# Patient Record
Sex: Female | Born: 1945
Health system: Southern US, Community
[De-identification: ages and names within clinical notes are randomized; demographics above are authoritative.]

## PROBLEM LIST (undated history)

## (undated) DIAGNOSIS — I639 Cerebral infarction, unspecified: Secondary | ICD-10-CM

## (undated) DIAGNOSIS — M25569 Pain in unspecified knee: Secondary | ICD-10-CM

## (undated) DIAGNOSIS — I10 Essential (primary) hypertension: Secondary | ICD-10-CM

## (undated) DIAGNOSIS — E119 Type 2 diabetes mellitus without complications: Secondary | ICD-10-CM

## (undated) DIAGNOSIS — M549 Dorsalgia, unspecified: Secondary | ICD-10-CM

## (undated) HISTORY — DX: Cerebral infarction, unspecified: I63.9

---

## 2018-07-09 ENCOUNTER — Encounter (HOSPITAL_COMMUNITY): Payer: Self-pay | Admitting: Emergency Medicine

## 2018-07-09 ENCOUNTER — Emergency Department (HOSPITAL_COMMUNITY): Payer: Medicare Other

## 2018-07-09 ENCOUNTER — Inpatient Hospital Stay (HOSPITAL_COMMUNITY)
Admission: EM | Admit: 2018-07-09 | Discharge: 2018-07-13 | DRG: 065 | Disposition: A | Discharge status: Rehabilitation Facility | Payer: Medicare Other | Attending: Family Medicine | Admitting: Family Medicine

## 2018-07-09 ENCOUNTER — Other Ambulatory Visit: Payer: Self-pay

## 2018-07-09 DIAGNOSIS — E1151 Type 2 diabetes mellitus with diabetic peripheral angiopathy without gangrene: Secondary | ICD-10-CM | POA: Diagnosis present

## 2018-07-09 DIAGNOSIS — R4781 Slurred speech: Secondary | ICD-10-CM | POA: Diagnosis present

## 2018-07-09 DIAGNOSIS — E785 Hyperlipidemia, unspecified: Secondary | ICD-10-CM | POA: Diagnosis present

## 2018-07-09 DIAGNOSIS — Z87891 Personal history of nicotine dependence: Secondary | ICD-10-CM

## 2018-07-09 DIAGNOSIS — E1165 Type 2 diabetes mellitus with hyperglycemia: Secondary | ICD-10-CM | POA: Diagnosis present

## 2018-07-09 DIAGNOSIS — R739 Hyperglycemia, unspecified: Secondary | ICD-10-CM

## 2018-07-09 DIAGNOSIS — G8191 Hemiplegia, unspecified affecting right dominant side: Secondary | ICD-10-CM | POA: Diagnosis present

## 2018-07-09 DIAGNOSIS — I639 Cerebral infarction, unspecified: Secondary | ICD-10-CM | POA: Diagnosis not present

## 2018-07-09 DIAGNOSIS — I160 Hypertensive urgency: Secondary | ICD-10-CM | POA: Diagnosis present

## 2018-07-09 DIAGNOSIS — R29810 Facial weakness: Secondary | ICD-10-CM | POA: Diagnosis present

## 2018-07-09 DIAGNOSIS — R531 Weakness: Secondary | ICD-10-CM

## 2018-07-09 HISTORY — DX: Pain in unspecified knee: M25.569

## 2018-07-09 HISTORY — DX: Dorsalgia, unspecified: M54.9

## 2018-07-09 HISTORY — DX: Essential (primary) hypertension: I10

## 2018-07-09 LAB — DIFFERENTIAL
Abs Immature Granulocytes: 0 10*3/uL (ref 0.0–0.1)
BASOS PCT: 1 %
Basophils Absolute: 0.1 10*3/uL (ref 0.0–0.1)
EOS ABS: 0.3 10*3/uL (ref 0.0–0.7)
EOS PCT: 2 %
Immature Granulocytes: 0 %
Lymphocytes Relative: 24 %
Lymphs Abs: 2.8 10*3/uL (ref 0.7–4.0)
MONO ABS: 0.8 10*3/uL (ref 0.1–1.0)
MONOS PCT: 7 %
NEUTROS PCT: 66 %
Neutro Abs: 7.6 10*3/uL (ref 1.7–7.7)

## 2018-07-09 LAB — COMPREHENSIVE METABOLIC PANEL
ALT: 18 U/L (ref 0–44)
AST: 14 U/L — ABNORMAL LOW (ref 15–41)
Albumin: 3.5 g/dL (ref 3.5–5.0)
Alkaline Phosphatase: 102 U/L (ref 38–126)
Anion gap: 12 (ref 5–15)
BUN: 9 mg/dL (ref 8–23)
CHLORIDE: 103 mmol/L (ref 98–111)
CO2: 25 mmol/L (ref 22–32)
CREATININE: 0.93 mg/dL (ref 0.44–1.00)
Calcium: 9.2 mg/dL (ref 8.9–10.3)
Glucose, Bld: 217 mg/dL — ABNORMAL HIGH (ref 70–99)
POTASSIUM: 3.7 mmol/L (ref 3.5–5.1)
Sodium: 140 mmol/L (ref 135–145)
Total Bilirubin: 1.2 mg/dL (ref 0.3–1.2)
Total Protein: 6.8 g/dL (ref 6.5–8.1)

## 2018-07-09 LAB — I-STAT CHEM 8, ED
BUN: 9 mg/dL (ref 8–23)
CREATININE: 0.8 mg/dL (ref 0.44–1.00)
Calcium, Ion: 1.15 mmol/L (ref 1.15–1.40)
Chloride: 102 mmol/L (ref 98–111)
Glucose, Bld: 211 mg/dL — ABNORMAL HIGH (ref 70–99)
HEMATOCRIT: 46 % (ref 36.0–46.0)
HEMOGLOBIN: 15.6 g/dL — AB (ref 12.0–15.0)
Potassium: 3.6 mmol/L (ref 3.5–5.1)
Sodium: 140 mmol/L (ref 135–145)
TCO2: 27 mmol/L (ref 22–32)

## 2018-07-09 LAB — I-STAT TROPONIN, ED: TROPONIN I, POC: 0.01 ng/mL (ref 0.00–0.08)

## 2018-07-09 LAB — CBC
HCT: 46.7 % — ABNORMAL HIGH (ref 36.0–46.0)
Hemoglobin: 13.5 g/dL (ref 12.0–15.0)
MCH: 21.8 pg — AB (ref 26.0–34.0)
MCHC: 28.9 g/dL — AB (ref 30.0–36.0)
MCV: 75.6 fL — ABNORMAL LOW (ref 78.0–100.0)
PLATELETS: 219 10*3/uL (ref 150–400)
RBC: 6.18 MIL/uL — ABNORMAL HIGH (ref 3.87–5.11)
RDW: 13.5 % (ref 11.5–15.5)
WBC: 11.6 10*3/uL — ABNORMAL HIGH (ref 4.0–10.5)

## 2018-07-09 LAB — PROTIME-INR
INR: 0.97
PROTHROMBIN TIME: 12.8 s (ref 11.4–15.2)

## 2018-07-09 LAB — ETHANOL: Alcohol, Ethyl (B): <10 mg/dL (ref <10)

## 2018-07-09 LAB — CBG MONITORING, ED: Glucose-Capillary: 194 mg/dL — ABNORMAL HIGH (ref 70–99)

## 2018-07-09 LAB — APTT: APTT: 27 s (ref 24–36)

## 2018-07-09 NOTE — ED Provider Notes (Signed)
[facility] Provider Note   CSN: [id number] Arrival date & time: 07/09/18  1939    Level 5 caveat: Language barrier: Translator was used History   Education officer, museumChief Complaint Chief Complaint  Patient presents with  . Stroke Symptoms    HPI Samantha Paul is a 72 y.o. female.  HPI Patient presents to the emergency room for evaluation of right arm and leg weakness.  Patient started having some symptoms since at least Saturday evening.  Patient is having difficulty holding her right arm up.  She also has difficulty moving her leg when she is trying to walk.  Her daughter states that her speech is also somewhat slurred.  She denies any pain.  Denies any injuries.  She denies any numbness.  No history of previous symptoms.  Patient denies any other complaints. History reviewed. No pertinent past medical history.  There are no active problems to display for this patient.   History reviewed. No pertinent surgical history.   OB History   None      Home Medications    Prior to Admission medications   Not on File    Family History No family history on file.  Social History Social History   Tobacco Use  . Smoking status: Never Smoker  . Smokeless tobacco: Never Used  Substance Use Topics  . Alcohol use: Not on file  . Drug use: Not on file     Allergies   Patient has no known allergies.   Review of Systems Review of Systems  All other systems reviewed and are negative.    Physical Exam Updated Vital Signs BP (!) 181/87   Pulse 77   Temp 98.6 F (37 C) (Oral)   Resp 20   SpO2 99%   Physical Exam  Constitutional: She is oriented to person, place, and time. She appears well-developed and well-nourished. No distress.  HENT:  Head: Normocephalic and atraumatic.  Right Ear: External ear normal.  Left Ear: External ear normal.  Mouth/Throat: Oropharynx is clear and moist.  Eyes: Conjunctivae are normal. Right eye exhibits no  discharge. Left eye exhibits no discharge. No scleral icterus.  Neck: Neck supple. No tracheal deviation present.  Cardiovascular: Normal rate, regular rhythm and intact distal pulses.  Pulmonary/Chest: Effort normal and breath sounds normal. No stridor. No respiratory distress. She has no wheezes. She has no rales.  Abdominal: Soft. Bowel sounds are normal. She exhibits no distension. There is no tenderness. There is no rebound and no guarding.  Musculoskeletal: She exhibits no edema or tenderness.  Neurological: She is alert and oriented to person, place, and time. No cranial nerve deficit (no facial droop, extraocular movements intact, no slurred speech) or sensory deficit. She exhibits normal muscle tone. She displays no seizure activity. Coordination normal.  Patient is unable to lift her right arm off the bed, unable to hold right leg off bed for 5 seconds, sensation intact in all extremities, no visual field cuts, no left or right sided neglect, normal finger-nose exam bilaterally, no nystagmus noted   Skin: Skin is warm and dry. No rash noted.  Psychiatric: She has a normal mood and affect.  Nursing note and vitals reviewed.    ED Treatments / Results  Labs (all labs ordered are listed, but only abnormal results are displayed) Labs Reviewed  CBC - Abnormal; Notable for the following components:      Result Value   WBC 11.6 (*)    RBC 6.18 (*)  HCT 46.7 (*)    MCV 75.6 (*)    MCH 21.8 (*)    MCHC 28.9 (*)    All other components within normal limits  COMPREHENSIVE METABOLIC PANEL - Abnormal; Notable for the following components:   Glucose, Bld 217 (*)    AST 14 (*)    All other components within normal limits  CBG MONITORING, ED - Abnormal; Notable for the following components:   Glucose-Capillary 194 (*)    All other components within normal limits  I-STAT CHEM 8, ED - Abnormal; Notable for the following components:   Glucose, Bld 211 (*)    Hemoglobin 15.6 (*)    All  other components within normal limits  PROTIME-INR  APTT  DIFFERENTIAL  ETHANOL  RAPID URINE DRUG SCREEN, HOSP PERFORMED  URINALYSIS, ROUTINE W REFLEX MICROSCOPIC  I-STAT TROPONIN, ED    EKG EKG Interpretation  Date/Time:  Monday July 09 2018 21:14:43 EDT Ventricular Rate:  87 PR Interval:  138 QRS Duration: 74 QT Interval:  372 QTC Calculation: 447 R Axis:   -22 Text Interpretation:  Normal sinus rhythm Nonspecific ST and T wave abnormality Abnormal ECG No old tracing to compare Confirmed by Linwood Dibbles [phone]) on 07/09/2018 9:23:35 PM   Radiology Ct Head Wo Contrast  Result Date: 07/09/2018 CLINICAL DATA:  Patient presents with right arm weakness and right leg weakness with right facial droop. Grand daughter also states that her speech is not her usual. EXAM: CT HEAD WITHOUT CONTRAST TECHNIQUE: Contiguous axial images were obtained from the base of the skull through the vertex without intravenous contrast. COMPARISON:  None. FINDINGS: Brain: Age related involutional changes of the brain. Age-indeterminate hypodensity involving the pons on the left. Age-indeterminate small vessel ischemic disease of periventricular and subcortical white matter. Idiopathic bilateral basal ganglial calcifications are seen. No intra-axial mass nor extra-axial fluid collections. No acute intracranial hemorrhage. Midline fourth ventricle and basal cisterns without effacement. Vascular: No hyperdense vessel sign. Moderate atherosclerosis of the carotid siphons. Skull: Intact Sinuses/Orbits: Nonacute Other: None IMPRESSION: Age-indeterminate infarct of the left pons believed to account for ill-defined hypodensity noted within. Atrophy with age-indeterminate small vessel ischemic disease is also noted though more likely to be chronic given atherosclerosis at the skull base. MRI may prove useful for further correlation. Electronically Signed   By: Tollie Eth M.D.   On: 07/09/2018 23:54    Procedures Procedures  (including critical care time)  Medications Ordered in ED Medications - No data to display   Initial Impression / Assessment and Plan / ED Course  I have reviewed the triage vital signs and the nursing notes.  Pertinent labs & imaging results that were available during my care of the patient were reviewed by me and considered in my medical decision making (see chart for details).  Clinical Course as of Jul 16 0001  Tue Jul 10, 2018  0000 Laboratory tests are notable for hyperglycemia.  CT scan shows age-indeterminate stroke in the left pons.   [JK]    Clinical Course User Index [JK] Linwood Dibbles, MD  Patient presented to the emergency room for evaluation of symptoms concerning for an acute stroke.  CT scan does show findings concerning for stroke.  Patient clearly has deficits of right arm.  She is outside of any intervention window as the symptoms started Saturday evening.  I will consult for admission for further treatment and evaluation.   Final Clinical Impressions(s) / ED Diagnoses   Final diagnoses:  Cerebral infarction, unspecified mechanism (  HCC)      Linwood Dibbles, MD 07/10/18 0001

## 2018-07-09 NOTE — ED Notes (Signed)
Back from CT.  Family is at bedside 

## 2018-07-09 NOTE — ED Triage Notes (Signed)
Patient here with right arm weakness and right leg weakness.  She has been having these symptoms since Saturday evening.  She is unable to hold right arm up, with inability to walk due to right leg weakness.  She denies any pain.  She has right facial droop.  Granddaughter states that her speech is off, not normal.

## 2018-07-10 ENCOUNTER — Inpatient Hospital Stay (HOSPITAL_COMMUNITY): Payer: Medicare Other

## 2018-07-10 ENCOUNTER — Encounter (HOSPITAL_COMMUNITY): Payer: Self-pay | Admitting: Internal Medicine

## 2018-07-10 ENCOUNTER — Other Ambulatory Visit: Payer: Self-pay

## 2018-07-10 DIAGNOSIS — I160 Hypertensive urgency: Secondary | ICD-10-CM

## 2018-07-10 DIAGNOSIS — I5030 Unspecified diastolic (congestive) heart failure: Secondary | ICD-10-CM | POA: Diagnosis not present

## 2018-07-10 DIAGNOSIS — R531 Weakness: Secondary | ICD-10-CM

## 2018-07-10 DIAGNOSIS — I6302 Cerebral infarction due to thrombosis of basilar artery: Secondary | ICD-10-CM | POA: Diagnosis not present

## 2018-07-10 DIAGNOSIS — E785 Hyperlipidemia, unspecified: Secondary | ICD-10-CM

## 2018-07-10 DIAGNOSIS — D62 Acute posthemorrhagic anemia: Secondary | ICD-10-CM | POA: Diagnosis not present

## 2018-07-10 DIAGNOSIS — I6350 Cerebral infarction due to unspecified occlusion or stenosis of unspecified cerebral artery: Secondary | ICD-10-CM | POA: Diagnosis not present

## 2018-07-10 DIAGNOSIS — R29810 Facial weakness: Secondary | ICD-10-CM | POA: Diagnosis present

## 2018-07-10 DIAGNOSIS — Z87891 Personal history of nicotine dependence: Secondary | ICD-10-CM | POA: Diagnosis not present

## 2018-07-10 DIAGNOSIS — R4781 Slurred speech: Secondary | ICD-10-CM | POA: Diagnosis present

## 2018-07-10 DIAGNOSIS — E119 Type 2 diabetes mellitus without complications: Secondary | ICD-10-CM | POA: Diagnosis not present

## 2018-07-10 DIAGNOSIS — R739 Hyperglycemia, unspecified: Secondary | ICD-10-CM

## 2018-07-10 DIAGNOSIS — R195 Other fecal abnormalities: Secondary | ICD-10-CM | POA: Diagnosis not present

## 2018-07-10 DIAGNOSIS — I639 Cerebral infarction, unspecified: Secondary | ICD-10-CM | POA: Diagnosis present

## 2018-07-10 DIAGNOSIS — I69351 Hemiplegia and hemiparesis following cerebral infarction affecting right dominant side: Secondary | ICD-10-CM | POA: Diagnosis not present

## 2018-07-10 DIAGNOSIS — G8191 Hemiplegia, unspecified affecting right dominant side: Secondary | ICD-10-CM | POA: Diagnosis present

## 2018-07-10 DIAGNOSIS — I10 Essential (primary) hypertension: Secondary | ICD-10-CM | POA: Diagnosis not present

## 2018-07-10 DIAGNOSIS — E1165 Type 2 diabetes mellitus with hyperglycemia: Secondary | ICD-10-CM | POA: Diagnosis present

## 2018-07-10 DIAGNOSIS — E1151 Type 2 diabetes mellitus with diabetic peripheral angiopathy without gangrene: Secondary | ICD-10-CM | POA: Diagnosis present

## 2018-07-10 LAB — BASIC METABOLIC PANEL
ANION GAP: 10 (ref 5–15)
BUN: 10 mg/dL (ref 8–23)
CALCIUM: 8.8 mg/dL — AB (ref 8.9–10.3)
CO2: 26 mmol/L (ref 22–32)
Chloride: 106 mmol/L (ref 98–111)
Creatinine, Ser: 0.87 mg/dL (ref 0.44–1.00)
GFR calc Af Amer: >60 mL/min (ref >60)
Glucose, Bld: 211 mg/dL — ABNORMAL HIGH (ref 70–99)
Potassium: 3.7 mmol/L (ref 3.5–5.1)
Sodium: 142 mmol/L (ref 135–145)

## 2018-07-10 LAB — LIPID PANEL
CHOL/HDL RATIO: 5.4 ratio
Cholesterol: 209 mg/dL — ABNORMAL HIGH (ref 0–200)
HDL: 39 mg/dL — AB (ref >40)
LDL CALC: 140 mg/dL — AB (ref 0–99)
Triglycerides: 148 mg/dL (ref <150)
VLDL: 30 mg/dL (ref 0–40)

## 2018-07-10 LAB — GLUCOSE, CAPILLARY
GLUCOSE-CAPILLARY: 222 mg/dL — AB (ref 70–99)
GLUCOSE-CAPILLARY: 234 mg/dL — AB (ref 70–99)
GLUCOSE-CAPILLARY: 213 mg/dL — AB (ref 70–99)
GLUCOSE-CAPILLARY: 273 mg/dL — AB (ref 70–99)
Glucose-Capillary: 181 mg/dL — ABNORMAL HIGH (ref 70–99)

## 2018-07-10 LAB — CBC WITH DIFFERENTIAL/PLATELET
Abs Immature Granulocytes: 0 10*3/uL (ref 0.0–0.1)
BASOS PCT: 1 %
Basophils Absolute: 0.1 10*3/uL (ref 0.0–0.1)
EOS PCT: 3 %
Eosinophils Absolute: 0.3 10*3/uL (ref 0.0–0.7)
HEMATOCRIT: 42.6 % (ref 36.0–46.0)
Hemoglobin: 12.5 g/dL (ref 12.0–15.0)
IMMATURE GRANULOCYTES: 0 %
LYMPHS ABS: 2.4 10*3/uL (ref 0.7–4.0)
Lymphocytes Relative: 25 %
MCH: 22 pg — AB (ref 26.0–34.0)
MCHC: 29.3 g/dL — ABNORMAL LOW (ref 30.0–36.0)
MCV: 75.1 fL — AB (ref 78.0–100.0)
MONO ABS: 0.7 10*3/uL (ref 0.1–1.0)
MONOS PCT: 8 %
Neutro Abs: 6.3 10*3/uL (ref 1.7–7.7)
Neutrophils Relative %: 63 %
Platelets: 174 10*3/uL (ref 150–400)
RBC: 5.67 MIL/uL — ABNORMAL HIGH (ref 3.87–5.11)
RDW: 13.4 % (ref 11.5–15.5)
WBC: 9.8 10*3/uL (ref 4.0–10.5)

## 2018-07-10 LAB — ECHOCARDIOGRAM COMPLETE
HEIGHTINCHES: 58 in
Weight: 2077.62 oz

## 2018-07-10 LAB — MRSA PCR SCREENING: MRSA by PCR: NEGATIVE

## 2018-07-10 LAB — HIV ANTIBODY (ROUTINE TESTING W REFLEX): HIV SCREEN 4TH GENERATION: NONREACTIVE

## 2018-07-10 MED ORDER — ACETAMINOPHEN 160 MG/5ML PO SOLN
650.0000 mg | ORAL | Status: DC | PRN
Start: 2018-07-10 — End: 2018-07-13

## 2018-07-10 MED ORDER — CLOPIDOGREL BISULFATE 75 MG PO TABS
75.0000 mg | ORAL_TABLET | Freq: Every day | ORAL | Status: DC
  Administered 2018-07-10 – 2018-07-13 (×4): 75 mg via ORAL
  Filled 2018-07-10 (×4): qty 1

## 2018-07-10 MED ORDER — SENNOSIDES-DOCUSATE SODIUM 8.6-50 MG PO TABS: 1.0000 | ORAL_TABLET | Freq: Every evening | ORAL | Status: DC | PRN

## 2018-07-10 MED ORDER — STROKE: EARLY STAGES OF RECOVERY BOOK
Freq: Once | Status: AC
  Administered 2018-07-10: 04:00:00
  Filled 2018-07-10: qty 1

## 2018-07-10 MED ORDER — ACETAMINOPHEN 650 MG RE SUPP: 650.0000 mg | RECTAL | Status: DC | PRN

## 2018-07-10 MED ORDER — INSULIN ASPART 100 UNIT/ML ~~LOC~~ SOLN
0.0000 [IU] | Freq: Every day | SUBCUTANEOUS | Status: DC
  Administered 2018-07-10: 5 [IU] via SUBCUTANEOUS
  Administered 2018-07-11: 2 [IU] via SUBCUTANEOUS

## 2018-07-10 MED ORDER — AMLODIPINE BESYLATE 10 MG PO TABS
10.0000 mg | ORAL_TABLET | Freq: Every day | ORAL | Status: DC
  Administered 2018-07-10 – 2018-07-13 (×4): 10 mg via ORAL
  Filled 2018-07-10 (×4): qty 1

## 2018-07-10 MED ORDER — ENOXAPARIN SODIUM 40 MG/0.4ML ~~LOC~~ SOLN
40.0000 mg | SUBCUTANEOUS | Status: DC
  Administered 2018-07-10 – 2018-07-13 (×4): 40 mg via SUBCUTANEOUS
  Filled 2018-07-10 (×4): qty 0.4

## 2018-07-10 MED ORDER — IOPAMIDOL (ISOVUE-370) INJECTION 76%
INTRAVENOUS | Status: AC
  Administered 2018-07-10: 50 mL
  Filled 2018-07-10: qty 50

## 2018-07-10 MED ORDER — INSULIN ASPART 100 UNIT/ML ~~LOC~~ SOLN
0.0000 [IU] | Freq: Three times a day (TID) | SUBCUTANEOUS | Status: DC
  Administered 2018-07-10 (×2): 3 [IU] via SUBCUTANEOUS
  Administered 2018-07-11: 2 [IU] via SUBCUTANEOUS
  Administered 2018-07-11: 5 [IU] via SUBCUTANEOUS
  Administered 2018-07-11: 3 [IU] via SUBCUTANEOUS
  Administered 2018-07-12: 1 [IU] via SUBCUTANEOUS
  Administered 2018-07-12: 2 [IU] via SUBCUTANEOUS
  Administered 2018-07-12: 5 [IU] via SUBCUTANEOUS
  Administered 2018-07-13: 2 [IU] via SUBCUTANEOUS

## 2018-07-10 MED ORDER — SODIUM CHLORIDE 0.9 % IV SOLN
INTRAVENOUS | Status: DC
  Administered 2018-07-10: 75 mL via INTRAVENOUS
  Administered 2018-07-11 – 2018-07-12 (×4): via INTRAVENOUS

## 2018-07-10 MED ORDER — ACETAMINOPHEN 325 MG PO TABS
650.0000 mg | ORAL_TABLET | ORAL | Status: DC | PRN
Start: 2018-07-10 — End: 2018-07-13
  Administered 2018-07-10: 650 mg via ORAL
  Filled 2018-07-10: qty 2

## 2018-07-10 MED ORDER — ASPIRIN 325 MG PO TABS
325.0000 mg | ORAL_TABLET | Freq: Once | ORAL | Status: AC
  Administered 2018-07-10: 325 mg via ORAL
  Filled 2018-07-10: qty 1

## 2018-07-10 MED ORDER — ASPIRIN EC 81 MG PO TBEC
81.0000 mg | DELAYED_RELEASE_TABLET | Freq: Every day | ORAL | Status: DC
  Administered 2018-07-10 – 2018-07-13 (×4): 81 mg via ORAL
  Filled 2018-07-10 (×4): qty 1

## 2018-07-10 MED ORDER — ATORVASTATIN CALCIUM 40 MG PO TABS
40.0000 mg | ORAL_TABLET | Freq: Every day | ORAL | Status: DC
  Administered 2018-07-10 – 2018-07-12 (×3): 40 mg via ORAL
  Filled 2018-07-10 (×3): qty 1

## 2018-07-10 MED ORDER — HYDRALAZINE HCL 20 MG/ML IJ SOLN
10.0000 mg | INTRAMUSCULAR | Status: DC | PRN
  Administered 2018-07-10: 10 mg via INTRAVENOUS
  Filled 2018-07-10: qty 1

## 2018-07-10 NOTE — ED Notes (Signed)
Pt care assumed, obtained verbal report.  Family is at bedside.  Denies complaints

## 2018-07-10 NOTE — Plan of Care (Signed)
  Problem: Education: Goal: Knowledge of secondary prevention will improve Outcome: Progressing   Problem: Coping: Goal: Will identify appropriate support needs Outcome: Progressing

## 2018-07-10 NOTE — Consult Note (Signed)
Referring Physician: Dr. Tamala Julian    Chief Complaint: Right sided weakness  HPI: Samantha Paul is an 72 y.o. female non-English speaking from [place] who presents with 2 day history of right sided weakness. Symptoms began suddenly on Saturday. The family is able to provide some language interpretation. They state that she has had some mild right facial droop and slurred speech, but no difficulty understanding language or putting sentences together.  Per ED note: "Patient presents to the emergency room for evaluation of right arm and leg weakness.  Patient started having some symptoms since at least Saturday evening.  Patient is having difficulty holding her right arm up.  She also has difficulty moving her leg when she is trying to walk.  Her daughter states that her speech is also somewhat slurred.  She denies any pain.  Denies any injuries.  She denies any numbness.  No history of previous symptoms.  Patient denies any other complaints."  History reviewed. No pertinent past medical history.  History reviewed. No pertinent surgical history.  History reviewed. No pertinent family history. Social History:  reports that she has never smoked. She has never used smokeless tobacco. Her alcohol and drug histories are not on file.  Allergies: No Known Allergies  Medications: No home medications per family  ROS: As per HPI.   Physical Examination: Blood pressure (!) 195/74, pulse 88, temperature 98.7 F (37.1 C), resp. rate 17, height 4' 10"  (1.473 m), weight 58.9 kg (129 lb 13.6 oz), SpO2 100 %.  HEENT: [place]/AT Lungs: Respirations unlabored Ext: No edema  Neurologic Examination: Mental Status: Alert, oriented per family providing interpretation. Thought content appropriate.  Speech fluent with intact comprehension to questions and commands. No apparent hemineglect.  Cranial Nerves: II:  Visual fields intact to threat bilaterally. PERRL.  III,IV, VI: EOMI without nystagmus. No ptosis.  V,VII: Subtle  right facial droop. Facial light touch sensation subjectively normal bilaterally. VIII: Hearing intact to voice.  IX,X: No hypophonia XI: Head rotates to left and right XII: midline tongue extension  Motor: RUE 2/5 RLE 4/5 LUE and LLE 5/5 Sensory: Temp and light touch subjectively equal bilaterally. Positive for extinction on the right.  Deep Tendon Reflexes:  1+ reflexes on the right, 2+ on the left.  Plantars: Right: downgoing   Left: downgoing Cerebellar: No ataxia with left FNF. Unable to perform on the right due to weakness.  Gait: Deferred  Results for orders placed or performed during the hospital encounter of 07/09/18 (from the past 48 hour(s))  Protime-INR     Status: None   Collection Time: 07/09/18  9:12 PM  Result Value Ref Range   Prothrombin Time 12.8 11.4 - 15.2 seconds   INR 0.97     Comment: Performed at [facility] [address], [place], [place] [address]  APTT     Status: None   Collection Time: 07/09/18  9:12 PM  Result Value Ref Range   aPTT 27 24 - 36 seconds    Comment: Performed at [facility] [address], [place], [place] [address]  CBC     Status: Abnormal   Collection Time: 07/09/18  9:12 PM  Result Value Ref Range   WBC 11.6 (H) 4.0 - 10.5 K/uL   RBC 6.18 (H) 3.87 - 5.11 MIL/uL   Hemoglobin 13.5 12.0 - 15.0 g/dL   HCT 46.7 (H) 36.0 - 46.0 %   MCV 75.6 (L) 78.0 - 100.0 fL   MCH 21.8 (L) 26.0 -  34.0 pg   MCHC 28.9 (L) 30.0 - 36.0 g/dL   RDW 13.5 11.5 - 15.5 %   Platelets 219 150 - 400 K/uL    Comment: Performed at [facility], [place] [address], [place], [place] [address]  Differential     Status: None   Collection Time: 07/09/18  9:12 PM  Result Value Ref Range   Neutrophils Relative % 66 %   Neutro Abs 7.6 1.7 - 7.7 K/uL   Lymphocytes Relative 24 %   Lymphs Abs 2.8 0.7 - 4.0 K/uL   Monocytes Relative 7 %   Monocytes Absolute 0.8 0.1 - 1.0 K/uL   Eosinophils Relative 2 %   Eosinophils Absolute 0.3 0.0 - 0.7 K/uL    Basophils Relative 1 %   Basophils Absolute 0.1 0.0 - 0.1 K/uL   Immature Granulocytes 0 %   Abs Immature Granulocytes 0.0 0.0 - 0.1 K/uL    Comment: Performed at [facility] [address], [place], [place] [address]  Comprehensive metabolic panel     Status: Abnormal   Collection Time: 07/09/18  9:12 PM  Result Value Ref Range   Sodium 140 135 - 145 mmol/L   Potassium 3.7 3.5 - 5.1 mmol/L   Chloride 103 98 - 111 mmol/L    Comment: Please note change in reference range.   CO2 25 22 - 32 mmol/L   Glucose, Bld 217 (H) 70 - 99 mg/dL    Comment: Please note change in reference range.   BUN 9 8 - 23 mg/dL    Comment: Please note change in reference range.   Creatinine, Ser 0.93 0.44 - 1.00 mg/dL   Calcium 9.2 8.9 - 10.3 mg/dL   Total Protein 6.8 6.5 - 8.1 g/dL   Albumin 3.5 3.5 - 5.0 g/dL   AST 14 (L) 15 - 41 U/L   ALT 18 0 - 44 U/L    Comment: Please note change in reference range.   Alkaline Phosphatase 102 38 - 126 U/L   Total Bilirubin 1.2 0.3 - 1.2 mg/dL   GFR calc non Af Amer NOT CALCULATED >60 mL/min   GFR calc Af Amer NOT CALCULATED >60 mL/min    Comment: (NOTE) The eGFR has been calculated using the CKD EPI equation. This calculation has not been validated in all clinical situations. eGFR's persistently <60 mL/min signify possible Chronic Kidney Disease.    Anion gap 12 5 - 15    Comment: Performed at [facility] [address] [place], [place] [address]  Ethanol     Status: None   Collection Time: 07/09/18  9:15 PM  Result Value Ref Range   Alcohol, Ethyl (B) <10 <10 mg/dL    Comment: (NOTE) Lowest detectable limit for serum alcohol is 10 mg/dL. For medical purposes only. Performed at [facility], [place] [address], [place], [place] [address]   CBG monitoring, ED     Status: Abnormal   Collection Time: 07/09/18  9:23 PM  Result Value Ref Range   Glucose-Capillary 194 (H) 70 - 99 mg/dL  I-stat troponin, ED     Status: None   Collection  Time: 07/09/18  9:28 PM  Result Value Ref Range   Troponin i, poc 0.01 0.00 - 0.08 ng/mL   Comment 3            Comment: Due to the release kinetics of cTnI, a negative result within the first hours of the onset of symptoms  does not rule out myocardial infarction with certainty. If myocardial infarction is still suspected, repeat the test at appropriate intervals.   I-Stat Chem 8, ED     Status: Abnormal   Collection Time: 07/09/18  9:30 PM  Result Value Ref Range   Sodium 140 135 - 145 mmol/L   Potassium 3.6 3.5 - 5.1 mmol/L   Chloride 102 98 - 111 mmol/L   BUN 9 8 - 23 mg/dL   Creatinine, Ser 0.80 0.44 - 1.00 mg/dL   Glucose, Bld 211 (H) 70 - 99 mg/dL   Calcium, Ion 1.15 1.15 - 1.40 mmol/L   TCO2 27 22 - 32 mmol/L   Hemoglobin 15.6 (H) 12.0 - 15.0 g/dL   HCT 46.0 36.0 - 46.0 %  Lipid panel     Status: Abnormal   Collection Time: 07/10/18  3:32 AM  Result Value Ref Range   Cholesterol 209 (H) 0 - 200 mg/dL   Triglycerides 148 <150 mg/dL   HDL 39 (L) >40 mg/dL   Total CHOL/HDL Ratio 5.4 RATIO   VLDL 30 0 - 40 mg/dL   LDL Cholesterol 140 (H) 0 - 99 mg/dL    Comment:        Total Cholesterol/HDL:CHD Risk Coronary Heart Disease Risk Table                     Men   Women  1/2 Average Risk   3.4   3.3  Average Risk       5.0   4.4  2 X Average Risk   9.6   7.1  3 X Average Risk  23.4   11.0        Use the calculated Patient Ratio above and the CHD Risk Table to determine the patient's CHD Risk.        ATP III CLASSIFICATION (LDL):  <100     mg/dL   Optimal  100-129  mg/dL   Near or Above                    Optimal  130-159  mg/dL   Borderline  160-189  mg/dL   High  >190     mg/dL   Very High Performed at [facility] [address] [place], [place] [address]   CBC with Differential/Platelet     Status: Abnormal   Collection Time: 07/10/18  3:32 AM  Result Value Ref Range   WBC 9.8 4.0 - 10.5 K/uL   RBC 5.67 (H) 3.87 - 5.11 MIL/uL   Hemoglobin 12.5 12.0  - 15.0 g/dL   HCT 42.6 36.0 - 46.0 %   MCV 75.1 (L) 78.0 - 100.0 fL   MCH 22.0 (L) 26.0 - 34.0 pg   MCHC 29.3 (L) 30.0 - 36.0 g/dL   RDW 13.4 11.5 - 15.5 %   Platelets 174 150 - 400 K/uL   Neutrophils Relative % 63 %   Neutro Abs 6.3 1.7 - 7.7 K/uL   Lymphocytes Relative 25 %   Lymphs Abs 2.4 0.7 - 4.0 K/uL   Monocytes Relative 8 %   Monocytes Absolute 0.7 0.1 - 1.0 K/uL   Eosinophils Relative 3 %   Eosinophils Absolute 0.3 0.0 - 0.7 K/uL   Basophils Relative 1 %   Basophils Absolute 0.1 0.0 - 0.1 K/uL   Immature Granulocytes 0 %   Abs Immature Granulocytes 0.0 0.0 - 0.1 K/uL    Comment: Performed at [facility], [address] [place], [place] [address]  Basic metabolic panel     Status: Abnormal   Collection Time: 07/10/18  3:32 AM  Result Value Ref Range   Sodium 142 135 - 145 mmol/L   Potassium 3.7 3.5 - 5.1 mmol/L   Chloride 106 98 - 111 mmol/L    Comment: Please note change in reference range.   CO2 26 22 - 32 mmol/L   Glucose, Bld 211 (H) 70 - 99 mg/dL    Comment: Please note change in reference range.   BUN 10 8 - 23 mg/dL    Comment: Please note change in reference range.   Creatinine, Ser 0.87 0.44 - 1.00 mg/dL   Calcium 8.8 (L) 8.9 - 10.3 mg/dL   GFR calc non Af Amer >60 >60 mL/min   GFR calc Af Amer >60 >60 mL/min    Comment: (NOTE) The eGFR has been calculated using the CKD EPI equation. This calculation has not been validated in all clinical situations. eGFR's persistently <60 mL/min signify possible Chronic Kidney Disease.    Anion gap 10 5 - 15    Comment: Performed at [facility] [address], [place], [place] [address]   Dg Chest 2 View  Result Date: 07/10/2018 CLINICAL DATA:  72 year old female with CVA. EXAM: CHEST - 2 VIEW COMPARISON:  None. FINDINGS: The lungs are clear. There is no pleural effusion or pneumothorax. Mild cardiomegaly. Degenerative changes of the shoulders. No acute osseous pathology. IMPRESSION: No active  cardiopulmonary disease. Electronically Signed   By: Anner Crete M.D.   On: 07/10/2018 02:11   Ct Head Wo Contrast  Result Date: 07/09/2018 CLINICAL DATA:  Patient presents with right arm weakness and right leg weakness with right facial droop. Grand daughter also states that her speech is not her usual. EXAM: CT HEAD WITHOUT CONTRAST TECHNIQUE: Contiguous axial images were obtained from the base of the skull through the vertex without intravenous contrast. COMPARISON:  None. FINDINGS: Brain: Age related involutional changes of the brain. Age-indeterminate hypodensity involving the pons on the left. Age-indeterminate small vessel ischemic disease of periventricular and subcortical white matter. Idiopathic bilateral basal ganglial calcifications are seen. No intra-axial mass nor extra-axial fluid collections. No acute intracranial hemorrhage. Midline fourth ventricle and basal cisterns without effacement. Vascular: No hyperdense vessel sign. Moderate atherosclerosis of the carotid siphons. Skull: Intact Sinuses/Orbits: Nonacute Other: None IMPRESSION: Age-indeterminate infarct of the left pons believed to account for ill-defined hypodensity noted within. Atrophy with age-indeterminate small vessel ischemic disease is also noted though more likely to be chronic given atherosclerosis at the skull base. MRI may prove useful for further correlation. Electronically Signed   By: Ashley Royalty M.D.   On: 07/09/2018 23:54    Assessment: 72 y.o. female with two day history of right hemiparesis, arm worse than leg and face 1. Overall clinical picture most consistent with ischemic stroke 2.  CT head reveals an age-indeterminate infarct of the left pons believed to account for ill-defined hypodensity noted within. Atrophy with age-indeterminate small vessel ischemic disease is also noted though more likely to be chronic given atherosclerosis at the skull base.  3. Stroke Risk Factors - No PMHx  Plan: 1. HgbA1c,  fasting lipid panel 2. MRI, MRA of the brain without contrast 3. PT consult, OT consult, Speech consult 4. Echocardiogram 5. Carotid dopplers 6. Prophylactic therapy- ASA 81 mg po qd 7. Risk factor modification 8. Telemetry monitoring 9. Frequent neuro checks 10. Initiate atorvastatin 40 mg  po qd, provided that LFTs and CK are normal.  11. Out of permissive HTN time window.    @Electronically  signed: Dr. Kerney Elbe 07/10/2018, 5:48 AM

## 2018-07-10 NOTE — Progress Notes (Signed)
Patient seen and examined this morning, admitted overnight by Dr. Katrinka Paul, H&P reviewed and agree with the assessment and plan.  In brief this is a Samantha Paul speaking female with no significant past medical history but not having regular medical care who was admitted to the hospital due to inability to move her right arm as well as right leg weakness.  Per family she has had some slurred speech as well.  This is been going on for 2 to 3 days.  She was admitted to the hospital for CVA work-up.  Neurology was consulted   Acute CVA -CT scan showed age-indeterminate left pons infarct, MRI pending -Continue full stroke work-up, appreciate neurology evaluation, MRI, 2D echo, carotid ultrasound pending -Continue aspirin atorvastatin  Hypertensive urgency -Out of the window for permissive hypertension, hydralazine as needed  Hyperglycemia -Unknown if she has diabetes, check A1c   Difficult communication due to language barrier  Samantha M. Elvera LennoxGherghe, MD [facility] [phone]  If 7PM-7AM, please contact night-coverage [email or web address] Password [email or web address]

## 2018-07-10 NOTE — H&P (Signed)
History and Physical    Kyndell Tuckerman AOZ:[id number] DOB: [birth year] DOA: 07/09/2018  Referring MD/NP/PA: Linwood Dibbles, MD PCP: Patient, No Pcp Per  Patient coming from: Home  Chief Complaint: Right-sided weakness  I have personally briefly reviewed patient's old medical records in [facility]   HPI: Emeline Mcgourty is a 72 y.o. [place] speaking female without significant past medical history; who presents with reports of 2 days of right-sided weakness.  History is limited due to language barrier and interpreter services unavailable at this time.  Patient's granddaughter tries to help translate.  Patient has been unable to move her right arm,  has had right leg weakness, unable to walk without assistance, and slurred speech. Other associated symptoms include some joint pains. Patient denies any other complaints.  She does not regularly see a doctor and notes no significant family history of issues reported that her parents died of old age.  ED Course: Upon admission to the emergency department patient was seen to have blood pressure as high as 193/89.  CT scan of the brain revealed an age-indeterminate left pontine infarct.  Labs revealed WBC 11.6 and glucose 217.  Patient was out of the window for TPA.  Review of Systems  Constitutional: Negative for chills, fever and weight loss.  HENT: Negative for ear discharge and nosebleeds.   Eyes: Negative for photophobia and pain.  Respiratory: Negative for cough and hemoptysis.   Cardiovascular: Negative for chest pain and orthopnea.  Gastrointestinal: Negative for abdominal pain, nausea and vomiting.  Genitourinary: Negative for dysuria and hematuria.  Musculoskeletal: Positive for joint pain. Negative for myalgias.  Skin: Negative for itching and rash.  Neurological: Positive for speech change and focal weakness. Negative for weakness.  Psychiatric/Behavioral: Negative for memory loss and substance abuse.    History reviewed. No pertinent past medical  history.  History reviewed. No pertinent surgical history.   reports that she has never smoked. She has never used smokeless tobacco. Her alcohol and drug histories are not on file.  No Known Allergies  History reviewed. No pertinent family history.  Prior to Admission medications   Not on File    Physical Exam:  Constitutional: NAD, calm, comfortable Vitals:   07/09/18 2000 07/09/18 2157 07/09/18 2200 07/09/18 2215  BP: (!) 183/94 (!) 187/79 (!) 193/89 (!) 181/87  Pulse: 82 81 82 77  Resp: 18 17 20 20   Temp: 98.6 F (37 C)     TempSrc: Oral     SpO2: 100% 100% 99% 99%   Eyes: PERRL, lids and conjunctivae normal ENMT: Mucous membranes are moist. Posterior pharynx clear of any exudate or lesions.   Neck: normal, supple, no masses, no thyromegaly Respiratory: clear to auscultation bilaterally, no wheezing, no crackles. Normal respiratory effort. No accessory muscle use.  Cardiovascular: Regular rate and rhythm, no murmurs / rubs / gallops. No extremity edema. 2+ pedal pulses. No carotid bruits.  Abdomen: no tenderness, no masses palpated. No hepatosplenomegaly. Bowel sounds positive.  Musculoskeletal: no clubbing / cyanosis. No joint deformity upper and lower extremities. Good ROM, no contractures. Normal muscle tone.  Skin: no rashes, lesions, ulcers. No induration Neurologic: Right-sided facial droop present, right upper extremity strength 0/5, right lower extremity 3/5, and left upper and lower extremity 5/5. Psychiatric: Normal judgment and insight. Alert and oriented x 3. Normal mood.     Labs on Admission: I have personally reviewed following labs and imaging studies  CBC: Recent Labs  Lab 07/09/18 2112 07/09/18 2130  WBC 11.6*  --  NEUTROABS 7.6  --   HGB 13.5 15.6*  HCT 46.7* 46.0  MCV 75.6*  --   PLT 219  --    Basic Metabolic Panel: Recent Labs  Lab 07/09/18 2112 07/09/18 2130  NA 140 140  K 3.7 3.6  CL 103 102  CO2 25  --   GLUCOSE 217* 211*    BUN 9 9  CREATININE 0.93 0.80  CALCIUM 9.2  --    GFR: CrCl cannot be calculated (Unknown ideal weight.). Liver Function Tests: Recent Labs  Lab 07/09/18 2112  AST 14*  ALT 18  ALKPHOS 102  BILITOT 1.2  PROT 6.8  ALBUMIN 3.5   No results for input(s): LIPASE, AMYLASE in the last 168 hours. No results for input(s): AMMONIA in the last 168 hours. Coagulation Profile: Recent Labs  Lab 07/09/18 2112  INR 0.97   Cardiac Enzymes: No results for input(s): CKTOTAL, CKMB, CKMBINDEX, TROPONINI in the last 168 hours. BNP (last 3 results) No results for input(s): PROBNP in the last 8760 hours. HbA1C: No results for input(s): HGBA1C in the last 72 hours. CBG: Recent Labs  Lab 07/09/18 2123  GLUCAP 194*   Lipid Profile: No results for input(s): CHOL, HDL, LDLCALC, TRIG, CHOLHDL, LDLDIRECT in the last 72 hours. Thyroid Function Tests: No results for input(s): TSH, T4TOTAL, FREET4, T3FREE, THYROIDAB in the last 72 hours. Anemia Panel: No results for input(s): VITAMINB12, FOLATE, FERRITIN, TIBC, IRON, RETICCTPCT in the last 72 hours. Urine analysis: No results found for: COLORURINE, APPEARANCEUR, LABSPEC, PHURINE, GLUCOSEU, HGBUR, BILIRUBINUR, KETONESUR, PROTEINUR, UROBILINOGEN, NITRITE, LEUKOCYTESUR Sepsis Labs: No results found for this or any previous visit (from the past 240 hour(s)).   Radiological Exams on Admission: Ct Head Wo Contrast  Result Date: 07/09/2018 CLINICAL DATA:  Patient presents with right arm weakness and right leg weakness with right facial droop. Grand daughter also states that her speech is not her usual. EXAM: CT HEAD WITHOUT CONTRAST TECHNIQUE: Contiguous axial images were obtained from the base of the skull through the vertex without intravenous contrast. COMPARISON:  None. FINDINGS: Brain: Age related involutional changes of the brain. Age-indeterminate hypodensity involving the pons on the left. Age-indeterminate small vessel ischemic disease of  periventricular and subcortical white matter. Idiopathic bilateral basal ganglial calcifications are seen. No intra-axial mass nor extra-axial fluid collections. No acute intracranial hemorrhage. Midline fourth ventricle and basal cisterns without effacement. Vascular: No hyperdense vessel sign. Moderate atherosclerosis of the carotid siphons. Skull: Intact Sinuses/Orbits: Nonacute Other: None IMPRESSION: Age-indeterminate infarct of the left pons believed to account for ill-defined hypodensity noted within. Atrophy with age-indeterminate small vessel ischemic disease is also noted though more likely to be chronic given atherosclerosis at the skull base. MRI may prove useful for further correlation. Electronically Signed   By: Tollie Eth M.D.   On: 07/09/2018 23:54    EKG: Independently reviewed.  Sinus rhythm at 87 bpm  Assessment/Plan CVA, right sided weakness: subacute.  Patient presents with a 2-day history of right-sided weakness worse in the right arm than the right leg.  CT scan showing age-indeterminate left pons infarct. - Admit to telemetry bed - Stroke order set initiated - Neuro checks - Check  MRI/MRA head w/o contrast  - Vas carotid U/S - PT/OT/Speech to eval and treat - Check echocardiogram - Check Hemoglobin A1c and lipid panel in a.m. - ASA - Atorvastatin - Social work consult  - Appreciate Dr. Ernestene Kiel of neurology consultative services, will follow-up for further recommendation  Hypertensive urgency: Patient out  of the window for permissive hypertension.  Blood pressures noted to be as high as 193/89. - Hydralazine IV prn sBP>180 or dBP>110  Hyperglycemia: Acute. Initial glucose 217 on admission.  Patient reports no history of diabetes. - Hypoglycemic protocols - Follow-up hemoglobin A1c - CBGs q. before meals and at bedtime with sensitive SSI      DVT prophylaxis: lovenox  Code Status: Full Family Communication: Discussed plan of care with the patient family  present at bedside Disposition Plan: TBD  Consults called: Neurology  Admission status: inpatient  Clydie Braunondell A Juvia Aerts MD [facility] Pager [phone]   If 7PM-7AM, please contact night-coverage [email or web address] Password [email or web address]  07/10/2018, 12:24 AM

## 2018-07-10 NOTE — Progress Notes (Signed)
PT Cancellation Note  Patient Details Name: Samantha GuttingHpot Paul MRN: [id number] DOB: Oct 06, 1946   Cancelled Treatment:    Reason Eval/Treat Not Completed: Patient at procedure or test/unavailable   Jaynee Winters B Naveh Rickles 07/10/2018, 8:58 AM  Delaney MeigsMaija Tabor Tequan Redmon, PT [phone]

## 2018-07-10 NOTE — Progress Notes (Signed)
Inpatient Diabetes Program Recommendations  AACE/ADA: New Consensus Statement on Inpatient Glycemic Control (2015)  Target Ranges:  Prepandial:   less than 140 mg/dL      Peak postprandial:   less than 180 mg/dL (1-2 hours)      Critically ill patients:  140 - 180 mg/dL   Lab Results  Component Value Date   GLUCAP 213 (H) 07/10/2018    Review of Glycemic ControlResults for Samantha Paul, [name] (MRN [id number]) as of 07/10/2018 15:21  Ref. Range 07/09/2018 21:23 07/10/2018 04:14 07/10/2018 07:57 07/10/2018 11:57  Glucose-Capillary Latest Ref Range: 70 - 99 mg/dL 409194 (H) 811222 (H) 914234 (H) 213 (H)    Diabetes history: None-A1C pending Outpatient Diabetes medications: None Current orders for Inpatient glycemic control:  Novolog sensitive tid with meals and HS Inpatient Diabetes Program Recommendations:   Note Novolog correction added.  A1C pending.  ? New diagnosis.  Will follow.   Thanks,  Beryl MeagerJenny Rosmary Dionisio, RN, BC-ADM Inpatient Diabetes Coordinator Pager [phone] (8a-5p)

## 2018-07-10 NOTE — Progress Notes (Signed)
SLP Cancellation Note  Patient Details Name: Samantha Paul MRN: [id number] DOB: [birth year]   Cancelled treatment:       Reason Eval/Treat Not Completed: Patient at procedure or test/unavailable; pt being transported to radiology.  Will continue efforts.   Blenda MountsCouture, Jianna Drabik Laurice 07/10/2018, 12:01 PM

## 2018-07-10 NOTE — Progress Notes (Signed)
STROKE TEAM PROGRESS NOTE   SUBJECTIVE (INTERVAL HISTORY) Her son and husband are at the bedside.  Overall she feels her condition is gradually improving. Her son served as Engineer, technical sales. Pt still has right hemiparesis, but improved. MRI confirmed left pontine infarct, CTA head and neck unremarkable.    OBJECTIVE Temp:  [98.2 F (36.8 C)-98.7 F (37.1 C)] 98.2 F (36.8 C) (07/16 0730) Pulse Rate:  [61-93] 89 (07/16 1200) Cardiac Rhythm: Normal sinus rhythm (07/16 0700) Resp:  [9-31] 16 (07/16 1200) BP: (125-195)/(70-164) 165/88 (07/16 1200) SpO2:  [94 %-100 %] 98 % (07/16 1200) Weight:  [129 lb 13.6 oz (58.9 kg)] 129 lb 13.6 oz (58.9 kg) (07/16 0436)  Recent Labs  Lab 07/09/18 2123 07/10/18 0414 07/10/18 0757 07/10/18 1157  GLUCAP 194* 222* 234* 213*   Recent Labs  Lab 07/09/18 2112 07/09/18 2130 07/10/18 0332  NA 140 140 142  K 3.7 3.6 3.7  CL 103 102 106  CO2 25  --  26  GLUCOSE 217* 211* 211*  BUN 9 9 10   CREATININE 0.93 0.80 0.87  CALCIUM 9.2  --  8.8*   Recent Labs  Lab 07/09/18 2112  AST 14*  ALT 18  ALKPHOS 102  BILITOT 1.2  PROT 6.8  ALBUMIN 3.5   Recent Labs  Lab 07/09/18 2112 07/09/18 2130 07/10/18 0332  WBC 11.6*  --  9.8  NEUTROABS 7.6  --  6.3  HGB 13.5 15.6* 12.5  HCT 46.7* 46.0 42.6  MCV 75.6*  --  75.1*  PLT 219  --  174   No results for input(s): CKTOTAL, CKMB, CKMBINDEX, TROPONINI in the last 168 hours. Recent Labs    07/09/18 2112  LABPROT 12.8  INR 0.97   No results for input(s): COLORURINE, LABSPEC, PHURINE, GLUCOSEU, HGBUR, BILIRUBINUR, KETONESUR, PROTEINUR, UROBILINOGEN, NITRITE, LEUKOCYTESUR in the last 72 hours.  Invalid input(s): APPERANCEUR     Component Value Date/Time   CHOL 209 (H) 07/10/2018 0332   TRIG 148 07/10/2018 0332   HDL 39 (L) 07/10/2018 0332   CHOLHDL 5.4 07/10/2018 0332   VLDL 30 07/10/2018 0332   LDLCALC 140 (H) 07/10/2018 0332   No results found for: HGBA1C No results found for: LABOPIA,  COCAINSCRNUR, LABBENZ, AMPHETMU, THCU, LABBARB  Recent Labs  Lab 07/09/18 2115  ETH <10    I have personally reviewed the radiological images below and agree with the radiology interpretations.  Ct Angio Head W Or Wo Contrast  Result Date: 07/10/2018 CLINICAL DATA:  Right arm weakness and right leg weakness. Symptoms began about 3 days ago. Abnormal brainstem by CT. EXAM: CT ANGIOGRAPHY HEAD AND NECK TECHNIQUE: Multidetector CT imaging of the head and neck was performed using the standard protocol during bolus administration of intravenous contrast. Multiplanar CT image reconstructions and MIPs were obtained to evaluate the vascular anatomy. Carotid stenosis measurements (when applicable) are obtained utilizing NASCET criteria, using the distal internal carotid diameter as the denominator. CONTRAST:  50mL ISOVUE-370 IOPAMIDOL (ISOVUE-370) INJECTION 76% COMPARISON:  CT done yesterday. FINDINGS: CTA NECK FINDINGS Aortic arch: The aortic arch appears normal. Branching pattern of the brachiocephalic vessels is normal without origin stenosis. Right carotid system: Common carotid artery is tortuous but widely patent to the bifurcation. The carotid bifurcation does not show any plaque, stenosis or irregularity. Cervical ICA is tortuous but widely patent. Left carotid system: Left common carotid artery is widely patent to the bifurcation. No carotid bifurcation atherosclerotic disease. Cervical ICA is tortuous but widely patent. Vertebral arteries: The right  vertebral artery is dominant. Both vertebral artery origins are widely patent. Both vertebral arteries appear normal through the cervical region to the foramen magnum. Skeleton: Ordinary mid cervical spondylosis. Other neck: No soft tissue mass or lymphadenopathy. Upper chest: Negative Review of the MIP images confirms the above findings CTA HEAD FINDINGS Anterior circulation: There is atherosclerotic calcification in both carotid siphon regions but no  stenosis more than about 20 or 30%. The anterior and middle cerebral vessels are patent without proximal stenosis, aneurysm or vascular malformation. Distal branch vessels appear unremarkable. Posterior circulation: Both vertebral arteries are patent through the foramen magnum to the basilar. Dominant supply is from the right. Posterior inferior cerebellar arteries are patent. The basilar artery shows mild atherosclerotic irregularity but does not have a flow limiting stenosis. Superior cerebellar and posterior cerebral arteries appear normal. Right PCA takes fetal origin. Venous sinuses: Patent and normal. Anatomic variants: None significant. Delayed phase: No abnormal enhancement. Review of the MIP images confirms the above findings IMPRESSION: Carotid bifurcations appear normal. No anterior circulation disease of significance. Tortuous vessels suggesting a history of hypertension. Mild atherosclerotic irregularity of the basilar artery but without flow limiting stenosis. Electronically Signed   By: Paulina Fusi M.D.   On: 07/10/2018 14:12   Dg Chest 2 View  Result Date: 07/10/2018 CLINICAL DATA:  72 year old female with CVA. EXAM: CHEST - 2 VIEW COMPARISON:  None. FINDINGS: The lungs are clear. There is no pleural effusion or pneumothorax. Mild cardiomegaly. Degenerative changes of the shoulders. No acute osseous pathology. IMPRESSION: No active cardiopulmonary disease. Electronically Signed   By: Elgie Collard M.D.   On: 07/10/2018 02:11   Ct Head Wo Contrast  Result Date: 07/09/2018 CLINICAL DATA:  Patient presents with right arm weakness and right leg weakness with right facial droop. Grand daughter also states that her speech is not her usual. EXAM: CT HEAD WITHOUT CONTRAST TECHNIQUE: Contiguous axial images were obtained from the base of the skull through the vertex without intravenous contrast. COMPARISON:  None. FINDINGS: Brain: Age related involutional changes of the brain. Age-indeterminate  hypodensity involving the pons on the left. Age-indeterminate small vessel ischemic disease of periventricular and subcortical white matter. Idiopathic bilateral basal ganglial calcifications are seen. No intra-axial mass nor extra-axial fluid collections. No acute intracranial hemorrhage. Midline fourth ventricle and basal cisterns without effacement. Vascular: No hyperdense vessel sign. Moderate atherosclerosis of the carotid siphons. Skull: Intact Sinuses/Orbits: Nonacute Other: None IMPRESSION: Age-indeterminate infarct of the left pons believed to account for ill-defined hypodensity noted within. Atrophy with age-indeterminate small vessel ischemic disease is also noted though more likely to be chronic given atherosclerosis at the skull base. MRI may prove useful for further correlation. Electronically Signed   By: Tollie Eth M.D.   On: 07/09/2018 23:54   Ct Angio Neck W Or Wo Contrast  Result Date: 07/10/2018 CLINICAL DATA:  Right arm weakness and right leg weakness. Symptoms began about 3 days ago. Abnormal brainstem by CT. EXAM: CT ANGIOGRAPHY HEAD AND NECK TECHNIQUE: Multidetector CT imaging of the head and neck was performed using the standard protocol during bolus administration of intravenous contrast. Multiplanar CT image reconstructions and MIPs were obtained to evaluate the vascular anatomy. Carotid stenosis measurements (when applicable) are obtained utilizing NASCET criteria, using the distal internal carotid diameter as the denominator. CONTRAST:  50mL ISOVUE-370 IOPAMIDOL (ISOVUE-370) INJECTION 76% COMPARISON:  CT done yesterday. FINDINGS: CTA NECK FINDINGS Aortic arch: The aortic arch appears normal. Branching pattern of the brachiocephalic vessels is normal without  origin stenosis. Right carotid system: Common carotid artery is tortuous but widely patent to the bifurcation. The carotid bifurcation does not show any plaque, stenosis or irregularity. Cervical ICA is tortuous but widely  patent. Left carotid system: Left common carotid artery is widely patent to the bifurcation. No carotid bifurcation atherosclerotic disease. Cervical ICA is tortuous but widely patent. Vertebral arteries: The right vertebral artery is dominant. Both vertebral artery origins are widely patent. Both vertebral arteries appear normal through the cervical region to the foramen magnum. Skeleton: Ordinary mid cervical spondylosis. Other neck: No soft tissue mass or lymphadenopathy. Upper chest: Negative Review of the MIP images confirms the above findings CTA HEAD FINDINGS Anterior circulation: There is atherosclerotic calcification in both carotid siphon regions but no stenosis more than about 20 or 30%. The anterior and middle cerebral vessels are patent without proximal stenosis, aneurysm or vascular malformation. Distal branch vessels appear unremarkable. Posterior circulation: Both vertebral arteries are patent through the foramen magnum to the basilar. Dominant supply is from the right. Posterior inferior cerebellar arteries are patent. The basilar artery shows mild atherosclerotic irregularity but does not have a flow limiting stenosis. Superior cerebellar and posterior cerebral arteries appear normal. Right PCA takes fetal origin. Venous sinuses: Patent and normal. Anatomic variants: None significant. Delayed phase: No abnormal enhancement. Review of the MIP images confirms the above findings IMPRESSION: Carotid bifurcations appear normal. No anterior circulation disease of significance. Tortuous vessels suggesting a history of hypertension. Mild atherosclerotic irregularity of the basilar artery but without flow limiting stenosis. Electronically Signed   By: Paulina FusiMark  Shogry M.D.   On: 07/10/2018 14:12   Mr Brain Wo Contrast  Result Date: 07/10/2018 CLINICAL DATA:  Right arm and leg weakness, acute onset. Brainstem stroke. EXAM: MRI HEAD WITHOUT CONTRAST TECHNIQUE: Multiplanar, multiecho pulse sequences of the  brain and surrounding structures were obtained without intravenous contrast. COMPARISON:  CT angiography same day.  CT head yesterday. FINDINGS: Brain: Diffusion imaging shows a 1 x 2 cm acute infarction in the left para median pons. No other acute infarction. No old brainstem insult is identified. There is an old right cerebellar infarction. Cerebral hemispheres show an old lacunar infarction in the right caudate head and mild small vessel change of the hemispheric white matter. No large vessel territory infarction. No mass lesion, hemorrhage, hydrocephalus or extra-axial collection. Vascular: Major vessels at the base of the brain show flow. Skull and upper cervical spine: Negative Sinuses/Orbits: Clear/normal Other: None IMPRESSION: 1 x 2 cm acute infarction in the left para median pons. No evidence of hemorrhage or mass effect. Mild chronic small-vessel change elsewhere as above. Electronically Signed   By: Paulina FusiMark  Shogry M.D.   On: 07/10/2018 14:24    TTE pending   PHYSICAL EXAM  Temp:  [98.2 F (36.8 C)-98.7 F (37.1 C)] 98.2 F (36.8 C) (07/16 0730) Pulse Rate:  [61-93] 89 (07/16 1200) Resp:  [9-31] 16 (07/16 1200) BP: (125-195)/(70-164) 165/88 (07/16 1200) SpO2:  [94 %-100 %] 98 % (07/16 1200) Weight:  [129 lb 13.6 oz (58.9 kg)] 129 lb 13.6 oz (58.9 kg) (07/16 0436)  General - Well nourished, well developed, in no apparent distress.  Ophthalmologic - fundi not visualized due to noncooperation.  Cardiovascular - Regular rate and rhythm with no murmur.  Mental Status -  Level of arousal and orientation to time, place, and person were intact. Language including expression, naming, repetition, comprehension was assessed and found intact. Fund of Knowledge was assessed and was intact.  Cranial Nerves  II - XII - II - Visual field intact OU. III, IV, VI - Extraocular movements intact. V - Facial sensation intact bilaterally. VII - right facial droop. VIII - Hearing & vestibular  intact bilaterally X - Palate elevates symmetrically. XI - Chin turning & shoulder shrug intact bilaterally. XII - Tongue protrusion intact.  Motor Strength - The patient's strength was normal in LUE and LLE, but RUE 2+/5 deltoid and bicep and tricep and RLE 4-/5 proximal and 3/5 distally.  Bulk was normal and fasciculations were absent.   Motor Tone - Muscle tone was assessed at the neck and appendages and was normal.  Reflexes - The patient's reflexes were symmetrical in all extremities and she had no pathological reflexes.  Sensory - Light touch, temperature/pinprick were assessed and were symmetrical.    Coordination - The patient had normal movements in the left hand with no ataxia or dysmetria.  Tremor was absent.  Gait and Station - deferred.   ASSESSMENT/PLAN [name] is a 72 y.o. female with no significant hx admitted for right sided weakness and right facial droop. No tPA given due to OSW.    Stroke:  left pontine infarct likely secondary to small vessel disease source  Resultant right hemiparesis  MRI  Left pontine infarct  CTA head and neck unremarkable  2D Echo  pending  LDL 140  HgbA1c pending  lovenox for VTE prophylaxis  No antithrombotic prior to admission, now on aspirin 81 mg daily and clopidogrel 75 mg daily. Continue DAPT for 3 weeks and then either ASA or plavix alone  Patient counseled to be compliant with her antithrombotic medications  Ongoing aggressive stroke risk factor management  Therapy recommendations:  CIR  Disposition:  pending  Hypertension On the high side Permissive hypertension (OK if <220/120) for 24-48 hours post stroke and then gradually normalized within 5-7 days. Put on amlodipine  Long term BP goal normotensive  Hyperlipidemia  Home meds:  none   LDL 140, goal < 70  Now on lipitor 40  Continue statin at discharge  Other Stroke Risk Factors  Advanced age  Other Active Problems  Hyperglycemia - A1C  pending  Hospital day # 0   Marvel Plan, MD PhD Stroke Neurology 07/10/2018 3:18 PM    To contact Stroke Continuity provider, please refer to [email or web address]. After hours, contact General Neurology

## 2018-07-10 NOTE — ED Notes (Signed)
Patient transported to X-ray via stretcher 

## 2018-07-10 NOTE — Social Work (Signed)
CSW acknowledging consult for SNF placement, current recommendations are for CIR, will follow for any updates in disposition.   Samantha Paul, LCSWA [facility] Clinical Social Work [phone]

## 2018-07-10 NOTE — Evaluation (Signed)
Physical Therapy Evaluation Patient Details Name: Samantha Paul MRN: [id number] DOB: [birth year] Today's Date: 07/10/2018   History of Present Illness  72 yo vietnamese female admitted with right weakness with CT demonstrating age indeterminate left pons infarct. No PMHx  Clinical Impression  Pt pleasant in bed, family present. No interpreter present via phone or ipad for dialect and son as well as spouse present in room to provide home setup, PLOF and direct pt. Pt with grossly intact light touch with noted impaired RUE proprioception as well as strength. Pt with RHD, does not speak English and was unaware of concern for her deficits when they occurred on Saturday. Pt with decreased balance, strength, function, sensation and mobility who will benefit from acute therapy to maximize mobility, safety and function. Pt would greatly benefit from CIR given additional barrier of language to reach mod I- supervision prior to return home.     Follow Up Recommendations CIR;Supervision/Assistance - 24 hour    Equipment Recommendations  Other (comment);3in1 (PT)(quad cane)    Recommendations for Other Services OT consult     Precautions / Restrictions Precautions Precautions: Fall Precaution Comments: right hemiparesis Restrictions Weight Bearing Restrictions: No      Mobility  Bed Mobility Overal bed mobility: Needs Assistance Bed Mobility: Supine to Sit;Sit to Supine     Supine to sit: Min assist Sit to supine: Min assist   General bed mobility comments: assist to elevate trunk from supine and assist to bring RLE onto bed to return to supine  Transfers Overall transfer level: Needs assistance   Transfers: Sit to/from Stand Sit to Stand: Min assist         General transfer comment: min assist to stand from bed and chair with LUE assist and guarding for balance with belt  Ambulation/Gait Ambulation/Gait assistance: Mod assist Gait Distance (Feet): 25 Feet Assistive device: 1 person  hand held assist Gait Pattern/deviations: Step-to pattern;Decreased dorsiflexion - right   Gait velocity interpretation: <1.8 ft/sec, indicate of risk for recurrent falls General Gait Details: pt with hemiparetic gait sliding RLE but able to tolerate weight on RLE with stepping LLE. Pt with LUE assist for balance and weight shift with improved stepping with LUE support  Stairs            Wheelchair Mobility    Modified Rankin (Stroke Patients Only) Modified Rankin (Stroke Patients Only) Pre-Morbid Rankin Score: No symptoms Modified Rankin: Moderately severe disability     Balance Overall balance assessment: Needs assistance   Sitting balance-Leahy Scale: Good       Standing balance-Leahy Scale: Poor Standing balance comment: LUE support in standing                             Pertinent Vitals/Pain Pain Assessment: No/denies pain    Home Living Family/patient expects to be discharged to:: Private residence Living Arrangements: Spouse/significant other Available Help at Discharge: Family;Available 24 hours/day Type of Home: House Home Access: Stairs to enter   [employer] of Steps: 2 Home Layout: One level Home Equipment: None      Prior Function Level of Independence: Independent               Hand Dominance        Extremity/Trunk Assessment   Upper Extremity Assessment Upper Extremity Assessment: RUE deficits/detail RUE Deficits / Details: pt with limited strength grossly 2-/5 elbow flexion very limited shoulder flexion, grossly intact light touch with limited proprioception  Lower Extremity Assessment Lower Extremity Assessment: RLE deficits/detail RLE Deficits / Details: grossly 2/5 but difficult to fully assess due to language barrier    Cervical / Trunk Assessment Cervical / Trunk Assessment: Other exceptions Cervical / Trunk Exceptions: forward head, rounded shoulders  Communication   Communication: Prefers  language other than English(montagnard- Samantha Paul)  Cognition Arousal/Alertness: Awake/alert Behavior During Therapy: WFL for tasks assessed/performed Overall Cognitive Status: Impaired/Different from baseline Area of Impairment: Orientation;Safety/judgement                 Orientation Level: Disoriented to;Person       Safety/Judgement: Decreased awareness of deficits;Decreased awareness of safety     General Comments: pt not aware of birthdate but per family that is normal      General Comments      Exercises     Assessment/Plan    PT Assessment Patient needs continued PT services  PT Problem List Decreased strength;Decreased mobility;Decreased safety awareness;Decreased range of motion;Decreased coordination;Decreased activity tolerance;Decreased cognition;Decreased balance;Decreased knowledge of use of DME;Impaired sensation       PT Treatment Interventions Gait training;Therapeutic exercise;Patient/family education;DME instruction;Therapeutic activities;Cognitive remediation;Balance training;Functional mobility training;Neuromuscular re-education;Stair training    PT Goals (Current goals can be found in the Care Plan section)  Acute Rehab PT Goals Patient Stated Goal: return home PT Goal Formulation: With patient/family Time For Goal Achievement: 07/24/18 Potential to Achieve Goals: Good    Frequency Min 4X/week   Barriers to discharge        Co-evaluation               AM-PAC PT "6 Clicks" Daily Activity  Outcome Measure Difficulty turning over in bed (including adjusting bedclothes, sheets and blankets)?: A Lot Difficulty moving from lying on back to sitting on the side of the bed? : Unable Difficulty sitting down on and standing up from a chair with arms (e.g., wheelchair, bedside commode, etc,.)?: Unable Help needed moving to and from a bed to chair (including a wheelchair)?: A Lot Help needed walking in hospital room?: A Lot Help needed climbing  3-5 steps with a railing? : A Lot 6 Click Score: 10    End of Session Equipment Utilized During Treatment: Gait belt Activity Tolerance: Patient tolerated treatment well Patient left: in bed;with call bell/phone within reach;with family/visitor present;Other (comment)(transporter present for test) Nurse Communication: Mobility status;Precautions PT Visit Diagnosis: Other abnormalities of gait and mobility (R26.89);Other symptoms and signs involving the nervous system (R29.898);Hemiplegia and hemiparesis;Unsteadiness on feet (R26.81) Hemiplegia - Right/Left: Right Hemiplegia - dominant/non-dominant: Dominant Hemiplegia - caused by: Cerebral infarction    Time: 7829-56211008-1032 PT Time Calculation (min) (ACUTE ONLY): 24 min   Charges:   PT Evaluation $PT Eval Moderate Complexity: 1 Mod     PT G Codes:        Samantha Paul, PT [phone]   Samantha Paul 07/10/2018, 12:26 PM

## 2018-07-10 NOTE — ED Notes (Signed)
Report called to 4NP by Teup, RN

## 2018-07-10 NOTE — Progress Notes (Signed)
  Echocardiogram 2D Echocardiogram has been performed.  Makail Watling L Androw 07/10/2018, 11:17 AM

## 2018-07-10 NOTE — Evaluation (Signed)
Occupational Therapy Evaluation Patient Details Name: Samantha Paul MRN: [id number] DOB: [birth year] Today's Date: 07/10/2018    History of Present Illness 72 yo viet[place] female admitted with right weakness with CT demonstrating age indeterminate left pons infarct. No PMHx   Clinical Impression   Son translating throughout session; he reports pt independent with ADL and mobility PTA. Currently pt mod assist for functional mobility and mod-max assist for ADL. Pt presenting with R sided weakness, impaired gross/fine motor coordination, poor standing balance impacting her independence and safety with ADL and functional mobility. Recommending CIR level therapies to maximize independence and safety with ADL and functional mobility prior to return home. Pt would benefit from continued skilled OT to address established goals.    Follow Up Recommendations  CIR;Supervision/Assistance - 24 hour    Equipment Recommendations  Other (comment)(TBD at next venue)    Recommendations for Other Services       Precautions / Restrictions Precautions Precautions: Fall Precaution Comments: right hemiparesis Restrictions Weight Bearing Restrictions: No      Mobility Bed Mobility Overal bed mobility: Needs Assistance Bed Mobility: Supine to Sit;Sit to Supine     Supine to sit: Min assist Sit to supine: Min assist   General bed mobility comments: Min assist for trunk elevation to sitting, assist for LEs back to bed. HOB flat without use of bed rails  Transfers Overall transfer level: Needs assistance Equipment used: 1 person hand held assist Transfers: Sit to/from Stand Sit to Stand: Min assist         General transfer comment: Min assist to boost up from EOB and for standing balance. Provided HHA thoruhgout    Balance Overall balance assessment: Needs assistance Sitting-balance support: Feet unsupported Sitting balance-Leahy Scale: Good     Standing balance support: Single extremity  supported Standing balance-Leahy Scale: Poor                             ADL either performed or assessed with clinical judgement   ADL Overall ADL's : Needs assistance/impaired Eating/Feeding: Minimal assistance;Sitting Eating/Feeding Details (indicate cue type and reason): Pt reporting she has been self feeding with L hand Grooming: Moderate assistance;Sitting   Upper Body Bathing: Moderate assistance;Sitting   Lower Body Bathing: Maximal assistance;Sit to/from stand   Upper Body Dressing : Moderate assistance;Sitting   Lower Body Dressing: Maximal assistance;Sit to/from stand   Toilet Transfer: Moderate assistance;Ambulation Toilet Transfer Details (indicate cue type and reason): HHA, simulated by functional mobility in room         Functional mobility during ADLs: Moderate assistance(HHA)       Vision Baseline Vision/History: Wears glasses Wears Glasses: Reading only Patient Visual Report: No change from baseline Additional Comments: Needs further assessment     Perception     Praxis      Pertinent Vitals/Pain Pain Assessment: No/denies pain     Hand Dominance Right   Extremity/Trunk Assessment Upper Extremity Assessment Upper Extremity Assessment: RUE deficits/detail RUE Deficits / Details: pt with limited strength grossly 2-/5 elbow flexion very limited shoulder flexion, grossly intact light touch with limited proprioception RUE Sensation: decreased proprioception RUE Coordination: decreased fine motor;decreased gross motor   Lower Extremity Assessment Lower Extremity Assessment: Defer to PT evaluation   Cervical / Trunk Assessment Cervical / Trunk Assessment: Other exceptions Cervical / Trunk Exceptions: forward head, rounded shoulders   Communication Communication Communication: Prefers language other than English(montagnard- jarai, son interpreted)   Cognition Arousal/Alertness: Awake/alert Behavior  During Therapy: WFL for tasks  assessed/performed Overall Cognitive Status: Difficult to assess                                 General Comments: Pt appears to follow commands appropriately   General Comments       Exercises     Shoulder Instructions      Home Living Family/patient expects to be discharged to:: Private residence Living Arrangements: Spouse/significant other Available Help at Discharge: Family;Available 24 hours/day Type of Home: House Home Access: Stairs to enter [employer] CorporationEntrance Stairs-Number of Steps: 2   Home Layout: One level     Bathroom Shower/Tub: Chief Strategy OfficerTub/shower unit   Bathroom Toilet: Standard     Home Equipment: None          Prior Functioning/Environment Level of Independence: Independent                 OT Problem List: Decreased strength;Decreased range of motion;Decreased activity tolerance;Impaired balance (sitting and/or standing);Decreased coordination;Decreased knowledge of use of DME or AE;Impaired sensation;Impaired tone;Impaired UE functional use      OT Treatment/Interventions: Self-care/ADL training;Therapeutic exercise;Neuromuscular education;Energy conservation;DME and/or AE instruction;Therapeutic activities;Balance training;Patient/family education    OT Goals(Current goals can be found in the care plan section) Acute Rehab OT Goals Patient Stated Goal: return home OT Goal Formulation: With patient/family Time For Goal Achievement: 07/24/18 Potential to Achieve Goals: Good ADL Goals Pt Will Perform Eating: with set-up;with adaptive utensils;sitting Pt Will Perform Grooming: with min guard assist;standing Pt Will Transfer to Toilet: with min guard assist;ambulating Pt Will Perform Toileting - Clothing Manipulation and hygiene: with min guard assist;sit to/from stand  OT Frequency: Min 3X/week   Barriers to D/C:            Co-evaluation              AM-PAC PT "6 Clicks" Daily Activity     Outcome Measure Help from another person  eating meals?: A Little Help from another person taking care of personal grooming?: A Lot Help from another person toileting, which includes using toliet, bedpan, or urinal?: A Lot Help from another person bathing (including washing, rinsing, drying)?: A Lot Help from another person to put on and taking off regular upper body clothing?: A Lot Help from another person to put on and taking off regular lower body clothing?: A Lot 6 Click Score: 13   End of Session Equipment Utilized During Treatment: Gait belt  Activity Tolerance: Patient tolerated treatment well Patient left: in bed;with call bell/phone within reach;with family/visitor present  OT Visit Diagnosis: Unsteadiness on feet (R26.81);Other abnormalities of gait and mobility (R26.89);Hemiplegia and hemiparesis Hemiplegia - Right/Left: Right Hemiplegia - dominant/non-dominant: Dominant Hemiplegia - caused by: Cerebral infarction                Time: [id number] OT Time Calculation (min): 17 min Charges:  OT General Charges $OT Visit: 1 Visit OT Evaluation $OT Eval Moderate Complexity: 1 Mod G-Codes:     Lener Ventresca A. Brett Albinooffey, M.S., OTR/L Acute Rehab Department: [phone]  Gaye AlkenBailey A Adellyn Capek 07/10/2018, 4:10 PM

## 2018-07-11 DIAGNOSIS — I6302 Cerebral infarction due to thrombosis of basilar artery: Secondary | ICD-10-CM

## 2018-07-11 DIAGNOSIS — R531 Weakness: Secondary | ICD-10-CM

## 2018-07-11 DIAGNOSIS — I6350 Cerebral infarction due to unspecified occlusion or stenosis of unspecified cerebral artery: Secondary | ICD-10-CM

## 2018-07-11 DIAGNOSIS — E785 Hyperlipidemia, unspecified: Secondary | ICD-10-CM

## 2018-07-11 LAB — HEMOGLOBIN A1C
Hgb A1c MFr Bld: 12.3 % — ABNORMAL HIGH (ref 4.8–5.6)
Mean Plasma Glucose: 306 mg/dL

## 2018-07-11 LAB — GLUCOSE, CAPILLARY
GLUCOSE-CAPILLARY: 211 mg/dL — AB (ref 70–99)
GLUCOSE-CAPILLARY: 184 mg/dL — AB (ref 70–99)
GLUCOSE-CAPILLARY: 263 mg/dL — AB (ref 70–99)
GLUCOSE-CAPILLARY: 206 mg/dL — AB (ref 70–99)

## 2018-07-11 MED ORDER — INSULIN GLARGINE 100 UNIT/ML ~~LOC~~ SOLN
6.0000 [IU] | Freq: Every day | SUBCUTANEOUS | Status: DC
  Administered 2018-07-11 – 2018-07-12 (×2): 6 [IU] via SUBCUTANEOUS
  Filled 2018-07-11 (×2): qty 0.06

## 2018-07-11 MED ORDER — METFORMIN HCL 850 MG PO TABS
850.0000 mg | ORAL_TABLET | Freq: Two times a day (BID) | ORAL | Status: DC
  Administered 2018-07-11 – 2018-07-13 (×4): 850 mg via ORAL
  Filled 2018-07-11 (×5): qty 1

## 2018-07-11 MED ORDER — LOSARTAN POTASSIUM 50 MG PO TABS
50.0000 mg | ORAL_TABLET | Freq: Every day | ORAL | Status: DC
  Administered 2018-07-11 – 2018-07-13 (×3): 50 mg via ORAL
  Filled 2018-07-11 (×3): qty 1

## 2018-07-11 MED ORDER — SENNOSIDES-DOCUSATE SODIUM 8.6-50 MG PO TABS
2.0000 | ORAL_TABLET | Freq: Every day | ORAL | Status: DC
  Administered 2018-07-11 – 2018-07-12 (×2): 2 via ORAL
  Filled 2018-07-11 (×2): qty 2

## 2018-07-11 MED ORDER — LIVING WELL WITH DIABETES BOOK
Freq: Once | Status: DC
  Filled 2018-07-11: qty 1

## 2018-07-11 NOTE — Progress Notes (Signed)
Rehab Admissions Coordinator Note:  Per PT and OT recommendation, patient was screened by Nanine MeansKelly Rodrickus Min for appropriateness for an Inpatient Acute Rehab Consult.  At this time, we are recommending Inpatient Rehab consult. AC will contact MD for order. Please call for questions.   Nanine MeansKelly Ramesh Moan 07/11/2018, 3:28 PM  I can be reached at [phone].

## 2018-07-11 NOTE — Consult Note (Signed)
Physical Medicine and Rehabilitation Consult   Reason for Consult: right hemiparesis Referring Physician: [email or web address]   HPI: Samantha Paul is a 72 y.o. non-english speaking [place]  female who was admitted on 07/09/18 with 2 day history of right sided weakness, slurred speech and difficulty with ambulation. Patient with history of HTN and no medications X years per son. BP at admission 193/89 and MRI brain revealed 1 X 2 cm acute left paramedian pontine infarct.  CTA head/neck was negative for flow limiting stenosis. 2 D echo showed EF 65- 70% with moderate focal basal hypertrophy with moderate proximal septal thickening and findings s/o of HOCM. Dr. Roda Shutters felt that stroke was due to small vessel disease and recommended aggressive management of stroke risk factors with  DAPT X 3 weeks followed by ASA or Plavix alone. Patient with new diagnosis of DM with Hgb A1c- 12.3. Patient limited by right sided weakness with balance deficits. CIR recommended for follow up therapy.    Review of Systems  Constitutional: Negative for chills and fever.  Eyes: Negative for blurred vision and double vision.  Respiratory: Negative for cough and shortness of breath.   Cardiovascular: Negative for chest pain and palpitations.  Gastrointestinal: Positive for abdominal pain, diarrhea and heartburn.  Genitourinary: Negative for dysuria.  Musculoskeletal: Positive for back pain, joint pain and myalgias.  Skin: Negative for rash.  Neurological: Positive for focal weakness. Negative for dizziness and headaches.  Psychiatric/Behavioral: The patient is not nervous/anxious.       Past Medical History:  Diagnosis Date  . Back pain   . HTN (hypertension)   . Knee pain      History reviewed. No pertinent surgical history.    Family History  Problem Relation Age of Onset  . Arthritis Brother      Social History:  Married. Independent without AD. Lives with youngest son (who is unemployed at  this time). She used chew till mit 90's. Per  reports that she has never smoked. She has never used smokeless tobacco. She does not use alcohol or illicit drugs.     Allergies: No Known Allergies    No medications prior to admission.    Home: Home Living Family/patient expects to be discharged to:: Private residence Living Arrangements: Spouse/significant other Available Help at Discharge: Family, Available 24 hours/day Type of Home: House Home Access: Stairs to enter Secretary/administrator of Steps: 2 Home Layout: One level Bathroom Shower/Tub: Engineer, manufacturing systems: Standard Home Equipment: None  Functional History: Prior Function Level of Independence: Independent Functional Status:  Mobility: Bed Mobility Overal bed mobility: Needs Assistance Bed Mobility: Supine to Sit, Sit to Supine Supine to sit: Min assist Sit to supine: Min assist General bed mobility comments: Min assist for trunk elevation to sitting, assist for LEs back to bed. HOB flat without use of bed rails Transfers Overall transfer level: Needs assistance Equipment used: 1 person hand held assist Transfers: Sit to/from Stand Sit to Stand: Min assist General transfer comment: Min assist to boost up from EOB and for standing balance. Provided HHA thoruhgout Ambulation/Gait Ambulation/Gait assistance: Mod assist Gait Distance (Feet): 75 Feet Assistive device: Quad cane, 1 person hand held assist Gait Pattern/deviations: Decreased step length - right General Gait Details: Walked the hallway with Quad cane in pt's L hand and PT supporting R UE and shoulder girdle for better symmetry; Occasional very short R step length, leading to loss of balance anteriorly -- improved with cues; fatigued at the  end of walk Gait velocity interpretation: <1.8 ft/sec, indicate of risk for recurrent falls    ADL: ADL Overall ADL's : Needs assistance/impaired Eating/Feeding: Minimal assistance,  Sitting Eating/Feeding Details (indicate cue type and reason): Pt reporting she has been self feeding with L hand Grooming: Moderate assistance, Sitting Upper Body Bathing: Moderate assistance, Sitting Lower Body Bathing: Maximal assistance, Sit to/from stand Upper Body Dressing : Moderate assistance, Sitting Lower Body Dressing: Maximal assistance, Sit to/from stand Toilet Transfer: Moderate assistance, Ambulation Toilet Transfer Details (indicate cue type and reason): HHA, simulated by functional mobility in room Functional mobility during ADLs: Moderate assistance(HHA)  Cognition: Cognition Overall Cognitive Status: Difficult to assess Orientation Level: Oriented X4 Cognition Arousal/Alertness: Awake/alert Behavior During Therapy: WFL for tasks assessed/performed, Impulsive(slightly impulsive stand) Overall Cognitive Status: Difficult to assess Area of Impairment: Orientation, Safety/judgement Orientation Level: Disoriented to, Person Safety/Judgement: Decreased awareness of deficits, Decreased awareness of safety General Comments: Pt appears to follow commands appropriately Difficult to assess due to: Non-English speaking   Blood pressure (!) 151/76, pulse 76, temperature 98.4 F (36.9 C), temperature source Oral, resp. rate 14, height 4\' 10"  (1.473 m), weight 58.9 kg (129 lb 13.6 oz), SpO2 99 %. Physical Exam  Nursing note and vitals reviewed. Constitutional: She appears well-developed.  HENT:  Head: Normocephalic.  Eyes: Pupils are equal, round, and reactive to light.  Neck: Normal range of motion.  Cardiovascular: Normal rate.  Respiratory: Effort normal.  GI: Soft.  Neurological: She is alert.  Speech appears clear and she has reasonable awareness of deficits. Right hemiparesis, upper greater than lower. RUE 1-2/5 shoulder, biceps, triceps, 0-tr wrist and HI. RLE 2-3/5 and inconsistent. LUE and LLE 4-5/5. Ambulated with therapy with reasonable awareness of  balance/space. Followed directions well.   Psychiatric: She has a normal mood and affect. Her behavior is normal. Thought content normal.    Results for orders placed or performed during the hospital encounter of 07/09/18 (from the past 24 hour(s))  Glucose, capillary     Status: Abnormal   Collection Time: 07/11/18 12:05 PM  Result Value Ref Range   Glucose-Capillary 184 (H) 70 - 99 mg/dL  Glucose, capillary     Status: Abnormal   Collection Time: 07/11/18  4:45 PM  Result Value Ref Range   Glucose-Capillary 263 (H) 70 - 99 mg/dL  Glucose, capillary     Status: Abnormal   Collection Time: 07/11/18  9:42 PM  Result Value Ref Range   Glucose-Capillary 206 (H) 70 - 99 mg/dL  Glucose, capillary     Status: Abnormal   Collection Time: 07/12/18  8:08 AM  Result Value Ref Range   Glucose-Capillary 129 (H) 70 - 99 mg/dL   Ct Angio Head W Or Wo Contrast  Result Date: 07/10/2018 CLINICAL DATA:  Right arm weakness and right leg weakness. Symptoms began about 3 days ago. Abnormal brainstem by CT. EXAM: CT ANGIOGRAPHY HEAD AND NECK TECHNIQUE: Multidetector CT imaging of the head and neck was performed using the standard protocol during bolus administration of intravenous contrast. Multiplanar CT image reconstructions and MIPs were obtained to evaluate the vascular anatomy. Carotid stenosis measurements (when applicable) are obtained utilizing NASCET criteria, using the distal internal carotid diameter as the denominator. CONTRAST:  50mL ISOVUE-370 IOPAMIDOL (ISOVUE-370) INJECTION 76% COMPARISON:  CT done yesterday. FINDINGS: CTA NECK FINDINGS Aortic arch: The aortic arch appears normal. Branching pattern of the brachiocephalic vessels is normal without origin stenosis. Right carotid system: Common carotid artery is tortuous but widely patent to the  bifurcation. The carotid bifurcation does not show any plaque, stenosis or irregularity. Cervical ICA is tortuous but widely patent. Left carotid system: Left  common carotid artery is widely patent to the bifurcation. No carotid bifurcation atherosclerotic disease. Cervical ICA is tortuous but widely patent. Vertebral arteries: The right vertebral artery is dominant. Both vertebral artery origins are widely patent. Both vertebral arteries appear normal through the cervical region to the foramen magnum. Skeleton: Ordinary mid cervical spondylosis. Other neck: No soft tissue mass or lymphadenopathy. Upper chest: Negative Review of the MIP images confirms the above findings CTA HEAD FINDINGS Anterior circulation: There is atherosclerotic calcification in both carotid siphon regions but no stenosis more than about 20 or 30%. The anterior and middle cerebral vessels are patent without proximal stenosis, aneurysm or vascular malformation. Distal branch vessels appear unremarkable. Posterior circulation: Both vertebral arteries are patent through the foramen magnum to the basilar. Dominant supply is from the right. Posterior inferior cerebellar arteries are patent. The basilar artery shows mild atherosclerotic irregularity but does not have a flow limiting stenosis. Superior cerebellar and posterior cerebral arteries appear normal. Right PCA takes fetal origin. Venous sinuses: Patent and normal. Anatomic variants: None significant. Delayed phase: No abnormal enhancement. Review of the MIP images confirms the above findings IMPRESSION: Carotid bifurcations appear normal. No anterior circulation disease of significance. Tortuous vessels suggesting a history of hypertension. Mild atherosclerotic irregularity of the basilar artery but without flow limiting stenosis. Electronically Signed   By: Paulina Fusi M.D.   On: 07/10/2018 14:12   Ct Angio Neck W Or Wo Contrast  Result Date: 07/10/2018 CLINICAL DATA:  Right arm weakness and right leg weakness. Symptoms began about 3 days ago. Abnormal brainstem by CT. EXAM: CT ANGIOGRAPHY HEAD AND NECK TECHNIQUE: Multidetector CT imaging of  the head and neck was performed using the standard protocol during bolus administration of intravenous contrast. Multiplanar CT image reconstructions and MIPs were obtained to evaluate the vascular anatomy. Carotid stenosis measurements (when applicable) are obtained utilizing NASCET criteria, using the distal internal carotid diameter as the denominator. CONTRAST:  50mL ISOVUE-370 IOPAMIDOL (ISOVUE-370) INJECTION 76% COMPARISON:  CT done yesterday. FINDINGS: CTA NECK FINDINGS Aortic arch: The aortic arch appears normal. Branching pattern of the brachiocephalic vessels is normal without origin stenosis. Right carotid system: Common carotid artery is tortuous but widely patent to the bifurcation. The carotid bifurcation does not show any plaque, stenosis or irregularity. Cervical ICA is tortuous but widely patent. Left carotid system: Left common carotid artery is widely patent to the bifurcation. No carotid bifurcation atherosclerotic disease. Cervical ICA is tortuous but widely patent. Vertebral arteries: The right vertebral artery is dominant. Both vertebral artery origins are widely patent. Both vertebral arteries appear normal through the cervical region to the foramen magnum. Skeleton: Ordinary mid cervical spondylosis. Other neck: No soft tissue mass or lymphadenopathy. Upper chest: Negative Review of the MIP images confirms the above findings CTA HEAD FINDINGS Anterior circulation: There is atherosclerotic calcification in both carotid siphon regions but no stenosis more than about 20 or 30%. The anterior and middle cerebral vessels are patent without proximal stenosis, aneurysm or vascular malformation. Distal branch vessels appear unremarkable. Posterior circulation: Both vertebral arteries are patent through the foramen magnum to the basilar. Dominant supply is from the right. Posterior inferior cerebellar arteries are patent. The basilar artery shows mild atherosclerotic irregularity but does not have a  flow limiting stenosis. Superior cerebellar and posterior cerebral arteries appear normal. Right PCA takes fetal origin. Venous sinuses:  Patent and normal. Anatomic variants: None significant. Delayed phase: No abnormal enhancement. Review of the MIP images confirms the above findings IMPRESSION: Carotid bifurcations appear normal. No anterior circulation disease of significance. Tortuous vessels suggesting a history of hypertension. Mild atherosclerotic irregularity of the basilar artery but without flow limiting stenosis. Electronically Signed   By: Paulina Fusi M.D.   On: 07/10/2018 14:12   Mr Brain Wo Contrast  Result Date: 07/10/2018 CLINICAL DATA:  Right arm and leg weakness, acute onset. Brainstem stroke. EXAM: MRI HEAD WITHOUT CONTRAST TECHNIQUE: Multiplanar, multiecho pulse sequences of the brain and surrounding structures were obtained without intravenous contrast. COMPARISON:  CT angiography same day.  CT head yesterday. FINDINGS: Brain: Diffusion imaging shows a 1 x 2 cm acute infarction in the left para median pons. No other acute infarction. No old brainstem insult is identified. There is an old right cerebellar infarction. Cerebral hemispheres show an old lacunar infarction in the right caudate head and mild small vessel change of the hemispheric white matter. No large vessel territory infarction. No mass lesion, hemorrhage, hydrocephalus or extra-axial collection. Vascular: Major vessels at the base of the brain show flow. Skull and upper cervical spine: Negative Sinuses/Orbits: Clear/normal Other: None IMPRESSION: 1 x 2 cm acute infarction in the left para median pons. No evidence of hemorrhage or mass effect. Mild chronic small-vessel change elsewhere as above. Electronically Signed   By: Paulina Fusi M.D.   On: 07/10/2018 14:24     Assessment/Plan: Diagnosis: left pontine infarct 1. Does the need for close, 24 hr/day medical supervision in concert with the patient's rehab needs make it  unreasonable for this patient to be served in a less intensive setting? Yes 2. Co-Morbidities requiring supervision/potential complications: HTN, post-stroke sequelae 3. Due to bladder management, bowel management, safety, skin/wound care, disease management, medication administration, pain management and patient education, does the patient require 24 hr/day rehab nursing? Yes 4. Does the patient require coordinated care of a physician, rehab nurse, PT (1-2 hrs/day, 5 days/week) and OT (1-2 hrs/day, 5 days/week) , probably not SLP, to address physical and functional deficits in the context of the above medical diagnosis(es)? Yes Addressing deficits in the following areas: balance, endurance, locomotion, strength, transferring, bowel/bladder control, bathing, dressing, feeding, grooming, toileting and psychosocial support 5. Can the patient actively participate in an intensive therapy program of at least 3 hrs of therapy per day at least 5 days per week? Yes 6. The potential for patient to make measurable gains while on inpatient rehab is excellent 7. Anticipated functional outcomes upon discharge from inpatient rehab are modified independent  with PT, modified independent and supervision with OT, n/a with SLP. 8. Estimated rehab length of stay to reach the above functional goals is: 7-12 days 9. Anticipated D/C setting: Home 10. Anticipated post D/C treatments: HH therapy and Outpatient therapy 11. Overall Rehab/Functional Prognosis: excellent  RECOMMENDATIONS: This patient's condition is appropriate for continued rehabilitative care in the following setting: CIR Patient has agreed to participate in recommended program. Yes and Potentially Note that insurance prior authorization may be required for reimbursement for recommended care.  Comment: Rehab Admissions Coordinator to follow up.  Thanks,  Ranelle Oyster, MD, [place] [facility]  I have personally performed a face to face diagnostic evaluation of  this patient. Additionally, I have reviewed and concur with the physician assistant's documentation above.    Jacquelynn Cree, PA-C 07/12/2018

## 2018-07-11 NOTE — Progress Notes (Signed)
STROKE TEAM PROGRESS NOTE   SUBJECTIVE (INTERVAL HISTORY) Her son and interpretor are at the bedside. No acute event overnight. Her right sided weakness continues to improve. A1C 12.3. BP still on the high side.     OBJECTIVE Temp:  [98.1 F (36.7 C)-99 F (37.2 C)] 98.4 F (36.9 C) (07/17 0752) Pulse Rate:  [69-89] 85 (07/17 0752) Cardiac Rhythm: Normal sinus rhythm (07/17 0800) Resp:  [14-28] 18 (07/17 0752) BP: (115-173)/(67-134) 173/83 (07/17 0752) SpO2:  [85 %-100 %] 98 % (07/17 0752)  Recent Labs  Lab 07/10/18 0757 07/10/18 1157 07/10/18 1653 07/10/18 2118 07/11/18 0758  GLUCAP 234* 213* 181* 273* 211*   Recent Labs  Lab 07/09/18 2112 07/09/18 2130 07/10/18 0332  NA 140 140 142  K 3.7 3.6 3.7  CL 103 102 106  CO2 25  --  26  GLUCOSE 217* 211* 211*  BUN 9 9 10   CREATININE 0.93 0.80 0.87  CALCIUM 9.2  --  8.8*   Recent Labs  Lab 07/09/18 2112  AST 14*  ALT 18  ALKPHOS 102  BILITOT 1.2  PROT 6.8  ALBUMIN 3.5   Recent Labs  Lab 07/09/18 2112 07/09/18 2130 07/10/18 0332  WBC 11.6*  --  9.8  NEUTROABS 7.6  --  6.3  HGB 13.5 15.6* 12.5  HCT 46.7* 46.0 42.6  MCV 75.6*  --  75.1*  PLT 219  --  174   No results for input(s): CKTOTAL, CKMB, CKMBINDEX, TROPONINI in the last 168 hours. Recent Labs    07/09/18 2112  LABPROT 12.8  INR 0.97   No results for input(s): COLORURINE, LABSPEC, PHURINE, GLUCOSEU, HGBUR, BILIRUBINUR, KETONESUR, PROTEINUR, UROBILINOGEN, NITRITE, LEUKOCYTESUR in the last 72 hours.  Invalid input(s): APPERANCEUR     Component Value Date/Time   CHOL 209 (H) 07/10/2018 0332   TRIG 148 07/10/2018 0332   HDL 39 (L) 07/10/2018 0332   CHOLHDL 5.4 07/10/2018 0332   VLDL 30 07/10/2018 0332   LDLCALC 140 (H) 07/10/2018 0332   Lab Results  Component Value Date   HGBA1C 12.3 (H) 07/10/2018   No results found for: LABOPIA, COCAINSCRNUR, LABBENZ, AMPHETMU, THCU, LABBARB  Recent Labs  Lab 07/09/18 2115  ETH <10    I have  personally reviewed the radiological images below and agree with the radiology interpretations.  Ct Angio Head W Or Wo Contrast  Result Date: 07/10/2018 CLINICAL DATA:  Right arm weakness and right leg weakness. Symptoms began about 3 days ago. Abnormal brainstem by CT. EXAM: CT ANGIOGRAPHY HEAD AND NECK TECHNIQUE: Multidetector CT imaging of the head and neck was performed using the standard protocol during bolus administration of intravenous contrast. Multiplanar CT image reconstructions and MIPs were obtained to evaluate the vascular anatomy. Carotid stenosis measurements (when applicable) are obtained utilizing NASCET criteria, using the distal internal carotid diameter as the denominator. CONTRAST:  50mL ISOVUE-370 IOPAMIDOL (ISOVUE-370) INJECTION 76% COMPARISON:  CT done yesterday. FINDINGS: CTA NECK FINDINGS Aortic arch: The aortic arch appears normal. Branching pattern of the brachiocephalic vessels is normal without origin stenosis. Right carotid system: Common carotid artery is tortuous but widely patent to the bifurcation. The carotid bifurcation does not show any plaque, stenosis or irregularity. Cervical ICA is tortuous but widely patent. Left carotid system: Left common carotid artery is widely patent to the bifurcation. No carotid bifurcation atherosclerotic disease. Cervical ICA is tortuous but widely patent. Vertebral arteries: The right vertebral artery is dominant. Both vertebral artery origins are widely patent. Both vertebral arteries appear  normal through the cervical region to the foramen magnum. Skeleton: Ordinary mid cervical spondylosis. Other neck: No soft tissue mass or lymphadenopathy. Upper chest: Negative Review of the MIP images confirms the above findings CTA HEAD FINDINGS Anterior circulation: There is atherosclerotic calcification in both carotid siphon regions but no stenosis more than about 20 or 30%. The anterior and middle cerebral vessels are patent without proximal  stenosis, aneurysm or vascular malformation. Distal branch vessels appear unremarkable. Posterior circulation: Both vertebral arteries are patent through the foramen magnum to the basilar. Dominant supply is from the right. Posterior inferior cerebellar arteries are patent. The basilar artery shows mild atherosclerotic irregularity but does not have a flow limiting stenosis. Superior cerebellar and posterior cerebral arteries appear normal. Right PCA takes fetal origin. Venous sinuses: Patent and normal. Anatomic variants: None significant. Delayed phase: No abnormal enhancement. Review of the MIP images confirms the above findings IMPRESSION: Carotid bifurcations appear normal. No anterior circulation disease of significance. Tortuous vessels suggesting a history of hypertension. Mild atherosclerotic irregularity of the basilar artery but without flow limiting stenosis. Electronically Signed   By: Paulina Fusi M.D.   On: 07/10/2018 14:12   Dg Chest 2 View  Result Date: 07/10/2018 CLINICAL DATA:  72 year old female with CVA. EXAM: CHEST - 2 VIEW COMPARISON:  None. FINDINGS: The lungs are clear. There is no pleural effusion or pneumothorax. Mild cardiomegaly. Degenerative changes of the shoulders. No acute osseous pathology. IMPRESSION: No active cardiopulmonary disease. Electronically Signed   By: Elgie Collard M.D.   On: 07/10/2018 02:11   Ct Head Wo Contrast  Result Date: 07/09/2018 CLINICAL DATA:  Patient presents with right arm weakness and right leg weakness with right facial droop. Grand daughter also states that her speech is not her usual. EXAM: CT HEAD WITHOUT CONTRAST TECHNIQUE: Contiguous axial images were obtained from the base of the skull through the vertex without intravenous contrast. COMPARISON:  None. FINDINGS: Brain: Age related involutional changes of the brain. Age-indeterminate hypodensity involving the pons on the left. Age-indeterminate small vessel ischemic disease of  periventricular and subcortical white matter. Idiopathic bilateral basal ganglial calcifications are seen. No intra-axial mass nor extra-axial fluid collections. No acute intracranial hemorrhage. Midline fourth ventricle and basal cisterns without effacement. Vascular: No hyperdense vessel sign. Moderate atherosclerosis of the carotid siphons. Skull: Intact Sinuses/Orbits: Nonacute Other: None IMPRESSION: Age-indeterminate infarct of the left pons believed to account for ill-defined hypodensity noted within. Atrophy with age-indeterminate small vessel ischemic disease is also noted though more likely to be chronic given atherosclerosis at the skull base. MRI may prove useful for further correlation. Electronically Signed   By: Tollie Eth M.D.   On: 07/09/2018 23:54   Ct Angio Neck W Or Wo Contrast  Result Date: 07/10/2018 CLINICAL DATA:  Right arm weakness and right leg weakness. Symptoms began about 3 days ago. Abnormal brainstem by CT. EXAM: CT ANGIOGRAPHY HEAD AND NECK TECHNIQUE: Multidetector CT imaging of the head and neck was performed using the standard protocol during bolus administration of intravenous contrast. Multiplanar CT image reconstructions and MIPs were obtained to evaluate the vascular anatomy. Carotid stenosis measurements (when applicable) are obtained utilizing NASCET criteria, using the distal internal carotid diameter as the denominator. CONTRAST:  50mL ISOVUE-370 IOPAMIDOL (ISOVUE-370) INJECTION 76% COMPARISON:  CT done yesterday. FINDINGS: CTA NECK FINDINGS Aortic arch: The aortic arch appears normal. Branching pattern of the brachiocephalic vessels is normal without origin stenosis. Right carotid system: Common carotid artery is tortuous but widely patent to the  bifurcation. The carotid bifurcation does not show any plaque, stenosis or irregularity. Cervical ICA is tortuous but widely patent. Left carotid system: Left common carotid artery is widely patent to the bifurcation. No  carotid bifurcation atherosclerotic disease. Cervical ICA is tortuous but widely patent. Vertebral arteries: The right vertebral artery is dominant. Both vertebral artery origins are widely patent. Both vertebral arteries appear normal through the cervical region to the foramen magnum. Skeleton: Ordinary mid cervical spondylosis. Other neck: No soft tissue mass or lymphadenopathy. Upper chest: Negative Review of the MIP images confirms the above findings CTA HEAD FINDINGS Anterior circulation: There is atherosclerotic calcification in both carotid siphon regions but no stenosis more than about 20 or 30%. The anterior and middle cerebral vessels are patent without proximal stenosis, aneurysm or vascular malformation. Distal branch vessels appear unremarkable. Posterior circulation: Both vertebral arteries are patent through the foramen magnum to the basilar. Dominant supply is from the right. Posterior inferior cerebellar arteries are patent. The basilar artery shows mild atherosclerotic irregularity but does not have a flow limiting stenosis. Superior cerebellar and posterior cerebral arteries appear normal. Right PCA takes fetal origin. Venous sinuses: Patent and normal. Anatomic variants: None significant. Delayed phase: No abnormal enhancement. Review of the MIP images confirms the above findings IMPRESSION: Carotid bifurcations appear normal. No anterior circulation disease of significance. Tortuous vessels suggesting a history of hypertension. Mild atherosclerotic irregularity of the basilar artery but without flow limiting stenosis. Electronically Signed   By: Paulina Fusi M.D.   On: 07/10/2018 14:12   Mr Brain Wo Contrast  Result Date: 07/10/2018 CLINICAL DATA:  Right arm and leg weakness, acute onset. Brainstem stroke. EXAM: MRI HEAD WITHOUT CONTRAST TECHNIQUE: Multiplanar, multiecho pulse sequences of the brain and surrounding structures were obtained without intravenous contrast. COMPARISON:  CT  angiography same day.  CT head yesterday. FINDINGS: Brain: Diffusion imaging shows a 1 x 2 cm acute infarction in the left para median pons. No other acute infarction. No old brainstem insult is identified. There is an old right cerebellar infarction. Cerebral hemispheres show an old lacunar infarction in the right caudate head and mild small vessel change of the hemispheric white matter. No large vessel territory infarction. No mass lesion, hemorrhage, hydrocephalus or extra-axial collection. Vascular: Major vessels at the base of the brain show flow. Skull and upper cervical spine: Negative Sinuses/Orbits: Clear/normal Other: None IMPRESSION: 1 x 2 cm acute infarction in the left para median pons. No evidence of hemorrhage or mass effect. Mild chronic small-vessel change elsewhere as above. Electronically Signed   By: Paulina Fusi M.D.   On: 07/10/2018 14:24    TTE - Left ventricle: The cavity size was normal. Wall thickness was   increased in a pattern of mild LVH. There was moderate focal   basal hypertrophy of the septum. Systolic function was vigorous.   The estimated ejection fraction was in the range of 65% to 70%.   Wall motion was normal; there were no regional wall motion   abnormalities. Doppler parameters are consistent with abnormal   left ventricular relaxation (grade 1 diastolic dysfunction).   Doppler parameters are consistent with high ventricular filling   pressure. - Mitral valve: Calcified annulus. There was systolic anterior   motion of the chordal structures.   PHYSICAL EXAM  Temp:  [98.1 F (36.7 C)-99 F (37.2 C)] 98.4 F (36.9 C) (07/17 0752) Pulse Rate:  [69-89] 85 (07/17 0752) Resp:  [14-28] 18 (07/17 0752) BP: (115-173)/(67-134) 173/83 (07/17 0752) SpO2:  [  85 %-100 %] 98 % (07/17 0752)  General - Well nourished, well developed, in no apparent distress.  Ophthalmologic - fundi not visualized due to noncooperation.  Cardiovascular - Regular rate and rhythm  with no murmur.  Mental Status -  Level of arousal and orientation to time, place, and person were intact. Language including expression, naming, repetition, comprehension was assessed and found intact. Fund of Knowledge was assessed and was intact.  Cranial Nerves II - XII - II - Visual field intact OU. III, IV, VI - Extraocular movements intact. V - Facial sensation intact bilaterally. VII - right facial droop. VIII - Hearing & vestibular intact bilaterally X - Palate elevates symmetrically. XI - Chin turning & shoulder shrug intact bilaterally. XII - Tongue protrusion intact.  Motor Strength - The patient's strength was normal in LUE and LLE, but RUE 3/5 deltoid and bicep and tricep and RLE 4/5 proximal and 3/5 distally.  Bulk was normal and fasciculations were absent.   Motor Tone - Muscle tone was assessed at the neck and appendages and was normal.  Reflexes - The patient's reflexes were symmetrical in all extremities and she had no pathological reflexes.  Sensory - Light touch, temperature/pinprick were assessed and were symmetrical.    Coordination - The patient had normal movements in the left hand with no ataxia or dysmetria.  Tremor was absent.  Gait and Station - deferred.   ASSESSMENT/PLAN [name] is a 72 y.o. female with no significant hx admitted for right sided weakness and right facial droop. No tPA given due to OSW.    Stroke:  left pontine infarct likely secondary to small vessel disease source  Resultant right hemiparesis  MRI  Left pontine infarct  CTA head and neck unremarkable  2D Echo EF 65-70%  LDL 140  HgbA1c 12.3  lovenox for VTE prophylaxis  No antithrombotic prior to admission, now on aspirin 81 mg daily and clopidogrel 75 mg daily. Continue DAPT for 3 weeks and then either ASA or plavix alone  Patient counseled to be compliant with her antithrombotic medications  Ongoing aggressive stroke risk factor management  Therapy  recommendations:  CIR  Disposition:  Pending  DM, new diagnosis  Hb A1C 12.3, goal < 7.0  DM educator consult  Close PCP follow up   SSI  CBG monitoring  Likely need insulin therapy  Hypertension On the high side Put on amlodipine and losartan  Long term BP goal normotensive  Hyperlipidemia  Home meds:  none   LDL 140, goal < 70  Now on lipitor 40  Continue statin at discharge  Other Stroke Risk Factors  Advanced age  Other Active Problems  Hyperglycemia    Hospital day # 1  Neurology will sign off. Please call with questions. Pt will follow up with stroke clinic NP at [facility] in about 4 weeks. Thanks for the consult.   Marvel Plan, MD PhD Stroke Neurology 07/11/2018 11:11 AM    To contact Stroke Continuity provider, please refer to [email or web address]. After hours, contact General Neurology

## 2018-07-11 NOTE — Progress Notes (Addendum)
RN participated in a lengthy bedside meeting regarding patient's health. Those involved during this meeting were, Dr. Mariea ClontsEmokpae, Mrs. Svea, her son, and Clinical research associatewriter. Dr. Marvel PlanJindong Xu later joined once Dr. Mariea ClontsEmokpae wrapped up.  Both bedside meetings were translated with an in person translator by the name of: [name], with [employer] [phone]. Dr. Mariea ClontsEmokpae initiated the meeting by discussing patient's current and past state of health. Patient was asked about her family's medical history. Patient stated she had no knowledge of the family history due to lack of healthcare resources in her county.   Dr. Mariea ClontsEmokpae explained to Mrs. Coren the cause of her current diagnosis. He inquired about her current cultural health practices & belieft's, health disparities, and health maintanence.   Dr. Mariea ClontsEmokpae explained the causes of diabetes, hypertension, and hyperlipidemia. He explained to the patient her risk of experiencing a second stroke if the recommended modifiable changes weren't addressed immediately.  Dr. Mariea ClontsEmokpae was asked to elaborate more regarding the necessary health changes, treatment regiment and compliance concerns expected .  Patient's son was able to repeat back most of the information provided. However, on several occassions it was obvious patient and her son were becoming overwhelmed with the influx of information. Provider was asked a few times to pause in his demonstration allowing the Interpreter an opportunity to assess the patient and son's level of understanding.   Although Mrs. Shulamis continued to deny having questions, her son seemed a little more comfortable and began asking more questions. During this time, Clinical research associatewriter noted on two occassions, Mrs. Abril's son attempting to contact his dad twice to be included in the teaching. When Dr. Mariea ClontsEmokpae had completed is demonstration, all of patient's son's questions were answered.    Patient's son reports patient and her spouse  are currently residing with one of his brother's and had initially planned to return home to their country later this month to visit his sister.   Patient's discharge plans were discussed. Both the patient and her son were agreeable to post acute rehab post hospital discharge.  Dr. Roda ShuttersXu with Neurology later arrived to the meeting and provided more helpful information regarding patient's current dx. and the importance of rehab. Once again, patient and son were provided the opportunity to ask additional questions in which they both declined to do so.  Meeting was adjourned. Patient and son provided updates regarding Thursday's plan to have the Diabetic Education return with the interpreter to discuss diet, diabetes, and exercise. RN asked patient's son to please make sue his dad was present at that meeting if possible.

## 2018-07-11 NOTE — Progress Notes (Signed)
Patient Demographics:    Samantha Paul, is a 72 y.o. female, DOB - 1946-01-06, ZOX:[id number]  Admit date - 07/09/2018   Admitting Physician Clydie Braunondell A Smith, MD  Outpatient Primary MD for the patient is Patient, No Pcp Per  LOS - 1   Chief Complaint  Patient presents with  . Stroke Symptoms        Subjective:    Yen Boomhower today has no fevers, no emesis,  No chest pain, official interpreter and patient's son at bedside  Assessment  & Plan :    Active Problems:   CVA (cerebral vascular accident) (HCC)   Hypertensive urgency   Hyperglycemia   Right sided weakness   Hyperlipidemia   1) acute stroke--- right upper extremity weakness persist, neurology input appreciated, continue Plavix and aspirin for 3 weeks and then Plavix alone after that, continue Lipitor 40 mg daily, awaiting transfer to inpatient rehab  2)HTN-Per neurologist okay to start more aggressively controlling BP, start amlodipine 10 mg daily and losartan 50 mg daily,,  may use IV Hydralazine 10 mg  Every 4 hours Prn for systolic blood pressure over 180 mmhg   3)DM--diagnosis, A1c above 12, give metformin 850 mg twice daily, diabetic education diabetic diet, start Lantus insulin 6 units nightly, Use Novolog/Humalog Sliding scale insulin with Accu-Cheks/Fingersticks as ordered    Code Status : full    Disposition Plan  : CIR   Consults  : Physical therapy, inpatient rehab consult, neurology consult   DVT Prophylaxis  :  Lovenox   Lab Results  Component Value Date   PLT 174 07/10/2018    Inpatient Medications  Scheduled Meds: . amLODipine  10 mg Oral Daily  . aspirin EC  81 mg Oral Daily  . atorvastatin  40 mg Oral q1800  . clopidogrel  75 mg Oral Daily  . enoxaparin (LOVENOX) injection  40 mg Subcutaneous Q24H  . insulin aspart  0-5 Units Subcutaneous QHS  . insulin aspart  0-9 Units Subcutaneous TID WC  . insulin glargine   6 Units Subcutaneous QHS  . living well with diabetes book   Does not apply Once  . losartan  50 mg Oral Daily  . metFORMIN  850 mg Oral BID WC  . senna-docusate  2 tablet Oral QHS   Continuous Infusions: . sodium chloride 75 mL/hr at 07/11/18 0549   PRN Meds:.acetaminophen **OR** acetaminophen (TYLENOL) oral liquid 160 mg/5 mL **OR** acetaminophen, hydrALAZINE    Anti-infectives (From admission, onward)   None        Objective:   Vitals:   07/11/18 0800 07/11/18 1200 07/11/18 1202 07/11/18 1646  BP: (!) 177/90 140/77 140/77   Pulse: 87 94 80   Resp: 19 20 16    Temp:   98.4 F (36.9 C) 98.3 F (36.8 C)  TempSrc:   Oral Oral  SpO2: 99% 99% 98%   Weight:      Height:        Wt Readings from Last 3 Encounters:  07/10/18 58.9 kg (129 lb 13.6 oz)     Intake/Output Summary (Last 24 hours) at 07/11/2018 1721 Last data filed at 07/11/2018 1400 Gross per 24 hour  Intake 1664.29 ml  Output 300 ml  Net 1364.29 ml  Physical Exam  Gen:- Awake Alert,  In no apparent distress  HEENT:- [place].AT, No sclera icterus, facial asymmetry noted Neck-Supple Neck,No JVD,.  Lungs-  CTAB , good air movement CV- S1, S2 normal Abd-  +ve B.Sounds, Abd Soft, No tenderness,    Extremity/Skin:- No  edema,   good pulses Psych-affect is appropriate, oriented x3 Neuro-right upper extremity strength is 3/5, right lower extremity strength is 4/5, no significant left-sided deficits   Data Review:   Micro Results Recent Results (from the past 240 hour(s))  MRSA PCR Screening     Status: None   Collection Time: 07/10/18  5:46 AM  Result Value Ref Range Status   MRSA by PCR NEGATIVE NEGATIVE Final    Comment:        The GeneXpert MRSA Assay (FDA approved for NASAL specimens only), is one component of a comprehensive MRSA colonization surveillance program. It is not intended to diagnose MRSA infection nor to guide or monitor treatment for MRSA infections. Performed at [facility], [address], [place], [place] [address]     Radiology Reports Ct Angio Head W Or Wo Contrast  Result Date: 07/10/2018 CLINICAL DATA:  Right arm weakness and right leg weakness. Symptoms began about 3 days ago. Abnormal brainstem by CT. EXAM: CT ANGIOGRAPHY HEAD AND NECK TECHNIQUE: Multidetector CT imaging of the head and neck was performed using the standard protocol during bolus administration of intravenous contrast. Multiplanar CT image reconstructions and MIPs were obtained to evaluate the vascular anatomy. Carotid stenosis measurements (when applicable) are obtained utilizing NASCET criteria, using the distal internal carotid diameter as the denominator. CONTRAST:  50mL ISOVUE-370 IOPAMIDOL (ISOVUE-370) INJECTION 76% COMPARISON:  CT done yesterday. FINDINGS: CTA NECK FINDINGS Aortic arch: The aortic arch appears normal. Branching pattern of the brachiocephalic vessels is normal without origin stenosis. Right carotid system: Common carotid artery is tortuous but widely patent to the bifurcation. The carotid bifurcation does not show any plaque, stenosis or irregularity. Cervical ICA is tortuous but widely patent. Left carotid system: Left common carotid artery is widely patent to the bifurcation. No carotid bifurcation atherosclerotic disease. Cervical ICA is tortuous but widely patent. Vertebral arteries: The right vertebral artery is dominant. Both vertebral artery origins are widely patent. Both vertebral arteries appear normal through the cervical region to the foramen magnum. Skeleton: Ordinary mid cervical spondylosis. Other neck: No soft tissue mass or lymphadenopathy. Upper chest: Negative Review of the MIP images confirms the above findings CTA HEAD FINDINGS Anterior circulation: There is atherosclerotic calcification in both carotid siphon regions but no stenosis more than about 20 or 30%. The anterior and middle cerebral vessels are patent without proximal stenosis, aneurysm or  vascular malformation. Distal branch vessels appear unremarkable. Posterior circulation: Both vertebral arteries are patent through the foramen magnum to the basilar. Dominant supply is from the right. Posterior inferior cerebellar arteries are patent. The basilar artery shows mild atherosclerotic irregularity but does not have a flow limiting stenosis. Superior cerebellar and posterior cerebral arteries appear normal. Right PCA takes fetal origin. Venous sinuses: Patent and normal. Anatomic variants: None significant. Delayed phase: No abnormal enhancement. Review of the MIP images confirms the above findings IMPRESSION: Carotid bifurcations appear normal. No anterior circulation disease of significance. Tortuous vessels suggesting a history of hypertension. Mild atherosclerotic irregularity of the basilar artery but without flow limiting stenosis. Electronically Signed   By: Paulina Fusi M.D.   On: 07/10/2018 14:12   Dg Chest 2 View  Result Date: 07/10/2018  CLINICAL DATA:  72 year old female with CVA. EXAM: CHEST - 2 VIEW COMPARISON:  None. FINDINGS: The lungs are clear. There is no pleural effusion or pneumothorax. Mild cardiomegaly. Degenerative changes of the shoulders. No acute osseous pathology. IMPRESSION: No active cardiopulmonary disease. Electronically Signed   By: Elgie Collard M.D.   On: 07/10/2018 02:11   Ct Head Wo Contrast  Result Date: 07/09/2018 CLINICAL DATA:  Patient presents with right arm weakness and right leg weakness with right facial droop. Grand daughter also states that her speech is not her usual. EXAM: CT HEAD WITHOUT CONTRAST TECHNIQUE: Contiguous axial images were obtained from the base of the skull through the vertex without intravenous contrast. COMPARISON:  None. FINDINGS: Brain: Age related involutional changes of the brain. Age-indeterminate hypodensity involving the pons on the left. Age-indeterminate small vessel ischemic disease of periventricular and subcortical  white matter. Idiopathic bilateral basal ganglial calcifications are seen. No intra-axial mass nor extra-axial fluid collections. No acute intracranial hemorrhage. Midline fourth ventricle and basal cisterns without effacement. Vascular: No hyperdense vessel sign. Moderate atherosclerosis of the carotid siphons. Skull: Intact Sinuses/Orbits: Nonacute Other: None IMPRESSION: Age-indeterminate infarct of the left pons believed to account for ill-defined hypodensity noted within. Atrophy with age-indeterminate small vessel ischemic disease is also noted though more likely to be chronic given atherosclerosis at the skull base. MRI may prove useful for further correlation. Electronically Signed   By: Tollie Eth M.D.   On: 07/09/2018 23:54   Ct Angio Neck W Or Wo Contrast  Result Date: 07/10/2018 CLINICAL DATA:  Right arm weakness and right leg weakness. Symptoms began about 3 days ago. Abnormal brainstem by CT. EXAM: CT ANGIOGRAPHY HEAD AND NECK TECHNIQUE: Multidetector CT imaging of the head and neck was performed using the standard protocol during bolus administration of intravenous contrast. Multiplanar CT image reconstructions and MIPs were obtained to evaluate the vascular anatomy. Carotid stenosis measurements (when applicable) are obtained utilizing NASCET criteria, using the distal internal carotid diameter as the denominator. CONTRAST:  50mL ISOVUE-370 IOPAMIDOL (ISOVUE-370) INJECTION 76% COMPARISON:  CT done yesterday. FINDINGS: CTA NECK FINDINGS Aortic arch: The aortic arch appears normal. Branching pattern of the brachiocephalic vessels is normal without origin stenosis. Right carotid system: Common carotid artery is tortuous but widely patent to the bifurcation. The carotid bifurcation does not show any plaque, stenosis or irregularity. Cervical ICA is tortuous but widely patent. Left carotid system: Left common carotid artery is widely patent to the bifurcation. No carotid bifurcation atherosclerotic  disease. Cervical ICA is tortuous but widely patent. Vertebral arteries: The right vertebral artery is dominant. Both vertebral artery origins are widely patent. Both vertebral arteries appear normal through the cervical region to the foramen magnum. Skeleton: Ordinary mid cervical spondylosis. Other neck: No soft tissue mass or lymphadenopathy. Upper chest: Negative Review of the MIP images confirms the above findings CTA HEAD FINDINGS Anterior circulation: There is atherosclerotic calcification in both carotid siphon regions but no stenosis more than about 20 or 30%. The anterior and middle cerebral vessels are patent without proximal stenosis, aneurysm or vascular malformation. Distal branch vessels appear unremarkable. Posterior circulation: Both vertebral arteries are patent through the foramen magnum to the basilar. Dominant supply is from the right. Posterior inferior cerebellar arteries are patent. The basilar artery shows mild atherosclerotic irregularity but does not have a flow limiting stenosis. Superior cerebellar and posterior cerebral arteries appear normal. Right PCA takes fetal origin. Venous sinuses: Patent and normal. Anatomic variants: None significant. Delayed phase: No abnormal enhancement.  Review of the MIP images confirms the above findings IMPRESSION: Carotid bifurcations appear normal. No anterior circulation disease of significance. Tortuous vessels suggesting a history of hypertension. Mild atherosclerotic irregularity of the basilar artery but without flow limiting stenosis. Electronically Signed   By: Paulina Fusi M.D.   On: 07/10/2018 14:12   Mr Brain Wo Contrast  Result Date: 07/10/2018 CLINICAL DATA:  Right arm and leg weakness, acute onset. Brainstem stroke. EXAM: MRI HEAD WITHOUT CONTRAST TECHNIQUE: Multiplanar, multiecho pulse sequences of the brain and surrounding structures were obtained without intravenous contrast. COMPARISON:  CT angiography same day.  CT head yesterday.  FINDINGS: Brain: Diffusion imaging shows a 1 x 2 cm acute infarction in the left para median pons. No other acute infarction. No old brainstem insult is identified. There is an old right cerebellar infarction. Cerebral hemispheres show an old lacunar infarction in the right caudate head and mild small vessel change of the hemispheric white matter. No large vessel territory infarction. No mass lesion, hemorrhage, hydrocephalus or extra-axial collection. Vascular: Major vessels at the base of the brain show flow. Skull and upper cervical spine: Negative Sinuses/Orbits: Clear/normal Other: None IMPRESSION: 1 x 2 cm acute infarction in the left para median pons. No evidence of hemorrhage or mass effect. Mild chronic small-vessel change elsewhere as above. Electronically Signed   By: Paulina Fusi M.D.   On: 07/10/2018 14:24     CBC Recent Labs  Lab 07/09/18 2112 07/09/18 2130 07/10/18 0332  WBC 11.6*  --  9.8  HGB 13.5 15.6* 12.5  HCT 46.7* 46.0 42.6  PLT 219  --  174  MCV 75.6*  --  75.1*  MCH 21.8*  --  22.0*  MCHC 28.9*  --  29.3*  RDW 13.5  --  13.4  LYMPHSABS 2.8  --  2.4  MONOABS 0.8  --  0.7  EOSABS 0.3  --  0.3  BASOSABS 0.1  --  0.1    Chemistries  Recent Labs  Lab 07/09/18 2112 07/09/18 2130 07/10/18 0332  NA 140 140 142  K 3.7 3.6 3.7  CL 103 102 106  CO2 25  --  26  GLUCOSE 217* 211* 211*  BUN 9 9 10   CREATININE 0.93 0.80 0.87  CALCIUM 9.2  --  8.8*  AST 14*  --   --   ALT 18  --   --   ALKPHOS 102  --   --   BILITOT 1.2  --   --    ------------------------------------------------------------------------------------------------------------------ Recent Labs    07/10/18 0332  CHOL 209*  HDL 39*  LDLCALC 140*  TRIG 148  CHOLHDL 5.4    Lab Results  Component Value Date   HGBA1C 12.3 (H) 07/10/2018   ------------------------------------------------------------------------------------------------------------------ No results for input(s): TSH, T4TOTAL,  T3FREE, THYROIDAB in the last 72 hours.  Invalid input(s): FREET3 ------------------------------------------------------------------------------------------------------------------ No results for input(s): VITAMINB12, FOLATE, FERRITIN, TIBC, IRON, RETICCTPCT in the last 72 hours.  Coagulation profile Recent Labs  Lab 07/09/18 2112  INR 0.97    No results for input(s): DDIMER in the last 72 hours.  Cardiac Enzymes No results for input(s): CKMB, TROPONINI, MYOGLOBIN in the last 168 hours.  Invalid input(s): CK ------------------------------------------------------------------------------------------------------------------ No results found for: BNP   Shon Hale M.D on 07/11/2018 at 5:21 PM   Go to [email or web address] - password [email or web address] for contact info  [facility] - Office  [phone]

## 2018-07-11 NOTE — Progress Notes (Signed)
SLP Cancellation Note  Patient Details Name: Julieta GuttingHpot Fischel MRN: [id number] DOB: May 10, 1946   Cancelled treatment:       Reason Eval/Treat Not Completed: Other (comment) Interpreter not present - called [employer] and [facility] for [facility]    Blenda MountsCouture, Milliani Herrada Laurice 07/11/2018, 3:45 PM

## 2018-07-11 NOTE — Progress Notes (Signed)
Inpatient Diabetes Program Recommendations  AACE/ADA: New Consensus Statement on Inpatient Glycemic Control (2015)  Target Ranges:  Prepandial:   less than 140 mg/dL      Peak postprandial:   less than 180 mg/dL (1-2 hours)      Critically ill patients:  140 - 180 mg/dL   Lab Results  Component Value Date   GLUCAP 184 (H) 07/11/2018   HGBA1C 12.3 (H) 07/10/2018    Review of Glycemic ControlResults for Julieta GuttingSIU, Kaijah (MRN [id number]) as of 07/11/2018 16:28  Ref. Range 07/10/2018 11:57 07/10/2018 16:53 07/10/2018 21:18 07/11/2018 07:58 07/11/2018 12:05  Glucose-Capillary Latest Ref Range: 70 - 99 mg/dL 952213 (H) 841181 (H) 324273 (H) 211 (H) 184 (H)    Diabetes history: New onset DM  Outpatient Diabetes medications:  None Current orders for Inpatient glycemic control: Novolog sensitive tid with meals and HS Lantus 6 units q HS, Metformin 850 mg bid  Inpatient Diabetes Program Recommendations:   Note new diagnosis of DM.  Attempted to see patient.  She was asleep and son not in room.  I have scheduled an interpreter for 2:15 p with [place].   Note that blood sugars have not been markedly elevated since admit.  ? Whether patient will have adequate control of blood sugars with only a sulfonylurea.   -Consider Amaryl 2 mg daily instead of basal insulin at home? I will talk with patient and family tomorrow.   Thanks,  Beryl MeagerJenny Braleigh Massoud, RN, BC-ADM Inpatient Diabetes Coordinator Pager [phone] (8a-5p)

## 2018-07-11 NOTE — Progress Notes (Signed)
Physical Therapy Treatment Patient Details Name: Samantha Paul MRN: [id number] DOB: Jun 16, 1946 Today's Date: 07/11/2018    History of Present Illness 72 yo [place] female admitted with right weakness with CT demonstrating age indeterminate left pons infarct. No PMHx    PT Comments    Continuing work on functional mobility and activity tolerance;  Notable progress with gait with quad cane and better RLE stepping; fatigued at the end of the walk; Good use of quad cane R and therapist supported L shoulder girdle for more symmetrical gait pattern  Samantha Paul with [employer] was present and facilitated communication.   Follow Up Recommendations  CIR;Supervision/Assistance - 24 hour     Equipment Recommendations  Other (comment);3in1 (PT)(quad cane)    Recommendations for Other Services       Precautions / Restrictions Precautions Precautions: Fall Precaution Comments: right hemiparesis    Mobility  Bed Mobility                  Transfers Overall transfer level: Needs assistance Equipment used: 1 person hand held assist Transfers: Sit to/from Stand Sit to Stand: Min assist         General transfer comment: Min assist to boost up from EOB and for standing balance. Provided HHA thoruhgout  Ambulation/Gait Ambulation/Gait assistance: Mod assist Gait Distance (Feet): 75 Feet Assistive device: Quad cane;1 person hand held assist Gait Pattern/deviations: Decreased step length - right     General Gait Details: Walked the hallway with Quad cane in pt's L hand and PT supporting R UE and shoulder girdle for better symmetry; Occasional very short R step length, leading to loss of balance anteriorly -- improved with cues; fatigued at the end of walk; Pt's son pushed IV pole   Stairs             Wheelchair Mobility    Modified Rankin (Stroke Patients Only) Modified Rankin (Stroke Patients Only) Pre-Morbid Rankin Score: No symptoms Modified Rankin:  Moderately severe disability     Balance Overall balance assessment: Needs assistance   Sitting balance-Leahy Scale: Good       Standing balance-Leahy Scale: Poor Standing balance comment: LUE support in standing                            Cognition Arousal/Alertness: Awake/alert Behavior During Therapy: WFL for tasks assessed/performed;Impulsive(slightly impulsive stand) Overall Cognitive Status: Difficult to assess                                        Exercises      General Comments General comments (skin integrity, edema, etc.): Took time to teach RUE positioning, propped on pillows      Pertinent Vitals/Pain Pain Assessment: No/denies pain    Home Living                      Prior Function            PT Goals (current goals can now be found in the care plan section) Acute Rehab PT Goals Patient Stated Goal: return home PT Goal Formulation: With patient/family Time For Goal Achievement: 07/24/18 Potential to Achieve Goals: Good Progress towards PT goals: Progressing toward goals    Frequency    Min 4X/week      PT Plan Current plan remains appropriate    Co-evaluation  AM-PAC PT "6 Clicks" Daily Activity  Outcome Measure  Difficulty turning over in bed (including adjusting bedclothes, sheets and blankets)?: A Little Difficulty moving from lying on back to sitting on the side of the bed? : Unable Difficulty sitting down on and standing up from a chair with arms (e.g., wheelchair, bedside commode, etc,.)?: A Lot Help needed moving to and from a bed to chair (including a wheelchair)?: A Lot Help needed walking in hospital room?: A Lot Help needed climbing 3-5 steps with a railing? : A Lot 6 Click Score: 12    End of Session Equipment Utilized During Treatment: Gait belt Activity Tolerance: Patient tolerated treatment well Patient left: in chair;with call bell/phone within reach;with  family/visitor present Nurse Communication: Mobility status;Precautions PT Visit Diagnosis: Other abnormalities of gait and mobility (R26.89);Other symptoms and signs involving the nervous system (R29.898);Hemiplegia and hemiparesis;Unsteadiness on feet (R26.81) Hemiplegia - Right/Left: Right Hemiplegia - dominant/non-dominant: Dominant Hemiplegia - caused by: Cerebral infarction     Time: 1610-9604 PT Time Calculation (min) (ACUTE ONLY): 29 min  Charges:  $Gait Training: 23-37 mins                    G Codes:       Samantha Paul, PT  Acute Rehabilitation Services Pager [phone] Office [phone]    Samantha Paul 07/11/2018, 12:14 PM

## 2018-07-12 ENCOUNTER — Encounter (HOSPITAL_COMMUNITY): Payer: Self-pay | Admitting: Physical Medicine and Rehabilitation

## 2018-07-12 LAB — GLUCOSE, CAPILLARY
GLUCOSE-CAPILLARY: 129 mg/dL — AB (ref 70–99)
GLUCOSE-CAPILLARY: 189 mg/dL — AB (ref 70–99)
GLUCOSE-CAPILLARY: 255 mg/dL — AB (ref 70–99)
Glucose-Capillary: 197 mg/dL — ABNORMAL HIGH (ref 70–99)

## 2018-07-12 MED ORDER — LIVING WELL WITH DIABETES BOOK
Freq: Once | Status: DC
  Filled 2018-07-12: qty 1

## 2018-07-12 NOTE — Progress Notes (Addendum)
Inpatient Diabetes Program Recommendations  AACE/ADA: New Consensus Statement on Inpatient Glycemic Control (2015)  Target Ranges:  Prepandial:   less than 140 mg/dL      Peak postprandial:   less than 180 mg/dL (1-2 hours)      Critically ill patients:  140 - 180 mg/dL   Lab Results  Component Value Date   GLUCAP 189 (H) 07/12/2018   HGBA1C 12.3 (H) 07/10/2018    Review of Glycemic Control Results for Samantha Paul, Samantha Paul (MRN [id number]) as of 07/12/2018 16:08  Ref. Range 07/11/2018 12:05 07/11/2018 16:45 07/11/2018 21:42 07/12/2018 08:08 07/12/2018 11:56  Glucose-Capillary Latest Ref Range: 70 - 99 mg/dL 409184 (H) 811263 (H) 914206 (H) 129 (H) 189 (H)   Diabetes history: New onset DM Outpatient Diabetes medications: None Current orders for Inpatient glycemic control:  Novolog sensitive tid with meals and HS, Lantus 6 units q HS, Metformin 850 mg bid Inpatient Diabetes Program Recommendations:    Used interpreter to speak to patient and husband.  We briefly discussed what diabetes is and potential lifestyle modifications for DM.  We discussed eliminating sugar from beverages and drinking more water.  Also we discussed increasing exercise such as walking daily.  Patient states that she does not exercise "but I will start".  I briefly discussed symptoms of high and low blood sugars.  We also discussed treatment of low blood sugars using juice or regular soda.  I attempted teach back with patient regarding treatment of low blood sugars and she states "I will drink water not juice".  I redirected patient and reminder her that low blood sugars are dangerous and she needs to treat with juice or regular soda.  At this point, I am not sure that patient and husband can handle insulin at home.  ? Whether she would be okay with only oral agents (such as Metformin and Amaryl 1 mg daily). Will reorder Living well with diabetes booklet for patient's son as well.  Will need continued education and reinforcement.     Thanks,   Beryl MeagerJenny Rosmery Duggin, RN, BC-ADM Inpatient Diabetes Coordinator Pager [phone] (8a-5p)

## 2018-07-12 NOTE — Plan of Care (Signed)
  Problem: Education: Goal: Knowledge of secondary prevention will improve Outcome: Progressing Goal: Knowledge of patient specific risk factors addressed and post discharge goals established will improve Outcome: Progressing   Problem: Coping: Goal: Will identify appropriate support needs Outcome: Progressing   Problem: Health Behavior/Discharge Planning: Goal: Ability to identify and utilize available resources and services will improve Outcome: Progressing Goal: Ability to manage health-related needs will improve Outcome: Progressing   Problem: Metabolic: Goal: Ability to maintain appropriate glucose levels will improve Outcome: Progressing   Problem: Nutritional: Goal: Maintenance of adequate nutrition will improve Outcome: Progressing Goal: Progress toward achieving an optimal weight will improve Outcome: Progressing   Problem: Skin Integrity: Goal: Risk for impaired skin integrity will decrease Outcome: Progressing

## 2018-07-12 NOTE — PMR Pre-admission (Signed)
PMR Admission Coordinator Pre-Admission Assessment  Patient: Samantha Paul is an 72 y.o., female MRN: [id number] DOB: 1946-08-08 Height: 4\' 10"  (147.3 cm) Weight: 58.9 kg (129 lb 13.6 oz)              Insurance Information HMO: Yes    PPO:      PCP:      IPA:      80/20:      OTHER:  PRIMARY: U[employer] Medicare      Policy#: [id number]      Subscriber: Patient CM Name: Rebeca Alert      Phone#: [phone]     Fax#: [phone] Auth provided by [employer]e [employer] on 07/13/18 with pt approved for 7 days (admit day 07/13/18, with updates due 07/20/18 to Rebeca Alert) Pre-Cert#: [id number]      Employer:  Benefits:  Phone #: NA     Name: Online Portal- [email or web address] Eff. Date: 12/26/17     Deduct: $0      Out of Pocket Max: $6,700      Life Max: NA CIR: $430/day for days 1-4; $0/day for days 5+      SNF: $0/day for days 1-20; $160/day for days 21-62, $0/day for days 63-100 (100 day SNF limit) Outpatient: per necessirty     Co-Pay: $40/visit Home Health: per necessity 100%      Co-Pay:  DME: 80%     Co-Pay: 20% Providers:   Medicaid Application Date:       Case Manager:  Disability Application Date:       Case Worker:   Emergency Contact Information Contact Information    Name Relation Home Work Mobile   Huetter Spouse   [phone]   Raynor,[name]   [phone]   Asmara, Backs   [phone]     Current Medical History  Patient Admitting Diagnosis: Left Pontine Infarct History of Present Illness: Samantha Paul is a 72 y.o. non-english speaking [place] dialect[place]  female who was admitted on 07/09/18 with 2 day history of right sided weakness, slurred speech and difficulty with ambulation. Patient with history of HTN and no medications X years per son. BP at admission 193/89 and MRI brain revealed 1 X 2 cm acute left paramedian pontine infarct.  CTA head/neck was negative for flow limiting stenosis. 2 D echo showed EF 65- 70% with moderate focal basal hypertrophy with moderate proximal septal thickening and  findings s/o of HOCM. Dr. Roda Shutters felt that stroke was due to small vessel disease and recommended aggressive management of stroke risk factors with  DAPT X 3 weeks followed by ASA or Plavix alone. Patient with new diagnosis of DM with Hgb A1c- 12.3. Patient limited by right sided weakness with balance deficits. CIR recommended for follow up therapy. Pt is to be admitted to CIR on 07/13/18.    Total: 6    Past Medical History  Past Medical History:  Diagnosis Date  . Back pain   . HTN (hypertension)   . Knee pain     Family History  family history includes Arthritis in her brother.  Prior Rehab/Hospitalizations:  Has the patient had major surgery during 100 days prior to admission? No  Current Medications   Current Facility-Administered Medications:  .  0.9 %  sodium chloride infusion, , Intravenous, Continuous, Marvel Plan, MD, Last Rate: 75 mL/hr at 07/13/18 0800 .  acetaminophen (TYLENOL) tablet 650 mg, 650 mg, Oral, Q4H PRN, 650 mg at 07/10/18 1949 **OR** acetaminophen (TYLENOL) solution 650 mg, 650 mg, Per Tube,  Q4H PRN **OR** acetaminophen (TYLENOL) suppository 650 mg, 650 mg, Rectal, Q4H PRN, Smith, Rondell A, MD .  amLODipine (NORVASC) tablet 10 mg, 10 mg, Oral, Daily, Marvel PlanXu, Jindong, MD, 10 mg at 07/13/18 0909 .  aspirin EC tablet 81 mg, 81 mg, Oral, Daily, Katrinka BlazingSmith, Rondell A, MD, 81 mg at 07/13/18 0909 .  atorvastatin (LIPITOR) tablet 40 mg, 40 mg, Oral, q1800, Madelyn FlavorsSmith, Rondell A, MD, 40 mg at 07/12/18 1713 .  clopidogrel (PLAVIX) tablet 75 mg, 75 mg, Oral, Daily, Marvel PlanXu, Jindong, MD, 75 mg at 07/13/18 0909 .  enoxaparin (LOVENOX) injection 40 mg, 40 mg, Subcutaneous, Q24H, Smith, Rondell A, MD, 40 mg at 07/13/18 [id number] .  hydrALAZINE (APRESOLINE) injection 10 mg, 10 mg, Intravenous, Q4H PRN, Katrinka BlazingSmith, Rondell A, MD, 10 mg at 07/10/18 0454 .  insulin aspart (novoLOG) injection 0-5 Units, 0-5 Units, Subcutaneous, QHS, Clydie BraunSmith, Rondell A, MD, 2 Units at 07/11/18 2156 .  insulin aspart (novoLOG)  injection 0-9 Units, 0-9 Units, Subcutaneous, TID WC, Clydie BraunSmith, Rondell A, MD, 2 Units at 07/13/18 0804 .  insulin glargine (LANTUS) injection 7 Units, 7 Units, Subcutaneous, QHS, Emokpae, Courage, MD .  living well with diabetes book MISC, , Does not apply, Once, Emokpae, Courage, MD .  losartan (COZAAR) tablet 50 mg, 50 mg, Oral, Daily, Marvel PlanXu, Jindong, MD, 50 mg at 07/13/18 0909 .  metFORMIN (GLUCOPHAGE) tablet 850 mg, 850 mg, Oral, BID WC, Emokpae, Courage, MD, 850 mg at 07/13/18 0804 .  metoprolol tartrate (LOPRESSOR) tablet 25 mg, 25 mg, Oral, BID, Emokpae, Courage, MD, 25 mg at 07/13/18 0908 .  senna-docusate (Senokot-S) tablet 2 tablet, 2 tablet, Oral, QHS, Emokpae, Courage, MD, 2 tablet at 07/12/18 2159  Patients Current Diet:  Diet Order           Diet - low sodium heart healthy        Diet Carb Modified        Diet Carb Modified Fluid consistency: Thin; Room service appropriate? Yes  Diet effective now          Precautions / Restrictions Precautions Precautions: Fall Precaution Comments: right hemiparesis, some inattention Restrictions Weight Bearing Restrictions: No   Has the patient had 2 or more falls or a fall with injury in the past year?No  Prior Activity Level Community (5-7x/wk): Yes; but was not working or driving.   Home Assistive Devices / Equipment Home Assistive Devices/Equipment: None Home Equipment: None  Prior Device Use: Indicate devices/aids used by the patient prior to current illness, exacerbation or injury? None of the above  Prior Functional Level Prior Function Level of Independence: Independent  Self Care: Did the patient need help bathing, dressing, using the toilet or eating?  Independent  Indoor Mobility: Did the patient need assistance with walking from room to room (with or without device)? Independent  Stairs: Did the patient need assistance with internal or external stairs (with or without device)? Independent  Functional Cognition:  Did the patient need help planning regular tasks such as shopping or remembering to take medications? Independent  Current Functional Level Cognition  Overall Cognitive Status: Within Functional Limits for tasks assessed Difficult to assess due to: Non-English speaking Orientation Level: Oriented X4 Safety/Judgement: Decreased awareness of deficits, Decreased awareness of safety General Comments: Pt appears to follow commands appropriately    Extremity Assessment (includes Sensation/Coordination)  Upper Extremity Assessment: RUE deficits/detail RUE Deficits / Details: pt with limited strength grossly 2-/5 elbow flexion, no noted digits movement, very limited shoulder flexion, grossly intact light touch  with limited proprioception RUE Sensation: decreased proprioception RUE Coordination: decreased fine motor, decreased gross motor  Lower Extremity Assessment: Defer to PT evaluation RLE Deficits / Details: grossly 2/5 but difficult to fully assess due to language barrier    ADLs  Overall ADL's : Needs assistance/impaired Eating/Feeding: Minimal assistance, Sitting Eating/Feeding Details (indicate cue type and reason): Pt reporting she has been self feeding with L hand Grooming: Moderate assistance, Sitting, Wash/dry hands Upper Body Bathing: Moderate assistance, Sitting Lower Body Bathing: Maximal assistance, Sit to/from stand Upper Body Dressing : Moderate assistance, Sitting Lower Body Dressing: Maximal assistance, Sit to/from stand Toilet Transfer: Moderate assistance, Ambulation Toilet Transfer Details (indicate cue type and reason): HHA, simulated by functional mobility in room Functional mobility during ADLs: Moderate assistance(HHA)    Mobility  Overal bed mobility: Needs Assistance Bed Mobility: Supine to Sit, Sit to Supine Supine to sit: Min assist(assist with pad to bring hips EOB and trunk elevation) Sit to supine: Min assist(assist for RLE back into bed) General bed  mobility comments: sitting EOB upon my entry    Transfers  Overall transfer level: Needs assistance Equipment used: 1 person hand held assist Transfers: Sit to/from Stand Sit to Stand: Min assist General transfer comment: pt up from EOB assist for balance    Ambulation / Gait / Stairs / Wheelchair Mobility  Ambulation/Gait Ambulation/Gait assistance: Mod assist Gait Distance (Feet): 120 Feet Assistive device: 1 person hand held assist, Quad cane Gait Pattern/deviations: Step-to pattern, Step-through pattern, Decreased stance time - right, Drifts right/left General Gait Details: cues for placement of cane, assist for R UE about 80% of the walk, at times held it at her side with some evidence of increasing tone, facilitation at ribs for L weight shift to ensure clearance of R foot Gait velocity interpretation: <1.8 ft/sec, indicate of risk for recurrent falls    Posture / Balance Balance Overall balance assessment: Needs assistance Sitting-balance support: Feet unsupported Sitting balance-Leahy Scale: Good Standing balance support: Single extremity supported Standing balance-Leahy Scale: Poor Standing balance comment: LUE support in standing High Level Balance Comments: sit<>stand for balance and LE strength with placement of R LE and cues for weight shift x 5 reps    Special needs/care consideration BiPAP/CPAP: No CPM: No Continuous Drip IV: No Dialysis: No        Days: No Life Vest: No Oxygen: No Special Bed: No Trach Size: No Wound Vac (area): No      Location: NA Skin: bruising to L forearm                              Location Bowel mgmt: [facility]; Last BM according to chart, 07/12/18 Bladder mgmt:[facility] Diabetic mgmt: pt is new diabetic with possible need for education and training.      Previous Home Environment Living Arrangements: Spouse/significant other Available Help at Discharge: Family, Available 24 hours/day Type of Home: House Home Layout: One  level Home Access: Stairs to enter [employer] of Steps: 2 Bathroom Shower/Tub: Engineer, manufacturing systems: Standard Home Care Services: No  Discharge Living Setting Plans for Discharge Living Setting: Patient's home, Lives with (comment)(lives with husband, son, and 4 grandchildren) Type of Home at Discharge: House Discharge Home Layout: One level Discharge Home Access: Stairs to enter Entrance Stairs-Rails: Can reach both Entrance Stairs-Number of Steps: 3 Discharge Bathroom Shower/Tub: Tub/shower unit Discharge Bathroom Toilet: Standard Discharge Bathroom Accessibility: Yes How Accessible: Accessible via walker Does the patient  have any problems obtaining your medications?: No  Social/Family/Support Systems Patient Roles: Spouse Contact Information: husband is emergency contact Anticipated Caregiver: husband, son, family Anticipated Caregiver's Contact Information: husband Buddy Duty) [phone] Ability/Limitations of Caregiver: husband has LUE deformity (broken per his report) and gout in RUE currently but able to assist as able; son is physically able to assist Caregiver Availability: 24/7  Discharge Plan Discussed with Primary Caregiver: Yes Is Caregiver In Agreement with Plan?: Yes Does Caregiver/Family have Issues with Lodging/Transportation while Pt is in Rehab?: No   Goals/Additional Needs Patient/Family Goal for Rehab: PT: Mod I; OT: Mod I/Sup; SLP: NA Expected length of stay: 7-12 days Cultural Considerations: Protestant Christian; Therapist, sports (dialet spoken and understood) Dietary Needs: Carb Modified; 1600-2000 calorie; thin liquids Equipment Needs: TBD Special Service Needs: Interpreter services needed: dialet is [place] Pt/Family Agrees to Admission and willing to participate: Yes Program Orientation Provided & Reviewed with Pt/Caregiver Including Roles  & Responsibilities: Yes(pt and husband; info provided for son)  Barriers to Discharge: Home  environment access/layout, Other (comments)  Barriers to Discharge Comments: pt is new diabetic and would benefit from training   Decrease burden of Care through IP rehab admission: NA   Possible need for SNF placement upon discharge:Not anticipated; pt has 24/7 A at home with large family and good support.    Patient Condition: This patient's condition remains as documented in the consult dated 07/11/18, in which the Rehabilitation Physician determined and documented that the patient's condition is appropriate for intensive rehabilitative care in an inpatient rehabilitation facility. Will admit to inpatient rehab today.  Preadmission Screen Completed By:  Nanine Means, 07/13/2018 11:06 AM ______________________________________________________________________   Discussed status with Dr. Riley Kill on 07/13/18 at 11:06PM and received telephone approval for admission today.  Admission Coordinator:  Nanine Means, time 11:06PM Dorna Bloom 07/13/18.

## 2018-07-12 NOTE — Progress Notes (Signed)
Gave pt family letter for airport from Dr. Shon Haleourage Emokpae.

## 2018-07-12 NOTE — Progress Notes (Signed)
Physical Therapy Treatment Patient Details Name: Samantha Paul MRN: [id number] DOB: 1946/10/15 Today's Date: 07/12/2018    History of Present Illness 72 yo [place] female admitted with right weakness with CT demonstrating age indeterminate left pons infarct. No PMHx    PT Comments    Patient progressing with gait and with R UE use.  Able to demonstrate active shoulder, elbow and finger movement.  Continues to need assist for ambulation to prevent falling due to R side inattention and limited progression of R LE without assisted weight shifts.  Continue to recommend CIR level rehab at d/c.   Follow Up Recommendations  CIR;Supervision/Assistance - 24 hour     Equipment Recommendations  Cane(potentially quad)    Recommendations for Other Services       Precautions / Restrictions Precautions Precautions: Fall Precaution Comments: right hemiparesis, some inattention Restrictions Weight Bearing Restrictions: No    Mobility  Bed Mobility Overal bed mobility: Needs Assistance Bed Mobility: Supine to Sit;Sit to Supine     Supine to sit: Min assist(assist with pad to bring hips EOB and trunk elevation) Sit to supine: Min assist(assist for RLE back into bed)   General bed mobility comments: sitting EOB upon my entry  Transfers Overall transfer level: Needs assistance Equipment used: 1 person hand held assist Transfers: Sit to/from Stand Sit to Stand: Min assist         General transfer comment: pt up from EOB assist for balance  Ambulation/Gait Ambulation/Gait assistance: Mod assist Gait Distance (Feet): 120 Feet Assistive device: 1 person hand held assist;Quad cane Gait Pattern/deviations: Step-to pattern;Step-through pattern;Decreased stance time - right;Drifts right/left     General Gait Details: cues for placement of cane, assist for R UE about 80% of the walk, at times held it at her side with some evidence of increasing tone, facilitation at ribs for L weight shift  to ensure clearance of R foot   Stairs             Wheelchair Mobility    Modified Rankin (Stroke Patients Only) Modified Rankin (Stroke Patients Only) Pre-Morbid Rankin Score: No symptoms Modified Rankin: Moderately severe disability     Balance Overall balance assessment: Needs assistance Sitting-balance support: Feet unsupported Sitting balance-Leahy Scale: Good     Standing balance support: Single extremity supported Standing balance-Leahy Scale: Poor Standing balance comment: LUE support in standing               High Level Balance Comments: sit<>stand for balance and LE strength with placement of R LE and cues for weight shift x 5 reps            Cognition Arousal/Alertness: Awake/alert Behavior During Therapy: WFL for tasks assessed/performed Overall Cognitive Status: Within Functional Limits for tasks assessed                                 General Comments: Pt appears to follow commands appropriately      Exercises General Exercises - Upper Extremity Shoulder Flexion: Self ROM;Right;Seated;10 reps Shoulder Extension: Self ROM;Right;10 reps;Seated Shoulder ABduction: Self ROM;Right;10 reps;Seated Elbow Flexion: AAROM;Right;10 reps;Seated Elbow Extension: AAROM;Right;10 reps;Seated Wrist Flexion: PROM;Right;10 reps;Seated Wrist Extension: PROM;Right;10 reps;Seated Digit Composite Flexion: PROM;Right;10 reps;Seated Composite Extension: PROM;Right;10 reps;Seated    General Comments General comments (skin integrity, edema, etc.): Interpreter present in person from [employer]      Pertinent Vitals/Pain Pain Assessment: No/denies pain    Home Living  Prior Function            PT Goals (current goals can now be found in the care plan section) Acute Rehab PT Goals Patient Stated Goal: "make my hand work again" Progress towards PT goals: Progressing toward goals    Frequency    Min  4X/week      PT Plan Current plan remains appropriate    Co-evaluation              AM-PAC PT "6 Clicks" Daily Activity  Outcome Measure  Difficulty turning over in bed (including adjusting bedclothes, sheets and blankets)?: A Little Difficulty moving from lying on back to sitting on the side of the bed? : Unable Difficulty sitting down on and standing up from a chair with arms (e.g., wheelchair, bedside commode, etc,.)?: Unable Help needed moving to and from a bed to chair (including a wheelchair)?: A Lot Help needed walking in hospital room?: A Lot Help needed climbing 3-5 steps with a railing? : A Lot 6 Click Score: 11    End of Session Equipment Utilized During Treatment: Gait belt Activity Tolerance: Patient tolerated treatment well Patient left: in bed;with call bell/phone within reach;with family/visitor present;Other (comment);with bed alarm set(pt sitting EOB; rehab MD present)   PT Visit Diagnosis: Other abnormalities of gait and mobility (R26.89);Other symptoms and signs involving the nervous system (R29.898);Hemiplegia and hemiparesis;Unsteadiness on feet (R26.81) Hemiplegia - Right/Left: Right Hemiplegia - dominant/non-dominant: Dominant Hemiplegia - caused by: Cerebral infarction     Time: 1610-9604 PT Time Calculation (min) (ACUTE ONLY): 26 min  Charges:  $Gait Training: 8-22 mins $Therapeutic Activity: 8-22 mins                    G CodesSheran Paul, Samantha Paul [phone] 07/12/2018    Samantha Paul 07/12/2018, 5:43 PM

## 2018-07-12 NOTE — Plan of Care (Signed)
Nutrition Education Note.  RD consulted for nutrition education regarding diabetes. Spoke with pt and husband at bedside. Interpreter present during visit.  Pt reports that she spoke with the Diabetes Coordinator earlier today. Pt reports that she learned that she needs to reduce the amount of soda that she drinks and "drink only water."  RD obtained dietary recall of a typical day. Pt reports that she typically consumes 2-3 meals daily. Pt may not eat at lunchtime if she is not hungry. Pt's husband shares that they do not eat at set mealtimes. A typical breakfast may include rice, meat, and vegetables. Pt reports also consuming cassava leaves and pork. Pt drinks Sprite throughout the day and "can barely finish a bottle of water." Discussed ways for pt to flavor water so that it is more appealing.  Lab Results  Component Value Date   HGBA1C 12.3 (H) 07/10/2018    RD provided "Carbohydrate Counting for People with Diabetes" handout from the Academy of Nutrition and Dietetics. Handout is in [place] and unavailable in pt's dialect; however, pt's son speaks [place]. Pt states she will save the handout for him.  Discussed different food groups and their effects on blood sugar, emphasizing carbohydrate-containing foods. Provided list of carbohydrates and recommended serving sizes of common foods.  Discussed importance of controlled and consistent carbohydrate intake throughout the day. Provided examples of ways to balance meals/snacks and encouraged intake of high-fiber, whole grain complex carbohydrates. Teach back method used.  Expect fair compliance.  Body mass index is 27.14 kg/m. Pt meets criteria for "Overweight" based on current BMI.  Current diet order is Carb Modified, patient is consuming approximately 100% of meals at this time. Labs and medications reviewed. No further nutrition interventions warranted at this time. RD contact information provided. If additional nutrition issues arise,  please re-consult RD.   Earma ReadingKate Jablonski Jamaira Sherk, MS, RD, LDN Pager: [phone] Weekend/After Hours: [phone]

## 2018-07-12 NOTE — Progress Notes (Signed)
Occupational Therapy Treatment Patient Details Name: Samantha Paul MRN: [id number] DOB: January 27, 1946 Today's Date: 07/12/2018    History of present illness 72 yo [place] female admitted with right weakness with CT demonstrating age indeterminate left pons infarct. No PMHx   OT comments  Pt progressing towards OT goals this session. Focus was establishing HEP for RUE. Pt continues with deficits. Encouraged PROM where no active is currently. Please see below for full Exercise List. Pt also to demonstrate increased independence in transfers sit <>stand this session. Current POC remains appropriate as well as CIR post-acute.    Follow Up Recommendations  CIR;Supervision/Assistance - 24 hour    Equipment Recommendations  Other (comment)(defer to next venue)    Recommendations for Other Services      Precautions / Restrictions Precautions Precautions: Fall Precaution Comments: right hemiparesis, some inattention Restrictions Weight Bearing Restrictions: No       Mobility Bed Mobility Overal bed mobility: Needs Assistance Bed Mobility: Supine to Sit;Sit to Supine     Supine to sit: Min assist(assist with pad to bring hips EOB and trunk elevation) Sit to supine: Min assist(assist for RLE back into bed)   General bed mobility comments: sitting EOB upon my entry  Transfers Overall transfer level: Needs assistance Equipment used: 1 person hand held assist Transfers: Sit to/from Stand Sit to Stand: Min assist         General transfer comment: pt up from EOB assist for balance    Balance Overall balance assessment: Needs assistance Sitting-balance support: Feet unsupported Sitting balance-Leahy Scale: Good     Standing balance support: Single extremity supported Standing balance-Leahy Scale: Poor Standing balance comment: LUE support in standing               High Level Balance Comments: sit<>stand for balance and LE strength with placement of R LE and cues for weight  shift x 5 reps           ADL either performed or assessed with clinical judgement   ADL Overall ADL's : Needs assistance/impaired     Grooming: Moderate assistance;Sitting;Wash/dry hands                                       Vision       Perception     Praxis      Cognition Arousal/Alertness: Awake/alert Behavior During Therapy: WFL for tasks assessed/performed Overall Cognitive Status: Within Functional Limits for tasks assessed                                 General Comments: Pt appears to follow commands appropriately        Exercises Exercises: General Upper Extremity General Exercises - Upper Extremity Shoulder Flexion: Self ROM;Right;Seated;10 reps Shoulder Extension: Self ROM;Right;10 reps;Seated Shoulder ABduction: Self ROM;Right;10 reps;Seated Elbow Flexion: AAROM;Right;10 reps;Seated Elbow Extension: AAROM;Right;10 reps;Seated Wrist Flexion: PROM;Right;10 reps;Seated Wrist Extension: PROM;Right;10 reps;Seated Digit Composite Flexion: PROM;Right;10 reps;Seated Composite Extension: PROM;Right;10 reps;Seated   Shoulder Instructions       General Comments Interpreter present in person from [employer]    Pertinent Vitals/ Pain       Pain Assessment: No/denies pain  Home Living  Prior Functioning/Environment              Frequency  Min 3X/week        Progress Toward Goals  OT Goals(current goals can now be found in the care plan section)  Progress towards OT goals: Progressing toward goals  Acute Rehab OT Goals Patient Stated Goal: "make my hand work again" OT Goal Formulation: With patient/family Time For Goal Achievement: 07/24/18 Potential to Achieve Goals: Good  Plan Discharge plan remains appropriate;Frequency remains appropriate    Co-evaluation                 AM-PAC PT "6 Clicks" Daily Activity     Outcome Measure    Help from another person eating meals?: A Little Help from another person taking care of personal grooming?: A Lot Help from another person toileting, which includes using toliet, bedpan, or urinal?: A Lot Help from another person bathing (including washing, rinsing, drying)?: A Lot Help from another person to put on and taking off regular upper body clothing?: A Lot Help from another person to put on and taking off regular lower body clothing?: A Lot 6 Click Score: 13    End of Session    OT Visit Diagnosis: Unsteadiness on feet (R26.81);Other abnormalities of gait and mobility (R26.89);Hemiplegia and hemiparesis Hemiplegia - Right/Left: Right Hemiplegia - dominant/non-dominant: Dominant Hemiplegia - caused by: Cerebral infarction   Activity Tolerance Patient tolerated treatment well   Patient Left in bed;with call bell/phone within reach;with family/visitor present   Nurse Communication Mobility status(in room with Pt)        Time: [id number] OT Time Calculation (min): 25 min  Charges: OT General Charges $OT Visit: 1 Visit OT Treatments $Therapeutic Activity: 23-37 mins  Sherryl MangesLaura Ozzie Remmers OTR/L [phone]  Evern BioLaura J Jakki Doughty 07/12/2018, 5:44 PM

## 2018-07-12 NOTE — Progress Notes (Signed)
Inpatient Rehabilitation-Admissions Coordinator    Met with patient and husband (interpreter services present) at the bedside to discuss team's recommendation for inpatient rehabilitation. Shared booklets, expectations while in CIR, and expected length of stay. Pt and husband initially concerned over co-pay and said they will need to discuss the matter with their children; They plan to have an answer regarding dispo preference by tomorrow morning. Plan to follow for timing of medical readiness, family decision on rehab venue, and IP Rehab bed availability. Call if questions.   Jhonnie Garner, OTR/L  Rehab Admissions Coordinator  [phone] 07/12/2018 3:41 PM

## 2018-07-12 NOTE — Progress Notes (Signed)
Patient Demographics:    Samantha Paul, is a 72 y.o. female, DOB - 04/07/1946, NWG:[id number]  Admit date - 07/09/2018   Admitting Physician Clydie Braun, MD  Outpatient Primary MD for the patient is Patient, No Pcp Per  LOS - 2  Chief Complaint  Patient presents with  . Stroke Symptoms       Subjective:    Samantha Paul today has no fevers, no emesis,  No chest pain, official interpreter and patient's son at bedside  Assessment  & Plan :    Active Problems:   CVA (cerebral vascular accident) [facility])   Hypertensive urgency   Hyperglycemia   Right sided weakness   Hyperlipidemia   Brief summary  72 year old [place] female admitted on 07/10/2018 found to have acute CVA, prior to admission patient had no significant past medical history that she knew of however during this admission she has been found to have diabetes and hypertension  Plan:- 1) acute stroke--- overall right-sided weakness is improving,  right upper extremity weakness persist, neurology input appreciated, continue Plavix and aspirin for 3 weeks and then Plavix alone after that, continue Lipitor 40 mg daily, awaiting transfer to inpatient rehab  2)HTN-continue amlodipine 10 mg daily and losartan 50 mg daily,,  may use IV Hydralazine 10 mg  Every 4 hours Prn for systolic blood pressure over 180 mmhg  3)DM--diagnosis, A1c above 12, give metformin 850 mg twice daily, diabetic education diabetic diet, start Lantus insulin 6 units nightly, Use Novolog/Humalog Sliding scale insulin with Accu-Cheks/Fingersticks as ordered    Code Status : full    Disposition Plan  : CIR   Consults  : Physical therapy, inpatient rehab consult, neurology consult   DVT Prophylaxis  :  Lovenox   Lab Results  Component Value Date   PLT 174 07/10/2018    Inpatient Medications  Scheduled Meds: . amLODipine  10 mg Oral Daily  . aspirin EC  81 mg Oral  Daily  . atorvastatin  40 mg Oral q1800  . clopidogrel  75 mg Oral Daily  . enoxaparin (LOVENOX) injection  40 mg Subcutaneous Q24H  . insulin aspart  0-5 Units Subcutaneous QHS  . insulin aspart  0-9 Units Subcutaneous TID WC  . insulin glargine  6 Units Subcutaneous QHS  . living well with diabetes book   Does not apply Once  . losartan  50 mg Oral Daily  . metFORMIN  850 mg Oral BID WC  . senna-docusate  2 tablet Oral QHS   Continuous Infusions: . sodium chloride 75 mL/hr at 07/12/18 1200   PRN Meds:.acetaminophen **OR** acetaminophen (TYLENOL) oral liquid 160 mg/5 mL **OR** acetaminophen, hydrALAZINE    Anti-infectives (From admission, onward)   None        Objective:   Vitals:   07/12/18 0000 07/12/18 0400 07/12/18 0806 07/12/18 1135  BP:   (!) 151/76 (!) 156/81  Pulse:   76 80  Resp:   14 14  Temp: 98.2 F (36.8 C) 98.2 F (36.8 C) 98.4 F (36.9 C) 98.2 F (36.8 C)  TempSrc: Oral Oral Oral Oral  SpO2:   99% 98%  Weight:      Height:        Wt Readings from Last 3 Encounters:  07/10/18 58.9 kg (129 lb 13.6 oz)     Intake/Output Summary (Last 24 hours) at 07/12/2018 1552 Last data filed at 07/12/2018 1200 Gross per 24 hour  Intake 1679.78 ml  Output 350 ml  Net 1329.78 ml     Physical Exam  Gen:- Awake Alert,  In no apparent distress  HEENT:- [place].AT, No sclera icterus, facial asymmetry noted Neck-Supple Neck,No JVD,.  Lungs-  CTAB , good air movement CV- S1, S2 normal Abd-  +ve B.Sounds, Abd Soft, No tenderness,    Extremity/Skin:- No  edema,   good pulses Psych-affect is appropriate, oriented x3 Neuro-upper extremity weakness is improving, right upper extremity strength is 3/5, right lower extremity strength is 4/5, no significant left-sided deficits   Data Review:   Micro Results Recent Results (from the past 240 hour(s))  MRSA PCR Screening     Status: None   Collection Time: 07/10/18  5:46 AM  Result Value Ref Range Status   MRSA by PCR  NEGATIVE NEGATIVE Final    Comment:        The GeneXpert MRSA Assay (FDA approved for NASAL specimens only), is one component of a comprehensive MRSA colonization surveillance program. It is not intended to diagnose MRSA infection nor to guide or monitor treatment for MRSA infections. Performed at [facility], [address], [place], [place] [address]     Radiology Reports Ct Angio Head W Or Wo Contrast  Result Date: 07/10/2018 CLINICAL DATA:  Right arm weakness and right leg weakness. Symptoms began about 3 days ago. Abnormal brainstem by CT. EXAM: CT ANGIOGRAPHY HEAD AND NECK TECHNIQUE: Multidetector CT imaging of the head and neck was performed using the standard protocol during bolus administration of intravenous contrast. Multiplanar CT image reconstructions and MIPs were obtained to evaluate the vascular anatomy. Carotid stenosis measurements (when applicable) are obtained utilizing NASCET criteria, using the distal internal carotid diameter as the denominator. CONTRAST:  50mL ISOVUE-370 IOPAMIDOL (ISOVUE-370) INJECTION 76% COMPARISON:  CT done yesterday. FINDINGS: CTA NECK FINDINGS Aortic arch: The aortic arch appears normal. Branching pattern of the brachiocephalic vessels is normal without origin stenosis. Right carotid system: Common carotid artery is tortuous but widely patent to the bifurcation. The carotid bifurcation does not show any plaque, stenosis or irregularity. Cervical ICA is tortuous but widely patent. Left carotid system: Left common carotid artery is widely patent to the bifurcation. No carotid bifurcation atherosclerotic disease. Cervical ICA is tortuous but widely patent. Vertebral arteries: The right vertebral artery is dominant. Both vertebral artery origins are widely patent. Both vertebral arteries appear normal through the cervical region to the foramen magnum. Skeleton: Ordinary mid cervical spondylosis. Other neck: No soft tissue mass or lymphadenopathy.  Upper chest: Negative Review of the MIP images confirms the above findings CTA HEAD FINDINGS Anterior circulation: There is atherosclerotic calcification in both carotid siphon regions but no stenosis more than about 20 or 30%. The anterior and middle cerebral vessels are patent without proximal stenosis, aneurysm or vascular malformation. Distal branch vessels appear unremarkable. Posterior circulation: Both vertebral arteries are patent through the foramen magnum to the basilar. Dominant supply is from the right. Posterior inferior cerebellar arteries are patent. The basilar artery shows mild atherosclerotic irregularity but does not have a flow limiting stenosis. Superior cerebellar and posterior cerebral arteries appear normal. Right PCA takes fetal origin. Venous sinuses: Patent and normal. Anatomic variants: None significant. Delayed phase: No abnormal enhancement. Review of the MIP images confirms the above findings IMPRESSION: Carotid bifurcations appear  normal. No anterior circulation disease of significance. Tortuous vessels suggesting a history of hypertension. Mild atherosclerotic irregularity of the basilar artery but without flow limiting stenosis. Electronically Signed   By: Paulina Fusi M.D.   On: 07/10/2018 14:12   Dg Chest 2 View  Result Date: 07/10/2018 CLINICAL DATA:  72 year old female with CVA. EXAM: CHEST - 2 VIEW COMPARISON:  None. FINDINGS: The lungs are clear. There is no pleural effusion or pneumothorax. Mild cardiomegaly. Degenerative changes of the shoulders. No acute osseous pathology. IMPRESSION: No active cardiopulmonary disease. Electronically Signed   By: Elgie Collard M.D.   On: 07/10/2018 02:11   Ct Head Wo Contrast  Result Date: 07/09/2018 CLINICAL DATA:  Patient presents with right arm weakness and right leg weakness with right facial droop. Grand daughter also states that her speech is not her usual. EXAM: CT HEAD WITHOUT CONTRAST TECHNIQUE: Contiguous axial images  were obtained from the base of the skull through the vertex without intravenous contrast. COMPARISON:  None. FINDINGS: Brain: Age related involutional changes of the brain. Age-indeterminate hypodensity involving the pons on the left. Age-indeterminate small vessel ischemic disease of periventricular and subcortical white matter. Idiopathic bilateral basal ganglial calcifications are seen. No intra-axial mass nor extra-axial fluid collections. No acute intracranial hemorrhage. Midline fourth ventricle and basal cisterns without effacement. Vascular: No hyperdense vessel sign. Moderate atherosclerosis of the carotid siphons. Skull: Intact Sinuses/Orbits: Nonacute Other: None IMPRESSION: Age-indeterminate infarct of the left pons believed to account for ill-defined hypodensity noted within. Atrophy with age-indeterminate small vessel ischemic disease is also noted though more likely to be chronic given atherosclerosis at the skull base. MRI may prove useful for further correlation. Electronically Signed   By: Tollie Eth M.D.   On: 07/09/2018 23:54   Ct Angio Neck W Or Wo Contrast  Result Date: 07/10/2018 CLINICAL DATA:  Right arm weakness and right leg weakness. Symptoms began about 3 days ago. Abnormal brainstem by CT. EXAM: CT ANGIOGRAPHY HEAD AND NECK TECHNIQUE: Multidetector CT imaging of the head and neck was performed using the standard protocol during bolus administration of intravenous contrast. Multiplanar CT image reconstructions and MIPs were obtained to evaluate the vascular anatomy. Carotid stenosis measurements (when applicable) are obtained utilizing NASCET criteria, using the distal internal carotid diameter as the denominator. CONTRAST:  50mL ISOVUE-370 IOPAMIDOL (ISOVUE-370) INJECTION 76% COMPARISON:  CT done yesterday. FINDINGS: CTA NECK FINDINGS Aortic arch: The aortic arch appears normal. Branching pattern of the brachiocephalic vessels is normal without origin stenosis. Right carotid system:  Common carotid artery is tortuous but widely patent to the bifurcation. The carotid bifurcation does not show any plaque, stenosis or irregularity. Cervical ICA is tortuous but widely patent. Left carotid system: Left common carotid artery is widely patent to the bifurcation. No carotid bifurcation atherosclerotic disease. Cervical ICA is tortuous but widely patent. Vertebral arteries: The right vertebral artery is dominant. Both vertebral artery origins are widely patent. Both vertebral arteries appear normal through the cervical region to the foramen magnum. Skeleton: Ordinary mid cervical spondylosis. Other neck: No soft tissue mass or lymphadenopathy. Upper chest: Negative Review of the MIP images confirms the above findings CTA HEAD FINDINGS Anterior circulation: There is atherosclerotic calcification in both carotid siphon regions but no stenosis more than about 20 or 30%. The anterior and middle cerebral vessels are patent without proximal stenosis, aneurysm or vascular malformation. Distal branch vessels appear unremarkable. Posterior circulation: Both vertebral arteries are patent through the foramen magnum to the basilar. Dominant supply is from  the right. Posterior inferior cerebellar arteries are patent. The basilar artery shows mild atherosclerotic irregularity but does not have a flow limiting stenosis. Superior cerebellar and posterior cerebral arteries appear normal. Right PCA takes fetal origin. Venous sinuses: Patent and normal. Anatomic variants: None significant. Delayed phase: No abnormal enhancement. Review of the MIP images confirms the above findings IMPRESSION: Carotid bifurcations appear normal. No anterior circulation disease of significance. Tortuous vessels suggesting a history of hypertension. Mild atherosclerotic irregularity of the basilar artery but without flow limiting stenosis. Electronically Signed   By: Paulina Fusi M.D.   On: 07/10/2018 14:12   Mr Brain Wo Contrast  Result  Date: 07/10/2018 CLINICAL DATA:  Right arm and leg weakness, acute onset. Brainstem stroke. EXAM: MRI HEAD WITHOUT CONTRAST TECHNIQUE: Multiplanar, multiecho pulse sequences of the brain and surrounding structures were obtained without intravenous contrast. COMPARISON:  CT angiography same day.  CT head yesterday. FINDINGS: Brain: Diffusion imaging shows a 1 x 2 cm acute infarction in the left para median pons. No other acute infarction. No old brainstem insult is identified. There is an old right cerebellar infarction. Cerebral hemispheres show an old lacunar infarction in the right caudate head and mild small vessel change of the hemispheric white matter. No large vessel territory infarction. No mass lesion, hemorrhage, hydrocephalus or extra-axial collection. Vascular: Major vessels at the base of the brain show flow. Skull and upper cervical spine: Negative Sinuses/Orbits: Clear/normal Other: None IMPRESSION: 1 x 2 cm acute infarction in the left para median pons. No evidence of hemorrhage or mass effect. Mild chronic small-vessel change elsewhere as above. Electronically Signed   By: Paulina Fusi M.D.   On: 07/10/2018 14:24     CBC Recent Labs  Lab 07/09/18 2112 07/09/18 2130 07/10/18 0332  WBC 11.6*  --  9.8  HGB 13.5 15.6* 12.5  HCT 46.7* 46.0 42.6  PLT 219  --  174  MCV 75.6*  --  75.1*  MCH 21.8*  --  22.0*  MCHC 28.9*  --  29.3*  RDW 13.5  --  13.4  LYMPHSABS 2.8  --  2.4  MONOABS 0.8  --  0.7  EOSABS 0.3  --  0.3  BASOSABS 0.1  --  0.1    Chemistries  Recent Labs  Lab 07/09/18 2112 07/09/18 2130 07/10/18 0332  NA 140 140 142  K 3.7 3.6 3.7  CL 103 102 106  CO2 25  --  26  GLUCOSE 217* 211* 211*  BUN 9 9 10   CREATININE 0.93 0.80 0.87  CALCIUM 9.2  --  8.8*  AST 14*  --   --   ALT 18  --   --   ALKPHOS 102  --   --   BILITOT 1.2  --   --    ------------------------------------------------------------------------------------------------------------------ Recent Labs     07/10/18 0332  CHOL 209*  HDL 39*  LDLCALC 140*  TRIG 148  CHOLHDL 5.4    Lab Results  Component Value Date   HGBA1C 12.3 (H) 07/10/2018   ------------------------------------------------------------------------------------------------------------------ No results for input(s): TSH, T4TOTAL, T3FREE, THYROIDAB in the last 72 hours.  Invalid input(s): FREET3 ------------------------------------------------------------------------------------------------------------------ No results for input(s): VITAMINB12, FOLATE, FERRITIN, TIBC, IRON, RETICCTPCT in the last 72 hours.  Coagulation profile Recent Labs  Lab 07/09/18 2112  INR 0.97    No results for input(s): DDIMER in the last 72 hours.  Cardiac Enzymes No results for input(s): CKMB, TROPONINI, MYOGLOBIN in the last 168 hours.  Invalid input(s): CK ------------------------------------------------------------------------------------------------------------------  No results found for: BNP   Shon Haleourage Kadeem Hyle M.D on 07/12/2018 at 3:52 PM   Go to [email or web address] - password [email or web address] for contact info  [facility] - Office  [phone]

## 2018-07-12 NOTE — Progress Notes (Signed)
SLP Cancellation Note  Patient Details Name: Samantha GuttingHpot Rinks MRN: [id number] DOB: [birth year]   Cancelled treatment:       Reason Eval/Treat Not Completed: SLP screened, no needs identified, will sign off. Discussed condition with pt and spouse via interpreter. Pts articulation appeared precise, pt and spouse both agreed that initial dysarthria improved and speech now WNL. They denied need for f/u with SLP but are still concerned about physical needs given hemiparesis. Will sign off at this time.    Jacqueline Spofford, Riley NearingBonnie Caroline 07/12/2018, 2:07 PM

## 2018-07-13 ENCOUNTER — Encounter (HOSPITAL_COMMUNITY): Payer: Self-pay

## 2018-07-13 ENCOUNTER — Other Ambulatory Visit: Payer: Self-pay

## 2018-07-13 ENCOUNTER — Inpatient Hospital Stay (HOSPITAL_COMMUNITY)
Admission: RE | Admit: 2018-07-13 | Discharge: 2018-07-25 | DRG: 092 | Disposition: A | Discharge status: Home Health | Payer: Medicare Other | Source: Intra-hospital | Attending: Physical Medicine & Rehabilitation | Admitting: Physical Medicine & Rehabilitation

## 2018-07-13 DIAGNOSIS — M549 Dorsalgia, unspecified: Secondary | ICD-10-CM | POA: Diagnosis present

## 2018-07-13 DIAGNOSIS — I10 Essential (primary) hypertension: Secondary | ICD-10-CM

## 2018-07-13 DIAGNOSIS — D72829 Elevated white blood cell count, unspecified: Secondary | ICD-10-CM | POA: Diagnosis present

## 2018-07-13 DIAGNOSIS — R7309 Other abnormal glucose: Secondary | ICD-10-CM

## 2018-07-13 DIAGNOSIS — Z87891 Personal history of nicotine dependence: Secondary | ICD-10-CM | POA: Diagnosis not present

## 2018-07-13 DIAGNOSIS — R197 Diarrhea, unspecified: Secondary | ICD-10-CM | POA: Diagnosis present

## 2018-07-13 DIAGNOSIS — R2689 Other abnormalities of gait and mobility: Principal | ICD-10-CM | POA: Diagnosis present

## 2018-07-13 DIAGNOSIS — I69351 Hemiplegia and hemiparesis following cerebral infarction affecting right dominant side: Secondary | ICD-10-CM | POA: Diagnosis not present

## 2018-07-13 DIAGNOSIS — R195 Other fecal abnormalities: Secondary | ICD-10-CM | POA: Diagnosis not present

## 2018-07-13 DIAGNOSIS — E119 Type 2 diabetes mellitus without complications: Secondary | ICD-10-CM | POA: Diagnosis present

## 2018-07-13 DIAGNOSIS — I639 Cerebral infarction, unspecified: Secondary | ICD-10-CM | POA: Diagnosis present

## 2018-07-13 DIAGNOSIS — D62 Acute posthemorrhagic anemia: Secondary | ICD-10-CM

## 2018-07-13 DIAGNOSIS — I6350 Cerebral infarction due to unspecified occlusion or stenosis of unspecified cerebral artery: Secondary | ICD-10-CM | POA: Diagnosis present

## 2018-07-13 LAB — GLUCOSE, CAPILLARY
GLUCOSE-CAPILLARY: 106 mg/dL — AB (ref 70–99)
GLUCOSE-CAPILLARY: 308 mg/dL — AB (ref 70–99)
Glucose-Capillary: 157 mg/dL — ABNORMAL HIGH (ref 70–99)
Glucose-Capillary: 94 mg/dL (ref 70–99)

## 2018-07-13 MED ORDER — BISACODYL 10 MG RE SUPP: 10.0000 mg | Freq: Every day | RECTAL | Status: DC | PRN

## 2018-07-13 MED ORDER — METFORMIN HCL 850 MG PO TABS
850.0000 mg | ORAL_TABLET | Freq: Two times a day (BID) | ORAL | Status: DC
  Administered 2018-07-13: 850 mg via ORAL
  Filled 2018-07-13 (×2): qty 1

## 2018-07-13 MED ORDER — GUAIFENESIN-DM 100-10 MG/5ML PO SYRP: 5.0000 mL | ORAL_SOLUTION | Freq: Four times a day (QID) | ORAL | Status: DC | PRN

## 2018-07-13 MED ORDER — ENOXAPARIN SODIUM 40 MG/0.4ML ~~LOC~~ SOLN: 40.0000 mg | SUBCUTANEOUS | 0 refills | Status: DC

## 2018-07-13 MED ORDER — AMLODIPINE BESYLATE 10 MG PO TABS: 10.0000 mg | ORAL_TABLET | Freq: Every day | ORAL | 2 refills | Status: DC

## 2018-07-13 MED ORDER — PROCHLORPERAZINE 25 MG RE SUPP: 12.5000 mg | Freq: Four times a day (QID) | RECTAL | Status: DC | PRN

## 2018-07-13 MED ORDER — CLOPIDOGREL BISULFATE 75 MG PO TABS
75.0000 mg | ORAL_TABLET | Freq: Every day | ORAL | Status: DC
  Administered 2018-07-14 – 2018-07-25 (×12): 75 mg via ORAL
  Filled 2018-07-13 (×12): qty 1

## 2018-07-13 MED ORDER — ACETAMINOPHEN 325 MG PO TABS
325.0000 mg | ORAL_TABLET | ORAL | Status: DC | PRN
  Administered 2018-07-21: 650 mg via ORAL
  Filled 2018-07-13: qty 2

## 2018-07-13 MED ORDER — CLOPIDOGREL BISULFATE 75 MG PO TABS: 75.0000 mg | ORAL_TABLET | Freq: Every day | ORAL | 2 refills | Status: DC

## 2018-07-13 MED ORDER — METFORMIN HCL 850 MG PO TABS: 850.0000 mg | ORAL_TABLET | Freq: Two times a day (BID) | ORAL | 2 refills | Status: DC

## 2018-07-13 MED ORDER — ACETAMINOPHEN 325 MG PO TABS: 650.0000 mg | ORAL_TABLET | ORAL | 1 refills | Status: DC | PRN

## 2018-07-13 MED ORDER — FLEET ENEMA 7-19 GM/118ML RE ENEM: 1.0000 | ENEMA | Freq: Once | RECTAL | Status: DC | PRN

## 2018-07-13 MED ORDER — AMLODIPINE BESYLATE 10 MG PO TABS
10.0000 mg | ORAL_TABLET | Freq: Every day | ORAL | Status: DC
  Administered 2018-07-14 – 2018-07-25 (×12): 10 mg via ORAL
  Filled 2018-07-13 (×12): qty 1

## 2018-07-13 MED ORDER — METOPROLOL TARTRATE 25 MG PO TABS
25.0000 mg | ORAL_TABLET | Freq: Two times a day (BID) | ORAL | Status: DC
  Administered 2018-07-13 – 2018-07-25 (×24): 25 mg via ORAL
  Filled 2018-07-13 (×24): qty 1

## 2018-07-13 MED ORDER — DIPHENHYDRAMINE HCL 12.5 MG/5ML PO ELIX: 12.5000 mg | ORAL_SOLUTION | Freq: Four times a day (QID) | ORAL | Status: DC | PRN

## 2018-07-13 MED ORDER — LOSARTAN POTASSIUM 50 MG PO TABS: 50.0000 mg | ORAL_TABLET | Freq: Every day | ORAL | 3 refills | Status: DC

## 2018-07-13 MED ORDER — INSULIN ASPART 100 UNIT/ML ~~LOC~~ SOLN
0.0000 [IU] | Freq: Three times a day (TID) | SUBCUTANEOUS | Status: DC
  Administered 2018-07-13: 7 [IU] via SUBCUTANEOUS
  Administered 2018-07-14: 1 [IU] via SUBCUTANEOUS
  Administered 2018-07-14 (×2): 2 [IU] via SUBCUTANEOUS
  Administered 2018-07-15: 3 [IU] via SUBCUTANEOUS
  Administered 2018-07-15 – 2018-07-16 (×3): 2 [IU] via SUBCUTANEOUS
  Administered 2018-07-16: 1 [IU] via SUBCUTANEOUS
  Administered 2018-07-17 – 2018-07-19 (×4): 2 [IU] via SUBCUTANEOUS
  Administered 2018-07-19: 3 [IU] via SUBCUTANEOUS
  Administered 2018-07-19 – 2018-07-20 (×2): 2 [IU] via SUBCUTANEOUS
  Administered 2018-07-20: 5 [IU] via SUBCUTANEOUS
  Administered 2018-07-21: 1 [IU] via SUBCUTANEOUS
  Administered 2018-07-21: 3 [IU] via SUBCUTANEOUS
  Administered 2018-07-21 – 2018-07-25 (×9): 1 [IU] via SUBCUTANEOUS

## 2018-07-13 MED ORDER — INSULIN GLARGINE 100 UNIT/ML ~~LOC~~ SOLN: 7.0000 [IU] | Freq: Every day | SUBCUTANEOUS | 11 refills | Status: DC

## 2018-07-13 MED ORDER — ALUM & MAG HYDROXIDE-SIMETH 200-200-20 MG/5ML PO SUSP: 30.0000 mL | ORAL | Status: DC | PRN

## 2018-07-13 MED ORDER — ENOXAPARIN SODIUM 40 MG/0.4ML ~~LOC~~ SOLN
40.0000 mg | SUBCUTANEOUS | Status: DC
  Administered 2018-07-14 – 2018-07-25 (×12): 40 mg via SUBCUTANEOUS
  Filled 2018-07-13 (×12): qty 0.4

## 2018-07-13 MED ORDER — POLYETHYLENE GLYCOL 3350 17 G PO PACK
17.0000 g | PACK | Freq: Every day | ORAL | Status: DC | PRN
  Administered 2018-07-20: 17 g via ORAL
  Filled 2018-07-13: qty 1

## 2018-07-13 MED ORDER — SENNOSIDES-DOCUSATE SODIUM 8.6-50 MG PO TABS
2.0000 | ORAL_TABLET | Freq: Every day | ORAL | Status: DC
  Administered 2018-07-13 – 2018-07-24 (×11): 2 via ORAL
  Filled 2018-07-13 (×10): qty 2

## 2018-07-13 MED ORDER — PROCHLORPERAZINE MALEATE 5 MG PO TABS: 5.0000 mg | ORAL_TABLET | Freq: Four times a day (QID) | ORAL | Status: DC | PRN

## 2018-07-13 MED ORDER — TRAZODONE HCL 50 MG PO TABS: 25.0000 mg | ORAL_TABLET | Freq: Every evening | ORAL | Status: DC | PRN

## 2018-07-13 MED ORDER — PROCHLORPERAZINE EDISYLATE 10 MG/2ML IJ SOLN: 5.0000 mg | Freq: Four times a day (QID) | INTRAMUSCULAR | Status: DC | PRN

## 2018-07-13 MED ORDER — ATORVASTATIN CALCIUM 40 MG PO TABS
40.0000 mg | ORAL_TABLET | Freq: Every day | ORAL | Status: DC
  Administered 2018-07-13 – 2018-07-24 (×12): 40 mg via ORAL
  Filled 2018-07-13 (×12): qty 1

## 2018-07-13 MED ORDER — ASPIRIN EC 81 MG PO TBEC
81.0000 mg | DELAYED_RELEASE_TABLET | Freq: Every day | ORAL | Status: DC
  Administered 2018-07-14 – 2018-07-25 (×12): 81 mg via ORAL
  Filled 2018-07-13 (×12): qty 1

## 2018-07-13 MED ORDER — INSULIN GLARGINE 100 UNIT/ML ~~LOC~~ SOLN: 7.0000 [IU] | Freq: Every day | SUBCUTANEOUS | Status: DC

## 2018-07-13 MED ORDER — METOPROLOL TARTRATE 25 MG PO TABS: 25.0000 mg | ORAL_TABLET | Freq: Two times a day (BID) | ORAL | 2 refills | Status: DC

## 2018-07-13 MED ORDER — SENNOSIDES-DOCUSATE SODIUM 8.6-50 MG PO TABS: 2.0000 | ORAL_TABLET | Freq: Every day | ORAL | 2 refills | Status: DC

## 2018-07-13 MED ORDER — INSULIN ASPART 100 UNIT/ML ~~LOC~~ SOLN: 0.0000 [IU] | Freq: Every day | SUBCUTANEOUS | 11 refills | Status: DC

## 2018-07-13 MED ORDER — METOPROLOL TARTRATE 25 MG PO TABS
25.0000 mg | ORAL_TABLET | Freq: Two times a day (BID) | ORAL | Status: DC
  Administered 2018-07-13: 25 mg via ORAL
  Filled 2018-07-13: qty 1

## 2018-07-13 MED ORDER — INSULIN ASPART 100 UNIT/ML ~~LOC~~ SOLN
0.0000 [IU] | Freq: Every day | SUBCUTANEOUS | Status: DC
  Administered 2018-07-18 – 2018-07-24 (×2): 2 [IU] via SUBCUTANEOUS

## 2018-07-13 MED ORDER — LOSARTAN POTASSIUM 50 MG PO TABS
50.0000 mg | ORAL_TABLET | Freq: Every day | ORAL | Status: DC
  Administered 2018-07-14 – 2018-07-25 (×12): 50 mg via ORAL
  Filled 2018-07-13 (×12): qty 1

## 2018-07-13 MED ORDER — ASPIRIN 81 MG PO TBEC: 81.0000 mg | DELAYED_RELEASE_TABLET | Freq: Every day | ORAL | 1 refills | Status: DC

## 2018-07-13 MED ORDER — INSULIN ASPART 100 UNIT/ML ~~LOC~~ SOLN: 0.0000 [IU] | Freq: Three times a day (TID) | SUBCUTANEOUS | 11 refills | Status: DC

## 2018-07-13 MED ORDER — ATORVASTATIN CALCIUM 40 MG PO TABS: 40.0000 mg | ORAL_TABLET | Freq: Every day | ORAL | 2 refills | Status: DC

## 2018-07-13 NOTE — Progress Notes (Signed)
Pt admitted to 4W25. Pt alert and oriented. Husband at bedside. RN attempted to use telephonic interpreter services and there was no interpreter available at this time. Husband speaks very limited english but was able to translate enough to orient patient to unit and call bell. Pt's lunch ordered and patient resting comfortably in bed. Continue plan of care.

## 2018-07-13 NOTE — Progress Notes (Signed)
Orthopedic Tech Progress Note Patient Details:  Samantha GuttingHpot Paul 1946-07-23 [id number]  Patient ID: Samantha Paul, female   DOB: 1946-07-23, 72 y.o.   MRN: [id number]   Saul FordyceJennifer C [name] 07/13/2018, 2:37 PMCalled [facility] for right resting hand splint.

## 2018-07-13 NOTE — H&P (Signed)
Physical Medicine and Rehabilitation Admission H&P      Chief Complaint  Patient presents with  . Stroke Symptoms   ZOX:WRUE Samantha Paul is a 72 year old female with history of untreated HTN who was admitted on07/15/2019 with 2-day history of right-sided weakness, slurred speech and difficulty walking. Blood pressure at admission was 193/89 and MRI brain done revealing 1 x 2 cm acute left paramedian pontine infarct. CT head neck was negative for flow-limiting stenosis. 2D echo showed EF of 65 to 70% with moderate focal basal hypertrophy and moderate proximal septal thickening with findings consistent suggestive of HOCM. Dr. Raynald Kemp felt that stroke was due to small vessel disease and recommended aggressive stroke risk factors management. She is to continue DAPT x3 weeks followed by aspirin or Plavix alone. Patient with new diagnosis of diabetes with hemoglobin A1c of 12.3 and she was started on Lantus insulin. Currently patient continues to be limited by right-sided weakness with balance deficits. CIR was recommended due to functional deficits.   Review of Systems  Constitutional: Negative forchillsand fever.  HENT: Negative forhearing lossand tinnitus.  Eyes: Negative forblurred visionand double vision.  Respiratory: Negative forshortness of breathand wheezing.  Cardiovascular: Negative forchest pain,palpitationsand leg swelling.  Gastrointestinal: Positive forheartburnand nausea. Negative forabdominal painand constipation.  Genitourinary: Negative fordysuriaand urgency.  Musculoskeletal: Positive forback pain(low back pain)and joint pain(right knee pain).  Skin: Negative forrash.  Neurological: Positive forfocal weakness. Negative fordizzinessand headaches.  Psychiatric/Behavioral: The patientis nervous/anxiousand has insomnia(due to right sided weakness).       Past Medical History:  Diagnosis Date  . Back pain   . HTN  (hypertension)   . Knee pain     History reviewed. No pertinent surgical history.        Family History  Problem Relation Age of Onset  . Arthritis Brother     Social History:Married. Independent without AD PTA. Lives with son and husband. She stopped smoking and using alcohol in mid 90's. She does not use illicit drugs.   Allergies:No Known Allergies   No medications prior to admission.    Drug Regimen Review Drug regimen was reviewed and remains appropriate with no significant issues identified  Home: Home Living Family/patient expects to be discharged to:: Private residence Living Arrangements: Spouse/significant other Available Help at Discharge: Family, Available 24 hours/day Type of Home: House Home Access: Stairs to enter Secretary/administrator of Steps: 2 Home Layout: One level Bathroom Shower/Tub: Engineer, manufacturing systems: Standard Home Equipment: None  Functional History: Prior Function Level of Independence: Independent  Functional Status: Mobility: Bed Mobility Overal bed mobility: Needs Assistance Bed Mobility: Supine to Sit, Sit to Supine Supine to sit: Min assist(assist with pad to bring hips EOB and trunk elevation) Sit to supine: Min assist(assist for RLE back into bed) General bed mobility comments: sitting EOB upon my entry Transfers Overall transfer level: Needs assistance Equipment used: 1 person hand held assist Transfers: Sit to/from Stand Sit to Stand: Min assist General transfer comment: pt up from EOB assist for balance Ambulation/Gait Ambulation/Gait assistance: Mod assist Gait Distance (Feet): 120 Feet Assistive device: 1 person hand held assist, Quad cane Gait Pattern/deviations: Step-to pattern, Step-through pattern, Decreased stance time - right, Drifts right/left General Gait Details: cues for placement of cane, assist for R UE about 80% of the walk, at times held it at her side with  some evidence of increasing tone, facilitation at ribs for L weight shift to ensure clearance of R foot Gait velocity interpretation: <  1.8 ft/sec, indicate of risk for recurrent falls  ADL: ADL Overall ADL's : Needs assistance/impaired Eating/Feeding: Minimal assistance, Sitting Eating/Feeding Details (indicate cue type and reason): Pt reporting she has been self feeding with L hand Grooming: Moderate assistance, Sitting, Wash/dry hands Upper Body Bathing: Moderate assistance, Sitting Lower Body Bathing: Maximal assistance, Sit to/from stand Upper Body Dressing : Moderate assistance, Sitting Lower Body Dressing: Maximal assistance, Sit to/from stand Toilet Transfer: Moderate assistance, Ambulation Toilet Transfer Details (indicate cue type and reason): HHA, simulated by functional mobility in room Functional mobility during ADLs: Moderate assistance(HHA)  Cognition: Cognition Overall Cognitive Status: Within Functional Limits for tasks assessed Orientation Level: Oriented X4 Cognition Arousal/Alertness: Awake/alert Behavior During Therapy: WFL for tasks assessed/performed Overall Cognitive Status: Within Functional Limits for tasks assessed Area of Impairment: Orientation, Safety/judgement Orientation Level: Disoriented to, Person Safety/Judgement: Decreased awareness of deficits, Decreased awareness of safety General Comments: Pt appears to follow commands appropriately Difficult to assess due to: Non-English speaking   Blood pressure (!) 158/75, pulse 72, temperature 98.3 F (36.8 C), temperature source Oral, resp. rate 20, height 4\' 10"  (1.473 m), weight 58.9 kg (129 lb 13.6 oz), SpO2 99 %. Physical Exam Nursing noteand vitalsreviewed. Constitutional: She isoriented to person, place, and time. She appearswell-developedand well-nourished.  HENT:  Head:Normocephalicand atraumatic.  Eyes:Pupils are equal, round, and reactive to light.EOMare normal.  Neck:Normal  range of motion.  Cardiovascular:Normal rateand regular rhythm. Exam revealsno friction rub. No murmurheard. Respiratory:Effort normaland breath sounds normal.  ZO:XWRU. She exhibitsno distension. There isno tenderness.  Musculoskeletal:Normal range of motion.  Neurological: She isalertand oriented to person, place, and time. Right central 7. Follows simple commands. RUE 1/5 deltoid, 2/5 biceps and triceps and 4/5 wrist and HI. RLE: 3-4/5 HF, KE and 3/5 ADF/PF. Skin: Skin iswarmand dry.  Psychiatric: anxious   LabResultsLast48Hours       Results for orders placed or performed during the hospital encounter of 07/09/18 (from the past 48 hour(s))  Glucose, capillary Status: Abnormal   Collection Time: 07/11/18 4:45 PM  Result Value Ref Range   Glucose-Capillary 263 (H) 70 - 99 mg/dL  Glucose, capillary Status: Abnormal   Collection Time: 07/11/18 9:42 PM  Result Value Ref Range   Glucose-Capillary 206 (H) 70 - 99 mg/dL  Glucose, capillary Status: Abnormal   Collection Time: 07/12/18 8:08 AM  Result Value Ref Range   Glucose-Capillary 129 (H) 70 - 99 mg/dL  Glucose, capillary Status: Abnormal   Collection Time: 07/12/18 11:56 AM  Result Value Ref Range   Glucose-Capillary 189 (H) 70 - 99 mg/dL  Glucose, capillary Status: Abnormal   Collection Time: 07/12/18 4:37 PM  Result Value Ref Range   Glucose-Capillary 255 (H) 70 - 99 mg/dL  Glucose, capillary Status: Abnormal   Collection Time: 07/12/18 9:21 PM  Result Value Ref Range   Glucose-Capillary 197 (H) 70 - 99 mg/dL  Glucose, capillary Status: Abnormal   Collection Time: 07/13/18 7:58 AM  Result Value Ref Range   Glucose-Capillary 157 (H) 70 - 99 mg/dL     [id number])  No results found.    Medical Problem list and Plan: 1. Functional deficits secondary to left pontine infarct  -admit to inpatient rehab  -pt needs a  translator for communication 2. DVT proph with sq lovenox 40mg  daily 3. HTN: monitor with increased activity  -norvasc, metoprolol, lopressor.   -avoid over-treatment 4. Pain mgt: tylenol prn 5. Mood: team to provide ego support as necessary 6. Neuropsych: pt is competent to make decisions on  her own behalf 7. Diabetes type 2: SSI for covg, TID and HS.   -metformin 850mg  bid  -follow for pattern.  8. Stroke prophylaxis with ASA and plavix    Post Admission Physician Evaluation: 1. Functional deficits secondary  to left pontine infarct. 2. Patient is admitted to receive collaborative, interdisciplinary care between the physiatrist, rehab nursing staff, and therapy team. 3. Patient's level of medical complexity and substantial therapy needs in context of that medical necessity cannot be provided at a lesser intensity of care such as a SNF. 4. Patient has experienced substantial functional loss from his/her baseline which was documented above under the "Functional History" and "Functional Status" headings.  Judging by the patient's diagnosis, physical exam, and functional history, the patient has potential for functional progress which will result in measurable gains while on inpatient rehab.  These gains will be of substantial and practical use upon discharge  in facilitating mobility and self-care at the household level. 5. Physiatrist will provide 24 hour management of medical needs as well as oversight of the therapy plan/treatment and provide guidance as appropriate regarding the interaction of the two. 6. The Preadmission Screening has been reviewed and patient status is unchanged unless otherwise stated above. 7. 24 hour rehab nursing will assist with bladder management, bowel management, safety, skin/wound care, disease management, medication administration and patient education  and help integrate therapy concepts, techniques,education, etc. 8. PT will assess and treat for/with: Lower  extremity strength, range of motion, stamina, balance, functional mobility, safety, adaptive techniques and equipment, NMR, family education.   Goals are: mod I. 9. OT will assess and treat for/with: ADL's, functional mobility, safety, upper extremity strength, adaptive techniques and equipment, NMR, family education, community reentry.   Goals are: mod I to supervision. Therapy may proceed with showering this patient. 10. SLP will assess and treat for/with: n/a.  Goals are: n/a. 11. Case Management and Social Worker will assess and treat for psychological issues and discharge planning. 12. Team conference will be held weekly to assess progress toward goals and to determine barriers to discharge. 13. Patient will receive at least 3 hours of therapy per day at least 5 days per week. 14. ELOS: 7-12 days       15. Prognosis:  excellent      Ranelle OysterZachary T Swartz, MD 07/13/2018            Jacquelynn CreePamela S Love, PA-C 07/13/2018

## 2018-07-13 NOTE — H&P (Signed)
Physical Medicine and Rehabilitation Admission H&P    Chief Complaint  Patient presents with  . Stroke Symptoms   HPI: Samantha Paul is a 72 year old female with history of untreated HTN who was admitted on 07/09/2018 with 2-day history of right-sided weakness, slurred speech and difficulty walking.  Blood pressure at admission was 193/89 and MRI brain done revealing 1 x 2 cm acute left paramedian pontine infarct.  CT head neck was negative for flow-limiting stenosis.  2D echo showed EF of 65 to 70% with moderate focal basal hypertrophy and moderate proximal septal thickening with findings consistent suggestive of HOCM.  Dr. Raynald Kemp felt that stroke was due to small vessel disease and recommended aggressive stroke risk factors management.  She is to continue DAPT x3 weeks followed by aspirin or Plavix alone.  Patient with new diagnosis of diabetes with hemoglobin A1c of 12.3 and she was started on Lantus insulin.  Currently patient continues to be limited by right-sided weakness with balance deficits.  CIR was recommended due to functional deficits.   Review of Systems  Constitutional: Negative for chills and fever.  HENT: Negative for hearing loss and tinnitus.   Eyes: Negative for blurred vision and double vision.  Respiratory: Negative for shortness of breath and wheezing.   Cardiovascular: Negative for chest pain, palpitations and leg swelling.  Gastrointestinal: Positive for heartburn and nausea. Negative for abdominal pain and constipation.  Genitourinary: Negative for dysuria and urgency.  Musculoskeletal: Positive for back pain (low back pain) and joint pain (right knee pain).  Skin: Negative for rash.  Neurological: Positive for focal weakness. Negative for dizziness and headaches.  Psychiatric/Behavioral: The patient is nervous/anxious and has insomnia (due to right sided weakness).     Past Medical History:  Diagnosis Date  . Back pain   . HTN (hypertension)   . Knee pain      History reviewed. No pertinent surgical history.    Family History  Problem Relation Age of Onset  . Arthritis Brother     Social History:  Married. Independent without AD PTA. Lives with son and husband. She stopped smoking and using alcohol in mid 90's. She does not use illicit drugs.     Allergies: No Known Allergies    No medications prior to admission.    Drug Regimen Review  Drug regimen was reviewed and remains appropriate with no significant issues identified  Home: Home Living Family/patient expects to be discharged to:: Private residence Living Arrangements: Spouse/significant other Available Help at Discharge: Family, Available 24 hours/day Type of Home: House Home Access: Stairs to enter Secretary/administrator of Steps: 2 Home Layout: One level Bathroom Shower/Tub: Engineer, manufacturing systems: Standard Home Equipment: None   Functional History: Prior Function Level of Independence: Independent  Functional Status:  Mobility: Bed Mobility Overal bed mobility: Needs Assistance Bed Mobility: Supine to Sit, Sit to Supine Supine to sit: Min assist(assist with pad to bring hips EOB and trunk elevation) Sit to supine: Min assist(assist for RLE back into bed) General bed mobility comments: sitting EOB upon my entry Transfers Overall transfer level: Needs assistance Equipment used: 1 person hand held assist Transfers: Sit to/from Stand Sit to Stand: Min assist General transfer comment: pt up from EOB assist for balance Ambulation/Gait Ambulation/Gait assistance: Mod assist Gait Distance (Feet): 120 Feet Assistive device: 1 person hand held assist, Quad cane Gait Pattern/deviations: Step-to pattern, Step-through pattern, Decreased stance time - right, Drifts right/left General Gait Details: cues for placement of cane,  assist for R UE about 80% of the walk, at times held it at her side with some evidence of increasing tone, facilitation at ribs for L  weight shift to ensure clearance of R foot Gait velocity interpretation: <1.8 ft/sec, indicate of risk for recurrent falls    ADL: ADL Overall ADL's : Needs assistance/impaired Eating/Feeding: Minimal assistance, Sitting Eating/Feeding Details (indicate cue type and reason): Pt reporting she has been self feeding with L hand Grooming: Moderate assistance, Sitting, Wash/dry hands Upper Body Bathing: Moderate assistance, Sitting Lower Body Bathing: Maximal assistance, Sit to/from stand Upper Body Dressing : Moderate assistance, Sitting Lower Body Dressing: Maximal assistance, Sit to/from stand Toilet Transfer: Moderate assistance, Ambulation Toilet Transfer Details (indicate cue type and reason): HHA, simulated by functional mobility in room Functional mobility during ADLs: Moderate assistance(HHA)  Cognition: Cognition Overall Cognitive Status: Within Functional Limits for tasks assessed Orientation Level: Oriented X4 Cognition Arousal/Alertness: Awake/alert Behavior During Therapy: WFL for tasks assessed/performed Overall Cognitive Status: Within Functional Limits for tasks assessed Area of Impairment: Orientation, Safety/judgement Orientation Level: Disoriented to, Person Safety/Judgement: Decreased awareness of deficits, Decreased awareness of safety General Comments: Pt appears to follow commands appropriately Difficult to assess due to: Non-English speaking   Blood pressure (!) 158/75, pulse 72, temperature 98.3 F (36.8 C), temperature source Oral, resp. rate 20, height 4\' 10"  (1.473 m), weight 58.9 kg (129 lb 13.6 oz), SpO2 99 %. Physical Exam  Nursing note and vitals reviewed. Constitutional: She is oriented to person, place, and time. She appears well-developed and well-nourished.  HENT:  Head: Normocephalic and atraumatic.  Eyes: Pupils are equal, round, and reactive to light. EOM are normal.  Neck: Normal range of motion.  Cardiovascular: Normal rate and regular  rhythm. Exam reveals no friction rub.  No murmur heard. Respiratory: Effort normal and breath sounds normal.  GI: Soft. She exhibits no distension. There is no tenderness.  Musculoskeletal: Normal range of motion.  Neurological: She is alert and oriented to person, place, and time.  Right central 7. Follows simple commands. RUE 1/5 deltoid, 2/5 biceps and triceps and 4/5 wrist and HI. RLE: 3-4/5 HF, KE and 3/5 ADF/PF.   Skin: Skin is warm and dry.  Psychiatric:  anxious    Results for orders placed or performed during the hospital encounter of 07/09/18 (from the past 48 hour(s))  Glucose, capillary     Status: Abnormal   Collection Time: 07/11/18  4:45 PM  Result Value Ref Range   Glucose-Capillary 263 (H) 70 - 99 mg/dL  Glucose, capillary     Status: Abnormal   Collection Time: 07/11/18  9:42 PM  Result Value Ref Range   Glucose-Capillary 206 (H) 70 - 99 mg/dL  Glucose, capillary     Status: Abnormal   Collection Time: 07/12/18  8:08 AM  Result Value Ref Range   Glucose-Capillary 129 (H) 70 - 99 mg/dL  Glucose, capillary     Status: Abnormal   Collection Time: 07/12/18 11:56 AM  Result Value Ref Range   Glucose-Capillary 189 (H) 70 - 99 mg/dL  Glucose, capillary     Status: Abnormal   Collection Time: 07/12/18  4:37 PM  Result Value Ref Range   Glucose-Capillary 255 (H) 70 - 99 mg/dL  Glucose, capillary     Status: Abnormal   Collection Time: 07/12/18  9:21 PM  Result Value Ref Range   Glucose-Capillary 197 (H) 70 - 99 mg/dL  Glucose, capillary     Status: Abnormal   Collection Time: 07/13/18  7:58 AM  Result Value Ref Range   Glucose-Capillary 157 (H) 70 - 99 mg/dL   No results found.      Jacquelynn Cree, PA-C 07/13/2018

## 2018-07-13 NOTE — Progress Notes (Signed)
Inpatient Rehabilitation-Admissions Coordinator   [facility]. Floor RN, SW and CM notified of plan. Please call if questions.   Nanine MeansKelly Amarissa Koerner, OTR/L  Rehab Admissions Coordinator  [phone] 07/13/2018 11:38 AM

## 2018-07-13 NOTE — Care Management Note (Signed)
Case Management Note  Patient Details  Name: Samantha Paul MRN: [id number] Date of Birth: [birth year]  Subjective/Objective:    72 yo vi[place] female admitted with right weakness with CT demonstrating age indeterminate left pons infarct.  PTA, pt independent, lives with spouse.                  Action/Plan: Family able to provide assistance at dc.  PT/OT recommending CIR prior to dc home.   Expected Discharge Date:  07/13/18               Expected Discharge Plan:  IP Rehab Facility  In-House Referral:     Discharge planning Services  CM Consult  Post Acute Care Choice:    Choice offered to:     DME Arranged:    DME Agency:     HH Arranged:    HH Agency:     Status of Service:  Completed, signed off  If discussed at [employer] Meetings, dates discussed:    Additional Comments:  07/13/18 J. Cato Liburd, Samantha Paul, Samantha Paul Pt medically stable for discharge today, and insurance authorization received for inpatient rehab stay.  Plan dc to [facility].  Samantha Paul, Samantha Roseman Paul, Samantha Paul 07/13/2018, 2:36 PM

## 2018-07-13 NOTE — Discharge Summary (Signed)
Samantha Paul, is a 72 y.o. female  DOB [birth year]  MRN [id number].  Admission date:  07/09/2018  Admitting Physician  Clydie Braun, MD  Discharge Date:  07/13/2018   Primary MD  Patient, No Pcp Per  Recommendations for primary care physician for things to follow:   1)You are taking aspirin and Plavix due to acute stroke so Avoid ibuprofen/Advil/Aleve/Motrin/Goody Powders/Naproxen/BC powders/Meloxicam/Diclofenac/Indomethacin and other Nonsteroidal anti-inflammatory medications as these will make you more likely to bleed and can cause stomach ulcers, can also cause Kidney problems.   2)You have Diabetes and will need to be on medications post discharge for diabetes/blood sugar problems in order to reduce your risk for another stroke  3) you have a blood pressure and you need to be on medications for blood pressure problems before discharge- in order to reduce your risk for another stroke  4) you need to take cholesterol medicine for the rest of your life to reduce your risk for another stroke   Admission Diagnosis  CVA (cerebral vascular accident) (HCC) [I63.9] Cerebral infarction, unspecified mechanism (HCC) [I63.9]   Discharge Diagnosis  CVA (cerebral vascular accident) (HCC) [I63.9] Cerebral infarction, unspecified mechanism (HCC) [I63.9]    Active Problems:   CVA (cerebral vascular accident) (HCC)   Hypertensive urgency   Hyperglycemia   Right sided weakness   Hyperlipidemia      Past Medical History:  Diagnosis Date  . Back pain   . HTN (hypertension)   . Knee pain     History reviewed. No pertinent surgical history.     HPI  from the history and physical done on the day of admission:   HPI: Samantha Paul is a 72 y.o. [place] speaking female without significant past medical history; who presents with reports of 2 days of right-sided weakness.  History is limited due to language barrier and  interpreter services unavailable at this time.  Patient's granddaughter tries to help translate.  Patient has been unable to move her right arm,  has had right leg weakness, unable to walk without assistance, and slurred speech. Other associated symptoms include some joint pains. Patient denies any other complaints.  She does not regularly see a doctor and notes no significant family history of issues reported that her parents died of old age.  ED Course: Upon admission to the emergency department patient was seen to have blood pressure as high as 193/89.  CT scan of the brain revealed an age-indeterminate left pontine infarct.  Labs revealed WBC 11.6 and glucose 217.  Patient was out of the window for TPA.   Hospital Course:    Brief summary  72 year old [place] female admitted on 07/10/2018 found to have acute CVA, prior to admission patient had no significant past medical history that she knew of however during this admission she has been found to have diabetes and hypertension  Plan:- 1)Acute stroke--- left pontine infarct likely secondary to small vessel disease source, overall right-sided weakness is improving,  right upper extremity weakness persist, neurology input appreciated, continue Plavix  and aspirin for 3 weeks (thru 08/01/18) and then Plavix alone after that, continue Lipitor 40 mg daily,  transfer to inpatient rehab  2)HTN-improved BP control, patient with frequent PVCs, metoprolol 25 mg twice daily added , continue amlodipine 10 mg daily and losartan 50 mg daily,,    3)DM2-- New diagnosis, A1c above 12, given how high patient's A1c , it is unlikely that oral agents alone with suffice to achieve an A1c below 7, c/n  metformin 850 mg twice daily, diabetic education data, c/n diabetic diet, c/n Lantus insulin 7 units nightly, Use Novolog/Humalog Sliding scale insulin with Accu-Cheks/Fingersticks as ordered With proper translation patient and family can be educated on appropriate  use of insulin prior to discharge from rehab.  Patient is insulin nave so please titrate insulin doses up slowly.    Code Status : full    Disposition Plan  : CIR   Consults  : Physical therapy, inpatient rehab consult, neurology consult   DVT Prophylaxis  :  Lovenox  until patient is more ambulatory  Discharge Condition: stable  Follow UP--outpatient follow-up with neurology  Follow-up Information    [facility]. Schedule an appointment as soon as possible for a visit in 4 week(s).   Specialty:  Radiology Contact information: [address] [place] [address] [phone]          Diet and Activity recommendation:  As advised  Discharge Instructions    Discharge Instructions    Ambulatory referral to Neurology   Complete by:  As directed    Follow up with stroke clinic NP (Jessica Vanschaick or Darrol Angel, if both not available, consider Manson Allan, or Ahern) at [facility] in about 4 weeks. Thanks.   Call MD for:  difficulty breathing, headache or visual disturbances   Complete by:  As directed    Call MD for:  persistant dizziness or light-headedness   Complete by:  As directed    Call MD for:  persistant nausea and vomiting   Complete by:  As directed    Call MD for:  severe uncontrolled pain   Complete by:  As directed    Call MD for:  temperature >100.4   Complete by:  As directed    Diet - low sodium heart healthy   Complete by:  As directed    Diet Carb Modified   Complete by:  As directed    Discharge instructions   Complete by:  As directed    1)You are taking aspirin and Plavix due to acute stroke so Avoid ibuprofen/Advil/Aleve/Motrin/Goody Powders/Naproxen/BC powders/Meloxicam/Diclofenac/Indomethacin and other Nonsteroidal anti-inflammatory medications as these will make you more likely to bleed and can cause stomach ulcers, can also cause Kidney problems.   2)You have Diabetes and will  need to be on medications post discharge for diabetes/blood sugar problems in order to reduce your risk for another stroke  3) you have a blood pressure and you need to be on medications for blood pressure problems before discharge- in order to reduce your risk for another stroke  4) you need to take cholesterol medicine for the rest of your life to reduce your risk for another stroke   Increase activity slowly   Complete by:  As directed    Activity limitations as per physical and occupational therapist  Fall precautions due to right-sided weakness        Discharge Medications     Allergies as of 07/13/2018   No Known Allergies  Medication List    TAKE these medications   acetaminophen 325 MG tablet Commonly known as:  TYLENOL Take 2 tablets (650 mg total) by mouth every 4 (four) hours as needed for mild pain (or temp > 37.5 C (99.5 F)).   amLODipine 10 MG tablet Commonly known as:  NORVASC Take 1 tablet (10 mg total) by mouth daily. Start taking on:  07/14/2018   aspirin 81 MG EC tablet Take 1 tablet (81 mg total) by mouth daily. With food Start taking on:  07/14/2018   atorvastatin 40 MG tablet Commonly known as:  LIPITOR Take 1 tablet (40 mg total) by mouth daily at 6 PM.   clopidogrel 75 MG tablet Commonly known as:  PLAVIX Take 1 tablet (75 mg total) by mouth daily. Start taking on:  07/14/2018   enoxaparin 40 MG/0.4ML injection Commonly known as:  LOVENOX Inject 0.4 mLs (40 mg total) into the skin daily. Start taking on:  07/14/2018   insulin aspart 100 UNIT/ML injection Commonly known as:  novoLOG Inject 0-5 Units into the skin at bedtime.   insulin aspart 100 UNIT/ML injection Commonly known as:  novoLOG Inject 0-9 Units into the skin 3 (three) times daily with meals.   insulin glargine 100 UNIT/ML injection Commonly known as:  LANTUS Inject 0.07 mLs (7 Units total) into the skin at bedtime.   losartan 50 MG tablet Commonly known as:   COZAAR Take 1 tablet (50 mg total) by mouth daily. Start taking on:  07/14/2018   metFORMIN 850 MG tablet Commonly known as:  GLUCOPHAGE Take 1 tablet (850 mg total) by mouth 2 (two) times daily with a meal.   metoprolol tartrate 25 MG tablet Commonly known as:  LOPRESSOR Take 1 tablet (25 mg total) by mouth 2 (two) times daily.   senna-docusate 8.6-50 MG tablet Commonly known as:  Senokot-S Take 2 tablets by mouth at bedtime.      Major procedures and Radiology Reports - PLEASE review detailed and final reports for all details, in brief -   Ct Angio Head W Or Wo Contrast  Result Date: 07/10/2018 CLINICAL DATA:  Right arm weakness and right leg weakness. Symptoms began about 3 days ago. Abnormal brainstem by CT. EXAM: CT ANGIOGRAPHY HEAD AND NECK TECHNIQUE: Multidetector CT imaging of the head and neck was performed using the standard protocol during bolus administration of intravenous contrast. Multiplanar CT image reconstructions and MIPs were obtained to evaluate the vascular anatomy. Carotid stenosis measurements (when applicable) are obtained utilizing NASCET criteria, using the distal internal carotid diameter as the denominator. CONTRAST:  50mL ISOVUE-370 IOPAMIDOL (ISOVUE-370) INJECTION 76% COMPARISON:  CT done yesterday. FINDINGS: CTA NECK FINDINGS Aortic arch: The aortic arch appears normal. Branching pattern of the brachiocephalic vessels is normal without origin stenosis. Right carotid system: Common carotid artery is tortuous but widely patent to the bifurcation. The carotid bifurcation does not show any plaque, stenosis or irregularity. Cervical ICA is tortuous but widely patent. Left carotid system: Left common carotid artery is widely patent to the bifurcation. No carotid bifurcation atherosclerotic disease. Cervical ICA is tortuous but widely patent. Vertebral arteries: The right vertebral artery is dominant. Both vertebral artery origins are widely patent. Both vertebral  arteries appear normal through the cervical region to the foramen magnum. Skeleton: Ordinary mid cervical spondylosis. Other neck: No soft tissue mass or lymphadenopathy. Upper chest: Negative Review of the MIP images confirms the above findings CTA HEAD FINDINGS Anterior circulation: There is atherosclerotic calcification in both carotid siphon  regions but no stenosis more than about 20 or 30%. The anterior and middle cerebral vessels are patent without proximal stenosis, aneurysm or vascular malformation. Distal branch vessels appear unremarkable. Posterior circulation: Both vertebral arteries are patent through the foramen magnum to the basilar. Dominant supply is from the right. Posterior inferior cerebellar arteries are patent. The basilar artery shows mild atherosclerotic irregularity but does not have a flow limiting stenosis. Superior cerebellar and posterior cerebral arteries appear normal. Right PCA takes fetal origin. Venous sinuses: Patent and normal. Anatomic variants: None significant. Delayed phase: No abnormal enhancement. Review of the MIP images confirms the above findings IMPRESSION: Carotid bifurcations appear normal. No anterior circulation disease of significance. Tortuous vessels suggesting a history of hypertension. Mild atherosclerotic irregularity of the basilar artery but without flow limiting stenosis. Electronically Signed   By: Paulina Fusi M.D.   On: 07/10/2018 14:12   Dg Chest 2 View  Result Date: 07/10/2018 CLINICAL DATA:  72 year old female with CVA. EXAM: CHEST - 2 VIEW COMPARISON:  None. FINDINGS: The lungs are clear. There is no pleural effusion or pneumothorax. Mild cardiomegaly. Degenerative changes of the shoulders. No acute osseous pathology. IMPRESSION: No active cardiopulmonary disease. Electronically Signed   By: Elgie Collard M.D.   On: 07/10/2018 02:11   Ct Head Wo Contrast  Result Date: 07/09/2018 CLINICAL DATA:  Patient presents with right arm weakness and  right leg weakness with right facial droop. Grand daughter also states that her speech is not her usual. EXAM: CT HEAD WITHOUT CONTRAST TECHNIQUE: Contiguous axial images were obtained from the base of the skull through the vertex without intravenous contrast. COMPARISON:  None. FINDINGS: Brain: Age related involutional changes of the brain. Age-indeterminate hypodensity involving the pons on the left. Age-indeterminate small vessel ischemic disease of periventricular and subcortical white matter. Idiopathic bilateral basal ganglial calcifications are seen. No intra-axial mass nor extra-axial fluid collections. No acute intracranial hemorrhage. Midline fourth ventricle and basal cisterns without effacement. Vascular: No hyperdense vessel sign. Moderate atherosclerosis of the carotid siphons. Skull: Intact Sinuses/Orbits: Nonacute Other: None IMPRESSION: Age-indeterminate infarct of the left pons believed to account for ill-defined hypodensity noted within. Atrophy with age-indeterminate small vessel ischemic disease is also noted though more likely to be chronic given atherosclerosis at the skull base. MRI may prove useful for further correlation. Electronically Signed   By: Tollie Eth M.D.   On: 07/09/2018 23:54   Ct Angio Neck W Or Wo Contrast  Result Date: 07/10/2018 CLINICAL DATA:  Right arm weakness and right leg weakness. Symptoms began about 3 days ago. Abnormal brainstem by CT. EXAM: CT ANGIOGRAPHY HEAD AND NECK TECHNIQUE: Multidetector CT imaging of the head and neck was performed using the standard protocol during bolus administration of intravenous contrast. Multiplanar CT image reconstructions and MIPs were obtained to evaluate the vascular anatomy. Carotid stenosis measurements (when applicable) are obtained utilizing NASCET criteria, using the distal internal carotid diameter as the denominator. CONTRAST:  50mL ISOVUE-370 IOPAMIDOL (ISOVUE-370) INJECTION 76% COMPARISON:  CT done yesterday.  FINDINGS: CTA NECK FINDINGS Aortic arch: The aortic arch appears normal. Branching pattern of the brachiocephalic vessels is normal without origin stenosis. Right carotid system: Common carotid artery is tortuous but widely patent to the bifurcation. The carotid bifurcation does not show any plaque, stenosis or irregularity. Cervical ICA is tortuous but widely patent. Left carotid system: Left common carotid artery is widely patent to the bifurcation. No carotid bifurcation atherosclerotic disease. Cervical ICA is tortuous but widely patent. Vertebral arteries: The  right vertebral artery is dominant. Both vertebral artery origins are widely patent. Both vertebral arteries appear normal through the cervical region to the foramen magnum. Skeleton: Ordinary mid cervical spondylosis. Other neck: No soft tissue mass or lymphadenopathy. Upper chest: Negative Review of the MIP images confirms the above findings CTA HEAD FINDINGS Anterior circulation: There is atherosclerotic calcification in both carotid siphon regions but no stenosis more than about 20 or 30%. The anterior and middle cerebral vessels are patent without proximal stenosis, aneurysm or vascular malformation. Distal branch vessels appear unremarkable. Posterior circulation: Both vertebral arteries are patent through the foramen magnum to the basilar. Dominant supply is from the right. Posterior inferior cerebellar arteries are patent. The basilar artery shows mild atherosclerotic irregularity but does not have a flow limiting stenosis. Superior cerebellar and posterior cerebral arteries appear normal. Right PCA takes fetal origin. Venous sinuses: Patent and normal. Anatomic variants: None significant. Delayed phase: No abnormal enhancement. Review of the MIP images confirms the above findings IMPRESSION: Carotid bifurcations appear normal. No anterior circulation disease of significance. Tortuous vessels suggesting a history of hypertension. Mild  atherosclerotic irregularity of the basilar artery but without flow limiting stenosis. Electronically Signed   By: Paulina Fusi M.D.   On: 07/10/2018 14:12   Mr Brain Wo Contrast  Result Date: 07/10/2018 CLINICAL DATA:  Right arm and leg weakness, acute onset. Brainstem stroke. EXAM: MRI HEAD WITHOUT CONTRAST TECHNIQUE: Multiplanar, multiecho pulse sequences of the brain and surrounding structures were obtained without intravenous contrast. COMPARISON:  CT angiography same day.  CT head yesterday. FINDINGS: Brain: Diffusion imaging shows a 1 x 2 cm acute infarction in the left para median pons. No other acute infarction. No old brainstem insult is identified. There is an old right cerebellar infarction. Cerebral hemispheres show an old lacunar infarction in the right caudate head and mild small vessel change of the hemispheric white matter. No large vessel territory infarction. No mass lesion, hemorrhage, hydrocephalus or extra-axial collection. Vascular: Major vessels at the base of the brain show flow. Skull and upper cervical spine: Negative Sinuses/Orbits: Clear/normal Other: None IMPRESSION: 1 x 2 cm acute infarction in the left para median pons. No evidence of hemorrhage or mass effect. Mild chronic small-vessel change elsewhere as above. Electronically Signed   By: Paulina Fusi M.D.   On: 07/10/2018 14:24    Micro Results   Recent Results (from the past 240 hour(s))  MRSA PCR Screening     Status: None   Collection Time: 07/10/18  5:46 AM  Result Value Ref Range Status   MRSA by PCR NEGATIVE NEGATIVE Final    Comment:        The GeneXpert MRSA Assay (FDA approved for NASAL specimens only), is one component of a comprehensive MRSA colonization surveillance program. It is not intended to diagnose MRSA infection nor to guide or monitor treatment for MRSA infections. Performed at [facility], [address], [place], [place] [address]        Today   Subjective    Charlei  Brumley today has no new complaints, RN and husband at bedside          Patient has been seen and examined prior to discharge   Objective   Blood pressure (!) 158/75, pulse 72, temperature 98.4 F (36.9 C), temperature source Oral, resp. rate 20, height 4\' 10"  (1.473 m), weight 58.9 kg (129 lb 13.6 oz), SpO2 99 %.   Intake/Output Summary (Last 24 hours) at 07/13/2018 1034 Last  data filed at 07/13/2018 0931 Gross per 24 hour  Intake 2488.91 ml  Output -  Net 2488.91 ml    Exam  Gen:- Awake Alert,  In no apparent distress  HEENT:- [place].AT, No sclera icterus, facial asymmetry/Rt sided droop noted Neck-Supple Neck,No JVD,.  Lungs-  CTAB , good air movement CV- S1, S2 normal Abd-  +ve B.Sounds, Abd Soft, No tenderness,    Extremity/Skin:- No  edema,   good pulses Psych-affect is appropriate, oriented x3 Neuro-upper extremity weakness is improving, right upper extremity strength is 3/5, right lower extremity strength is 4/5, no significant left-sided deficits     Data Review   CBC w Diff:  Lab Results  Component Value Date   WBC 9.8 07/10/2018   HGB 12.5 07/10/2018   HCT 42.6 07/10/2018   PLT 174 07/10/2018   LYMPHOPCT 25 07/10/2018   MONOPCT 8 07/10/2018   EOSPCT 3 07/10/2018   BASOPCT 1 07/10/2018    CMP:  Lab Results  Component Value Date   NA 142 07/10/2018   K 3.7 07/10/2018   CL 106 07/10/2018   CO2 26 07/10/2018   BUN 10 07/10/2018   CREATININE 0.87 07/10/2018   PROT 6.8 07/09/2018   ALBUMIN 3.5 07/09/2018   BILITOT 1.2 07/09/2018   ALKPHOS 102 07/09/2018   AST 14 (L) 07/09/2018   ALT 18 07/09/2018  .   Total Discharge time is about 33 minutes  Shon Haleourage Myisha Pickerel M.D on 07/13/2018 at 10:34 AM   Go to [email or web address] - password [email or web address] for contact info  [facility] - Office  [phone]

## 2018-07-13 NOTE — Progress Notes (Deleted)
Inpatient Rehabilitation-Admissions Coordinator   Physical Medicine and Rehabilitation Consult   Reason for Consult: right hemiparesis Referring Physician: [email or web address]   HPI: Samantha Paul is a 72 y.o. non-english speaking [name]  female who was admitted on 07/09/18 with 2 day history of right sided weakness, slurred speech and difficulty with ambulation. Patient with history of HTN and no medications X years per son. BP at admission 193/89 and MRI brain revealed 1 X 2 cm acute left paramedian pontine infarct.  CTA head/neck was negative for flow limiting stenosis. 2 D echo showed EF 65- 70% with moderate focal basal hypertrophy with moderate proximal septal thickening and findings s/o of HOCM. Dr. Roda ShuttersXu felt that stroke was due to small vessel disease and recommended aggressive management of stroke risk factors with  DAPT X 3 weeks followed by ASA or Plavix alone. Patient with new diagnosis of DM with Hgb A1c- 12.3. Patient limited by right sided weakness with balance deficits. CIR recommended for follow up therapy.    Review of Systems  Constitutional: Negative for chills and fever.  Eyes: Negative for blurred vision and double vision.  Respiratory: Negative for cough and shortness of breath.   Cardiovascular: Negative for chest pain and palpitations.  Gastrointestinal: Positive for abdominal pain, diarrhea and heartburn.  Genitourinary: Negative for dysuria.  Musculoskeletal: Positive for back pain, joint pain and myalgias.  Skin: Negative for rash.  Neurological: Positive for focal weakness. Negative for dizziness and headaches.  Psychiatric/Behavioral: The patient is not nervous/anxious.           Past Medical History:  Diagnosis Date  . Back pain   . HTN (hypertension)   . Knee pain      History reviewed. No pertinent surgical history.         Family History  Problem Relation Age of Onset  . Arthritis Brother      Social History:  Married.  Independent without AD. Lives with youngest son (who is unemployed at this time). She used chew till mit 90's. Per  reports that she has never smoked. She has never used smokeless tobacco. She does not use alcohol or illicit drugs.     Allergies: No Known Allergies    No medications prior to admission.    Home: Home Living Family/patient expects to be discharged to:: Private residence Living Arrangements: Spouse/significant other Available Help at Discharge: Family, Available 24 hours/day Type of Home: House Home Access: Stairs to enter Secretary/administratorntrance Stairs-Number of Steps: 2 Home Layout: One level Bathroom Shower/Tub: Engineer, manufacturing systemsTub/shower unit Bathroom Toilet: Standard Home Equipment: None  Functional History: Prior Function Level of Independence: Independent Functional Status:  Mobility: Bed Mobility Overal bed mobility: Needs Assistance Bed Mobility: Supine to Sit, Sit to Supine Supine to sit: Min assist Sit to supine: Min assist General bed mobility comments: Min assist for trunk elevation to sitting, assist for LEs back to bed. HOB flat without use of bed rails Transfers Overall transfer level: Needs assistance Equipment used: 1 person hand held assist Transfers: Sit to/from Stand Sit to Stand: Min assist General transfer comment: Min assist to boost up from EOB and for standing balance. Provided HHA thoruhgout Ambulation/Gait Ambulation/Gait assistance: Mod assist Gait Distance (Feet): 75 Feet Assistive device: Quad cane, 1 person hand held assist Gait Pattern/deviations: Decreased step length - right General Gait Details: Walked the hallway with Quad cane in pt's L hand and PT supporting R UE and shoulder girdle for better symmetry; Occasional very short R step length, leading to loss  of balance anteriorly -- improved with cues; fatigued at the end of walk Gait velocity interpretation: <1.8 ft/sec, indicate of risk for recurrent falls  ADL: ADL Overall ADL's : Needs  assistance/impaired Eating/Feeding: Minimal assistance, Sitting Eating/Feeding Details (indicate cue type and reason): Pt reporting she has been self feeding with L hand Grooming: Moderate assistance, Sitting Upper Body Bathing: Moderate assistance, Sitting Lower Body Bathing: Maximal assistance, Sit to/from stand Upper Body Dressing : Moderate assistance, Sitting Lower Body Dressing: Maximal assistance, Sit to/from stand Toilet Transfer: Moderate assistance, Ambulation Toilet Transfer Details (indicate cue type and reason): HHA, simulated by functional mobility in room Functional mobility during ADLs: Moderate assistance(HHA)  Cognition: Cognition Overall Cognitive Status: Difficult to assess Orientation Level: Oriented X4 Cognition Arousal/Alertness: Awake/alert Behavior During Therapy: WFL for tasks assessed/performed, Impulsive(slightly impulsive stand) Overall Cognitive Status: Difficult to assess Area of Impairment: Orientation, Safety/judgement Orientation Level: Disoriented to, Person Safety/Judgement: Decreased awareness of deficits, Decreased awareness of safety General Comments: Pt appears to follow commands appropriately Difficult to assess due to: Non-English speaking   Blood pressure (!) 151/76, pulse 76, temperature 98.4 F (36.9 C), temperature source Oral, resp. rate 14, height 4\' 10"  (1.473 m), weight 58.9 kg (129 lb 13.6 oz), SpO2 99 %. Physical Exam  Nursing note and vitals reviewed. Constitutional: She appears well-developed.  HENT:  Head: Normocephalic.  Eyes: Pupils are equal, round, and reactive to light.  Neck: Normal range of motion.  Cardiovascular: Normal rate.  Respiratory: Effort normal.  GI: Soft.  Neurological: She is alert.  Speech appears clear and she has reasonable awareness of deficits. Right hemiparesis, upper greater than lower. RUE 1-2/5 shoulder, biceps, triceps, 0-tr wrist and HI. RLE 2-3/5 and inconsistent. LUE and LLE 4-5/5.  Ambulated with therapy with reasonable awareness of balance/space. Followed directions well.   Psychiatric: She has a normal mood and affect. Her behavior is normal. Thought content normal.    LabResultsLast24Hours       Results for orders placed or performed during the hospital encounter of 07/09/18 (from the past 24 hour(s))  Glucose, capillary     Status: Abnormal   Collection Time: 07/11/18 12:05 PM  Result Value Ref Range   Glucose-Capillary 184 (H) 70 - 99 mg/dL  Glucose, capillary     Status: Abnormal   Collection Time: 07/11/18  4:45 PM  Result Value Ref Range   Glucose-Capillary 263 (H) 70 - 99 mg/dL  Glucose, capillary     Status: Abnormal   Collection Time: 07/11/18  9:42 PM  Result Value Ref Range   Glucose-Capillary 206 (H) 70 - 99 mg/dL  Glucose, capillary     Status: Abnormal   Collection Time: 07/12/18  8:08 AM  Result Value Ref Range   Glucose-Capillary 129 (H) 70 - 99 mg/dL      [id number])  Ct Angio Head W Or Wo Contrast  Result Date: 07/10/2018 CLINICAL DATA:  Right arm weakness and right leg weakness. Symptoms began about 3 days ago. Abnormal brainstem by CT. EXAM: CT ANGIOGRAPHY HEAD AND NECK TECHNIQUE: Multidetector CT imaging of the head and neck was performed using the standard protocol during bolus administration of intravenous contrast. Multiplanar CT image reconstructions and MIPs were obtained to evaluate the vascular anatomy. Carotid stenosis measurements (when applicable) are obtained utilizing NASCET criteria, using the distal internal carotid diameter as the denominator. CONTRAST:  50mL ISOVUE-370 IOPAMIDOL (ISOVUE-370) INJECTION 76% COMPARISON:  CT done yesterday. FINDINGS: CTA NECK FINDINGS Aortic arch: The aortic arch appears normal. Branching pattern of the  brachiocephalic vessels is normal without origin stenosis. Right carotid system: Common carotid artery is tortuous but widely patent to the bifurcation. The  carotid bifurcation does not show any plaque, stenosis or irregularity. Cervical ICA is tortuous but widely patent. Left carotid system: Left common carotid artery is widely patent to the bifurcation. No carotid bifurcation atherosclerotic disease. Cervical ICA is tortuous but widely patent. Vertebral arteries: The right vertebral artery is dominant. Both vertebral artery origins are widely patent. Both vertebral arteries appear normal through the cervical region to the foramen magnum. Skeleton: Ordinary mid cervical spondylosis. Other neck: No soft tissue mass or lymphadenopathy. Upper chest: Negative Review of the MIP images confirms the above findings CTA HEAD FINDINGS Anterior circulation: There is atherosclerotic calcification in both carotid siphon regions but no stenosis more than about 20 or 30%. The anterior and middle cerebral vessels are patent without proximal stenosis, aneurysm or vascular malformation. Distal branch vessels appear unremarkable. Posterior circulation: Both vertebral arteries are patent through the foramen magnum to the basilar. Dominant supply is from the right. Posterior inferior cerebellar arteries are patent. The basilar artery shows mild atherosclerotic irregularity but does not have a flow limiting stenosis. Superior cerebellar and posterior cerebral arteries appear normal. Right PCA takes fetal origin. Venous sinuses: Patent and normal. Anatomic variants: None significant. Delayed phase: No abnormal enhancement. Review of the MIP images confirms the above findings IMPRESSION: Carotid bifurcations appear normal. No anterior circulation disease of significance. Tortuous vessels suggesting a history of hypertension. Mild atherosclerotic irregularity of the basilar artery but without flow limiting stenosis. Electronically Signed   By: Paulina Fusi M.D.   On: 07/10/2018 14:12   Ct Angio Neck W Or Wo Contrast  Result Date: 07/10/2018 CLINICAL DATA:  Right arm weakness and right  leg weakness. Symptoms began about 3 days ago. Abnormal brainstem by CT. EXAM: CT ANGIOGRAPHY HEAD AND NECK TECHNIQUE: Multidetector CT imaging of the head and neck was performed using the standard protocol during bolus administration of intravenous contrast. Multiplanar CT image reconstructions and MIPs were obtained to evaluate the vascular anatomy. Carotid stenosis measurements (when applicable) are obtained utilizing NASCET criteria, using the distal internal carotid diameter as the denominator. CONTRAST:  50mL ISOVUE-370 IOPAMIDOL (ISOVUE-370) INJECTION 76% COMPARISON:  CT done yesterday. FINDINGS: CTA NECK FINDINGS Aortic arch: The aortic arch appears normal. Branching pattern of the brachiocephalic vessels is normal without origin stenosis. Right carotid system: Common carotid artery is tortuous but widely patent to the bifurcation. The carotid bifurcation does not show any plaque, stenosis or irregularity. Cervical ICA is tortuous but widely patent. Left carotid system: Left common carotid artery is widely patent to the bifurcation. No carotid bifurcation atherosclerotic disease. Cervical ICA is tortuous but widely patent. Vertebral arteries: The right vertebral artery is dominant. Both vertebral artery origins are widely patent. Both vertebral arteries appear normal through the cervical region to the foramen magnum. Skeleton: Ordinary mid cervical spondylosis. Other neck: No soft tissue mass or lymphadenopathy. Upper chest: Negative Review of the MIP images confirms the above findings CTA HEAD FINDINGS Anterior circulation: There is atherosclerotic calcification in both carotid siphon regions but no stenosis more than about 20 or 30%. The anterior and middle cerebral vessels are patent without proximal stenosis, aneurysm or vascular malformation. Distal branch vessels appear unremarkable. Posterior circulation: Both vertebral arteries are patent through the foramen magnum to the basilar. Dominant supply is  from the right. Posterior inferior cerebellar arteries are patent. The basilar artery shows mild atherosclerotic irregularity but does not  have a flow limiting stenosis. Superior cerebellar and posterior cerebral arteries appear normal. Right PCA takes fetal origin. Venous sinuses: Patent and normal. Anatomic variants: None significant. Delayed phase: No abnormal enhancement. Review of the MIP images confirms the above findings IMPRESSION: Carotid bifurcations appear normal. No anterior circulation disease of significance. Tortuous vessels suggesting a history of hypertension. Mild atherosclerotic irregularity of the basilar artery but without flow limiting stenosis. Electronically Signed   By: Paulina Fusi M.D.   On: 07/10/2018 14:12   Mr Brain Wo Contrast  Result Date: 07/10/2018 CLINICAL DATA:  Right arm and leg weakness, acute onset. Brainstem stroke. EXAM: MRI HEAD WITHOUT CONTRAST TECHNIQUE: Multiplanar, multiecho pulse sequences of the brain and surrounding structures were obtained without intravenous contrast. COMPARISON:  CT angiography same day.  CT head yesterday. FINDINGS: Brain: Diffusion imaging shows a 1 x 2 cm acute infarction in the left para median pons. No other acute infarction. No old brainstem insult is identified. There is an old right cerebellar infarction. Cerebral hemispheres show an old lacunar infarction in the right caudate head and mild small vessel change of the hemispheric white matter. No large vessel territory infarction. No mass lesion, hemorrhage, hydrocephalus or extra-axial collection. Vascular: Major vessels at the base of the brain show flow. Skull and upper cervical spine: Negative Sinuses/Orbits: Clear/normal Other: None IMPRESSION: 1 x 2 cm acute infarction in the left para median pons. No evidence of hemorrhage or mass effect. Mild chronic small-vessel change elsewhere as above. Electronically Signed   By: Paulina Fusi M.D.   On: 07/10/2018 14:24       Assessment/Plan: Diagnosis: left pontine infarct 1. Does the need for close, 24 hr/day medical supervision in concert with the patient's rehab needs make it unreasonable for this patient to be served in a less intensive setting? Yes 2. Co-Morbidities requiring supervision/potential complications: HTN, post-stroke sequelae 3. Due to bladder management, bowel management, safety, skin/wound care, disease management, medication administration, pain management and patient education, does the patient require 24 hr/day rehab nursing? Yes 4. Does the patient require coordinated care of a physician, rehab nurse, PT (1-2 hrs/day, 5 days/week) and OT (1-2 hrs/day, 5 days/week) , probably not SLP, to address physical and functional deficits in the context of the above medical diagnosis(es)? Yes Addressing deficits in the following areas: balance, endurance, locomotion, strength, transferring, bowel/bladder control, bathing, dressing, feeding, grooming, toileting and psychosocial support 5. Can the patient actively participate in an intensive therapy program of at least 3 hrs of therapy per day at least 5 days per week? Yes 6. The potential for patient to make measurable gains while on inpatient rehab is excellent 7. Anticipated functional outcomes upon discharge from inpatient rehab are modified independent  with PT, modified independent and supervision with OT, n/a with SLP. 8. Estimated rehab length of stay to reach the above functional goals is: 7-12 days 9. Anticipated D/C setting: Home 10. Anticipated post D/C treatments: HH therapy and Outpatient therapy 11. Overall Rehab/Functional Prognosis: excellent  RECOMMENDATIONS: This patient's condition is appropriate for continued rehabilitative care in the following setting: CIR Patient has agreed to participate in recommended program. Yes and Potentially Note that insurance prior authorization may be required for reimbursement for recommended  care.  Comment: Rehab Admissions Coordinator to follow up.  Thanks,  Ranelle Oyster, MD, [place] [facility]  I have personally performed a face to face diagnostic evaluation of this patient. Additionally, I have reviewed and concur with the physician assistant's documentation above.    Rinaldo Cloud  Imagene Gurney, PA-C 07/12/2018          Revision History                             Routing History

## 2018-07-13 NOTE — Progress Notes (Signed)
PMR Admission Coordinator Pre-Admission Assessment  Patient: Samantha Paul is an 72 y.o., female MRN: [id number] DOB: [birth year] Height: 4\' 10"  (147.3 cm) Weight: 58.9 kg (129 lb 13.6 oz)                                                                                                                                                  Insurance Information HMO: Yes    PPO:      PCP:      IPA:      80/20:      OTHER:  PRIMARY: [employer] Medicare      Policy#: [id number]      Subscriber: Patient CM Name: Rebeca Alert      Phone#: [phone]     Fax#: [phone] Auth provided by [employer] on 07/13/18 with pt approved for 7 days (admit day 07/13/18, with updates due 07/20/18 to Rebeca Alert) Pre-Cert#: [id number]      Employer:  Benefits:  Phone #: NA     Name: Online Portal- [email or web address] Eff. Date: 12/26/17     Deduct: $0      Out of Pocket Max: $6,700      Life Max: NA CIR: $430/day for days 1-4; $0/day for days 5+      SNF: $0/day for days 1-20; $160/day for days 21-62, $0/day for days 63-100 (100 day SNF limit) Outpatient: per necessirty     Co-Pay: $40/visit Home Health: per necessity 100%      Co-Pay:  DME: 80%     Co-Pay: 20% Providers:   Medicaid Application Date:       Case Manager:  Disability Application Date:       Case Worker:   Emergency Contact Information         Contact Information    Name Relation Home Work Mobile   [name] Spouse   [phone]   [name],Samantha Paul   [phone]   Samantha Paul   [phone]     Current Medical History  Patient Admitting Diagnosis: Left Pontine Infarct History of Present Illness: Samantha Paul a 72 y.o.non-english speaking [place] Samantha Paul dialect)femalewho was admitted on 07/09/18 with 2 day history of right sided weakness, slurred speech and difficulty with ambulation.Patient with history of HTN and no medications X years per Paul.BP at admission 193/89 and MRI brain revealed 1 X 2 cm acute left paramedian pontine infarct. CTA  head/neck was negative for flow limiting stenosis. 2 D echo showed EF 65- 70% with moderate focal basal hypertrophy with moderate proximal septal thickening and findings s/o of HOCM. Dr. Roda Shutters felt that stroke was due to small vessel disease and recommended aggressive management of stroke risk factors with DAPT X 3 weeks followed by ASA or Plavix alone. Patient with new diagnosis of DM with Hgb A1c- 12.3. Patient limited by right sided weakness with balance deficits. CIR recommended for follow  up therapy. Pt is to be admitted to CIR on 07/13/18.    Total: 6  Past Medical History      Past Medical History:  Diagnosis Date  . Back pain   . HTN (hypertension)   . Knee pain     Family History  family history includes Arthritis in her brother.  Prior Rehab/Hospitalizations:  Has the patient had major surgery during 100 days prior to admission? No  Current Medications   Current Facility-Administered Medications:  .  0.9 %  sodium chloride infusion, , Intravenous, Continuous, Marvel Plan, MD, Last Rate: 75 mL/hr at 07/13/18 0800 .  acetaminophen (TYLENOL) tablet 650 mg, 650 mg, Oral, Q4H PRN, 650 mg at 07/10/18 1949 **OR** acetaminophen (TYLENOL) solution 650 mg, 650 mg, Per Tube, Q4H PRN **OR** acetaminophen (TYLENOL) suppository 650 mg, 650 mg, Rectal, Q4H PRN, Smith, Rondell A, MD .  amLODipine (NORVASC) tablet 10 mg, 10 mg, Oral, Daily, Marvel Plan, MD, 10 mg at 07/13/18 0909 .  aspirin EC tablet 81 mg, 81 mg, Oral, Daily, Katrinka Blazing, Rondell A, MD, 81 mg at 07/13/18 0909 .  atorvastatin (LIPITOR) tablet 40 mg, 40 mg, Oral, q1800, Madelyn Flavors A, MD, 40 mg at 07/12/18 1713 .  clopidogrel (PLAVIX) tablet 75 mg, 75 mg, Oral, Daily, Marvel Plan, MD, 75 mg at 07/13/18 0909 .  enoxaparin (LOVENOX) injection 40 mg, 40 mg, Subcutaneous, Q24H, Smith, Rondell A, MD, 40 mg at 07/13/18 4098 .  hydrALAZINE (APRESOLINE) injection 10 mg, 10 mg, Intravenous, Q4H PRN, Katrinka Blazing, Rondell A, MD, 10 mg at  07/10/18 0454 .  insulin aspart (novoLOG) injection 0-5 Units, 0-5 Units, Subcutaneous, QHS, Clydie Braun, MD, 2 Units at 07/11/18 2156 .  insulin aspart (novoLOG) injection 0-9 Units, 0-9 Units, Subcutaneous, TID WC, Clydie Braun, MD, 2 Units at 07/13/18 0804 .  insulin glargine (LANTUS) injection 7 Units, 7 Units, Subcutaneous, QHS, Emokpae, Courage, MD .  living well with diabetes book MISC, , Does not apply, Once, Emokpae, Courage, MD .  losartan (COZAAR) tablet 50 mg, 50 mg, Oral, Daily, Marvel Plan, MD, 50 mg at 07/13/18 0909 .  metFORMIN (GLUCOPHAGE) tablet 850 mg, 850 mg, Oral, BID WC, Emokpae, Courage, MD, 850 mg at 07/13/18 0804 .  metoprolol tartrate (LOPRESSOR) tablet 25 mg, 25 mg, Oral, BID, Emokpae, Courage, MD, 25 mg at 07/13/18 0908 .  senna-docusate (Senokot-S) tablet 2 tablet, 2 tablet, Oral, QHS, Emokpae, Courage, MD, 2 tablet at 07/12/18 2159  Patients Current Diet:       Diet Order           Diet - low sodium heart healthy        Diet Carb Modified        Diet Carb Modified Fluid consistency: Thin; Room service appropriate? Yes  Diet effective now          Precautions / Restrictions Precautions Precautions: Fall Precaution Comments: right hemiparesis, some inattention Restrictions Weight Bearing Restrictions: No   Has the patient had 2 or more falls or a fall with injury in the past year?No  Prior Activity Level Community (5-7x/wk): Yes; but was not working or driving.   Home Assistive Devices / Equipment Home Assistive Devices/Equipment: None Home Equipment: None  Prior Device Use: Indicate devices/aids used by the patient prior to current illness, exacerbation or injury? None of the above  Prior Functional Level Prior Function Level of Independence: Independent  Self Care: Did the patient need help bathing, dressing, using the toilet or eating?  Independent  Indoor Mobility: Did the patient need assistance with walking  from room to room (with or without device)? Independent  Stairs: Did the patient need assistance with internal or external stairs (with or without device)? Independent  Functional Cognition: Did the patient need help planning regular tasks such as shopping or remembering to take medications? Independent  Current Functional Level Cognition  Overall Cognitive Status: Within Functional Limits for tasks assessed Difficult to assess due to: Non-English speaking Orientation Level: Oriented X4 Safety/Judgement: Decreased awareness of deficits, Decreased awareness of safety General Comments: Pt appears to follow commands appropriately    Extremity Assessment (includes Sensation/Coordination)  Upper Extremity Assessment: RUE deficits/detail RUE Deficits / Details: pt with limited strength grossly 2-/5 elbow flexion, no noted digits movement, very limited shoulder flexion, grossly intact light touch with limited proprioception RUE Sensation: decreased proprioception RUE Coordination: decreased fine motor, decreased gross motor  Lower Extremity Assessment: Defer to PT evaluation RLE Deficits / Details: grossly 2/5 but difficult to fully assess due to language barrier    ADLs  Overall ADL's : Needs assistance/impaired Eating/Feeding: Minimal assistance, Sitting Eating/Feeding Details (indicate cue type and reason): Pt reporting she has been self feeding with L hand Grooming: Moderate assistance, Sitting, Wash/dry hands Upper Body Bathing: Moderate assistance, Sitting Lower Body Bathing: Maximal assistance, Sit to/from stand Upper Body Dressing : Moderate assistance, Sitting Lower Body Dressing: Maximal assistance, Sit to/from stand Toilet Transfer: Moderate assistance, Ambulation Toilet Transfer Details (indicate cue type and reason): HHA, simulated by functional mobility in room Functional mobility during ADLs: Moderate assistance(HHA)    Mobility  Overal bed mobility: Needs  Assistance Bed Mobility: Supine to Sit, Sit to Supine Supine to sit: Min assist(assist with pad to bring hips EOB and trunk elevation) Sit to supine: Min assist(assist for RLE back into bed) General bed mobility comments: sitting EOB upon my entry    Transfers  Overall transfer level: Needs assistance Equipment used: 1 person hand held assist Transfers: Sit to/from Stand Sit to Stand: Min assist General transfer comment: pt up from EOB assist for balance    Ambulation / Gait / Stairs / Wheelchair Mobility  Ambulation/Gait Ambulation/Gait assistance: Mod assist Gait Distance (Feet): 120 Feet Assistive device: 1 person hand held assist, Quad cane Gait Pattern/deviations: Step-to pattern, Step-through pattern, Decreased stance time - right, Drifts right/left General Gait Details: cues for placement of cane, assist for R UE about 80% of the walk, at times held it at her side with some evidence of increasing tone, facilitation at ribs for L weight shift to ensure clearance of R foot Gait velocity interpretation: <1.8 ft/sec, indicate of risk for recurrent falls    Posture / Balance Balance Overall balance assessment: Needs assistance Sitting-balance support: Feet unsupported Sitting balance-Leahy Scale: Good Standing balance support: Single extremity supported Standing balance-Leahy Scale: Poor Standing balance comment: LUE support in standing High Level Balance Comments: sit<>stand for balance and LE strength with placement of R LE and cues for weight shift x 5 reps    Special needs/care consideration BiPAP/CPAP: No CPM: No Continuous Drip IV: No Dialysis: No        Days: No Life Vest: No Oxygen: No Special Bed: No Trach Size: No Wound Vac (area): No      Location: NA Skin: bruising to L forearm                              Location Bowel mgmt: [facility]; Last  BM according to chart, 07/12/18 Bladder mgmt:[facility] Diabetic mgmt: pt is new diabetic with possible need for  education and training.      Previous Home Environment Living Arrangements: Spouse/significant other Available Help at Discharge: Family, Available 24 hours/day Type of Home: House Home Layout: One level Home Access: Stairs to enter [employer] of Steps: 2 Bathroom Shower/Tub: Engineer, manufacturing systems: Standard Home Care Services: No  Discharge Living Setting Plans for Discharge Living Setting: Patient's home, Lives with (comment)(lives with husband, Paul, and 4 grandchildren) Type of Home at Discharge: House Discharge Home Layout: One level Discharge Home Access: Stairs to enter Entrance Stairs-Rails: Can reach both Entrance Stairs-Number of Steps: 3 Discharge Bathroom Shower/Tub: Tub/shower unit Discharge Bathroom Toilet: Standard Discharge Bathroom Accessibility: Yes How Accessible: Accessible via walker Does the patient have any problems obtaining your medications?: No  Social/Family/Support Systems Patient Roles: Spouse Contact Information: husband is emergency contact Anticipated Caregiver: husband, Paul, family Anticipated Caregiver's Contact Information: husband Buddy Duty) [phone] Ability/Limitations of Caregiver: husband has LUE deformity (broken per his report) and gout in RUE currently but able to assist as able; Paul is physically able to assist Caregiver Availability: 24/7  Discharge Plan Discussed with Primary Caregiver: Yes Is Caregiver In Agreement with Plan?: Yes Does Caregiver/Family have Issues with Lodging/Transportation while Pt is in Rehab?: No   Goals/Additional Needs Patient/Family Goal for Rehab: PT: Mod I; OT: Mod I/Sup; SLP: NA Expected length of stay: 7-12 days Cultural Considerations: Protestant Christian; Therapist, sports (dialet spoken and understood) Dietary Needs: Carb Modified; 1600-2000 calorie; thin liquids Equipment Needs: TBD Special Service Needs: Interpreter services needed: dialet is [place] Pt/Family Agrees to  Admission and willing to participate: Yes Program Orientation Provided & Reviewed with Pt/Caregiver Including Roles  & Responsibilities: Yes(pt and husband; info provided for Paul)  Barriers to Discharge: Home environment access/layout, Other (comments)  Barriers to Discharge Comments: pt is new diabetic and would benefit from training   Decrease burden of Care through IP rehab admission: NA   Possible need for SNF placement upon discharge:Not anticipated; pt has 24/7 A at home with large family and good support.    Patient Condition: This patient's condition remains as documented in the consult dated 07/11/18, in which the Rehabilitation Physician determined and documented that the patient's condition is appropriate for intensive rehabilitative care in an inpatient rehabilitation facility. Will admit to inpatient rehab today.  Preadmission Screen Completed By:  Nanine Means, 07/13/2018 11:06 AM ______________________________________________________________________   Discussed status with Dr. Riley Kill on 07/13/18 at 11:06PM and received telephone approval for admission today.  Admission Coordinator:  Nanine Means, time 11:06PM Dorna Bloom 07/13/18.             Cosigned by: Ranelle Oyster, MD at 07/13/2018 11:13 AM  Revision History

## 2018-07-13 NOTE — Discharge Instructions (Signed)
1)You are taking aspirin and Plavix due to acute stroke so Avoid ibuprofen/Advil/Aleve/Motrin/Goody Powders/Naproxen/BC powders/Meloxicam/Diclofenac/Indomethacin and other Nonsteroidal anti-inflammatory medications as these will make you more likely to bleed and can cause stomach ulcers, can also cause Kidney problems.   2)You have Diabetes and will need to be on medications post discharge for diabetes/blood sugar problems in order to reduce your risk for another stroke  3) you have a blood pressure and you need to be on medications for blood pressure problems before discharge- in order to reduce your risk for another stroke  4) you need to take cholesterol medicine for the rest of your life to reduce your risk for another stroke

## 2018-07-13 NOTE — Progress Notes (Signed)
Inpatient Diabetes Program Recommendations  AACE/ADA: New Consensus Statement on Inpatient Glycemic Control (2015)  Target Ranges:  Prepandial:   less than 140 mg/dL      Peak postprandial:   less than 180 mg/dL (1-2 hours)      Critically ill patients:  140 - 180 mg/dL   Diabetes history: New onset DM Outpatient Diabetes medications: None Current orders for Inpatient glycemic control:  Novolog sensitive tid with meals and HS, Lantus 7 units q HS, Metformin 850 mg bid  Inpatient Diabetes Program Recommendations:    Per DM Coordinator evaluation from yesterday patient would not be appropriate or safe to administer and understand insulin at home.   Consider starting Amaryl 1 mg daily in addition to Metformin and reduce or d/c Lantus dose.  Will need continued education and reinforcement.    Thanks,  Christena DeemShannon Vallorie Niccoli RN, MSN, BC-ADM, [facility] Inpatient Diabetes Coordinator Team Pager [phone] (8a-5p)

## 2018-07-13 NOTE — Progress Notes (Signed)
Inpatient Rehabilitation-Admissions Coordinator   Spoke with pt and family regarding final decision for dispo (interpreter Lek present). Pt and family have decided to pursue CIR for rehab. AC has begun insurance authorization process.   Please call if questions.   Nanine MeansKelly Dreshaun Stene, OTR/L  Rehab Admissions Coordinator  [phone] 07/13/2018 9:19 AM

## 2018-07-13 NOTE — H&P (Signed)
Physical Medicine and Rehabilitation Admission H&P       Chief Complaint  Patient presents with  . Stroke Symptoms   HPI: Samantha Paul is a 72 year old female with history of untreated HTN who was admitted on 07/09/2018 with 2-day history of right-sided weakness, slurred speech and difficulty walking.  Blood pressure at admission was 193/89 and MRI brain done revealing 1 x 2 cm acute left paramedian pontine infarct.  CT head neck was negative for flow-limiting stenosis.  2D echo showed EF of 65 to 70% with moderate focal basal hypertrophy and moderate proximal septal thickening with findings consistent suggestive of HOCM.  Dr. Raynald Kemp felt that stroke was due to small vessel disease and recommended aggressive stroke risk factors management.  She is to continue DAPT x3 weeks followed by aspirin or Plavix alone.  Patient with new diagnosis of diabetes with hemoglobin A1c of 12.3 and she was started on Lantus insulin.  Currently patient continues to be limited by right-sided weakness with balance deficits.  CIR was recommended due to functional deficits.   Review of Systems  Constitutional: Negative for chills and fever.  HENT: Negative for hearing loss and tinnitus.   Eyes: Negative for blurred vision and double vision.  Respiratory: Negative for shortness of breath and wheezing.   Cardiovascular: Negative for chest pain, palpitations and leg swelling.  Gastrointestinal: Positive for heartburn and nausea. Negative for abdominal pain and constipation.  Genitourinary: Negative for dysuria and urgency.  Musculoskeletal: Positive for back pain (low back pain) and joint pain (right knee pain).  Skin: Negative for rash.  Neurological: Positive for focal weakness. Negative for dizziness and headaches.  Psychiatric/Behavioral: The patient is nervous/anxious and has insomnia (due to right sided weakness).         Past Medical History:  Diagnosis Date  . Back pain   . HTN (hypertension)    . Knee pain     History reviewed. No pertinent surgical history.         Family History  Problem Relation Age of Onset  . Arthritis Brother     Social History:  Married. Independent without AD PTA. Lives with son and husband. She stopped smoking and using alcohol in mid 90's. She does not use illicit drugs.     Allergies: No Known Allergies    No medications prior to admission.    Drug Regimen Review  Drug regimen was reviewed and remains appropriate with no significant issues identified  Home: Home Living Family/patient expects to be discharged to:: Private residence Living Arrangements: Spouse/significant other Available Help at Discharge: Family, Available 24 hours/day Type of Home: House Home Access: Stairs to enter Secretary/administrator of Steps: 2 Home Layout: One level Bathroom Shower/Tub: Engineer, manufacturing systems: Standard Home Equipment: None   Functional History: Prior Function Level of Independence: Independent  Functional Status:  Mobility: Bed Mobility Overal bed mobility: Needs Assistance Bed Mobility: Supine to Sit, Sit to Supine Supine to sit: Min assist(assist with pad to bring hips EOB and trunk elevation) Sit to supine: Min assist(assist for RLE back into bed) General bed mobility comments: sitting EOB upon my entry Transfers Overall transfer level: Needs assistance Equipment used: 1 person hand held assist Transfers: Sit to/from Stand Sit to Stand: Min assist General transfer comment: pt up from EOB assist for balance Ambulation/Gait Ambulation/Gait assistance: Mod assist Gait Distance (Feet): 120 Feet Assistive device: 1 person hand held assist, Quad cane Gait Pattern/deviations: Step-to pattern, Step-through pattern, Decreased  stance time - right, Drifts right/left General Gait Details: cues for placement of cane, assist for R UE about 80% of the walk, at times held it at her side with some evidence of  increasing tone, facilitation at ribs for L weight shift to ensure clearance of R foot Gait velocity interpretation: <1.8 ft/sec, indicate of risk for recurrent falls  ADL: ADL Overall ADL's : Needs assistance/impaired Eating/Feeding: Minimal assistance, Sitting Eating/Feeding Details (indicate cue type and reason): Pt reporting she has been self feeding with L hand Grooming: Moderate assistance, Sitting, Wash/dry hands Upper Body Bathing: Moderate assistance, Sitting Lower Body Bathing: Maximal assistance, Sit to/from stand Upper Body Dressing : Moderate assistance, Sitting Lower Body Dressing: Maximal assistance, Sit to/from stand Toilet Transfer: Moderate assistance, Ambulation Toilet Transfer Details (indicate cue type and reason): HHA, simulated by functional mobility in room Functional mobility during ADLs: Moderate assistance(HHA)  Cognition: Cognition Overall Cognitive Status: Within Functional Limits for tasks assessed Orientation Level: Oriented X4 Cognition Arousal/Alertness: Awake/alert Behavior During Therapy: WFL for tasks assessed/performed Overall Cognitive Status: Within Functional Limits for tasks assessed Area of Impairment: Orientation, Safety/judgement Orientation Level: Disoriented to, Person Safety/Judgement: Decreased awareness of deficits, Decreased awareness of safety General Comments: Pt appears to follow commands appropriately Difficult to assess due to: Non-English speaking   Blood pressure (!) 158/75, pulse 72, temperature 98.3 F (36.8 C), temperature source Oral, resp. rate 20, height 4\' 10"  (1.473 m), weight 58.9 kg (129 lb 13.6 oz), SpO2 99 %. Physical Exam  Nursing note and vitals reviewed. Constitutional: She is oriented to person, place, and time. She appears well-developed and well-nourished.  HENT:  Head: Normocephalic and atraumatic.  Eyes: Pupils are equal, round, and reactive to light. EOM are normal.  Neck: Normal range of motion.   Cardiovascular: Normal rate and regular rhythm. Exam reveals no friction rub.  No murmur heard. Respiratory: Effort normal and breath sounds normal.  GI: Soft. She exhibits no distension. There is no tenderness.  Musculoskeletal: Normal range of motion.  Neurological: She is alert and oriented to person, place, and time.  Right central 7. Follows simple commands. RUE 1/5 deltoid, 2/5 biceps and triceps and 4/5 wrist and HI. RLE: 3-4/5 HF, KE and 3/5 ADF/PF.   Skin: Skin is warm and dry.  Psychiatric:  anxious    LabResultsLast48Hours       Results for orders placed or performed during the hospital encounter of 07/09/18 (from the past 48 hour(s))  Glucose, capillary     Status: Abnormal   Collection Time: 07/11/18  4:45 PM  Result Value Ref Range   Glucose-Capillary 263 (H) 70 - 99 mg/dL  Glucose, capillary     Status: Abnormal   Collection Time: 07/11/18  9:42 PM  Result Value Ref Range   Glucose-Capillary 206 (H) 70 - 99 mg/dL  Glucose, capillary     Status: Abnormal   Collection Time: 07/12/18  8:08 AM  Result Value Ref Range   Glucose-Capillary 129 (H) 70 - 99 mg/dL  Glucose, capillary     Status: Abnormal   Collection Time: 07/12/18 11:56 AM  Result Value Ref Range   Glucose-Capillary 189 (H) 70 - 99 mg/dL  Glucose, capillary     Status: Abnormal   Collection Time: 07/12/18  4:37 PM  Result Value Ref Range   Glucose-Capillary 255 (H) 70 - 99 mg/dL  Glucose, capillary     Status: Abnormal   Collection Time: 07/12/18  9:21 PM  Result Value Ref Range   Glucose-Capillary 197 (  H) 70 - 99 mg/dL  Glucose, capillary     Status: Abnormal   Collection Time: 07/13/18  7:58 AM  Result Value Ref Range   Glucose-Capillary 157 (H) 70 - 99 mg/dL     [id number])  No results found.         Post Admission Physician Evaluation: 1. Functional deficits secondary  to left pontine infarct. 2. Patient is admitted to receive  collaborative, interdisciplinary care between the physiatrist, rehab nursing staff, and therapy team. 3. Patient's level of medical complexity and substantial therapy needs in context of that medical necessity cannot be provided at a lesser intensity of care such as a SNF. 4. Patient has experienced substantial functional loss from his/her baseline which was documented above under the "Functional History" and "Functional Status" headings.  Judging by the patient's diagnosis, physical exam, and functional history, the patient has potential for functional progress which will result in measurable gains while on inpatient rehab.  These gains will be of substantial and practical use upon discharge  in facilitating mobility and self-care at the household level. 5. Physiatrist will provide 24 hour management of medical needs as well as oversight of the therapy plan/treatment and provide guidance as appropriate regarding the interaction of the two. 6. The Preadmission Screening has been reviewed and patient status is unchanged unless otherwise stated above. 7. 24 hour rehab nursing will assist with bladder management, bowel management, safety, skin/wound care, disease management, medication administration and patient education  and help integrate therapy concepts, techniques,education, etc. 8. PT will assess and treat for/with: Lower extremity strength, range of motion, stamina, balance, functional mobility, safety, adaptive techniques and equipment, NMR, family education.   Goals are: mod I. 9. OT will assess and treat for/with: ADL's, functional mobility, safety, upper extremity strength, adaptive techniques and equipment, NMR, family education, community reentry.   Goals are: mod I to supervision. Therapy may proceed with showering this patient. 10. SLP will assess and treat for/with: n/a.  Goals are: n/a. 11. Case Management and Social Worker will assess and treat for psychological issues and discharge  planning. 12. Team conference will be held weekly to assess progress toward goals and to determine barriers to discharge. 13. Patient will receive at least 3 hours of therapy per day at least 5 days per week. 14. ELOS: 7-12 days       15. Prognosis:  excellent      Ranelle Oyster, MD 07/13/2018            Jacquelynn Cree, PA-C 07/13/2018

## 2018-07-13 NOTE — Progress Notes (Signed)
Physical Medicine and Rehabilitation Consult   Reason for Consult: right hemiparesis Referring Physician: [email or web address]   HPI: Samantha Paul is a 72 y.o. non-english speaking [place]  female who was admitted on 07/09/18 with 2 day history of right sided weakness, slurred speech and difficulty with ambulation. Patient with history of HTN and no medications X years per son. BP at admission 193/89 and MRI brain revealed 1 X 2 cm acute left paramedian pontine infarct.  CTA head/neck was negative for flow limiting stenosis. 2 D echo showed EF 65- 70% with moderate focal basal hypertrophy with moderate proximal septal thickening and findings s/o of HOCM. Dr. Roda Shutters felt that stroke was due to small vessel disease and recommended aggressive management of stroke risk factors with  DAPT X 3 weeks followed by ASA or Plavix alone. Patient with new diagnosis of DM with Hgb A1c- 12.3. Patient limited by right sided weakness with balance deficits. CIR recommended for follow up therapy.    Review of Systems  Constitutional: Negative for chills and fever.  Eyes: Negative for blurred vision and double vision.  Respiratory: Negative for cough and shortness of breath.   Cardiovascular: Negative for chest pain and palpitations.  Gastrointestinal: Positive for abdominal pain, diarrhea and heartburn.  Genitourinary: Negative for dysuria.  Musculoskeletal: Positive for back pain, joint pain and myalgias.  Skin: Negative for rash.  Neurological: Positive for focal weakness. Negative for dizziness and headaches.  Psychiatric/Behavioral: The patient is not nervous/anxious.       Past Medical History:  Diagnosis Date  . Back pain   . HTN (hypertension)   . Knee pain      History reviewed. No pertinent surgical history.         Family History  Problem Relation Age of Onset  . Arthritis Brother      Social History:  Married. Independent without AD. Lives with youngest son (who is  unemployed at this time). She used chew till mit 90's. Per  reports that she has never smoked. She has never used smokeless tobacco. She does not use alcohol or illicit drugs.     Allergies: No Known Allergies    No medications prior to admission.    Home: Home Living Family/patient expects to be discharged to:: Private residence Living Arrangements: Spouse/significant other Available Help at Discharge: Family, Available 24 hours/day Type of Home: House Home Access: Stairs to enter Secretary/administrator of Steps: 2 Home Layout: One level Bathroom Shower/Tub: Engineer, manufacturing systems: Standard Home Equipment: None  Functional History: Prior Function Level of Independence: Independent Functional Status:  Mobility: Bed Mobility Overal bed mobility: Needs Assistance Bed Mobility: Supine to Sit, Sit to Supine Supine to sit: Min assist Sit to supine: Min assist General bed mobility comments: Min assist for trunk elevation to sitting, assist for LEs back to bed. HOB flat without use of bed rails Transfers Overall transfer level: Needs assistance Equipment used: 1 person hand held assist Transfers: Sit to/from Stand Sit to Stand: Min assist General transfer comment: Min assist to boost up from EOB and for standing balance. Provided HHA thoruhgout Ambulation/Gait Ambulation/Gait assistance: Mod assist Gait Distance (Feet): 75 Feet Assistive device: Quad cane, 1 person hand held assist Gait Pattern/deviations: Decreased step length - right General Gait Details: Walked the hallway with Quad cane in pt's L hand and PT supporting R UE and shoulder girdle for better symmetry; Occasional very short R step length, leading to loss of balance anteriorly -- improved with cues; fatigued at  the end of walk Gait velocity interpretation: <1.8 ft/sec, indicate of risk for recurrent falls  ADL: ADL Overall ADL's : Needs assistance/impaired Eating/Feeding: Minimal assistance,  Sitting Eating/Feeding Details (indicate cue type and reason): Pt reporting she has been self feeding with L hand Grooming: Moderate assistance, Sitting Upper Body Bathing: Moderate assistance, Sitting Lower Body Bathing: Maximal assistance, Sit to/from stand Upper Body Dressing : Moderate assistance, Sitting Lower Body Dressing: Maximal assistance, Sit to/from stand Toilet Transfer: Moderate assistance, Ambulation Toilet Transfer Details (indicate cue type and reason): HHA, simulated by functional mobility in room Functional mobility during ADLs: Moderate assistance(HHA)  Cognition: Cognition Overall Cognitive Status: Difficult to assess Orientation Level: Oriented X4 Cognition Arousal/Alertness: Awake/alert Behavior During Therapy: WFL for tasks assessed/performed, Impulsive(slightly impulsive stand) Overall Cognitive Status: Difficult to assess Area of Impairment: Orientation, Safety/judgement Orientation Level: Disoriented to, Person Safety/Judgement: Decreased awareness of deficits, Decreased awareness of safety General Comments: Pt appears to follow commands appropriately Difficult to assess due to: Non-English speaking   Blood pressure (!) 151/76, pulse 76, temperature 98.4 F (36.9 C), temperature source Oral, resp. rate 14, height 4\' 10"  (1.473 m), weight 58.9 kg (129 lb 13.6 oz), SpO2 99 %. Physical Exam  Nursing note and vitals reviewed. Constitutional: She appears well-developed.  HENT:  Head: Normocephalic.  Eyes: Pupils are equal, round, and reactive to light.  Neck: Normal range of motion.  Cardiovascular: Normal rate.  Respiratory: Effort normal.  GI: Soft.  Neurological: She is alert.  Speech appears clear and she has reasonable awareness of deficits. Right hemiparesis, upper greater than lower. RUE 1-2/5 shoulder, biceps, triceps, 0-tr wrist and HI. RLE 2-3/5 and inconsistent. LUE and LLE 4-5/5. Ambulated with therapy with reasonable awareness of  balance/space. Followed directions well.   Psychiatric: She has a normal mood and affect. Her behavior is normal. Thought content normal.    LabResultsLast24Hours       Results for orders placed or performed during the hospital encounter of 07/09/18 (from the past 24 hour(s))  Glucose, capillary     Status: Abnormal   Collection Time: 07/11/18 12:05 PM  Result Value Ref Range   Glucose-Capillary 184 (H) 70 - 99 mg/dL  Glucose, capillary     Status: Abnormal   Collection Time: 07/11/18  4:45 PM  Result Value Ref Range   Glucose-Capillary 263 (H) 70 - 99 mg/dL  Glucose, capillary     Status: Abnormal   Collection Time: 07/11/18  9:42 PM  Result Value Ref Range   Glucose-Capillary 206 (H) 70 - 99 mg/dL  Glucose, capillary     Status: Abnormal   Collection Time: 07/12/18  8:08 AM  Result Value Ref Range   Glucose-Capillary 129 (H) 70 - 99 mg/dL      [id number])  Ct Angio Head W Or Wo Contrast  Result Date: 07/10/2018 CLINICAL DATA:  Right arm weakness and right leg weakness. Symptoms began about 3 days ago. Abnormal brainstem by CT. EXAM: CT ANGIOGRAPHY HEAD AND NECK TECHNIQUE: Multidetector CT imaging of the head and neck was performed using the standard protocol during bolus administration of intravenous contrast. Multiplanar CT image reconstructions and MIPs were obtained to evaluate the vascular anatomy. Carotid stenosis measurements (when applicable) are obtained utilizing NASCET criteria, using the distal internal carotid diameter as the denominator. CONTRAST:  50mL ISOVUE-370 IOPAMIDOL (ISOVUE-370) INJECTION 76% COMPARISON:  CT done yesterday. FINDINGS: CTA NECK FINDINGS Aortic arch: The aortic arch appears normal. Branching pattern of the brachiocephalic vessels is normal without origin stenosis. Right carotid  system: Common carotid artery is tortuous but widely patent to the bifurcation. The carotid bifurcation does not show any plaque, stenosis  or irregularity. Cervical ICA is tortuous but widely patent. Left carotid system: Left common carotid artery is widely patent to the bifurcation. No carotid bifurcation atherosclerotic disease. Cervical ICA is tortuous but widely patent. Vertebral arteries: The right vertebral artery is dominant. Both vertebral artery origins are widely patent. Both vertebral arteries appear normal through the cervical region to the foramen magnum. Skeleton: Ordinary mid cervical spondylosis. Other neck: No soft tissue mass or lymphadenopathy. Upper chest: Negative Review of the MIP images confirms the above findings CTA HEAD FINDINGS Anterior circulation: There is atherosclerotic calcification in both carotid siphon regions but no stenosis more than about 20 or 30%. The anterior and middle cerebral vessels are patent without proximal stenosis, aneurysm or vascular malformation. Distal branch vessels appear unremarkable. Posterior circulation: Both vertebral arteries are patent through the foramen magnum to the basilar. Dominant supply is from the right. Posterior inferior cerebellar arteries are patent. The basilar artery shows mild atherosclerotic irregularity but does not have a flow limiting stenosis. Superior cerebellar and posterior cerebral arteries appear normal. Right PCA takes fetal origin. Venous sinuses: Patent and normal. Anatomic variants: None significant. Delayed phase: No abnormal enhancement. Review of the MIP images confirms the above findings IMPRESSION: Carotid bifurcations appear normal. No anterior circulation disease of significance. Tortuous vessels suggesting a history of hypertension. Mild atherosclerotic irregularity of the basilar artery but without flow limiting stenosis. Electronically Signed   By: Paulina FusiMark  Shogry M.D.   On: 07/10/2018 14:12   Ct Angio Neck W Or Wo Contrast  Result Date: 07/10/2018 CLINICAL DATA:  Right arm weakness and right leg weakness. Symptoms began about 3 days ago. Abnormal  brainstem by CT. EXAM: CT ANGIOGRAPHY HEAD AND NECK TECHNIQUE: Multidetector CT imaging of the head and neck was performed using the standard protocol during bolus administration of intravenous contrast. Multiplanar CT image reconstructions and MIPs were obtained to evaluate the vascular anatomy. Carotid stenosis measurements (when applicable) are obtained utilizing NASCET criteria, using the distal internal carotid diameter as the denominator. CONTRAST:  50mL ISOVUE-370 IOPAMIDOL (ISOVUE-370) INJECTION 76% COMPARISON:  CT done yesterday. FINDINGS: CTA NECK FINDINGS Aortic arch: The aortic arch appears normal. Branching pattern of the brachiocephalic vessels is normal without origin stenosis. Right carotid system: Common carotid artery is tortuous but widely patent to the bifurcation. The carotid bifurcation does not show any plaque, stenosis or irregularity. Cervical ICA is tortuous but widely patent. Left carotid system: Left common carotid artery is widely patent to the bifurcation. No carotid bifurcation atherosclerotic disease. Cervical ICA is tortuous but widely patent. Vertebral arteries: The right vertebral artery is dominant. Both vertebral artery origins are widely patent. Both vertebral arteries appear normal through the cervical region to the foramen magnum. Skeleton: Ordinary mid cervical spondylosis. Other neck: No soft tissue mass or lymphadenopathy. Upper chest: Negative Review of the MIP images confirms the above findings CTA HEAD FINDINGS Anterior circulation: There is atherosclerotic calcification in both carotid siphon regions but no stenosis more than about 20 or 30%. The anterior and middle cerebral vessels are patent without proximal stenosis, aneurysm or vascular malformation. Distal branch vessels appear unremarkable. Posterior circulation: Both vertebral arteries are patent through the foramen magnum to the basilar. Dominant supply is from the right. Posterior inferior cerebellar arteries  are patent. The basilar artery shows mild atherosclerotic irregularity but does not have a flow limiting stenosis. Superior cerebellar and posterior  cerebral arteries appear normal. Right PCA takes fetal origin. Venous sinuses: Patent and normal. Anatomic variants: None significant. Delayed phase: No abnormal enhancement. Review of the MIP images confirms the above findings IMPRESSION: Carotid bifurcations appear normal. No anterior circulation disease of significance. Tortuous vessels suggesting a history of hypertension. Mild atherosclerotic irregularity of the basilar artery but without flow limiting stenosis. Electronically Signed   By: Paulina Fusi M.D.   On: 07/10/2018 14:12   Mr Brain Wo Contrast  Result Date: 07/10/2018 CLINICAL DATA:  Right arm and leg weakness, acute onset. Brainstem stroke. EXAM: MRI HEAD WITHOUT CONTRAST TECHNIQUE: Multiplanar, multiecho pulse sequences of the brain and surrounding structures were obtained without intravenous contrast. COMPARISON:  CT angiography same day.  CT head yesterday. FINDINGS: Brain: Diffusion imaging shows a 1 x 2 cm acute infarction in the left para median pons. No other acute infarction. No old brainstem insult is identified. There is an old right cerebellar infarction. Cerebral hemispheres show an old lacunar infarction in the right caudate head and mild small vessel change of the hemispheric white matter. No large vessel territory infarction. No mass lesion, hemorrhage, hydrocephalus or extra-axial collection. Vascular: Major vessels at the base of the brain show flow. Skull and upper cervical spine: Negative Sinuses/Orbits: Clear/normal Other: None IMPRESSION: 1 x 2 cm acute infarction in the left para median pons. No evidence of hemorrhage or mass effect. Mild chronic small-vessel change elsewhere as above. Electronically Signed   By: Paulina Fusi M.D.   On: 07/10/2018 14:24      Assessment/Plan: Diagnosis: left pontine infarct 1. Does the  need for close, 24 hr/day medical supervision in concert with the patient's rehab needs make it unreasonable for this patient to be served in a less intensive setting? Yes 2. Co-Morbidities requiring supervision/potential complications: HTN, post-stroke sequelae 3. Due to bladder management, bowel management, safety, skin/wound care, disease management, medication administration, pain management and patient education, does the patient require 24 hr/day rehab nursing? Yes 4. Does the patient require coordinated care of a physician, rehab nurse, PT (1-2 hrs/day, 5 days/week) and OT (1-2 hrs/day, 5 days/week) , probably not SLP, to address physical and functional deficits in the context of the above medical diagnosis(es)? Yes Addressing deficits in the following areas: balance, endurance, locomotion, strength, transferring, bowel/bladder control, bathing, dressing, feeding, grooming, toileting and psychosocial support 5. Can the patient actively participate in an intensive therapy program of at least 3 hrs of therapy per day at least 5 days per week? Yes 6. The potential for patient to make measurable gains while on inpatient rehab is excellent 7. Anticipated functional outcomes upon discharge from inpatient rehab are modified independent  with PT, modified independent and supervision with OT, n/a with SLP. 8. Estimated rehab length of stay to reach the above functional goals is: 7-12 days 9. Anticipated D/C setting: Home 10. Anticipated post D/C treatments: HH therapy and Outpatient therapy 11. Overall Rehab/Functional Prognosis: excellent  RECOMMENDATIONS: This patient's condition is appropriate for continued rehabilitative care in the following setting: CIR Patient has agreed to participate in recommended program. Yes and Potentially Note that insurance prior authorization may be required for reimbursement for recommended care.  Comment: Rehab Admissions Coordinator to follow  up.  Thanks,  Ranelle Oyster, MD, [place] [facility]  I have personally performed a face to face diagnostic evaluation of this patient. Additionally, I have reviewed and concur with the physician assistant's documentation above.    Jacquelynn Cree, PA-C 07/12/2018  Revision History                             Routing History

## 2018-07-14 ENCOUNTER — Inpatient Hospital Stay (HOSPITAL_COMMUNITY): Payer: Medicare Other

## 2018-07-14 ENCOUNTER — Inpatient Hospital Stay (HOSPITAL_COMMUNITY): Payer: Medicare Other | Admitting: Physical Therapy

## 2018-07-14 DIAGNOSIS — I69351 Hemiplegia and hemiparesis following cerebral infarction affecting right dominant side: Secondary | ICD-10-CM

## 2018-07-14 LAB — CBC WITH DIFFERENTIAL/PLATELET
Abs Immature Granulocytes: 0 10*3/uL (ref 0.0–0.1)
BASOS ABS: 0.1 10*3/uL (ref 0.0–0.1)
Basophils Relative: 1 %
Eosinophils Absolute: 0.3 10*3/uL (ref 0.0–0.7)
Eosinophils Relative: 3 %
HCT: 41.2 % (ref 36.0–46.0)
Hemoglobin: 12 g/dL (ref 12.0–15.0)
Immature Granulocytes: 0 %
LYMPHS PCT: 21 %
Lymphs Abs: 2.5 10*3/uL (ref 0.7–4.0)
MCH: 22.3 pg — ABNORMAL LOW (ref 26.0–34.0)
MCHC: 29.1 g/dL — ABNORMAL LOW (ref 30.0–36.0)
MCV: 76.7 fL — ABNORMAL LOW (ref 78.0–100.0)
Monocytes Absolute: 0.8 10*3/uL (ref 0.1–1.0)
Monocytes Relative: 7 %
NEUTROS ABS: 8.3 10*3/uL — AB (ref 1.7–7.7)
Neutrophils Relative %: 68 %
Platelets: 207 10*3/uL (ref 150–400)
RBC: 5.37 MIL/uL — AB (ref 3.87–5.11)
RDW: 13.4 % (ref 11.5–15.5)
WBC: 12 10*3/uL — ABNORMAL HIGH (ref 4.0–10.5)

## 2018-07-14 LAB — GLUCOSE, CAPILLARY
GLUCOSE-CAPILLARY: 173 mg/dL — AB (ref 70–99)
GLUCOSE-CAPILLARY: 144 mg/dL — AB (ref 70–99)
Glucose-Capillary: 192 mg/dL — ABNORMAL HIGH (ref 70–99)
Glucose-Capillary: 172 mg/dL — ABNORMAL HIGH (ref 70–99)

## 2018-07-14 LAB — COMPREHENSIVE METABOLIC PANEL
ALT: 31 U/L (ref 0–44)
AST: 31 U/L (ref 15–41)
Albumin: 3.2 g/dL — ABNORMAL LOW (ref 3.5–5.0)
Alkaline Phosphatase: 80 U/L (ref 38–126)
Anion gap: 7 (ref 5–15)
BUN: 11 mg/dL (ref 8–23)
CO2: 27 mmol/L (ref 22–32)
Calcium: 9.2 mg/dL (ref 8.9–10.3)
Chloride: 106 mmol/L (ref 98–111)
Creatinine, Ser: 0.9 mg/dL (ref 0.44–1.00)
GFR calc Af Amer: >60 mL/min (ref >60)
GFR calc non Af Amer: >60 mL/min (ref >60)
Glucose, Bld: 179 mg/dL — ABNORMAL HIGH (ref 70–99)
Potassium: 4.2 mmol/L (ref 3.5–5.1)
Sodium: 140 mmol/L (ref 135–145)
Total Bilirubin: 0.9 mg/dL (ref 0.3–1.2)
Total Protein: 6.2 g/dL — ABNORMAL LOW (ref 6.5–8.1)

## 2018-07-14 MED ORDER — GLIMEPIRIDE 2 MG PO TABS
1.0000 mg | ORAL_TABLET | Freq: Every day | ORAL | Status: DC
  Administered 2018-07-14 – 2018-07-25 (×12): 1 mg via ORAL
  Filled 2018-07-14 (×12): qty 1

## 2018-07-14 MED ORDER — GLIMEPIRIDE 2 MG PO TABS: 2.0000 mg | ORAL_TABLET | Freq: Every day | ORAL | Status: DC

## 2018-07-14 NOTE — Evaluation (Signed)
Occupational Therapy Assessment and Plan  Patient Details  Name: Samantha Paul MRN: [id number] Date of Birth: 1946-09-26  OT Diagnosis: hemiplegia affecting dominant side Rehab Potential:  good ELOS: 10-14 days   Today's Date: 07/14/2018 OT Individual Time: 4098-1191 Session 2: 1305-1400 OT Individual Time Calculation (min): 60 min   Session 2: 55 min  Problem List:  Patient Active Problem List   Diagnosis Date Noted  . Left pontine stroke ([place]) 07/13/2018  . Essential hypertension   . Diabetes mellitus type 2 in nonobese (HCC)   . CVA (cerebral vascular accident) ([place]) 07/10/2018  . Hypertensive urgency 07/10/2018  . Hyperglycemia 07/10/2018  . Right sided weakness 07/10/2018  . Hyperlipidemia     Past Medical History:  Past Medical History:  Diagnosis Date  . Back pain   . HTN (hypertension)   . Knee pain    Past Surgical History: History reviewed. No pertinent surgical history.  Assessment & Plan Clinical Impression: Samantha Paul is a 72 year old female with history of untreated HTN who was admitted on07/15/2019 with 2-day history of right-sided weakness, slurred speech and difficulty walking. Blood pressure at admission was 193/89 and MRI brain done revealing 1 x 2 cm acute left paramedian pontine infarct. CT head neck was negative for flow-limiting stenosis. 2D echo showed EF of 65 to 70% with moderate focal basal hypertrophy and moderate proximal septal thickening with findings consistent suggestive of HOCM. Dr. Rigoberto Noel felt that stroke was due to small vessel disease and recommended aggressive stroke risk factors management. She is to continue DAPT x3 weeks followed by aspirin or Plavix alone. Patient with new diagnosis of diabetes with hemoglobin A1c of 12.3 and she was started on Lantus insulin. Currently patient continues to be limited by right-sided weakness with balance deficits. CIR was recommended due to functional deficits.    Patient transferred to CIR on 07/13/2018 .     Patient currently requires mod with basic self-care skills secondary to muscle weakness and muscle paralysis, decreased cardiorespiratoy endurance, impaired timing and sequencing, abnormal tone, ataxia, decreased coordination and decreased motor planning, decreased attention to right, decreased awareness, decreased problem solving and decreased safety awareness and decreased standing balance, decreased postural control and hemiplegia.  Prior to hospitalization, patient could complete ADLs with independent .  Patient will benefit from skilled intervention to decrease level of assist with basic self-care skills and increase level of independence with iADL prior to discharge home with care partner.  Anticipate patient will require intermittent supervision and follow up home health.  OT - End of Session Activity Tolerance: Tolerates 10 - 20 min activity with multiple rests Endurance Deficit: Yes Endurance Deficit Description: decreased, required seated rest breaks w/ all mobility  OT Assessment OT Patient demonstrates impairments in the following area(s): Balance;Endurance;Cognition;Safety;Motor;Edema OT Basic ADL's Functional Problem(s): Eating;Grooming;Bathing;Dressing;Toileting OT Transfers Functional Problem(s): Toilet;Tub/Shower OT Additional Impairment(s): Fuctional Use of Upper Extremity OT Plan OT Intensity: Minimum of 1-2 x/day, 45 to 90 minutes OT Frequency: 5 out of 7 days OT Duration/Estimated Length of Stay: 10-14 days OT Treatment/Interventions: Balance/vestibular training;Cognitive remediation/compensation;Discharge planning;DME/adaptive equipment instruction;Functional mobility training;Pain management;Psychosocial support;Therapeutic Activities;UE/LE Strength taining/ROM;Wheelchair propulsion/positioning;UE/LE Coordination activities;Therapeutic Exercise;Splinting/orthotics;Patient/family education;Neuromuscular re-education;Functional electrical stimulation;Disease  mangement/prevention;Community reintegration;Self Care/advanced ADL retraining OT Self Feeding Anticipated Outcome(s): set up OT Basic Self-Care Anticipated Outcome(s): (S) OT Toileting Anticipated Outcome(s): (S) OT Bathroom Transfers Anticipated Outcome(s): (S) OT Recommendation Recommendations for Other Services: Speech consult Patient destination: Home Follow Up Recommendations: Home health OT Equipment Recommended: Tub/shower bench   Skilled Therapeutic Intervention Session  1: Skilled OT eval completed. Interpretor and pt spouse present throughout session. Discussed with pt and spouse OT POC, ELOS, recommended AD/AE use, and condition insight. Pt declined b/d d/t being cold this session, so dressing tasks were simulated with theraband. Demo provided re hemi dressing UB/LB techniques with pt returning demo. Pt completed 10 ft of functional mobility with quad cane into bathroom, with min-mod A required throughout. Pt completed simulated toilet transfer with min A. Demo and explanation re TTB use and indication provided. Pt returned demo of TTB with min A overall. Pt completed several gravity eliminated R elbow flex/ext exercises side lying on mat. Pt encouraged to continue completing these exercises when in room. Practiced pt's husband performing stand pivot transfers to [facility], with recommendation made that pt still wait for nursing rather than allow husband to transfer as of now. Pt returned to room and left sitting up in w/c with all needs met and chair alarm belt engaged.   Session 2: Pt received sitting up in w/c with family present. Pt completed 100 ft of functional mobility using quad cane with 4 major LOB, requiring mod A to correct/maintain upright posture. Pt completed standing level functional reaching task, with an emphasis on forcing weight shift over R LE during reaching. Min A required to stabilize standing balance throughout task. Mod A provided during standing and sitting level  functional stepping activity in order to simulate stepping over doorway thresholds in home. Pt then completed 8 min on the NuStep, level 5 resistance, in order to increase functional use of R UE and increase functional activity tolerance by challenging cardiorespiratory endurance. Pt returned to room and left sitting up in w/c with all needs met and chair alarm belt set.   OT Evaluation Precautions/Restrictions  Precautions Precautions: Fall Precaution Comments: right hemiparesis, R inattention Restrictions Weight Bearing Restrictions: No General Chart Reviewed: Yes PT Missed Treatment Reason: Other (Comment)(eating breakfast) Response to Previous Treatment: Patient with no complaints from previous session Family/Caregiver Present: Yes  Pain Pain Assessment Pain Scale: 0-10 Pain Score: 0-No pain Home Living/Prior Functioning Home Living Family/patient expects to be discharged to:: Private residence Living Arrangements: Spouse/significant other Available Help at Discharge: Family, Available 24 hours/day Type of Home: House Home Access: Stairs to enter Technical brewer of Steps: 3 Entrance Stairs-Rails: Can reach both Home Layout: One level Bathroom Shower/Tub: Chiropodist: Standard  Lives With: Spouse, Son IADL History Homemaking Responsibilities: Yes Current License: No Prior Function Level of Independence: Independent with basic ADLs, Independent with transfers, Independent with homemaking with ambulation, Independent with gait  Able to Take Stairs?: Yes Driving: No(husband drives) Vocation: Retired ADL ADL ADL Comments: See functional navigator Vision Baseline Vision/History: Wears glasses Wears Glasses: Reading only Patient Visual Report: No change from baseline Vision Assessment?: No apparent visual deficits Perception  Perception: Impaired Inattention/Neglect: Does not attend to right visual field;Does not attend to right side of  body Praxis Praxis: Intact Cognition Overall Cognitive Status: Difficult to assess Arousal/Alertness: Awake/alert Orientation Level: (unable to acurately assess, discrepency between interpretor and pt) Memory: Impaired Memory Impairment: Decreased recall of new information Awareness: Impaired Awareness Impairment: Emergent impairment Problem Solving: Impaired Problem Solving Impairment: Functional basic Executive Function: Self Monitoring Self Monitoring: Impaired Self Monitoring Impairment: Verbal complex Behaviors: Impulsive Safety/Judgment: Impaired Comments: Mildly impulsive Sensation Sensation Light Touch: Appears Intact Coordination Gross Motor Movements are Fluid and Coordinated: Yes Fine Motor Movements are Fluid and Coordinated: Yes Motor  Motor Motor: Hemiplegia Motor - Skilled Clinical Observations: R hemi  Mobility  Bed Mobility Bed Mobility: Rolling Right;Rolling Left;Supine to Sit;Sit to Supine Rolling Right: Supervision/verbal cueing Rolling Left: Supervision/Verbal cueing Supine to Sit: Moderate Assistance - Patient 50-74% Sit to Supine: Minimal Assistance - Patient > 75% Transfers Sit to Stand: Minimal Assistance - Patient > 75% Stand to Sit: Minimal Assistance - Patient > 75%  Trunk/Postural Assessment  Cervical Assessment Cervical Assessment: Within Functional Limits Thoracic Assessment Thoracic Assessment: Within Functional Limits Lumbar Assessment Lumbar Assessment: Within Functional Limits Postural Control Postural Control: Within Functional Limits  Balance Balance Balance Assessed: Yes Static Sitting Balance Static Sitting - Balance Support: No upper extremity supported;Feet supported Static Sitting - Level of Assistance: 5: Stand by assistance Dynamic Sitting Balance Dynamic Sitting - Balance Support: No upper extremity supported;Feet supported Dynamic Sitting - Level of Assistance: 5: Stand by assistance Dynamic Sitting - Balance  Activities: Reaching for objects Static Standing Balance Static Standing - Balance Support: No upper extremity supported;During functional activity Static Standing - Level of Assistance: 4: Min assist Dynamic Standing Balance Dynamic Standing - Balance Support: No upper extremity supported;During functional activity Dynamic Standing - Level of Assistance: 4: Min assist Dynamic Standing - Balance Activities: Reaching for objects Extremity/Trunk Assessment RUE Assessment RUE Assessment: Exceptions to [facility] RUE Body System: Neuro Brunstrum levels for arm and hand: Hand;Arm Brunstrum level for arm: Stage II Synergy is developing Brunstrum level for hand: Stage I Flaccidity LUE Assessment LUE Assessment: Within Functional Limits   See Function Navigator for Current Functional Status.   Refer to Care Plan for Long Term Goals  Recommendations for other services: None    Discharge Criteria: Patient will be discharged from OT if patient refuses treatment 3 consecutive times without medical reason, if treatment goals not met, if there is a change in medical status, if patient makes no progress towards goals or if patient is discharged from hospital.  The above assessment, treatment plan, treatment alternatives and goals were discussed and mutually agreed upon: by patient and by family  Curtis Sites 07/14/2018, 12:29 PM

## 2018-07-14 NOTE — Evaluation (Signed)
Physical Therapy Assessment and Plan  Patient Details  Name: Samantha Paul MRN: [id number] Date of Birth: [birth year]  PT Diagnosis: Abnormality of gait, Difficulty walking, Hemiplegia and Muscle weakness Rehab Potential: Excellent ELOS: 7-10 days   Today's Date: 07/14/2018 PT Individual Time: 8127-5170 PT Individual Time Calculation (min): 55 min    Problem List:  Patient Active Problem List   Diagnosis Date Noted  . Left pontine stroke ([place]) 07/13/2018  . Essential hypertension   . Diabetes mellitus type 2 in nonobese (HCC)   . CVA (cerebral vascular accident) ([place]) 07/10/2018  . Hypertensive urgency 07/10/2018  . Hyperglycemia 07/10/2018  . Right sided weakness 07/10/2018  . Hyperlipidemia     Past Medical History:  Past Medical History:  Diagnosis Date  . Back pain   . HTN (hypertension)   . Knee pain    Past Surgical History: History reviewed. No pertinent surgical history.  Assessment & Plan Clinical Impression: Patient is a 72 year old female with history of untreated HTN who was admitted on07/15/2019 with 2-day history of right-sided weakness, slurred speech and difficulty walking. Blood pressure at admission was 193/89 and MRI brain done revealing 1 x 2 cm acute left paramedian pontine infarct. CT head neck was negative for flow-limiting stenosis. 2D echo showed EF of 65 to 70% with moderate focal basal hypertrophy and moderate proximal septal thickening with findings consistent suggestive of HOCM. Dr. Rigoberto Noel felt that stroke was due to small vessel disease and recommended aggressive stroke risk factors management. She is to continue DAPT x3 weeks followed by aspirin or Plavix alone. Patient with new diagnosis of diabetes with hemoglobin A1c of 12.3 and she was started on Lantus insulin. Currently patient continues to be limited by right-sided weakness with balance deficits. CIR was recommended due to functional deficits. Patient transferred to CIR on 07/13/2018 .    Patient currently requires min with mobility secondary to muscle weakness, decreased cardiorespiratoy endurance, unbalanced muscle activation and decreased motor planning, decreased attention to right and decreased standing balance, hemiplegia and decreased balance strategies.  Prior to hospitalization, patient was independent  with mobility and lived with Spouse, Son in a House home.  Home access is 3Stairs to enter.  Patient will benefit from skilled PT intervention to maximize safe functional mobility, minimize fall risk and decrease caregiver burden for planned discharge home with 24 hour supervision.  Anticipate patient will benefit from follow up [place] at discharge.  PT - End of Session Activity Tolerance: Tolerates < 10 min activity, no significant change in vital signs Endurance Deficit: Yes Endurance Deficit Description: decreased, required seated rest breaks w/ all mobility  PT Assessment Rehab Potential (ACUTE/IP ONLY): Excellent PT Barriers to Discharge: Inaccessible home environment PT Barriers to Discharge Comments: 3-4 steps to enter home PT Patient demonstrates impairments in the following area(s): Balance;Endurance;Motor;Perception;Safety PT Transfers Functional Problem(s): Bed Mobility;Bed to Chair;Car;Furniture;Floor PT Locomotion Functional Problem(s): Ambulation;Wheelchair Mobility;Stairs PT Plan PT Intensity: Minimum of 1-2 x/day ,45 to 90 minutes PT Frequency: 5 out of 7 days PT Duration Estimated Length of Stay: 10-14 days PT Treatment/Interventions: Ambulation/gait training;Disease management/prevention;Pain management;Stair training;Visual/perceptual remediation/compensation;Wheelchair propulsion/positioning;Therapeutic Activities;Patient/family education;DME/adaptive equipment instruction;Balance/vestibular training;Cognitive remediation/compensation;Functional electrical stimulation;Psychosocial support;Therapeutic Exercise;UE/LE Strength taining/ROM;Skin care/wound  management;Functional mobility training;Community reintegration;Discharge planning;Neuromuscular re-education;Splinting/orthotics;UE/LE Coordination activities PT Transfers Anticipated Outcome(s): Mod I  PT Locomotion Anticipated Outcome(s): Supervision household gait PT Recommendation Follow Up Recommendations: Home health PT Patient destination: Home Equipment Recommended: To be determined  Skilled Therapeutic Intervention  Pt in supine and agreeable to therapy, motioning to eat  breakfast first prior to therapy. Missed 20 min of skilled PT 2/2 breakfast. Interpreter present upon return, husband able to speak some [place]. Pt and husband instructed patient in PT Evaluation and initiated treatment intervention; see below for results. Pt educated patient in [place], rehab potential, rehab goals, and discharge recommendations. Per nursing, pt's family had been carrying her to the bathroom last night. Educated husband on importance of waiting for staff to assist for both his safety and the pt's safety. Husband verbalized understanding and in agreement. Deferred checking husband off for transfers at this time as he has a broken LUE and RUE gout flare up.   PT Evaluation Precautions/Restrictions Precautions Precautions: Fall Restrictions Weight Bearing Restrictions: No Pain Pain Assessment Pain Scale: 0-10 Pain Score: 0-No pain Home Living/Prior Functioning Home Living Available Help at Discharge: Family;Available 24 hours/day Type of Home: House Home Access: Stairs to enter [employer] of Steps: 3 Entrance Stairs-Rails: Can reach both Home Layout: One level Bathroom Shower/Tub: Chiropodist: Standard  Lives With: Spouse;Son Prior Function Level of Independence: Independent with basic ADLs;Independent with transfers;Independent with homemaking with ambulation;Independent with gait  Able to Take Stairs?: Yes Driving: No(Husband does all the driving) Vocation:  Retired Art gallery manager: Impaired Inattention/Neglect: Does not attend to right visual field(mild R inattention) Praxis Praxis: Intact  Cognition Overall Cognitive Status: Within Functional Limits for tasks assessed Arousal/Alertness: Awake/alert Orientation Level: Oriented X4 Memory: Appears intact Awareness: Appears intact Problem Solving: Appears intact Behaviors: Impulsive Safety/Judgment: Impaired Comments: Mildly impulsive Sensation Sensation Light Touch: Appears Intact Coordination Gross Motor Movements are Fluid and Coordinated: Yes Fine Motor Movements are Fluid and Coordinated: Yes Motor  Motor Motor: Hemiplegia Motor - Skilled Clinical Observations: Mild R hemi  Mobility Bed Mobility Bed Mobility: Rolling Right;Rolling Left;Supine to Sit;Sit to Supine Rolling Right: Supervision/verbal cueing Rolling Left: Supervision/Verbal cueing Supine to Sit: Moderate Assistance - Patient 50-74% Sit to Supine: Minimal Assistance - Patient > 75% Transfers Transfers: Stand Pivot Transfers;Stand to Sit;Sit to Stand Sit to Stand: Contact Guard/Touching assist Stand to Sit: Contact Guard/Touching assist Stand Pivot Transfers: Minimal Assistance - Patient > 75% Stand Pivot Transfer Details: Manual facilitation for placement;Manual facilitation for weight shifting;Verbal cues for technique;Verbal cues for safe use of DME/AE;Manual facilitation for weight bearing;Tactile cues for placement;Tactile cues for weight shifting;Tactile cues for posture Transfer (Assistive device): 1 person hand held assist Locomotion  Gait Ambulation: Yes Gait Assistance: Minimal Assistance - Patient > 75% Gait Distance (Feet): 75 Feet Assistive device: Small based quad cane Gait Assistance Details: Manual facilitation for weight bearing;Manual facilitation for weight shifting;Manual facilitation for placement;Tactile cues for posture;Tactile cues for weight shifting;Tactile  cues for placement Gait Gait: Yes Gait Pattern: Impaired Gait Pattern: Step-to pattern;Poor foot clearance - right;Decreased stance time - left;Decreased dorsiflexion - right;Decreased weight shift to left Gait velocity: decreased Stairs / Additional Locomotion Stairs: Yes Stairs Assistance: Minimal Assistance - Patient > 75% Stair Management Technique: One rail Left Number of Stairs: 4 Height of Stairs: 6 Wheelchair Mobility Wheelchair Mobility: No  Trunk/Postural Assessment  Cervical Assessment Cervical Assessment: Within Functional Limits Thoracic Assessment Thoracic Assessment: Within Functional Limits Lumbar Assessment Lumbar Assessment: Within Functional Limits Postural Control Postural Control: Within Functional Limits  Balance Balance Balance Assessed: Yes Static Sitting Balance Static Sitting - Balance Support: No upper extremity supported;Feet supported Static Sitting - Level of Assistance: 5: Stand by assistance Dynamic Sitting Balance Dynamic Sitting - Balance Support: No upper extremity supported;Feet supported Dynamic Sitting - Level of Assistance: 5:  Stand by assistance Static Standing Balance Static Standing - Balance Support: No upper extremity supported;During functional activity Static Standing - Level of Assistance: 4: Min assist Dynamic Standing Balance Dynamic Standing - Balance Support: No upper extremity supported;During functional activity Dynamic Standing - Level of Assistance: 4: Min assist Extremity Assessment  RLE Assessment RLE Assessment: Exceptions to [facility] Passive Range of Motion (PROM) Comments: WFL General Strength Comments: Difficult to assess 2/2 language barrier, able to move extremity against gravity, 3/5 hip and knee musculature, 2/5 DF/PF LLE Assessment LLE Assessment: Within Functional Limits   See Function Navigator for Current Functional Status.   Refer to Care Plan for Long Term Goals  Recommendations for other services:  None   Discharge Criteria: Patient will be discharged from PT if patient refuses treatment 3 consecutive times without medical reason, if treatment goals not met, if there is a change in medical status, if patient makes no progress towards goals or if patient is discharged from hospital.  The above assessment, treatment plan, treatment alternatives and goals were discussed and mutually agreed upon: by patient and by family  Rami Budhu K Arnette 07/14/2018, 8:46 AM

## 2018-07-14 NOTE — Progress Notes (Signed)
Subjective/Complaints:   Objective: Vital Signs: Blood pressure (!) 151/72, pulse 68, temperature 98.2 F (36.8 C), temperature source Oral, resp. rate 17, height 4' 10"  (1.473 m), weight 56.4 kg (124 lb 4.8 oz), SpO2 99 %. No results found. Results for orders placed or performed during the hospital encounter of 07/13/18 (from the past 72 hour(s))  Glucose, capillary     Status: Abnormal   Collection Time: 07/13/18  4:53 PM  Result Value Ref Range   Glucose-Capillary 308 (H) 70 - 99 mg/dL  Glucose, capillary     Status: None   Collection Time: 07/13/18 10:01 PM  Result Value Ref Range   Glucose-Capillary 94 70 - 99 mg/dL  CBC WITH DIFFERENTIAL     Status: Abnormal   Collection Time: 07/14/18  6:57 AM  Result Value Ref Range   WBC 12.0 (H) 4.0 - 10.5 K/uL    Comment: CORRECTED ON 07/20 AT 0742: PREVIOUSLY REPORTED AS 12.1   RBC 5.37 (H) 3.87 - 5.11 MIL/uL    Comment: CORRECTED ON 07/20 AT 0742: PREVIOUSLY REPORTED AS 5.46   Hemoglobin 12.0 12.0 - 15.0 g/dL   HCT 41.2 36.0 - 46.0 %    Comment: CORRECTED ON 07/20 AT 0742: PREVIOUSLY REPORTED AS 41.6   MCV 76.7 (L) 78.0 - 100.0 fL    Comment: CORRECTED ON 07/20 AT 0742: PREVIOUSLY REPORTED AS 76.2   MCH 22.3 (L) 26.0 - 34.0 pg    Comment: CORRECTED ON 07/20 AT 0742: PREVIOUSLY REPORTED AS 22.0   MCHC 29.1 (L) 30.0 - 36.0 g/dL    Comment: CORRECTED ON 07/20 AT 0742: PREVIOUSLY REPORTED AS 28.8   RDW 13.4 11.5 - 15.5 %   Platelets 207 150 - 400 K/uL    Comment: CORRECTED ON 07/20 AT 0742: PREVIOUSLY REPORTED AS 180   Neutrophils Relative % 68 %    Comment: CORRECTED ON 07/20 AT 0742: PREVIOUSLY REPORTED AS 69   Neutro Abs 8.3 (H) 1.7 - 7.7 K/uL    Comment: CORRECTED ON 07/20 AT 0742: PREVIOUSLY REPORTED AS 8.4   Lymphocytes Relative 21 %   Lymphs Abs 2.5 0.7 - 4.0 K/uL   Monocytes Relative 7 %    Comment: CORRECTED ON 07/20 AT 0742: PREVIOUSLY REPORTED AS 6   Monocytes Absolute 0.8 0.1 - 1.0 K/uL    Comment: CORRECTED ON 07/20  AT 0742: PREVIOUSLY REPORTED AS 0.7   Eosinophils Relative 3 %   Eosinophils Absolute 0.3 0.0 - 0.7 K/uL   Basophils Relative 1 %   Basophils Absolute 0.1 0.0 - 0.1 K/uL   Immature Granulocytes 0 %   Abs Immature Granulocytes 0.0 0.0 - 0.1 K/uL    Comment: Performed at [facility], [place]. [address], [place], [place] [address] CORRECTED ON 07/20 AT 0932: PREVIOUSLY REPORTED AS 0.1   Comprehensive metabolic panel     Status: Abnormal   Collection Time: 07/14/18  6:57 AM  Result Value Ref Range   Sodium 140 135 - 145 mmol/L   Potassium 4.2 3.5 - 5.1 mmol/L   Chloride 106 98 - 111 mmol/L    Comment: Please note change in reference range.   CO2 27 22 - 32 mmol/L   Glucose, Bld 179 (H) 70 - 99 mg/dL    Comment: Please note change in reference range.   BUN 11 8 - 23 mg/dL    Comment: Please note change in reference range.   Creatinine, Ser 0.90 0.44 - 1.00 mg/dL   Calcium 9.2 8.9 -  10.3 mg/dL   Total Protein 6.2 (L) 6.5 - 8.1 g/dL   Albumin 3.2 (L) 3.5 - 5.0 g/dL   AST 31 15 - 41 U/L   ALT 31 0 - 44 U/L    Comment: Please note change in reference range.   Alkaline Phosphatase 80 38 - 126 U/L   Total Bilirubin 0.9 0.3 - 1.2 mg/dL   GFR calc non Af Amer >60 >60 mL/min   GFR calc Af Amer >60 >60 mL/min    Comment: (NOTE) The eGFR has been calculated using the CKD EPI equation. This calculation has not been validated in all clinical situations. eGFR's persistently <60 mL/min signify possible Chronic Kidney Disease.    Anion gap 7 5 - 15    Comment: Performed at [facility] [address], [place], [place] [address]     HEENT: normal Cardio: RRR and no murmur Resp: CTA B/L and unlabored GI: BS positive and NT, ND Extremity:  Pulses positive and No Edema Skin:   Intact Neuro: Alert/Oriented, Normal Sensory, Abnormal Motor 2- Right grip, bicep and delt, tr triceps, 3/5 R HF KE 2- R ADF  and Abnormal FMC Ataxic/ dec FMC Musc/Skel:  Other no pain with UE or LE ROM Gen  NAD   Assessment/Plan: 1. Functional deficits secondary to Right hemiparesis Left pontine infarct which require 3+ hours per day of interdisciplinary therapy in a comprehensive inpatient rehab setting. Physiatrist is providing close team supervision and 24 hour management of active medical problems listed below. Physiatrist and rehab team continue to assess barriers to discharge/monitor patient progress toward functional and medical goals. FIM:       Function - Toileting Toileting steps completed by helper: Adjust clothing prior to toileting, Performs perineal hygiene, Adjust clothing after toileting Assist level: Touching or steadying assistance (Pt.75%)           Function - Comprehension Comprehension: Auditory Comprehension assist level: Understands basic less than 25% of the time/ requires cueing >75% of the time  Function - Expression Expression: Nonverbal(unable to speak english) Expression assist level: Expresses basis less than 25% of the time/requires cueing >75% of the time.  Function - Social Interaction Social Interaction assist level: Interacts appropriately 25 - 49% of time - Needs frequent redirection.  Function - Problem Solving Problem solving assist level: Solves basic 50 - 74% of the time/requires cueing 25 - 49% of the time  Function - Memory Memory assistive device: Other (Comment) Memory assist level: Recognizes or recalls less than 25% of the time/requires cueing greater than 75% of the time  Medical Problem list and Plan: 1. Functional deficits secondary to left pontine infarct       CIR evals PT, OT           -pt needs a Optometrist for communication- translator with pt today 2. DVT proph with sq lovenox 73m daily 3. HTN: monitor with increased activity           -norvasc, metoprolol, lopressor.            Vitals:   07/13/18 2002 07/14/18 0432  BP: (!) 155/73 (!) 151/72  Pulse: 67 68  Resp: 17 17  Temp: 98.3 F (36.8 C) 98.2 F (36.8 C)   SpO2: 100% 99%  permissive for now, in range of 1311or less systolic  4. Pain mgt: tylenol prn 5. Mood: team to provide ego support as necessary 6. Neuropsych: pt is competent to make decisions on her own behalf 7. Diabetes  type 2: SSI for covg, TID and HS.  CBG (last 3)  Recent Labs    07/13/18 1248 07/13/18 1653 07/13/18 2201  GLUCAP 106* 308* 94  occ spike            -metformin 836m bid- has diarrhea will hold  And start low dose amaryl           -follow for pattern.  8. Stroke prophylaxis with ASA and plavix 9.  Leukocytosis without fever monitor for now   LOS (Days) 1 A FACE TO FACE EVALUATION WAS PERFORMED  ACharlett Blake7/20/2019, 8:56 AM

## 2018-07-15 ENCOUNTER — Inpatient Hospital Stay (HOSPITAL_COMMUNITY): Payer: Medicare Other | Admitting: Occupational Therapy

## 2018-07-15 LAB — GLUCOSE, CAPILLARY
GLUCOSE-CAPILLARY: 165 mg/dL — AB (ref 70–99)
Glucose-Capillary: 204 mg/dL — ABNORMAL HIGH (ref 70–99)
Glucose-Capillary: 95 mg/dL (ref 70–99)
Glucose-Capillary: 179 mg/dL — ABNORMAL HIGH (ref 70–99)

## 2018-07-15 NOTE — IPOC Note (Signed)
Overall Plan of Care [facility](IPOC) Patient Details Name: Samantha Paul MRN: [id number] DOB: 1946-12-01  Admitting Diagnosis: <principal problem not specified>  Hospital Problems: Active Problems:   Left pontine stroke [facility](HCC)   Essential hypertension   Diabetes mellitus type 2 in nonobese [facility](HCC)     Functional Problem List: Nursing Endurance, Medication Management, Motor, Safety  PT Balance, Endurance, Motor, Perception, Safety  OT Balance, Endurance, Cognition, Safety, Motor, Edema  SLP    TR         Basic ADL's: OT Eating, Grooming, Bathing, Dressing, Toileting     Advanced  ADL's: OT       Transfers: PT Bed Mobility, Bed to Chair, Car, State [employer] CorporationFurniture, Civil Service fast [employer]  OT Toilet, Research scientist (life sciences)Tub/Shower     Locomotion: PT Ambulation, Psychologist, prison and probation servicesWheelchair Mobility, Stairs     Additional Impairments: OT Fuctional Use of Upper Extremity  SLP        TR      Anticipated Outcomes Item Anticipated Outcome  Self Feeding set up  Swallowing      Basic self-care  (S)  Toileting  (S)   Bathroom Transfers (S)  Bowel/Bladder  Pt will manage bowel and bladder with supervision assist at discharge   Transfers  Mod I   Locomotion  Supervision household gait  Communication     Cognition     Pain  Pt will manage pain at 3 or less on a scale of 0-10.   Safety/Judgment  Pt will remain free of falls with injury with min assist/cues.    Therapy Plan: PT Intensity: Minimum of 1-2 x/day ,45 to 90 minutes PT Frequency: 5 out of 7 days PT Duration Estimated Length of Stay: 10-14 days OT Intensity: Minimum of 1-2 x/day, 45 to 90 minutes OT Frequency: 5 out of 7 days OT Duration/Estimated Length of Stay: 10-14 days      Team Interventions: Nursing Interventions Patient/Family Education, Pain Management, Cognitive Remediation/Compensation, Disease Management/Prevention, Medication Management, Discharge Planning  PT interventions Ambulation/gait training, Disease management/prevention, Pain management, Stair training,  Visual/perceptual remediation/compensation, Wheelchair propulsion/positioning, Therapeutic Activities, Patient/family education, DME/adaptive equipment instruction, Warden/rangerBalance/vestibular training, Cognitive remediation/compensation, Functional electrical stimulation, Psychosocial support, Therapeutic Exercise, UE/LE Strength taining/ROM, Skin care/wound management, Functional mobility training, Community reintegration, Discharge planning, Neuromuscular re-education, Splinting/orthotics, UE/LE Coordination activities  OT Interventions Warden/rangerBalance/vestibular training, Cognitive remediation/compensation, Discharge planning, DME/adaptive equipment instruction, Functional mobility training, Pain management, Psychosocial support, Therapeutic Activities, UE/LE Strength taining/ROM, Wheelchair propulsion/positioning, UE/LE Coordination activities, Therapeutic Exercise, Splinting/orthotics, Patient/family education, Neuromuscular re-education, Functional electrical stimulation, Disease mangement/prevention, Community reintegration, Self Care/advanced ADL retraining  SLP Interventions    TR Interventions    SW/CM Interventions Discharge Planning, Psychosocial Support, Patient/Family Education   Barriers to Discharge MD  Medical stability  Nursing Medical stability    PT Inaccessible home environment 3-4 steps to enter home  OT      SLP      SW       Team Discharge Planning: Destination: PT-Home ,OT- Home , SLP-  Projected Follow-up: PT-Home health PT, OT-  Home health OT, SLP-  Projected Equipment Needs: PT-To be determined, OT- Tub/shower bench, SLP-  Equipment Details: PT- , OT-  Patient/family involved in discharge planning: PT- Family member/caregiver, Patient,  OT-Patient, Family member/caregiver, SLP-   MD ELOS: 10-14d Medical Rehab Prognosis:  Good Assessment:   72 year old female with history of untreated HTN who was admitted on07/15/2019 with 2-day history of right-sided weakness, slurred speech  and difficulty walking. Blood pressure at admission was 193/89 and MRI brain done revealing 1  x 2 cm acute left paramedian pontine infarct. CT head neck was negative for flow-limiting stenosis. 2D echo showed EF of 65 to 70% with moderate focal basal hypertrophy and moderate proximal septal thickening with findings consistent suggestive of HOCM. Dr. Raynald Kemp felt that stroke was due to small vessel disease and recommended aggressive stroke risk factors management. She is to continue DAPT x3 weeks followed by aspirin or Plavix alone. Patient with new diagnosis of diabetes with hemoglobin A1c of 12.3 and she was started on Lantus insulin     See Team Conference Notes for weekly updates to the plan of care Now requiring 24/7 Rehab RN,MD, as well as CIR level PT, OT and SLP.  Treatment team will focus on ADLs and mobility with goals set at Sup

## 2018-07-15 NOTE — Plan of Care (Signed)
  Problem: Consults Goal: RH STROKE PATIENT EDUCATION Description See Patient Education module for education specifics  Outcome: Progressing Goal: Diabetes Guidelines if Diabetic/Glucose > 140 Description If diabetic or lab glucose is > 140 mg/dl - Initiate Diabetes/Hyperglycemia Guidelines & Document Interventions  Outcome: Progressing   Problem: RH BLADDER ELIMINATION Goal: RH STG MANAGE BLADDER WITH ASSISTANCE Description STG Manage Bladder With Mod I Assistance  Outcome: Progressing   Problem: RH SKIN INTEGRITY Goal: RH STG SKIN FREE OF INFECTION/BREAKDOWN Description No new breakdown with mod I assist   Outcome: Progressing Goal: RH STG MAINTAIN SKIN INTEGRITY WITH ASSISTANCE Description STG Maintain Skin Integrity With Mod I Assistance.  Outcome: Progressing   Problem: RH SAFETY Goal: RH STG ADHERE TO SAFETY PRECAUTIONS W/ASSISTANCE/DEVICE Description STG Adhere to Safety Precautions With Mod I Assistance/Device.  Outcome: Progressing   Problem: RH COGNITION-NURSING Goal: RH STG ANTICIPATES NEEDS/CALLS FOR ASSIST W/ASSIST/CUES Description STG Anticipates Needs/Calls for Assist With Min Assistance/Cues.  Outcome: Progressing

## 2018-07-15 NOTE — Progress Notes (Signed)
Occupational Therapy Session Note  Patient Details  Name: Samantha Paul MRN: [id number] Date of Birth: Oct 09, 1946  Today's Date: 07/15/2018 OT Individual Time: 1305-1400 OT Individual Time Calculation (min): 55 min   Short Term Goals: Week 1:  OT Short Term Goal 1 (Week 1): Pt will require no more than 1 vc for attention to R side of meal tray OT Short Term Goal 2 (Week 1): Pt [facility] OT Short Term Goal 3 (Week 1): Pt will don shirt with min A OT Short Term Goal 4 (Week 1): Pt will thread pants over B LE with min A  Skilled Therapeutic Interventions/Progress Updates:    Pt greeted supine in bed with no c/o pain. Agreeable to shower. Stand pivot<w/c<TTB completed with Mod A to support Rt side. Pt bathed at sit<stand level with OT facilitating R UE weightbearing on grab bar. She exhibited proximal strength when abducting affected limb herself to wash underarm. Mod A overall. She then completed dressing on TTB and w/c level in bathroom per pt preference. Pt educated on hemi techniques with good carryover. Able to thread Rt arm into shirt sleeve herself! Pt able to achieve figure 4 with bilateral LEs with use of TTB to initially prop up limbs. Oral care completed w/c level [facility] for using R UE as gross stabilizer. Min vcs for scanning to Rt for needed items. After completing hair brushing with supervision, pt wanted to remain up in w/c. She was left with family and all needs within reach. Reinforced need to call staff for transferring needs.   Interpretor present during tx   Therapy Documentation Precautions:  Precautions Precautions: Fall Precaution Comments: right hemiparesis, R inattention Restrictions Weight Bearing Restrictions: No Pain: Addressed as written above    ADL: ADL ADL Comments: See functional navigator     See Function Navigator for Current Functional Status.   Therapy/Group: Individual Therapy  Samantha Feldt A  [name] 07/15/2018, 3:54 PM

## 2018-07-15 NOTE — Progress Notes (Signed)
Subjective/Complaints: Son at bedside, he can translate partially.  No issues reported.  Per family able to get to BR more easily yesterday  ROS- limited due to language Objective: Vital Signs: Blood pressure (!) 145/72, pulse 60, temperature 98 F (36.7 C), temperature source Oral, resp. rate 18, height 4' 10"  (1.473 m), weight 56.4 kg (124 lb 4.8 oz), SpO2 99 %. No results found. Results for orders placed or performed during the hospital encounter of 07/13/18 (from the past 72 hour(s))  Glucose, capillary     Status: Abnormal   Collection Time: 07/13/18  4:53 PM  Result Value Ref Range   Glucose-Capillary 308 (H) 70 - 99 mg/dL  Glucose, capillary     Status: None   Collection Time: 07/13/18 10:01 PM  Result Value Ref Range   Glucose-Capillary 94 70 - 99 mg/dL  Glucose, capillary     Status: Abnormal   Collection Time: 07/14/18  6:35 AM  Result Value Ref Range   Glucose-Capillary 173 (H) 70 - 99 mg/dL  CBC WITH DIFFERENTIAL     Status: Abnormal   Collection Time: 07/14/18  6:57 AM  Result Value Ref Range   WBC 12.0 (H) 4.0 - 10.5 K/uL    Comment: CORRECTED ON 07/20 AT 0742: PREVIOUSLY REPORTED AS 12.1   RBC 5.37 (H) 3.87 - 5.11 MIL/uL    Comment: CORRECTED ON 07/20 AT 0742: PREVIOUSLY REPORTED AS 5.46   Hemoglobin 12.0 12.0 - 15.0 g/dL   HCT 41.2 36.0 - 46.0 %    Comment: CORRECTED ON 07/20 AT 0742: PREVIOUSLY REPORTED AS 41.6   MCV 76.7 (L) 78.0 - 100.0 fL    Comment: CORRECTED ON 07/20 AT 0742: PREVIOUSLY REPORTED AS 76.2   MCH 22.3 (L) 26.0 - 34.0 pg    Comment: CORRECTED ON 07/20 AT 0742: PREVIOUSLY REPORTED AS 22.0   MCHC 29.1 (L) 30.0 - 36.0 g/dL    Comment: CORRECTED ON 07/20 AT 0742: PREVIOUSLY REPORTED AS 28.8   RDW 13.4 11.5 - 15.5 %   Platelets 207 150 - 400 K/uL    Comment: CORRECTED ON 07/20 AT 0742: PREVIOUSLY REPORTED AS 180   Neutrophils Relative % 68 %    Comment: CORRECTED ON 07/20 AT 0742: PREVIOUSLY REPORTED AS 69   Neutro Abs 8.3 (H) 1.7 - 7.7 K/uL   Comment: CORRECTED ON 07/20 AT 0742: PREVIOUSLY REPORTED AS 8.4   Lymphocytes Relative 21 %   Lymphs Abs 2.5 0.7 - 4.0 K/uL   Monocytes Relative 7 %    Comment: CORRECTED ON 07/20 AT 0742: PREVIOUSLY REPORTED AS 6   Monocytes Absolute 0.8 0.1 - 1.0 K/uL    Comment: CORRECTED ON 07/20 AT 0742: PREVIOUSLY REPORTED AS 0.7   Eosinophils Relative 3 %   Eosinophils Absolute 0.3 0.0 - 0.7 K/uL   Basophils Relative 1 %   Basophils Absolute 0.1 0.0 - 0.1 K/uL   Immature Granulocytes 0 %   Abs Immature Granulocytes 0.0 0.0 - 0.1 K/uL    Comment: Performed at [facility], [place]. [address] [place], [place] [address] CORRECTED ON 07/20 AT 0962: PREVIOUSLY REPORTED AS 0.1   Comprehensive metabolic panel     Status: Abnormal   Collection Time: 07/14/18  6:57 AM  Result Value Ref Range   Sodium 140 135 - 145 mmol/L   Potassium 4.2 3.5 - 5.1 mmol/L   Chloride 106 98 - 111 mmol/L    Comment: Please note change in reference range.   CO2 27 22 -  32 mmol/L   Glucose, Bld 179 (H) 70 - 99 mg/dL    Comment: Please note change in reference range.   BUN 11 8 - 23 mg/dL    Comment: Please note change in reference range.   Creatinine, Ser 0.90 0.44 - 1.00 mg/dL   Calcium 9.2 8.9 - 10.3 mg/dL   Total Protein 6.2 (L) 6.5 - 8.1 g/dL   Albumin 3.2 (L) 3.5 - 5.0 g/dL   AST 31 15 - 41 U/L   ALT 31 0 - 44 U/L    Comment: Please note change in reference range.   Alkaline Phosphatase 80 38 - 126 U/L   Total Bilirubin 0.9 0.3 - 1.2 mg/dL   GFR calc non Af Amer >60 >60 mL/min   GFR calc Af Amer >60 >60 mL/min    Comment: (NOTE) The eGFR has been calculated using the CKD EPI equation. This calculation has not been validated in all clinical situations. eGFR's persistently <60 mL/min signify possible Chronic Kidney Disease.    Anion gap 7 5 - 15    Comment: Performed at [facility] [address] [place], [place] [address]  Glucose, capillary     Status: Abnormal   Collection Time: 07/14/18  11:35 AM  Result Value Ref Range   Glucose-Capillary 144 (H) 70 - 99 mg/dL  Glucose, capillary     Status: Abnormal   Collection Time: 07/14/18  5:05 PM  Result Value Ref Range   Glucose-Capillary 192 (H) 70 - 99 mg/dL  Glucose, capillary     Status: Abnormal   Collection Time: 07/14/18  8:46 PM  Result Value Ref Range   Glucose-Capillary 172 (H) 70 - 99 mg/dL  Glucose, capillary     Status: Abnormal   Collection Time: 07/15/18  6:21 AM  Result Value Ref Range   Glucose-Capillary 165 (H) 70 - 99 mg/dL     HEENT: normal Cardio: RRR and no murmur Resp: CTA B/L and unlabored GI: BS positive and NT, ND Extremity:  Pulses positive and No Edema Skin:   Intact Neuro: Alert/Oriented, Normal Sensory, Abnormal Motor 2- Right grip, bicep and delt, tr triceps, 3/5 R HF KE 2- R ADF  and Abnormal FMC Ataxic/ dec FMC Musc/Skel:  Other no pain with UE or LE ROM Gen NAD   Assessment/Plan: 1. Functional deficits secondary to Right hemiparesis Left pontine infarct which require 3+ hours per day of interdisciplinary therapy in a comprehensive inpatient rehab setting. Physiatrist is providing close team supervision and 24 hour management of active medical problems listed below. Physiatrist and rehab team continue to assess barriers to discharge/monitor patient progress toward functional and medical goals. FIM: Function - Bathing Bathing activity did not occur: Refused  Function- Upper Body Dressing/Undressing Assist Level: (mod A) Function - Lower Body Dressing/Undressing What is the patient wearing?: Pants Position: Wheelchair/chair at sink Pants- Performed by patient: Thread/unthread right pants leg, Thread/unthread left pants leg Pants- Performed by helper: Pull pants up/down Assist for footwear: Partial/moderate assist Assist for lower body dressing: (mod A)  Function - Toileting Toileting steps completed by helper: Adjust clothing prior to toileting, Performs perineal hygiene, Adjust  clothing after toileting Assist level: Touching or steadying assistance (Pt.75%)  Function - Toilet Transfers Assist level to toilet: Moderate assist (Pt 50 - 74%/lift or lower) Assist level from toilet: Moderate assist (Pt 50 - 74%/lift or lower)  Function - Chair/bed transfer Chair/bed transfer method: Stand pivot Chair/bed transfer assist level: Touching or steadying assistance (Pt >  75%) Chair/bed transfer assistive device: Bedrails, Armrests Chair/bed transfer details: Verbal cues for precautions/safety, Manual facilitation for weight shifting, Manual facilitation for placement, Verbal cues for safe use of DME/AE, Verbal cues for sequencing, Verbal cues for technique  Function - Locomotion: Wheelchair Will patient use wheelchair at discharge?: No(anticipate pt to be primary ambulator at d/c) Wheelchair activity did not occur: Safety/medical concerns Wheel 50 feet with 2 turns activity did not occur: Safety/medical concerns Wheel 150 feet activity did not occur: Safety/medical concerns Function - Locomotion: Ambulation Assistive device: Cane-quad Max distance: 3' Assist level: Touching or steadying assistance (Pt > 75%) Assist level: Touching or steadying assistance (Pt > 75%) Assist level: Touching or steadying assistance (Pt > 75%) Walk 150 feet activity did not occur: Safety/medical concerns Walk 10 feet on uneven surfaces activity did not occur: Safety/medical concerns  Function - Comprehension Comprehension: Auditory Comprehension assist level: Follows basic conversation/direction with extra time/assistive device  Function - Expression Expression: Verbal Expression assist level: Expresses basic needs/ideas: With extra time/assistive device  Function - Social Interaction Social Interaction assist level: Interacts appropriately 90% of the time - Needs monitoring or encouragement for participation or interaction.  Function - Problem Solving Problem solving assist level:  Solves basic 75 - 89% of the time/requires cueing 10 - 24% of the time  Function - Memory Memory assistive device: Other (Comment) Memory assist level: Recognizes or recalls 75 - 89% of the time/requires cueing 10 - 24% of the time Patient normally able to recall (first 3 days only): That he or she is in a hospital, Current season  Medical Problem list and Plan: 1. Functional deficits secondary to left pontine infarct       CIR evals PT, OT           -pt needs a Optometrist for communication- translator with pt today 2. DVT proph with sq lovenox 17m daily 3. HTN: monitor with increased activity           -norvasc, metoprolol, lopressor.            Vitals:   07/14/18 1928 07/15/18 0425  BP: 129/68 (!) 145/72  Pulse: 61 60  Resp: 18 18  Temp: 98 F (36.7 C) 98 F (36.7 C)  SpO2: 91% 99%  permissive for now, in range of 1294or less systolic  4. Pain mgt: tylenol prn 5. Mood: team to provide ego support as necessary 6. Neuropsych: pt is competent to make decisions on her own behalf 7. Diabetes type 2: SSI for covg, TID and HS.  CBG (last 3)  Recent Labs    07/14/18 1705 07/14/18 2046 07/15/18 0621  GLUCAP 192* 172* 165*  occ spike            -metformin 8567mbid- has diarrhea will hold  And start low dose amaryl, CBG ok but may need to titrate           -follow for pattern.  8. Stroke prophylaxis with ASA and plavix 9.  Leukocytosis without fever monitor for now 10.  Diarrhea likely metformin which has been D/Ced, last BM 7/20  LOS (Days) 2 A FACE TO FACE EVALUATION WAS PERFORMED  AnCharlett Blake/21/2019, 9:10 AM

## 2018-07-15 NOTE — Progress Notes (Signed)
Pt and family not compliant with safety precautions. Family continues to ambulate pt to bathroom. Pt and family are educated about risk of fall multiple times. Continue plan of care.   Samantha Paul W Kayin Kettering

## 2018-07-16 ENCOUNTER — Inpatient Hospital Stay (HOSPITAL_COMMUNITY): Payer: Medicare Other

## 2018-07-16 ENCOUNTER — Inpatient Hospital Stay (HOSPITAL_COMMUNITY): Payer: Medicare Other | Admitting: Physical Therapy

## 2018-07-16 LAB — GLUCOSE, CAPILLARY
GLUCOSE-CAPILLARY: 192 mg/dL — AB (ref 70–99)
Glucose-Capillary: 154 mg/dL — ABNORMAL HIGH (ref 70–99)
Glucose-Capillary: 140 mg/dL — ABNORMAL HIGH (ref 70–99)
Glucose-Capillary: 168 mg/dL — ABNORMAL HIGH (ref 70–99)

## 2018-07-16 NOTE — Progress Notes (Signed)
Social Work Assessment and Plan  Patient Details  Name: Samantha Paul MRN: [id number] Date of Birth: [birth year]  Today's Date: 07/16/2018  Problem List:  Patient Active Problem List   Diagnosis Date Noted  . Left pontine stroke ([place]) 07/13/2018  . Essential hypertension   . Diabetes mellitus type 2 in nonobese ([facility])   . CVA (cerebral vascular accident) ([place]) 07/10/2018  . Hypertensive urgency 07/10/2018  . Hyperglycemia 07/10/2018  . Right sided weakness 07/10/2018  . Hyperlipidemia    Past Medical History:  Past Medical History:  Diagnosis Date  . Back pain   . HTN (hypertension)   . Knee pain    Past Surgical History: History reviewed. No pertinent surgical history. Social History:  reports that she has never smoked. She has never used smokeless tobacco. Her alcohol and drug histories are not on file.  Family / Support Systems Marital Status: Married How Long?: 107 years Patient Roles: Spouse, Parent, Other (Comment)(grandparent) Spouse/Significant Other: Alda Ponder - husband - [phone] Children: Hlong Ganesh - son - [phone]) [phone]; Shelbylynn Walczyk - son - [phone] Anticipated Caregiver: husband, son, family Ability/Limitations of Caregiver: husband has LUE deformity (broken per his report) but able to assist as able; son is physically able to assist Caregiver Availability: 24/7  Social History Preferred language: Dion Body Religion: Christian Cultural Background: Pt is from [place] 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   Abuse/Neglect Abuse/Neglect Assessment Can Be Completed: Unable to assess, patient is non-responsive or altered mental status(Pt was not alone.)  Emotional Status Pt's affect, behavior and adjustment status: Pt smiled and laughed during visit and displayed determination and motivation to  get better and work hard on CIR.  She has some frustration that she can't just get up and do things for herself and that her arm is not working. Recent Psychosocial Issues: none reported Psychiatric History: none reported Substance Abuse History: none reported  Patient / Family Perceptions, Expectations & Goals Pt/Family understanding of illness & functional limitations: Pt/husband stated that they have had their questions answered by the doctor and do not have any unanswered questions. Premorbid pt/family roles/activities: Pt watched the grandchildren and did the cooking/housekeeping. Anticipated changes in roles/activities/participation: Pt would like to resume activities as she is able. Pt/family expectations/goals: Pt wants to participate in all therapies and take advantage of this time and regain use of her right arm.  Community Resources [employer] Scripts: None Premorbid Home Care/DME Agencies: None Transportation available at discharge: husband Resource referrals recommended: Neuropsychology, Support group (specify)(stroke support group)  Discharge Planning Living Arrangements: Spouse/significant other, Children(live with son and 4 grandchildren) Support Systems: Spouse/significant other, Children, Other relatives, Friends/neighbors Type of Residence: Private residence Insurance underwriter Resources: Multimedia programmer (specify)(United Electrical engineer) Financial Resources: Fish farm manager, Family Support Financial Screen Referred: No Living Expenses: Lives with family Money Management: Spouse Does the patient have any problems obtaining your medications?: No Home Management: Pt was assisting with this.  Other family members will help out while she recovers. Patient/Family Preliminary Plans: Pt will go to one of her son's homes.  The home where she does not normally live may be more accessible.  Pt/husband are trying to figure this out. Social Work Anticipated Follow Up Needs: HH/OP,  Support Group Expected length of stay: 7 to 14 days  Clinical Impression CSW met with pt and her husband with the assistance of the interpreter Ronny Flurry)  to introduce self and role of CSW,as well as to complete assessment.  CSW answered pt's husband's questions about team conference, DME, and follow up therapies.  He will provide pt with 24/7 supervision and their sons and families are available to assist, also.  Pt and husband are trying to decide between two sons' home on which is more accessible to d/c home to.  He will let us know which they choose.  Husband will also need information on pt assistance programs for hospital bills and CSW will give him this information.  Pt's husband speaks/understands more English than pt.  CSW requested interpreter for the rest of the week's therapy sessions.  Pt's spirits seem to remain positive, but with some understandable frustration with her right hand not working properly and her not being able to do things on her own.  No current concerns/questions/needs.  CSW will continue to follow and assist as needed.  Averie Meiner, Silvestre Mesi 07/16/2018, 2:39 PM

## 2018-07-16 NOTE — Progress Notes (Signed)
Physical Therapy Session Note  Patient Details  Name: Samantha Paul MRN: [id number] DatJulieta Paul of Birth: [birth year]  Today's Date: 07/16/2018 PT Individual Time: 0827-0925 PT Individual Time Calculation (min): 58 min   Short Term Goals: Week 1:  PT Short Term Goal 1 (Week 1): Pt will ambulate 1850' in controlled environment w/ supervision PT Short Term Goal 2 (Week 1): Pt will maintain static standing w/ supervision w/ unilateral UE support PT Short Term Goal 3 (Week 1): Pt will demonstrate appropriate safety awareness w/o cues 100% of the time  PT Short Term Goal 4 (Week 1): Pt will transfer bed<>chair w/ supervision  Skilled Therapeutic Interventions/Progress Updates:    Session focused on NMR during functional mobility activities to address RUE/RLE motor control, coordination, balance, postural control retraining, and attention. Pt performing functional transfers throughout session with overall steadying assist with verbal and tactile cues for safety awareness and hand placement. Nustep per pt request for reciprocal movement pattern retraining of BUE and BLE to simulate gait x 10 min on level 4 with adaptive piece on R hand to maintain position. Mod assist for gait trial without AD with facilitation for weightshift and cues for increased activation through RLE during stance. TUG administered (with QC) for balance assessment to identify fall risk (see below). Toe taps to target for coordination, balance, and strengthening with UE support with min assist when lifting RLE and mod assist when lifting LLE to targets with facilitation for quad control/activation. UE NMR with active assisted movement with PT providing facilitation and support as well as use of ball/cup for grasp and UE ranger to activate musculature at shoulder, elbow and hand. Pt tendency for compensating with trunk and LUE despite verbal and tactile cues. Dynamic gait through obstacle course with cues for attention to R and making wider turn to  avoid obstacles with overall min assist. Returned to bed at end of session with husband at bedside.   Medical interpreter present for part of session (due to scheduling conflict). Husband present during entire session.   Therapy Documentation Precautions:  Precautions Precautions: Fall Precaution Comments: right hemiparesis, R inattention Restrictions Weight Bearing Restrictions: No    Pain:  Denies pain.     Balance: Balance Balance Assessed: Yes Standardized Balance Assessment Standardized Balance Assessment: Timed Up and Go Test Timed Up and Go Test TUG: Normal TUG(with QC) Normal TUG (seconds): 47  See Function Navigator for Current Functional Status.   Therapy/Group: Individual Therapy  Karolee StampsGray, Yenny Kosa Darrol PokeBrescia  Nahiara Kretzschmar B. Danett Palazzo, PT, DPT  07/16/2018, 2:04 PM

## 2018-07-16 NOTE — Progress Notes (Signed)
Subjective/Complaints: Translator with pt.  Pt slept well, is off to PT this am.  Husband with pt  ROS- limited due to language, but with translator help pt c/o cough when she reclines no caough with eating or drinking Objective: Vital Signs: Blood pressure 140/76, pulse 67, temperature 98 F (36.7 C), temperature source Oral, resp. rate 18, height 4' 10"  (1.473 m), weight 56.4 kg (124 lb 4.8 oz), SpO2 100 %. No results found. Results for orders placed or performed during the hospital encounter of 07/13/18 (from the past 72 hour(s))  Glucose, capillary     Status: Abnormal   Collection Time: 07/13/18  4:53 PM  Result Value Ref Range   Glucose-Capillary 308 (H) 70 - 99 mg/dL  Glucose, capillary     Status: None   Collection Time: 07/13/18 10:01 PM  Result Value Ref Range   Glucose-Capillary 94 70 - 99 mg/dL  Glucose, capillary     Status: Abnormal   Collection Time: 07/14/18  6:35 AM  Result Value Ref Range   Glucose-Capillary 173 (H) 70 - 99 mg/dL  CBC WITH DIFFERENTIAL     Status: Abnormal   Collection Time: 07/14/18  6:57 AM  Result Value Ref Range   WBC 12.0 (H) 4.0 - 10.5 K/uL    Comment: CORRECTED ON 07/20 AT 0742: PREVIOUSLY REPORTED AS 12.1   RBC 5.37 (H) 3.87 - 5.11 MIL/uL    Comment: CORRECTED ON 07/20 AT 0742: PREVIOUSLY REPORTED AS 5.46   Hemoglobin 12.0 12.0 - 15.0 g/dL   HCT 41.2 36.0 - 46.0 %    Comment: CORRECTED ON 07/20 AT 0742: PREVIOUSLY REPORTED AS 41.6   MCV 76.7 (L) 78.0 - 100.0 fL    Comment: CORRECTED ON 07/20 AT 0742: PREVIOUSLY REPORTED AS 76.2   MCH 22.3 (L) 26.0 - 34.0 pg    Comment: CORRECTED ON 07/20 AT 0742: PREVIOUSLY REPORTED AS 22.0   MCHC 29.1 (L) 30.0 - 36.0 g/dL    Comment: CORRECTED ON 07/20 AT 0742: PREVIOUSLY REPORTED AS 28.8   RDW 13.4 11.5 - 15.5 %   Platelets 207 150 - 400 K/uL    Comment: CORRECTED ON 07/20 AT 0742: PREVIOUSLY REPORTED AS 180   Neutrophils Relative % 68 %    Comment: CORRECTED ON 07/20 AT 0742: PREVIOUSLY REPORTED  AS 69   Neutro Abs 8.3 (H) 1.7 - 7.7 K/uL    Comment: CORRECTED ON 07/20 AT 0742: PREVIOUSLY REPORTED AS 8.4   Lymphocytes Relative 21 %   Lymphs Abs 2.5 0.7 - 4.0 K/uL   Monocytes Relative 7 %    Comment: CORRECTED ON 07/20 AT 0742: PREVIOUSLY REPORTED AS 6   Monocytes Absolute 0.8 0.1 - 1.0 K/uL    Comment: CORRECTED ON 07/20 AT 0742: PREVIOUSLY REPORTED AS 0.7   Eosinophils Relative 3 %   Eosinophils Absolute 0.3 0.0 - 0.7 K/uL   Basophils Relative 1 %   Basophils Absolute 0.1 0.0 - 0.1 K/uL   Immature Granulocytes 0 %   Abs Immature Granulocytes 0.0 0.0 - 0.1 K/uL    Comment: Performed at [facility], [address] [place], [place] [address] CORRECTED ON 07/20 AT 0742: PREVIOUSLY REPORTED AS 0.1   Comprehensive metabolic panel     Status: Abnormal   Collection Time: 07/14/18  6:57 AM  Result Value Ref Range   Sodium 140 135 - 145 mmol/L   Potassium 4.2 3.5 - 5.1 mmol/L   Chloride 106 98 - 111 mmol/L    Comment:  Please note change in reference range.   CO2 27 22 - 32 mmol/L   Glucose, Bld 179 (H) 70 - 99 mg/dL    Comment: Please note change in reference range.   BUN 11 8 - 23 mg/dL    Comment: Please note change in reference range.   Creatinine, Ser 0.90 0.44 - 1.00 mg/dL   Calcium 9.2 8.9 - 10.3 mg/dL   Total Protein 6.2 (L) 6.5 - 8.1 g/dL   Albumin 3.2 (L) 3.5 - 5.0 g/dL   AST 31 15 - 41 U/L   ALT 31 0 - 44 U/L    Comment: Please note change in reference range.   Alkaline Phosphatase 80 38 - 126 U/L   Total Bilirubin 0.9 0.3 - 1.2 mg/dL   GFR calc non Af Amer >60 >60 mL/min   GFR calc Af Amer >60 >60 mL/min    Comment: (NOTE) The eGFR has been calculated using the CKD EPI equation. This calculation has not been validated in all clinical situations. eGFR's persistently <60 mL/min signify possible Chronic Kidney Disease.    Anion gap 7 5 - 15    Comment: Performed at [facility] [address], [place], [place] [address]  Glucose, capillary      Status: Abnormal   Collection Time: 07/14/18 11:35 AM  Result Value Ref Range   Glucose-Capillary 144 (H) 70 - 99 mg/dL  Glucose, capillary     Status: Abnormal   Collection Time: 07/14/18  5:05 PM  Result Value Ref Range   Glucose-Capillary 192 (H) 70 - 99 mg/dL  Glucose, capillary     Status: Abnormal   Collection Time: 07/14/18  8:46 PM  Result Value Ref Range   Glucose-Capillary 172 (H) 70 - 99 mg/dL  Glucose, capillary     Status: Abnormal   Collection Time: 07/15/18  6:21 AM  Result Value Ref Range   Glucose-Capillary 165 (H) 70 - 99 mg/dL  Glucose, capillary     Status: Abnormal   Collection Time: 07/15/18 11:25 AM  Result Value Ref Range   Glucose-Capillary 204 (H) 70 - 99 mg/dL  Glucose, capillary     Status: None   Collection Time: 07/15/18  4:26 PM  Result Value Ref Range   Glucose-Capillary 95 70 - 99 mg/dL  Glucose, capillary     Status: Abnormal   Collection Time: 07/15/18  9:32 PM  Result Value Ref Range   Glucose-Capillary 179 (H) 70 - 99 mg/dL  Glucose, capillary     Status: Abnormal   Collection Time: 07/16/18  6:36 AM  Result Value Ref Range   Glucose-Capillary 154 (H) 70 - 99 mg/dL     HEENT: normal Cardio: RRR and no murmur Resp: CTA B/L and unlabored GI: BS positive and NT, ND Extremity:  Pulses positive and No Edema Skin:   Intact Neuro: Alert/Oriented, Normal Sensory, Abnormal Motor 2- Right grip, bicep and delt, tr triceps, 3/5 R HF KE 2- R ADF  and Abnormal FMC Ataxic/ dec FMC Musc/Skel:  Other no pain with UE or LE ROM Gen NAD   Assessment/Plan: 1. Functional deficits secondary to Right hemiparesis Left pontine infarct which require 3+ hours per day of interdisciplinary therapy in a comprehensive inpatient rehab setting. Physiatrist is providing close team supervision and 24 hour management of active medical problems listed below. Physiatrist and rehab team continue to assess barriers to discharge/monitor patient progress toward functional  and medical goals. FIM: Function - Bathing Bathing activity did not  occur: Refused Position: Research scientist (life sciences) parts bathed by patient: Left arm, Chest, Abdomen, Front perineal area, Buttocks, Right upper leg, Left upper leg, Left lower leg Body parts bathed by helper: Right arm, Right lower leg, Back  Function- Upper Body Dressing/Undressing What is the patient wearing?: Pull over shirt/dress Pull over shirt/dress - Perfomed by patient: Thread/unthread right sleeve, Put head through opening Pull over shirt/dress - Perfomed by helper: Thread/unthread left sleeve, Pull shirt over trunk Assist Level: (Mod A) Function - Lower Body Dressing/Undressing What is the patient wearing?: Pants, Non-skid slipper socks Position: Other (comment)(TTB) Pants- Performed by patient: Thread/unthread right pants leg, Thread/unthread left pants leg Pants- Performed by helper: Pull pants up/down Non-skid slipper socks- Performed by patient: Don/doff left sock Non-skid slipper socks- Performed by helper: Don/doff right sock Assist for footwear: Partial/moderate assist Assist for lower body dressing: (mod A)  Function - Toileting Toileting steps completed by patient: Adjust clothing prior to toileting, Performs perineal hygiene, Adjust clothing after toileting Toileting steps completed by helper: Adjust clothing prior to toileting, Performs perineal hygiene, Adjust clothing after toileting Assist level: Touching or steadying assistance (Pt.75%)  Function - Toilet Transfers Assist level to toilet: Moderate assist (Pt 50 - 74%/lift or lower) Assist level from toilet: Moderate assist (Pt 50 - 74%/lift or lower)  Function - Chair/bed transfer Chair/bed transfer method: Stand pivot Chair/bed transfer assist level: Touching or steadying assistance (Pt > 75%) Chair/bed transfer assistive device: Bedrails, Armrests Chair/bed transfer details: Verbal cues for precautions/safety, Manual facilitation for weight shifting,  Manual facilitation for placement, Verbal cues for safe use of DME/AE, Verbal cues for sequencing, Verbal cues for technique  Function - Locomotion: Wheelchair Will patient use wheelchair at discharge?: No(anticipate pt to be primary ambulator at d/c) Wheelchair activity did not occur: Safety/medical concerns Wheel 50 feet with 2 turns activity did not occur: Safety/medical concerns Wheel 150 feet activity did not occur: Safety/medical concerns Function - Locomotion: Ambulation Assistive device: Cane-quad Max distance: 13' Assist level: Touching or steadying assistance (Pt > 75%) Assist level: Touching or steadying assistance (Pt > 75%) Assist level: Touching or steadying assistance (Pt > 75%) Walk 150 feet activity did not occur: Safety/medical concerns Walk 10 feet on uneven surfaces activity did not occur: Safety/medical concerns  Function - Comprehension Comprehension: Auditory Comprehension assist level: Follows basic conversation/direction with extra time/assistive device  Function - Expression Expression: Verbal Expression assist level: Expresses basic needs/ideas: With extra time/assistive device  Function - Social Interaction Social Interaction assist level: Interacts appropriately 90% of the time - Needs monitoring or encouragement for participation or interaction.  Function - Problem Solving Problem solving assist level: Solves basic 75 - 89% of the time/requires cueing 10 - 24% of the time  Function - Memory Memory assistive device: Other (Comment) Memory assist level: Recognizes or recalls 75 - 89% of the time/requires cueing 10 - 24% of the time Patient normally able to recall (first 3 days only): That he or she is in a hospital, Current season  Medical Problem list and Plan: 1. Functional deficits secondary to left pontine infarct       CIR evals PT, OT           -pt needs a Optometrist for communication- translator with pt today 2. DVT proph with sq lovenox 58m  daily 3. HTN: monitor with increased activity           -norvasc, metoprolol, lopressor.            Vitals:   07/16/18  0515 07/16/18 0817  BP: (!) 141/73 140/76  Pulse: 67   Resp: 18   Temp: 98 F (36.7 C)   SpO2: 100%   permissive for now, in range of 707 or less systolic  4. Pain mgt: tylenol prn 5. Mood: team to provide ego support as necessary 6. Neuropsych: pt is competent to make decisions on her own behalf 7. Diabetes type 2: SSI for covg, TID and HS.  CBG (last 3)  Recent Labs    07/15/18 1626 07/15/18 2132 07/16/18 0636  GLUCAP 95 179* 154*  occ spike            -metformin 858m bid- has diarrhea will hold  And start low dose amaryl, CBG ok no changes          -follow for pattern.  8. Stroke prophylaxis with ASA and plavix 9.  Leukocytosis without fever monitor for now 10.  Diarrhea resolved off metformin, now with daily BM  LOS (Days) 3 A FACE TO FACE EVALUATION WAS PERFORMED  ACharlett Blake7/22/2019, 8:52 AM

## 2018-07-16 NOTE — Progress Notes (Signed)
Physical Therapy Session Note  Patient Details  Name: Samantha Paul MRN: [id number] Date of Birth: Apr 18, 1946  Today's Date: 07/16/2018 PT Individual Time: 0827-0925 PT Individual Time Calculation (min): 58 min   Short Term Goals: Week 1:  PT Short Term Goal 1 (Week 1): Pt will ambulate 5650' in controlled environment w/ supervision PT Short Term Goal 2 (Week 1): Pt will maintain static standing w/ supervision w/ unilateral UE support PT Short Term Goal 3 (Week 1): Pt will demonstrate appropriate safety awareness w/o cues 100% of the time  PT Short Term Goal 4 (Week 1): Pt will transfer bed<>chair w/ supervision  Skilled Therapeutic Interventions/Progress Updates:    Session initiated with pt lying in bed.  Pt nurse requesting that therapy sign pt husband off on helping wife to bathroom.  Pt denies pain thru interpretor who was present t/o session.  Pt BP: 113/62;  HR: 64 bpm.  Session focused on family education of how to guard pt during mobility and also improving R lower extremity closed chain power during functional mobility.  Pt performed blocked practice of sit to stand with L LE on dynadisc for 2 x 10, ambulated 100 ft x 2 with CGA from husband and therapist due to decreased R LE strength and concern for balance deficits.  Pt caregiver requires frequent cues to promote safety during mobility such as not pulling on arm to assist pt with supine to sit as pt able to perform supine to sit with supervision and also with guarding during ambulation.  Due to concern for caregiver learning/safety, pt not signed off during PT session, however, OT following re-checked education provided during session and caregiver able to guard pt during transfer to w/c and wheel pt to bathroom and then assist with toileting.  If pt requires supervision/assist for mobility, frequent and repetitive caregiver education will likely be needed to facilitate learning/carryover.  Following session, pt left up in room with caregiver  and interpretor present, call bell in reach, and chair alarm in place.  Therapy Documentation Precautions:  Precautions Precautions: Fall Precaution Comments: right hemiparesis, R inattention Restrictions Weight Bearing Restrictions: No  See Function Navigator for Current Functional Status.   Therapy/Group: Individual Therapy  Boston Catarino Elveria RisingM Kennedie Pardoe 07/16/2018, 12:15 PM

## 2018-07-16 NOTE — Progress Notes (Signed)
[facility] Individual Statement of Services  Patient Name:  Samantha Paul  Date:  07/16/2018  Welcome to the [facility].  Our goal is to provide you with an individualized program based on your diagnosis and situation, designed to meet your specific needs.  With this comprehensive rehabilitation program, you will be expected to participate in at least 3 hours of rehabilitation therapies Monday-Friday, with modified therapy programming on the weekends.  Your rehabilitation program will include the following services:  Physical Therapy (PT), Occupational Therapy (OT), 24 hour per day rehabilitation nursing, Case Management (Social Worker), Rehabilitation Medicine, Nutrition Services and Pharmacy Services  Weekly team conferences will be held on Wednesdays to discuss your progress.  Your Social Worker will talk with you frequently to get your input and to update you on team discussions.  Team conferences with you and your family in attendance may also be held.  Expected length of stay:  7 to 14 days Overall anticipated outcome:  Supervision with contact guard for stairs and shower transfers  Depending on your progress and recovery, your program may change. Your Social Worker will coordinate services and will keep you informed of any changes. Your Social Worker's name and contact numbers are listed  below.  The following services may also be recommended but are not provided by the [facility]patient [facility]:   Driving Evaluations  Home Health Rehabiltiation Services  Outpatient Rehabilitation Services   Arrangements will be made to provide these services after discharge if needed.  Arrangements include referral to agencies that provide these services.  Your insurance has been verified to be:  [employer] Your primary doctor is:  Gwinda PasseMichelle Edwards, NP  Pertinent information will be shared with your doctor and your insurance  company.  Social Worker:  Staci AcostaJenny Tambi Thole, LCSW  [phone] or ([phone]) [phone]  Information discussed with and copy given to patient by: Elvera LennoxPrevatt, Ayahna Solazzo Capps, 07/16/2018, 12:39 PM

## 2018-07-16 NOTE — Progress Notes (Signed)
Occupational Therapy Session Note  Patient Details  Name: Samantha Paul MRN: [id number] Date of Birth: September 09, 1946  Today's Date: 07/16/2018 OT Individual Time: 1000-1055 OT Individual Time Calculation (min): 55 min    Short Term Goals: Week 1:  OT Short Term Goal 1 (Week 1): Pt will require no more than 1 vc for attention to R side of meal tray OT Short Term Goal 2 (Week 1): [facility] Short Term Goal 3 (Week 1): Pt will don shirt with min A OT Short Term Goal 4 (Week 1): Pt will thread pants over B LE with min A  Skilled Therapeutic Interventions/Progress Updates:    Pt resting in w/c with husband present.  Practiced amb with LBQC to bathroom with husband assisting using Gait belt.  Pt requires mod A for amb with LBQC.  Practiced stand pivot transfer from bed to w/c and w/c to toilet with min A and use of Gait belt.  Recommended to husband to perform stand pivot transfers and not amb with pt.  Husband verbalized understanding and checked off on assisting pt to toilet.  RN staff notified and safety plan updated. Remainder of session focused on RUE NMR with weak finger flexion/extension noted.  Pt noted with improved shoulder and elbow movement noted.  Practiced active movements with focus on isolation to reduce compensatory strategies.  Pt declined shower this morning. Pt returned to w/c and remained in w/c with belt alarm activated and husband present.   Therapy Documentation Precautions:  Precautions Precautions: Fall Precaution Comments: right hemiparesis, R inattention Restrictions Weight Bearing Restrictions: No Pain:  Pt denies pain  See Function Navigator for Current Functional Status.   Therapy/Group: Individual Therapy  Rich BraveLanier, Errik Mitchelle Chappell 07/16/2018, 11:00 AM

## 2018-07-17 ENCOUNTER — Inpatient Hospital Stay (HOSPITAL_COMMUNITY): Payer: Medicare Other

## 2018-07-17 ENCOUNTER — Inpatient Hospital Stay (HOSPITAL_COMMUNITY): Payer: Medicare Other | Admitting: Physical Therapy

## 2018-07-17 ENCOUNTER — Inpatient Hospital Stay (HOSPITAL_COMMUNITY): Payer: Medicare Other | Admitting: Occupational Therapy

## 2018-07-17 LAB — GLUCOSE, CAPILLARY
GLUCOSE-CAPILLARY: 103 mg/dL — AB (ref 70–99)
GLUCOSE-CAPILLARY: 103 mg/dL — AB (ref 70–99)
GLUCOSE-CAPILLARY: 133 mg/dL — AB (ref 70–99)
Glucose-Capillary: 157 mg/dL — ABNORMAL HIGH (ref 70–99)

## 2018-07-17 NOTE — Progress Notes (Signed)
Subjective/Complaints:  On her way to OT, Discussed issues with translator assist ROS- limited due to language, but with translator help pt c/o weakness on RIght side but no pain Objective: Vital Signs: Blood pressure (!) 142/64, pulse 60, temperature 98.3 F (36.8 C), temperature source Oral, resp. rate 18, height 4\' 10"  (1.473 m), weight 56.4 kg (124 lb 4.8 oz), SpO2 98 %. No results found. Results for orders placed or performed during the hospital encounter of 07/13/18 (from the past 72 hour(s))  Glucose, capillary     Status: Abnormal   Collection Time: 07/14/18 11:35 AM  Result Value Ref Range   Glucose-Capillary 144 (H) 70 - 99 mg/dL  Glucose, capillary     Status: Abnormal   Collection Time: 07/14/18  5:05 PM  Result Value Ref Range   Glucose-Capillary 192 (H) 70 - 99 mg/dL  Glucose, capillary     Status: Abnormal   Collection Time: 07/14/18  8:46 PM  Result Value Ref Range   Glucose-Capillary 172 (H) 70 - 99 mg/dL  Glucose, capillary     Status: Abnormal   Collection Time: 07/15/18  6:21 AM  Result Value Ref Range   Glucose-Capillary 165 (H) 70 - 99 mg/dL  Glucose, capillary     Status: Abnormal   Collection Time: 07/15/18 11:25 AM  Result Value Ref Range   Glucose-Capillary 204 (H) 70 - 99 mg/dL  Glucose, capillary     Status: None   Collection Time: 07/15/18  4:26 PM  Result Value Ref Range   Glucose-Capillary 95 70 - 99 mg/dL  Glucose, capillary     Status: Abnormal   Collection Time: 07/15/18  9:32 PM  Result Value Ref Range   Glucose-Capillary 179 (H) 70 - 99 mg/dL  Glucose, capillary     Status: Abnormal   Collection Time: 07/16/18  6:36 AM  Result Value Ref Range   Glucose-Capillary 154 (H) 70 - 99 mg/dL  Glucose, capillary     Status: Abnormal   Collection Time: 07/16/18 11:36 AM  Result Value Ref Range   Glucose-Capillary 140 (H) 70 - 99 mg/dL  Glucose, capillary     Status: Abnormal   Collection Time: 07/16/18  5:16 PM  Result Value Ref Range   Glucose-Capillary 168 (H) 70 - 99 mg/dL  Glucose, capillary     Status: Abnormal   Collection Time: 07/16/18  9:37 PM  Result Value Ref Range   Glucose-Capillary 192 (H) 70 - 99 mg/dL  Glucose, capillary     Status: Abnormal   Collection Time: 07/17/18  6:22 AM  Result Value Ref Range   Glucose-Capillary 157 (H) 70 - 99 mg/dL     HEENT: normal Cardio: RRR and no murmur Resp: CTA B/L and unlabored GI: BS positive and NT, ND Extremity:  Pulses positive and No Edema Skin:   Intact Neuro: Alert/Oriented, Normal Sensory, Abnormal Motor 2- Right grip, bicep and delt, tr triceps, 3/5 R HF KE 2- R ADF  and Abnormal FMC Ataxic/ dec FMC Musc/Skel:  Other no pain with UE or LE ROM Gen NAD   Assessment/Plan: 1. Functional deficits secondary to Right hemiparesis Left pontine infarct which require 3+ hours per day of interdisciplinary therapy in a comprehensive inpatient rehab setting. Physiatrist is providing close team supervision and 24 hour management of active medical problems listed below. Physiatrist and rehab team continue to assess barriers to discharge/monitor patient progress toward functional and medical goals. FIM: Function - Bathing Bathing activity did not occur: Refused Position: Sport and exercise psychologist  parts bathed by patient: Left arm, Chest, Abdomen, Front perineal area, Buttocks, Right upper leg, Left upper leg, Left lower leg Body parts bathed by helper: Right arm, Right lower leg, Back  Function- Upper Body Dressing/Undressing What is the patient wearing?: Pull over shirt/dress Pull over shirt/dress - Perfomed by patient: Thread/unthread right sleeve, Put head through opening Pull over shirt/dress - Perfomed by helper: Thread/unthread left sleeve, Pull shirt over trunk Assist Level: (Mod A) Function - Lower Body Dressing/Undressing What is the patient wearing?: Pants, Non-skid slipper socks Position: Other (comment)(TTB) Pants- Performed by patient: Thread/unthread right pants  leg, Thread/unthread left pants leg Pants- Performed by helper: Pull pants up/down Non-skid slipper socks- Performed by patient: Don/doff left sock Non-skid slipper socks- Performed by helper: Don/doff right sock Assist for footwear: Partial/moderate assist Assist for lower body dressing: (mod A)  Function - Toileting Toileting steps completed by patient: Adjust clothing prior to toileting, Performs perineal hygiene, Adjust clothing after toileting Toileting steps completed by helper: Adjust clothing prior to toileting, Performs perineal hygiene, Adjust clothing after toileting Assist level: Touching or steadying assistance (Pt.75%)  Function - Toilet Transfers Assist level to toilet: Moderate assist (Pt 50 - 74%/lift or lower) Assist level from toilet: Moderate assist (Pt 50 - 74%/lift or lower)  Function - Chair/bed transfer Chair/bed transfer method: Stand pivot, Ambulatory Chair/bed transfer assist level: Touching or steadying assistance (Pt > 75%) Chair/bed transfer assistive device: Cane Chair/bed transfer details: Verbal cues for precautions/safety, Verbal cues for technique  Function - Locomotion: Wheelchair Will patient use wheelchair at discharge?: No(anticipate pt to be primary ambulator at d/c) Wheelchair activity did not occur: Safety/medical concerns Wheel 50 feet with 2 turns activity did not occur: Safety/medical concerns Wheel 150 feet activity did not occur: Safety/medical concerns Function - Locomotion: Ambulation Assistive device: Hand held assist Max distance: 15' Assist level: Moderate assist (Pt 50 - 74%) Assist level: Moderate assist (Pt 50 - 74%) Assist level: Touching or steadying assistance (Pt > 75%) Walk 150 feet activity did not occur: Safety/medical concerns Walk 10 feet on uneven surfaces activity did not occur: Safety/medical concerns  Function - Comprehension Comprehension: Auditory Comprehension assist level: Follows basic  conversation/direction with extra time/assistive device(translated from family)  Function - Expression Expression: Verbal Expression assistive device: Other (Comment)(family) Expression assist level: Expresses basic needs/ideas: With extra time/assistive device  Function - Social Interaction Social Interaction assist level: Interacts appropriately 90% of the time - Needs monitoring or encouragement for participation or interaction.  Function - Problem Solving Problem solving assist level: Solves basic 75 - 89% of the time/requires cueing 10 - 24% of the time  Function - Memory Memory assistive device: Other (Comment) Memory assist level: Recognizes or recalls 75 - 89% of the time/requires cueing 10 - 24% of the time Patient normally able to recall (first 3 days only): That he or she is in a hospital, Current season  Medical Problem list and Plan: 1. Functional deficits secondary to left pontine infarct       CIR evals PT, OT           team conf in am 2. DVT proph with sq lovenox 40mg  daily 3. HTN: monitor with increased activity           -norvasc, metoprolol, lopressor.            Vitals:   07/16/18 1932 07/17/18 0504  BP: (!) 155/81 (!) 142/64  Pulse: 66 60  Resp: 18 18  Temp: 99.1 F (37.3 C) 98.3  F (36.8 C)  SpO2: 97% 98%  permissive for now, in range of 180 or less systolic  4. Pain mgt: tylenol prn 5. Mood: team to provide ego support as necessary 6. Neuropsych: pt is competent to make decisions on her own behalf 7. Diabetes type 2: SSI for covg, TID and HS.  CBG (last 3)  Recent Labs    07/16/18 1716 07/16/18 2137 07/17/18 0622  GLUCAP 168* 192* 157*  occ spike            -metformin 850mg  bid- has diarrhea will hold  And start low dose amaryl, CBG ok, if >200 would increase amaryl         -follow for pattern.  8. Stroke prophylaxis with ASA and plavix 9.  Leukocytosis without fever  Repeat 7/25 10.  Diarrhea resolved off metformin, now with daily BM  LOS  (Days) 4 A FACE TO FACE EVALUATION WAS PERFORMED  Erick Colace 07/17/2018, 9:02 AM

## 2018-07-17 NOTE — Progress Notes (Signed)
Physical Therapy Session Note  Patient Details  Name: Samantha Paul MRN: [id number] Date of Birth: 1946/08/02  Today's Date: 07/17/2018 PT Individual Time: 1000-1058 AND 1300-1400 PT Individual Time Calculation (min): 58 min AND 60 min  Short Term Goals: Week 1:  PT Short Term Goal 1 (Week 1): Pt will ambulate 17' in controlled environment w/ supervision PT Short Term Goal 2 (Week 1): Pt will maintain static standing w/ supervision w/ unilateral UE support PT Short Term Goal 3 (Week 1): Pt will demonstrate appropriate safety awareness w/o cues 100% of the time  PT Short Term Goal 4 (Week 1): Pt will transfer bed<>chair w/ supervision  Skilled Therapeutic Interventions/Progress Updates:   Session 1: Pt in supine and agreeable to therapy, denies pain. Interpreter and husband present throughout session. Ambulated around unit in between activities w/ min assist using quad cane in 100-150' bouts. Tactile cues for upright posture, R foot clearance, and verbal cues for gait pattern. Improved gait speed from previous sessions, however no decrease in safety. Worked on RLE NMR in stance in gym. Performed UE reaching tasks w/ emphasis on R weight bearing and maintaining balance w/o UE support. Worked on R abduction activation in both open chain and closed chain w/ R lateral stepping and L forward stepping. Additionally performed R abduction and glut activation w/ supine bridge and R clamshells. Performed NuStep 5 min w/o UE assist to work on RLE muscle activation and 5 min w/ UE assist for endurance and overall global strengthening. Returned to room and ended session in w/c and in care of husband, all needs met.   Session 2:  Pt in supine and agreeable to therapy, denies pain. Put on shoes w/ min assist and ambulated to/from therapy gym w/ min assist using quad cane, verbal cues for safety and gait pattern. Worked on R hip muscle activation this session. Performed seated/isometric hip abduction 2x10, sit<>stands  w/ facilitation of R weight bearing 2x10, and partial knee bends w/ orange TB resistance for abduction hold 2x10. Tactile cues throughout for R abductor activation. Performed 2" step up w/ RLE w/o UE assist, min assist to maintain neutral pelvic alignment, 3x10 reps. Performed side-stepping in both directions 10' x2 reps w/ min cues to keep RLE pointed forward. Returned to room and ended session in supine and in care of son, all needs met. Provided ice to R knee for soreness/stiffness relief - RN made aware.   Therapy Documentation Precautions:  Precautions Precautions: Fall Precaution Comments: right hemiparesis, R inattention Restrictions Weight Bearing Restrictions: No Pain: Pain Assessment Pain Scale: 0-10 Pain Score: 0-No pain  See Function Navigator for Current Functional Status.   Therapy/Group: Individual Therapy  Eytan Carrigan K Arnette 07/17/2018, 11:00 AM

## 2018-07-17 NOTE — Progress Notes (Signed)
Occupational Therapy Session Note  Patient Details  Name: Samantha Paul MRN: [id number] Date of Birth: January 10, 1946  Today's Date: 07/17/2018 OT Individual Time: [id number] OT Individual Time Calculation (min): 60 min    Short Term Goals: Week 1:  OT Short Term Goal 1 (Week 1): Pt will require no more than 1 vc for attention to R side of meal tray OT Short Term Goal 2 (Week 1): Pt [facility] OT Short Term Goal 3 (Week 1): Pt will don shirt with min A OT Short Term Goal 4 (Week 1): Pt will thread pants over B LE with min A  Skilled Therapeutic Interventions/Progress Updates:    Pt seen this session for ADL training and RUE NMR with pt's spouse and translator present.  Pt received in bed and initially struggled with sitting to EOB as she was trying to sit up from supine.  Pt cued to roll to L side and then she pushed up with guiding A.  Pt used LBQC to ambulate to sink to perform oral care in standing.  Tactile cues to stand with feet seperated and for foot alignment.  She then ambulated to  toilet and then to shower with min A.  Pt needs mod cues with with doffing clothing.  She showered with min A using sit to stands for bottom.  In the shower her R side is against the wall so it was too difficult to facilitate hand over hand guiding to use RUE in bathing tasks.  She did not attempt to use her R hand to hold washcloth. Pt transferred to w/c to dress.  Min A to guide R hand through shirt and then pt was able to continue to don with cues.  Pt wore a skirt today and was able to place both feet in with min A to fully pull over hips.  Donned sandals with min a to adjust velcro strap.  Pt taken to gym via w/c to transfer to mat for RUE NMR to facilitate shoulder flexion and extension. Pt used UE ranger with guiding A, then B hands on light dowel bar for sh flex to 45 and pulling arms back.  Max cues at times to fully attend to R hand.  Min A to support arm with grasping  and release and stacking of cones. Pt has fair grasp and release and active movement in all of her arm. She is mostly limited by shoulder active range and R inattention.  Pt taken back to room and transferred back to bed. Pt's family in room with pt.    Therapy Documentation Precautions:  Precautions Precautions: Fall Precaution Comments: right hemiparesis, R inattention Restrictions Weight Bearing Restrictions: No   Pain: Pain Assessment Pain Scale: 0-10 Pain Score: 0-No pain ADL: ADL ADL Comments: See functional navigator   See Function Navigator for Current Functional Status.   Therapy/Group: Individual Therapy  Janeya Deyo 07/17/2018, 11:56 AM

## 2018-07-18 ENCOUNTER — Inpatient Hospital Stay (HOSPITAL_COMMUNITY): Payer: Medicare Other | Admitting: Physical Therapy

## 2018-07-18 ENCOUNTER — Inpatient Hospital Stay (HOSPITAL_COMMUNITY): Payer: Medicare Other | Admitting: Occupational Therapy

## 2018-07-18 ENCOUNTER — Inpatient Hospital Stay (HOSPITAL_COMMUNITY): Payer: Medicare Other

## 2018-07-18 LAB — GLUCOSE, CAPILLARY
Glucose-Capillary: 179 mg/dL — ABNORMAL HIGH (ref 70–99)
Glucose-Capillary: 116 mg/dL — ABNORMAL HIGH (ref 70–99)
Glucose-Capillary: 194 mg/dL — ABNORMAL HIGH (ref 70–99)
Glucose-Capillary: 226 mg/dL — ABNORMAL HIGH (ref 70–99)

## 2018-07-18 NOTE — Progress Notes (Signed)
Physical Therapy Session Note  Patient Details  Name: Samantha GuttingHpot Hottle MRN: [id number] Date of Birth: [birth year]  Today's Date: 07/18/2018 PT Individual Time: 1000-1115 PT Individual Time Calculation (min): 75 min   Short Term Goals: Week 1:  PT Short Term Goal 1 (Week 1): Pt will ambulate 2150' in controlled environment w/ supervision PT Short Term Goal 2 (Week 1): Pt will maintain static standing w/ supervision w/ unilateral UE support PT Short Term Goal 3 (Week 1): Pt will demonstrate appropriate safety awareness w/o cues 100% of the time  PT Short Term Goal 4 (Week 1): Pt will transfer bed<>chair w/ supervision  Skilled Therapeutic Interventions/Progress Updates:    Pt received seated in w/c in room, agreeable to PT. Pt reports some soreness in L leg during gait, not rated. Sit to stand with CGA to quad cane. Ambulation 2 x 150 ft with quad cane and CGA to min A, some ataxia with gait. Sit to supine with SBA. Supine bridges 3 x 10 reps for hip ext strengthening. Supine to sit with min A for trunk control from flat surface. Standing balance and BLE coordination in // bars: sidesteps with BUE support, 4" alt L/R step-taps, 4" step-ups fwd and lateral, cone taps. Nustep level 3 x 15 min with B UE/LE with RUE adaptive grip handle. Pt left seated in w/c in room with needs in reach and family present at end of session. Medical interpreter present during therapy session.  Therapy Documentation Precautions:  Precautions Precautions: Fall Precaution Comments: right hemiparesis, R inattention Restrictions Weight Bearing Restrictions: No  See Function Navigator for Current Functional Status.   Therapy/Group: Individual Therapy  Peter Congoaylor Edison Wollschlager, PT, DPT  07/18/2018, 2:28 PM

## 2018-07-18 NOTE — Progress Notes (Signed)
Subjective/Complaints:  No issues overnite    ROS- limited due to language, but with translator help pt c/o weakness on RIght side but no pain Objective: Vital Signs: Blood pressure 125/75, pulse 60, temperature 98.3 F (36.8 C), temperature source Oral, resp. rate 18, height 4' 10"  (1.473 m), weight 56.2 kg (123 lb 14.9 oz), SpO2 100 %. No results found. Results for orders placed or performed during the hospital encounter of 07/13/18 (from the past 72 hour(s))  Glucose, capillary     Status: Abnormal   Collection Time: 07/15/18 11:25 AM  Result Value Ref Range   Glucose-Capillary 204 (H) 70 - 99 mg/dL  Glucose, capillary     Status: None   Collection Time: 07/15/18  4:26 PM  Result Value Ref Range   Glucose-Capillary 95 70 - 99 mg/dL  Glucose, capillary     Status: Abnormal   Collection Time: 07/15/18  9:32 PM  Result Value Ref Range   Glucose-Capillary 179 (H) 70 - 99 mg/dL  Glucose, capillary     Status: Abnormal   Collection Time: 07/16/18  6:36 AM  Result Value Ref Range   Glucose-Capillary 154 (H) 70 - 99 mg/dL  Glucose, capillary     Status: Abnormal   Collection Time: 07/16/18 11:36 AM  Result Value Ref Range   Glucose-Capillary 140 (H) 70 - 99 mg/dL  Glucose, capillary     Status: Abnormal   Collection Time: 07/16/18  5:16 PM  Result Value Ref Range   Glucose-Capillary 168 (H) 70 - 99 mg/dL  Glucose, capillary     Status: Abnormal   Collection Time: 07/16/18  9:37 PM  Result Value Ref Range   Glucose-Capillary 192 (H) 70 - 99 mg/dL  Glucose, capillary     Status: Abnormal   Collection Time: 07/17/18  6:22 AM  Result Value Ref Range   Glucose-Capillary 157 (H) 70 - 99 mg/dL  Glucose, capillary     Status: Abnormal   Collection Time: 07/17/18 11:30 AM  Result Value Ref Range   Glucose-Capillary 103 (H) 70 - 99 mg/dL  Glucose, capillary     Status: Abnormal   Collection Time: 07/17/18  4:58 PM  Result Value Ref Range   Glucose-Capillary 103 (H) 70 - 99 mg/dL   Glucose, capillary     Status: Abnormal   Collection Time: 07/17/18  9:11 PM  Result Value Ref Range   Glucose-Capillary 133 (H) 70 - 99 mg/dL  Glucose, capillary     Status: Abnormal   Collection Time: 07/18/18  6:28 AM  Result Value Ref Range   Glucose-Capillary 179 (H) 70 - 99 mg/dL     HEENT: normal Cardio: RRR and no murmur Resp: CTA B/L and unlabored GI: BS positive and NT, ND Extremity:  Pulses positive and No Edema Skin:   Intact Neuro: Alert/Oriented, Normal Sensory, Abnormal Motor 2- Right grip, bicep and delt, tr triceps, 3/5 R HF KE 2- R ADF  and Abnormal FMC Ataxic/ dec FMC Musc/Skel:  Other no pain with UE or LE ROM Gen NAD   Assessment/Plan: 1. Functional deficits secondary to Right hemiparesis Left pontine infarct which require 3+ hours per day of interdisciplinary therapy in a comprehensive inpatient rehab setting. Physiatrist is providing close team supervision and 24 hour management of active medical problems listed below. Physiatrist and rehab team continue to assess barriers to discharge/monitor patient progress toward functional and medical goals. FIM: Function - Bathing Bathing activity did not occur: Refused Position: Shower Body parts bathed by patient:  Chest, Abdomen, Front perineal area, Buttocks, Right upper leg, Left upper leg, Left lower leg, Right lower leg, Right arm Body parts bathed by helper: Left arm, Back Assist Level: Touching or steadying assistance(Pt > 75%)  Function- Upper Body Dressing/Undressing What is the patient wearing?: Pull over shirt/dress Pull over shirt/dress - Perfomed by patient: Put head through opening, Thread/unthread right sleeve, Pull shirt over trunk Pull over shirt/dress - Perfomed by helper: Thread/unthread left sleeve, Pull shirt over trunk Assist Level: (Mod A) Function - Lower Body Dressing/Undressing What is the patient wearing?: Shoes, Pants(skirt and sandals) Position: Wheelchair/chair at sink Pants-  Performed by patient: Thread/unthread right pants leg, Thread/unthread left pants leg Pants- Performed by helper: Pull pants up/down Non-skid slipper socks- Performed by patient: Don/doff left sock Non-skid slipper socks- Performed by helper: Don/doff right sock Shoes - Performed by patient: Don/doff right shoe, Don/doff left shoe Shoes - Performed by helper: Fasten left, Fasten right Assist for footwear: Partial/moderate assist Assist for lower body dressing: Touching or steadying assistance (Pt > 75%)  Function - Toileting Toileting steps completed by patient: Adjust clothing prior to toileting, Performs perineal hygiene Toileting steps completed by helper: Adjust clothing prior to toileting, Performs perineal hygiene, Adjust clothing after toileting Assist level: Touching or steadying assistance (Pt.75%)  Function - Toilet Transfers Toilet transfer assistive device: Cane Assist level to toilet: Touching or steadying assistance (Pt > 75%) Assist level from toilet: Touching or steadying assistance (Pt > 75%)  Function - Chair/bed transfer Chair/bed transfer method: Stand pivot, Ambulatory Chair/bed transfer assist level: Touching or steadying assistance (Pt > 75%) Chair/bed transfer assistive device: Cane Chair/bed transfer details: Verbal cues for precautions/safety, Verbal cues for technique  Function - Locomotion: Wheelchair Will patient use wheelchair at discharge?: No(anticipate pt to be primary ambulator at d/c) Wheelchair activity did not occur: Safety/medical concerns Wheel 50 feet with 2 turns activity did not occur: Safety/medical concerns Wheel 150 feet activity did not occur: Safety/medical concerns Function - Locomotion: Ambulation Assistive device: Cane-quad Max distance: 150' Assist level: Touching or steadying assistance (Pt > 75%) Assist level: Touching or steadying assistance (Pt > 75%) Assist level: Touching or steadying assistance (Pt > 75%) Walk 150 feet  activity did not occur: Safety/medical concerns Assist level: Touching or steadying assistance (Pt > 75%) Walk 10 feet on uneven surfaces activity did not occur: Safety/medical concerns  Function - Comprehension Comprehension: Auditory Comprehension assist level: Follows basic conversation/direction with extra time/assistive device(translated from family)  Function - Expression Expression: Verbal Expression assistive device: Other (Comment)(family) Expression assist level: Expresses basic needs/ideas: With extra time/assistive device  Function - Social Interaction Social Interaction assist level: Interacts appropriately 90% of the time - Needs monitoring or encouragement for participation or interaction.  Function - Problem Solving Problem solving assist level: Solves basic 75 - 89% of the time/requires cueing 10 - 24% of the time  Function - Memory Memory assistive device: Other (Comment) Memory assist level: Recognizes or recalls 75 - 89% of the time/requires cueing 10 - 24% of the time Patient normally able to recall (first 3 days only): That he or she is in a hospital, Current season  Medical Problem list and Plan: 1. Functional deficits secondary to left pontine infarct       CIR evals PT, OT           Team conference today please see physician documentation under team conference tab, met with team face-to-face to discuss problems,progress, and goals. Formulized individual treatment plan based on medical history,  underlying problem and comorbidities. 2. DVT proph with sq lovenox 27m daily 3. HTN: monitor with increased activity           -norvasc, metoprolol, lopressor.            Vitals:   07/17/18 1959 07/18/18 0419  BP: 125/68 125/75  Pulse: 64 60  Resp: 18 18  Temp: 98.3 F (36.8 C) 98.3 F (36.8 C)  SpO2: 100% 100%  controlled 7/24 4. Pain mgt: tylenol prn 5. Mood: team to provide ego support as necessary 6. Neuropsych: pt is competent to make decisions on her  own behalf 7. Diabetes type 2: SSI for covg, TID and HS.  CBG (last 3)  Recent Labs    07/17/18 1658 07/17/18 2111 07/18/18 0628  GLUCAP 103* 133* 179*  occ spike            -metformin 8538mbid- has diarrhea will hold  And start low dose amaryl, CBG ok, if >200 would increase amaryl         -follow for pattern.  8. Stroke prophylaxis with ASA and plavix 9.  Leukocytosis without fever  Repeat 7/25 10.  Diarrhea resolved off metformin, now with daily BM  LOS (Days) 5 A FACE TO FACE EVALUATION WAS PERFORMED  AnCharlett Blake/24/2019, 9:20 AM

## 2018-07-18 NOTE — Progress Notes (Addendum)
Occupational Therapy Session Note  Patient Details  Name: Samantha Paul MRN: [id number] Date of Birth: Feb 06, 1946  Today's Date: 07/18/2018 OT Individual Time: [id number] OT Individual Time Calculation (min): 55 min    Short Term Goals: Week 1:  OT Short Term Goal 1 (Week 1): Pt will require no more than 1 vc for attention to R side of meal tray OT Short Term Goal 2 (Week 1): Pt [facility] OT Short Term Goal 3 (Week 1): Pt will don shirt with min A OT Short Term Goal 4 (Week 1): Pt will thread pants over B LE with min A  Skilled Therapeutic Interventions/Progress Updates:    1:1 Pt declined bathing and dressing today.  Pt ambulated to the gym with min to mod A with quad based cane with manual facilitation for upright posture with therapist hand supporting under right UE and at trunk. In gym pt did report soreness from being weak in right ankle.  Applied kinesotape to bilateral sides of ankle for support. Transitioned into side lying on the mat - laying on the left side. Focus on NMR of right UE (scapular mobility, shoulder, bicep, tricep flexion and extension) in all planes with support against gravity. Pt able to activate all mm but difficulty with sustained activation. Transitioned into sitting EOB position with continued focus on NMR of right UE  With functional reaching , grasp and release, supination/ pronation, wrist extension and shoulder stabilization in activity. Also got into quadruped position on the mat with mod A for heavy weight bearing- however pt with difficulty understanding commands for optimal position due to language barrier.   Pt ambulated back to room with min A with QBC and left in w/c with husband.   Therapy Documentation Precautions:  Precautions Precautions: Fall Precaution Comments: right hemiparesis, R inattention Restrictions Weight Bearing Restrictions: No Pain:  no c/o pain in session  ADL: ADL ADL Comments: See  functional navigator  See Function Navigator for Current Functional Status.   Therapy/Group: Individual Therapy  Roney MansSmith, Samantha Marcelino [facility] 07/18/2018, 10:46 AM

## 2018-07-18 NOTE — Patient Care Conference (Signed)
Inpatient RehabilitationTeam Conference and Plan of Care Update Date: 07/18/2018   Time: 11:00 AM    Patient Name: Samantha Paul      Medical Record Number: [id number]  Date of Birth: 1946-03-20 Sex: Female         Room/Bed: 4W25C/4W25C-01 Payor Info: Payor: [employer] MEDICARE / Plan: [employer] MEDICARE / Product Type: *No Product type* /    Admitting Diagnosis: L CVA  Admit Date/Time:  07/13/2018  1:07 PM Admission Comments: No comment available   Primary Diagnosis:  <principal problem not specified> Principal Problem: <principal problem not specified>  Patient Active Problem List   Diagnosis Date Noted  . Left pontine stroke (HCC) 07/13/2018  . Essential hypertension   . Diabetes mellitus type 2 in nonobese (HCC)   . CVA (cerebral vascular accident) (HCC) 07/10/2018  . Hypertensive urgency 07/10/2018  . Hyperglycemia 07/10/2018  . Right sided weakness 07/10/2018  . Hyperlipidemia     Expected Discharge Date: Expected Discharge Date: 07/25/18  Team Members Present: Physician leading conference: Dr. Claudette LawsAndrew Kirsteins Social Worker Present: Staci AcostaJenny Claudis Giovanelli, LCSW Nurse Present: Kennyth ArnoldStacey Jennings, RN PT Present: Midge MiniumBen Zaino, PT OT Present: Roney MansJennifer Smith, OT;Ardis Rowanom Lanier, COTA SLP Present: Feliberto Gottronourtney Payne, SLP PPS Coordinator present : Tora DuckMarie Noel, RN, CRRN     Current Status/Progress Goal Weekly Team Focus  Medical   Diarrhea resolved, needs translator, RIght hemiparesis improving  reduce fall risk, optimize DM management  D/C planning   Bowel/Bladder   Continent of b&b, lbm 07/17/2018  remain continent of b&b  assess b&b needs q shift and prn   Swallow/Nutrition/ Hydration             ADL's   steadying A with ambulation with cane for bathroom transfers, sit to stand, and with LB self care.  min A with UB self care.  RUE AROM is improvng with max cues to actively use RUE in functional tasks.  Supervision overall, except for CGA for tub transfers  ADL training, functional mobility,  RUE NMR, RUE forced use, pt / family education   Mobility   min guard w/ most functional mobility, gait up to 150' w/ quad cane, occasional LOB w/ min-mod assist to correct  supervision overall, CGA stairs   RLE NMR, functional mobility, family education, endurance, safety awareness   Communication             Safety/Cognition/ Behavioral Observations            Pain   no complaints of pain  pain 0/10  assess q shift and prn   Skin   no skin issues  remain free of infection and breakdown  assess q shift and prn    Rehab Goals Patient on target to meet rehab goals: Yes Rehab Goals Revised: none *See Care Plan and progress notes for long and short-term goals.     Barriers to Discharge  Current Status/Progress Possible Resolutions Date Resolved   Physician    Medical stability;Inaccessible home environment     progressing toward goals  cont rehab      Nursing                  PT  Inaccessible home environment  3-4 steps to enter home              OT                  SLP  SW                Discharge Planning/Teaching Needs:  Pt to go home with her husband to provide 24/7 supervision - they are still deciding on which sons' house they are going to.  Husband has already been checked off for transfers and will be here for other training, as needed.   Team Discussion:  Pt is getting a little strength back in her right arm and does not have any major medical issues.  RN reported that blood sugars are coming down a little at a time.  Pt with supervision level OT goals overall and is min to mod for bathing and dressing currently.  Pt is getting some return back in her arm, but it's weak.  Pt with right inattention to arm.  Pt is walking 150' with min A and quad cane, she will have supervision level gait goals at home.  Revisions to Treatment Plan:  none    Continued Need for Acute Rehabilitation Level of Care: The patient requires daily medical management by a  physician with specialized training in physical medicine and rehabilitation for the following conditions: Daily direction of a multidisciplinary physical rehabilitation program to ensure safe treatment while eliciting the highest outcome that is of practical value to the patient.: Yes Daily medical management of patient stability for increased activity during participation in an intensive rehabilitation regime.: Yes Daily analysis of laboratory values and/or radiology reports with any subsequent need for medication adjustment of medical intervention for : Neurological problems;Mood/behavior problems  Samantha Paul, [place] 07/18/2018, 1:45 PM

## 2018-07-18 NOTE — Progress Notes (Signed)
Occupational Therapy Session Note  Patient Details  Name: Samantha GuttingHpot Paul MRN: [id number] Date of Birth: 1946-06-12  Today's Date: 07/18/2018 OT Individual Time: 1330-1430 OT Individual Time Calculation (min): 60 min    Short Term Goals: Week 1:  OT Short Term Goal 1 (Week 1): Pt will require no more than 1 vc for attention to R side of meal tray OT Short Term Goal 2 (Week 1): Pt will complete stand pivot transfer to [facility] min A OT Short Term Goal 3 (Week 1): Pt will don shirt with min A OT Short Term Goal 4 (Week 1): Pt will thread pants over B LE with min A  Skilled Therapeutic Interventions/Progress Updates:    OT intervention with focus on functional amb with LBQC, standing balance, RUE NMR, and RUE functional movement to increase independence with BADLs. Pt amb with LBQC from room to RN station and transitioned to gym for RUE NMR in supine after weight bearing. Pt continues to demonstrate improved shoulder flexion/extension, abduction/adduction, and elbow flexion/extension. Pt with weak grasp and trace finger extension. Pt with trace wrist extension.  Pt practiced grasping small sponge cubes and releasing into cup.  Pt able to raise RUE with assist to release into cup.  Pt also practiced grasp/release of larger bean bags. Pt returned to room and remained in w/c with half lap tray on and all needs within reach.   Therapy Documentation Precautions:  Precautions Precautions: Fall Precaution Comments: right hemiparesis, R inattention Restrictions Weight Bearing Restrictions: No   Pain:  Pt denies pain  See Function Navigator for Current Functional Status.   Therapy/Group: Individual Therapy  Samantha BraveLanier, Samantha Paul 07/18/2018, 2:49 PM

## 2018-07-18 NOTE — Progress Notes (Signed)
Social Work Patient ID: Rica Records, female   DOB: 1946-04-08, 72 y.o.   MRN: [id number]   CSW met with pt and her family member with the assistance of the interpreter to update her on team conference discussion and to give her the targeted d/c date of 07-25-18.  CSW also gave her financial assistance information for hospital bill, at husband's request.  CSW will continue to follow and assist as needed.

## 2018-07-19 ENCOUNTER — Inpatient Hospital Stay (HOSPITAL_COMMUNITY): Payer: Medicare Other | Admitting: Physical Therapy

## 2018-07-19 ENCOUNTER — Inpatient Hospital Stay (HOSPITAL_COMMUNITY): Payer: Medicare Other

## 2018-07-19 ENCOUNTER — Inpatient Hospital Stay (HOSPITAL_COMMUNITY): Payer: Medicare Other | Admitting: Occupational Therapy

## 2018-07-19 LAB — CBC
HEMATOCRIT: 39.3 % (ref 36.0–46.0)
HEMOGLOBIN: 11.4 g/dL — AB (ref 12.0–15.0)
MCH: 22.1 pg — ABNORMAL LOW (ref 26.0–34.0)
MCHC: 29 g/dL — ABNORMAL LOW (ref 30.0–36.0)
MCV: 76.3 fL — ABNORMAL LOW (ref 78.0–100.0)
Platelets: 196 10*3/uL (ref 150–400)
RBC: 5.15 MIL/uL — ABNORMAL HIGH (ref 3.87–5.11)
RDW: 13.1 % (ref 11.5–15.5)
WBC: 10.2 10*3/uL (ref 4.0–10.5)

## 2018-07-19 LAB — BASIC METABOLIC PANEL
ANION GAP: 9 (ref 5–15)
BUN: 28 mg/dL — AB (ref 8–23)
CO2: 24 mmol/L (ref 22–32)
Calcium: 9.1 mg/dL (ref 8.9–10.3)
Chloride: 108 mmol/L (ref 98–111)
Creatinine, Ser: 0.96 mg/dL (ref 0.44–1.00)
GFR calc Af Amer: >60 mL/min (ref >60)
GFR, EST NON AFRICAN AMERICAN: 59 mL/min — AB (ref >60)
Glucose, Bld: 138 mg/dL — ABNORMAL HIGH (ref 70–99)
POTASSIUM: 4 mmol/L (ref 3.5–5.1)
SODIUM: 141 mmol/L (ref 135–145)

## 2018-07-19 LAB — GLUCOSE, CAPILLARY
GLUCOSE-CAPILLARY: 173 mg/dL — AB (ref 70–99)
GLUCOSE-CAPILLARY: 195 mg/dL — AB (ref 70–99)
Glucose-Capillary: 165 mg/dL — ABNORMAL HIGH (ref 70–99)
Glucose-Capillary: 172 mg/dL — ABNORMAL HIGH (ref 70–99)

## 2018-07-19 NOTE — Plan of Care (Signed)
  Problem: Consults Goal: RH STROKE PATIENT EDUCATION Description See Patient Education module for education specifics  Outcome: Progressing Goal: Diabetes Guidelines if Diabetic/Glucose > 140 Description If diabetic or lab glucose is > 140 mg/dl - Initiate Diabetes/Hyperglycemia Guidelines & Document Interventions  Outcome: Progressing   Problem: RH SAFETY Goal: RH STG ADHERE TO SAFETY PRECAUTIONS W/ASSISTANCE/DEVICE Description STG Adhere to Safety Precautions With Mod I Assistance/Device.  Outcome: Progressing

## 2018-07-19 NOTE — Progress Notes (Signed)
Occupational Therapy Session Note  Patient Details  Name: Samantha GuttingHpot Render MRN: [id number] Date of Birth: 11-02-1946  Today's Date: 07/19/2018 OT Individual Time: [id number] OT Individual Time Calculation (min): 60 min    Short Term Goals: Week 1:  OT Short Term Goal 1 (Week 1): Pt will require no more than 1 vc for attention to R side of meal tray OT Short Term Goal 2 (Week 1): Pt will [facility] stand [facility] OT Short Term Goal 3 (Week 1): Pt will don shirt with min A OT Short Term Goal 4 (Week 1): Pt will thread pants over B LE with min A      Skilled Therapeutic Interventions/Progress Updates:    Pt received in room already dressed for the day. Translator present.  She declined a shower or bath, stating her stomach was upset today. Pt requested to stay in room due to concern over needing the bathroom.   Pt worked on RUE AROM exercises from EOB using the UE ranger for shoulder ROM and then B hand holds on soft ball with pushing hands together and reaching ball forward.  Ball placed to side of pt and she worked on pushing down through ball.  Demonstrated to pt how to use [facility] as a tool for A/AROM of sh flex and ext with pt grasping handle.  Also with cane as a tool, pt worked on a/arom of wrist extension then finger extension.  She is demonstrating improved wrist extension against gravity but continues to have limited finger extension AROM.   Pt then moved into supine for B dowel bar chest presses with min A to support R arm, then R arm only supported overhead tricep extension followed by elbow flexion with slight resistance. Pt then practiced holding soft ball in B hands and she was able to independently reach arms over head and lower down to legs with straight arms.  Pt also practiced some bridging exercises for RLE. She moved back into sitting EOB for simulated bathing exercise with pt using R hand to hold wash cloth and wash tops of B thighs without A and then abdomen and L  arm with min A to support R shoulder.  Pt maintained grasp on washcloth well.  With hand on table top, a/arom of finger extension with pt flexing fingers around washcloth and then extending fingers with max A.  Pt tolerated exercises well with one short rest break.  Pt resting in sitting on EOB with spouse in room with her.   Therapy Documentation Precautions:  Precautions Precautions: Fall Precaution Comments: right hemiparesis, R inattention Restrictions Weight Bearing Restrictions: No    Vital Signs: Therapy Vitals Temp: 98.3 F (36.8 C) Temp Source: Oral Pulse Rate: 66 Resp: 16 Patient Position (if appropriate): Sitting Oxygen Therapy SpO2: 99 % O2 Device: Room Air Pain: Pain Assessment Pain Score: 0-No pain ADL: ADL ADL Comments: See functional navigator  See Function Navigator for Current Functional Status.   Therapy/Group: Individual Therapy  Limuel Nieblas 07/19/2018, 9:14 AM

## 2018-07-19 NOTE — Progress Notes (Signed)
Subjective/Complaints: Recurrent diarrhea Off metformin Does not drink milk at home but has put on cereal in hospital   ROS- limited due to language, but with translator help pt states she has no abd pain , nausea or vomiting, No CP or SOB    Objective: Vital Signs: Blood pressure 129/66, pulse 66, temperature 98.3 F (36.8 C), temperature source Oral, resp. rate 16, height _0  (1.473 m), weight 56.2 kg (123 lb 14.9 oz), SpO2 99 %. No results found. Results for orders placed or performed during the hospital encounter of 07/13/18 (from the past 72 hour(s))  Glucose, capillary     Status: Abnormal   Collection Time: 07/16/18 11:36 AM  Result Value Ref Range   Glucose-Capillary 140 (H) 70 - 99 mg/dL  Glucose, capillary     Status: Abnormal   Collection Time: 07/16/18  5:16 PM  Result Value Ref Range   Glucose-Capillary 168 (H) 70 - 99 mg/dL  Glucose, capillary     Status: Abnormal   Collection Time: 07/16/18  9:37 PM  Result Value Ref Range   Glucose-Capillary 192 (H) 70 - 99 mg/dL  Glucose, capillary     Status: Abnormal   Collection Time: 07/17/18  6:22 AM  Result Value Ref Range   Glucose-Capillary 157 (H) 70 - 99 mg/dL  Glucose, capillary     Status: Abnormal   Collection Time: 07/17/18 11:30 AM  Result Value Ref Range   Glucose-Capillary 103 (H) 70 - 99 mg/dL  Glucose, capillary     Status: Abnormal   Collection Time: 07/17/18  4:58 PM  Result Value Ref Range   Glucose-Capillary 103 (H) 70 - 99 mg/dL  Glucose, capillary     Status: Abnormal   Collection Time: 07/17/18  9:11 PM  Result Value Ref Range   Glucose-Capillary 133 (H) 70 - 99 mg/dL  Glucose, capillary     Status: Abnormal   Collection Time: 07/18/18  6:28 AM  Result Value Ref Range   Glucose-Capillary 179 (H) 70 - 99 mg/dL  Glucose, capillary     Status: Abnormal   Collection Time: 07/18/18 11:26 AM  Result Value Ref Range   Glucose-Capillary 116 (H) 70 - 99 mg/dL  Glucose, capillary     Status: Abnormal    Collection Time: 07/18/18  4:33 PM  Result Value Ref Range   Glucose-Capillary 194 (H) 70 - 99 mg/dL  Glucose, capillary     Status: Abnormal   Collection Time: 07/18/18  9:55 PM  Result Value Ref Range   Glucose-Capillary 226 (H) 70 - 99 mg/dL  Basic metabolic panel     Status: Abnormal   Collection Time: 07/19/18  4:53 AM  Result Value Ref Range   Sodium 141 135 - 145 mmol/L   Potassium 4.0 3.5 - 5.1 mmol/L   Chloride 108 98 - 111 mmol/L   CO2 24 22 - 32 mmol/L   Glucose, Bld 138 (H) 70 - 99 mg/dL   BUN 28 (H) 8 - 23 mg/dL   Creatinine, Ser 0.96 0.44 - 1.00 mg/dL   Calcium 9.1 8.9 - 10.3 mg/dL   GFR calc non Af Amer 59 (L) >60 mL/min   GFR calc Af Amer >60 >60 mL/min    Comment: (NOTE) The eGFR has been calculated using the CKD EPI equation. This calculation has not been validated in all clinical situations. eGFR's persistently <60 mL/min signify possible Chronic Kidney Disease.    Anion gap 9 5 - 15    Comment: Performed at  [facility], [place] [address], [place], [place] [address]  CBC     Status: Abnormal   Collection Time: 07/19/18  4:53 AM  Result Value Ref Range   WBC 10.2 4.0 - 10.5 K/uL   RBC 5.15 (H) 3.87 - 5.11 MIL/uL   Hemoglobin 11.4 (L) 12.0 - 15.0 g/dL   HCT 39.3 36.0 - 46.0 %   MCV 76.3 (L) 78.0 - 100.0 fL   MCH 22.1 (L) 26.0 - 34.0 pg   MCHC 29.0 (L) 30.0 - 36.0 g/dL   RDW 13.1 11.5 - 15.5 %   Platelets 196 150 - 400 K/uL    Comment: Performed at [facility], [place] [address] [place], [place] [address]     HEENT: normal Cardio: RRR and no murmur Resp: CTA B/L and unlabored GI: BS positive and NT, ND Extremity:  Pulses positive and No Edema Skin:   Intact Neuro: Alert/Oriented, Normal Sensory, Abnormal Motor 2- Right grip, bicep and delt, tr triceps, 3/5 R HF KE 2- R ADF  and Abnormal FMC Ataxic/ dec FMC Musc/Skel:  Other no pain with UE or LE ROM Gen NAD   Assessment/Plan: 1. Functional deficits secondary to Right hemiparesis Left  pontine infarct which require 3+ hours per day of interdisciplinary therapy in a comprehensive inpatient rehab setting. Physiatrist is providing close team supervision and 24 hour management of active medical problems listed below. Physiatrist and rehab team continue to assess barriers to discharge/monitor patient progress toward functional and medical goals. FIM: Function - Bathing Bathing activity did not occur: Refused Position: Shower Body parts bathed by patient: Chest, Abdomen, Front perineal area, Buttocks, Right upper leg, Left upper leg, Left lower leg, Right lower leg, Right arm Body parts bathed by helper: Left arm, Back Assist Level: Touching or steadying assistance(Pt > 75%)  Function- Upper Body Dressing/Undressing What is the patient wearing?: Pull over shirt/dress Pull over shirt/dress - Perfomed by patient: Put head through opening, Thread/unthread right sleeve, Pull shirt over trunk Pull over shirt/dress - Perfomed by helper: Thread/unthread left sleeve, Pull shirt over trunk Assist Level: (Mod A) Function - Lower Body Dressing/Undressing What is the patient wearing?: Shoes, Pants(skirt and sandals) Position: Wheelchair/chair at sink Pants- Performed by patient: Thread/unthread right pants leg, Thread/unthread left pants leg Pants- Performed by helper: Pull pants up/down Non-skid slipper socks- Performed by patient: Don/doff left sock Non-skid slipper socks- Performed by helper: Don/doff right sock Shoes - Performed by patient: Don/doff right shoe, Don/doff left shoe Shoes - Performed by helper: Fasten left, Fasten right Assist for footwear: Partial/moderate assist Assist for lower body dressing: Touching or steadying assistance (Pt > 75%)  Function - Toileting Toileting steps completed by patient: Adjust clothing prior to toileting, Performs perineal hygiene Toileting steps completed by helper: Adjust clothing prior to toileting, Performs perineal hygiene, Adjust  clothing after toileting Assist level: Touching or steadying assistance (Pt.75%)  Function - Toilet Transfers Toilet transfer assistive device: Cane Assist level to toilet: Touching or steadying assistance (Pt > 75%) Assist level from toilet: Touching or steadying assistance (Pt > 75%)  Function - Chair/bed transfer Chair/bed transfer method: Ambulatory Chair/bed transfer assist level: Touching or steadying assistance (Pt > 75%) Chair/bed transfer assistive device: Armrests, Cane Chair/bed transfer details: Verbal cues for precautions/safety, Verbal cues for technique  Function - Locomotion: Wheelchair Will patient use wheelchair at discharge?: No(anticipate pt to be primary ambulator at d/c) Wheelchair activity did not occur: Safety/medical concerns Wheel 50 feet with 2 turns activity did  not occur: Safety/medical concerns Wheel 150 feet activity did not occur: Safety/medical concerns Function - Locomotion: Ambulation Assistive device: Cane-quad Max distance: 150' Assist level: Touching or steadying assistance (Pt > 75%) Assist level: Touching or steadying assistance (Pt > 75%) Assist level: Touching or steadying assistance (Pt > 75%) Walk 150 feet activity did not occur: Safety/medical concerns Assist level: Touching or steadying assistance (Pt > 75%) Walk 10 feet on uneven surfaces activity did not occur: Safety/medical concerns  Function - Comprehension Comprehension: Auditory Comprehension assist level: Follows basic conversation/direction with extra time/assistive device  Function - Expression Expression: Verbal Expression assistive device: Other (Comment)(family) Expression assist level: Expresses basic needs/ideas: With extra time/assistive device  Function - Social Interaction Social Interaction assist level: Interacts appropriately 90% of the time - Needs monitoring or encouragement for participation or interaction.  Function - Problem Solving Problem solving  assist level: Solves basic 75 - 89% of the time/requires cueing 10 - 24% of the time  Function - Memory Memory assistive device: Other (Comment) Memory assist level: Recognizes or recalls 75 - 89% of the time/requires cueing 10 - 24% of the time Patient normally able to recall (first 3 days only): That he or she is in a hospital, Current season  Medical Problem list and Plan: 1. Functional deficits secondary to left pontine infarct       CIR evals PT, OT           Tent D/C 7/31 2. DVT proph with sq lovenox 28m daily 3. HTN: monitor with increased activity           -norvasc, metoprolol, lopressor.            Vitals:   07/18/18 1933 07/19/18 0610  BP: 129/66   Pulse: 67 66  Resp: 17 16  Temp: 99.5 F (37.5 C) 98.3 F (36.8 C)  SpO2: 97% 99%  controlled 7/25 4. Pain mgt: tylenol prn 5. Mood: team to provide ego support as necessary 6. Neuropsych: pt is competent to make decisions on her own behalf 7. Diabetes type 2: SSI for covg, TID and HS.  CBG (last 3)  Recent Labs    07/18/18 1126 07/18/18 1633 07/18/18 2155  GLUCAP 116* 194* 226*  occ spike            -metformin 8551mbid- has diarrhea will hold  And start low dose amaryl, CBG  >200 would increase amaryl    to 94m60maily     -follow for pattern.  8. Stroke prophylaxis with ASA and plavix 9.  Leukocytosis without fever  Repeat 7/25 10.  Diarrhea recurrent will place on lactose free diet given high percentage of lactose intolerance in asian population  LOS (Days) 6 A FACE TO FACE EVALUATION WAS PERFORMED  AndCharlett Blake25/2019, 9:48 AM

## 2018-07-19 NOTE — Progress Notes (Signed)
Occupational Therapy Session Note  Patient Details  Name: Samantha Paul MRN: [id number] Date of Birth: 1946-02-02  Today's Date: 07/19/2018 OT Individual Time: 1300-1400 OT Individual Time Calculation (min): 60 min    Short Term Goals: Week 1:  OT Short Term Goal 1 (Week 1): Pt will require no more than 1 vc for attention to R side of meal tray OT Short Term Goal 2 (Week 1): Pt will complete stand [facility] OT Short Term Goal 3 (Week 1): Pt will don shirt with min A OT Short Term Goal 4 (Week 1): Pt will thread pants over B LE with min A  Skilled Therapeutic Interventions/Progress Updates:    OT intervention with focus on functional amb with LBQC, RUE function, sitting balance, bed mobility, and safety awareness to increase independence with BADLs. Pt amb with LBQC from room to gym with steady A.  Pt instructed to hold RUE with elbow flexed to prevent arm from "hanging" when walking and to increase awareness of RUE. Pt initially engaged in supine RUE activities with focus on shoulder flexion and elbow flexion/extendion.  Pt noted with improved strength proximal>distal.  Pt sat EOM and continued with RUE tasks with increased difficulty with movements against gravity.  Pt transitioned to R hand tasks with focus on grasp and release.  Pt with improved grasp and release for small cubes and beanbags.  Pt with weak wrist flexion and trace wrist extension.  Pt returned to room and remained in w/c with half lap tray in place and husband present.   Therapy Documentation Precautions:  Precautions Precautions: Fall Precaution Comments: right hemiparesis, R inattention Restrictions Weight Bearing Restrictions: No   Pain:  PT denies pain but c/o soreness in RUE; repositioned ADL: ADL ADL Comments: See functional navigator  See Function Navigator for Current Functional Status.   Therapy/Group: Individual Therapy  Rich BraveLanier, Nichelle Renwick Chappell 07/19/2018, 2:55 PM

## 2018-07-19 NOTE — Progress Notes (Signed)
Physical Therapy Session Note  Patient Details  Name: Samantha Paul MRN: [id number] Date of Birth: February 09, 1946  Today's Date: 07/19/2018 PT Individual Time: 1000-1100 PT Individual Time Calculation (min): 60 min   Short Term Goals: Week 1:  PT Short Term Goal 1 (Week 1): Pt will ambulate 3950' in controlled environment w/ supervision PT Short Term Goal 2 (Week 1): Pt will maintain static standing w/ supervision w/ unilateral UE support PT Short Term Goal 3 (Week 1): Pt will demonstrate appropriate safety awareness w/o cues 100% of the time  PT Short Term Goal 4 (Week 1): Pt will transfer bed<>chair w/ supervision  Skilled Therapeutic Interventions/Progress Updates: Pt received supine in bed, denies pain and agreeable to treatment. Supine>sit with S and increased time, cues for attention to and safety of R wrist/hand. Stand pivot transfer to L into w/c with S. Shoes donned minA to tighten once pt able to don with S and increased time. Transported to gym totalA. Gait x75' with quad cane and min guard. Gait in hall sidestepping and backwards walking with UE support on therapist for side stepping, no UE support for backward walking; performed for focus on R glute med/max activation, passive hip flexor stretching, RLE NMR via forced use. Stairs ascent/descent with variable lead LE and min guard overall; reiterated education regarding leading with stronger LE (left) during ascent, R during descent, and performing RLE lead during therapy only to work on strengthening. Sit <>supine on mat min guard. Supine hip flexor stretch off edge of mat x2 min with light passive over pressure. Bridging in hip extended position 2x15 reps; tactile cues for R glute activation, reduce LLE assist. Supine>prone minA for managing RUE. Prone lying on elbows for prolonged RUE weight bearing and further hip flexor stretch. Prone scapular pushups x10 reps; poor activation noted through RUE, requires tactile cueing. Attempted quadruped  position however unable to achieve d/t core strength deficits. Long sit>short sit via reciprocal scooting, cues to incorporate R anterior pelvic rotation. Pt declines floor transfer at this time; educated regarding purpose of practicing transfer in the event of a fall at home. Pt reports understanding but does not want to try at this time d/t knee pain. Gait x150' with no AD and min guard increased to minA at pt fatigued; tactile cues for R glute activation in stance. Stand pivot to return to bed with S. Remained seated on EOB, alarm intact, all needs in reach. Added towel wedge under R w/c cushion for neutral pelvic alignment when sitting in chair.      Therapy Documentation Precautions:  Precautions Precautions: Fall Precaution Comments: right hemiparesis, R inattention Restrictions Weight Bearing Restrictions: No Pain: Pain Assessment Pain Score: 0-No pain   See Function Navigator for Current Functional Status.   Therapy/Group: Individual Therapy  Harlon Dittylizabeth J Johara Lodwick 07/19/2018, 10:57 AM

## 2018-07-20 ENCOUNTER — Inpatient Hospital Stay (HOSPITAL_COMMUNITY): Payer: Medicare Other | Admitting: Occupational Therapy

## 2018-07-20 ENCOUNTER — Inpatient Hospital Stay (HOSPITAL_COMMUNITY): Payer: Medicare Other

## 2018-07-20 ENCOUNTER — Inpatient Hospital Stay (HOSPITAL_COMMUNITY): Payer: Medicare Other | Admitting: Physical Therapy

## 2018-07-20 LAB — GLUCOSE, CAPILLARY
GLUCOSE-CAPILLARY: 121 mg/dL — AB (ref 70–99)
Glucose-Capillary: 161 mg/dL — ABNORMAL HIGH (ref 70–99)
Glucose-Capillary: 101 mg/dL — ABNORMAL HIGH (ref 70–99)
Glucose-Capillary: 218 mg/dL — ABNORMAL HIGH (ref 70–99)

## 2018-07-20 NOTE — Progress Notes (Signed)
Physical Therapy Session Note  Patient Details  Name: Samantha GuttingHpot Chesterfield MRN: [id number] Date of Birth: Jul 02, 1946  Today's Date: 07/20/2018 PT Individual Time: 1300-1415 PT Individual Time Calculation (min): 75 min   Short Term Goals: Week 1:  PT Short Term Goal 1 (Week 1): Pt will ambulate 5950' in controlled environment w/ supervision PT Short Term Goal 2 (Week 1): Pt will maintain static standing w/ supervision w/ unilateral UE support PT Short Term Goal 3 (Week 1): Pt will demonstrate appropriate safety awareness w/o cues 100% of the time  PT Short Term Goal 4 (Week 1): Pt will transfer bed<>chair w/ supervision  Skilled Therapeutic Interventions/Progress Updates:    Pt received supine in bed, agreeable to PT. No complaints of pain. Bed mobility Supervision. Sit to stand to quad cane Supervision. Ambulation 2 x 150 ft with quad cane and CGA, decrease in RLE clearance with onset of fatigue, pt is able to correct with v/c. Ascend/descend 12 stairs with 1-2 handrails and min A, step-to gait pattern ascending with LLE and descending with RLE. Supine bridges 3 x 10 reps with focus on increased R glute activation, BKFO 3 x 10 reps, SLR 2 x 10 reps AAROM. Standing BLE coordination activity: forward and lateral cone taps with BUE support. Nustep level 4 x 10 min with B UE/LE with RUE grip assist for global endurance and reciprocal movement pattern. Education with patient and husband with regards to d/c plan, equipment needed for d/c, and follow-up with next therapy. Pt left seated EOB with needs in reach, husband present. Medical interpreter present during therapy session.  Therapy Documentation Precautions:  Precautions Precautions: Fall Precaution Comments: right hemiparesis, R inattention Restrictions Weight Bearing Restrictions: No  See Function Navigator for Current Functional Status.   Therapy/Group: Individual Therapy  Peter Congoaylor Raksha Wolfgang, PT, DPT  07/20/2018, 3:31 PM

## 2018-07-20 NOTE — Plan of Care (Signed)
  Problem: RH SAFETY Goal: RH STG ADHERE TO SAFETY PRECAUTIONS W/ASSISTANCE/DEVICE Description STG Adhere to Safety Precautions With Mod I Assistance/Device.  Outcome: Progressing  Call light at hand, bed alarm, proper footwear

## 2018-07-20 NOTE — Progress Notes (Signed)
Physical Therapy Session Note  Patient Details  Name: Samantha Paul MRN: [id number] Date of Birth: 09/01/1946  Today's Date: 07/20/2018 PT Individual Time: 1000-1100 PT Individual Time Calculation (min): 60 min   Short Term Goals: Week 1:  PT Short Term Goal 1 (Week 1): Pt will ambulate 4150' in controlled environment w/ supervision PT Short Term Goal 2 (Week 1): Pt will maintain static standing w/ supervision w/ unilateral UE support PT Short Term Goal 3 (Week 1): Pt will demonstrate appropriate safety awareness w/o cues 100% of the time  PT Short Term Goal 4 (Week 1): Pt will transfer bed<>chair w/ supervision  Skilled Therapeutic Interventions/Progress Updates:    Pt supine in bed upon PT arrival, agreeable to therapy tx and denies pain. Pt transferred to sitting with supervision, performed sit<>stand with supervision and ambulated from room>gym with QC and min assist x 200 ft. Pt performed floor transfer from mat<>floor with min assist, verbal cues for techniques and education on what to do if she falls at home. Pt transferred to quadruped position with min assist, performed L UE arm raises in order to increase R UE weightbearing in quadruped position. Pt worked on dynamic standing balance this session without UE support in order to participate in [place] balance test as detailed below, discussed results with pt 40/56. Pt worked on dynamic standing balance and R NMR to performed sidestepping in each direction 4 x 10 ft and backwards ambulation. Pt worked on R LE weightbearing and NMR to performed x 10 sit<>stands with L LE on 4 inch step and to toss horseshoes while standing with L LE on 4 inch step, encouraging R lateral weightshift. Pt performed sidestepping using rail in hall 2 x 30 ft for NMR and hip strengthening, min assist. Pt ambulated back to room with min assist and QC, left seated in bed with needs in reach and bed alarm set.   Therapy Documentation Precautions:  Precautions Precautions:  Fall Precaution Comments: right hemiparesis, R inattention Restrictions Weight Bearing Restrictions: No   Balance Standardized Balance Assessment Standardized Balance Assessment: Berg Balance Test Berg Balance Test Sit to Stand: Able to stand without using hands and stabilize independently Standing Unsupported: Able to stand 2 minutes with supervision Sitting with Back Unsupported but Feet Supported on Floor or Stool: Able to sit safely and securely 2 minutes Stand to Sit: Sits safely with minimal use of hands Transfers: Able to transfer safely, definite need of hands Standing Unsupported with Eyes Closed: Able to stand 10 seconds safely Standing Ubsupported with Feet Together: Able to place feet together independently and stand for 1 minute with supervision From Standing, Reach Forward with Outstretched Arm: Can reach forward >12 cm safely (5") From Standing Position, Pick up Object from Floor: Able to pick up shoe, needs supervision From Standing Position, Turn to Look Behind Over each Shoulder: Looks behind one side only/other side shows less weight shift Turn 360 Degrees: Able to turn 360 degrees safely but slowly Standing Unsupported, Alternately Place Feet on Step/Stool: Able to complete >2 steps/needs minimal assist Standing Unsupported, One Foot in Front: Able to take small step independently and hold 30 seconds Standing on One Leg: Tries to lift leg/unable to hold 3 seconds but remains standing independently Total Score: 40   See Function Navigator for Current Functional Status.   Therapy/Group: Individual Therapy  Cresenciano GenreEmily van Schagen , PT, DPT 07/20/2018, 7:37 AM

## 2018-07-20 NOTE — Progress Notes (Signed)
Occupational Therapy Session Note  Patient Details  Name: Samantha Paul MRN: [id number] Date of Birth: 04/16/1946  Today's Date: 07/20/2018 OT Individual Time: 7829-56210830-0930 OT Individual Time Calculation (min): 60 min    Short Term Goals: Week 1:  OT Short Term Goal 1 (Week 1): Pt will require no more than 1 vc for attention to R side of meal tray OT Short Term Goal 2 (Week 1): Pt will complete stand [facility] OT Short Term Goal 3 (Week 1): Pt will don shirt with min A OT Short Term Goal 4 (Week 1): Pt will thread pants over B LE with min A  Skilled Therapeutic Interventions/Progress Updates:    Pt received in bed and agreeable to a shower. Pt used quad cane to ambulate to shower stall with steadying A.  Pt doffed clothing with steadying A for balance.  She used a wash mit on R hand and was able to use R arm actively to wash the majority of her body.  Safety cues as in standing pt wanted to stand on one foot and place other on bench to wash feet.   Cues to sit and wash feet.  Family present with translator and emphasized that this is a safety issue that they will need to be watching closely at home. Pt was able to actively use RUE to don all clothing and even used grasp to hold fabric.  Pt stood at sink to brush hair with close S.   RUE NMR: -Wall slides to 120 degrees of sh flex with min A to reach range, placed and holding 5 counts with pt controlling lowering. Pt hikes scapula if above 120. -seated EOB with table slides for gentle self ROM to shoulder. -table top "polishing" for AROM of shoulder/elbow -squeezing and releasing fingers on wash cloth with full finger AROM (she had minimal finger extension yesterday!) -tip pinch facilitation with picking up small containers of butter, jelly, sugar packets and placing in a bowl.  Pt resting in bed with family in room with her.  Therapy Documentation Precautions:  Precautions Precautions: Fall Precaution Comments: right  hemiparesis, R inattention Restrictions Weight Bearing Restrictions: No  Vital Signs: Therapy Vitals Temp: 98.3 F (36.8 C) Temp Source: Oral Pulse Rate: 66 Resp: 18 BP: 128/70 Patient Position (if appropriate): Sitting Oxygen Therapy SpO2: 100 % Pain:  no c/o pain ADL: ADL ADL Comments: See functional navigator  See Function Navigator for Current Functional Status.   Therapy/Group: Individual Therapy  [name] 07/20/2018, 10:35 AM

## 2018-07-20 NOTE — Progress Notes (Addendum)
Subjective/Complaints:  Diarrhea improved not drinking milk   ROS- limited due to language, but with translator help pt states she has no abd pain , nausea or vomiting, No CP or SOB    Objective: Vital Signs: Blood pressure 128/70, pulse 66, temperature 98.3 F (36.8 C), temperature source Oral, resp. rate 18, height 4' 10"  (1.473 m), weight 56.2 kg (123 lb 14.9 oz), SpO2 100 %. No results found. Results for orders placed or performed during the hospital encounter of 07/13/18 (from the past 72 hour(s))  Glucose, capillary     Status: Abnormal   Collection Time: 07/17/18 11:30 AM  Result Value Ref Range   Glucose-Capillary 103 (H) 70 - 99 mg/dL  Glucose, capillary     Status: Abnormal   Collection Time: 07/17/18  4:58 PM  Result Value Ref Range   Glucose-Capillary 103 (H) 70 - 99 mg/dL  Glucose, capillary     Status: Abnormal   Collection Time: 07/17/18  9:11 PM  Result Value Ref Range   Glucose-Capillary 133 (H) 70 - 99 mg/dL  Glucose, capillary     Status: Abnormal   Collection Time: 07/18/18  6:28 AM  Result Value Ref Range   Glucose-Capillary 179 (H) 70 - 99 mg/dL  Glucose, capillary     Status: Abnormal   Collection Time: 07/18/18 11:26 AM  Result Value Ref Range   Glucose-Capillary 116 (H) 70 - 99 mg/dL  Glucose, capillary     Status: Abnormal   Collection Time: 07/18/18  4:33 PM  Result Value Ref Range   Glucose-Capillary 194 (H) 70 - 99 mg/dL  Glucose, capillary     Status: Abnormal   Collection Time: 07/18/18  9:55 PM  Result Value Ref Range   Glucose-Capillary 226 (H) 70 - 99 mg/dL  Basic metabolic panel     Status: Abnormal   Collection Time: 07/19/18  4:53 AM  Result Value Ref Range   Sodium 141 135 - 145 mmol/L   Potassium 4.0 3.5 - 5.1 mmol/L   Chloride 108 98 - 111 mmol/L   CO2 24 22 - 32 mmol/L   Glucose, Bld 138 (H) 70 - 99 mg/dL   BUN 28 (H) 8 - 23 mg/dL   Creatinine, Ser 0.96 0.44 - 1.00 mg/dL   Calcium 9.1 8.9 - 10.3 mg/dL   GFR calc non Af Amer 59  (L) >60 mL/min   GFR calc Af Amer >60 >60 mL/min    Comment: (NOTE) The eGFR has been calculated using the CKD EPI equation. This calculation has not been validated in all clinical situations. eGFR's persistently <60 mL/min signify possible Chronic Kidney Disease.    Anion gap 9 5 - 15    Comment: Performed at [facility] [address], [place], [place] [address]  CBC     Status: Abnormal   Collection Time: 07/19/18  4:53 AM  Result Value Ref Range   WBC 10.2 4.0 - 10.5 K/uL   RBC 5.15 (H) 3.87 - 5.11 MIL/uL   Hemoglobin 11.4 (L) 12.0 - 15.0 g/dL   HCT 39.3 36.0 - 46.0 %   MCV 76.3 (L) 78.0 - 100.0 fL   MCH 22.1 (L) 26.0 - 34.0 pg   MCHC 29.0 (L) 30.0 - 36.0 g/dL   RDW 13.1 11.5 - 15.5 %   Platelets 196 150 - 400 K/uL    Comment: Performed at [facility], [place] [address] [place], [place] [address]  Glucose, capillary     Status:  Abnormal   Collection Time: 07/19/18  6:43 AM  Result Value Ref Range   Glucose-Capillary 165 (H) 70 - 99 mg/dL  Glucose, capillary     Status: Abnormal   Collection Time: 07/19/18 11:48 AM  Result Value Ref Range   Glucose-Capillary 173 (H) 70 - 99 mg/dL  Glucose, capillary     Status: Abnormal   Collection Time: 07/19/18  4:54 PM  Result Value Ref Range   Glucose-Capillary 172 (H) 70 - 99 mg/dL  Glucose, capillary     Status: Abnormal   Collection Time: 07/19/18  9:49 PM  Result Value Ref Range   Glucose-Capillary 195 (H) 70 - 99 mg/dL  Glucose, capillary     Status: Abnormal   Collection Time: 07/20/18  6:45 AM  Result Value Ref Range   Glucose-Capillary 161 (H) 70 - 99 mg/dL     HEENT: normal Cardio: RRR and no murmur Resp: CTA B/L and unlabored GI: BS positive and NT, ND Extremity:  Pulses positive and No Edema Skin:   Intact Neuro: Alert/Oriented, Normal Sensory, Abnormal Motor 2- Right grip, bicep and delt, tr triceps, 3/5 R HF KE 2- R ADF  and Abnormal FMC Ataxic/ dec FMC Musc/Skel:  Other no pain with UE or LE  ROM Gen NAD   Assessment/Plan: 1. Functional deficits secondary to Right hemiparesis Left pontine infarct which require 3+ hours per day of interdisciplinary therapy in a comprehensive inpatient rehab setting. Physiatrist is providing close team supervision and 24 hour management of active medical problems listed below. Physiatrist and rehab team continue to assess barriers to discharge/monitor patient progress toward functional and medical goals. FIM: Function - Bathing Bathing activity did not occur: Refused Position: Shower Body parts bathed by patient: Chest, Abdomen, Front perineal area, Buttocks, Right upper leg, Left upper leg, Left lower leg, Right lower leg, Right arm, Left arm(used wash mit) Body parts bathed by helper: Left arm, Back Assist Level: Touching or steadying assistance(Pt > 75%)  Function- Upper Body Dressing/Undressing What is the patient wearing?: Pull over shirt/dress, Bra Bra - Perfomed by patient: Thread/unthread right bra strap, Thread/unthread left bra strap Bra - Perfomed by helper: Hook/unhook bra (pull down sports bra) Pull over shirt/dress - Perfomed by patient: Put head through opening, Thread/unthread right sleeve, Pull shirt over trunk, Thread/unthread left sleeve Pull over shirt/dress - Perfomed by helper: Thread/unthread left sleeve, Pull shirt over trunk Assist Level: (Mod A) Function - Lower Body Dressing/Undressing What is the patient wearing?: Pants, Shoes Position: Wheelchair/chair at sink Pants- Performed by patient: Thread/unthread right pants leg, Thread/unthread left pants leg, Pull pants up/down Pants- Performed by helper: Pull pants up/down Non-skid slipper socks- Performed by patient: Don/doff left sock Non-skid slipper socks- Performed by helper: Don/doff right sock Shoes - Performed by patient: Don/doff right shoe, Don/doff left shoe Shoes - Performed by helper: Fasten left, Fasten right Assist for footwear: Partial/moderate  assist Assist for lower body dressing: Touching or steadying assistance (Pt > 75%)  Function - Toileting Toileting steps completed by patient: Adjust clothing prior to toileting, Performs perineal hygiene Toileting steps completed by helper: Adjust clothing prior to toileting, Performs perineal hygiene, Adjust clothing after toileting Assist level: Touching or steadying assistance (Pt.75%)  Function - Toilet Transfers Toilet transfer assistive device: Cane Assist level to toilet: Touching or steadying assistance (Pt > 75%) Assist level from toilet: Touching or steadying assistance (Pt > 75%)  Function - Chair/bed transfer Chair/bed transfer method: Stand pivot Chair/bed transfer assist level: Supervision or verbal cues  Chair/bed transfer assistive device: Armrests Chair/bed transfer details: Verbal cues for precautions/safety, Verbal cues for technique  Function - Locomotion: Wheelchair Will patient use wheelchair at discharge?: No(anticipate pt to be primary ambulator at d/c) Wheelchair activity did not occur: Safety/medical concerns Wheel 50 feet with 2 turns activity did not occur: Safety/medical concerns Wheel 150 feet activity did not occur: Safety/medical concerns Function - Locomotion: Ambulation Assistive device: No device Max distance: 150 Assist level: Touching or steadying assistance (Pt > 75%) Assist level: Touching or steadying assistance (Pt > 75%) Assist level: Touching or steadying assistance (Pt > 75%) Walk 150 feet activity did not occur: Safety/medical concerns Assist level: Touching or steadying assistance (Pt > 75%) Walk 10 feet on uneven surfaces activity did not occur: Safety/medical concerns  Function - Comprehension Comprehension: Auditory Comprehension assist level: Follows basic conversation/direction with extra time/assistive device  Function - Expression Expression: Verbal Expression assistive device: Other (Comment)(family and  interpreter) Expression assist level: Expresses basic needs/ideas: With extra time/assistive device  Function - Social Interaction Social Interaction assist level: Interacts appropriately 90% of the time - Needs monitoring or encouragement for participation or interaction.  Function - Problem Solving Problem solving assist level: Solves basic 75 - 89% of the time/requires cueing 10 - 24% of the time  Function - Memory Memory assistive device: Other (Comment) Memory assist level: Recognizes or recalls 75 - 89% of the time/requires cueing 10 - 24% of the time Patient normally able to recall (first 3 days only): Current season, That he or she is in a hospital  Medical Problem list and Plan: 1. Functional deficits secondary to left pontine infarct       CIR evals PT, OT           Tent D/C 7/31 2. DVT proph with sq lovenox 38m daily 3. HTN: monitor with increased activity           -norvasc, metoprolol, lopressor.            Vitals:   07/19/18 2153 07/20/18 0643  BP: (!) 141/70 128/70  Pulse: 67 66  Resp: (!) 21 18  Temp: 98.2 F (36.8 C) 98.3 F (36.8 C)  SpO2: 100% 100%  controlled 7/26 4. Pain mgt: tylenol prn 5. Mood: team to provide ego support as necessary 6. Neuropsych: pt is competent to make decisions on her own behalf 7. Diabetes type 2: SSI for covg, TID and HS.  CBG (last 3)  Recent Labs    07/19/18 1654 07/19/18 2149 07/20/18 0645  GLUCAP 172* 195* 161*  occ spike            -metformin 8560mbid- has diarrhea will hold  And start low dose amaryl, CBG  >200 would increase amaryl    to 43m20maily     -follow for pattern.  8. Stroke prophylaxis with ASA and plavix 9.  Leukocytosis without fever  Repeat 7/25, normal 10.  Diarrhea improved on lactose-free diet LOS (Days) 7 A FACE TO FACE EVALUATION WAS PERFORMED  AndCharlett Blake26/2019, 10:51 AM

## 2018-07-20 NOTE — Plan of Care (Signed)
  Problem: Consults Goal: RH STROKE PATIENT EDUCATION Description See Patient Education module for education specifics  Outcome: Progressing Goal: Diabetes Guidelines if Diabetic/Glucose > 140 Description If diabetic or lab glucose is > 140 mg/dl - Initiate Diabetes/Hyperglycemia Guidelines & Document Interventions  Outcome: Progressing   Problem: RH SAFETY Goal: RH STG ADHERE TO SAFETY PRECAUTIONS W/ASSISTANCE/DEVICE Description STG Adhere to Safety Precautions With Mod I Assistance/Device.  Outcome: Progressing   

## 2018-07-21 ENCOUNTER — Inpatient Hospital Stay (HOSPITAL_COMMUNITY): Payer: Medicare Other | Admitting: Occupational Therapy

## 2018-07-21 DIAGNOSIS — D62 Acute posthemorrhagic anemia: Secondary | ICD-10-CM

## 2018-07-21 LAB — GLUCOSE, CAPILLARY
GLUCOSE-CAPILLARY: 108 mg/dL — AB (ref 70–99)
Glucose-Capillary: 141 mg/dL — ABNORMAL HIGH (ref 70–99)
Glucose-Capillary: 132 mg/dL — ABNORMAL HIGH (ref 70–99)
Glucose-Capillary: 209 mg/dL — ABNORMAL HIGH (ref 70–99)

## 2018-07-21 NOTE — Progress Notes (Signed)
Occupational Therapy Session Note  Patient Details  Name: Samantha Paul MRN: [id number] Date of Birth: 02/18/1946  Today's Date: 07/21/2018 OT Individual Time: 0900-1015 OT Individual Time Calculation (min): 75 min    Short Term Goals: Week 1:  OT Short Term Goal 1 (Week 1): Pt will require no more than 1 vc for attention to R side of meal tray OT Short Term Goal 2 (Week 1): Pt will complete stand pivot transfer to [facility] with min A OT Short Term Goal 3 (Week 1): Pt will don shirt with min A OT Short Term Goal 4 (Week 1): Pt will thread pants over B LE with min A      Skilled Therapeutic Interventions/Progress Updates:    Pt received dressed and ready for the day with her spouse and translator present.  Pt worked on ambulation from her room to the ADL kitchen (over 300 ft) with one rest break half way with close S and use of her quad cane.   In the kitchen, set up activities to simulate many of the tasks that she does for [place] cooking to facilitate RUE functional use.  With a built up table knife, pt practiced cutting up small pieces of bread (as a substitute for vegetables) with hand over hand A 50% of the time.  She then transferred each 1 inch piece of bread independently to a large stock pot set on a small stool to simulate the low gas ovens that they use outside.  She then practiced stirring the food in the pot with light hand over hand A to maintain grasp on the spoon and guide her arm in circular patterns.  The bread was then transferred to a large skillet where she practiced turning the pieces as if she was sauteing. Recommended to spouse that she NOT do this at home unless she has supervision from an OT as she is at high risk of burns due to decreased R hand coordination.  She completed all these activities in standing.   Pt then sat at table with R side against table to practice HEP of table slides and A/arom for wrist extension.  Reviewed how pt can use handle of her cane to work on  active wrist flex and ext.  Pt then ambulated back to her room with 1 rest break . Pt resting on bed with all needs met.   Therapy Documentation Precautions:  Precautions Precautions: Fall Precaution Comments: right hemiparesis, R inattention Restrictions Weight Bearing Restrictions: No       Pain: no c/o pain of R shoulder during therapy session, just muscular fatigue   ADL: ADL ADL Comments: See functional navigator  See Function Navigator for Current Functional Status.   Therapy/Group: Individual Therapy  Fiorella Hanahan 07/21/2018, 12:47 PM

## 2018-07-21 NOTE — Progress Notes (Signed)
Subjective/Complaints: Patient seen sitting up in bed this morning. Husband at bedside and translates with limited [place]. She appears to have slept well overnight.   ROS- unable to obtain due to language  Objective: Vital Signs: Blood pressure 117/69, pulse 65, temperature 98.9 F (37.2 C), temperature source Oral, resp. rate 16, height _0  (1.473 m), weight 56.2 kg (123 lb 14.9 oz), SpO2 99 %. No results found. Results for orders placed or performed during the hospital encounter of 07/13/18 (from the past 72 hour(s))  Glucose, capillary     Status: Abnormal   Collection Time: 07/18/18  4:33 PM  Result Value Ref Range   Glucose-Capillary 194 (H) 70 - 99 mg/dL  Glucose, capillary     Status: Abnormal   Collection Time: 07/18/18  9:55 PM  Result Value Ref Range   Glucose-Capillary 226 (H) 70 - 99 mg/dL  Basic metabolic panel     Status: Abnormal   Collection Time: 07/19/18  4:53 AM  Result Value Ref Range   Sodium 141 135 - 145 mmol/L   Potassium 4.0 3.5 - 5.1 mmol/L   Chloride 108 98 - 111 mmol/L   CO2 24 22 - 32 mmol/L   Glucose, Bld 138 (H) 70 - 99 mg/dL   BUN 28 (H) 8 - 23 mg/dL   Creatinine, Ser 0.96 0.44 - 1.00 mg/dL   Calcium 9.1 8.9 - 10.3 mg/dL   GFR calc non Af Amer 59 (L) >60 mL/min   GFR calc Af Amer >60 >60 mL/min    Comment: (NOTE) The eGFR has been calculated using the CKD EPI equation. This calculation has not been validated in all clinical situations. eGFR's persistently <60 mL/min signify possible Chronic Kidney Disease.    Anion gap 9 5 - 15    Comment: Performed at [facility] [address], [place], [place] [address]  CBC     Status: Abnormal   Collection Time: 07/19/18  4:53 AM  Result Value Ref Range   WBC 10.2 4.0 - 10.5 K/uL   RBC 5.15 (H) 3.87 - 5.11 MIL/uL   Hemoglobin 11.4 (L) 12.0 - 15.0 g/dL   HCT 39.3 36.0 - 46.0 %   MCV 76.3 (L) 78.0 - 100.0 fL   MCH 22.1 (L) 26.0 - 34.0 pg   MCHC 29.0 (L) 30.0 - 36.0 g/dL   RDW 13.1 11.5  - 15.5 %   Platelets 196 150 - 400 K/uL    Comment: Performed at [facility], [place] [address], [place], [place] [address]  Glucose, capillary     Status: Abnormal   Collection Time: 07/19/18  6:43 AM  Result Value Ref Range   Glucose-Capillary 165 (H) 70 - 99 mg/dL  Glucose, capillary     Status: Abnormal   Collection Time: 07/19/18 11:48 AM  Result Value Ref Range   Glucose-Capillary 173 (H) 70 - 99 mg/dL  Glucose, capillary     Status: Abnormal   Collection Time: 07/19/18  4:54 PM  Result Value Ref Range   Glucose-Capillary 172 (H) 70 - 99 mg/dL  Glucose, capillary     Status: Abnormal   Collection Time: 07/19/18  9:49 PM  Result Value Ref Range   Glucose-Capillary 195 (H) 70 - 99 mg/dL  Glucose, capillary     Status: Abnormal   Collection Time: 07/20/18  6:45 AM  Result Value Ref Range   Glucose-Capillary 161 (H) 70 - 99 mg/dL  Glucose, capillary     Status: Abnormal  Collection Time: 07/20/18 11:39 AM  Result Value Ref Range   Glucose-Capillary 101 (H) 70 - 99 mg/dL   Comment 1 Notify RN   Glucose, capillary     Status: Abnormal   Collection Time: 07/20/18  4:58 PM  Result Value Ref Range   Glucose-Capillary 218 (H) 70 - 99 mg/dL   Comment 1 Notify RN   Glucose, capillary     Status: Abnormal   Collection Time: 07/20/18  9:25 PM  Result Value Ref Range   Glucose-Capillary 121 (H) 70 - 99 mg/dL   Comment 1 Notify RN   Glucose, capillary     Status: Abnormal   Collection Time: 07/21/18  7:05 AM  Result Value Ref Range   Glucose-Capillary 141 (H) 70 - 99 mg/dL  Glucose, capillary     Status: Abnormal   Collection Time: 07/21/18 11:51 AM  Result Value Ref Range   Glucose-Capillary 132 (H) 70 - 99 mg/dL   Comment 1 Notify RN      Constitutional: No distress . Vital signs reviewed. HENT: Normocephalic.  Atraumatic. Eyes: EOMI. No discharge. Cardiovascular: RRR. No JVD. Respiratory: CTA Bilaterally. Normal effort. GI: BS +. Non-distended. Musc: No edema or  tenderness in extremities. Skin:   Intact. Warm and dry Neuro: Alert Motor: RUE: 2/5 RLE: 3-/5 HF, KE, ADF   Gen NAD. Vital signs reviewed.    Assessment/Plan: 1. Functional deficits secondary to Right hemiparesis Left pontine infarct which require 3+ hours per day of interdisciplinary therapy in a comprehensive inpatient rehab setting. Physiatrist is providing close team supervision and 24 hour management of active medical problems listed below. Physiatrist and rehab team continue to assess barriers to discharge/monitor patient progress toward functional and medical goals. FIM: Function - Bathing Bathing activity did not occur: Refused Position: Shower Body parts bathed by patient: Chest, Abdomen, Front perineal area, Buttocks, Right upper leg, Left upper leg, Left lower leg, Right lower leg, Right arm, Left arm(used wash mit) Body parts bathed by helper: Left arm, Back Assist Level: Touching or steadying assistance(Pt > 75%)  Function- Upper Body Dressing/Undressing What is the patient wearing?: Pull over shirt/dress, Bra Bra - Perfomed by patient: Thread/unthread right bra strap, Thread/unthread left bra strap Bra - Perfomed by helper: Hook/unhook bra (pull down sports bra) Pull over shirt/dress - Perfomed by patient: Put head through opening, Thread/unthread right sleeve, Pull shirt over trunk, Thread/unthread left sleeve Pull over shirt/dress - Perfomed by helper: Thread/unthread left sleeve, Pull shirt over trunk Assist Level: (Mod A) Function - Lower Body Dressing/Undressing What is the patient wearing?: Pants, Shoes Position: Wheelchair/chair at sink Pants- Performed by patient: Thread/unthread right pants leg, Thread/unthread left pants leg, Pull pants up/down Pants- Performed by helper: Pull pants up/down Non-skid slipper socks- Performed by patient: Don/doff left sock Non-skid slipper socks- Performed by helper: Don/doff right sock Shoes - Performed by patient: Don/doff  right shoe, Don/doff left shoe Shoes - Performed by helper: Fasten left, Fasten right Assist for footwear: Partial/moderate assist Assist for lower body dressing: Touching or steadying assistance (Pt > 75%)  Function - Toileting Toileting steps completed by patient: Adjust clothing prior to toileting, Performs perineal hygiene Toileting steps completed by helper: Adjust clothing prior to toileting, Performs perineal hygiene, Adjust clothing after toileting Assist level: Touching or steadying assistance (Pt.75%)  Function - Toilet Transfers Toilet transfer assistive device: Cane Assist level to toilet: Touching or steadying assistance (Pt > 75%) Assist level from toilet: Touching or steadying assistance (Pt > 75%)  Function -  Chair/bed transfer Chair/bed transfer method: Ambulatory Chair/bed transfer assist level: Supervision or verbal cues Chair/bed transfer assistive device: Armrests Chair/bed transfer details: Verbal cues for precautions/safety, Verbal cues for technique  Function - Locomotion: Wheelchair Will patient use wheelchair at discharge?: No(anticipate pt to be primary ambulator at d/c) Wheelchair activity did not occur: Safety/medical concerns Wheel 50 feet with 2 turns activity did not occur: Safety/medical concerns Wheel 150 feet activity did not occur: Safety/medical concerns Function - Locomotion: Ambulation Assistive device: Cane-quad Max distance: 150 Assist level: Touching or steadying assistance (Pt > 75%) Assist level: Touching or steadying assistance (Pt > 75%) Assist level: Touching or steadying assistance (Pt > 75%) Walk 150 feet activity did not occur: Safety/medical concerns Assist level: Touching or steadying assistance (Pt > 75%) Walk 10 feet on uneven surfaces activity did not occur: Safety/medical concerns  Function - Comprehension Comprehension: Auditory Comprehension assist level: Follows basic conversation/direction with extra time/assistive  device  Function - Expression Expression: Verbal Expression assistive device: Other (Comment)(family and interpreter) Expression assist level: Expresses basic needs/ideas: With extra time/assistive device  Function - Social Interaction Social Interaction assist level: Interacts appropriately 90% of the time - Needs monitoring or encouragement for participation or interaction.  Function - Problem Solving Problem solving assist level: Solves basic 75 - 89% of the time/requires cueing 10 - 24% of the time  Function - Memory Memory assistive device: Other (Comment) Memory assist level: Recognizes or recalls 75 - 89% of the time/requires cueing 10 - 24% of the time Patient normally able to recall (first 3 days only): Current season, That he or she is in a hospital  Medical Problem list and Plan: 1. 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 DVT proph with sq lovenox 48m daily 3. HTN: monitor with increased activity           -norvasc, metoprolol, lopressor.            Vitals:   07/20/18 2059 07/21/18 0514  BP: 129/61 117/69  Pulse: 73 65  Resp: 17 16  Temp: 99.3 F (37.4 C) 98.9 F (37.2 C)  SpO2: 99% 99%   Controlled on 7/27 4. Pain mgt: tylenol prn 5. Mood: team to provide ego support as necessary 6. Neuropsych: pt is competent to make decisions on her own behalf 7. Diabetes type 2: SSI for covg, TID and HS.  CBG (last 3)  Recent Labs    07/20/18 2125 07/21/18 0705 07/21/18 1151  GLUCAP 121* 141* 132*             -metformin 8573mbid- was having diarrhea, holding and start low dose amaryl, increased amaryl to 10m710maily  Controlled on 7/27 8. Stroke prophylaxis with ASA and plavix 9.  Leukocytosis without fever  : Resolved 10.  Diarrhea improved on lactose-free diet  11. Acute blood loss anemia  Hemoglobin 11.4  on 7/24  Continue to monitor  LOS (Days) 8 A FACE TO FACE EVALUATION WAS PERFORMED Omkar Stratmann AniLorie Phenix27/2019, 1:07 PM

## 2018-07-22 ENCOUNTER — Inpatient Hospital Stay (HOSPITAL_COMMUNITY): Payer: Medicare Other

## 2018-07-22 DIAGNOSIS — R7309 Other abnormal glucose: Secondary | ICD-10-CM

## 2018-07-22 LAB — GLUCOSE, CAPILLARY
GLUCOSE-CAPILLARY: 91 mg/dL (ref 70–99)
Glucose-Capillary: 136 mg/dL — ABNORMAL HIGH (ref 70–99)
Glucose-Capillary: 130 mg/dL — ABNORMAL HIGH (ref 70–99)
Glucose-Capillary: 156 mg/dL — ABNORMAL HIGH (ref 70–99)

## 2018-07-22 NOTE — Plan of Care (Signed)
  Problem: Consults Goal: RH STROKE PATIENT EDUCATION Description See Patient Education module for education specifics  Outcome: Progressing Goal: Diabetes Guidelines if Diabetic/Glucose > 140 Description If diabetic or lab glucose is > 140 mg/dl - Initiate Diabetes/Hyperglycemia Guidelines & Document Interventions  Outcome: Progressing   Problem: RH SAFETY Goal: RH STG ADHERE TO SAFETY PRECAUTIONS W/ASSISTANCE/DEVICE Description STG Adhere to Safety Precautions With Mod I Assistance/Device.  Outcome: Progressing

## 2018-07-22 NOTE — Progress Notes (Signed)
Physical Therapy Session Note  Patient Details  Name: Samantha Paul MRN: [id number] Date of Birth: 1946/04/26  Today's Date: 07/22/2018 PT Individual Time: 1401-1500 PT Individual Time Calculation (min): 59 min   Short Term Goals: Week 1:  PT Short Term Goal 1 (Week 1): Pt will ambulate 6450' in controlled environment w/ supervision PT Short Term Goal 2 (Week 1): Pt will maintain static standing w/ supervision w/ unilateral UE support PT Short Term Goal 3 (Week 1): Pt will demonstrate appropriate safety awareness w/o cues 100% of the time  PT Short Term Goal 4 (Week 1): Pt will transfer bed<>chair w/ supervision  Skilled Therapeutic Interventions/Progress Updates:    Pt supine in bed upon PT arrival, agreeable to therapy tx and denies pain. Pt transferred to sitting EOB with supervision and donned shoes with min assist. Pt ambulated from room>chairs in hallway x 120 ft and then to the gym x 60 ft with QC and stand by assist. Pt worked on dynamic standing balance while standing on foam to complete peg board puzzle and to participate in ball toss, CGA for standing balance. Pt ascended/descended 8 steps with single handrail and min assist, step to pattern with verbal cues for techniques. Pt worked on R LE neuro re-ed and strengthening to perform x 10 sit<>stands and x 10 mini squats with L LE on airex in order to increase R LE weightbearing. Pt ambulated back to room with single seated rest break on the way, CGA with QC and verbal cues for increased R step length and heel strike. Pt left in care of family.   Therapy Documentation Precautions:  Precautions Precautions: Fall Precaution Comments: right hemiparesis, R inattention Restrictions Weight Bearing Restrictions: No   See Function Navigator for Current Functional Status.   Therapy/Group: Individual Therapy  Cresenciano GenreEmily van Schagen, PT, DPT 07/22/2018, 10:56 AM

## 2018-07-22 NOTE — Progress Notes (Addendum)
Subjective/Complaints: Patient seen sitting up at the EOB and then ambulating to restroom this AM with supervision and quad cane.  Husband at bedside, translates limited amount.  Pt slept well overnight.    ROS- unable to obtain due to language  Objective: Vital Signs: Blood pressure 115/63, pulse 66, temperature 99.1 F (37.3 C), temperature source Oral, resp. rate 18, height 4\' 10"  (1.473 m), weight 56.2 kg (123 lb 14.9 oz), SpO2 98 %. No results found. Results for orders placed or performed during the hospital encounter of 07/13/18 (from the past 72 hour(s))  Glucose, capillary     Status: Abnormal   Collection Time: 07/19/18  4:54 PM  Result Value Ref Range   Glucose-Capillary 172 (H) 70 - 99 mg/dL  Glucose, capillary     Status: Abnormal   Collection Time: 07/19/18  9:49 PM  Result Value Ref Range   Glucose-Capillary 195 (H) 70 - 99 mg/dL  Glucose, capillary     Status: Abnormal   Collection Time: 07/20/18  6:45 AM  Result Value Ref Range   Glucose-Capillary 161 (H) 70 - 99 mg/dL  Glucose, capillary     Status: Abnormal   Collection Time: 07/20/18 11:39 AM  Result Value Ref Range   Glucose-Capillary 101 (H) 70 - 99 mg/dL   Comment 1 Notify RN   Glucose, capillary     Status: Abnormal   Collection Time: 07/20/18  4:58 PM  Result Value Ref Range   Glucose-Capillary 218 (H) 70 - 99 mg/dL   Comment 1 Notify RN   Glucose, capillary     Status: Abnormal   Collection Time: 07/20/18  9:25 PM  Result Value Ref Range   Glucose-Capillary 121 (H) 70 - 99 mg/dL   Comment 1 Notify RN   Glucose, capillary     Status: Abnormal   Collection Time: 07/21/18  7:05 AM  Result Value Ref Range   Glucose-Capillary 141 (H) 70 - 99 mg/dL  Glucose, capillary     Status: Abnormal   Collection Time: 07/21/18 11:51 AM  Result Value Ref Range   Glucose-Capillary 132 (H) 70 - 99 mg/dL   Comment 1 Notify RN   Glucose, capillary     Status: Abnormal   Collection Time: 07/21/18  4:41 PM  Result  Value Ref Range   Glucose-Capillary 209 (H) 70 - 99 mg/dL   Comment 1 Notify RN   Glucose, capillary     Status: Abnormal   Collection Time: 07/21/18  9:13 PM  Result Value Ref Range   Glucose-Capillary 108 (H) 70 - 99 mg/dL   Comment 1 Notify RN   Glucose, capillary     Status: Abnormal   Collection Time: 07/22/18  6:52 AM  Result Value Ref Range   Glucose-Capillary 136 (H) 70 - 99 mg/dL   Comment 1 Notify RN   Glucose, capillary     Status: None   Collection Time: 07/22/18 11:43 AM  Result Value Ref Range   Glucose-Capillary 91 70 - 99 mg/dL     Constitutional: No distress . Vital signs reviewed. HENT: Normocephalic.  Atraumatic. Eyes: EOMI. No discharge. Cardiovascular: RRR. No JVD. Respiratory: CTA bilaterally. Normal effort. GI: BS +. Non-distended. Musc: No edema or tenderness in extremities. Skin:   Intact. Warm and dry Neuro: Alert Motor: RUE: 2/5 proximal to distal RLE: 3/5 HF, KE, ADF  Gen NAD. Vital signs reviewed.    Assessment/Plan: 1. Functional deficits secondary to Right hemiparesis Left pontine infarct which require 3+ hours per day  of interdisciplinary therapy in a comprehensive inpatient rehab setting. Physiatrist is providing close team supervision and 24 hour management of active medical problems listed below. Physiatrist and rehab team continue to assess barriers to discharge/monitor patient progress toward functional and medical goals. FIM: Function - Bathing Bathing activity did not occur: Refused Position: Shower Body parts bathed by patient: Chest, Abdomen, Front perineal area, Buttocks, Right upper leg, Left upper leg, Left lower leg, Right lower leg, Right arm, Left arm(used wash mit) Body parts bathed by helper: Left arm, Back Assist Level: Touching or steadying assistance(Pt > 75%)  Function- Upper Body Dressing/Undressing What is the patient wearing?: Pull over shirt/dress, Bra Bra - Perfomed by patient: Thread/unthread right bra strap,  Thread/unthread left bra strap Bra - Perfomed by helper: Hook/unhook bra (pull down sports bra) Pull over shirt/dress - Perfomed by patient: Put head through opening, Thread/unthread right sleeve, Pull shirt over trunk, Thread/unthread left sleeve Pull over shirt/dress - Perfomed by helper: Thread/unthread left sleeve, Pull shirt over trunk Assist Level: (Mod A) Function - Lower Body Dressing/Undressing What is the patient wearing?: Pants, Shoes Position: Wheelchair/chair at sink Pants- Performed by patient: Thread/unthread right pants leg, Thread/unthread left pants leg, Pull pants up/down Pants- Performed by helper: Pull pants up/down Non-skid slipper socks- Performed by patient: Don/doff left sock Non-skid slipper socks- Performed by helper: Don/doff right sock Shoes - Performed by patient: Don/doff right shoe, Don/doff left shoe Shoes - Performed by helper: Fasten left, Fasten right Assist for footwear: Partial/moderate assist Assist for lower body dressing: Touching or steadying assistance (Pt > 75%)  Function - Toileting Toileting steps completed by patient: Adjust clothing prior to toileting, Performs perineal hygiene Toileting steps completed by helper: Adjust clothing prior to toileting, Performs perineal hygiene, Adjust clothing after toileting Assist level: Touching or steadying assistance (Pt.75%)  Function - Toilet Transfers Toilet transfer assistive device: Cane Assist level to toilet: Touching or steadying assistance (Pt > 75%) Assist level from toilet: Touching or steadying assistance (Pt > 75%)  Function - Chair/bed transfer Chair/bed transfer method: Ambulatory Chair/bed transfer assist level: Supervision or verbal cues Chair/bed transfer assistive device: Armrests Chair/bed transfer details: Verbal cues for precautions/safety, Verbal cues for technique  Function - Locomotion: Wheelchair Will patient use wheelchair at discharge?: No(anticipate pt to be primary  ambulator at d/c) Wheelchair activity did not occur: Safety/medical concerns Wheel 50 feet with 2 turns activity did not occur: Safety/medical concerns Wheel 150 feet activity did not occur: Safety/medical concerns Function - Locomotion: Ambulation Assistive device: Cane-quad Max distance: 150 Assist level: Touching or steadying assistance (Pt > 75%) Assist level: Touching or steadying assistance (Pt > 75%) Assist level: Touching or steadying assistance (Pt > 75%) Walk 150 feet activity did not occur: Safety/medical concerns Assist level: Touching or steadying assistance (Pt > 75%) Walk 10 feet on uneven surfaces activity did not occur: Safety/medical concerns  Function - Comprehension Comprehension: Auditory Comprehension assist level: Follows basic conversation/direction with extra time/assistive device  Function - Expression Expression: Verbal Expression assistive device: Other (Comment)(family and interpreter) Expression assist level: Expresses basic needs/ideas: With extra time/assistive device  Function - Social Interaction Social Interaction assist level: Interacts appropriately 90% of the time - Needs monitoring or encouragement for participation or interaction.  Function - Problem Solving Problem solving assist level: Solves basic 75 - 89% of the time/requires cueing 10 - 24% of the time  Function - Memory Memory assistive device: Other (Comment) Memory assist level: Recognizes or recalls 75 - 89% of the time/requires cueing  10 - 24% of the time Patient normally able to recall (first 3 days only): Current season, That he or she is in a hospital  Medical Problem list and Plan: 1. Functional deficits secondary to left pontine infarct  Continue CIR 2. DVT proph with sq lovenox 40mg  daily 3. HTN: monitor with increased activity           -norvasc, metoprolol, lopressor.            Vitals:   07/22/18 0444 07/22/18 1319  BP: 135/65 115/63  Pulse: 61 66  Resp: 17 18   Temp: 98.9 F (37.2 C) 99.1 F (37.3 C)  SpO2: 99% 98%   Controlled on 7/28 4. Pain mgt: tylenol prn 5. Mood: team to provide ego support as necessary 6. Neuropsych: pt is competent to make decisions on her own behalf 7. Diabetes type 2: SSI for covg, TID and HS.  CBG (last 3)  Recent Labs    07/21/18 2113 07/22/18 0652 07/22/18 1143  GLUCAP 108* 136* 91             -metformin 850mg  bid- was having diarrhea, holding and start low dose amaryl, increased amaryl to 2mg  daily  Labile on 7/28 8. Stroke prophylaxis with ASA and plavix 9.  Leukocytosis without fever  : Resolved 10.  Diarrhea improved on lactose-free diet  11. Acute blood loss anemia  Hemoglobin 11.4 on 7/24  Continue to monitor  LOS (Days) 9 A FACE TO FACE EVALUATION WAS PERFORMED Samantha Paul Karis Juba 07/22/2018, 2:03 PM

## 2018-07-23 ENCOUNTER — Inpatient Hospital Stay (HOSPITAL_COMMUNITY): Payer: Medicare Other

## 2018-07-23 ENCOUNTER — Inpatient Hospital Stay (HOSPITAL_COMMUNITY): Payer: Self-pay

## 2018-07-23 DIAGNOSIS — R195 Other fecal abnormalities: Secondary | ICD-10-CM

## 2018-07-23 LAB — GLUCOSE, CAPILLARY
GLUCOSE-CAPILLARY: 142 mg/dL — AB (ref 70–99)
Glucose-Capillary: 139 mg/dL — ABNORMAL HIGH (ref 70–99)
Glucose-Capillary: 100 mg/dL — ABNORMAL HIGH (ref 70–99)
Glucose-Capillary: 145 mg/dL — ABNORMAL HIGH (ref 70–99)

## 2018-07-23 NOTE — Progress Notes (Signed)
Physical Therapy Session Note  Patient Details  Name: Samantha Paul MRN: [id number] Date of Birth: May 28, 1946  Today's Date: 07/23/2018 PT Individual Time: 0826-0926 PT Individual Time Calculation (min): 60 min   Short Term Goals: Week 1:  PT Short Term Goal 1 (Week 1): Pt will ambulate 5650' in controlled environment w/ supervision PT Short Term Goal 2 (Week 1): Pt will maintain static standing w/ supervision w/ unilateral UE support PT Short Term Goal 3 (Week 1): Pt will demonstrate appropriate safety awareness w/o cues 100% of the time  PT Short Term Goal 4 (Week 1): Pt will transfer bed<>chair w/ supervision  Skilled Therapeutic Interventions/Progress Updates:    Extra time and set-up assist, pt able to don shoes EOB to prepare for therapy session. Functional transfers using QC throughout session with supervision with occasional cues for safety. NMR for balance re-training while on blue wedge and engaging in RUE coordination and strength task to manipulate and place/stack cups while reaching outside BOS requiring overall min assist to maintain balance. NMR in quadruped position including transitional movements from prone to propped on elbows and quadruped while engaging in trunk/core stabilization exercises as well as functional weightbearing through RUE and RLE performing alternating UE reaching and LE extension with cues for positioning and maintaining alignment. Initially requires min assist for transitional movement but with repetition able to achieve with supervision. Dynamic gait through obstacle course with single limb stance and coordination activity to tap cone with RLE and then step over cone to focus on hip flexion and ankle dorsiflexion with min assist for balance. Gait training with QC to and from therapy gym x 150' with close supervision to occasional steadying assist as distance increased and pt fatigued, noted to have mild LOB and narrowing BOS or staggered gait pattern. Education  provided on importance of self monitoring clearance and positioning of R foot during gait.   Medical interpreter present throughout session.   Therapy Documentation Precautions:  Precautions Precautions: Fall Precaution Comments: right hemiparesis, R inattention Restrictions Weight Bearing Restrictions: No  Pain:  Denies pain.    See Function Navigator for Current Functional Status.   Therapy/Group: Individual Therapy   Philip AspenAlison B. Ornella Coderre, PT, DPT  07/23/2018, 9:30 AM

## 2018-07-23 NOTE — Progress Notes (Signed)
Subjective/Complaints: Patient seen lying in bed this morning. Husband at bedside with limited [place] proficiency translates. She slept well overnight. She notes one loose stool this AM.  ROS- +1 loose stool. limited due to language  Objective: Vital Signs: Blood pressure 121/64, pulse (!) 57, temperature 98 F (36.7 C), temperature source Oral, resp. rate 16, height 4\' 10"  (1.473 m), weight 56.2 kg (123 lb 14.9 oz), SpO2 99 %. No results found. Results for orders placed or performed during the hospital encounter of 07/13/18 (from the past 72 hour(s))  Glucose, capillary     Status: Abnormal   Collection Time: 07/20/18 11:39 AM  Result Value Ref Range   Glucose-Capillary 101 (H) 70 - 99 mg/dL   Comment 1 Notify RN   Glucose, capillary     Status: Abnormal   Collection Time: 07/20/18  4:58 PM  Result Value Ref Range   Glucose-Capillary 218 (H) 70 - 99 mg/dL   Comment 1 Notify RN   Glucose, capillary     Status: Abnormal   Collection Time: 07/20/18  9:25 PM  Result Value Ref Range   Glucose-Capillary 121 (H) 70 - 99 mg/dL   Comment 1 Notify RN   Glucose, capillary     Status: Abnormal   Collection Time: 07/21/18  7:05 AM  Result Value Ref Range   Glucose-Capillary 141 (H) 70 - 99 mg/dL  Glucose, capillary     Status: Abnormal   Collection Time: 07/21/18 11:51 AM  Result Value Ref Range   Glucose-Capillary 132 (H) 70 - 99 mg/dL   Comment 1 Notify RN   Glucose, capillary     Status: Abnormal   Collection Time: 07/21/18  4:41 PM  Result Value Ref Range   Glucose-Capillary 209 (H) 70 - 99 mg/dL   Comment 1 Notify RN   Glucose, capillary     Status: Abnormal   Collection Time: 07/21/18  9:13 PM  Result Value Ref Range   Glucose-Capillary 108 (H) 70 - 99 mg/dL   Comment 1 Notify RN   Glucose, capillary     Status: Abnormal   Collection Time: 07/22/18  6:52 AM  Result Value Ref Range   Glucose-Capillary 136 (H) 70 - 99 mg/dL   Comment 1 Notify RN   Glucose, capillary      Status: None   Collection Time: 07/22/18 11:43 AM  Result Value Ref Range   Glucose-Capillary 91 70 - 99 mg/dL  Glucose, capillary     Status: Abnormal   Collection Time: 07/22/18  4:41 PM  Result Value Ref Range   Glucose-Capillary 130 (H) 70 - 99 mg/dL  Glucose, capillary     Status: Abnormal   Collection Time: 07/22/18  9:23 PM  Result Value Ref Range   Glucose-Capillary 156 (H) 70 - 99 mg/dL  Glucose, capillary     Status: Abnormal   Collection Time: 07/23/18  6:57 AM  Result Value Ref Range   Glucose-Capillary 139 (H) 70 - 99 mg/dL     Constitutional: No distress . Vital signs reviewed. HENT: Normocephalic.  Atraumatic. Eyes: EOMI. No discharge. Cardiovascular: RRR. No JVD. Respiratory: CTA bilaterally. Normal effort. GI: BS +. Non-distended. Musc: No edema or tenderness in extremities. Skin:   Intact. Warm and dry Neuro: Alert Motor: RUE: 2+/5 proximal to distal RLE: 3/5 HF, KE, ADF  Gen NAD. Vital signs reviewed.    Assessment/Plan: 1. Functional deficits secondary to Right hemiparesis Left pontine infarct which require 3+ hours per day of interdisciplinary therapy in a  comprehensive inpatient rehab setting. Physiatrist is providing close team supervision and 24 hour management of active medical problems listed below. Physiatrist and rehab team continue to assess barriers to discharge/monitor patient progress toward functional and medical goals. FIM: Function - Bathing Bathing activity did not occur: Refused Position: Shower Body parts bathed by patient: Chest, Abdomen, Front perineal area, Buttocks, Right upper leg, Left upper leg, Left lower leg, Right lower leg, Right arm, Left arm(used wash mit) Body parts bathed by helper: Left arm, Back Assist Level: Touching or steadying assistance(Pt > 75%)  Function- Upper Body Dressing/Undressing What is the patient wearing?: Pull over shirt/dress, Bra Bra - Perfomed by patient: Thread/unthread right bra strap,  Thread/unthread left bra strap Bra - Perfomed by helper: Hook/unhook bra (pull down sports bra) Pull over shirt/dress - Perfomed by patient: Put head through opening, Thread/unthread right sleeve, Pull shirt over trunk, Thread/unthread left sleeve Pull over shirt/dress - Perfomed by helper: Thread/unthread left sleeve, Pull shirt over trunk Assist Level: (Mod A) Function - Lower Body Dressing/Undressing What is the patient wearing?: Pants, Shoes Position: Wheelchair/chair at sink Pants- Performed by patient: Thread/unthread right pants leg, Thread/unthread left pants leg, Pull pants up/down Pants- Performed by helper: Pull pants up/down Non-skid slipper socks- Performed by patient: Don/doff left sock Non-skid slipper socks- Performed by helper: Don/doff right sock Shoes - Performed by patient: Don/doff right shoe, Don/doff left shoe Shoes - Performed by helper: Fasten left, Fasten right Assist for footwear: Partial/moderate assist Assist for lower body dressing: Touching or steadying assistance (Pt > 75%)  Function - Toileting Toileting steps completed by patient: Adjust clothing prior to toileting, Performs perineal hygiene Toileting steps completed by helper: Adjust clothing prior to toileting, Performs perineal hygiene, Adjust clothing after toileting Assist level: Touching or steadying assistance (Pt.75%)  Function - Toilet Transfers Toilet transfer assistive device: Cane Assist level to toilet: Touching or steadying assistance (Pt > 75%) Assist level from toilet: Touching or steadying assistance (Pt > 75%)  Function - Chair/bed transfer Chair/bed transfer method: Ambulatory Chair/bed transfer assist level: Touching or steadying assistance (Pt > 75%) Chair/bed transfer assistive device: Armrests Chair/bed transfer details: Verbal cues for precautions/safety, Verbal cues for technique  Function - Locomotion: Wheelchair Will patient use wheelchair at discharge?: No(anticipate pt  to be primary ambulator at d/c) Wheelchair activity did not occur: Safety/medical concerns Wheel 50 feet with 2 turns activity did not occur: Safety/medical concerns Wheel 150 feet activity did not occur: Safety/medical concerns Function - Locomotion: Ambulation Assistive device: Cane-quad Max distance: 150 Assist level: Touching or steadying assistance (Pt > 75%) Assist level: Touching or steadying assistance (Pt > 75%) Assist level: Touching or steadying assistance (Pt > 75%) Walk 150 feet activity did not occur: Safety/medical concerns Assist level: Touching or steadying assistance (Pt > 75%) Walk 10 feet on uneven surfaces activity did not occur: Safety/medical concerns  Function - Comprehension Comprehension: Auditory Comprehension assist level: Follows basic conversation/direction with extra time/assistive device  Function - Expression Expression: Verbal Expression assistive device: Other (Comment)(family and interpreter) Expression assist level: Expresses basic needs/ideas: With extra time/assistive device  Function - Social Interaction Social Interaction assist level: Interacts appropriately 90% of the time - Needs monitoring or encouragement for participation or interaction.  Function - Problem Solving Problem solving assist level: Solves basic 75 - 89% of the time/requires cueing 10 - 24% of the time  Function - Memory Memory assistive device: Other (Comment) Memory assist level: Recognizes or recalls 75 - 89% of the time/requires cueing 10 -  24% of the time Patient normally able to recall (first 3 days only): That he or she is in a hospital  Medical Problem list and Plan: 1. Functional deficits secondary to left pontine infarct  Continue CIR 2. DVT proph with sq lovenox 40mg  daily 3. HTN: monitor with increased activity           -norvasc, metoprolol, lopressor.            Vitals:   07/22/18 2009 07/23/18 0501  BP: 126/62 121/64  Pulse: 68 (!) 57  Resp: 19 16   Temp: 98.7 F (37.1 C) 98 F (36.7 C)  SpO2: 97% 99%   Controlled on 7/29 4. Pain mgt: tylenol prn 5. Mood: team to provide ego support as necessary 6. Neuropsych: pt is competent to make decisions on her own behalf 7. Diabetes type 2: SSI for covg, TID and HS.  CBG (last 3)  Recent Labs    07/22/18 1641 07/22/18 2123 07/23/18 0657  GLUCAP 130* 156* 139*             -metformin 850mg  bid- was having diarrhea, holding and start low dose amaryl, increased amaryl to 2mg  daily  Slightly labile and 7/29 8. Stroke prophylaxis with ASA and plavix 9.  Leukocytosis without fever: Resolved 10.  Diarrhea improved on lactose-free diet   Monitor for recurrence 11. Acute blood loss anemia  Hemoglobin 11.4 on 7/25  Continue to monitor  LOS (Days) 10 A FACE TO FACE EVALUATION WAS PERFORMED Lennis Rader Karis Juba 07/23/2018, 9:29 AM

## 2018-07-23 NOTE — Progress Notes (Signed)
Physical Therapy Weekly Progress Note  Patient Details  Name: Samantha Paul MRN: [id number] Date of Birth: 10-03-1946  Beginning of progress report period: July 14, 2018 End of progress report period: July 23, 2018  Patient has met 4 of 4 short term goals. Pt has made great progress over the last week, continues to demonstrate deficits in higher level balance. She also fatigues quickly with ambulation, walking in bouts of 100-176f before requiring seated rest break and will likely require a wheelchair for community access.   Patient continues to demonstrate the following deficits muscle weakness, impaired timing and sequencing, decreased coordination and decreased motor planning, decreased initiation, decreased attention and decreased awareness and decreased standing balance, decreased postural control, hemiplegia and decreased balance strategies and therefore will continue to benefit from skilled PT intervention to increase functional independence with mobility.  Patient progressing toward long term goals..  Continue plan of care.  PT Short Term Goals Week 1:  PT Short Term Goal 1 (Week 1): Pt will ambulate 568 in controlled environment w/ supervision PT Short Term Goal 1 - Progress (Week 1): Met PT Short Term Goal 2 (Week 1): Pt will maintain static standing w/ supervision w/ unilateral UE support PT Short Term Goal 2 - Progress (Week 1): Met PT Short Term Goal 3 (Week 1): Pt will demonstrate appropriate safety awareness w/o cues 100% of the time  PT Short Term Goal 3 - Progress (Week 1): Met PT Short Term Goal 4 (Week 1): Pt will transfer bed<>chair w/ supervision PT Short Term Goal 4 - Progress (Week 1): Met Week 2:  PT Short Term Goal 1 (Week 2): LTG=STG due to ELOS  Skilled Therapeutic Interventions/Progress Updates:  Ambulation/gait training;Disease management/prevention;Pain management;Stair training;Visual/perceptual remediation/compensation;Wheelchair  propulsion/positioning;Therapeutic Activities;Patient/family education;DME/adaptive equipment instruction;Balance/vestibular training;Cognitive remediation/compensation;Functional electrical stimulation;Psychosocial support;Therapeutic Exercise;UE/LE Strength taining/ROM;Skin care/wound management;Functional mobility training;Community reintegration;Discharge planning;Neuromuscular re-education;Splinting/orthotics;UE/LE Coordination activities   Therapy Documentation Precautions:  Precautions Precautions: Fall Precaution Comments: right hemiparesis, R inattention Restrictions Weight Bearing Restrictions: No   See Function Navigator for Current Functional Status.  Therapy/Group: Individual Therapy  ENetta Corrigan PT, DPT 07/23/2018, 10:44 AM

## 2018-07-23 NOTE — Progress Notes (Signed)
Physical Therapy Session Note  Patient Details  Name: Samantha GuttingHpot Paul MRN: [id number] Date of Birth: 04-27-1946  Today's Date: 07/23/2018 PT Individual Time: 1000-1055 PT Individual Time Calculation (min): 55 min   Short Term Goals: Week 1:  PT Short Term Goal 1 (Week 1): Pt will ambulate 5150' in controlled environment w/ supervision PT Short Term Goal 2 (Week 1): Pt will maintain static standing w/ supervision w/ unilateral UE support PT Short Term Goal 3 (Week 1): Pt will demonstrate appropriate safety awareness w/o cues 100% of the time  PT Short Term Goal 4 (Week 1): Pt will transfer bed<>chair w/ supervision  Skilled Therapeutic Interventions/Progress Updates:    Pt supine in bed upon PT arrival, agreeable to therapy tx and denies pain. Pt transferred to sitting with supervision and ambulated x 150 ft to the gym with QC and close supervision. Pt ambulated to ortho gym with QC and close supervision x 100 ft, performed car transfer with QC and supervision. Pt requires CGA for ambulation back to gym x 100 ft as she becomes fatigue. Pt worked on dynamic standing balance without AD in order to navigate obstacle course, stepping over objects, stepping on/off airex and weaving between cones, CGA, x 2 trials. Pt performed sidestepping in each direction 4 x 20 ft for dynamic balance and hip strengthening, with B HHA. Pt worked on dynamic balance and R UE fine motor function in order to ambulate through hallway without AD collecting objects off walls and off the ground. Pt worked on dynamic balance and UE coordination to toss ball and pick ball up off the floor while standing on airex. Pt ambulated back to room and left seated with needs in reach and family present.   Therapy Documentation Precautions:  Precautions Precautions: Fall Precaution Comments: right hemiparesis, R inattention Restrictions Weight Bearing Restrictions: No   See Function Navigator for Current Functional  Status.   Therapy/Group: Individual Therapy  Cresenciano GenreEmily van Schagen , PT, DPT 07/23/2018, 7:43 AM

## 2018-07-23 NOTE — Progress Notes (Signed)
Occupational Therapy Session Note  Patient Details  Name: Samantha Paul MRN: [id number] Date of Birth: March 16, 1946  Today's Date: 07/23/2018 OT Individual Time: 1300-1425 OT Individual Time Calculation (min): 85 min    Short Term Goals: Week 1:  OT Short Term Goal 1 (Week 1): Pt will require no more than 1 vc for attention to R side of meal tray OT Short Term Goal 2 (Week 1): Pt [facility] OT Short Term Goal 3 (Week 1): Pt will don shirt with min A OT Short Term Goal 4 (Week 1): Pt will thread pants over B LE with min A  Skilled Therapeutic Interventions/Progress Updates:    OT intervention with focus on RUE functional use to increase independence with BADLs.  Pt sitting EOB upon arrival and requested to remain in room secondary to stomach "upset." Pt engaged in RUE therapeutic activities sitting EOB. Activities included picking up bean bags, large wooden pegs and placing in board, colored pegs and placing in peg board to replicate pattern, removing and replacing small beads in super soft putty, and folding wash cloths with BUE use.  Pt with improved functional use of RUE and currently using RUE to assist during functional tasks.  Pt continues to exhibit compensatory strategies during tasks.  Pt remained seated EOB with family present.   Therapy Documentation Precautions:  Precautions Precautions: Fall Precaution Comments: right hemiparesis, R inattention Restrictions Weight Bearing Restrictions: No Pain:   See Function Navigator for Current Functional Status.   Therapy/Group: Individual Therapy  Rich BraveLanier, Baldo Hufnagle Chappell 07/23/2018, 2:31 PM

## 2018-07-24 ENCOUNTER — Inpatient Hospital Stay (HOSPITAL_COMMUNITY): Payer: Medicare Other

## 2018-07-24 ENCOUNTER — Inpatient Hospital Stay (HOSPITAL_COMMUNITY): Payer: Self-pay

## 2018-07-24 DIAGNOSIS — I69351 Hemiplegia and hemiparesis following cerebral infarction affecting right dominant side: Secondary | ICD-10-CM

## 2018-07-24 LAB — GLUCOSE, CAPILLARY
GLUCOSE-CAPILLARY: 121 mg/dL — AB (ref 70–99)
GLUCOSE-CAPILLARY: 134 mg/dL — AB (ref 70–99)
GLUCOSE-CAPILLARY: 212 mg/dL — AB (ref 70–99)
Glucose-Capillary: 129 mg/dL — ABNORMAL HIGH (ref 70–99)

## 2018-07-24 NOTE — Progress Notes (Signed)
Occupational Therapy Discharge Summary  Patient Details  Name: Samantha Paul MRN: [id number] Date of Birth: 11-29-1946   Patient has met 37 of 10 long term goals due to improved activity tolerance, improved balance, postural control, ability to compensate for deficits, functional use of  RIGHT upper and RIGHT lower extremity and improved coordination.  Pt made steady progress with BADL during this admission.  Pt completes bathing/dressing and toileting tasks at supervision level.  Pt performs functional transfers at supervision level and requires min A for tub transfers.  Pt RUE continues to demonstrate improved function and pt initiates use during functional tasks.  Pt currently uses her RUE at diminished level.  Pt issued handouts for RUE therex and theraputty activities.  Pt's husbnad has been present for therapy and provides appropriate level of supervision/assistance. Patient to discharge at overall Supervision level.  Patient's care partner is independent to provide the necessary physical assistance at discharge.     Recommendation:  Patient will benefit from ongoing skilled OT services in home health setting to continue to advance functional skills in the area of BADL, iADL and Reduce care partner burden.  Equipment: tub transfer bench  Reasons for discharge: treatment goals met and discharge from hospital  Patient/family agrees with progress made and goals achieved: Yes  OT Discharge  Vision Baseline Vision/History: Wears glasses Wears Glasses: Reading only Patient Visual Report: No change from baseline Vision Assessment?: No apparent visual deficits Perception  Perception: Impaired Inattention/Neglect: Does not attend to right visual field;Does not attend to right side of body Praxis Praxis: Intact Cognition Arousal/Alertness: Awake/alert Orientation Level: Oriented X4 Memory: Impaired Memory Impairment: Decreased recall of new information Awareness: Impaired Awareness  Impairment: Emergent impairment Problem Solving: Impaired Problem Solving Impairment: Functional basic Safety/Judgment: Impaired Comments: Mildly impulsive Sensation Sensation Light Touch: Appears Intact Hot/Cold: Appears Intact Proprioception: Appears Intact Stereognosis: Not tested Coordination Gross Motor Movements are Fluid and Coordinated: No Fine Motor Movements are Fluid and Coordinated: No Coordination and Movement Description: RUE hemiparesis Motor  Motor Motor: Hemiplegia   Extremity/Trunk Assessment RUE Assessment RUE Body System: Neuro Brunstrum levels for arm and hand: Hand;Arm Brunstrum level for arm: Stage V Brunstrum level for hand: Stage VI LUE Assessment LUE Assessment: Within Functional Limits   See Function Navigator for Current Functional Status.  Leotis Shames [facility] 07/24/2018, 8:46 AM

## 2018-07-24 NOTE — Progress Notes (Signed)
Occupational Therapy Session Note  Patient Details  Name: Samantha GuttingHpot Paul MRN: [id number] Date of Birth: Mar 20, 1946  Today's Date: 07/24/2018 OT Individual Time: 0830-0930 OT Individual Time Calculation (min): 60 min    Short Term Goals: Week 1:  OT Short Term Goal 1 (Week 1): Pt will require no more than 1 vc for attention to R side of meal tray OT Short Term Goal 2 (Week 1): Pt [facility] OT Short Term Goal 3 (Week 1): Pt will don shirt with min A OT Short Term Goal 4 (Week 1): Pt will thread pants over B LE with min A  Skilled Therapeutic Interventions/Progress Updates:    Pt sitting EOB upon arrival and interpreter present.  Pt already dressed this morning.  Upon questioning, pt and husband stated that pt was able to complete bathing at shower level and dressing this morning without physical assistance (supervision). OT intervention with focus on continued family education, functional transfers, functional amb with LBQC, RUE therapeutic activities/tasks, continued discharge planning, and safety awareness. Pt amb with LBQC to ADL tub room to practice tub bench transfers with min A.  Pt also practiced bed and furniture transfers with supervision.  Pt transitioned to gym for table activities with RUE.  Pt issued theraputty and handout for continued R hand activities.  Pt's husband present for education. HHOT recommended. Pt returned to room and remained seated EOB with husband present.   Therapy Documentation Precautions:  Precautions Precautions: Fall Precaution Comments: right hemiparesis, R inattention Restrictions Weight Bearing Restrictions: No Pain:  Pt c/o RUE/shoulder soreness after activity  See Function Navigator for Current Functional Status.   Therapy/Group: Individual Therapy  Rich BraveLanier, Kayton Ripp Chappell 07/24/2018, 10:27 AM

## 2018-07-24 NOTE — Progress Notes (Signed)
Subjective/Complaints: Patient seen sitting up in bed this morning. Husband at bedside, limited [place] proficiency, translates. Patient slept well overnight. Immediately when I walked in the room, patient shows me that she is able to lift her right upper extremity. Husband with questions regarding discharge date.  ROS- Limited due to language  Objective: Vital Signs: Blood pressure 136/68, pulse 62, temperature 99.1 F (37.3 C), temperature source Oral, resp. rate 15, height 4\' 10"  (1.473 m), weight 56.2 kg (123 lb 14.9 oz), SpO2 100 %. No results found. Results for orders placed or performed during the hospital encounter of 07/13/18 (from the past 72 hour(s))  Glucose, capillary     Status: Abnormal   Collection Time: 07/21/18 11:51 AM  Result Value Ref Range   Glucose-Capillary 132 (H) 70 - 99 mg/dL   Comment 1 Notify RN   Glucose, capillary     Status: Abnormal   Collection Time: 07/21/18  4:41 PM  Result Value Ref Range   Glucose-Capillary 209 (H) 70 - 99 mg/dL   Comment 1 Notify RN   Glucose, capillary     Status: Abnormal   Collection Time: 07/21/18  9:13 PM  Result Value Ref Range   Glucose-Capillary 108 (H) 70 - 99 mg/dL   Comment 1 Notify RN   Glucose, capillary     Status: Abnormal   Collection Time: 07/22/18  6:52 AM  Result Value Ref Range   Glucose-Capillary 136 (H) 70 - 99 mg/dL   Comment 1 Notify RN   Glucose, capillary     Status: None   Collection Time: 07/22/18 11:43 AM  Result Value Ref Range   Glucose-Capillary 91 70 - 99 mg/dL  Glucose, capillary     Status: Abnormal   Collection Time: 07/22/18  4:41 PM  Result Value Ref Range   Glucose-Capillary 130 (H) 70 - 99 mg/dL  Glucose, capillary     Status: Abnormal   Collection Time: 07/22/18  9:23 PM  Result Value Ref Range   Glucose-Capillary 156 (H) 70 - 99 mg/dL  Glucose, capillary     Status: Abnormal   Collection Time: 07/23/18  6:57 AM  Result Value Ref Range   Glucose-Capillary 139 (H) 70 - 99  mg/dL  Glucose, capillary     Status: Abnormal   Collection Time: 07/23/18 11:37 AM  Result Value Ref Range   Glucose-Capillary 100 (H) 70 - 99 mg/dL  Glucose, capillary     Status: Abnormal   Collection Time: 07/23/18  4:31 PM  Result Value Ref Range   Glucose-Capillary 145 (H) 70 - 99 mg/dL  Glucose, capillary     Status: Abnormal   Collection Time: 07/23/18  9:31 PM  Result Value Ref Range   Glucose-Capillary 142 (H) 70 - 99 mg/dL  Glucose, capillary     Status: Abnormal   Collection Time: 07/24/18  6:19 AM  Result Value Ref Range   Glucose-Capillary 129 (H) 70 - 99 mg/dL     Constitutional: No distress . Vital signs reviewed. HENT: Normocephalic.  Atraumatic. Eyes: EOMI. No discharge. Cardiovascular: RRR. No JVD. Respiratory: CTA bilateral. Normal effort. GI: BS +. Non-distended. Musc: No edema or tenderness in extremities. Skin:   Intact. Warm and dry Neuro: Alert Motor: RUE: 3+-4 -/5 proximal to distal RLE: 3+-4 -/5 HF, KE, ADF  Gen NAD. Vital signs reviewed.    Assessment/Plan: 1. Functional deficits secondary to Right hemiparesis Left pontine infarct which require 3+ hours per day of interdisciplinary therapy in a comprehensive inpatient rehab setting.  Physiatrist is providing close team supervision and 24 hour management of active medical problems listed below. Physiatrist and rehab team continue to assess barriers to discharge/monitor patient progress toward functional and medical goals. FIM: Function - Bathing Bathing activity did not occur: Refused Position: Shower Body parts bathed by patient: Chest, Abdomen, Front perineal area, Buttocks, Right upper leg, Left upper leg, Left lower leg, Right lower leg, Right arm, Left arm(used wash mit) Body parts bathed by helper: Left arm, Back Assist Level: Touching or steadying assistance(Pt > 75%)  Function- Upper Body Dressing/Undressing What is the patient wearing?: Pull over shirt/dress, Bra Bra - Perfomed by  patient: Thread/unthread right bra strap, Thread/unthread left bra strap Bra - Perfomed by helper: Hook/unhook bra (pull down sports bra) Pull over shirt/dress - Perfomed by patient: Put head through opening, Thread/unthread right sleeve, Pull shirt over trunk, Thread/unthread left sleeve Pull over shirt/dress - Perfomed by helper: Thread/unthread left sleeve, Pull shirt over trunk Assist Level: (Mod A) Function - Lower Body Dressing/Undressing What is the patient wearing?: Pants, Shoes Position: Wheelchair/chair at sink Pants- Performed by patient: Thread/unthread right pants leg, Thread/unthread left pants leg, Pull pants up/down Pants- Performed by helper: Pull pants up/down Non-skid slipper socks- Performed by patient: Don/doff left sock Non-skid slipper socks- Performed by helper: Don/doff right sock Shoes - Performed by patient: Don/doff right shoe, Don/doff left shoe Shoes - Performed by helper: Fasten left, Fasten right Assist for footwear: Setup Assist for lower body dressing: Touching or steadying assistance (Pt > 75%)  Function - Toileting Toileting steps completed by patient: Adjust clothing prior to toileting, Performs perineal hygiene Toileting steps completed by helper: Adjust clothing prior to toileting, Performs perineal hygiene, Adjust clothing after toileting Assist level: Touching or steadying assistance (Pt.75%)  Function - Toilet Transfers Toilet transfer assistive device: Cane Assist level to toilet: Touching or steadying assistance (Pt > 75%) Assist level from toilet: Touching or steadying assistance (Pt > 75%)  Function - Chair/bed transfer Chair/bed transfer method: Stand pivot Chair/bed transfer assist level: Supervision or verbal cues Chair/bed transfer assistive device: Armrests, Cane Chair/bed transfer details: Verbal cues for precautions/safety, Verbal cues for technique  Function - Locomotion: Wheelchair Will patient use wheelchair at discharge?:  No(anticipate pt to be primary ambulator at d/c) Wheelchair activity did not occur: Safety/medical concerns Wheel 50 feet with 2 turns activity did not occur: Safety/medical concerns Wheel 150 feet activity did not occur: Safety/medical concerns Function - Locomotion: Ambulation Assistive device: Cane-quad Max distance: 150 Assist level: Supervision or verbal cues Assist level: Supervision or verbal cues Assist level: Supervision or verbal cues Walk 150 feet activity did not occur: Safety/medical concerns Assist level: Supervision or verbal cues Walk 10 feet on uneven surfaces activity did not occur: Safety/medical concerns  Function - Comprehension Comprehension: Auditory Comprehension assist level: Follows basic conversation/direction with extra time/assistive device  Function - Expression Expression: Verbal Expression assistive device: Other (Comment)(family and interpreter) Expression assist level: Expresses basic needs/ideas: With extra time/assistive device  Function - Social Interaction Social Interaction assist level: Interacts appropriately 90% of the time - Needs monitoring or encouragement for participation or interaction.  Function - Problem Solving Problem solving assist level: Solves basic 75 - 89% of the time/requires cueing 10 - 24% of the time  Function - Memory Memory assistive device: Other (Comment) Memory assist level: Recognizes or recalls 75 - 89% of the time/requires cueing 10 - 24% of the time Patient normally able to recall (first 3 days only): That he or she is  in a hospital  Medical Problem list and Plan: 1. Functional deficits secondary to left pontine infarct  Continue CIR 2. DVT proph with sq lovenox 40mg  daily 3. HTN: monitor with increased activity           -norvasc, metoprolol, lopressor.            Vitals:   07/23/18 2118 07/24/18 0640  BP: 139/63 136/68  Pulse: 63 62  Resp:  15  Temp: 99.8 F (37.7 C) 99.1 F (37.3 C)  SpO2: 99%  100%   Overall controlled on 7/30 4. Pain mgt: tylenol prn 5. Mood: team to provide ego support as necessary 6. Neuropsych: pt is competent to make decisions on her own behalf 7. Diabetes type 2: SSI for covg, TID and HS.  CBG (last 3)  Recent Labs    07/23/18 1631 07/23/18 2131 07/24/18 0619  GLUCAP 145* 142* 129*             -metformin 850mg  bid- was having diarrhea, holding and start low dose amaryl, increased amaryl to 2mg  daily  Slightly labile, but overall controlled on 7/30 8. Stroke prophylaxis with ASA and plavix 9.  Leukocytosis without fever: Resolved 10.  Diarrhea improved on lactose-free diet   Monitor for recurrence 11. Acute blood loss anemia  Hemoglobin 11.4 on 7/25  Continue to monitor  LOS (Days) 11 A FACE TO FACE EVALUATION WAS PERFORMED Orestes Geiman Karis Juba 07/24/2018, 8:58 AM

## 2018-07-24 NOTE — Discharge Summary (Signed)
Physician Discharge Summary  Patient ID: Samantha Paul MRN: [id number] DOB/AGE: Jun 11, 1946 72 y.o.  Admit date: 07/13/2018 Discharge date: 07/25/2018  Discharge Diagnoses:  Principal Problem:   Left pontine stroke [facility]) Active Problems:   Essential hypertension   Diabetes mellitus type 2 in nonobese (HCC)   Acute blood loss anemia   Hemiparesis affecting right side as late effect of stroke [facility])   Discharged Condition: stable   Significant Diagnostic Studies: N/A   Labs:  Basic Metabolic Panel: BMP Latest Ref Rng & Units 07/19/2018 07/14/2018 07/10/2018  Glucose 70 - 99 mg/dL 138(H) 179(H) 211(H)  BUN 8 - 23 mg/dL 28(H) 11 10  Creatinine 0.44 - 1.00 mg/dL 0.96 0.90 0.87  Sodium 135 - 145 mmol/L 141 140 142  Potassium 3.5 - 5.1 mmol/L 4.0 4.2 3.7  Chloride 98 - 111 mmol/L 108 106 106  CO2 22 - 32 mmol/L 24 27 26   Calcium 8.9 - 10.3 mg/dL 9.1 9.2 8.8(L)    CBC: CBC Latest Ref Rng & Units 07/19/2018 07/14/2018 07/10/2018  WBC 4.0 - 10.5 K/uL 10.2 12.0(H) 9.8  Hemoglobin 12.0 - 15.0 g/dL 11.4(L) 12.0 12.5  Hematocrit 36.0 - 46.0 % 39.3 41.2 42.6  Platelets 150 - 400 K/uL 196 207 174    CBG: Recent Labs  Lab 07/24/18 0619 07/24/18 1136 07/24/18 1634 07/24/18 2207 07/25/18 0624  GLUCAP 129* 121* 134* 212* 121*    Brief HPI:   Samantha Paul is a 72 year old RH female with history of untreated HTN who was admitted on 07/09/2018 with 2-day history of right-sided weakness, slurred speech and difficulty walking.  Blood pressure at admission was 193/89 and MRI brain done revealing 1 cm acute left paramedian pontine infarct.  CT head/neck was negative for flow-limiting stenosis.  2D echo showed EF of 65 to 70% with moderate focal basal hypertrophy and moderate proximal septal thickening.  Dr. Erlinda Hong felt that stroke was due to small vessel disease and recommended DAPT x3 weeks followed by aspirin or Plavix alone.  Patient with new diagnosis of diabetes with hemoglobin A1c of 12.3.  Patient  continued to be limited by right-sided weakness with balance deficits.  CIR was recommended due to functional decline.   Hospital Course: Samantha Paul was admitted to rehab 07/13/2018 for inpatient therapies to consist of PT and OT at least three hours five days a week. Past admission physiatrist, therapy team and rehab RN have worked together to provide customized collaborative inpatient rehab. She was maintained on ASA/Plavix and is tolerating this without SE.  Follow up CBC showed that reactive leucocytosis has resolved and mild drop in H/H noted. She was transitioned to metformin but was unable to tolerate this due to abdominal pain and diarrhea. She was started on low dose Amaryl and BS have been monitored on ac/hs basis. Family has been educated on importance of CM diet and she is to follow up with primary MD for further titration in medications. Blood pressures have been monitored on bid basis and have been controlled on current regimen. Mood has been stable and she is continent of bowel and bladder. She has made good gains during her rehab stay and has progressed to supervision level. She will continue to receive follow up HHPT and [place] [facility] after discharge.    Rehab course: During patient's stay in rehab weekly team conferences were held to monitor patient's progress, set goals and discuss barriers to discharge. At admission, patient required mod assist with basic self care tasks  and min assist with mobility. She  has had improvement in activity tolerance, balance, postural control as well as ability to compensate for deficits. She has had improvement in functional use RUE  and RLE as well as improvement in awareness. She is able to complete ADL tasks and requires min assist for tub transfers. She is able to perform transfers at modified independent level and is able to ambulate 150' with [facility].  Husband has been present for most therapy sessions and has been educated in all aspects of care  and safety.     Disposition:  Home   Diet: Diabetic/ Low fat/Low cholesterol   Special Instructions: 1. Stop using aspirin after August 7th.  2. No strenuous activity. Needs supervision with mobility.  3. Will need follow up CBC/ BMET in 1-2 weeks after discharge.  4. Will need cardiology referral to evaluate question of HOCM on echo.    Discharge Instructions    Ambulatory referral to Physical Medicine Rehab   Complete by:  As directed    1-2 WEEKS TRANSITIONAL CARE APPT     Allergies as of 07/25/2018      Reactions   Metformin And Related    Abdominal pain and diarrhea      Medication List    STOP taking these medications   enoxaparin 40 MG/0.4ML injection Commonly known as:  LOVENOX   insulin aspart 100 UNIT/ML injection Commonly known as:  novoLOG   insulin glargine 100 UNIT/ML injection Commonly known as:  LANTUS   metFORMIN 850 MG tablet Commonly known as:  GLUCOPHAGE     TAKE these medications   acetaminophen 325 MG tablet Commonly known as:  TYLENOL Take 1-2 tablets (325-650 mg total) by mouth every 4 (four) hours as needed for mild pain. What changed:    how much to take  reasons to take this   amLODipine 10 MG tablet Commonly known as:  NORVASC Take 1 tablet (10 mg total) by mouth daily.   aspirin 81 MG EC tablet Take 1 tablet (81 mg total) by mouth daily. With food   atorvastatin 40 MG tablet Commonly known as:  LIPITOR Take 1 tablet (40 mg total) by mouth daily at 6 PM.   blood glucose meter kit and supplies Kit Dispense based on patient and insurance preference. Use up to four times daily as directed. (FOR ICD-10--E11.9).   clopidogrel 75 MG tablet Commonly known as:  PLAVIX Take 1 tablet (75 mg total) by mouth daily.   glimepiride 1 MG tablet Commonly known as:  AMARYL Take 1 tablet (1 mg total) by mouth daily with breakfast.   losartan 50 MG tablet Commonly known as:  COZAAR Take 1 tablet (50 mg total) by mouth daily.    metoprolol tartrate 25 MG tablet Commonly known as:  LOPRESSOR Take 1 tablet (25 mg total) by mouth 2 (two) times daily.   senna-docusate 8.6-50 MG tablet Commonly known as:  Senokot-S Take 2 tablets by mouth at bedtime.      Follow-up Information    Elwyn Reach, MD. Go on 08/09/2018.   Specialty:  Internal Medicine Why:  12:00 PM noon Contact information: [address] [place] [address] [phone]        Charlett Blake, MD Follow up.   Specialty:  Physical Medicine and Rehabilitation Why:  Office will call you with follow up appointment Contact information: [place] [address] [phone]        [facility]. Call.   Why:  for follow up appointment Contact information: [address]     [place] [address] [phone]          Signed: Bary Leriche 07/25/2018, 4:14 PM

## 2018-07-24 NOTE — Progress Notes (Signed)
Physical Therapy Discharge Summary  Patient Details  Name: Samantha Paul MRN: [id number] Date of Birth: September 28, 1946  Today's Date: 07/24/2018 PT Individual Time: 1000-1100, 1300-1410 PT Individual Time Calculation (min): 60 min and 70 min   Patient has met 7 of 7 long term goals due to improved activity tolerance, improved balance, improved postural control, increased strength and functional use of  right upper extremity and right lower extremity.  Patient to discharge at an ambulatory level Supervision.  Patient's care partner is independent to provide the necessary supervision assistance at discharge. Pt's husband has observed multiple therapy sessions and has been educated on appropriate cues to provide pt for safety during ambulation and stairs.   All goals met.   Recommendation:  Patient will benefit from ongoing skilled PT services in home health setting to continue to advance safe functional mobility, address ongoing impairments in balance, awareness, strength, and minimize fall risk.  Equipment: quad cane and 16x16 w/c  Reasons for discharge: treatment goals met  Patient/family agrees with progress made and goals achieved: Yes   PT Treatment Intervention: Session 1: Pt supine in bed upon PT arrival, agreeable to therapy tx and denies pain. Pt transferred to sitting EOB Mod I and performs transfers Mod I. Pt ambulated from room>gym x 150 ft with QC and supervision, verbal cues for safety and device management. Pt participated in sensation, strength and coordination testing as detailed below. Pt participated in Paulina balance test as detailed below, scored 46/56 and discussed results with the pt and husband. Pt completed TUG as detailed below, 22.3 seconds and discussed these results with pt. Pt ambulated x80 ft to the steps with QC and supervision, pt ascended/descended 12 steps with supervision using B handrails and step to pattern, verbal cues for techniques and which LE to lead with. Pt  ambulated to the ortho gym with QC and supervision x 100 ft. Pt performed car transfer with supervision and QC. Pt ambulated to w/c and transported back to room. Pt transferred to bed Mod I and transferred to supine independently. Pt left supine with needs in reach.   Session 2: Pt seated in w/c upon PT arrival, agreeable to therapy tx and denies pain. Pt ambulated from room>dayroom x 150 ft with QC and supervision. Pt used nustep x 8 minutes on workload 5 using B UEs and B LEs for neuro re-ed and reciprocal movement. Pt transferred to w/c mod I. Pt transported outside. Pt worked on ambulation on unlevel surfaces while outside x 80 ft and x 120 ft with QC and supervision. Pt transported back to gym. Pt worked on dynamic standing balance and hip strengthening to performed sidestepping through ladder x 4, B HHA and verbal cues for techniques. Pt worked on dynamic balance and increased step length in order to step over rungs of floor ladder reciprocally, min assist without AD. Pt worked on dynamic standing balance without UE support, standing on airex and squatting down to pick up bean bag and then toss into bucket. Pt completed floor transfer from mat<>floor with CGA and verbal cues for techniques. Pt ambulated back to room x 200 ft with QC and supervision, left with needs in reach.     PT Discharge Precautions/Restrictions Precautions Precautions: Fall Precaution Comments: right hemiparesis, R inattention Restrictions Weight Bearing Restrictions: No Vital Signs Therapy Vitals Temp: 99.1 F (37.3 C) Temp Source: Oral Pulse Rate: 62 Resp: 15 BP: 136/68 Patient Position (if appropriate): Sitting Oxygen Therapy SpO2: 100 % O2 Device: Room Air Pain  denies  pain Vision/Perception  Perception Perception: Impaired Inattention/Neglect: Does not attend to right visual field;Does not attend to right side of body Praxis Praxis: Intact  Cognition Overall Cognitive Status: Within Functional  Limits for tasks assessed Arousal/Alertness: Awake/alert Orientation Level: Oriented X4 Attention: Sustained Sustained Attention: Appears intact Memory: Impaired Memory Impairment: Decreased recall of new information Awareness: Impaired Awareness Impairment: Emergent impairment Problem Solving: Impaired Problem Solving Impairment: Functional basic Safety/Judgment: Impaired Comments: Mildly impulsive Sensation Sensation Light Touch: Appears Intact Hot/Cold: Appears Intact Proprioception: Appears Intact Stereognosis: Not tested Additional Comments: B LE sensation appears grossly intact Coordination Gross Motor Movements are Fluid and Coordinated: No Fine Motor Movements are Fluid and Coordinated: No Coordination and Movement Description: mild incoordination noted R UE/LE along with hemiparesis Motor  Motor Motor: Hemiplegia Motor - Skilled Clinical Observations: R hemiparesis  Mobility Bed Mobility Bed Mobility: Rolling Right;Rolling Left Rolling Right: Independent Rolling Left: Independent Supine to Sit: Independent Sit to Supine: Independent Transfers Transfers: Stand Pivot Transfers;Stand to Sit;Sit to Stand Sit to Stand: Independent with assistive device Stand to Sit: Independent with assistive device Stand Pivot Transfers: Independent with assistive device Transfer (Assistive device): Small based quad cane Locomotion  Gait Ambulation: Yes Gait Assistance: Supervision/Verbal cueing Gait Distance (Feet): 150 Feet Assistive device: Small based quad cane  Trunk/Postural Assessment  Cervical Assessment Cervical Assessment: Within Functional Limits Thoracic Assessment Thoracic Assessment: Within Functional Limits Lumbar Assessment Lumbar Assessment: Within Functional Limits Postural Control Postural Control: Within Functional Limits  Balance Balance Balance Assessed: Yes Standardized Balance Assessment Standardized Balance Assessment: Berg Balance Test Berg  Balance Test Sit to Stand: Able to stand without using hands and stabilize independently Standing Unsupported: Able to stand safely 2 minutes Sitting with Back Unsupported but Feet Supported on Floor or Stool: Able to sit safely and securely 2 minutes Stand to Sit: Sits safely with minimal use of hands Transfers: Able to transfer safely, definite need of hands Standing Unsupported with Eyes Closed: Able to stand 10 seconds safely Standing Ubsupported with Feet Together: Able to place feet together independently and stand 1 minute safely From Standing, Reach Forward with Outstretched Arm: Can reach forward >12 cm safely (5") From Standing Position, Pick up Object from Floor: Able to pick up shoe, needs supervision From Standing Position, Turn to Look Behind Over each Shoulder: Looks behind one side only/other side shows less weight shift Turn 360 Degrees: Able to turn 360 degrees safely but slowly Standing Unsupported, Alternately Place Feet on Step/Stool: Able to stand independently and complete 8 steps >20 seconds Standing Unsupported, One Foot in Front: Able to plae foot ahead of the other independently and hold 30 seconds Standing on One Leg: Able to lift leg independently and hold equal to or more than 3 seconds Total Score: 46 Timed Up and Go Test TUG: Normal TUG(with QC) Normal TUG (seconds): 22.3 Static Sitting Balance Static Sitting - Level of Assistance: 6: Modified independent (Device/Increase time) Dynamic Sitting Balance Dynamic Sitting - Level of Assistance: 6: Modified independent (Device/Increase time) Static Standing Balance Static Standing - Level of Assistance: 6: Modified independent (Device/Increase time) Dynamic Standing Balance Dynamic Standing - Level of Assistance: 5: Stand by assistance Extremity Assessment  RUE Assessment RUE Body System: Neuro Brunstrum levels for arm and hand: Hand;Arm Brunstrum level for arm: Stage IV Movement is deviating from  synergy Brunstrum level for hand: Stage IV Movements deviating from synergies LUE Assessment LUE Assessment: Within Functional Limits RLE Assessment RLE Assessment: Exceptions to [facility] Passive Range of Motion (PROM) Comments: [facility]  General Strength Comments: hip flexion 3-/5, hip extension 4/5,, hip abduction 3-/5, knee extension 3-/5, knee flexion 4/5, DF 4/5 and PF 4/5 LLE Assessment LLE Assessment: Within Functional Limits   See Function Navigator for Current Functional Status.  Netta Corrigan, PT, DPT 07/24/2018, 10:29 AM

## 2018-07-25 LAB — GLUCOSE, CAPILLARY: Glucose-Capillary: 121 mg/dL — ABNORMAL HIGH (ref 70–99)

## 2018-07-25 MED ORDER — BLOOD GLUCOSE MONITOR KIT: PACK | 0 refills | Status: DC

## 2018-07-25 MED ORDER — AMLODIPINE BESYLATE 10 MG PO TABS: 10.0000 mg | ORAL_TABLET | Freq: Every day | ORAL | 0 refills | Status: DC

## 2018-07-25 MED ORDER — GLIMEPIRIDE 1 MG PO TABS: 1.0000 mg | ORAL_TABLET | Freq: Every day | ORAL | 0 refills | Status: DC

## 2018-07-25 MED ORDER — LOSARTAN POTASSIUM 50 MG PO TABS: 50.0000 mg | ORAL_TABLET | Freq: Every day | ORAL | 0 refills | Status: DC

## 2018-07-25 MED ORDER — SENNOSIDES-DOCUSATE SODIUM 8.6-50 MG PO TABS: 2.0000 | ORAL_TABLET | Freq: Every day | ORAL | 0 refills | Status: DC

## 2018-07-25 MED ORDER — CLOPIDOGREL BISULFATE 75 MG PO TABS: 75.0000 mg | ORAL_TABLET | Freq: Every day | ORAL | 0 refills | Status: DC

## 2018-07-25 MED ORDER — ATORVASTATIN CALCIUM 40 MG PO TABS: 40.0000 mg | ORAL_TABLET | Freq: Every day | ORAL | 0 refills | Status: DC

## 2018-07-25 MED ORDER — METOPROLOL TARTRATE 25 MG PO TABS: 25.0000 mg | ORAL_TABLET | Freq: Two times a day (BID) | ORAL | 0 refills | Status: DC

## 2018-07-25 MED ORDER — ACETAMINOPHEN 325 MG PO TABS: 325.0000 mg | ORAL_TABLET | ORAL | Status: DC | PRN

## 2018-07-25 NOTE — Progress Notes (Signed)
Subjective/Complaints: Patient seen sitting up at the edge of her bed this morning. Husband with limited and was proficient to translates. They are ready for discharge.  ROS- Limited due to language  Objective: Vital Signs: Blood pressure 126/68, pulse 71, temperature 98.9 F (37.2 C), temperature source Oral, resp. rate 18, height 4\' 10"  (1.473 m), weight 56.2 kg (123 lb 14.4 oz), SpO2 100 %. No results found. Results for orders placed or performed during the hospital encounter of 07/13/18 (from the past 72 hour(s))  Glucose, capillary     Status: None   Collection Time: 07/22/18 11:43 AM  Result Value Ref Range   Glucose-Capillary 91 70 - 99 mg/dL  Glucose, capillary     Status: Abnormal   Collection Time: 07/22/18  4:41 PM  Result Value Ref Range   Glucose-Capillary 130 (H) 70 - 99 mg/dL  Glucose, capillary     Status: Abnormal   Collection Time: 07/22/18  9:23 PM  Result Value Ref Range   Glucose-Capillary 156 (H) 70 - 99 mg/dL  Glucose, capillary     Status: Abnormal   Collection Time: 07/23/18  6:57 AM  Result Value Ref Range   Glucose-Capillary 139 (H) 70 - 99 mg/dL  Glucose, capillary     Status: Abnormal   Collection Time: 07/23/18 11:37 AM  Result Value Ref Range   Glucose-Capillary 100 (H) 70 - 99 mg/dL  Glucose, capillary     Status: Abnormal   Collection Time: 07/23/18  4:31 PM  Result Value Ref Range   Glucose-Capillary 145 (H) 70 - 99 mg/dL  Glucose, capillary     Status: Abnormal   Collection Time: 07/23/18  9:31 PM  Result Value Ref Range   Glucose-Capillary 142 (H) 70 - 99 mg/dL  Glucose, capillary     Status: Abnormal   Collection Time: 07/24/18  6:19 AM  Result Value Ref Range   Glucose-Capillary 129 (H) 70 - 99 mg/dL  Glucose, capillary     Status: Abnormal   Collection Time: 07/24/18 11:36 AM  Result Value Ref Range   Glucose-Capillary 121 (H) 70 - 99 mg/dL   Comment 1 Notify RN   Glucose, capillary     Status: Abnormal   Collection Time:  07/24/18  4:34 PM  Result Value Ref Range   Glucose-Capillary 134 (H) 70 - 99 mg/dL   Comment 1 Notify RN   Glucose, capillary     Status: Abnormal   Collection Time: 07/24/18 10:07 PM  Result Value Ref Range   Glucose-Capillary 212 (H) 70 - 99 mg/dL  Glucose, capillary     Status: Abnormal   Collection Time: 07/25/18  6:24 AM  Result Value Ref Range   Glucose-Capillary 121 (H) 70 - 99 mg/dL     Constitutional: No distress . Vital signs reviewed. HENT: Normocephalic.  Atraumatic. Eyes: EOMI. No discharge. Cardiovascular: RRR. No JVD. Respiratory: CTA bilateral. Normal effort. GI: BS +. Non-distended. Musc: No edema or tenderness in extremities. Skin:   Intact. Warm and dry Neuro: Alert Motor: RUE: 3+-4 -/5 proximal to distal RLE: 3+-4 -/5 HF, KE, ADF  Gen NAD. Vital signs reviewed.    Assessment/Plan: 1. Functional deficits secondary to Right hemiparesis Left pontine infarct which require 3+ hours per day of interdisciplinary therapy in a comprehensive inpatient rehab setting. Physiatrist is providing close team supervision and 24 hour management of active medical problems listed below. Physiatrist and rehab team continue to assess barriers to discharge/monitor patient progress toward functional and medical goals. FIM: Function -  Bathing Bathing activity did not occur: Refused Position: Shower Body parts bathed by patient: Chest, Abdomen, Front perineal area, Buttocks, Right upper leg, Left upper leg, Left lower leg, Right lower leg, Right arm, Left arm Body parts bathed by helper: Left arm, Back Assist Level: Supervision or verbal cues  Function- Upper Body Dressing/Undressing What is the patient wearing?: Pull over shirt/dress, Bra Bra - Perfomed by patient: Hook/unhook bra (pull down sports bra), Thread/unthread left bra strap, Thread/unthread right bra strap Bra - Perfomed by helper: Hook/unhook bra (pull down sports bra) Pull over shirt/dress - Perfomed by patient:  Put head through opening, Thread/unthread right sleeve, Pull shirt over trunk, Thread/unthread left sleeve Pull over shirt/dress - Perfomed by helper: Thread/unthread left sleeve, Pull shirt over trunk Assist Level: Supervision or verbal cues Function - Lower Body Dressing/Undressing What is the patient wearing?: Pants, Shoes Position: Wheelchair/chair at sink Pants- Performed by patient: Thread/unthread right pants leg, Thread/unthread left pants leg, Pull pants up/down Pants- Performed by helper: Pull pants up/down Non-skid slipper socks- Performed by patient: Don/doff right sock, Don/doff left sock Non-skid slipper socks- Performed by helper: Don/doff right sock Shoes - Performed by patient: Don/doff right shoe, Don/doff left shoe, Fasten right, Fasten left Shoes - Performed by helper: Fasten left, Fasten right Assist for footwear: Setup Assist for lower body dressing: Supervision or verbal cues  Function - Toileting Toileting steps completed by patient: Adjust clothing prior to toileting, Performs perineal hygiene, Adjust clothing after toileting Toileting steps completed by helper: Adjust clothing prior to toileting, Performs perineal hygiene, Adjust clothing after toileting Assist level: Supervision or verbal cues  Function - Toilet Transfers Toilet transfer assistive device: Cane Assist level to toilet: Supervision or verbal cues Assist level from toilet: Supervision or verbal cues  Function - Chair/bed transfer Chair/bed transfer method: Stand pivot Chair/bed transfer assist level: No Help, no cues, assistive device, takes more than a reasonable amount of time Chair/bed transfer assistive device: Armrests, Cane Chair/bed transfer details: Verbal cues for precautions/safety, Verbal cues for technique  Function - Locomotion: Wheelchair Will patient use wheelchair at discharge?: No Wheelchair activity did not occur: Safety/medical concerns Wheel 50 feet with 2 turns activity  did not occur: Safety/medical concerns Wheel 150 feet activity did not occur: Safety/medical concerns Function - Locomotion: Ambulation Assistive device: Cane-quad Max distance: 150 Assist level: Supervision or verbal cues Assist level: Supervision or verbal cues Assist level: Supervision or verbal cues Walk 150 feet activity did not occur: Safety/medical concerns Assist level: Supervision or verbal cues Walk 10 feet on uneven surfaces activity did not occur: Safety/medical concerns Assist level: Supervision or verbal cues  Function - Comprehension Comprehension: Auditory Comprehension assist level: Follows basic conversation/direction with extra time/assistive device  Function - Expression Expression: Verbal Expression assistive device: Other (Comment)(family and interpreter) Expression assist level: Expresses basic needs/ideas: With extra time/assistive device  Function - Social Interaction Social Interaction assist level: Interacts appropriately 90% of the time - Needs monitoring or encouragement for participation or interaction.  Function - Problem Solving Problem solving assist level: Solves basic 75 - 89% of the time/requires cueing 10 - 24% of the time  Function - Memory Memory assistive device: Other (Comment) Memory assist level: Recognizes or recalls 75 - 89% of the time/requires cueing 10 - 24% of the time Patient normally able to recall (first 3 days only): That he or she is in a hospital  Medical Problem list and Plan: 1. Functional deficits secondary to left pontine infarct  Continue CIR 2. DVT  proph with sq lovenox 40mg  daily 3. HTN: monitor with increased activity           -norvasc, metoprolol, lopressor.            Vitals:   07/25/18 0416 07/25/18 0830  BP: 134/71 126/68  Pulse: 60 71  Resp: 18 18  Temp: 98.9 F (37.2 C)   SpO2: 100% 100%   Overall controlled on 7/31 4. Pain mgt: tylenol prn 5. Mood: team to provide ego support as necessary 6.  Neuropsych: pt is competent to make decisions on her own behalf 7. Diabetes type 2: SSI for covg, TID and HS.  CBG (last 3)  Recent Labs    07/24/18 1634 07/24/18 2207 07/25/18 0624  GLUCAP 134* 212* 121*             -metformin 850mg  bid- was having diarrhea, holding and start low dose amaryl, increased amaryl to 2mg  daily  Labile on 7/31  Will likely require ambulatory adjustments 8. Stroke prophylaxis with ASA and plavix 9.  Leukocytosis without fever: Resolved 10.  Diarrhea improved on lactose-free diet   Monitor for recurrence 11. Acute blood loss anemia  Hemoglobin 11.4 on 7/25  Continue to monitor  LOS (Days) 12 A FACE TO FACE EVALUATION WAS PERFORMED Kyl Givler Karis JubaAnil Jalynn Waddell 07/25/2018, 9:28 AM

## 2018-07-25 NOTE — Discharge Instructions (Signed)
Inpatient Rehab Discharge Instructions  Samantha Paul Discharge date and time:  07/25/18  Activities/Precautions/ Functional Status: Activity: no lifting, driving, or strenuous exercise or strenuous activity Diet: diabetic diet and low fat, low cholesterol diet Wound Care: none needed    Functional status:  ___ No restrictions     ___ Walk up steps independently _X__ 24/7 supervision/assistance   ___ Walk up steps with assistance ___ Intermittent supervision/assistance  ___ Bathe/dress independently ___ Walk with walker     __X_ Bathe/dress with assistance ___ Walk Independently    ___ Shower independently ___ Walk with assistance    ___ Shower with assistance _X__ No alcohol     ___ Return to work/school ________  COMMUNITY REFERRALS UPON DISCHARGE:   Home Health:   PT     OT     Agency:  [facility]:  [phone] Medical Equipment/Items Ordered:  16"x16" wheelchair with cushion; small base quad cane; tub transfer bench  Agency/Supplier:  [facility]       Phone:  [phone]  GENERAL COMMUNITY RESOURCES FOR PATIENT/FAMILY: Support Groups:   [facility] Stroke Support Group                               Meets the second Thursday of each month (each June, July, and August) from 6PM-7PM                               In the dayroom of [facility] at [facility], [address]                               For more information, please call [phone]  Special Instructions: 1. STOP TAKING ASPIRIN AFTER AUGUST 7TH. 2. Monitor blood sugars twice a day before meals and record. Take your meter/readings with you to your doctor.  3.   STROKE/TIA DISCHARGE INSTRUCTIONS SMOKING Cigarette smoking nearly doubles your risk of having a stroke & is the single most alterable risk factor  If you smoke or have smoked in the last 12 months, you are advised to quit smoking for your health.  Most of the excess cardiovascular risk related to smoking  disappears within a year of stopping.  Ask you doctor about anti-smoking medications  [place] Quit Line: 1-800-QUIT NOW  Free Smoking Cessation Classes [phone]  CHOLESTEROL Know your levels; limit fat & cholesterol in your diet  Lipid Panel     Component Value Date/Time   CHOL 209 (H) 07/10/2018 0332   TRIG 148 07/10/2018 0332   HDL 39 (L) 07/10/2018 0332   CHOLHDL 5.4 07/10/2018 0332   VLDL 30 07/10/2018 0332   LDLCALC 140 (H) 07/10/2018 0332      Many patients benefit from treatment even if their cholesterol is at goal.  Goal: Total Cholesterol (CHOL) less than 160  Goal:  Triglycerides (TRIG) less than 150  Goal:  HDL greater than 40  Goal:  LDL (LDLCALC) less than 100   BLOOD PRESSURE American Stroke Association blood pressure target is less that 120/80 mm/Hg  Your discharge blood pressure is:  BP: 113/62  Monitor your blood pressure  Limit your salt and alcohol intake  Many individuals will require more than one medication for high blood pressure  DIABETES (A1c is a blood sugar average for  last 3 months) Goal HGBA1c is under 7% (HBGA1c is blood sugar average for last 3 months)  Diabetes:     Lab Results  Component Value Date   HGBA1C 12.3 (H) 07/10/2018     Your HGBA1c can be lowered with medications, healthy diet, and exercise.  Check your blood sugar as directed by your physician  Call your physician if you experience unexplained or low blood sugars.  PHYSICAL ACTIVITY/REHABILITATION Goal is 30 minutes at least 4 days per week  Activity: No driving, Therapies: see above Return to work: n/a  Activity decreases your risk of heart attack and stroke and makes your heart stronger.  It helps control your weight and blood pressure; helps you relax and can improve your mood.  Participate in a regular exercise program.  Talk with your doctor about the best form of exercise for you (dancing, walking, swimming, cycling).  DIET/WEIGHT Goal is to maintain a  healthy weight  Your discharge diet is:  Diet Order           Diet Carb Modified Fluid consistency: Thin; Room service appropriate? Yes  Diet effective now         liquids Your height is:  Height: 4\' 10"  (147.3 cm) Your current weight is: Weight: 56.2 kg (123 lb 14.9 oz) Your Body Mass Index (BMI) is:  BMI (Calculated): 25.91  Following the type of diet specifically designed for you will help prevent another stroke.  Your goal weight range is:    Your goal Body Mass Index (BMI) is 19-24.  Healthy food habits can help reduce 3 risk factors for stroke:  High cholesterol, hypertension, and excess weight.  RESOURCES Stroke/Support Group:  Call [phone]   STROKE EDUCATION PROVIDED/REVIEWED AND GIVEN TO PATIENT Stroke warning signs and symptoms How to activate emergency medical system (call 911). Medications prescribed at discharge. Need for follow-up after discharge. Personal risk factors for stroke. Pneumonia vaccine given:  Flu vaccine given:  My questions have been answered, the writing is legible, and I understand these instructions.  I will adhere to these goals & educational materials that have been provided to me after my discharge from the hospital.     My questions have been answered and I understand these instructions. I will adhere to these goals and the provided educational materials after my discharge from the hospital.  Patient/Caregiver Signature _______________________________ Date __________  Clinician Signature _______________________________________ Date __________  Please bring this form and your medication list with you to all your follow-up doctor's appointments.

## 2018-07-25 NOTE — Progress Notes (Signed)
Patient appears to be resting comfortably throughout shift, respirations even and unlabored , Husbans at bedside, Antciipates discharge home today

## 2018-07-25 NOTE — Plan of Care (Signed)
  Problem: RH SAFETY Goal: RH STG ADHERE TO SAFETY PRECAUTIONS W/ASSISTANCE/DEVICE Description STG Adhere to Safety Precautions With Mod I Assistance/Device.  Outcome: Progressing  Call light within reach, bed alarm, proper footwear

## 2018-07-26 ENCOUNTER — Telehealth: Payer: Self-pay | Admitting: Registered Nurse

## 2018-07-26 NOTE — Progress Notes (Signed)
Social Work Discharge Note  The overall goal for the admission was met for:   Discharge location: Yes - home with family  Length of Stay: Yes - 12 days  Discharge activity level: Yes - supervision  Home/community participation: Yes  Services provided included: MD, RD, PT, OT, RN, Pharmacy and SW  Financial Services: Private Insurance: [employer]  Follow-up services arranged: Home Health: RN/PT/OT from [facility], DME: 16"x16" lightweight wheelchair with basic cushion; small base quad cane; tub transfer bench from [place] and Patient/Family has no preference for HH/DME agencies  Comments (or additional information): Husband has been at bedside daily and received family education.  Pt went to son's home:  [facility], [place], [place], [place] [address] [phone]  Patient/Family verbalized understanding of follow-up arrangements: Yes  Individual responsible for coordination of the follow-up plan: pt and her husband and son  Confirmed correct DME delivered: Trey Sailors 07/26/2018    Dominique Calvey, Silvestre Mesi

## 2018-07-26 NOTE — Telephone Encounter (Signed)
Transitional Care call  Transitional Care Call Questions Answered by Son: Monico HoarSiu, Hoang  Patient name: Samantha Paul, Reek DOB: 01-23-1946 1. Are you/is patient experiencing any problems since coming home? No a. Are there any questions regarding any aspect of care? No 2. Are there any questions regarding medications administration/dosing? No a. Are meds being taken as prescribed? Yes b. "Patient should review meds with caller to confirm"  Medication List Reviewed with Physical Therapist who was at the home during the call. The family has to pick up the Tylenol and ASA today, she reports. 3. Have there been any falls? No 4. Has Home Health been to the house and/or have they contacted you? Yes, [facility], spoke with therapist. Given orders two times a week for 8 weeks. The therapist states the family needs a RN in the home to instruct on the glucometer and medication. According to Cornelious BryantJennifer LCSW nurse was scheduled, this provider called the SW, and left message regarding the above, awaiting a return call.  a. If not, have you tried to contact them? NA b. Can we help you contact them? No 5. Are bowels and bladder emptying properly? Yes a. Are there any unexpected incontinence issues? No b. If applicable, is patient following bowel/bladder programs? NA 6. Any fevers, problems with breathing, unexpected pain? No 7. Are there any skin problems or new areas of breakdown? No 8. Has the patient/family member arranged specialty MD follow up (ie cardiology/neurology/renal/surgical/etc.)?  The therapist will schedule the appointment with [facility] today.  a. Can we help arrange? See Above 9. Does the patient need any other services or support that we can help arrange? NA 10. Are caregivers following through as expected in assisting the patient? Yes 11. Has the patient quit smoking, drinking alcohol, or using drugs as recommended? Mr. Oda CoganHoang reports his mother Ms. Wortley doesn't smoke, drink alcohol or use  illicit drugs.   Appointment date/time 08/01/2018, arrival time 08:40 for 9:00 appointment with Jacalyn LefevreEunice Harry Shuck ANP. At [address]

## 2018-08-01 ENCOUNTER — Encounter: Payer: Self-pay | Admitting: Registered Nurse

## 2018-08-01 ENCOUNTER — Encounter: Payer: Medicare Other | Attending: Registered Nurse | Admitting: Registered Nurse

## 2018-08-01 VITALS — BP 166/80 | HR 71 | Resp 14 | Ht <= 58 in | Wt 130.0 lb

## 2018-08-01 DIAGNOSIS — I10 Essential (primary) hypertension: Secondary | ICD-10-CM | POA: Diagnosis not present

## 2018-08-01 DIAGNOSIS — E7849 Other hyperlipidemia: Secondary | ICD-10-CM

## 2018-08-01 DIAGNOSIS — I639 Cerebral infarction, unspecified: Secondary | ICD-10-CM

## 2018-08-01 DIAGNOSIS — I69351 Hemiplegia and hemiparesis following cerebral infarction affecting right dominant side: Secondary | ICD-10-CM | POA: Insufficient documentation

## 2018-08-01 DIAGNOSIS — E119 Type 2 diabetes mellitus without complications: Secondary | ICD-10-CM | POA: Diagnosis not present

## 2018-08-01 DIAGNOSIS — I6350 Cerebral infarction due to unspecified occlusion or stenosis of unspecified cerebral artery: Secondary | ICD-10-CM | POA: Diagnosis not present

## 2018-08-01 DIAGNOSIS — R531 Weakness: Secondary | ICD-10-CM | POA: Diagnosis not present

## 2018-08-01 DIAGNOSIS — E785 Hyperlipidemia, unspecified: Secondary | ICD-10-CM | POA: Insufficient documentation

## 2018-08-01 NOTE — Progress Notes (Signed)
Subjective:    Patient ID: Samantha Paul, female    DOB: [birth year], 72 y.o.   MRN: [id number]  HPI: [name] is a 72 year old female who is here for a transitional care visit in follow up of her left  Pontine stroke, hemiparesis affecting right side as late effect of stroke, HTN, DM type 2 and hyperlipidemia. She has been home with home health therapies from [facility]. She denies any pain. Also reports good appetite.   Translator and husband in room, all questions answered via Nurse, learning disability.    Pain Inventory Average Pain 3 Pain Right Now 3 My pain is dull  In the last 24 hours, has pain interfered with the following? General activity 2 Relation with others 0 Enjoyment of life 0 What TIME of day is your pain at its worst? daytime Sleep (in general) Good  Pain is worse with: bending Pain improves with: medication Relief from Meds: 0  Mobility walk with assistance use a cane how many minutes can you walk? 6-7 ability to climb steps?  no do you drive?  no needs help with transfers Do you have any goals in this area?  yes  Function retired Do you have any goals in this area?  no  Neuro/Psych weakness trouble walking  Prior Studies transitional care  Physicians involved in your care transitional care   Family History  Problem Relation Age of Onset  . Arthritis Brother    Social History   Socioeconomic History  . Marital status: Married    Spouse name: Not on file  . Number of children: Not on file  . Years of education: Not on file  . Highest education level: Not on file  Occupational History  . Not on file  Social Needs  . Financial resource strain: Not on file  . Food insecurity:    Worry: Not on file    Inability: Not on file  . Transportation needs:    Medical: Not on file    Non-medical: Not on file  Tobacco Use  . Smoking status: Never Smoker  . Smokeless tobacco: Never Used  Substance and Sexual Activity  . Alcohol use: Not on file  .  Drug use: Not on file  . Sexual activity: Not on file  Lifestyle  . Physical activity:    Days per week: Not on file    Minutes per session: Not on file  . Stress: Not on file  Relationships  . Social connections:    Talks on phone: Not on file    Gets together: Not on file    Attends religious service: Not on file    Active member of club or organization: Not on file    Attends meetings of clubs or organizations: Not on file    Relationship status: Not on file  Other Topics Concern  . Not on file  Social History Narrative  . Not on file   History reviewed. No pertinent surgical history. Past Medical History:  Diagnosis Date  . Back pain   . HTN (hypertension)   . Knee pain    BP (!) 166/80 (BP Location: Left Arm, Patient Position: Sitting, Cuff Size: Normal)   Pulse 71   Resp 14   Ht 4\' 10"  (1.473 m)   Wt 130 lb (59 kg)   SpO2 97%   BMI 27.17 kg/m   Opioid Risk Score:   Fall Risk Score:  `1  Depression screen PHQ 2/9  No flowsheet data  found.  Review of Systems  Constitutional: Negative.   HENT: Negative.   Eyes: Negative.   Respiratory: Negative.   Cardiovascular: Negative.   Gastrointestinal: Negative.   Endocrine: Negative.   Genitourinary: Negative.   Musculoskeletal: Positive for gait problem.  Skin: Negative.   Allergic/Immunologic: Negative.   Neurological: Positive for weakness.       Objective:   Physical Exam  Constitutional: She is oriented to person, place, and time. She appears well-developed and well-nourished.  HENT:  Head: Normocephalic and atraumatic.  Neck: Normal range of motion. Neck supple.  Cardiovascular: Normal rate and regular rhythm.  Pulmonary/Chest: Effort normal and breath sounds normal.  Musculoskeletal:  Normal Muscle Bulk and Muscle Testing Reveals: Upper Extremities: Right: Decreased ROM 90 Degrees and Muscle Strength 4/5 Left: Full ROM and Muscle Strength 5/5 Lower Extremities: Full ROM and Muscle Strength  5/5 Arises from Table with ease using 4 prong cane Gait Steady with Normal Steps.  Neurological: She is alert and oriented to person, place, and time.  Skin: Skin is warm and dry.  Psychiatric: She has a normal mood and affect. Her behavior is normal.  Nursing note and vitals reviewed.         Assessment & Plan:  1. Left [place] Stoke/ Right Hemiparesis: Continue Home Health Therapies. Has a scheduled appointment with Neurology.  2. HTN: Continue current medication regime: PCP Following: BMP will be drawn at PCP Office.  3. Type 2 DM: Continue current medication regimen: PCP Following. Hyperlipidemia: Continue current medication regimen. PCP Following.   40 minutes of face to face patient care time was spent during this visit. All questions was encouraged and answered.   F/U in 4- 6 weeks with Dr. Wynn BankerKirsteins.

## 2018-08-16 ENCOUNTER — Institutional Professional Consult (permissible substitution): Payer: Medicare Other | Admitting: Adult Health

## 2018-08-28 NOTE — Progress Notes (Signed)
[facility] [address] [place]. [place] [address] [phone]       OFFICE FOLLOW UP NOTE  [name] Date of Birth:  [birth year] Medical Record Number:  [id number]   Reason for Referral:  hospital stroke follow up  CHIEF COMPLAINT:  Chief Complaint  Patient presents with  . Follow-up    Stroke hospital follow up room 9 patient with  husband ,and interpreter     HPI: Samantha Paul is being seen today for initial visit in the office for left pontine infarct on 07/09/2018. History obtained from patient, husband, interpreter and chart review. Reviewed all radiology images and labs personally.  [name] is a 72 y.o. female with no significant hx who was admitted for right sided weakness and right facial droop. No tPA given due to OSW.    MRI head reviewed and showed left pontine infarct.  CTA of head and neck were unremarkable.  2D echo showed an EF of 65 to 70%.  LDL 140 and recommended Lipitor 40 mg.  Blood pressure elevated during admission started on amlodipine and losartan and recommended long-term BP goal normotensive range.  A1c 12.3 and patient diagnosed with DM and recommended outpatient follow-up with PCP for management.  Patient was not on antithrombotic prior to admission and recommended DAPT for 3 weeks and either aspirin or Plavix alone.  Patient was discharged to CIR due to continued right hemiparesis.  Patient is being seen today for hospital follow-up and is accompanied by her husband and interpreter.  She continues to have right hemiparesis.  She complains of pain in her right shoulder and knee.  She does state that she had occasional knee pain prior to the stroke but has increased since.  She was started on Lipitor 40 mg at hospital discharge.  She does continue home PT for continued deficits but has been making improvement.  She has completed 3-week DAPT and continuing on Plavix only without side effects of bleeding or bruising.  Blood pressure 147/80 but  husband does monitor this at home and typically SBP 1 30-1 40.  Glucose levels monitored at home and currently ranging from 100-200 and this is managed by PCP.  Scheduled follow-up appointment PCP scheduled for this Friday.  Denies new or worsening stroke/TIA symptoms.   ROS:   14 system review of systems performed and negative with exception of weight loss, swelling in legs, joint pain, joint swelling, aching muscles and weakness  PMH:  Past Medical History:  Diagnosis Date  . Back pain   . HTN (hypertension)   . Knee pain   . Stroke [facility])     PSH: History reviewed. No pertinent surgical history.  Social History:  Social History   Socioeconomic History  . Marital status: Married    Spouse name: Not on file  . Number of children: Not on file  . Years of education: Not on file  . Highest education level: Not on file  Occupational History  . Not on file  Social Needs  . Financial resource strain: Not on file  . Food insecurity:    Worry: Not on file    Inability: Not on file  . Transportation needs:    Medical: Not on file    Non-medical: Not on file  Tobacco Use  . Smoking status: Never Smoker  . Smokeless tobacco: Never Used  Substance and Sexual Activity  . Alcohol use: Not Currently  . Drug use: Not Currently  . Sexual activity: Not on  file  Lifestyle  . Physical activity:    Days per week: Not on file    Minutes per session: Not on file  . Stress: Not on file  Relationships  . Social connections:    Talks on phone: Not on file    Gets together: Not on file    Attends religious service: Not on file    Active member of club or organization: Not on file    Attends meetings of clubs or organizations: Not on file    Relationship status: Not on file  . Intimate partner violence:    Fear of current or ex partner: Not on file    Emotionally abused: Not on file    Physically abused: Not on file    Forced sexual activity: Not on file  Other Topics Concern  . Not  on file  Social History Narrative  . Not on file    Family History:  Family History  Problem Relation Age of Onset  . Arthritis Brother     Medications:   Current Outpatient Medications on File Prior to Visit  Medication Sig Dispense Refill  . acetaminophen (TYLENOL) 325 MG tablet Take 1-2 tablets (325-650 mg total) by mouth every 4 (four) hours as needed for mild pain.    Marland Kitchen amLODipine (NORVASC) 10 MG tablet Take 1 tablet (10 mg total) by mouth daily. 30 tablet 0  . blood glucose meter kit and supplies KIT Dispense based on patient and insurance preference. Use up to four times daily as directed. (FOR ICD-10--E11.9). 1 each 0  . clopidogrel (PLAVIX) 75 MG tablet Take 1 tablet (75 mg total) by mouth daily. 30 tablet 0  . glimepiride (AMARYL) 1 MG tablet Take 1 tablet (1 mg total) by mouth daily with breakfast. 30 tablet 0  . losartan (COZAAR) 50 MG tablet Take 1 tablet (50 mg total) by mouth daily. 30 tablet 0  . metoprolol tartrate (LOPRESSOR) 25 MG tablet Take 1 tablet (25 mg total) by mouth 2 (two) times daily. 60 tablet 0  . senna-docusate (SENOKOT-S) 8.6-50 MG tablet Take 2 tablets by mouth at bedtime. 60 tablet 0   No current facility-administered medications on file prior to visit.     Allergies:   Allergies  Allergen Reactions  . Metformin And Related     Abdominal pain and diarrhea     Physical Exam  Vitals:   08/29/18 1116  BP: (!) 147/78  Pulse: 80  Weight: 128 lb (58.1 kg)  Height: '4\' 10"'$  (1.473 m)   Body mass index is 26.75 kg/m. No exam data present  General: well developed, well nourished, pleasant elderly female, seated, in no evident distress Head: head normocephalic and atraumatic.   Neck: supple with no carotid or supraclavicular bruits Cardiovascular: regular rate and rhythm, no murmurs Musculoskeletal: no deformity Skin:  no rash/petichiae Vascular:  Normal pulses all extremities  Neurologic Exam Mental Status: Awake and fully alert.  Oriented to place and time. Recent and remote memory intact. Attention span, concentration and fund of knowledge appropriate. Mood and affect appropriate.  Cranial Nerves: Fundoscopic exam reveals sharp disc margins. Pupils equal, briskly reactive to light. Extraocular movements full without nystagmus. Visual fields full to confrontation. Hearing intact. Facial sensation intact. Face, tongue, palate moves normally and symmetrically.  Motor: Normal bulk and tone.  RUE and RLE: 4/5; full strength left side Sensory.: intact to touch , pinprick , position and vibratory sensation.  Coordination: Rapid alternating movements normal in all extremities. Finger-to-nose and  heel-to-shin performed accurately bilaterally.  Orbits left arm over right arm.  Decreased finger dexterity right hand. Gait and Station: Arises from chair without difficulty. Stance is normal. Gait demonstrates hemiplegic gait with assistance of cane.  Unable to perform tandem gait. Reflexes: 1+ and symmetric. Toes downgoing.    NIHSS  0 Modified Rankin  2   Diagnostic Data (Labs, Imaging, Testing)  CT HEAD WO CONTRAST 07/09/2018 IMPRESSION: Age-indeterminate infarct of the left pons believed to account for ill-defined hypodensity noted within. Atrophy with age-indeterminate small vessel ischemic disease is also noted though more likely to be chronic given atherosclerosis at the skull base. MRI may prove useful for further correlation.  MR BRAIN WO CONTRAST 07/10/2018 IMPRESSION: 1 x 2 cm acute infarction in the left para median pons. No evidence of hemorrhage or mass effect. Mild chronic small-vessel change elsewhere as above.  CT ANGIO HEAD W OR WO CONTRAST CT ANGIO NECK W OR WO CONTRAST 07/10/2018 IMPRESSION: Carotid bifurcations appear normal. No anterior circulation disease of significance. Tortuous vessels suggesting a history of hypertension. Mild atherosclerotic irregularity of the basilar artery but without flow  limiting stenosis.  ECHOCARDIOGRAM 07/10/2018 Study Conclusions - Left ventricle: The cavity size was normal. Wall thickness was   increased in a pattern of mild LVH. There was moderate focal   basal hypertrophy of the septum. Systolic function was vigorous.   The estimated ejection fraction was in the range of 65% to 70%.   Wall motion was normal; there were no regional wall motion   abnormalities. Doppler parameters are consistent with abnormal   left ventricular relaxation (grade 1 diastolic dysfunction).   Doppler parameters are consistent with high ventricular filling   pressure. - Mitral valve: Calcified annulus. There was systolic anterior   motion of the chordal structures.   ASSESSMENT: Samantha Paul is a 72 y.o. year old female here with left pontine infarct on 07/09/2018 secondary to small vessel disease. Vascular risk factors include HTN, HLD and new onset DM.  Patient is being seen today for hospital follow-up and continues to have right hemiparesis but this has been improving.    PLAN: -Continue clopidogrel 75 mg daily  for secondary stroke prevention -Difficult to determine if shoulder and knee pain are related to statin use will recommend stop Lipitor 40 mg and start Crestor 20 mg and advised patient and husband that if pain subsides, to have PCP repeat lipid panel in 6 to 8 weeks to ensure adequate dosage for HLD management.  If pain does not subside, recommend follow-up with PCP for additional work-up of shoulder and knee pain -F/u with PCP regarding your HLD, HTN and DM management -continue to monitor BP and glucose levels at home -Continue home PT and advised of orders are needed for outpatient therapy, to notify us and orders will be placed -advised to continue to stay active and maintain a healthy diet -Maintain strict control of hypertension with blood pressure goal below 130/90, diabetes with hemoglobin A1c goal below 6.5% and cholesterol with LDL cholesterol (bad  cholesterol) goal below 70 mg/dL. I also advised the patient to eat a healthy diet with plenty of whole grains, cereals, fruits and vegetables, exercise regularly and maintain ideal body weight.  Follow up in 3 months or call earlier if needed   Greater than 50% of time during this 25 minute visit was spent on counseling,explanation of diagnosis of left pontine infarct, reviewing risk factor management of HLD, HTN and DM, planning of further management, discussion with patient  and family and coordination of care    Venancio Poisson, [facility] [address]  [facility] [address] [place], [place] [address]  Phone [phone] Fax [phone] Note: This document was prepared with digital dictation and possible smart phrase technology. Any transcriptional errors that result from this process are unintentional.

## 2018-08-29 ENCOUNTER — Encounter: Payer: Self-pay | Admitting: Adult Health

## 2018-08-29 ENCOUNTER — Ambulatory Visit: Payer: Medicare Other | Admitting: Adult Health

## 2018-08-29 VITALS — BP 147/78 | HR 80 | Ht <= 58 in | Wt 128.0 lb

## 2018-08-29 DIAGNOSIS — I69351 Hemiplegia and hemiparesis following cerebral infarction affecting right dominant side: Secondary | ICD-10-CM

## 2018-08-29 DIAGNOSIS — I10 Essential (primary) hypertension: Secondary | ICD-10-CM

## 2018-08-29 DIAGNOSIS — I6350 Cerebral infarction due to unspecified occlusion or stenosis of unspecified cerebral artery: Secondary | ICD-10-CM

## 2018-08-29 DIAGNOSIS — E119 Type 2 diabetes mellitus without complications: Secondary | ICD-10-CM

## 2018-08-29 DIAGNOSIS — I639 Cerebral infarction, unspecified: Secondary | ICD-10-CM

## 2018-08-29 DIAGNOSIS — E7849 Other hyperlipidemia: Secondary | ICD-10-CM

## 2018-08-29 MED ORDER — ROSUVASTATIN CALCIUM 20 MG PO TABS: 20.0000 mg | ORAL_TABLET | Freq: Every day | ORAL | 4 refills | Status: DC

## 2018-08-29 NOTE — Patient Instructions (Addendum)
Continue clopidogrel 75 mg daily  and start Crestor 20mg   for secondary stroke prevention  Due to muscle pains that could be potentially caused by cholesterol medication, we will stop lipitor and start crestor. Please follow up with primary care doctor and have them check cholesterol levels in 6-8 weeks to ensure adequate dosage of cholesterol medication. If pains continue 1 week out from stopping medication, please follow up with primary care for further evaluation of shoulder and knee pain as it could be caused by an underlying condition  Continue to follow up with PCP regarding cholesterol, blood pressure and diabetes management   Continue home therapies for continued right weakness  Continue to monitor blood pressure at home  Maintain strict control of hypertension with blood pressure goal below 130/90, diabetes with hemoglobin A1c goal below 6.5% and cholesterol with LDL cholesterol (bad cholesterol) goal below 70 mg/dL. I also advised the patient to eat a healthy diet with plenty of whole grains, cereals, fruits and vegetables, exercise regularly and maintain ideal body weight.  Followup in the future with me in 3 months or call earlier if needed      Thank you for coming to see [place] at [facility]. I hope we have been able to provide you high quality care today.  You may receive a patient satisfaction survey over the next few weeks. We would appreciate your feedback and comments so that we may continue to improve ourselves and the health of our patients.     Stroke Prevention Some health problems and behaviors may make it more likely for you to have a stroke. Below are ways to lessen your risk of having a stroke.  Be active for at least 30 minutes on most or all days.  Do not smoke. Try not to be around others who smoke.  Do not drink too much alcohol. ? Do not have more than 2 drinks a day if you are a man. ? Do not have more than 1 drink a day if you are a  woman and are not pregnant.  Eat healthy foods, such as fruits and vegetables. If you were put on a specific diet, follow the diet as told.  Keep your cholesterol levels under control through diet and medicines. Look for foods that are low in saturated fat, trans fat, cholesterol, and are high in fiber.  If you have diabetes, follow all diet plans and take your medicine as told.  Ask your doctor if you need treatment to lower your blood pressure. If you have high blood pressure (hypertension), follow all diet plans and take your medicine as told by your doctor.  If you are 23-61 years old, have your blood pressure checked every 3-5 years. If you are age 50 or older, have your blood pressure checked every year.  Keep a healthy weight. Eat foods that are low in calories, salt, saturated fat, trans fat, and cholesterol.  Do not take drugs.  Avoid birth control pills, if this applies. Talk to your doctor about the risks of taking birth control pills.  Talk to your doctor if you have sleep problems (sleep apnea).  Take all medicine as told by your doctor. ? You may be told to take aspirin or blood thinner medicine. Take this medicine as told by your doctor. ? Understand your medicine instructions.  Make sure any other conditions you have are being taken care of.  Get help right away if:  You suddenly lose feeling (you feel numb) or  have weakness in your face, arm, or leg.  Your face or eyelid hangs down to one side.  You suddenly feel confused.  You have trouble talking (aphasia) or understanding what people are saying.  You suddenly have trouble seeing in one or both eyes.  You suddenly have trouble walking.  You are dizzy.  You lose your balance or your movements are clumsy (uncoordinated).  You suddenly have a very bad headache and you do not know the cause.  You have new chest pain.  Your heart feels like it is fluttering or skipping a beat (irregular heartbeat). Do  not wait to see if the symptoms above go away. Get help right away. Call your local emergency services (911 in [place] Do not drive yourself to the hospital. This information is not intended to replace advice given to you by your health care provider. Make sure you discuss any questions you have with your health care provider. Document Released: 06/12/2012 Document Revised: 05/19/2016 Document Reviewed: 06/14/2013 Elsevier Interactive Patient Education  [employer].

## 2018-08-31 NOTE — Progress Notes (Signed)
I agree with the above plan 

## 2018-09-03 ENCOUNTER — Ambulatory Visit: Payer: Medicare Other | Admitting: Physical Medicine & Rehabilitation

## 2018-09-03 ENCOUNTER — Encounter: Payer: Medicare Other | Attending: Registered Nurse

## 2018-09-03 DIAGNOSIS — R531 Weakness: Secondary | ICD-10-CM | POA: Insufficient documentation

## 2018-09-03 DIAGNOSIS — I10 Essential (primary) hypertension: Secondary | ICD-10-CM | POA: Insufficient documentation

## 2018-09-03 DIAGNOSIS — I69351 Hemiplegia and hemiparesis following cerebral infarction affecting right dominant side: Secondary | ICD-10-CM | POA: Insufficient documentation

## 2018-09-03 DIAGNOSIS — E119 Type 2 diabetes mellitus without complications: Secondary | ICD-10-CM | POA: Insufficient documentation

## 2018-09-03 DIAGNOSIS — E785 Hyperlipidemia, unspecified: Secondary | ICD-10-CM | POA: Insufficient documentation

## 2018-12-07 ENCOUNTER — Ambulatory Visit: Payer: Medicare Other | Admitting: Adult Health

## 2018-12-07 NOTE — Progress Notes (Deleted)
[facility] [address] [place]. [place] [address] [phone]       OFFICE FOLLOW UP NOTE  [name] Date of Birth:  09-Apr-1946 Medical Record Number:  [id number]   Reason for Referral:  hospital stroke follow up  CHIEF COMPLAINT:  No chief complaint on file.   HPI: Samantha Paul is being seen today in the office for left pontine infarct on 07/09/2018. History obtained from patient, husband, interpreter and chart review. Reviewed all radiology images and labs personally.  [name] is a 72 y.o. female with no significant hx who was admitted for right sided weakness and right facial droop. No tPA given due to OSW.    MRI head reviewed and showed left pontine infarct.  CTA of head and neck were unremarkable.  2D echo showed an EF of 65 to 70%.  LDL 140 and recommended Lipitor 40 mg.  Blood pressure elevated during admission started on amlodipine and losartan and recommended long-term BP goal normotensive range.  A1c 12.3 and patient diagnosed with DM and recommended outpatient follow-up with PCP for management.  Patient was not on antithrombotic prior to admission and recommended DAPT for 3 weeks and either aspirin or Plavix alone.  Patient was discharged to CIR due to continued right hemiparesis.  08/29/2018 visit: Patient is being seen today for hospital follow-up and is accompanied by her husband and interpreter.  She continues to have right hemiparesis.  She complains of pain in her right shoulder and knee.  She does state that she had occasional knee pain prior to the stroke but has increased since.  She was started on Lipitor 40 mg at hospital discharge.  She does continue home PT for continued deficits but has been making improvement.  She has completed 3-week DAPT and continuing on Plavix only without side effects of bleeding or bruising.  Blood pressure 147/80 but husband does monitor this at home and typically SBP 1 30-1 40.  Glucose levels monitored at home and  currently ranging from 100-200 and this is managed by PCP.  Scheduled follow-up appointment PCP scheduled for this Friday.  Denies new or worsening stroke/TIA symptoms.  Interval history 12/07/2018: Patient is being seen today for 75-monthfollow-up visit.    ROS:   14 system review of systems performed and negative with exception of weight loss, swelling in legs, joint pain, joint swelling, aching muscles and weakness  PMH:  Past Medical History:  Diagnosis Date  . Back pain   . HTN (hypertension)   . Knee pain   . Stroke ([facility]     PSH: No past surgical history on file.  Social History:  Social History   Socioeconomic History  . Marital status: Married    Spouse name: Not on file  . Number of children: Not on file  . Years of education: Not on file  . Highest education level: Not on file  Occupational History  . Not on file  Social Needs  . Financial resource strain: Not on file  . Food insecurity:    Worry: Not on file    Inability: Not on file  . Transportation needs:    Medical: Not on file    Non-medical: Not on file  Tobacco Use  . Smoking status: Never Smoker  . Smokeless tobacco: Never Used  Substance and Sexual Activity  . Alcohol use: Not Currently  . Drug use: Not Currently  . Sexual activity: Not on file  Lifestyle  . Physical activity:  Days per week: Not on file    Minutes per session: Not on file  . Stress: Not on file  Relationships  . Social connections:    Talks on phone: Not on file    Gets together: Not on file    Attends religious service: Not on file    Active member of club or organization: Not on file    Attends meetings of clubs or organizations: Not on file    Relationship status: Not on file  . Intimate partner violence:    Fear of current or ex partner: Not on file    Emotionally abused: Not on file    Physically abused: Not on file    Forced sexual activity: Not on file  Other Topics Concern  . Not on file  Social History  Narrative  . Not on file    Family History:  Family History  Problem Relation Age of Onset  . Arthritis Brother     Medications:   Current Outpatient Medications on File Prior to Visit  Medication Sig Dispense Refill  . acetaminophen (TYLENOL) 325 MG tablet Take 1-2 tablets (325-650 mg total) by mouth every 4 (four) hours as needed for mild pain.    Marland Kitchen amLODipine (NORVASC) 10 MG tablet Take 1 tablet (10 mg total) by mouth daily. 30 tablet 0  . blood glucose meter kit and supplies KIT Dispense based on patient and insurance preference. Use up to four times daily as directed. (FOR ICD-10--E11.9). 1 each 0  . clopidogrel (PLAVIX) 75 MG tablet Take 1 tablet (75 mg total) by mouth daily. 30 tablet 0  . glimepiride (AMARYL) 1 MG tablet Take 1 tablet (1 mg total) by mouth daily with breakfast. 30 tablet 0  . losartan (COZAAR) 50 MG tablet Take 1 tablet (50 mg total) by mouth daily. 30 tablet 0  . metoprolol tartrate (LOPRESSOR) 25 MG tablet Take 1 tablet (25 mg total) by mouth 2 (two) times daily. 60 tablet 0  . rosuvastatin (CRESTOR) 20 MG tablet Take 1 tablet (20 mg total) by mouth daily. 30 tablet 4  . senna-docusate (SENOKOT-S) 8.6-50 MG tablet Take 2 tablets by mouth at bedtime. 60 tablet 0   No current facility-administered medications on file prior to visit.     Allergies:   Allergies  Allergen Reactions  . Metformin And Related     Abdominal pain and diarrhea     Physical Exam  There were no vitals filed for this visit. There is no height or weight on file to calculate BMI. No exam data present  General: well developed, well nourished, pleasant elderly female, seated, in no evident distress Head: head normocephalic and atraumatic.   Neck: supple with no carotid or supraclavicular bruits Cardiovascular: regular rate and rhythm, no murmurs Musculoskeletal: no deformity Skin:  no rash/petichiae Vascular:  Normal pulses all extremities  Neurologic Exam Mental Status:  Awake and fully alert. Oriented to place and time. Recent and remote memory intact. Attention span, concentration and fund of knowledge appropriate. Mood and affect appropriate.  Cranial Nerves: Fundoscopic exam reveals sharp disc margins. Pupils equal, briskly reactive to light. Extraocular movements full without nystagmus. Visual fields full to confrontation. Hearing intact. Facial sensation intact. Face, tongue, palate moves normally and symmetrically.  Motor: Normal bulk and tone.  RUE and RLE: 4/5; full strength left side Sensory.: intact to touch , pinprick , position and vibratory sensation.  Coordination: Rapid alternating movements normal in all extremities. Finger-to-nose and heel-to-shin performed accurately bilaterally.  Orbits left arm over right arm.  Decreased finger dexterity right hand. Gait and Station: Arises from chair without difficulty. Stance is normal. Gait demonstrates hemiplegic gait with assistance of cane.  Unable to perform tandem gait. Reflexes: 1+ and symmetric. Toes downgoing.    NIHSS  0 Modified Rankin  2   Diagnostic Data (Labs, Imaging, Testing)  CT HEAD WO CONTRAST 07/09/2018 IMPRESSION: Age-indeterminate infarct of the left pons believed to account for ill-defined hypodensity noted within. Atrophy with age-indeterminate small vessel ischemic disease is also noted though more likely to be chronic given atherosclerosis at the skull base. MRI may prove useful for further correlation.  MR BRAIN WO CONTRAST 07/10/2018 IMPRESSION: 1 x 2 cm acute infarction in the left para median pons. No evidence of hemorrhage or mass effect. Mild chronic small-vessel change elsewhere as above.  CT ANGIO HEAD W OR WO CONTRAST CT ANGIO NECK W OR WO CONTRAST 07/10/2018 IMPRESSION: Carotid bifurcations appear normal. No anterior circulation disease of significance. Tortuous vessels suggesting a history of hypertension. Mild atherosclerotic irregularity of the basilar  artery but without flow limiting stenosis.  ECHOCARDIOGRAM 07/10/2018 Study Conclusions - Left ventricle: The cavity size was normal. Wall thickness was   increased in a pattern of mild LVH. There was moderate focal   basal hypertrophy of the septum. Systolic function was vigorous.   The estimated ejection fraction was in the range of 65% to 70%.   Wall motion was normal; there were no regional wall motion   abnormalities. Doppler parameters are consistent with abnormal   left ventricular relaxation (grade 1 diastolic dysfunction).   Doppler parameters are consistent with high ventricular filling   pressure. - Mitral valve: Calcified annulus. There was systolic anterior   motion of the chordal structures.   ASSESSMENT: Drew Nightengale is a 72 y.o. year old female here with left pontine infarct on 07/09/2018 secondary to small vessel disease. Vascular risk factors include HTN, HLD and new onset DM.  Patient is being seen today for hospital follow-up and continues to have right hemiparesis but this has been improving.    PLAN: -Continue clopidogrel 75 mg daily  for secondary stroke prevention -Difficult to determine if shoulder and knee pain are related to statin use will recommend stop Lipitor 40 mg and start Crestor 20 mg and advised patient and husband that if pain subsides, to have PCP repeat lipid panel in 6 to 8 weeks to ensure adequate dosage for HLD management.  If pain does not subside, recommend follow-up with PCP for additional work-up of shoulder and knee pain -F/u with PCP regarding your HLD, HTN and DM management -continue to monitor BP and glucose levels at home -Continue home PT and advised of orders are needed for outpatient therapy, to notify us and orders will be placed -advised to continue to stay active and maintain a healthy diet -Maintain strict control of hypertension with blood pressure goal below 130/90, diabetes with hemoglobin A1c goal below 6.5% and cholesterol with  LDL cholesterol (bad cholesterol) goal below 70 mg/dL. I also advised the patient to eat a healthy diet with plenty of whole grains, cereals, fruits and vegetables, exercise regularly and maintain ideal body weight.  Follow up in 3 months or call earlier if needed   Greater than 50% of time during this 25 minute visit was spent on counseling,explanation of diagnosis of left pontine infarct, reviewing risk factor management of HLD, HTN and DM, planning of further management, discussion with patient and family and coordination of  care    Venancio Poisson, AGNP-BC  [facility] [address] [place], [place] [address]  Phone [phone] Fax [phone] Note: This document was prepared with digital dictation and possible smart phrase technology. Any transcriptional errors that result from this process are unintentional.

## 2018-12-11 ENCOUNTER — Encounter: Payer: Self-pay | Admitting: Adult Health

## 2018-12-20 ENCOUNTER — Other Ambulatory Visit: Payer: Self-pay | Admitting: Adult Health

## 2020-04-09 ENCOUNTER — Ambulatory Visit: Payer: Medicare Other | Attending: Internal Medicine

## 2020-06-25 DIAGNOSIS — I4891 Unspecified atrial fibrillation: Secondary | ICD-10-CM

## 2020-06-25 HISTORY — DX: Unspecified atrial fibrillation: I48.91

## 2020-06-29 ENCOUNTER — Encounter (HOSPITAL_COMMUNITY): Payer: Self-pay | Admitting: *Deleted

## 2020-06-29 ENCOUNTER — Other Ambulatory Visit: Payer: Self-pay

## 2020-06-29 ENCOUNTER — Emergency Department (HOSPITAL_COMMUNITY): Payer: Medicare Other

## 2020-06-29 ENCOUNTER — Inpatient Hospital Stay (HOSPITAL_COMMUNITY)
Admission: EM | Admit: 2020-06-29 | Discharge: 2020-07-03 | DRG: 853 | Disposition: A | Discharge status: Home Health | Payer: Medicare Other | Attending: Family Medicine | Admitting: Family Medicine

## 2020-06-29 DIAGNOSIS — I361 Nonrheumatic tricuspid (valve) insufficiency: Secondary | ICD-10-CM | POA: Diagnosis not present

## 2020-06-29 DIAGNOSIS — N179 Acute kidney failure, unspecified: Secondary | ICD-10-CM | POA: Diagnosis present

## 2020-06-29 DIAGNOSIS — E1110 Type 2 diabetes mellitus with ketoacidosis without coma: Secondary | ICD-10-CM | POA: Diagnosis present

## 2020-06-29 DIAGNOSIS — E0810 Diabetes mellitus due to underlying condition with ketoacidosis without coma: Secondary | ICD-10-CM

## 2020-06-29 DIAGNOSIS — Z6837 Body mass index (BMI) 37.0-37.9, adult: Secondary | ICD-10-CM | POA: Diagnosis not present

## 2020-06-29 DIAGNOSIS — E871 Hypo-osmolality and hyponatremia: Secondary | ICD-10-CM | POA: Diagnosis present

## 2020-06-29 DIAGNOSIS — I10 Essential (primary) hypertension: Secondary | ICD-10-CM

## 2020-06-29 DIAGNOSIS — K819 Cholecystitis, unspecified: Secondary | ICD-10-CM

## 2020-06-29 DIAGNOSIS — I421 Obstructive hypertrophic cardiomyopathy: Secondary | ICD-10-CM | POA: Diagnosis present

## 2020-06-29 DIAGNOSIS — R946 Abnormal results of thyroid function studies: Secondary | ICD-10-CM | POA: Diagnosis present

## 2020-06-29 DIAGNOSIS — E0781 Sick-euthyroid syndrome: Secondary | ICD-10-CM | POA: Diagnosis present

## 2020-06-29 DIAGNOSIS — R739 Hyperglycemia, unspecified: Secondary | ICD-10-CM

## 2020-06-29 DIAGNOSIS — Z20822 Contact with and (suspected) exposure to covid-19: Secondary | ICD-10-CM | POA: Diagnosis present

## 2020-06-29 DIAGNOSIS — R6520 Severe sepsis without septic shock: Secondary | ICD-10-CM | POA: Diagnosis not present

## 2020-06-29 DIAGNOSIS — A419 Sepsis, unspecified organism: Principal | ICD-10-CM | POA: Diagnosis present

## 2020-06-29 DIAGNOSIS — K82A1 Gangrene of gallbladder in cholecystitis: Secondary | ICD-10-CM | POA: Diagnosis present

## 2020-06-29 DIAGNOSIS — R6510 Systemic inflammatory response syndrome (SIRS) of non-infectious origin without acute organ dysfunction: Secondary | ICD-10-CM

## 2020-06-29 DIAGNOSIS — Y92230 Patient room in hospital as the place of occurrence of the external cause: Secondary | ICD-10-CM | POA: Diagnosis not present

## 2020-06-29 DIAGNOSIS — Z8261 Family history of arthritis: Secondary | ICD-10-CM

## 2020-06-29 DIAGNOSIS — Z7902 Long term (current) use of antithrombotics/antiplatelets: Secondary | ICD-10-CM | POA: Diagnosis not present

## 2020-06-29 DIAGNOSIS — Z79899 Other long term (current) drug therapy: Secondary | ICD-10-CM | POA: Diagnosis not present

## 2020-06-29 DIAGNOSIS — D649 Anemia, unspecified: Secondary | ICD-10-CM | POA: Diagnosis present

## 2020-06-29 DIAGNOSIS — I639 Cerebral infarction, unspecified: Secondary | ICD-10-CM | POA: Diagnosis present

## 2020-06-29 DIAGNOSIS — N289 Disorder of kidney and ureter, unspecified: Secondary | ICD-10-CM

## 2020-06-29 DIAGNOSIS — K810 Acute cholecystitis: Secondary | ICD-10-CM | POA: Diagnosis present

## 2020-06-29 DIAGNOSIS — I350 Nonrheumatic aortic (valve) stenosis: Secondary | ICD-10-CM | POA: Diagnosis not present

## 2020-06-29 DIAGNOSIS — E669 Obesity, unspecified: Secondary | ICD-10-CM | POA: Diagnosis present

## 2020-06-29 DIAGNOSIS — I69351 Hemiplegia and hemiparesis following cerebral infarction affecting right dominant side: Secondary | ICD-10-CM

## 2020-06-29 DIAGNOSIS — E785 Hyperlipidemia, unspecified: Secondary | ICD-10-CM | POA: Diagnosis present

## 2020-06-29 DIAGNOSIS — W1830XA Fall on same level, unspecified, initial encounter: Secondary | ICD-10-CM | POA: Diagnosis not present

## 2020-06-29 DIAGNOSIS — I480 Paroxysmal atrial fibrillation: Secondary | ICD-10-CM | POA: Diagnosis present

## 2020-06-29 DIAGNOSIS — I340 Nonrheumatic mitral (valve) insufficiency: Secondary | ICD-10-CM | POA: Diagnosis not present

## 2020-06-29 LAB — BASIC METABOLIC PANEL
Anion gap: 16 — ABNORMAL HIGH (ref 5–15)
Anion gap: 14 (ref 5–15)
Anion gap: 11 (ref 5–15)
BUN: 42 mg/dL — ABNORMAL HIGH (ref 8–23)
BUN: 35 mg/dL — ABNORMAL HIGH (ref 8–23)
BUN: 26 mg/dL — ABNORMAL HIGH (ref 8–23)
CO2: 15 mmol/L — ABNORMAL LOW (ref 22–32)
CO2: 19 mmol/L — ABNORMAL LOW (ref 22–32)
CO2: 19 mmol/L — ABNORMAL LOW (ref 22–32)
Calcium: 8.2 mg/dL — ABNORMAL LOW (ref 8.9–10.3)
Calcium: 8.3 mg/dL — ABNORMAL LOW (ref 8.9–10.3)
Calcium: 8 mg/dL — ABNORMAL LOW (ref 8.9–10.3)
Chloride: 94 mmol/L — ABNORMAL LOW (ref 98–111)
Chloride: 98 mmol/L (ref 98–111)
Chloride: 101 mmol/L (ref 98–111)
Creatinine, Ser: 2.21 mg/dL — ABNORMAL HIGH (ref 0.44–1.00)
Creatinine, Ser: 1.73 mg/dL — ABNORMAL HIGH (ref 0.44–1.00)
Creatinine, Ser: 1.24 mg/dL — ABNORMAL HIGH (ref 0.44–1.00)
GFR calc Af Amer: 25 mL/min — ABNORMAL LOW (ref >60)
GFR calc Af Amer: 34 mL/min — ABNORMAL LOW (ref >60)
GFR calc Af Amer: 51 mL/min — ABNORMAL LOW (ref >60)
GFR calc non Af Amer: 22 mL/min — ABNORMAL LOW (ref >60)
GFR calc non Af Amer: 29 mL/min — ABNORMAL LOW (ref >60)
GFR calc non Af Amer: 44 mL/min — ABNORMAL LOW (ref >60)
Glucose, Bld: 393 mg/dL — ABNORMAL HIGH (ref 70–99)
Glucose, Bld: 146 mg/dL — ABNORMAL HIGH (ref 70–99)
Glucose, Bld: 136 mg/dL — ABNORMAL HIGH (ref 70–99)
Potassium: 4.5 mmol/L (ref 3.5–5.1)
Potassium: 3.7 mmol/L (ref 3.5–5.1)
Potassium: 3.1 mmol/L — ABNORMAL LOW (ref 3.5–5.1)
Sodium: 125 mmol/L — ABNORMAL LOW (ref 135–145)
Sodium: 131 mmol/L — ABNORMAL LOW (ref 135–145)
Sodium: 131 mmol/L — ABNORMAL LOW (ref 135–145)

## 2020-06-29 LAB — GLUCOSE, CAPILLARY
Glucose-Capillary: 317 mg/dL — ABNORMAL HIGH (ref 70–99)
Glucose-Capillary: 122 mg/dL — ABNORMAL HIGH (ref 70–99)
Glucose-Capillary: 110 mg/dL — ABNORMAL HIGH (ref 70–99)
Glucose-Capillary: 149 mg/dL — ABNORMAL HIGH (ref 70–99)
Glucose-Capillary: 165 mg/dL — ABNORMAL HIGH (ref 70–99)
Glucose-Capillary: 163 mg/dL — ABNORMAL HIGH (ref 70–99)
Glucose-Capillary: 171 mg/dL — ABNORMAL HIGH (ref 70–99)
Glucose-Capillary: 171 mg/dL — ABNORMAL HIGH (ref 70–99)
Glucose-Capillary: 153 mg/dL — ABNORMAL HIGH (ref 70–99)
Glucose-Capillary: 161 mg/dL — ABNORMAL HIGH (ref 70–99)
Glucose-Capillary: 133 mg/dL — ABNORMAL HIGH (ref 70–99)
Glucose-Capillary: 129 mg/dL — ABNORMAL HIGH (ref 70–99)
Glucose-Capillary: 125 mg/dL — ABNORMAL HIGH (ref 70–99)

## 2020-06-29 LAB — LIPASE, BLOOD: Lipase: 16 U/L (ref 11–51)

## 2020-06-29 LAB — COMPREHENSIVE METABOLIC PANEL
ALT: 23 U/L (ref 0–44)
AST: 26 U/L (ref 15–41)
Albumin: 2.9 g/dL — ABNORMAL LOW (ref 3.5–5.0)
Alkaline Phosphatase: 128 U/L — ABNORMAL HIGH (ref 38–126)
Anion gap: 17 — ABNORMAL HIGH (ref 5–15)
BUN: 38 mg/dL — ABNORMAL HIGH (ref 8–23)
CO2: 16 mmol/L — ABNORMAL LOW (ref 22–32)
Calcium: 8.9 mg/dL (ref 8.9–10.3)
Chloride: 91 mmol/L — ABNORMAL LOW (ref 98–111)
Creatinine, Ser: 2.03 mg/dL — ABNORMAL HIGH (ref 0.44–1.00)
GFR calc Af Amer: 28 mL/min — ABNORMAL LOW (ref >60)
GFR calc non Af Amer: 24 mL/min — ABNORMAL LOW (ref >60)
Glucose, Bld: 410 mg/dL — ABNORMAL HIGH (ref 70–99)
Potassium: 4.8 mmol/L (ref 3.5–5.1)
Sodium: 124 mmol/L — ABNORMAL LOW (ref 135–145)
Total Bilirubin: 2.3 mg/dL — ABNORMAL HIGH (ref 0.3–1.2)
Total Protein: 7.1 g/dL (ref 6.5–8.1)

## 2020-06-29 LAB — TYPE AND SCREEN
ABO/RH(D): A POS
Antibody Screen: NEGATIVE

## 2020-06-29 LAB — URINALYSIS, ROUTINE W REFLEX MICROSCOPIC
Bilirubin Urine: NEGATIVE
Glucose, UA: 50 mg/dL — AB
Ketones, ur: NEGATIVE mg/dL
Nitrite: NEGATIVE
Protein, ur: 30 mg/dL — AB
Specific Gravity, Urine: 1.01 (ref 1.005–1.030)
pH: 5 (ref 5.0–8.0)

## 2020-06-29 LAB — CBC
HCT: 45.2 % (ref 36.0–46.0)
Hemoglobin: 13.3 g/dL (ref 12.0–15.0)
MCH: 21.9 pg — ABNORMAL LOW (ref 26.0–34.0)
MCHC: 29.4 g/dL — ABNORMAL LOW (ref 30.0–36.0)
MCV: 74.5 fL — ABNORMAL LOW (ref 80.0–100.0)
Platelets: 204 10*3/uL (ref 150–400)
RBC: 6.07 MIL/uL — ABNORMAL HIGH (ref 3.87–5.11)
RDW: 13.6 % (ref 11.5–15.5)
WBC: 27.3 10*3/uL — ABNORMAL HIGH (ref 4.0–10.5)
nRBC: 0 % (ref 0.0–0.2)

## 2020-06-29 LAB — HEMOGLOBIN A1C
Hgb A1c MFr Bld: 12.7 % — ABNORMAL HIGH (ref 4.8–5.6)
Mean Plasma Glucose: 317.79 mg/dL

## 2020-06-29 LAB — LACTIC ACID, PLASMA
Lactic Acid, Venous: 2.6 mmol/L (ref 0.5–1.9)
Lactic Acid, Venous: 3.7 mmol/L (ref 0.5–1.9)
Lactic Acid, Venous: 2.9 mmol/L (ref 0.5–1.9)
Lactic Acid, Venous: 1.4 mmol/L (ref 0.5–1.9)

## 2020-06-29 LAB — SARS CORONAVIRUS 2 BY RT PCR (HOSPITAL ORDER, PERFORMED IN ~~LOC~~ HOSPITAL LAB): SARS Coronavirus 2: NEGATIVE

## 2020-06-29 LAB — APTT: aPTT: 35 seconds (ref 24–36)

## 2020-06-29 LAB — BETA-HYDROXYBUTYRIC ACID
Beta-Hydroxybutyric Acid: 2.4 mmol/L — ABNORMAL HIGH (ref 0.05–0.27)
Beta-Hydroxybutyric Acid: 2.19 mmol/L — ABNORMAL HIGH (ref 0.05–0.27)
Beta-Hydroxybutyric Acid: 0.2 mmol/L (ref 0.05–0.27)

## 2020-06-29 LAB — CBG MONITORING, ED: Glucose-Capillary: 431 mg/dL — ABNORMAL HIGH (ref 70–99)

## 2020-06-29 LAB — ABO/RH: ABO/RH(D): A POS

## 2020-06-29 LAB — PROTIME-INR
INR: 1.4 — ABNORMAL HIGH (ref 0.8–1.2)
Prothrombin Time: 16.3 seconds — ABNORMAL HIGH (ref 11.4–15.2)

## 2020-06-29 MED ORDER — LACTATED RINGERS IV BOLUS
500.0000 mL | Freq: Once | INTRAVENOUS | Status: AC
  Administered 2020-06-29: 500 mL via INTRAVENOUS

## 2020-06-29 MED ORDER — INSULIN REGULAR(HUMAN) IN NACL 100-0.9 UT/100ML-% IV SOLN
INTRAVENOUS | Status: DC
  Administered 2020-06-29: 10.5 [IU]/h via INTRAVENOUS
  Filled 2020-06-29: qty 100

## 2020-06-29 MED ORDER — INSULIN DETEMIR 100 UNIT/ML ~~LOC~~ SOLN
6.0000 [IU] | Freq: Two times a day (BID) | SUBCUTANEOUS | Status: DC
  Administered 2020-06-29 – 2020-07-02 (×6): 6 [IU] via SUBCUTANEOUS
  Filled 2020-06-29 (×7): qty 0.06

## 2020-06-29 MED ORDER — LACTATED RINGERS IV BOLUS (SEPSIS): 500.0000 mL | Freq: Once | INTRAVENOUS | Status: DC

## 2020-06-29 MED ORDER — SODIUM CHLORIDE 0.9% FLUSH
3.0000 mL | Freq: Once | INTRAVENOUS | Status: AC
  Administered 2020-06-29: 3 mL via INTRAVENOUS

## 2020-06-29 MED ORDER — ACETAMINOPHEN 325 MG PO TABS
650.0000 mg | ORAL_TABLET | Freq: Four times a day (QID) | ORAL | Status: DC | PRN
  Administered 2020-06-29 – 2020-06-30 (×2): 650 mg via ORAL
  Filled 2020-06-29 (×2): qty 2

## 2020-06-29 MED ORDER — DEXTROSE 50 % IV SOLN: 0.0000 mL | INTRAVENOUS | Status: DC | PRN

## 2020-06-29 MED ORDER — INSULIN REGULAR(HUMAN) IN NACL 100-0.9 UT/100ML-% IV SOLN: INTRAVENOUS | Status: DC

## 2020-06-29 MED ORDER — LACTATED RINGERS IV SOLN: INTRAVENOUS | Status: DC

## 2020-06-29 MED ORDER — POTASSIUM CHLORIDE 10 MEQ/100ML IV SOLN
10.0000 meq | INTRAVENOUS | Status: AC
  Administered 2020-06-29 – 2020-06-30 (×3): 10 meq via INTRAVENOUS
  Filled 2020-06-29 (×3): qty 100

## 2020-06-29 MED ORDER — LACTATED RINGERS IV BOLUS
1000.0000 mL | Freq: Once | INTRAVENOUS | Status: AC
  Administered 2020-06-29: 1000 mL via INTRAVENOUS

## 2020-06-29 MED ORDER — METRONIDAZOLE IN NACL 5-0.79 MG/ML-% IV SOLN
500.0000 mg | Freq: Three times a day (TID) | INTRAVENOUS | Status: DC
  Administered 2020-06-29 – 2020-07-03 (×13): 500 mg via INTRAVENOUS
  Filled 2020-06-29 (×13): qty 100

## 2020-06-29 MED ORDER — LABETALOL HCL 5 MG/ML IV SOLN: 10.0000 mg | INTRAVENOUS | Status: DC | PRN

## 2020-06-29 MED ORDER — DEXTROSE-NACL 5-0.45 % IV SOLN: INTRAVENOUS | Status: DC

## 2020-06-29 MED ORDER — SODIUM CHLORIDE 0.9 % IV SOLN: INTRAVENOUS | Status: DC

## 2020-06-29 MED ORDER — POTASSIUM CHLORIDE 10 MEQ/100ML IV SOLN: 10.0000 meq | INTRAVENOUS | Status: DC

## 2020-06-29 MED ORDER — ONDANSETRON HCL 4 MG/2ML IJ SOLN
4.0000 mg | Freq: Once | INTRAMUSCULAR | Status: AC
  Administered 2020-06-29: 4 mg via INTRAVENOUS
  Filled 2020-06-29: qty 2

## 2020-06-29 MED ORDER — SODIUM CHLORIDE 0.9 % IV SOLN
2.0000 g | INTRAVENOUS | Status: DC
  Administered 2020-06-30 – 2020-07-01 (×2): 2 g via INTRAVENOUS
  Filled 2020-06-29 (×2): qty 2

## 2020-06-29 MED ORDER — MORPHINE SULFATE (PF) 2 MG/ML IV SOLN: 1.0000 mg | INTRAVENOUS | Status: DC | PRN

## 2020-06-29 MED ORDER — FENTANYL CITRATE (PF) 100 MCG/2ML IJ SOLN
50.0000 ug | Freq: Once | INTRAMUSCULAR | Status: AC
  Administered 2020-06-29: 50 ug via INTRAVENOUS
  Filled 2020-06-29: qty 2

## 2020-06-29 MED ORDER — SODIUM CHLORIDE 0.9 % IV SOLN
2.0000 g | Freq: Once | INTRAVENOUS | Status: AC
  Administered 2020-06-29: 2 g via INTRAVENOUS
  Filled 2020-06-29: qty 2

## 2020-06-29 MED ORDER — LACTATED RINGERS IV BOLUS (SEPSIS)
1000.0000 mL | Freq: Once | INTRAVENOUS | Status: AC
  Administered 2020-06-29: 1000 mL via INTRAVENOUS

## 2020-06-29 MED ORDER — INSULIN ASPART 100 UNIT/ML ~~LOC~~ SOLN
0.0000 [IU] | SUBCUTANEOUS | Status: DC
  Administered 2020-06-30: 1 [IU] via SUBCUTANEOUS
  Administered 2020-06-30: 3 [IU] via SUBCUTANEOUS
  Administered 2020-06-30 – 2020-07-01 (×3): 1 [IU] via SUBCUTANEOUS
  Administered 2020-07-01: 3 [IU] via SUBCUTANEOUS
  Administered 2020-07-01: 4 [IU] via SUBCUTANEOUS
  Administered 2020-07-01: 2 [IU] via SUBCUTANEOUS
  Administered 2020-07-01 – 2020-07-02 (×4): 3 [IU] via SUBCUTANEOUS
  Administered 2020-07-02: 1 [IU] via SUBCUTANEOUS

## 2020-06-29 MED ORDER — METRONIDAZOLE IN NACL 5-0.79 MG/ML-% IV SOLN
500.0000 mg | Freq: Once | INTRAVENOUS | Status: AC
  Administered 2020-06-29: 500 mg via INTRAVENOUS
  Filled 2020-06-29: qty 100

## 2020-06-29 NOTE — Progress Notes (Signed)
Pharmacy Antibiotic Note  Samantha Paul is a 74 y.o. female admitted on 06/29/2020 with abdominal pain and cholecystitis.  Pharmacy has been consulted for Cefepime dosing.  Plan: Cefepime 2 g IV q24h F/U renal function  Height: 4\' 10"  (147.3 cm) Weight: 81.6 kg (180 lb) IBW/kg (Calculated) : 40.9  Temp (24hrs), Avg:99.6 F (37.6 C), Min:98.9 F (37.2 C), Max:100.3 F (37.9 C)  Recent Labs  Lab 06/29/20 0105 06/29/20 0315  WBC 27.3*  --   CREATININE 2.03*  --   LATICACIDVEN  --  2.6*    Estimated Creatinine Clearance: 23 mL/min (A) (by C-G formula based on SCr of 2.03 mg/dL (H)).    Allergies  Allergen Reactions  . Metformin And Related     Abdominal pain and diarrhea     08/30/20 06/29/2020 5:58 AM

## 2020-06-29 NOTE — Consult Note (Signed)
Reason for Consult:cholecystitis Referring Physician: April Palumbo  Samantha Paul is an 74 y.o. female.  HPI: 74yo F presents to the emergency department with 3-day history of upper abdominal pain and vomiting.  She is [place] and her granddaughter is present and able to translate.  She reports decreased appetite and nausea.  Work-up in the emergency department showed cholecystitis, hyperglycemia, and acute kidney injury.  I was asked to see her for surgical management of cholecystitis.  She does have a known history of diabetes mellitus, hypertension, and previous stroke.  Past Medical History:  Diagnosis Date  . Back pain   . HTN (hypertension)   . Knee pain   . Stroke [facility])     History reviewed. No pertinent surgical history.  Family History  Problem Relation Age of Onset  . Arthritis Brother     Social History:  reports that she has never smoked. She has never used smokeless tobacco. She reports previous alcohol use. She reports previous drug use.  Allergies:  Allergies  Allergen Reactions  . Metformin And Related     Abdominal pain and diarrhea    Medications: I have reviewed the patient's current medications.  Results for orders placed or performed during the hospital encounter of 06/29/20 (from the past 48 hour(s))  Lipase, blood     Status: None   Collection Time: 06/29/20  1:05 AM  Result Value Ref Range   Lipase 16 11 - 51 U/L    Comment: Performed at [facility], [address] [place], [place] [address]  Comprehensive metabolic panel     Status: Abnormal   Collection Time: 06/29/20  1:05 AM  Result Value Ref Range   Sodium 124 (L) 135 - 145 mmol/L   Potassium 4.8 3.5 - 5.1 mmol/L   Chloride 91 (L) 98 - 111 mmol/L   CO2 16 (L) 22 - 32 mmol/L   Glucose, Bld 410 (H) 70 - 99 mg/dL    Comment: Glucose reference range applies only to samples taken after fasting for at least 8 hours.   BUN 38 (H) 8 - 23 mg/dL   Creatinine, Ser 1.19 (H) 0.44 - 1.00 mg/dL    Calcium 8.9 8.9 - 41.7 mg/dL   Total Protein 7.1 6.5 - 8.1 g/dL   Albumin 2.9 (L) 3.5 - 5.0 g/dL   AST 26 15 - 41 U/L   ALT 23 0 - 44 U/L   Alkaline Phosphatase 128 (H) 38 - 126 U/L   Total Bilirubin 2.3 (H) 0.3 - 1.2 mg/dL   GFR calc non Af Amer 24 (L) >60 mL/min   GFR calc Af Amer 28 (L) >60 mL/min   Anion gap 17 (H) 5 - 15    Comment: Performed at [facility], [address], [place], [place] [address]  CBC     Status: Abnormal   Collection Time: 06/29/20  1:05 AM  Result Value Ref Range   WBC 27.3 (H) 4.0 - 10.5 K/uL   RBC 6.07 (H) 3.87 - 5.11 MIL/uL   Hemoglobin 13.3 12.0 - 15.0 g/dL   HCT 48.1 36 - 46 %   MCV 74.5 (L) 80.0 - 100.0 fL   MCH 21.9 (L) 26.0 - 34.0 pg   MCHC 29.4 (L) 30.0 - 36.0 g/dL   RDW 85.6 31.4 - 97.0 %   Platelets 204 150 - 400 K/uL   nRBC 0.0 0.0 - 0.2 %    Comment: Performed at [facility], [address], [place], [place] [address]  Lactic acid, plasma     Status: Abnormal   Collection Time: 06/29/20  3:15 AM  Result Value Ref Range   Lactic Acid, Venous 2.6 (HH) 0.5 - 1.9 mmol/L    Comment: CRITICAL RESULT CALLED TO, READ BACK BY AND VERIFIED WITH: Starleen Blue 06/29/20 0410 WAYK Performed at [facility], [address] [place], [place] [address]   APTT     Status: None   Collection Time: 06/29/20  3:18 AM  Result Value Ref Range   aPTT 35 24 - 36 seconds    Comment: Performed at [facility], [address], [place], [place] [address]  Protime-INR     Status: Abnormal   Collection Time: 06/29/20  3:18 AM  Result Value Ref Range   Prothrombin Time 16.3 (H) 11.4 - 15.2 seconds   INR 1.4 (H) 0.8 - 1.2    Comment: (NOTE) INR goal varies based on device and disease states. Performed at [facility], [address] [place], [place] [address]     DG Chest 1 View  Result Date: 06/29/2020 CLINICAL DATA:  74 year old female with history of sepsis. EXAM: CHEST  1 VIEW COMPARISON:  Chest x-ray 07/10/2018. FINDINGS:  Mild elevation of the right hemidiaphragm. Lung volumes are normal. No consolidative airspace disease. No pleural effusions. No pneumothorax. No evidence of pulmonary edema. Mild cardiomegaly, similar to the prior examination. Upper mediastinal contours are within normal limits. IMPRESSION: 1. No radiographic evidence of acute cardiopulmonary disease. 2. Mild cardiomegaly. 3. Mild elevation of the right hemidiaphragm. Electronically Signed   By: Trudie Reed M.D.   On: 06/29/2020 04:46   CT Renal Stone Study  Result Date: 06/29/2020 CLINICAL DATA:  74 year old female with history of elevated white blood cell count. Bilateral flank pain. Nausea and vomiting. EXAM: CT ABDOMEN AND PELVIS WITHOUT CONTRAST TECHNIQUE: Multidetector CT imaging of the abdomen and pelvis was performed following the standard protocol without IV contrast. COMPARISON:  No priors. FINDINGS: Lower chest: Calcifications of the mitral annulus. Hepatobiliary: No definite cystic or solid hepatic lesions are confidently identified on today's noncontrast CT examination. Large calcified gallstone measuring approximately 2.8 x 4.5 x 2.2 cm located in the gallbladder neck. Gallbladder appears moderately distended, with gallbladder wall thickening and haziness and trace volume of fluid adjacent to the gallbladder, concerning for acute cholecystitis. Pancreas: No definite pancreatic mass or peripancreatic fluid collections or inflammatory changes are noted on today's noncontrast CT examination. Spleen: Unremarkable. Adrenals/Urinary Tract: Low-attenuation lesions in the right kidney, incompletely characterized on today's non-contrast CT examination, but statistically likely to represent cysts, measuring up to 5.1 cm in the upper pole. Unenhanced appearance of the left kidney and bilateral adrenal glands is normal. No hydroureteronephrosis. Urinary bladder is normal in appearance. Stomach/Bowel: Unenhanced appearance of the stomach is normal. There is  no pathologic dilatation of small bowel or colon. Normal appendix. Vascular/Lymphatic: Aortic atherosclerosis. No lymphadenopathy noted in the abdomen or pelvis. Reproductive: Uterus and ovaries are atrophic. Other: No significant volume of ascites.  No pneumoperitoneum. Musculoskeletal: There are no aggressive appearing lytic or blastic lesions noted in the visualized portions of the skeleton. IMPRESSION: 1. Findings are highly concerning for acute cholecystitis, as detailed above. Surgical consultation is recommended. 2. Aortic atherosclerosis. Electronically Signed   By: Trudie Reed M.D.   On: 06/29/2020 04:41    Review of Systems  Constitutional: Positive for appetite change.  HENT: Negative.   Eyes: Negative.   Respiratory:  Negative.   Cardiovascular: Negative.   Gastrointestinal: Positive for abdominal pain and nausea. Negative for diarrhea.  Endocrine: Negative.   Genitourinary: Negative.   Musculoskeletal: Negative.   Skin: Negative.   Allergic/Immunologic: Negative.   Neurological: Negative.   Hematological: Negative.   Psychiatric/Behavioral: Negative.    Blood pressure 119/85, pulse (!) 123, temperature 100.3 F (37.9 C), temperature source Oral, resp. rate (!) 24, height 4\' 10"  (1.473 m), weight 81.6 kg, SpO2 97 %. Physical Exam Constitutional:      General: She is not in acute distress.    Appearance: She is well-developed. She is not diaphoretic.  HENT:     Head: Normocephalic.     Mouth/Throat:     Mouth: Mucous membranes are moist.  Eyes:     General: Scleral icterus present.     Extraocular Movements: Extraocular movements intact.  Cardiovascular:     Rate and Rhythm: Regular rhythm. Tachycardia present.     Heart sounds: No murmur heard.   Pulmonary:     Effort: Pulmonary effort is normal.     Breath sounds: No wheezing or rhonchi.     Comments: RR 24 Abdominal:     General: Abdomen is flat. Bowel sounds are decreased.     Palpations: Abdomen is soft.  There is no hepatomegaly or splenomegaly.     Tenderness: There is abdominal tenderness in the right upper quadrant. There is no guarding or rebound.     Hernia: No hernia is present.  Skin:    General: Skin is warm and dry.     Capillary Refill: Capillary refill takes 2 to 3 seconds.  Neurological:     Mental Status: She is alert.     Comments: Follows commands  Psychiatric:        Mood and Affect: Mood normal.     Assessment/Plan: Cholecystitis -IV fluids, IV bowel rest, she has been started on IV cefepime and Flagyl. Plan laparoscopic cholecystectomy this admission once medically stabilized.  I discussed the procedure, risks, and benefits.  Acute kidney injury and uncontrolled diabetes mellitus per primary team, appreciate their assistance.  06/29/2020, 5:27 AM

## 2020-06-29 NOTE — Progress Notes (Signed)
CRITICAL VALUE ALERT  Critical Value:  Lactic Acid 3.7  Date & Time Notied:  06/29/20 0830  Provider Notified: Dr.Powell   Orders Received/Actions taken: waiting for new orders   Everlean Cherry, RN

## 2020-06-29 NOTE — ED Provider Notes (Signed)
[facility] EMERGENCY DEPARTMENT Provider Note   CSN: [id number] Arrival date & time: 06/29/20  0044     History Chief Complaint  Patient presents with  . Abdominal Pain    Samantha Paul is a 74 y.o. female.  The history is provided by the patient. The history is limited by a language barrier (family had to translate ).  Abdominal Pain Pain location:  Generalized (R>L) Pain quality: aching   Pain radiates to:  Does not radiate Pain severity:  Severe Onset quality:  Gradual Duration:  3 days Timing:  Constant Progression:  Unchanged Chronicity:  New Context: not trauma   Relieved by:  Nothing Worsened by:  Nothing Ineffective treatments:  None tried Associated symptoms: nausea and vomiting   Associated symptoms: no anorexia, no chest pain, no fever and no shortness of breath   Risk factors: being elderly   Risk factors: no alcohol abuse   Patient is covid vaccinated.       Past Medical History:  Diagnosis Date  . Back pain   . HTN (hypertension)   . Knee pain   . Stroke [facility])     Patient Active Problem List   Diagnosis Date Noted  . Hemiparesis affecting right side as late effect of stroke ([place])   . Acute blood loss anemia   . Left pontine stroke ([place]) 07/13/2018  . Essential hypertension   . Diabetes mellitus type 2 in nonobese (HCC)   . CVA (cerebral vascular accident) ([place]) 07/10/2018  . Hypertensive urgency 07/10/2018  . Hyperglycemia 07/10/2018  . Right sided weakness 07/10/2018  . Hyperlipidemia     History reviewed. No pertinent surgical history.   OB History   No obstetric history on file.     Family History  Problem Relation Age of Onset  . Arthritis Brother     Social History   Tobacco Use  . Smoking status: Never Smoker  . Smokeless tobacco: Never Used  Substance Use Topics  . Alcohol use: Not Currently  . Drug use: Not Currently    Home Medications Prior to Admission medications   Medication Sig Start Date End  Date Taking? Authorizing Provider  acetaminophen (TYLENOL) 325 MG tablet Take 1-2 tablets (325-650 mg total) by mouth every 4 (four) hours as needed for mild pain. 07/25/18   Love, Ivan Anchors, PA-C  amLODipine (NORVASC) 10 MG tablet Take 1 tablet (10 mg total) by mouth daily. 07/25/18   Love, Ivan Anchors, PA-C  blood glucose meter kit and supplies KIT Dispense based on patient and insurance preference. Use up to four times daily as directed. (FOR ICD-10--E11.9). 07/25/18   Love, Ivan Anchors, PA-C  clopidogrel (PLAVIX) 75 MG tablet Take 1 tablet (75 mg total) by mouth daily. 07/25/18   Love, Ivan Anchors, PA-C  glimepiride (AMARYL) 1 MG tablet Take 1 tablet (1 mg total) by mouth daily with breakfast. 07/25/18   Love, Ivan Anchors, PA-C  losartan (COZAAR) 50 MG tablet Take 1 tablet (50 mg total) by mouth daily. 07/25/18   Love, Ivan Anchors, PA-C  metoprolol tartrate (LOPRESSOR) 25 MG tablet Take 1 tablet (25 mg total) by mouth 2 (two) times daily. 07/25/18   Love, Ivan Anchors, PA-C  rosuvastatin (CRESTOR) 20 MG tablet TAKE 1 TABLET(20 MG) BY MOUTH DAILY 12/20/18   Frann Rider, NP  senna-docusate (SENOKOT-S) 8.6-50 MG tablet Take 2 tablets by mouth at bedtime. 07/25/18   Love, Ivan Anchors, PA-C    Allergies    Metformin and related  Review of Systems   Review of Systems  Constitutional: Negative for fever.  HENT: Negative for congestion.   Eyes: Negative for visual disturbance.  Respiratory: Negative for shortness of breath.   Cardiovascular: Negative for chest pain.  Gastrointestinal: Positive for abdominal pain, nausea and vomiting. Negative for anorexia.  Genitourinary: Negative for difficulty urinating.  Musculoskeletal: Negative for arthralgias.  Skin: Negative for rash.  Neurological: Negative for dizziness.  Psychiatric/Behavioral: Negative for agitation.  All other systems reviewed and are negative.   Physical Exam Updated Vital Signs BP 119/85 (BP Location: Right Arm)   Pulse (!) 123   Temp 100.3 F (37.9  C) (Oral)   Resp (!) 24   Ht _0  (1.473 m)   Wt 81.6 kg   SpO2 97%   BMI 37.62 kg/m   Physical Exam Vitals and nursing note reviewed.  Constitutional:      Appearance: Normal appearance. She is not diaphoretic.  HENT:     Head: Normocephalic and atraumatic.     Nose: Nose normal.  Eyes:     Conjunctiva/sclera: Conjunctivae normal.     Pupils: Pupils are equal, round, and reactive to light.  Cardiovascular:     Rate and Rhythm: Normal rate and regular rhythm.     Pulses: Normal pulses.     Heart sounds: Normal heart sounds.  Pulmonary:     Effort: Pulmonary effort is normal.     Breath sounds: Normal breath sounds.  Abdominal:     General: Bowel sounds are normal.     Palpations: Abdomen is soft.     Tenderness: There is abdominal tenderness in the right upper quadrant and epigastric area. There is guarding.  Musculoskeletal:        General: Normal range of motion.     Cervical back: Normal range of motion and neck supple.  Skin:    General: Skin is warm and dry.     Capillary Refill: Capillary refill takes less than 2 seconds.  Neurological:     General: No focal deficit present.     Mental Status: She is alert and oriented to person, place, and time.  Psychiatric:        Mood and Affect: Mood normal.        Behavior: Behavior normal.     ED Results / Procedures / Treatments   Labs (all labs ordered are listed, but only abnormal results are displayed) Results for orders placed or performed during the hospital encounter of 06/29/20  Lipase, blood  Result Value Ref Range   Lipase 16 11 - 51 U/L  Comprehensive metabolic panel  Result Value Ref Range   Sodium 124 (L) 135 - 145 mmol/L   Potassium 4.8 3.5 - 5.1 mmol/L   Chloride 91 (L) 98 - 111 mmol/L   CO2 16 (L) 22 - 32 mmol/L   Glucose, Bld 410 (H) 70 - 99 mg/dL   BUN 38 (H) 8 - 23 mg/dL   Creatinine, Ser 2.03 (H) 0.44 - 1.00 mg/dL   Calcium 8.9 8.9 - 10.3 mg/dL   Total Protein 7.1 6.5 - 8.1 g/dL    Albumin 2.9 (L) 3.5 - 5.0 g/dL   AST 26 15 - 41 U/L   ALT 23 0 - 44 U/L   Alkaline Phosphatase 128 (H) 38 - 126 U/L   Total Bilirubin 2.3 (H) 0.3 - 1.2 mg/dL   GFR calc non Af Amer 24 (L) >60 mL/min   GFR calc Af Amer 28 (L) >60 mL/min  Anion gap 17 (H) 5 - 15  CBC  Result Value Ref Range   WBC 27.3 (H) 4.0 - 10.5 K/uL   RBC 6.07 (H) 3.87 - 5.11 MIL/uL   Hemoglobin 13.3 12.0 - 15.0 g/dL   HCT 45.2 36 - 46 %   MCV 74.5 (L) 80.0 - 100.0 fL   MCH 21.9 (L) 26.0 - 34.0 pg   MCHC 29.4 (L) 30.0 - 36.0 g/dL   RDW 13.6 11.5 - 15.5 %   Platelets 204 150 - 400 K/uL   nRBC 0.0 0.0 - 0.2 %   No results found.  EKG None  Radiology No results found.  Procedures Procedures (including critical care time)  Medications Ordered in ED Medications  sodium chloride flush (NS) 0.9 % injection 3 mL (has no administration in time range)  lactated ringers bolus 1,000 mL (has no administration in time range)    And  lactated ringers bolus 1,000 mL (has no administration in time range)    And  lactated ringers bolus 500 mL (has no administration in time range)  ceFEPIme (MAXIPIME) 2 g in sodium chloride 0.9 % 100 mL IVPB (has no administration in time range)  metroNIDAZOLE (FLAGYL) IVPB 500 mg (has no administration in time range)  insulin regular, human (MYXREDLIN) 100 units/ 100 mL infusion (has no administration in time range)  0.9 %  sodium chloride infusion (has no administration in time range)  dextrose 5 %-0.45 % sodium chloride infusion (has no administration in time range)  dextrose 50 % solution 0-50 mL (has no administration in time range)  potassium chloride 10 mEq in 100 mL IVPB (has no administration in time range)  fentaNYL (SUBLIMAZE) injection 50 mcg (50 mcg Intravenous Given 06/29/20 0453)  ondansetron (ZOFRAN) injection 4 mg (4 mg Intravenous Given 06/29/20 0450)    MDM Interpretation: labs, ECG, x-ray and CT scan Total time providing critical care: 75-105 minutes. This  excludes time spent performing separately reportable procedures and services.  CRITICAL CARE Performed by: Mairlyn Tegtmeyer K Candice Lunney-Rasch Total critical care time: 75 minutes Critical care time was exclusive of separately billable procedures and treating other patients. Critical care was necessary to treat or prevent imminent or life-threatening deterioration. Critical care was time spent personally by me on the following activities: development of treatment plan with patient and/or surrogate as well as nursing, discussions with consultants, evaluation of patient's response to treatment, examination of patient, obtaining history from patient or surrogate, ordering and performing treatments and interventions, ordering and review of laboratory studies, ordering and review of radiographic studies, pulse oximetry and re-evaluation of patient's condition.  ED Course  I have reviewed the triage vital signs and the nursing notes.  Pertinent labs & imaging results that were available during my care of the patient were reviewed by me and considered in my medical decision making (see chart for details).     Patient has been given pain medication and started on sepsis antibiotics.  CT concerning for acute cholecystitis, ultrasound ordered  Glucose is elevated with mild anion gap.  Given better outcomes with tight glucose control insulin drip initiated.    No septic shock, not given 30 bolus   Case d/w Dr. Grandville Silos who will see the patient in consult, please admit to medicine.   Final Clinical Impression(s) / ED Diagnoses Admit to medicine.     Mishon Blubaugh, MD 06/29/20 [phone]

## 2020-06-29 NOTE — ED Notes (Signed)
Dr. Nicanor Alcon notified of Latic Acid values.Also off the floor to imaging

## 2020-06-29 NOTE — Sepsis Progress Note (Signed)
Notified bedside nurse of need to draw repeat lactic acid. 

## 2020-06-29 NOTE — Progress Notes (Addendum)
Inpatient Diabetes Program Recommendations  AACE/ADA: New Consensus Statement on Inpatient Glycemic Control (2015)  Target Ranges:  Prepandial:   less than 140 mg/dL      Peak postprandial:   less than 180 mg/dL (1-2 hours)      Critically ill patients:  140 - 180 mg/dL   Lab Results  Component Value Date   GLUCAP 317 (H) 06/29/2020   HGBA1C 12.7 (H) 06/29/2020    Review of Glycemic Control Results for Samantha Paul, Samantha Paul (MRN [id number]) as of 06/29/2020 08:52  Ref. Range 06/29/2020 06:43 06/29/2020 08:31  Glucose-Capillary Latest Ref Range: 70 - 99 mg/dL 431 (H) 540 (H)   Diabetes history: DM 2 Outpatient Diabetes medications:  Amaryl 1 mg daily Current orders for Inpatient glycemic control:  IV insulin  Inpatient Diabetes Program Recommendations:    Note patient admitted with Sepsis and DKA.  A1C indicates average blood sugar 317 mg/dL prior to admit.  It appears that patient will need insulin at d/c.  Currently patient is on IV insulin.  Once acidosis cleared, consider transition to Levemir 12 units daily (0.15 units/kg) and very sensitive correction (0-6 units) q 4 hours.   Will follow.  Note probable need for surgery as well.  Will need to discuss with patient and family and discuss probable need for insulin at d/c.   Thanks  Beryl Meager, RN, BC-ADM Inpatient Diabetes Coordinator Pager [phone] (8a-5p)  Addendum 1450: Attempted to talk to patient regarding DM.  Son was at bedside and unable to speak [place].  He called his daughter who asked her dad if they are checking patient's blood sugars at home and if she has ever been on insulin.  He is unsure and states that his father is coming soon and should know.  Had granddaughter ask him to please discuss with his father and to let RN know.  It does appear that patient will need insulin however may be difficult to teach.  Would be helpful to have an in person interpreter for patient/family teaching.  Called and spoke with interpreter  services and received pager number for after hours needs for Samantha Paul [phone].  They will also assist in getting in person interpreter for 06/30/20.

## 2020-06-29 NOTE — H&P (Signed)
History and Physical    Samantha Paul ITG:[id number] DOB: Nov 20, 1946 DOA: 06/29/2020  PCP: Elwyn Reach, MD  Patient coming from: Home.  Patient's daughter provided translation.  Patient speaks [place] language.  Chief Complaint: Abdominal pain.  HPI: Samantha Paul is a 74 y.o. female with history of stroke with right-sided hemiplegia, hypertension diabetes mellitus has been experiencing abdominal pain for the last 3 days.  Had one episode of vomiting.  Denies any fever chills or diarrhea.  Pain is mostly in the upper quadrants of the abdomen.  Since pain got worse patient came to the ER.  ED Course: In the ER patient was febrile with temperature of 100.3 F tachycardic in the 120s with lab work showing WBC count of 27.3 lactic acid of 2.6 creatinine increased from baseline of normal 2 years ago to 2.03 blood glucose 410 bicarb 16 sodium 124 lipase 16 AST ALT was normal alkaline phos 128 total bilirubin 2.3 INR 1.4 CT renal study was done which shows features concerning for acute cholecystitis and general surgery was consulted.  Lab work was showing possible DKA for which patient was started IV insulin infusion fluids.  And empiric antibiotics for possible sepsis from acute cholecystitis.  EKG shows tachycardia.  Review of Systems: As per HPI, rest all negative.   Past Medical History:  Diagnosis Date  . Back pain   . HTN (hypertension)   . Knee pain   . Stroke [facility])     History reviewed. No pertinent surgical history.   reports that she has never smoked. She has never used smokeless tobacco. She reports previous alcohol use. She reports previous drug use.  Allergies  Allergen Reactions  . Metformin And Related     Abdominal pain and diarrhea    Family History  Problem Relation Age of Onset  . Arthritis Brother     Prior to Admission medications   Medication Sig Start Date End Date Taking? Authorizing Provider  acetaminophen (TYLENOL) 325 MG tablet Take 1-2 tablets (325-650  mg total) by mouth every 4 (four) hours as needed for mild pain. 07/25/18   Love, Ivan Anchors, PA-C  amLODipine (NORVASC) 10 MG tablet Take 1 tablet (10 mg total) by mouth daily. 07/25/18   Love, Ivan Anchors, PA-C  blood glucose meter kit and supplies KIT Dispense based on patient and insurance preference. Use up to four times daily as directed. (FOR ICD-10--E11.9). 07/25/18   Love, Ivan Anchors, PA-C  clopidogrel (PLAVIX) 75 MG tablet Take 1 tablet (75 mg total) by mouth daily. 07/25/18   Love, Ivan Anchors, PA-C  glimepiride (AMARYL) 1 MG tablet Take 1 tablet (1 mg total) by mouth daily with breakfast. 07/25/18   Love, Ivan Anchors, PA-C  losartan (COZAAR) 50 MG tablet Take 1 tablet (50 mg total) by mouth daily. 07/25/18   Love, Ivan Anchors, PA-C  metoprolol tartrate (LOPRESSOR) 25 MG tablet Take 1 tablet (25 mg total) by mouth 2 (two) times daily. 07/25/18   Love, Ivan Anchors, PA-C  rosuvastatin (CRESTOR) 20 MG tablet TAKE 1 TABLET(20 MG) BY MOUTH DAILY Patient taking differently: Take 20 mg by mouth daily.  12/20/18   Frann Rider, NP  senna-docusate (SENOKOT-S) 8.6-50 MG tablet Take 2 tablets by mouth at bedtime. 07/25/18   Bary Leriche, PA-C    Physical Exam: Constitutional: Moderately built and nourished. Vitals:   06/29/20 0054 06/29/20 0055 06/29/20 0103 06/29/20 0440  BP: 119/85     Pulse: (!) 123     Resp: Marland Kitchen)  24     Temp: 98.9 F (37.2 C)  100.3 F (37.9 C)   TempSrc: Oral  Oral   SpO2: 97%     Weight:  81.6 kg    Height:  _0  (1.473 m)  _1  (1.473 m)   Eyes: Anicteric no pallor. ENMT: No discharge from the ears eyes nose or mouth. Neck: No mass felt.  No neck rigidity. Respiratory: No rhonchi or crepitations. Cardiovascular: S1-S2 heard. Abdomen: Soft nontender at this time since patient received medicine for pain.  No guarding or rigidity. Musculoskeletal: No edema. Skin: No rash. Neurologic: Alert awake oriented time place and person.  Eyes hemiplegia from previous stroke. Psychiatric:  Appears normal.   Labs on Admission: I have personally reviewed following labs and imaging studies  CBC: Recent Labs  Lab 06/29/20 0105  WBC 27.3*  HGB 13.3  HCT 45.2  MCV 74.5*  PLT 353   Basic Metabolic Panel: Recent Labs  Lab 06/29/20 0105  NA 124*  K 4.8  CL 91*  CO2 16*  GLUCOSE 410*  BUN 38*  CREATININE 2.03*  CALCIUM 8.9   GFR: Estimated Creatinine Clearance: 23 mL/min (A) (by C-G formula based on SCr of 2.03 mg/dL (H)). Liver Function Tests: Recent Labs  Lab 06/29/20 0105  AST 26  ALT 23  ALKPHOS 128*  BILITOT 2.3*  PROT 7.1  ALBUMIN 2.9*   Recent Labs  Lab 06/29/20 0105  LIPASE 16   No results for input(s): AMMONIA in the last 168 hours. Coagulation Profile: Recent Labs  Lab 06/29/20 0318  INR 1.4*   Cardiac Enzymes: No results for input(s): CKTOTAL, CKMB, CKMBINDEX, TROPONINI in the last 168 hours. BNP (last 3 results) No results for input(s): PROBNP in the last 8760 hours. HbA1C: No results for input(s): HGBA1C in the last 72 hours. CBG: No results for input(s): GLUCAP in the last 168 hours. Lipid Profile: No results for input(s): CHOL, HDL, LDLCALC, TRIG, CHOLHDL, LDLDIRECT in the last 72 hours. Thyroid Function Tests: No results for input(s): TSH, T4TOTAL, FREET4, T3FREE, THYROIDAB in the last 72 hours. Anemia Panel: No results for input(s): VITAMINB12, FOLATE, FERRITIN, TIBC, IRON, RETICCTPCT in the last 72 hours. Urine analysis: No results found for: COLORURINE, APPEARANCEUR, LABSPEC, PHURINE, GLUCOSEU, HGBUR, BILIRUBINUR, KETONESUR, PROTEINUR, UROBILINOGEN, NITRITE, LEUKOCYTESUR Sepsis Labs: _2 (procalcitonin:4,lacticidven:4) )No results found for this or any previous visit (from the past 240 hour(s)).   Radiological Exams on Admission: DG Chest 1 View  Result Date: 06/29/2020 CLINICAL DATA:  74 year old female with history of sepsis. EXAM: CHEST  1 VIEW COMPARISON:  Chest x-ray 07/10/2018. FINDINGS: Mild elevation of  the right hemidiaphragm. Lung volumes are normal. No consolidative airspace disease. No pleural effusions. No pneumothorax. No evidence of pulmonary edema. Mild cardiomegaly, similar to the prior examination. Upper mediastinal contours are within normal limits. IMPRESSION: 1. No radiographic evidence of acute cardiopulmonary disease. 2. Mild cardiomegaly. 3. Mild elevation of the right hemidiaphragm. Electronically Signed   By: Vinnie Langton M.D.   On: 06/29/2020 04:46   CT Renal Stone Study  Result Date: 06/29/2020 CLINICAL DATA:  74 year old female with history of elevated white blood cell count. Bilateral flank pain. Nausea and vomiting. EXAM: CT ABDOMEN AND PELVIS WITHOUT CONTRAST TECHNIQUE: Multidetector CT imaging of the abdomen and pelvis was performed following the standard protocol without IV contrast. COMPARISON:  No priors. FINDINGS: Lower chest: Calcifications of the mitral annulus. Hepatobiliary: No definite cystic or solid hepatic lesions are confidently identified on today's noncontrast CT examination. Large  calcified gallstone measuring approximately 2.8 x 4.5 x 2.2 cm located in the gallbladder neck. Gallbladder appears moderately distended, with gallbladder wall thickening and haziness and trace volume of fluid adjacent to the gallbladder, concerning for acute cholecystitis. Pancreas: No definite pancreatic mass or peripancreatic fluid collections or inflammatory changes are noted on today's noncontrast CT examination. Spleen: Unremarkable. Adrenals/Urinary Tract: Low-attenuation lesions in the right kidney, incompletely characterized on today's non-contrast CT examination, but statistically likely to represent cysts, measuring up to 5.1 cm in the upper pole. Unenhanced appearance of the left kidney and bilateral adrenal glands is normal. No hydroureteronephrosis. Urinary bladder is normal in appearance. Stomach/Bowel: Unenhanced appearance of the stomach is normal. There is no pathologic  dilatation of small bowel or colon. Normal appendix. Vascular/Lymphatic: Aortic atherosclerosis. No lymphadenopathy noted in the abdomen or pelvis. Reproductive: Uterus and ovaries are atrophic. Other: No significant volume of ascites.  No pneumoperitoneum. Musculoskeletal: There are no aggressive appearing lytic or blastic lesions noted in the visualized portions of the skeleton. IMPRESSION: 1. Findings are highly concerning for acute cholecystitis, as detailed above. Surgical consultation is recommended. 2. Aortic atherosclerosis. Electronically Signed   By: Vinnie Langton M.D.   On: 06/29/2020 04:41    EKG: Independently reviewed.  Ectopic tachycardia.  Assessment/Plan Principal Problem:   Sepsis ([place]) Active Problems:   Left pontine stroke ([place])   Essential hypertension   DKA (diabetic ketoacidoses) ([place])   ARF (acute renal failure) ([place])    1. Sepsis likely source intra-abdominal concerning for acute cholecystitis for which we have consulted general surgery and sonogram abdomen is pending empiric antibiotics have been started kept n.p.o.  Further recommendation per general surgery. 2. Diabetic ketoacidosis likely precipitated from infectious source.  Patient is on IV insulin infusion fluids follow metabolic panel closely.  Per family patient has been compliant with her home medications. 3. Acute renal failure likely from sepsis and poor oral intake last 3 to 4 days due to abdominal pain.  Had also 1 episode of vomiting.  UA is pending.  Continue aggressive hydration I think will fluid patient's renal failure will improve. 4. History of stroke with right-sided hemiplegia presently n.p.o. 5. Hypertension we will keep patient as needed IV labetalol for now since patient is n.p.o. 6. Hyponatremia likely from hyperglycemia for metabolic panel.  Given that patient has sepsis with possible cholecystitis with DKA acute renal failure will need close monitoring for any further worsening in  inpatient status.   DVT prophylaxis: Avoiding anticoagulants in anticipation of procedure.  On SCDs. Code Status: Full code. Family Communication: Patient's daughter and husband at the bedside. Disposition Plan: Home. Consults called: General surgery. Admission status: Inpatient.   Rise Patience MD [facility] Pager [phone].  If 7PM-7AM, please contact night-coverage [email or web address] Password [facility]  06/29/2020, 5:54 AM

## 2020-06-29 NOTE — Progress Notes (Signed)
@  2130, Dr. Antionette Char, on-call for Attending paged regarding pt's K+ of 3.1. Page returned and IV replacement ordered. Will administer and continue to monitor.

## 2020-06-29 NOTE — Progress Notes (Addendum)
PROGRESS NOTE    Samantha Paul  AYT:[id number] DOB: [birth year] DOA: 06/29/2020 PCP: Samantha Emery, MD   Chief Complaint  Patient presents with  . Abdominal Pain    Brief Narrative:  Samantha Paul is Samantha Paul 74 y.o. female with history of stroke with right-sided hemiplegia, hypertension diabetes mellitus has been experiencing abdominal pain for the last 3 days.  Had one episode of vomiting.  Denies any fever chills or diarrhea.  Pain is mostly in the upper quadrants of the abdomen.  Since pain got worse patient came to the ER.  ED Course: In the ER patient was febrile with temperature of 100.3 F tachycardic in the 120s with lab work showing WBC count of 27.3 lactic acid of 2.6 creatinine increased from baseline of normal 2 years ago to 2.03 blood glucose 410 bicarb 16 sodium 124 lipase 16 AST ALT was normal alkaline phos 128 total bilirubin 2.3 INR 1.4 CT renal study was done which shows features concerning for acute cholecystitis and general surgery was consulted.  Lab work was showing possible DKA for which patient was started IV insulin infusion fluids.  And empiric antibiotics for possible sepsis from acute cholecystitis.  EKG shows tachycardia.  Assessment & Plan:   Principal Problem:   Sepsis (HCC) Active Problems:   Left pontine stroke (HCC)   Essential hypertension   DKA (diabetic ketoacidoses) (HCC)   ARF (acute renal failure) (HCC)  Language barrier.  Patient speaks montangnard Samantha Paul - family interpreted today, appreciate Arlee Muslim who found after hours pager for Samantha Paul [phone]) and they're going to assist with in person interpreter on 7/6.    Addendum: previous echo noted, findings concerning for hocm.  will discuss previous echo findings of hocm with cardiology prior to surgery, will call 7/6 am  1. Sepsis  Acute cholecystitis: imaging concerning for cholecystitis.  CT abd/pelvis and RUQ [place].  CXR without acute cardiopulm disease.  Tachypneic, tachycardic,  leukocytosis, febrile, meeting sepsis criteria.  1. Gen surgery c/s, recommending IVF, bowel rest, abx - plan for lap chole once medically stabilized 2. Continue cefepime/flagyl  3. Lactic acidosis is gradually improving, continue IVF    4. Leukocytosis - WBC 27 -> follow   2. Diabetic ketoacidosis likely precipitated from infectious source.   1. Continue insulin gtt  2. A1c, 12.7 3. Will need insulin at discharge  3. Acute renal failure likely from sepsis and poor oral intake last 3 to 4 days due to abdominal pain.  Had also 1 episode of vomiting.   1. UA is pending. 2. Continue IVF 3. No hydro on abdominal imaging   4. History of stroke with right-sided hemiplegia presently n.p.o.  Holding plavix and crestor for now while NPO  5. Hypertension - holding cozaar with AKI.  Holding amlodipine and metoprolol for now with sepsis.  Labetalol prn.   6. Hyponatremia likely from hyperglycemia, continue to monitor  DVT prophylaxis: SCD Code Status: full  Family Communication: son, daughter in law, granddaughter Disposition:   Status is: Inpatient  Remains inpatient appropriate because:Inpatient level of care appropriate due to severity of illness   Dispo: The patient is from: Home              Anticipated d/c is to: Home              Anticipated d/c date is: > 3 days              Patient currently is not medically stable to d/c.  Consultants:  surgery  Procedures:   none  Antimicrobials:  Anti-infectives (From admission, onward)   Start     Dose/Rate Route Frequency Ordered Stop   06/30/20 0800  ceFEPIme (MAXIPIME) 2 g in sodium chloride 0.9 % 100 mL IVPB     Discontinue     2 g 200 mL/hr over 30 Minutes Intravenous Every 24 hours 06/29/20 0553     06/29/20 1400  metroNIDAZOLE (FLAGYL) IVPB 500 mg     Discontinue     500 mg 100 mL/hr over 60 Minutes Intravenous Every 8 hours 06/29/20 0553     06/29/20 0315  ceFEPIme (MAXIPIME) 2 g in sodium chloride 0.9 % 100 mL IVPB         2 g 200 mL/hr over 30 Minutes Intravenous  Once 06/29/20 0301 06/29/20 0603   06/29/20 0315  metroNIDAZOLE (FLAGYL) IVPB 500 mg        500 mg 100 mL/hr over 60 Minutes Intravenous  Once 06/29/20 0301 06/29/20 0620      Subjective: Family used to help interpret (lack of [place] interpreter available at time of my evaluation) C/o some abdominal discomfort, malaise  Objective: Vitals:   06/29/20 0738 06/29/20 0753 06/29/20 0828 06/29/20 1155  BP:   122/68 (!) 158/85  Pulse: 97  92 (!) 105  Resp: (!) 22  20 (!) 24  Temp:  (!) 101.2 F (38.4 C) 99.1 F (37.3 C) (!) 100.6 F (38.1 C)  TempSrc:  Rectal Oral Oral  SpO2: 99%  97% 99%  Weight:      Height:        Intake/Output Summary (Last 24 hours) at 06/29/2020 1627 Last data filed at 06/29/2020 1500 Gross per 24 hour  Intake 374.51 ml  Output --  Net 374.51 ml   Filed Weights   06/29/20 0055  Weight: 81.6 kg    Examination:  General exam: Appears calm and comfortable  Respiratory system: tachypnea, clear Cardiovascular system: tachycardia, RR Gastrointestinal system: Abdomen is nondistended, soft and mildly TTP Central nervous system: Alert and oriented. No focal neurological deficits. Extremities: no LEE Skin: No rashes, lesions or ulcers Psychiatry: Judgement and insight appear normal. Mood & affect appropriate.     Data Reviewed: I have personally reviewed following labs and imaging studies  CBC: Recent Labs  Lab 06/29/20 0105  WBC 27.3*  HGB 13.3  HCT 45.2  MCV 74.5*  PLT 204    Basic Metabolic Panel: Recent Labs  Lab 06/29/20 0105 06/29/20 0734 06/29/20 1306  NA 124* 125* 131*  K 4.8 4.5 3.7  CL 91* 94* 98  CO2 16* 15* 19*  GLUCOSE 410* 393* 146*  BUN 38* 42* 35*  CREATININE 2.03* 2.21* 1.73*  CALCIUM 8.9 8.2* 8.3*    GFR: Estimated Creatinine Clearance: 26.9 mL/min (Samantha Paul) (by C-G formula based on SCr of 1.73 mg/dL (H)).  Liver Function Tests: Recent Labs  Lab 06/29/20 0105   AST 26  ALT 23  ALKPHOS 128*  BILITOT 2.3*  PROT 7.1  ALBUMIN 2.9*    CBG: Recent Labs  Lab 06/29/20 1153 06/29/20 1300 06/29/20 1424 06/29/20 1439 06/29/20 1549  GLUCAP 110* 149* 165* 163* 171*     Recent Results (from the past 240 hour(s))  Blood culture (routine x 2)     Status: None (Preliminary result)   Collection Time: 06/29/20  3:50 AM   Specimen: BLOOD RIGHT ARM  Result Value Ref Range Status   Specimen Description BLOOD RIGHT ARM  Final   Special Requests  Final    BOTTLES DRAWN AEROBIC AND ANAEROBIC Blood Culture results may not be optimal due to an inadequate volume of blood received in culture bottles   Culture   Final    NO GROWTH < 12 HOURS Performed at [facility], [address] [place], [place] [address]    Report Status PENDING  Incomplete  SARS Coronavirus 2 by RT PCR (hospital order, performed in [facility] hospital lab) Nasopharyngeal Nasopharyngeal Swab     Status: None   Collection Time: 06/29/20  5:00 AM   Specimen: Nasopharyngeal Swab  Result Value Ref Range Status   SARS Coronavirus 2 NEGATIVE NEGATIVE Final    Comment: (NOTE) SARS-CoV-2 target nucleic acids are NOT DETECTED.  The SARS-CoV-2 RNA is generally detectable in upper and lower respiratory specimens during the acute phase of infection. The lowest concentration of SARS-CoV-2 viral copies this assay can detect is 250 copies / mL. Ariyan Brisendine negative result does not preclude SARS-CoV-2 infection and should not be used as the sole basis for treatment or other patient management decisions.  Lindzie Boxx negative result may occur with improper specimen collection / handling, submission of specimen other than nasopharyngeal swab, presence of viral mutation(s) within the areas targeted by this assay, and inadequate number of viral copies (<250 copies / mL). Anmol Fleck negative result must be combined with clinical observations, patient history, and epidemiological information.  Fact Sheet for Patients:    BoilerBrush.com.cy  Fact Sheet for Healthcare Providers: https://pope.com/  This test is not yet approved or  cleared by the [place] FDA and has been authorized for detection and/or diagnosis of SARS-CoV-2 by FDA under an Emergency Use Authorization (EUA).  This EUA will remain in effect (meaning this test can be used) for the duration of the COVID-19 declaration under Section 564(b)(1) of the Act, 21 U.S.C. section 360bbb-3(b)(1), unless the authorization is terminated or revoked sooner.  Performed at [facility], [address] [place], [place] [address]          Radiology Studies: DG Chest 1 View  Result Date: 06/29/2020 CLINICAL DATA:  74 year old female with history of sepsis. EXAM: CHEST  1 VIEW COMPARISON:  Chest x-ray 07/10/2018. FINDINGS: Mild elevation of the right hemidiaphragm. Lung volumes are normal. No consolidative airspace disease. No pleural effusions. No pneumothorax. No evidence of pulmonary edema. Mild cardiomegaly, similar to the prior examination. Upper mediastinal contours are within normal limits. IMPRESSION: 1. No radiographic evidence of acute cardiopulmonary disease. 2. Mild cardiomegaly. 3. Mild elevation of the right hemidiaphragm. Electronically Signed   By: Trudie Reed M.D.   On: 06/29/2020 04:46   US Abdomen Limited  Result Date: 06/29/2020 CLINICAL DATA:  74 year old female with severe abdominal pain for the past 3 days. Possible cholecystitis and noted on recent CT examination. EXAM: ULTRASOUND ABDOMEN LIMITED RIGHT UPPER QUADRANT COMPARISON:  No prior abdominal ultrasound. CT the abdomen and pelvis 06/29/2020. FINDINGS: Gallbladder: Large echogenic structure with posterior acoustic shadowing measuring 4.2 cm in diameter in the neck of the gallbladder. Gallbladder is moderately distended and there are low-level internal echoes suggesting biliary sludge. Gallbladder wall appears thickened  and edematous measuring 8 mm. Trace amount of pericholecystic fluid. Per report from the sonographer, the patient did not exhibit Maclin Guerrette sonographic Murphy's sign on examination. Common bile duct: Diameter: 4 mm Liver: No focal lesion identified. Within normal limits in parenchymal echogenicity. Portal vein is patent on color Doppler imaging with normal direction of blood flow towards the liver. Other: None. IMPRESSION:  1. Study is positive for cholelithiasis and biliary sludge with Jalil Lorusso distended gallbladder with thickened edematous wall. Per report from the sonographer, the patient did not exhibit Tatiyana Foucher sonographic Murphy's sign on examination, which could be related to recent administration of pain medication. Otherwise, imaging findings are highly concerning for acute cholecystitis. Surgical consultation is recommended. Electronically Signed   By: Trudie Reed M.D.   On: 06/29/2020 06:36   CT Renal Stone Study  Result Date: 06/29/2020 CLINICAL DATA:  74 year old female with history of elevated white blood cell count. Bilateral flank pain. Nausea and vomiting. EXAM: CT ABDOMEN AND PELVIS WITHOUT CONTRAST TECHNIQUE: Multidetector CT imaging of the abdomen and pelvis was performed following the standard protocol without IV contrast. COMPARISON:  No priors. FINDINGS: Lower chest: Calcifications of the mitral annulus. Hepatobiliary: No definite cystic or solid hepatic lesions are confidently identified on today's noncontrast CT examination. Large calcified gallstone measuring approximately 2.8 x 4.5 x 2.2 cm located in the gallbladder neck. Gallbladder appears moderately distended, with gallbladder wall thickening and haziness and trace volume of fluid adjacent to the gallbladder, concerning for acute cholecystitis. Pancreas: No definite pancreatic mass or peripancreatic fluid collections or inflammatory changes are noted on today's noncontrast CT examination. Spleen: Unremarkable. Adrenals/Urinary Tract: Low-attenuation  lesions in the right kidney, incompletely characterized on today's non-contrast CT examination, but statistically likely to represent cysts, measuring up to 5.1 cm in the upper pole. Unenhanced appearance of the left kidney and bilateral adrenal glands is normal. No hydroureteronephrosis. Urinary bladder is normal in appearance. Stomach/Bowel: Unenhanced appearance of the stomach is normal. There is no pathologic dilatation of small bowel or colon. Normal appendix. Vascular/Lymphatic: Aortic atherosclerosis. No lymphadenopathy noted in the abdomen or pelvis. Reproductive: Uterus and ovaries are atrophic. Other: No significant volume of ascites.  No pneumoperitoneum. Musculoskeletal: There are no aggressive appearing lytic or blastic lesions noted in the visualized portions of the skeleton. IMPRESSION: 1. Findings are highly concerning for acute cholecystitis, as detailed above. Surgical consultation is recommended. 2. Aortic atherosclerosis. Electronically Signed   By: Trudie Reed M.D.   On: 06/29/2020 04:41        Scheduled Meds: Continuous Infusions: . sodium chloride    . [START ON 06/30/2020] ceFEPime (MAXIPIME) IV    . dextrose 5 % and 0.45% NaCl 75 mL/hr at 06/29/20 1500  . insulin 2.4 Units/hr (06/29/20 1553)  . lactated ringers    . lactated ringers    . lactated ringers    . metronidazole 100 mL/hr at 06/29/20 1500     LOS: 0 days    Time spent: over 30 min    Lacretia Nicks, MD [facility]   To contact the attending provider between 7A-7P or the covering provider during after hours 7P-7A, please log into the web site [email or web address] and access using universal [place] password for that web site. If you do not have the password, please call the hospital operator.  06/29/2020, 4:27 PM

## 2020-06-29 NOTE — Progress Notes (Signed)
Pt arrived from unit from ED code sepsis . VS BP 122/68 (79) Sp02  97 RA  HR 103 .Will continue to monitor. Pt. Oriented to unit    Everlean Cherry, RN

## 2020-06-30 ENCOUNTER — Encounter (HOSPITAL_COMMUNITY)
Admission: EM | Disposition: A | Discharge status: Home Health | Payer: Self-pay | Source: Home / Self Care | Attending: Family Medicine

## 2020-06-30 ENCOUNTER — Inpatient Hospital Stay (HOSPITAL_COMMUNITY): Payer: Medicare Other

## 2020-06-30 ENCOUNTER — Encounter (HOSPITAL_COMMUNITY): Payer: Self-pay | Admitting: Internal Medicine

## 2020-06-30 HISTORY — PX: CHOLECYSTECTOMY: SHX55

## 2020-06-30 LAB — COMPREHENSIVE METABOLIC PANEL
ALT: 17 U/L (ref 0–44)
AST: 23 U/L (ref 15–41)
Albumin: 2 g/dL — ABNORMAL LOW (ref 3.5–5.0)
Alkaline Phosphatase: 100 U/L (ref 38–126)
Anion gap: 12 (ref 5–15)
BUN: 22 mg/dL (ref 8–23)
CO2: 19 mmol/L — ABNORMAL LOW (ref 22–32)
Calcium: 8.1 mg/dL — ABNORMAL LOW (ref 8.9–10.3)
Chloride: 100 mmol/L (ref 98–111)
Creatinine, Ser: 1.19 mg/dL — ABNORMAL HIGH (ref 0.44–1.00)
GFR calc Af Amer: 53 mL/min — ABNORMAL LOW (ref >60)
GFR calc non Af Amer: 46 mL/min — ABNORMAL LOW (ref >60)
Glucose, Bld: 160 mg/dL — ABNORMAL HIGH (ref 70–99)
Potassium: 4.1 mmol/L (ref 3.5–5.1)
Sodium: 131 mmol/L — ABNORMAL LOW (ref 135–145)
Total Bilirubin: 1.3 mg/dL — ABNORMAL HIGH (ref 0.3–1.2)
Total Protein: 5.6 g/dL — ABNORMAL LOW (ref 6.5–8.1)

## 2020-06-30 LAB — CBC WITH DIFFERENTIAL/PLATELET
Abs Immature Granulocytes: 0.13 10*3/uL — ABNORMAL HIGH (ref 0.00–0.07)
Basophils Absolute: 0 10*3/uL (ref 0.0–0.1)
Basophils Relative: 0 %
Eosinophils Absolute: 0 10*3/uL (ref 0.0–0.5)
Eosinophils Relative: 0 %
HCT: 35.8 % — ABNORMAL LOW (ref 36.0–46.0)
Hemoglobin: 10.8 g/dL — ABNORMAL LOW (ref 12.0–15.0)
Immature Granulocytes: 1 %
Lymphocytes Relative: 6 %
Lymphs Abs: 1.1 10*3/uL (ref 0.7–4.0)
MCH: 22 pg — ABNORMAL LOW (ref 26.0–34.0)
MCHC: 30.2 g/dL (ref 30.0–36.0)
MCV: 72.8 fL — ABNORMAL LOW (ref 80.0–100.0)
Monocytes Absolute: 1.5 10*3/uL — ABNORMAL HIGH (ref 0.1–1.0)
Monocytes Relative: 8 %
Neutro Abs: 16.7 10*3/uL — ABNORMAL HIGH (ref 1.7–7.7)
Neutrophils Relative %: 85 %
Platelets: 136 10*3/uL — ABNORMAL LOW (ref 150–400)
RBC: 4.92 MIL/uL (ref 3.87–5.11)
RDW: 13.6 % (ref 11.5–15.5)
WBC: 19.5 10*3/uL — ABNORMAL HIGH (ref 4.0–10.5)
nRBC: 0 % (ref 0.0–0.2)

## 2020-06-30 LAB — GLUCOSE, CAPILLARY
Glucose-Capillary: 181 mg/dL — ABNORMAL HIGH (ref 70–99)
Glucose-Capillary: 186 mg/dL — ABNORMAL HIGH (ref 70–99)
Glucose-Capillary: 151 mg/dL — ABNORMAL HIGH (ref 70–99)
Glucose-Capillary: 158 mg/dL — ABNORMAL HIGH (ref 70–99)
Glucose-Capillary: 193 mg/dL — ABNORMAL HIGH (ref 70–99)
Glucose-Capillary: 257 mg/dL — ABNORMAL HIGH (ref 70–99)

## 2020-06-30 LAB — IRON AND TIBC
Iron: 6 ug/dL — ABNORMAL LOW (ref 28–170)
Saturation Ratios: 3 % — ABNORMAL LOW (ref 10.4–31.8)
TIBC: 197 ug/dL — ABNORMAL LOW (ref 250–450)
UIBC: 191 ug/dL

## 2020-06-30 LAB — PHOSPHORUS: Phosphorus: 2.3 mg/dL — ABNORMAL LOW (ref 2.5–4.6)

## 2020-06-30 LAB — MAGNESIUM: Magnesium: 1.5 mg/dL — ABNORMAL LOW (ref 1.7–2.4)

## 2020-06-30 LAB — VITAMIN B12: Vitamin B-12: 601 pg/mL (ref 180–914)

## 2020-06-30 LAB — FOLATE: Folate: 23 ng/mL (ref >5.9)

## 2020-06-30 LAB — FERRITIN: Ferritin: 192 ng/mL (ref 11–307)

## 2020-06-30 SURGERY — LAPAROSCOPIC CHOLECYSTECTOMY WITH INTRAOPERATIVE CHOLANGIOGRAM
Anesthesia: General | Site: Abdomen

## 2020-06-30 MED ORDER — ROCURONIUM BROMIDE 10 MG/ML (PF) SYRINGE
PREFILLED_SYRINGE | INTRAVENOUS | Status: AC
  Filled 2020-06-30: qty 10

## 2020-06-30 MED ORDER — K PHOS MONO-SOD PHOS DI & MONO 155-852-130 MG PO TABS
500.0000 mg | ORAL_TABLET | Freq: Once | ORAL | Status: AC
  Administered 2020-06-30: 500 mg via ORAL
  Filled 2020-06-30: qty 2

## 2020-06-30 MED ORDER — SUGAMMADEX SODIUM 200 MG/2ML IV SOLN
INTRAVENOUS | Status: DC | PRN
Start: 2020-06-30 — End: 2020-06-30
  Administered 2020-06-30: 200 mg via INTRAVENOUS

## 2020-06-30 MED ORDER — LIDOCAINE 2% (20 MG/ML) 5 ML SYRINGE
INTRAMUSCULAR | Status: AC
  Filled 2020-06-30: qty 5

## 2020-06-30 MED ORDER — METOPROLOL TARTRATE 12.5 MG HALF TABLET
12.5000 mg | ORAL_TABLET | Freq: Two times a day (BID) | ORAL | Status: DC
  Administered 2020-06-30 – 2020-07-03 (×7): 12.5 mg via ORAL
  Filled 2020-06-30 (×7): qty 1

## 2020-06-30 MED ORDER — ONDANSETRON HCL 4 MG/2ML IJ SOLN: 4.0000 mg | Freq: Once | INTRAMUSCULAR | Status: DC | PRN

## 2020-06-30 MED ORDER — MAGNESIUM SULFATE 2 GM/50ML IV SOLN
2.0000 g | Freq: Once | INTRAVENOUS | Status: AC
  Administered 2020-06-30: 2 g via INTRAVENOUS
  Filled 2020-06-30: qty 50

## 2020-06-30 MED ORDER — STERILE WATER FOR IRRIGATION IR SOLN
Status: DC | PRN
  Administered 2020-06-30: 1000 mL

## 2020-06-30 MED ORDER — DILTIAZEM HCL-DEXTROSE 125-5 MG/125ML-% IV SOLN (PREMIX)
5.0000 mg/h | INTRAVENOUS | Status: DC
  Administered 2020-06-30: 5 mg/h via INTRAVENOUS
  Administered 2020-07-01: 10 mg/h via INTRAVENOUS
  Filled 2020-06-30 (×2): qty 125

## 2020-06-30 MED ORDER — ONDANSETRON HCL 4 MG/2ML IJ SOLN
INTRAMUSCULAR | Status: AC
  Filled 2020-06-30: qty 2

## 2020-06-30 MED ORDER — CHLORHEXIDINE GLUCONATE 0.12 % MT SOLN: 15.0000 mL | Freq: Once | OROMUCOSAL | Status: AC

## 2020-06-30 MED ORDER — SODIUM CHLORIDE 0.9 % IR SOLN
Status: DC | PRN
  Administered 2020-06-30: 1000 mL

## 2020-06-30 MED ORDER — HEMOSTATIC AGENTS (NO CHARGE) OPTIME
TOPICAL | Status: DC | PRN
  Administered 2020-06-30: 1 via TOPICAL

## 2020-06-30 MED ORDER — ONDANSETRON HCL 4 MG/2ML IJ SOLN
INTRAMUSCULAR | Status: DC | PRN
  Administered 2020-06-30: 4 mg via INTRAVENOUS

## 2020-06-30 MED ORDER — BUPIVACAINE-EPINEPHRINE 0.25% -1:200000 IJ SOLN: INTRAMUSCULAR | Status: DC | PRN

## 2020-06-30 MED ORDER — OXYCODONE HCL 5 MG PO TABS
5.0000 mg | ORAL_TABLET | ORAL | Status: DC | PRN
  Administered 2020-06-30: 5 mg via ORAL
  Administered 2020-07-01: 10 mg via ORAL
  Filled 2020-06-30: qty 2
  Filled 2020-06-30: qty 1

## 2020-06-30 MED ORDER — LACTATED RINGERS IV SOLN: INTRAVENOUS | Status: DC

## 2020-06-30 MED ORDER — PROPOFOL 10 MG/ML IV BOLUS
INTRAVENOUS | Status: DC | PRN
  Administered 2020-06-30: 120 mg via INTRAVENOUS
  Administered 2020-06-30: 30 mg via INTRAVENOUS

## 2020-06-30 MED ORDER — METOPROLOL TARTRATE 5 MG/5ML IV SOLN
5.0000 mg | INTRAVENOUS | Status: DC | PRN
  Administered 2020-06-30: 5 mg via INTRAVENOUS
  Filled 2020-06-30 (×2): qty 5

## 2020-06-30 MED ORDER — LIDOCAINE 2% (20 MG/ML) 5 ML SYRINGE
INTRAMUSCULAR | Status: DC | PRN
  Administered 2020-06-30: 100 mg via INTRAVENOUS

## 2020-06-30 MED ORDER — LACTATED RINGERS IV SOLN: INTRAVENOUS | Status: DC | PRN

## 2020-06-30 MED ORDER — FENTANYL CITRATE (PF) 250 MCG/5ML IJ SOLN
INTRAMUSCULAR | Status: AC
  Filled 2020-06-30: qty 5

## 2020-06-30 MED ORDER — PHENYLEPHRINE 40 MCG/ML (10ML) SYRINGE FOR IV PUSH (FOR BLOOD PRESSURE SUPPORT)
PREFILLED_SYRINGE | INTRAVENOUS | Status: DC | PRN
  Administered 2020-06-30: 80 ug via INTRAVENOUS

## 2020-06-30 MED ORDER — FENTANYL CITRATE (PF) 100 MCG/2ML IJ SOLN
INTRAMUSCULAR | Status: AC
  Filled 2020-06-30: qty 2

## 2020-06-30 MED ORDER — LACTATED RINGERS IV BOLUS
500.0000 mL | Freq: Once | INTRAVENOUS | Status: AC
  Administered 2020-06-30: 500 mL via INTRAVENOUS

## 2020-06-30 MED ORDER — ROCURONIUM BROMIDE 10 MG/ML (PF) SYRINGE
PREFILLED_SYRINGE | INTRAVENOUS | Status: DC | PRN
  Administered 2020-06-30: 20 mg via INTRAVENOUS
  Administered 2020-06-30: 50 mg via INTRAVENOUS
  Administered 2020-06-30: 10 mg via INTRAVENOUS

## 2020-06-30 MED ORDER — FENTANYL CITRATE (PF) 100 MCG/2ML IJ SOLN: 25.0000 ug | INTRAMUSCULAR | Status: DC | PRN

## 2020-06-30 MED ORDER — FENTANYL CITRATE (PF) 100 MCG/2ML IJ SOLN
INTRAMUSCULAR | Status: DC | PRN
  Administered 2020-06-30: 50 ug via INTRAVENOUS

## 2020-06-30 MED ORDER — ORAL CARE MOUTH RINSE: 15.0000 mL | Freq: Once | OROMUCOSAL | Status: AC

## 2020-06-30 MED ORDER — DEXAMETHASONE SODIUM PHOSPHATE 10 MG/ML IJ SOLN
INTRAMUSCULAR | Status: DC | PRN
Start: 2020-06-30 — End: 2020-06-30
  Administered 2020-06-30: 5 mg via INTRAVENOUS

## 2020-06-30 MED ORDER — CHLORHEXIDINE GLUCONATE 0.12 % MT SOLN
OROMUCOSAL | Status: AC
  Administered 2020-06-30: 15 mL via OROMUCOSAL
  Filled 2020-06-30: qty 15

## 2020-06-30 MED ORDER — METOPROLOL TARTRATE 5 MG/5ML IV SOLN: 5.0000 mg | INTRAVENOUS | Status: DC | PRN

## 2020-06-30 MED ORDER — 0.9 % SODIUM CHLORIDE (POUR BTL) OPTIME
TOPICAL | Status: DC | PRN
  Administered 2020-06-30 (×2): 1000 mL

## 2020-06-30 MED ORDER — BUPIVACAINE HCL (PF) 0.25 % IJ SOLN
INTRAMUSCULAR | Status: AC
  Filled 2020-06-30: qty 30

## 2020-06-30 MED ORDER — BUPIVACAINE HCL (PF) 0.25 % IJ SOLN
INTRAMUSCULAR | Status: DC | PRN
  Administered 2020-06-30: 30 mL

## 2020-06-30 MED ORDER — DEXAMETHASONE SODIUM PHOSPHATE 10 MG/ML IJ SOLN
INTRAMUSCULAR | Status: AC
  Filled 2020-06-30: qty 1

## 2020-06-30 SURGICAL SUPPLY — 52 items
APPLIER CLIP 5 13 M/L LIGAMAX5 (MISCELLANEOUS) ×3
BIOPATCH RED 1 DISK 7.0 (GAUZE/BANDAGES/DRESSINGS) ×2 IMPLANT
BIOPATCH RED 1IN DISK 7.0MM (GAUZE/BANDAGES/DRESSINGS) ×1
BLADE CLIPPER SURG (BLADE) IMPLANT
CANISTER SUCT 3000ML PPV (MISCELLANEOUS) ×3 IMPLANT
CHLORAPREP W/TINT 26 (MISCELLANEOUS) ×3 IMPLANT
CLIP APPLIE 5 13 M/L LIGAMAX5 (MISCELLANEOUS) ×1 IMPLANT
COVER MAYO STAND STRL (DRAPES) ×3 IMPLANT
COVER SURGICAL LIGHT HANDLE (MISCELLANEOUS) ×3 IMPLANT
COVER WAND RF STERILE (DRAPES) ×3 IMPLANT
DERMABOND ADVANCED (GAUZE/BANDAGES/DRESSINGS) ×2
DERMABOND ADVANCED .7 DNX12 (GAUZE/BANDAGES/DRESSINGS) ×1 IMPLANT
DRAIN CHANNEL 19F RND (DRAIN) ×3 IMPLANT
DRAPE C-ARM 42X120 X-RAY (DRAPES) IMPLANT
DRSG TEGADERM 4X4.75 (GAUZE/BANDAGES/DRESSINGS) ×3 IMPLANT
ELECT REM PT RETURN 9FT ADLT (ELECTROSURGICAL) ×3
ELECTRODE REM PT RTRN 9FT ADLT (ELECTROSURGICAL) ×1 IMPLANT
EVACUATOR SILICONE 100CC (DRAIN) ×3 IMPLANT
FILTER SMOKE EVAC LAPAROSHD (FILTER) IMPLANT
GLOVE BIO SURGEON STRL SZ8 (GLOVE) ×3 IMPLANT
GLOVE BIOGEL PI IND STRL 8 (GLOVE) ×1 IMPLANT
GLOVE BIOGEL PI INDICATOR 8 (GLOVE) ×2
GOWN STRL REUS W/ TWL LRG LVL3 (GOWN DISPOSABLE) ×2 IMPLANT
GOWN STRL REUS W/ TWL XL LVL3 (GOWN DISPOSABLE) ×1 IMPLANT
GOWN STRL REUS W/TWL LRG LVL3 (GOWN DISPOSABLE) ×4
GOWN STRL REUS W/TWL XL LVL3 (GOWN DISPOSABLE) ×2
HEMOSTAT SNOW SURGICEL 2X4 (HEMOSTASIS) ×3 IMPLANT
KIT BASIN OR (CUSTOM PROCEDURE TRAY) ×3 IMPLANT
KIT TURNOVER KIT B (KITS) ×3 IMPLANT
L-HOOK LAP DISP 36CM (ELECTROSURGICAL) ×3
LHOOK LAP DISP 36CM (ELECTROSURGICAL) ×1 IMPLANT
NEEDLE 22X1 1/2 (OR ONLY) (NEEDLE) ×3 IMPLANT
NS IRRIG 1000ML POUR BTL (IV SOLUTION) ×3 IMPLANT
PAD ARMBOARD 7.5X6 YLW CONV (MISCELLANEOUS) ×3 IMPLANT
PENCIL BUTTON HOLSTER BLD 10FT (ELECTRODE) ×3 IMPLANT
POUCH RETRIEVAL ECOSAC 10 (ENDOMECHANICALS) ×1 IMPLANT
POUCH RETRIEVAL ECOSAC 10MM (ENDOMECHANICALS) ×2
SCISSORS LAP 5X35 DISP (ENDOMECHANICALS) ×3 IMPLANT
SET CHOLANGIOGRAPH 5 50 .035 (SET/KITS/TRAYS/PACK) ×3 IMPLANT
SET IRRIG TUBING LAPAROSCOPIC (IRRIGATION / IRRIGATOR) ×3 IMPLANT
SET TUBE SMOKE EVAC HIGH FLOW (TUBING) ×3 IMPLANT
SLEEVE ENDOPATH XCEL 5M (ENDOMECHANICALS) ×6 IMPLANT
SPECIMEN JAR SMALL (MISCELLANEOUS) ×3 IMPLANT
SUT ETHILON 2 0 FS 18 (SUTURE) ×3 IMPLANT
SUT VIC AB 4-0 PS2 27 (SUTURE) ×3 IMPLANT
SUT VICRYL 0 UR6 27IN ABS (SUTURE) ×3 IMPLANT
TOWEL GREEN STERILE (TOWEL DISPOSABLE) ×3 IMPLANT
TOWEL GREEN STERILE FF (TOWEL DISPOSABLE) ×3 IMPLANT
TRAY LAPAROSCOPIC MC (CUSTOM PROCEDURE TRAY) ×3 IMPLANT
TROCAR XCEL BLUNT TIP 100MML (ENDOMECHANICALS) ×3 IMPLANT
TROCAR XCEL NON-BLD 5MMX100MML (ENDOMECHANICALS) ×3 IMPLANT
WATER STERILE IRR 1000ML POUR (IV SOLUTION) ×3 IMPLANT

## 2020-06-30 NOTE — Progress Notes (Addendum)
PROGRESS NOTE    [name]  XLK:[id number] DOB: 1946-03-08 DOA: 06/29/2020 PCP: Rometta Emery, MD   Chief Complaint  Patient presents with  . Abdominal Pain    Brief Narrative:  Samantha Paul is Samantha Paul 74 y.o. female with history of stroke with right-sided hemiplegia, hypertension diabetes mellitus has been experiencing abdominal pain for the last 3 days.  Had one episode of vomiting.  Denies any fever chills or diarrhea.  Pain is mostly in the upper quadrants of the abdomen.  Since pain got worse patient came to the ER.  ED Course: In the ER patient was febrile with temperature of 100.3 F tachycardic in the 120s with lab work showing WBC count of 27.3 lactic acid of 2.6 creatinine increased from baseline of normal 2 years ago to 2.03 blood glucose 410 bicarb 16 sodium 124 lipase 16 AST ALT was normal alkaline phos 128 total bilirubin 2.3 INR 1.4 CT renal study was done which shows features concerning for acute cholecystitis and general surgery was consulted.  Lab work was showing possible DKA for which patient was started IV insulin infusion fluids.  And empiric antibiotics for possible sepsis from acute cholecystitis.  EKG shows tachycardia.  Assessment & Plan:   Principal Problem:   Sepsis (HCC) Active Problems:   Left pontine stroke (HCC)   Essential hypertension   DKA (diabetic ketoacidoses) (HCC)   ARF (acute renal failure) (HCC)  Language barrier.  Patient speaks montangnard [name] - family has been interpreting intermittently vs using vietnamese interpreter and having speaking through husband, appreciate Arlee Muslim who found after hours pager for [name] [phone]) and they're going to assist with in person interpreter on 7/6.    Addendum: Called regarding tachycardia at 1854.  developed afib with RVR post op.  Will bolus 500 cc LR and continue LR at 125/hr.  Metop 5 mg x1 with minimal improvement, will try diltiazem gtt.  Follow TSH and echo.  Hold off on  anticoagulation at this time as she's only Ritesh Opara few hours post op.    1. Sepsis  Acute cholecystitis: imaging concerning for cholecystitis.  CT abd/pelvis and RUQ [place].  CXR without acute cardiopulm disease.  Tachypneic, tachycardic, leukocytosis, febrile, meeting sepsis criteria -> sepsis physiology now improved.  1. Gen surgery c/s, recommending IVF, bowel rest, abx - plan for lap chole once medically stabilized - per med rec, she's scheduled for today at noon 2. Continue cefepime/flagyl  3. Lactic acidosis is gradually improving, continue IVF    4. Leukocytosis - WBC 27 -> 19.5  2. Diabetic ketoacidosis likely precipitated from infectious source.   1. Transitioned to levemir BID and q4 SSI 2. A1c, 12.7 3. Will need insulin at discharge - will need diabetes education  3. Acute renal failure  NAGMA likely from sepsis and poor oral intake last 3 to 4 days due to abdominal pain.  Had also 1 episode of vomiting.  NAGMA likely now related to NS resuscitation.  1. UA with 30 mg/dl protein, 0-5 RBC's 2. Continue IVF 3. No hydro on abdominal imaging   4. HOCM: discussed with cardiology, ok for surgery, resume beta blocker.  Will need cardiology follow up outpatient.   5. Anemia: component of dilution, follow iron, b12, folate, ferritin.  6. History of stroke with right-sided hemiplegia presently n.p.o.  Holding plavix and crestor for now while NPO  7. Hypertension - holding cozaar with AKI.  Holding amlodipine.  Resume metoprolol.  Labetalol prn.   8. Hyponatremia improved  9. Fall mechanical last night trying to use bathroom, no injuries  Husband has requested no students be involved in his wife's care due to Joey Lierman previous experience  DVT prophylaxis: SCD Code Status: full  Family Communication: husband at bedside Disposition:   Status is: Inpatient  Remains inpatient appropriate because:Inpatient level of care appropriate due to severity of illness   Dispo: The patient is from:  Home              Anticipated d/c is to: Home              Anticipated d/c date is: > 3 days              Patient currently is not medically stable to d/c.  Consultants:   surgery  Procedures:   none  Antimicrobials:  Anti-infectives (From admission, onward)   Start     Dose/Rate Route Frequency Ordered Stop   06/30/20 0800  ceFEPIme (MAXIPIME) 2 g in sodium chloride 0.9 % 100 mL IVPB     Discontinue     2 g 200 mL/hr over 30 Minutes Intravenous Every 24 hours 06/29/20 0553     06/29/20 1400  metroNIDAZOLE (FLAGYL) IVPB 500 mg     Discontinue     500 mg 100 mL/hr over 60 Minutes Intravenous Every 8 hours 06/29/20 0553     06/29/20 0315  ceFEPIme (MAXIPIME) 2 g in sodium chloride 0.9 % 100 mL IVPB        2 g 200 mL/hr over 30 Minutes Intravenous  Once 06/29/20 0301 06/29/20 0603   06/29/20 0315  metroNIDAZOLE (FLAGYL) IVPB 500 mg        500 mg 100 mL/hr over 60 Minutes Intravenous  Once 06/29/20 0301 06/29/20 0620      Subjective: [place] interpreter - used to speak to husband and husband spoke to wife Abdominal pain continued Fall last night, no injuries  Objective: Vitals:   06/30/20 0500 06/30/20 0600 06/30/20 0700 06/30/20 0747  BP: 103/60   (!) 120/55  Pulse: (!) 105 98 81 87  Resp: (!) 27 18 18 18   Temp: 98.7 F (37.1 C)   98.4 F (36.9 C)  TempSrc: Oral   Oral  SpO2: 98%   98%  Weight:      Height:        Intake/Output Summary (Last 24 hours) at 06/30/2020 0844 Last data filed at 06/30/2020 0758 Gross per 24 hour  Intake 2886.87 ml  Output 900 ml  Net 1986.87 ml   Filed Weights   06/29/20 0055  Weight: 81.6 kg    Examination:  General: No acute distress. Cardiovascular: Heart sounds show Kyrell Ruacho regular rate, and rhythm.  Lungs: Clear to auscultation bilaterally  Abdomen: Soft, nontender, mildly distended Neurological: Alert and oriented 3. Moves all extremities 4. Cranial nerves II through XII grossly intact. Skin: Warm and dry. No rashes or  lesions. Extremities: No clubbing or cyanosis. No edema  Data Reviewed: I have personally reviewed following labs and imaging studies  CBC: Recent Labs  Lab 06/29/20 0105 06/30/20 0229  WBC 27.3* 19.5*  NEUTROABS  --  16.7*  HGB 13.3 10.8*  HCT 45.2 35.8*  MCV 74.5* 72.8*  PLT 204 136*    Basic Metabolic Panel: Recent Labs  Lab 06/29/20 0105 06/29/20 0734 06/29/20 1306 06/29/20 2036 06/30/20 0229  NA 124* 125* 131* 131* 131*  K 4.8 4.5 3.7 3.1* 4.1  CL 91* 94* 98 101 100  CO2 16* 15* 19*  19* 19*  GLUCOSE 410* 393* 146* 136* 160*  BUN 38* 42* 35* 26* 22  CREATININE 2.03* 2.21* 1.73* 1.24* 1.19*  CALCIUM 8.9 8.2* 8.3* 8.0* 8.1*  MG  --   --   --   --  1.5*  PHOS  --   --   --   --  2.3*    GFR: Estimated Creatinine Clearance: 39.2 mL/min (Leeland Lovelady) (by C-G formula based on SCr of 1.19 mg/dL (H)).  Liver Function Tests: Recent Labs  Lab 06/29/20 0105 06/30/20 0229  AST 26 23  ALT 23 17  ALKPHOS 128* 100  BILITOT 2.3* 1.3*  PROT 7.1 5.6*  ALBUMIN 2.9* 2.0*    CBG: Recent Labs  Lab 06/29/20 2103 06/29/20 2150 06/29/20 2306 06/30/20 0424 06/30/20 0827  GLUCAP 133* 129* 125* 181* 186*     Recent Results (from the past 240 hour(s))  Blood culture (routine x 2)     Status: None (Preliminary result)   Collection Time: 06/29/20  3:50 AM   Specimen: BLOOD RIGHT ARM  Result Value Ref Range Status   Specimen Description BLOOD RIGHT ARM  Final   Special Requests   Final    BOTTLES DRAWN AEROBIC AND ANAEROBIC Blood Culture results may not be optimal due to an inadequate volume of blood received in culture bottles   Culture   Final    NO GROWTH < 12 HOURS Performed at [facility], [address] [place], [place] [address]    Report Status PENDING  Incomplete  SARS Coronavirus 2 by RT PCR (hospital order, performed in [facility] hospital lab) Nasopharyngeal Nasopharyngeal Swab     Status: None   Collection Time: 06/29/20  5:00 AM   Specimen:  Nasopharyngeal Swab  Result Value Ref Range Status   SARS Coronavirus 2 NEGATIVE NEGATIVE Final    Comment: (NOTE) SARS-CoV-2 target nucleic acids are NOT DETECTED.  The SARS-CoV-2 RNA is generally detectable in upper and lower respiratory specimens during the acute phase of infection. The lowest concentration of SARS-CoV-2 viral copies this assay can detect is 250 copies / mL. Chrsitopher Wik negative result does not preclude SARS-CoV-2 infection and should not be used as the sole basis for treatment or other patient management decisions.  Thuy Atilano negative result may occur with improper specimen collection / handling, submission of specimen other than nasopharyngeal swab, presence of viral mutation(s) within the areas targeted by this assay, and inadequate number of viral copies (<250 copies / mL). Neyra Pettie negative result must be combined with clinical observations, patient history, and epidemiological information.  Fact Sheet for Patients:   BoilerBrush.com.cy  Fact Sheet for Healthcare Providers: https://pope.com/  This test is not yet approved or  cleared by the [place] FDA and has been authorized for detection and/or diagnosis of SARS-CoV-2 by FDA under an Emergency Use Authorization (EUA).  This EUA will remain in effect (meaning this test can be used) for the duration of the COVID-19 declaration under Section 564(b)(1) of the Act, 21 U.S.C. section 360bbb-3(b)(1), unless the authorization is terminated or revoked sooner.  Performed at [facility], [address], [place], [place] [address]          Radiology Studies: DG Chest 1 View  Result Date: 06/29/2020 CLINICAL DATA:  74 year old female with history of sepsis. EXAM: CHEST  1 VIEW COMPARISON:  Chest x-ray 07/10/2018. FINDINGS: Mild elevation of the right hemidiaphragm. Lung volumes are normal. No consolidative airspace disease. No pleural effusions. No pneumothorax. No evidence  of pulmonary edema. Mild cardiomegaly, similar to the prior examination. Upper mediastinal contours are within normal limits. IMPRESSION: 1. No radiographic evidence of acute cardiopulmonary disease. 2. Mild cardiomegaly. 3. Mild elevation of the right hemidiaphragm. Electronically Signed   By: Trudie Reed M.D.   On: 06/29/2020 04:46   US Abdomen Limited  Result Date: 06/29/2020 CLINICAL DATA:  74 year old female with severe abdominal pain for the past 3 days. Possible cholecystitis and noted on recent CT examination. EXAM: ULTRASOUND ABDOMEN LIMITED RIGHT UPPER QUADRANT COMPARISON:  No prior abdominal ultrasound. CT the abdomen and pelvis 06/29/2020. FINDINGS: Gallbladder: Large echogenic structure with posterior acoustic shadowing measuring 4.2 cm in diameter in the neck of the gallbladder. Gallbladder is moderately distended and there are low-level internal echoes suggesting biliary sludge. Gallbladder wall appears thickened and edematous measuring 8 mm. Trace amount of pericholecystic fluid. Per report from the sonographer, the patient did not exhibit Deena Shaub sonographic Murphy's sign on examination. Common bile duct: Diameter: 4 mm Liver: No focal lesion identified. Within normal limits in parenchymal echogenicity. Portal vein is patent on color Doppler imaging with normal direction of blood flow towards the liver. Other: None. IMPRESSION: 1. Study is positive for cholelithiasis and biliary sludge with Sreenidhi Ganson distended gallbladder with thickened edematous wall. Per report from the sonographer, the patient did not exhibit Jahleel Stroschein sonographic Murphy's sign on examination, which could be related to recent administration of pain medication. Otherwise, imaging findings are highly concerning for acute cholecystitis. Surgical consultation is recommended. Electronically Signed   By: Trudie Reed M.D.   On: 06/29/2020 06:36   CT Renal Stone Study  Result Date: 06/29/2020 CLINICAL DATA:  74 year old female with history of  elevated white blood cell count. Bilateral flank pain. Nausea and vomiting. EXAM: CT ABDOMEN AND PELVIS WITHOUT CONTRAST TECHNIQUE: Multidetector CT imaging of the abdomen and pelvis was performed following the standard protocol without IV contrast. COMPARISON:  No priors. FINDINGS: Lower chest: Calcifications of the mitral annulus. Hepatobiliary: No definite cystic or solid hepatic lesions are confidently identified on today's noncontrast CT examination. Large calcified gallstone measuring approximately 2.8 x 4.5 x 2.2 cm located in the gallbladder neck. Gallbladder appears moderately distended, with gallbladder wall thickening and haziness and trace volume of fluid adjacent to the gallbladder, concerning for acute cholecystitis. Pancreas: No definite pancreatic mass or peripancreatic fluid collections or inflammatory changes are noted on today's noncontrast CT examination. Spleen: Unremarkable. Adrenals/Urinary Tract: Low-attenuation lesions in the right kidney, incompletely characterized on today's non-contrast CT examination, but statistically likely to represent cysts, measuring up to 5.1 cm in the upper pole. Unenhanced appearance of the left kidney and bilateral adrenal glands is normal. No hydroureteronephrosis. Urinary bladder is normal in appearance. Stomach/Bowel: Unenhanced appearance of the stomach is normal. There is no pathologic dilatation of small bowel or colon. Normal appendix. Vascular/Lymphatic: Aortic atherosclerosis. No lymphadenopathy noted in the abdomen or pelvis. Reproductive: Uterus and ovaries are atrophic. Other: No significant volume of ascites.  No pneumoperitoneum. Musculoskeletal: There are no aggressive appearing lytic or blastic lesions noted in the visualized portions of the skeleton. IMPRESSION: 1. Findings are highly concerning for acute cholecystitis, as detailed above. Surgical consultation is recommended. 2. Aortic atherosclerosis. Electronically Signed   By: Trudie Reed M.D.   On: 06/29/2020 04:41        Scheduled Meds: . insulin aspart  0-6 Units Subcutaneous Q4H  . insulin detemir  6 Units Subcutaneous BID  . phosphorus  500 mg Oral Once   Continuous Infusions: .  ceFEPime (MAXIPIME) IV 2 g (06/30/20 0825)  . insulin Stopped (06/29/20 2150)  . lactated ringers 125 mL/hr at 06/30/20 0834  . magnesium sulfate bolus IVPB 2 g (06/30/20 0842)  . metronidazole 100 mL/hr at 06/30/20 0700     LOS: 1 day    Time spent: over 30 min    Lacretia Nicks, MD [facility]   To contact the attending provider between 7A-7P or the covering provider during after hours 7P-7A, please log into the web site [email or web address] and access using universal [place] password for that web site. If you do not have the password, please call the hospital operator.  06/30/2020, 8:44 AM

## 2020-06-30 NOTE — Transfer of Care (Signed)
Immediate Anesthesia Transfer of Care Note  Patient: Samantha Paul  Procedure(s) Performed: LAPAROSCOPIC CHOLECYSTECTOMY (N/A Abdomen)  Patient Location: PACU  Anesthesia Type:General  Level of Consciousness: awake and alert   Airway & Oxygen Therapy: Patient Spontanous Breathing and Patient connected to face mask oxygen  Post-op Assessment: Report given to RN and Post -op Vital signs reviewed and stable  Post vital signs: Reviewed, stable and unstable  Last Vitals:  Vitals Value Taken Time  BP 123/60 06/30/20 1333  Temp    Pulse 82 06/30/20 1335  Resp 26 06/30/20 1335  SpO2 100 % 06/30/20 1335  Vitals shown include unvalidated device data.  Last Pain:  Vitals:   06/30/20 1000  TempSrc:   PainSc: 0-No pain      Patients Stated Pain Goal: 0 (06/30/20 0500)  Complications: No complications documented.

## 2020-06-30 NOTE — Op Note (Signed)
06/30/2020  1:30 PM  PATIENT:  Samantha Paul  74 y.o. female  PRE-OPERATIVE DIAGNOSIS:  cholecystitis  POST-OPERATIVE DIAGNOSIS: Gangrenous cholecystitis  PROCEDURE:  Procedure(s): LAPAROSCOPIC CHOLECYSTECTOMY  SURGEON:  Surgeon(s): [name], MD  ASSISTANTS: none  ANESTHESIA:   local and general  EBL:  Total I/O In: 800 [I.V.:800] Out: 310 [Urine:300; Blood:10]  BLOOD ADMINISTERED:none  DRAINS: (1) Jackson-Pratt drain(s) with closed bulb suction in the ruq   SPECIMEN:  Excision  DISPOSITION OF SPECIMEN:  PATHOLOGY  COUNTS:  YES  DICTATION: .Dragon Dictation Findings: Severe gangrenous cholecystitis, very short and small cystic duct  Procedure in detail: Informed consent was obtained.  She is receiving IV antibiotics.  She was brought to the operating room and general endotracheal anesthesia was administered by the anesthesia staff.  Her abdomen was prepped and draped in sterile fashion.  We did a timeout procedure.The infraumbilical region was infiltrated with local. Infraumbilical incision was made. Subcutaneous tissues were dissected down revealing the anterior fascia. This was divided sharply along the midline. Peritoneal cavity was entered under direct vision without complication. A 0 Vicryl pursestring was placed around the fascial opening. Hassan trocar was inserted into the abdomen. The abdomen was insufflated with carbon dioxide in standard fashion. Under direct vision a 5 mm epigastric and 5 mm right abdominal port x2 were placed.  Local was used at each port site.  Laparoscopic exploration revealed a very inflamed gallbladder encased in omentum.  There were some adhesions up to the anterior abdominal wall which were taken down carefully.  The omentum was then gently dissected free off of the gallbladder revealing several areas of patchy gangrenous necrosis along the gallbladder.  The gallbladder was drained with the Nahzat drainage catheter.  The dome was then  retracted superior medially.  The infundibulum was retracted inferior laterally.  Further adhesions were swept off revealing the infundibulum.  Dissection began laterally and progressed medially very carefully.  There was a lot of necrosis of the infundibulum and a large stone within the gallbladder.  The cystic artery was encountered first.  This was clipped twice proximally and divided distally with cautery.  Further dissection revealed the cystic duct to be quite short and narrow.  I was not able to do a cholangiogram.  I was able to clearly see the cystic duct and the cystic duct junction with the common bile duct.  I stayed above the common bile duct.  3 clips were placed proximally on the cystic duct and one was placed distally on the cystic duct and it was divided.  The gallbladder was taken off the liver bed using cautery.  There was a lot of necrosis of the posterior wall.  The gallbladder was placed in a bag along with a large stone.  The bag was removed from the abdomen and the gallbladder was sent to pathology.  Liver bed was copiously irrigated and cauterized to get excellent hemostasis.  I also placed a piece of Surgicel snow and achieved good hemostasis.  I placed a 56 [place] Blake drain up in the right upper quadrant.  This was secured to the skin with nylon.  The liver bed appeared dry.  Clips remain in good position.  Pneumoperitoneum was released.  Infraumbilical fascia was closed by tying the pursestring.  I also put an additional 0 Vicryl figure-of-eight suture to reinforce the closure.  All 3 remaining wounds were irrigated and the skin of each was closed with 4-0 Vicryl followed by Dermabond.  All counts were correct.  She tolerated the procedure well without apparent complication was taken recovery in stable condition.  PATIENT DISPOSITION:  PACU - hemodynamically stable.   Delay start of Pharmacological VTE agent (>24hrs) due to surgical blood loss or risk of bleeding:  no  [name], MD, MPH, FACS Pager: [phone]  7/6/20211:30 PM

## 2020-06-30 NOTE — Progress Notes (Signed)
Subjective/Chief Complaint: Less RUQ pain   Objective: Vital signs in last 24 hours: Temp:  [98.4 F (36.9 C)-101 F (38.3 C)] 98.4 F (36.9 C) (07/06 0747) Pulse Rate:  [76-121] 76 (07/06 1000) Resp:  [16-38] 21 (07/06 1000) BP: (97-158)/(49-97) 124/58 (07/06 1000) SpO2:  [94 %-99 %] 98 % (07/06 0747) Last BM Date: 06/29/20  Intake/Output from previous day: 07/05 0701 - 07/06 0700 In: 2886.9 [I.V.:2497.9; IV Piggyback:389] Out: 600 [Urine:500; Stool:100] Intake/Output this shift: Total I/O In: -  Out: 300 [Urine:300]  General appearance: cooperative Resp: clear to auscultation bilaterally Cardio: regular rate and rhythm GI: soft, tender RUQ  Lab Results:  Recent Labs    06/29/20 0105 06/30/20 0229  WBC 27.3* 19.5*  HGB 13.3 10.8*  HCT 45.2 35.8*  PLT 204 136*   BMET Recent Labs    06/29/20 2036 06/30/20 0229  NA 131* 131*  K 3.1* 4.1  CL 101 100  CO2 19* 19*  GLUCOSE 136* 160*  BUN 26* 22  CREATININE 1.24* 1.19*  CALCIUM 8.0* 8.1*   PT/INR Recent Labs    06/29/20 0318  LABPROT 16.3*  INR 1.4*   ABG No results for input(s): PHART, HCO3 in the last 72 hours.  Invalid input(s): PCO2, PO2  Studies/Results: DG Chest 1 View  Result Date: 06/29/2020 CLINICAL DATA:  74 year old female with history of sepsis. EXAM: CHEST  1 VIEW COMPARISON:  Chest x-ray 07/10/2018. FINDINGS: Mild elevation of the right hemidiaphragm. Lung volumes are normal. No consolidative airspace disease. No pleural effusions. No pneumothorax. No evidence of pulmonary edema. Mild cardiomegaly, similar to the prior examination. Upper mediastinal contours are within normal limits. IMPRESSION: 1. No radiographic evidence of acute cardiopulmonary disease. 2. Mild cardiomegaly. 3. Mild elevation of the right hemidiaphragm. Electronically Signed   By: Trudie Reed M.D.   On: 06/29/2020 04:46   US Abdomen Limited  Result Date: 06/29/2020 CLINICAL DATA:  74 year old female with  severe abdominal pain for the past 3 days. Possible cholecystitis and noted on recent CT examination. EXAM: ULTRASOUND ABDOMEN LIMITED RIGHT UPPER QUADRANT COMPARISON:  No prior abdominal ultrasound. CT the abdomen and pelvis 06/29/2020. FINDINGS: Gallbladder: Large echogenic structure with posterior acoustic shadowing measuring 4.2 cm in diameter in the neck of the gallbladder. Gallbladder is moderately distended and there are low-level internal echoes suggesting biliary sludge. Gallbladder wall appears thickened and edematous measuring 8 mm. Trace amount of pericholecystic fluid. Per report from the sonographer, the patient did not exhibit a sonographic Murphy's sign on examination. Common bile duct: Diameter: 4 mm Liver: No focal lesion identified. Within normal limits in parenchymal echogenicity. Portal vein is patent on color Doppler imaging with normal direction of blood flow towards the liver. Other: None. IMPRESSION: 1. Study is positive for cholelithiasis and biliary sludge with a distended gallbladder with thickened edematous wall. Per report from the sonographer, the patient did not exhibit a sonographic Murphy's sign on examination, which could be related to recent administration of pain medication. Otherwise, imaging findings are highly concerning for acute cholecystitis. Surgical consultation is recommended. Electronically Signed   By: Trudie Reed M.D.   On: 06/29/2020 06:36   CT Renal Stone Study  Result Date: 06/29/2020 CLINICAL DATA:  74 year old female with history of elevated white blood cell count. Bilateral flank pain. Nausea and vomiting. EXAM: CT ABDOMEN AND PELVIS WITHOUT CONTRAST TECHNIQUE: Multidetector CT imaging of the abdomen and pelvis was performed following the standard protocol without IV contrast. COMPARISON:  No priors. FINDINGS: Lower chest:  Calcifications of the mitral annulus. Hepatobiliary: No definite cystic or solid hepatic lesions are confidently identified on  today's noncontrast CT examination. Large calcified gallstone measuring approximately 2.8 x 4.5 x 2.2 cm located in the gallbladder neck. Gallbladder appears moderately distended, with gallbladder wall thickening and haziness and trace volume of fluid adjacent to the gallbladder, concerning for acute cholecystitis. Pancreas: No definite pancreatic mass or peripancreatic fluid collections or inflammatory changes are noted on today's noncontrast CT examination. Spleen: Unremarkable. Adrenals/Urinary Tract: Low-attenuation lesions in the right kidney, incompletely characterized on today's non-contrast CT examination, but statistically likely to represent cysts, measuring up to 5.1 cm in the upper pole. Unenhanced appearance of the left kidney and bilateral adrenal glands is normal. No hydroureteronephrosis. Urinary bladder is normal in appearance. Stomach/Bowel: Unenhanced appearance of the stomach is normal. There is no pathologic dilatation of small bowel or colon. Normal appendix. Vascular/Lymphatic: Aortic atherosclerosis. No lymphadenopathy noted in the abdomen or pelvis. Reproductive: Uterus and ovaries are atrophic. Other: No significant volume of ascites.  No pneumoperitoneum. Musculoskeletal: There are no aggressive appearing lytic or blastic lesions noted in the visualized portions of the skeleton. IMPRESSION: 1. Findings are highly concerning for acute cholecystitis, as detailed above. Surgical consultation is recommended. 2. Aortic atherosclerosis. Electronically Signed   By: Trudie Reed M.D.   On: 06/29/2020 04:41    Anti-infectives: Anti-infectives (From admission, onward)   Start     Dose/Rate Route Frequency Ordered Stop   06/30/20 0800  ceFEPIme (MAXIPIME) 2 g in sodium chloride 0.9 % 100 mL IVPB     Discontinue     2 g 200 mL/hr over 30 Minutes Intravenous Every 24 hours 06/29/20 0553     06/29/20 1400  metroNIDAZOLE (FLAGYL) IVPB 500 mg     Discontinue     500 mg 100 mL/hr over 60  Minutes Intravenous Every 8 hours 06/29/20 0553     06/29/20 0315  ceFEPIme (MAXIPIME) 2 g in sodium chloride 0.9 % 100 mL IVPB        2 g 200 mL/hr over 30 Minutes Intravenous  Once 06/29/20 0301 06/29/20 0603   06/29/20 0315  metroNIDAZOLE (FLAGYL) IVPB 500 mg        500 mg 100 mL/hr over 60 Minutes Intravenous  Once 06/29/20 0301 06/29/20 0620      Assessment/Plan: Cholecystitis, possible choledocholithiasis - AKI and LFTs have improved. CBG better. Will proceed with laparoscopic cholecystectomy, cholangiogram. I discussed the procedure, risks, and benefits with her and her husband. We also called two of her sons on speaker phone. They all agree. Consent done.   LOS: 1 day    Liz Malady 06/30/2020

## 2020-06-30 NOTE — Progress Notes (Signed)
Pt received from PACU. VSS. Stomach incisions clean, dry and intact. JP bulb charged w/ scant serosanguineous drainage. Will continue to monitor.  Versie Starks, RN

## 2020-06-30 NOTE — Progress Notes (Signed)
Inpatient Diabetes Program Recommendations  AACE/ADA: New Consensus Statement on Inpatient Glycemic Control (2015)  Target Ranges:  Prepandial:   less than 140 mg/dL      Peak postprandial:   less than 180 mg/dL (1-2 hours)      Critically ill patients:  140 - 180 mg/dL   Lab Results  Component Value Date   GLUCAP 186 (H) 06/30/2020   HGBA1C 12.7 (H) 06/29/2020    Review of Glycemic Control Results for RAIVYN, KABLER (MRN [id number]) as of 06/30/2020 11:03  Ref. Range 06/29/2020 21:03 06/29/2020 21:50 06/29/2020 23:06 06/30/2020 04:24 06/30/2020 08:27  Glucose-Capillary Latest Ref Range: 70 - 99 mg/dL 007 (H) 121 (H) 975 (H) 181 (H) 186 (H)   Diabetes history: DM 2 Outpatient Diabetes medications: Amaryl 1 mg Daily Current orders for Inpatient glycemic control:  Levemir 6 units bid Novolog 0-6 units Q4 hours  Inpatient Diabetes Program Recommendations:    Will schedule DM education for tomorrow morning, 7/7 at 10 am with interpreter.  Follow glucose trends on current regimen second dose of Levemir given this am.   Thanks,  Christena Deem RN, MSN, BC-ADM Inpatient Diabetes Coordinator Team Pager [phone] (8a-5p)

## 2020-06-30 NOTE — Progress Notes (Signed)
Monitor and 12 LED pt converted to AFIB HR sustaining 150-170 . BP 114/87(93) Spo2 96 RA. RR21. Dr. Lowell Guitar notified.New orders placed in [facility]. Charge Nurse notified.   Will continue to monitor    Everlean Cherry, RN

## 2020-06-30 NOTE — Anesthesia Preprocedure Evaluation (Addendum)
Anesthesia Evaluation  Patient identified by MRN, date of birth, ID band Patient awake    Reviewed: Allergy & Precautions, NPO status , Patient's Chart, lab work & pertinent test results  Airway Mallampati: III  TM Distance: >3 FB Neck ROM: Full    Dental  (+) Loose,    Pulmonary neg pulmonary ROS,    Pulmonary exam normal breath sounds clear to auscultation       Cardiovascular hypertension, Pt. on medications and Pt. on home beta blockers Normal cardiovascular exam Rhythm:Regular Rate:Normal     Neuro/Psych CVA, Residual Symptoms negative psych ROS   GI/Hepatic negative GI ROS, Neg liver ROS,   Endo/Other  diabetes, Oral Hypoglycemic Agents  Renal/GU negative Renal ROS     Musculoskeletal Back pain Knee pain   Abdominal (+) + obese,   Peds  Hematology  (+) anemia , HLD   Anesthesia Other Findings cholecystitis  Reproductive/Obstetrics                           Anesthesia Physical Anesthesia Plan  ASA: III  Anesthesia Plan: General   Post-op Pain Management:    Induction: Intravenous  PONV Risk Score and Plan: 3 and Ondansetron, Dexamethasone, Midazolam and Treatment may vary due to age or medical condition  Airway Management Planned: Oral ETT  Additional Equipment:   Intra-op Plan:   Post-operative Plan: Extubation in OR  Informed Consent: I have reviewed the patients History and Physical, chart, labs and discussed the procedure including the risks, benefits and alternatives for the proposed anesthesia with the patient or authorized representative who has indicated his/her understanding and acceptance.     Interpreter used for [employer] and Sales promotion account executive given  Plan Discussed with: CRNA  Anesthesia Plan Comments:        Anesthesia Quick Evaluation

## 2020-06-30 NOTE — Anesthesia Postprocedure Evaluation (Signed)
Anesthesia Post Note  Patient: Samantha Paul  Procedure(s) Performed: LAPAROSCOPIC CHOLECYSTECTOMY (N/A Abdomen)     Patient location during evaluation: PACU Anesthesia Type: General Level of consciousness: sedated and patient cooperative Pain management: pain level controlled Vital Signs Assessment: post-procedure vital signs reviewed and stable Respiratory status: spontaneous breathing Cardiovascular status: stable Anesthetic complications: no   No complications documented.  Last Vitals:  Vitals:   06/30/20 1520 06/30/20 1600  BP: 137/68 (!) 143/63  Pulse: 80 78  Resp: 20 (!) 21  Temp: 36.7 C 37 C  SpO2: 97%     Last Pain:  Vitals:   06/30/20 1600  TempSrc: Oral  PainSc:                  Lewie Loron

## 2020-06-30 NOTE — Anesthesia Procedure Notes (Signed)
Procedure Name: Intubation Date/Time: 06/30/2020 12:02 PM Performed by: Malachi Paradise, RN Pre-anesthesia Checklist: Patient identified, Emergency Drugs available, Suction available and Patient being monitored Patient Re-evaluated:Patient Re-evaluated prior to induction Oxygen Delivery Method: Circle system utilized Preoxygenation: Pre-oxygenation with 100% oxygen Induction Type: IV induction Ventilation: Mask ventilation without difficulty Laryngoscope Size: Mac and 3 Grade View: Grade I Tube type: Oral Tube size: 7.0 mm Number of attempts: 1 Airway Equipment and Method: Stylet and Oral airway Placement Confirmation: ETT inserted through vocal cords under direct vision,  positive ETCO2 and breath sounds checked- equal and bilateral Secured at: 22 cm Tube secured with: Tape Dental Injury: Teeth and Oropharynx as per pre-operative assessment

## 2020-06-30 NOTE — Progress Notes (Signed)
06/30/20 0241  What Happened  Was fall witnessed? Yes  Who witnessed fall? Pt's husband who was at bedside  Patients activity before fall ambulating-assisted;bathroom-assisted;to/from bed, chair, or stretcher  Point of contact buttocks  Was patient injured? No  Follow Up  MD notified T. Opyd of [facility]  Time MD notified 0300  Family notified Yes - comment (Husband at bedside)  Time family notified 0237  Additional tests No  Progress note created (see row info) Yes  Adult Fall Risk Assessment  Risk Factor Category (scoring not indicated) Fall has occurred during this admission (document High fall risk)  Age 74  Fall History: Fall within 6 months prior to admission 0  Elimination; Bowel and/or Urine Incontinence 0  Elimination; Bowel and/or Urine Urgency/Frequency 0  Medications: includes PCA/Opiates, Anti-convulsants, Anti-hypertensives, Diuretics, Hypnotics, Laxatives, Sedatives, and Psychotropics 5  Patient Care Equipment 2  Mobility-Assistance 2  Mobility-Gait 2  Mobility-Sensory Deficit 0  Altered awareness of immediate physical environment 0  Impulsiveness 0  Lack of understanding of one's physical/cognitive limitations 0  Total Score 13  Patient Fall Risk Level High fall risk  Adult Fall Risk Interventions  Required Bundle Interventions *See Row Information* High fall risk - low, moderate, and high requirements implemented  Additional Interventions Family Supervision  Screening for Fall Injury Risk (To be completed on HIGH fall risk patients) - Assessing Need for Low Bed  Risk For Fall Injury- Low Bed Criteria None identified - Continue screening  Screening for Fall Injury Risk (To be completed on HIGH fall risk patients who do not meet crieteria for Low Bed) - Assessing Need for Floor Mats Only  Risk For Fall Injury- Criteria for Floor Mats None identified - No additional interventions needed  Vitals  Temp 99.8 F (37.7 C)  Temp Source Oral  BP (!) 148/97  MAP (mmHg)  112  BP Location Right Arm  BP Method Automatic  Patient Position (if appropriate) Lying  Pulse Rate (!) 121  Pulse Rate Source Monitor  ECG Heart Rate (!) 120  Cardiac Rhythm ST  Resp (!) 21  Oxygen Therapy  SpO2 97 %  O2 Device Room Air  Pulse Oximetry Type Continuous  Pain Assessment  Pain Scale 0-10  Pain Score 0  PCA/Epidural/Spinal Assessment  Respiratory Pattern Regular;Unlabored;Symmetrical;No dyspnea  Neurological  Level of Consciousness Alert  Orientation Level Oriented X4 (per family)  Cognition Appropriate at baseline  Speech Other (Comment) (UTA 2/2 language barrier)  Pupil Assessment  No  Glasgow Coma Scale  Eye Opening 4  Best Verbal Response (NON-intubated) 5  Best Motor Response 6  Glasgow Coma Scale Score 15  Musculoskeletal  Musculoskeletal (WDL) X  Assistive Device Front wheel walker  Generalized Weakness Yes  Weight Bearing Restrictions No  Integumentary  Integumentary (WDL) WDL   @approx . 0237, pt had witnessed and assisted fall by husband while he was aiding her to [facility] as he had previously done several times throughout the day. [facility], RN, who noted pt's HR increase on telemetry went to room to find pt sitting on floor. Pt's RN, Sherrilyn Rist arrived at room soon after and pt was assisted to [facility]. Through husband's interpreting (no [place] interpreter available), pt denied injury or new pain. VS assessed and stable. Post fall algorithm initiated per protocol and MD notified. Will obtain frequent vitals and continue to monitor. Pt educated on need to always ask for assistance via her call-bell. Through husband's interpreting pt endorsed understanding. Bed alarm set and fall risk bracelet applied.

## 2020-07-01 ENCOUNTER — Inpatient Hospital Stay (HOSPITAL_COMMUNITY): Payer: Medicare Other

## 2020-07-01 ENCOUNTER — Encounter (HOSPITAL_COMMUNITY): Payer: Self-pay | Admitting: General Surgery

## 2020-07-01 LAB — CBC WITH DIFFERENTIAL/PLATELET
Abs Immature Granulocytes: 0.08 10*3/uL — ABNORMAL HIGH (ref 0.00–0.07)
Basophils Absolute: 0 10*3/uL (ref 0.0–0.1)
Basophils Relative: 0 %
Eosinophils Absolute: 0 10*3/uL (ref 0.0–0.5)
Eosinophils Relative: 0 %
HCT: 33.1 % — ABNORMAL LOW (ref 36.0–46.0)
Hemoglobin: 10 g/dL — ABNORMAL LOW (ref 12.0–15.0)
Immature Granulocytes: 1 %
Lymphocytes Relative: 5 %
Lymphs Abs: 0.7 10*3/uL (ref 0.7–4.0)
MCH: 21.6 pg — ABNORMAL LOW (ref 26.0–34.0)
MCHC: 30.2 g/dL (ref 30.0–36.0)
MCV: 71.5 fL — ABNORMAL LOW (ref 80.0–100.0)
Monocytes Absolute: 0.9 10*3/uL (ref 0.1–1.0)
Monocytes Relative: 6 %
Neutro Abs: 12.3 10*3/uL — ABNORMAL HIGH (ref 1.7–7.7)
Neutrophils Relative %: 88 %
Platelets: 165 10*3/uL (ref 150–400)
RBC: 4.63 MIL/uL (ref 3.87–5.11)
RDW: 13.8 % (ref 11.5–15.5)
WBC: 13.9 10*3/uL — ABNORMAL HIGH (ref 4.0–10.5)
nRBC: 0 % (ref 0.0–0.2)

## 2020-07-01 LAB — GLUCOSE, CAPILLARY
Glucose-Capillary: 181 mg/dL — ABNORMAL HIGH (ref 70–99)
Glucose-Capillary: 235 mg/dL — ABNORMAL HIGH (ref 70–99)
Glucose-Capillary: 257 mg/dL — ABNORMAL HIGH (ref 70–99)
Glucose-Capillary: 269 mg/dL — ABNORMAL HIGH (ref 70–99)
Glucose-Capillary: 272 mg/dL — ABNORMAL HIGH (ref 70–99)
Glucose-Capillary: 278 mg/dL — ABNORMAL HIGH (ref 70–99)
Glucose-Capillary: 325 mg/dL — ABNORMAL HIGH (ref 70–99)

## 2020-07-01 LAB — HEPARIN LEVEL (UNFRACTIONATED): Heparin Unfractionated: <0.1 IU/mL — ABNORMAL LOW (ref 0.30–0.70)

## 2020-07-01 LAB — COMPREHENSIVE METABOLIC PANEL
ALT: 26 U/L (ref 0–44)
AST: 43 U/L — ABNORMAL HIGH (ref 15–41)
Albumin: 1.9 g/dL — ABNORMAL LOW (ref 3.5–5.0)
Alkaline Phosphatase: 112 U/L (ref 38–126)
Anion gap: 11 (ref 5–15)
BUN: 26 mg/dL — ABNORMAL HIGH (ref 8–23)
CO2: 17 mmol/L — ABNORMAL LOW (ref 22–32)
Calcium: 7.9 mg/dL — ABNORMAL LOW (ref 8.9–10.3)
Chloride: 104 mmol/L (ref 98–111)
Creatinine, Ser: 1.02 mg/dL — ABNORMAL HIGH (ref 0.44–1.00)
GFR calc Af Amer: >60 mL/min (ref >60)
GFR calc non Af Amer: 55 mL/min — ABNORMAL LOW (ref >60)
Glucose, Bld: 188 mg/dL — ABNORMAL HIGH (ref 70–99)
Potassium: 4.1 mmol/L (ref 3.5–5.1)
Sodium: 132 mmol/L — ABNORMAL LOW (ref 135–145)
Total Bilirubin: 0.5 mg/dL (ref 0.3–1.2)
Total Protein: 5.3 g/dL — ABNORMAL LOW (ref 6.5–8.1)

## 2020-07-01 LAB — URINE CULTURE: Culture: NO GROWTH

## 2020-07-01 LAB — PHOSPHORUS: Phosphorus: 3.9 mg/dL (ref 2.5–4.6)

## 2020-07-01 LAB — MAGNESIUM: Magnesium: 2.1 mg/dL (ref 1.7–2.4)

## 2020-07-01 LAB — TSH: TSH: 0.278 u[IU]/mL — ABNORMAL LOW (ref 0.350–4.500)

## 2020-07-01 LAB — SURGICAL PATHOLOGY

## 2020-07-01 MED ORDER — SODIUM CHLORIDE 0.9 % IV SOLN
2.0000 g | INTRAVENOUS | Status: DC
  Administered 2020-07-01 – 2020-07-02 (×2): 2 g via INTRAVENOUS
  Filled 2020-07-01 (×3): qty 20

## 2020-07-01 MED ORDER — SODIUM CHLORIDE 0.9 % IV SOLN
2.0000 g | Freq: Two times a day (BID) | INTRAVENOUS | Status: DC
  Filled 2020-07-01: qty 2

## 2020-07-01 MED ORDER — DILTIAZEM HCL 60 MG PO TABS
60.0000 mg | ORAL_TABLET | Freq: Four times a day (QID) | ORAL | Status: DC
  Administered 2020-07-01 – 2020-07-03 (×7): 60 mg via ORAL
  Filled 2020-07-01 (×7): qty 1

## 2020-07-01 MED ORDER — HEPARIN (PORCINE) 25000 UT/250ML-% IV SOLN
1100.0000 [IU]/h | INTRAVENOUS | Status: AC
  Administered 2020-07-01: 900 [IU]/h via INTRAVENOUS
  Administered 2020-07-02: 1100 [IU]/h via INTRAVENOUS
  Filled 2020-07-01 (×2): qty 250

## 2020-07-01 MED ORDER — ACETAMINOPHEN 325 MG PO TABS
650.0000 mg | ORAL_TABLET | ORAL | Status: DC
  Administered 2020-07-01 – 2020-07-03 (×11): 650 mg via ORAL
  Filled 2020-07-01 (×11): qty 2

## 2020-07-01 NOTE — Progress Notes (Signed)
ANTICOAGULATION CONSULT NOTE- Follow up Pharmacy Consult for Heparin Indication: atrial fibrillation  Allergies  Allergen Reactions  . Metformin And Related     Abdominal pain and diarrhea    Patient Measurements: Height: 4\' 10"  (147.3 cm) Weight: 81.6 kg (180 lb) IBW/kg (Calculated) : 40.9 Heparin Dosing Weight: 60.3  Vital Signs: Temp: 97.6 F (36.4 C) (07/07 1553) Temp Source: Oral (07/07 1553) BP: 112/59 (07/07 1553) Pulse Rate: 80 (07/07 1553)  Labs: Recent Labs    06/29/20 0105 06/29/20 0105 06/29/20 0318 06/29/20 0734 06/29/20 2036 06/30/20 0229 07/01/20 0556 07/01/20 1840  HGB 13.3   < >  --   --   --  10.8* 10.0*  --   HCT 45.2  --   --   --   --  35.8* 33.1*  --   PLT 204  --   --   --   --  136* 165  --   APTT  --   --  35  --   --   --   --   --   LABPROT  --   --  16.3*  --   --   --   --   --   INR  --   --  1.4*  --   --   --   --   --   HEPARINUNFRC  --   --   --   --   --   --   --  <0.10*  CREATININE 2.03*  --   --    < > 1.24* 1.19* 1.02*  --    < > = values in this interval not displayed.    Estimated Creatinine Clearance: 45.7 mL/min (A) (by C-G formula based on SCr of 1.02 mg/dL (H)).   Medical History: Past Medical History:  Diagnosis Date  . Back pain   . HTN (hypertension)   . Knee pain   . Stroke [facility])     Assessment: 74 y.o female with new onset Atrial fibrillation.  Started on IV heparin (no bolus per MD's order).  The first 6 hour HL is  <0.1, undectable on 900 ut/hr (no bolus per MD order). Hgb 10, pltc 165, scr 1.02.  No bleeding noted BMI is 37.6. Pharmacy has not been informed of any issues or interruptions with the heparin infusion.    Goal of Therapy:  Heparin level 0.3-0.7 units/ml Monitor platelets by anticoagulation protocol: Yes   Plan:   Increase heparin rate to 1100 units/hr  (no bolus per MD) and check 8 hr HL ~0430.  Daily HL, and CBC   62, RPh Clinical Pharmacist Please check AMION for all [facility]  Pharmacy phone numbers After 10:00 PM, call Main Pharmacy [phone] 07/01/2020,8:23 PM

## 2020-07-01 NOTE — Progress Notes (Signed)
Pharmacy Antibiotic Note  Samantha Paul is a 74 y.o. female admitted on 06/29/2020 with sepsis and cholecysitits.  Pharmacy has been consulted for cefepime dosing.  Plan: The dose of cefepime will be adjusted to 2g q12 based on renal function.  Height: 4\' 10"  (147.3 cm) Weight: 81.6 kg (180 lb) IBW/kg (Calculated) : 40.9  Temp (24hrs), Avg:98 F (36.7 C), Min:97.4 F (36.3 C), Max:99.2 F (37.3 C)  Recent Labs  Lab 06/29/20 0105 06/29/20 0105 06/29/20 0315 06/29/20 0734 06/29/20 1306 06/29/20 2036 06/30/20 0229 07/01/20 0556  WBC 27.3*  --   --   --   --   --  19.5* 13.9*  CREATININE 2.03*   < >  --  2.21* 1.73* 1.24* 1.19* 1.02*  LATICACIDVEN  --   --  2.6* 3.7* 2.9* 1.4  --   --    < > = values in this interval not displayed.    Estimated Creatinine Clearance: 45.7 mL/min (A) (by C-G formula based on SCr of 1.02 mg/dL (H)).    Allergies  Allergen Reactions  . Metformin And Related     Abdominal pain and diarrhea   Cefepime 7/5>> Flagyl 7/5>>  7/5: BC x 2>> no growth at 24 hr 7/5: Ucx>> no growth 24 hr  Thank you for allowing pharmacy to be a part of this patient's care.  9/5 07/01/2020 10:09 AM

## 2020-07-01 NOTE — Progress Notes (Signed)
PROGRESS NOTE    Samantha Paul  JYN:[id number] DOB: Nov 06, 1946 DOA: 06/29/2020 PCP: Rometta Emery, MD      Brief Narrative:  Samantha Paul is a 74 y.o. F with obesity, HTN, stroke with residual mild R sided weakness, and DM who presented with few days progressive abdominal pain in RUQ and vomiting.  In the ER, febrile, tachcyardic to 120s, leukocytosis.  CT showed acute cholecystitis.           Assessment & Plan:  Sepsis due to acute cholecystitis -Continue antibiotics, narrow to Ceftriaxone/Flagyl given no DR organism risk factors   New onset atrial fibrillation, paroxysmal -Discontinue diltiazem drip and transition this afternoon to oral diltiazem -Continue metoprolol -Start heparin gtt -TOC consult for Eliquis -Check T4/T3  Diabetic ketoacidosis Type 2 diabetes, no prior history, no other complications No previous diagnosis Glucoses elevated after Decadron, will monitor closely -Continue Levemir -Continue SS corrections  Acute renal failure  Patient presented with decreased urine output, creatinine 2 from baseline less than 1.  Now resolved to baseline.  History stroke Hypertension BP controlled.  Not on meds prior to admission -Continue metoprolol  Hyponatremia Stable, asymptomatic  HOCM -Continue beta-blocker        Disposition: Status is: Inpatient  Remains inpatient appropriate because:IV treatments appropriate due to intensity of illness or inability to take PO   Dispo: The patient is from: Home              Anticipated d/c is to: Home              Anticipated d/c date is: 3 days              Patient currently is not medically stable to d/c.              MDM: The below labs and imaging reports were reviewed and summarized above.  Medication management as above.   DVT prophylaxis: Heparin gtt  Code Status: FULL Family Communication:     Consultants:   General Surgery  Procedures:     Antimicrobials:      Culture  data:              Subjective: History collected through in person interpreter.  Mild headache, no chest pain, trouble breathing.  She has mild right upper quadrant pain.  No vomiting.  Objective: Vitals:   07/01/20 0930 07/01/20 0950 07/01/20 1143 07/01/20 1553  BP: 101/86 111/71 98/63 (!) 112/59  Pulse:   84 80  Resp:   18 19  Temp:   97.6 F (36.4 C) 97.6 F (36.4 C)  TempSrc:   Oral Oral  SpO2:   97% 97%  Weight:      Height:        Intake/Output Summary (Last 24 hours) at 07/01/2020 1950 Last data filed at 07/01/2020 1700 Gross per 24 hour  Intake 480 ml  Output 880 ml  Net -400 ml   Filed Weights   06/29/20 0055  Weight: 81.6 kg    Examination: General appearance: obese adult female, alert and in no obvious distress.  Lying in bed, appears tired HEENT: Anicteric, conjunctiva pink, lids and lashes normal. No nasal deformity, discharge, epistaxis.  Lips moist, denttiion in good repair, OP moist, no oral lesions, hearing normal.   Skin: Warm and dry.  No jaundice.  No suspicious rashes or lesions. Cardiac: Irregularly irregular, nl S1-S2, no murmurs appreciated.  Capillary refill is brisk.  JVP not visible.  No LE edema.  Radial  pulses 2+ and symmetric. Respiratory: Normal respiratory rate and rhythm.  CTAB without rales or wheezes. Abdomen: Abdomen soft.  Mild RUQ TTP with voluntary guarding. No ascites, distension, hepatosplenomegaly.  Recent lap chole incisions are clean dry and intact.   MSK: No deformities or effusions. Neuro: Awake and alert.  EOMI, moves all extremities with equal strength and coordination. Speech fluent.    Psych: Sensorium intact and responding to questions, attention normal. Affect normal.  Judgment and insight appear normal.    Data Reviewed: I have personally reviewed following labs and imaging studies:  CBC: Recent Labs  Lab 06/29/20 0105 06/30/20 0229 07/01/20 0556  WBC 27.3* 19.5* 13.9*  NEUTROABS  --  16.7* 12.3*  HGB  13.3 10.8* 10.0*  HCT 45.2 35.8* 33.1*  MCV 74.5* 72.8* 71.5*  PLT 204 136* 165   Basic Metabolic Panel: Recent Labs  Lab 06/29/20 0734 06/29/20 1306 06/29/20 2036 06/30/20 0229 07/01/20 0556  NA 125* 131* 131* 131* 132*  K 4.5 3.7 3.1* 4.1 4.1  CL 94* 98 101 100 104  CO2 15* 19* 19* 19* 17*  GLUCOSE 393* 146* 136* 160* 188*  BUN 42* 35* 26* 22 26*  CREATININE 2.21* 1.73* 1.24* 1.19* 1.02*  CALCIUM 8.2* 8.3* 8.0* 8.1* 7.9*  MG  --   --   --  1.5* 2.1  PHOS  --   --   --  2.3* 3.9   GFR: Estimated Creatinine Clearance: 45.7 mL/min (A) (by C-G formula based on SCr of 1.02 mg/dL (H)). Liver Function Tests: Recent Labs  Lab 06/29/20 0105 06/30/20 0229 07/01/20 0556  AST 26 23 43*  ALT 23 17 26   ALKPHOS 128* 100 112  BILITOT 2.3* 1.3* 0.5  PROT 7.1 5.6* 5.3*  ALBUMIN 2.9* 2.0* 1.9*   Recent Labs  Lab 06/29/20 0105  LIPASE 16   No results for input(s): AMMONIA in the last 168 hours. Coagulation Profile: Recent Labs  Lab 06/29/20 0318  INR 1.4*   Cardiac Enzymes: No results for input(s): CKTOTAL, CKMB, CKMBINDEX, TROPONINI in the last 168 hours. BNP (last 3 results) No results for input(s): PROBNP in the last 8760 hours. HbA1C: Recent Labs    06/29/20 0734  HGBA1C 12.7*   CBG: Recent Labs  Lab 06/30/20 2308 07/01/20 0415 07/01/20 0816 07/01/20 1104 07/01/20 1638  GLUCAP 269* 181* 235* 272* 257*   Lipid Profile: No results for input(s): CHOL, HDL, LDLCALC, TRIG, CHOLHDL, LDLDIRECT in the last 72 hours. Thyroid Function Tests: Recent Labs    07/01/20 0556  TSH 0.278*   Anemia Panel: Recent Labs    06/30/20 1004  VITAMINB12 601  FOLATE 23.0  FERRITIN 192  TIBC 197*  IRON 6*   Urine analysis:    Component Value Date/Time   COLORURINE YELLOW 06/29/2020 2050   APPEARANCEUR CLEAR 06/29/2020 2050   LABSPEC 1.010 06/29/2020 2050   PHURINE 5.0 06/29/2020 2050   GLUCOSEU 50 (A) 06/29/2020 2050   HGBUR SMALL (A) 06/29/2020 2050    BILIRUBINUR NEGATIVE 06/29/2020 2050   KETONESUR NEGATIVE 06/29/2020 2050   PROTEINUR 30 (A) 06/29/2020 2050   NITRITE NEGATIVE 06/29/2020 2050   LEUKOCYTESUR TRACE (A) 06/29/2020 2050   Sepsis Labs: @LABRCNTIP (procalcitonin:4,lacticacidven:4)  ) Recent Results (from the past 240 hour(s))  Blood culture (routine x 2)     Status: None (Preliminary result)   Collection Time: 06/29/20  3:50 AM   Specimen: BLOOD RIGHT ARM  Result Value Ref Range Status   Specimen Description BLOOD RIGHT ARM  Final   Special Requests   Final    BOTTLES DRAWN AEROBIC AND ANAEROBIC Blood Culture results may not be optimal due to an inadequate volume of blood received in culture bottles   Culture   Final    NO GROWTH 2 DAYS Performed at [facility], [address], [place], [place] [address]    Report Status PENDING  Incomplete  SARS Coronavirus 2 by RT PCR (hospital order, performed in [facility] hospital lab) Nasopharyngeal Nasopharyngeal Swab     Status: None   Collection Time: 06/29/20  5:00 AM   Specimen: Nasopharyngeal Swab  Result Value Ref Range Status   SARS Coronavirus 2 NEGATIVE NEGATIVE Final    Comment: (NOTE) SARS-CoV-2 target nucleic acids are NOT DETECTED.  The SARS-CoV-2 RNA is generally detectable in upper and lower respiratory specimens during the acute phase of infection. The lowest concentration of SARS-CoV-2 viral copies this assay can detect is 250 copies / mL. A negative result does not preclude SARS-CoV-2 infection and should not be used as the sole basis for treatment or other patient management decisions.  A negative result may occur with improper specimen collection / handling, submission of specimen other than nasopharyngeal swab, presence of viral mutation(s) within the areas targeted by this assay, and inadequate number of viral copies (<250 copies / mL). A negative result must be combined with clinical observations, patient history, and epidemiological  information.  Fact Sheet for Patients:   BoilerBrush.com.cy  Fact Sheet for Healthcare Providers: https://pope.com/  This test is not yet approved or  cleared by the [place] FDA and has been authorized for detection and/or diagnosis of SARS-CoV-2 by FDA under an Emergency Use Authorization (EUA).  This EUA will remain in effect (meaning this test can be used) for the duration of the COVID-19 declaration under Section 564(b)(1) of the Act, 21 U.S.C. section 360bbb-3(b)(1), unless the authorization is terminated or revoked sooner.  Performed at [facility], [address], [place], [place] [address]   Urine culture     Status: None   Collection Time: 06/29/20  8:50 PM   Specimen: In/Out Cath Urine  Result Value Ref Range Status   Specimen Description IN/OUT CATH URINE  Final   Special Requests NONE  Final   Culture   Final    NO GROWTH Performed at [facility], [address] [place], [place] [address]    Report Status 07/01/2020 FINAL  Final         Radiology Studies: No results found.      Scheduled Meds: . acetaminophen  650 mg Oral Q4H  . insulin aspart  0-6 Units Subcutaneous Q4H  . insulin detemir  6 Units Subcutaneous BID  . metoprolol tartrate  12.5 mg Oral BID   Continuous Infusions: . cefTRIAXone (ROCEPHIN)  IV 2 g (07/01/20 1712)  . diltiazem (CARDIZEM) infusion 10 mg/hr (07/01/20 1237)  . heparin 900 Units/hr (07/01/20 1140)  . metronidazole 500 mg (07/01/20 1317)     LOS: 2 days    Time spent: 35 minutes    Alberteen Sam, MD [facility] 07/01/2020, 7:50 PM     Please page though AMION or Epic secure chat:  For [employer], Higher education careers adviser

## 2020-07-01 NOTE — Progress Notes (Signed)
1 Day Post-Op  Subjective: Patient eating her breakfast when I walked in.  Having some pain it seems.  Husband at bedside who understands limited [place].  Denies nausea.   ROS: unable due to language barrier  Objective: Vital signs in last 24 hours: Temp:  [97.4 F (36.3 C)-99.2 F (37.3 C)] 97.4 F (36.3 C) (07/07 0411) Pulse Rate:  [76-142] 92 (07/07 0411) Resp:  [16-29] 18 (07/07 0411) BP: (100-143)/(54-86) 100/73 (07/07 0411) SpO2:  [94 %-100 %] 96 % (07/07 0411) Last BM Date: 06/29/20  Intake/Output from previous day: 07/06 0701 - 07/07 0700 In: 2898.5 [P.O.:480; I.V.:2181.3; IV Piggyback:237.3] Out: 780 [Urine:550; Drains:220; Blood:10] Intake/Output this shift: No intake/output data recorded.  PE: Heart: irregularly irregular, HR in 130-140s Lungs: CTAB Abd: soft, appropriately tender, +BS, ND, incisions c/d/i, drain with serosang output.  Lab Results:  Recent Labs    06/30/20 0229 07/01/20 0556  WBC 19.5* 13.9*  HGB 10.8* 10.0*  HCT 35.8* 33.1*  PLT 136* 165   BMET Recent Labs    06/30/20 0229 07/01/20 0556  NA 131* 132*  K 4.1 4.1  CL 100 104  CO2 19* 17*  GLUCOSE 160* 188*  BUN 22 26*  CREATININE 1.19* 1.02*  CALCIUM 8.1* 7.9*   PT/INR Recent Labs    06/29/20 0318  LABPROT 16.3*  INR 1.4*   CMP     Component Value Date/Time   NA 132 (L) 07/01/2020 0556   K 4.1 07/01/2020 0556   CL 104 07/01/2020 0556   CO2 17 (L) 07/01/2020 0556   GLUCOSE 188 (H) 07/01/2020 0556   BUN 26 (H) 07/01/2020 0556   CREATININE 1.02 (H) 07/01/2020 0556   CALCIUM 7.9 (L) 07/01/2020 0556   PROT 5.3 (L) 07/01/2020 0556   ALBUMIN 1.9 (L) 07/01/2020 0556   AST 43 (H) 07/01/2020 0556   ALT 26 07/01/2020 0556   ALKPHOS 112 07/01/2020 0556   BILITOT 0.5 07/01/2020 0556   GFRNONAA 55 (L) 07/01/2020 0556   GFRAA >60 07/01/2020 0556   Lipase     Component Value Date/Time   LIPASE 16 06/29/2020 0105       Studies/Results: No results  found.  Anti-infectives: Anti-infectives (From admission, onward)   Start     Dose/Rate Route Frequency Ordered Stop   06/30/20 0800  ceFEPIme (MAXIPIME) 2 g in sodium chloride 0.9 % 100 mL IVPB     Discontinue     2 g 200 mL/hr over 30 Minutes Intravenous Every 24 hours 06/29/20 0553     06/29/20 1400  metroNIDAZOLE (FLAGYL) IVPB 500 mg     Discontinue     500 mg 100 mL/hr over 60 Minutes Intravenous Every 8 hours 06/29/20 0553     06/29/20 0315  ceFEPIme (MAXIPIME) 2 g in sodium chloride 0.9 % 100 mL IVPB        2 g 200 mL/hr over 30 Minutes Intravenous  Once 06/29/20 0301 06/29/20 0603   06/29/20 0315  metroNIDAZOLE (FLAGYL) IVPB 500 mg        500 mg 100 mL/hr over 60 Minutes Intravenous  Once 06/29/20 0301 06/29/20 0620       Assessment/Plan A fib with RVR, per primary.  Ok to start anticoagulation, if warranted, from out standpoint, but no bolus.  hgb stable   H/O CVA HTN  POD 1, s/p lap chole for gangrenous cholecystitis, Dr. Janee Morn, 06/30/20 -doing well surgically. -drain intact and looks good.  No bile present currently.  Will keep  the drain in place for right now and follow output -wbc downtrending to 13 from 19K.  Cont abx therapy for now -LFTs improved -mobilize, pulm toilet, IS -cont HH/CM diet -will follow  FEN - HH/CM diet VTE - ok for chemical prophylaxis or anticoagulation if needed (no bolus) ID - Maxipime/Flagyl   LOS: 2 days    Letha Cape , [facility] 07/01/2020, 7:39 AM Please see Amion for pager number during day hours 7:00am-4:30pm or 7:00am -11:30am on weekends

## 2020-07-01 NOTE — Procedures (Signed)
Heart rate is too high for accurate echo at this time. 

## 2020-07-01 NOTE — Progress Notes (Signed)
Spoke with interpretor regarding echo. Interpretor explained to patient about echo. Patient agreeable and educated about echo.  Ginette Otto, RN

## 2020-07-01 NOTE — Progress Notes (Signed)
ANTICOAGULATION CONSULT NOTE - Initial Consult  Pharmacy Consult for Heparin Indication: atrial fibrillation  Allergies  Allergen Reactions   Metformin And Related     Abdominal pain and diarrhea    Patient Measurements: Height: 4\' 10"  (147.3 cm) Weight: 81.6 kg (180 lb) IBW/kg (Calculated) : 40.9 Heparin Dosing Weight: 60.3  Vital Signs: Temp: 97.5 F (36.4 C) (07/07 0746) Temp Source: Oral (07/07 0746) BP: 111/71 (07/07 0950) Pulse Rate: 153 (07/07 0746)  Labs: Recent Labs    06/29/20 0105 06/29/20 0105 06/29/20 0318 06/29/20 0734 06/29/20 2036 06/30/20 0229 07/01/20 0556  HGB 13.3   < >  --   --   --  10.8* 10.0*  HCT 45.2  --   --   --   --  35.8* 33.1*  PLT 204  --   --   --   --  136* 165  APTT  --   --  35  --   --   --   --   LABPROT  --   --  16.3*  --   --   --   --   INR  --   --  1.4*  --   --   --   --   CREATININE 2.03*  --   --    < > 1.24* 1.19* 1.02*   < > = values in this interval not displayed.    Estimated Creatinine Clearance: 45.7 mL/min (A) (by C-G formula based on SCr of 1.02 mg/dL (H)).   Medical History: Past Medical History:  Diagnosis Date   Back pain    HTN (hypertension)    Knee pain    Stroke [facility])     Assessment: 7/7 initiating heparin drip for afib (no bolus) 15 U/kg/hr. Hgb 10, Hct 33.1, Plt 165  Goal of Therapy:  Heparin level 0.3-0.7 units/ml Monitor platelets by anticoagulation protocol: Yes   Plan:  No bolus per physician  Start heparin infusion at 900 units/hr Check anti-Xa level in 6 hours and daily while on heparin Continue to monitor H&H and platelets  [facility] 07/01/2020,10:56 AM

## 2020-07-01 NOTE — Progress Notes (Signed)
Inpatient Diabetes Program Recommendations  AACE/ADA: New Consensus Statement on Inpatient Glycemic Control (2015)  Target Ranges:  Prepandial:   less than 140 mg/dL      Peak postprandial:   less than 180 mg/dL (1-2 hours)      Critically ill patients:  140 - 180 mg/dL   Lab Results  Component Value Date   GLUCAP 235 (H) 07/01/2020   HGBA1C 12.7 (H) 06/29/2020    Educated pt through in-person interpreter regarding A1c level, Glucose and A1c goals, and diet education. Discussed plan of care with Dr. Maryfrances Bunnell. Pt to be here a couple more days, will reassess Friday if pt would benefit from insulin at home versus oral medications. Renal function slightly elevated. Decadron 5 mg given yesterday. Noted TSH low and reported to Dr. Maryfrances Bunnell.  Will need to set up time with husband and interpreter closer to d/c for further education.  Glucose trends elevated, will watch for now as decadron was given yesterday.  Thanks,  Christena Deem RN, MSN, BC-ADM Inpatient Diabetes Coordinator Team Pager [phone] (8a-5p)

## 2020-07-02 ENCOUNTER — Other Ambulatory Visit (HOSPITAL_COMMUNITY): Payer: Medicare Other

## 2020-07-02 LAB — COMPREHENSIVE METABOLIC PANEL
ALT: 19 U/L (ref 0–44)
AST: 25 U/L (ref 15–41)
Albumin: 1.8 g/dL — ABNORMAL LOW (ref 3.5–5.0)
Alkaline Phosphatase: 109 U/L (ref 38–126)
Anion gap: 9 (ref 5–15)
BUN: 22 mg/dL (ref 8–23)
CO2: 17 mmol/L — ABNORMAL LOW (ref 22–32)
Calcium: 7.7 mg/dL — ABNORMAL LOW (ref 8.9–10.3)
Chloride: 108 mmol/L (ref 98–111)
Creatinine, Ser: 1.1 mg/dL — ABNORMAL HIGH (ref 0.44–1.00)
GFR calc Af Amer: 58 mL/min — ABNORMAL LOW (ref >60)
GFR calc non Af Amer: 50 mL/min — ABNORMAL LOW (ref >60)
Glucose, Bld: 306 mg/dL — ABNORMAL HIGH (ref 70–99)
Potassium: 4 mmol/L (ref 3.5–5.1)
Sodium: 134 mmol/L — ABNORMAL LOW (ref 135–145)
Total Bilirubin: 0.2 mg/dL — ABNORMAL LOW (ref 0.3–1.2)
Total Protein: 5.2 g/dL — ABNORMAL LOW (ref 6.5–8.1)

## 2020-07-02 LAB — CBC
HCT: 34.9 % — ABNORMAL LOW (ref 36.0–46.0)
Hemoglobin: 10.8 g/dL — ABNORMAL LOW (ref 12.0–15.0)
MCH: 21.6 pg — ABNORMAL LOW (ref 26.0–34.0)
MCHC: 30.9 g/dL (ref 30.0–36.0)
MCV: 69.8 fL — ABNORMAL LOW (ref 80.0–100.0)
Platelets: 242 10*3/uL (ref 150–400)
RBC: 5 MIL/uL (ref 3.87–5.11)
RDW: 14 % (ref 11.5–15.5)
WBC: 15.4 10*3/uL — ABNORMAL HIGH (ref 4.0–10.5)
nRBC: 0.3 % — ABNORMAL HIGH (ref 0.0–0.2)

## 2020-07-02 LAB — T4, FREE: Free T4: 1.03 ng/dL (ref 0.61–1.12)

## 2020-07-02 LAB — GLUCOSE, CAPILLARY
Glucose-Capillary: 197 mg/dL — ABNORMAL HIGH (ref 70–99)
Glucose-Capillary: 258 mg/dL — ABNORMAL HIGH (ref 70–99)
Glucose-Capillary: 249 mg/dL — ABNORMAL HIGH (ref 70–99)
Glucose-Capillary: 331 mg/dL — ABNORMAL HIGH (ref 70–99)
Glucose-Capillary: 343 mg/dL — ABNORMAL HIGH (ref 70–99)

## 2020-07-02 LAB — HEPARIN LEVEL (UNFRACTIONATED): Heparin Unfractionated: 0.36 IU/mL (ref 0.30–0.70)

## 2020-07-02 LAB — MAGNESIUM: Magnesium: 1.8 mg/dL (ref 1.7–2.4)

## 2020-07-02 MED ORDER — INSULIN DETEMIR 100 UNIT/ML ~~LOC~~ SOLN
8.0000 [IU] | Freq: Two times a day (BID) | SUBCUTANEOUS | Status: DC
  Administered 2020-07-02 – 2020-07-03 (×2): 8 [IU] via SUBCUTANEOUS
  Filled 2020-07-02 (×3): qty 0.08

## 2020-07-02 MED ORDER — INSULIN ASPART 100 UNIT/ML ~~LOC~~ SOLN
0.0000 [IU] | Freq: Three times a day (TID) | SUBCUTANEOUS | Status: DC
  Administered 2020-07-02: 11 [IU] via SUBCUTANEOUS
  Administered 2020-07-02: 5 [IU] via SUBCUTANEOUS
  Administered 2020-07-03: 11 [IU] via SUBCUTANEOUS
  Administered 2020-07-03: 15 [IU] via SUBCUTANEOUS

## 2020-07-02 MED ORDER — APIXABAN 5 MG PO TABS
5.0000 mg | ORAL_TABLET | Freq: Two times a day (BID) | ORAL | Status: DC
  Administered 2020-07-03: 5 mg via ORAL
  Filled 2020-07-02: qty 1

## 2020-07-02 MED ORDER — INSULIN ASPART 100 UNIT/ML ~~LOC~~ SOLN
0.0000 [IU] | Freq: Every day | SUBCUTANEOUS | Status: DC
  Administered 2020-07-02: 4 [IU] via SUBCUTANEOUS

## 2020-07-02 NOTE — Care Management Important Message (Signed)
Important Message  Patient Details  Name: Samantha Paul MRN: [id number] Date of Birth: [birth year]   Medicare Important Message Given:  Yes     Renie Ora 07/02/2020, 9:38 AM

## 2020-07-02 NOTE — Progress Notes (Signed)
2 Days Post-Op   Subjective/Chief Complaint: Eating well. Much less pain - just a little at incisions.   Objective: Vital signs in last 24 hours: Temp:  [97.6 F (36.4 C)-98 F (36.7 C)] 97.7 F (36.5 C) (07/08 0744) Pulse Rate:  [60-86] 86 (07/08 0744) Resp:  [15-19] 16 (07/08 0744) BP: (98-138)/(59-77) 138/75 (07/08 0744) SpO2:  [97 %-99 %] 99 % (07/08 0744) Last BM Date: 06/30/20  Intake/Output from previous day: 07/07 0701 - 07/08 0700 In: 720 [P.O.:720] Out: 995 [Urine:925; Drains:70] Intake/Output this shift: Total I/O In: -  Out: 200 [Urine:200]  General appearance: cooperative Cardio: irregularly irregular rhythm GI: soft, incisions CDI, JP ss  Lab Results:  Recent Labs    06/30/20 0229 07/01/20 0556  WBC 19.5* 13.9*  HGB 10.8* 10.0*  HCT 35.8* 33.1*  PLT 136* 165   BMET Recent Labs    06/30/20 0229 07/01/20 0556  NA 131* 132*  K 4.1 4.1  CL 100 104  CO2 19* 17*  GLUCOSE 160* 188*  BUN 22 26*  CREATININE 1.19* 1.02*  CALCIUM 8.1* 7.9*   PT/INR No results for input(s): LABPROT, INR in the last 72 hours. ABG No results for input(s): PHART, HCO3 in the last 72 hours.  Invalid input(s): PCO2, PO2  Studies/Results: No results found.  Anti-infectives: Anti-infectives (From admission, onward)   Start     Dose/Rate Route Frequency Ordered Stop   07/01/20 2200  ceFEPIme (MAXIPIME) 2 g in sodium chloride 0.9 % 100 mL IVPB  Status:  Discontinued        2 g 200 mL/hr over 30 Minutes Intravenous Every 12 hours 07/01/20 1005 07/01/20 1049   07/01/20 1800  cefTRIAXone (ROCEPHIN) 2 g in sodium chloride 0.9 % 100 mL IVPB     Discontinue     2 g 200 mL/hr over 30 Minutes Intravenous Every 24 hours 07/01/20 1049     06/30/20 0800  ceFEPIme (MAXIPIME) 2 g in sodium chloride 0.9 % 100 mL IVPB  Status:  Discontinued        2 g 200 mL/hr over 30 Minutes Intravenous Every 24 hours 06/29/20 0553 07/01/20 1005   06/29/20 1400  metroNIDAZOLE (FLAGYL) IVPB 500  mg     Discontinue     500 mg 100 mL/hr over 60 Minutes Intravenous Every 8 hours 06/29/20 0553     06/29/20 0315  ceFEPIme (MAXIPIME) 2 g in sodium chloride 0.9 % 100 mL IVPB        2 g 200 mL/hr over 30 Minutes Intravenous  Once 06/29/20 0301 06/29/20 0603   06/29/20 0315  metroNIDAZOLE (FLAGYL) IVPB 500 mg        500 mg 100 mL/hr over 60 Minutes Intravenous  Once 06/29/20 0301 06/29/20 7619      Assessment/Plan: POD 2, s/p lap chole for gangrenous cholecystitis, Dr. Janee Morn, 06/30/20 -doing well surgically. -drain SS (70/24h) -will D/C drain before D/C  AF RVR - per primary. On heparin drip. Eliquis planned.  LOS: 3 days    Liz Malady 07/02/2020

## 2020-07-02 NOTE — Progress Notes (Signed)
PROGRESS NOTE    Telena Peyser  ZOX:[id number] DOB: [birth year] DOA: 06/29/2020 PCP: Rometta Emery, MD      Brief Narrative:  Mrs. Verge is a 74 y.o. F with obesity, HTN, stroke with residual mild R sided weakness, and DM who presented with few days progressive abdominal pain in RUQ and vomiting.  In the ER, febrile, tachcyardic to 120s, leukocytosis.  CT showed acute cholecystitis.           Assessment & Plan:  Sepsis due to acute cholecystitis S/p lap chole by Dr. Janee Morn on 7/6 Improving, 75 cc output from drain yesterday. -Continue ceftriaxone Flagyl -PT eval -Consult surgery, appreciate recommendations   Paroxysmal atrial fibrillation, new onset CHA2DS2-Vasc 6. HRs controlled.  Eliquis co-pay is $147. -Continue new diltiazem -Continue metoprolol -Continue heparin for now, no obvious active bleeding -Transition to Eliquis tomorrow morning -Follow-up echocardiogram  Diabetic ketoacidosis Type 2 diabetes, no prior history, no complications No previous diagnosis  Hemoglobin A1c greater than 12%.  Glucose is here still elevated. -Continue Levemir, increase dose -Continue sliding scale corrections, increased dose   Acute renal failure  Patient presented with decreased urine output, creatinine 2 from baseline less than 1.  Now resolved to baseline.  History stroke Hypertension BP controlled.  Not on meds prior to admission -Continue metoprolol  Hyponatremia Stable, asymptomatic  HOCM -Continue beta-blocker  Abnormal TSH Euthyroid sick.  Free T4 normal. -No further follow-up    Disposition: Status is: Inpatient  Remains inpatient appropriate because:IV treatments appropriate due to intensity of illness or inability to take PO   Dispo: The patient is from: Home              Anticipated d/c is to: Home              Anticipated d/c date is: 3 days              Patient currently is not medically stable to d/c.              MDM: The below  labs and imaging reports reviewed and summarized above.  Medication management as above.     DVT prophylaxis: Not applicable, on heparin  Code Status: FULL Family Communication: Husband interpreter    Consultants:   General Surgery  Procedures:   7/6 laparoscopic cholecystectomy  Antimicrobials:   Cefepime 7/4-7/7  Ceftriaxone 7/7>>  Flagyl 7/4>>  Culture data:   Blood culture 7/4-no growth  Urine culture 7/5, no growth          Subjective: H all history collected through person interpreter.  No headache, chest pain, trouble breathing.  Her right upper quadrant pain is improving.  No fever overnight.  No vomiting.  Objective: Vitals:   07/02/20 0300 07/02/20 0744 07/02/20 1135 07/02/20 1552  BP: 111/71 138/75 107/88 (!) 119/55  Pulse: 71 86 77 74  Resp: 15 16 15 20   Temp: 97.8 F (36.6 C) 97.7 F (36.5 C) 97.7 F (36.5 C) 97.6 F (36.4 C)  TempSrc: Oral Oral Oral Oral  SpO2: 97% 99% 97% 99%  Weight:      Height:        Intake/Output Summary (Last 24 hours) at 07/02/2020 2024 Last data filed at 07/02/2020 1850 Gross per 24 hour  Intake 1660.8 ml  Output 1185 ml  Net 475.8 ml   Filed Weights   06/29/20 0055  Weight: 81.6 kg    Examination: General appearance: Obese adult female, sitting up in recliner.  Interactive.  HEENT: Oropharynx moist, no oral lesions, hearing normal, anicteric, conjunctival pink, lids lashes normal.  No nasal deformity, discharge, or epistaxis. Skin: Skin warm and dry, incisions appear clean dry and intact. Cardiac: Irregularly irregular, rate normal, no murmurs, JVP not visible, no lower extremity edema Respiratory: Normal respiratory rate and rhythm, lungs clear without rales or wheezes Abdomen: Abdomen soft, mild improvement in right upper quadrant tenderness to palpation, no guarding. MSK: Normal muscle bulk and tone Neuro: Movements intact, moves all extremities with normal strength and coordination, maybe some  generalized weakness, throughout.  Speech fluent. Psych: Attention normal, affect normal, judgment insight appear normal     Data Reviewed: I have personally reviewed following labs and imaging studies:  CBC: Recent Labs  Lab 06/29/20 0105 06/30/20 0229 07/01/20 0556 07/02/20 0950  WBC 27.3* 19.5* 13.9* 15.4*  NEUTROABS  --  16.7* 12.3*  --   HGB 13.3 10.8* 10.0* 10.8*  HCT 45.2 35.8* 33.1* 34.9*  MCV 74.5* 72.8* 71.5* 69.8*  PLT 204 136* 165 242   Basic Metabolic Panel: Recent Labs  Lab 06/29/20 1306 06/29/20 2036 06/30/20 0229 07/01/20 0556 07/02/20 1825  NA 131* 131* 131* 132* 134*  K 3.7 3.1* 4.1 4.1 4.0  CL 98 101 100 104 108  CO2 19* 19* 19* 17* 17*  GLUCOSE 146* 136* 160* 188* 306*  BUN 35* 26* 22 26* 22  CREATININE 1.73* 1.24* 1.19* 1.02* 1.10*  CALCIUM 8.3* 8.0* 8.1* 7.9* 7.7*  MG  --   --  1.5* 2.1 1.8  PHOS  --   --  2.3* 3.9  --    GFR: Estimated Creatinine Clearance: 42.4 mL/min (A) (by C-G formula based on SCr of 1.1 mg/dL (H)). Liver Function Tests: Recent Labs  Lab 06/29/20 0105 06/30/20 0229 07/01/20 0556 07/02/20 1825  AST 26 23 43* 25  ALT 23 17 26 19   ALKPHOS 128* 100 112 109  BILITOT 2.3* 1.3* 0.5 0.2*  PROT 7.1 5.6* 5.3* 5.2*  ALBUMIN 2.9* 2.0* 1.9* 1.8*   Recent Labs  Lab 06/29/20 0105  LIPASE 16   No results for input(s): AMMONIA in the last 168 hours. Coagulation Profile: Recent Labs  Lab 06/29/20 0318  INR 1.4*   Cardiac Enzymes: No results for input(s): CKTOTAL, CKMB, CKMBINDEX, TROPONINI in the last 168 hours. BNP (last 3 results) No results for input(s): PROBNP in the last 8760 hours. HbA1C: No results for input(s): HGBA1C in the last 72 hours. CBG: Recent Labs  Lab 07/01/20 2348 07/02/20 0415 07/02/20 0748 07/02/20 1141 07/02/20 1547  GLUCAP 278* 197* 258* 249* 331*   Lipid Profile: No results for input(s): CHOL, HDL, LDLCALC, TRIG, CHOLHDL, LDLDIRECT in the last 72 hours. Thyroid Function  Tests: Recent Labs    07/01/20 0556 07/02/20 0950  TSH 0.278*  --   FREET4  --  1.03   Anemia Panel: Recent Labs    06/30/20 1004  VITAMINB12 601  FOLATE 23.0  FERRITIN 192  TIBC 197*  IRON 6*   Urine analysis:    Component Value Date/Time   COLORURINE YELLOW 06/29/2020 2050   APPEARANCEUR CLEAR 06/29/2020 2050   LABSPEC 1.010 06/29/2020 2050   PHURINE 5.0 06/29/2020 2050   GLUCOSEU 50 (A) 06/29/2020 2050   HGBUR SMALL (A) 06/29/2020 2050   BILIRUBINUR NEGATIVE 06/29/2020 2050   KETONESUR NEGATIVE 06/29/2020 2050   PROTEINUR 30 (A) 06/29/2020 2050   NITRITE NEGATIVE 06/29/2020 2050   LEUKOCYTESUR TRACE (A) 06/29/2020 2050   Sepsis Labs: @LABRCNTIP (procalcitonin:4,lacticacidven:4)  )  Recent Results (from the past 240 hour(s))  Blood culture (routine x 2)     Status: None (Preliminary result)   Collection Time: 06/29/20  3:50 AM   Specimen: BLOOD RIGHT ARM  Result Value Ref Range Status   Specimen Description BLOOD RIGHT ARM  Final   Special Requests   Final    BOTTLES DRAWN AEROBIC AND ANAEROBIC Blood Culture results may not be optimal due to an inadequate volume of blood received in culture bottles   Culture   Final    NO GROWTH 3 DAYS Performed at [facility], [address] [place], [place] [address]    Report Status PENDING  Incomplete  SARS Coronavirus 2 by RT PCR (hospital order, performed in [facility] hospital lab) Nasopharyngeal Nasopharyngeal Swab     Status: None   Collection Time: 06/29/20  5:00 AM   Specimen: Nasopharyngeal Swab  Result Value Ref Range Status   SARS Coronavirus 2 NEGATIVE NEGATIVE Final    Comment: (NOTE) SARS-CoV-2 target nucleic acids are NOT DETECTED.  The SARS-CoV-2 RNA is generally detectable in upper and lower respiratory specimens during the acute phase of infection. The lowest concentration of SARS-CoV-2 viral copies this assay can detect is 250 copies / mL. A negative result does not preclude SARS-CoV-2  infection and should not be used as the sole basis for treatment or other patient management decisions.  A negative result may occur with improper specimen collection / handling, submission of specimen other than nasopharyngeal swab, presence of viral mutation(s) within the areas targeted by this assay, and inadequate number of viral copies (<250 copies / mL). A negative result must be combined with clinical observations, patient history, and epidemiological information.  Fact Sheet for Patients:   BoilerBrush.com.cy  Fact Sheet for Healthcare Providers: https://pope.com/  This test is not yet approved or  cleared by the [place] FDA and has been authorized for detection and/or diagnosis of SARS-CoV-2 by FDA under an Emergency Use Authorization (EUA).  This EUA will remain in effect (meaning this test can be used) for the duration of the COVID-19 declaration under Section 564(b)(1) of the Act, 21 U.S.C. section 360bbb-3(b)(1), unless the authorization is terminated or revoked sooner.  Performed at [facility], [address], [place], [place] [address]   Urine culture     Status: None   Collection Time: 06/29/20  8:50 PM   Specimen: In/Out Cath Urine  Result Value Ref Range Status   Specimen Description IN/OUT CATH URINE  Final   Special Requests NONE  Final   Culture   Final    NO GROWTH Performed at [facility], [address] [place], [place] [address]    Report Status 07/01/2020 FINAL  Final         Radiology Studies: No results found.      Scheduled Meds: . acetaminophen  650 mg Oral Q4H  . [START ON 07/03/2020] apixaban  5 mg Oral BID  . diltiazem  60 mg Oral Q6H  . insulin aspart  0-15 Units Subcutaneous TID WC  . insulin aspart  0-5 Units Subcutaneous QHS  . insulin detemir  8 Units Subcutaneous BID  . metoprolol tartrate  12.5 mg Oral BID   Continuous Infusions: . cefTRIAXone  (ROCEPHIN)  IV 2 g (07/02/20 1710)  . heparin 1,100 Units/hr (07/02/20 1157)  . metronidazole 500 mg (07/02/20 1337)     LOS: 3 days    Time spent: 35 minutes  Alberteen Sam, MD [facility] 07/02/2020, 8:24 PM     Please page though AMION or Epic secure chat:  For [employer], Higher education careers adviser

## 2020-07-02 NOTE — Progress Notes (Signed)
ANTICOAGULATION CONSULT NOTE - Follow Up Consult  Pharmacy Consult for Heparin > apixaban Indication: atrial fibrillation  Allergies  Allergen Reactions  . Metformin And Related     Abdominal pain and diarrhea    Patient Measurements: Height: 4\' 10"  (147.3 cm) Weight: 81.6 kg (180 lb) IBW/kg (Calculated) : 40.9 Heparin Dosing Weight: 60.3  Vital Signs: Temp: 97.7 F (36.5 C) (07/08 1135) Temp Source: Oral (07/08 1135) BP: 107/88 (07/08 1135) Pulse Rate: 77 (07/08 1135)  Labs: Recent Labs    06/29/20 2036 06/30/20 0229 06/30/20 0229 07/01/20 0556 07/01/20 1840 07/02/20 0950  HGB  --  10.8*   < > 10.0*  --  10.8*  HCT  --  35.8*  --  33.1*  --  34.9*  PLT  --  136*  --  165  --  242  HEPARINUNFRC  --   --   --   --  <0.10* 0.36  CREATININE 1.24* 1.19*  --  1.02*  --   --    < > = values in this interval not displayed.    Estimated Creatinine Clearance: 45.7 mL/min (A) (by C-G formula based on SCr of 1.02 mg/dL (H)).   Medical History: Past Medical History:  Diagnosis Date  . Back pain   . HTN (hypertension)   . Knee pain   . Stroke [facility])     Assessment: 73yof admitted with acute cholecystitis s/p lap chole, found to have new Afib RVR > CVR on diltiazem drip.  Heparin drip 1100 uts/hr HL 0.36 at goal.  CBC stable no bleeding Continue heparin drip today and convert to apixaban 5mg  BID tomorrow Dose appropriate Cr < 1.5 wt > 60kg, age < 80  Goal of Therapy:  Heparin level 0.3-0.7 units/ml Monitor platelets by anticoagulation protocol: Yes   Plan:  Heparin drip 1100 uts/hr Stop heparin in am and start apixaban 5mg  BID  [facility] Pharm.D. CPP, BCPS Clinical Pharmacist [phone] 07/02/2020 1:04 PM

## 2020-07-02 NOTE — Progress Notes (Signed)
Inpatient Diabetes Program Recommendations  AACE/ADA: New Consensus Statement on Inpatient Glycemic Control (2015)  Target Ranges:  Prepandial:   less than 140 mg/dL      Peak postprandial:   less than 180 mg/dL (1-2 hours)      Critically ill patients:  140 - 180 mg/dL   Lab Results  Component Value Date   GLUCAP 258 (H) 07/02/2020   HGBA1C 12.7 (H) 06/29/2020    Review of Glycemic Control  Results for SOSIE, GATO (MRN [id number]) as of 07/02/2020 10:52  Ref. Range 07/01/2020 08:16 07/01/2020 11:04 07/01/2020 16:38 07/01/2020 21:03 07/01/2020 23:48 07/02/2020 04:15 07/02/2020 07:48  Glucose-Capillary Latest Ref Range: 70 - 99 mg/dL 458 (H) 099 (H) 833 (H) 325 (H) 278 (H) 197 (H) 258 (H)   Diabetes history: DM 2 Outpatient Diabetes medications: Amaryl 1 mg Daily Current orders for Inpatient glycemic control:  Levemir 6 units bid Novolog 0-6 units Q4 hours  Decadron 5 mg given 7/6  Inpatient Diabetes Program Recommendations:    Fasting glucose 250's. -  Increase Levemir to 8 units bid -  Increase Novolog Correction to 0-9 units Q4 hours.  Discussed with Dr. Maryfrances Bunnell. New orders placed.  Thanks,  Christena Deem RN, MSN, BC-ADM Inpatient Diabetes Coordinator Team Pager [phone] (8a-5p)

## 2020-07-02 NOTE — TOC Benefit Eligibility Note (Signed)
Transition of Care [facility]) Benefit Eligibility Note    Patient Details  Name: Samantha Paul MRN: [id number] Date of Birth: 1946/08/27   Medication/Dose: ELIQUIS  5 MG BID  Covered?: Yes  Tier: 3 Drug  Prescription Coverage [facility]  [place] with Person/Company/Phone Number:: STEWART  @ [employer] # [phone]  Co-Pay: $147.00  Prior Approval: No  Deductible: Unmet (OUT-OF-POCKET:UNMET)       Mardene Sayer Phone Number: 07/02/2020, 9:49 AM

## 2020-07-02 NOTE — Evaluation (Signed)
Physical Therapy Evaluation Patient Details Name: Samantha Paul MRN: [id number] DOB: 12-01-1946 Today's Date: 07/02/2020   History of Present Illness  Pt is a 74 yo female presenting with abdominal pain x3 days, workup revealed sepsis from acute cholecystitis. Upon admission, pt also found to have DKA with acute renal failure. PMH includes: stroke with R-sided hemiplegia, HTN, and DM II.  Clinical Impression  Pt in bed upon arrival of PT, agreeable to evaluation at this time. Prior to admission the pt was ambulating with use of a cane while living with family. The pt now presents with limitations in functional mobility, stability, strength, and activity tolerance due to above dx and reduces activity, and will continue to benefit from skilled PT to address these deficits. The pt was able to demo preliminary bed mobility with minA, as well as transfers and short ambulation with minA of 1 through HHA to maintain balance. The pt reports sig weakness in her R-sided extremities from her prior stroke, but did not have any episodes of buckling during mobility today. The pt does present with deficits in generalized strength and endurance that limit her ability to safely mobilize and maintain stability with movement. The pt will continue to benefit from skilled PT acutely as well as following d/c to progress independence with mobility.      Follow Up Recommendations Home health PT;Supervision for mobility/OOB    Equipment Recommendations  Rolling walker with 5" wheels    Recommendations for Other Services       Precautions / Restrictions Precautions Precautions: Fall Precaution Comments: pt had a fall off of BSC in hospital, denies other falls, pt speaks [name], husband speaks [place] and can translate to wife Restrictions Weight Bearing Restrictions: No Other Position/Activity Restrictions: pt speaks [name], husband speaks [place] and can translate to wife      Mobility  Bed  Mobility Overal bed mobility: Needs Assistance Bed Mobility: Supine to Sit     Supine to sit: Min assist     General bed mobility comments: minA to complete elevation of trunk, pt able to move LE. minA to scoot to EOB  Transfers Overall transfer level: Needs assistance Equipment used: None;1 person hand held assist Transfers: Sit to/from Stand Sit to Stand: Min guard         General transfer comment: minG to rise initially, pt seeking HHA of 1 once in standing.  Ambulation/Gait Ambulation/Gait assistance: Min assist Gait Distance (Feet): 3 Feet Assistive device: 1 person hand held assist Gait Pattern/deviations: Step-to pattern;Decreased stride length;Shuffle   Gait velocity interpretation: <1.31 ft/sec, indicative of household ambulator General Gait Details: pt able to take short, shuffling steps with minimal clearance to reach recliner. No LOB noted, but pt seeking UE support. no evidence of knee buckling.      Balance Overall balance assessment: Needs assistance Sitting-balance support: Single extremity supported;Feet supported Sitting balance-Leahy Scale: Fair     Standing balance support: Single extremity supported;During functional activity Standing balance-Leahy Scale: Fair Standing balance comment: pt requires UE support to maintain static stance, without UE support, pt uses bed or chair to brace BLE                             Pertinent Vitals/Pain Pain Assessment: Faces Faces Pain Scale: Hurts a little bit Pain Location: incision site Pain Descriptors / Indicators: Grimacing Pain Intervention(s): Limited activity within patient's tolerance;Monitored during session;Repositioned    Home Living Family/patient expects to be  discharged to:: Private residence Living Arrangements: Spouse/significant other (spouse and son and daiguter in Social worker, and 3 grandchildren) Available Help at Discharge: Family;Available 24 hours/day Type of Home: House Home  Access: Stairs to enter Entrance Stairs-Rails: Can reach both Entrance Stairs-Number of Steps: 4 Home Layout: One level Home Equipment: Cane - single point;Cane - quad;Wheelchair - manual;Bedside commode Additional Comments: interpreter used.    Prior Function Level of Independence: Independent         Comments: pt reports independence without AD initially, but later husband reports she has 2 canes from prior stroke and has been using them to ambulate in home, reports no falls in last 6 months, reports no assist with ADLs     Hand Dominance   Dominant Hand: Right    Extremity/Trunk Assessment   Upper Extremity Assessment Upper Extremity Assessment: Generalized weakness (prior stroke with R-sided hemiplegia, limited shoulder flex/abd, grip strength mostly intact)    Lower Extremity Assessment Lower Extremity Assessment: Generalized weakness (prior stroke with R-sided hemiplegia, pt reports weakness and numbess in RLE but no noticeable functional difference between L and R)    Cervical / Trunk Assessment Cervical / Trunk Assessment: Kyphotic  Communication   Communication: Prefers language other than English (montagnard- [name], husband speaks [place]  Cognition Arousal/Alertness: Awake/alert Behavior During Therapy: Flat affect Overall Cognitive Status: Difficult to assess                                 General Comments: Pt husband interacting with interpreter more than pt herself, but husband does not report changes in cognition/awareness      General Comments General comments (skin integrity, edema, etc.): interpreter used, [place] [phone], husband speaks vi[place]namese, translated to wife        Assessment/Plan    PT Assessment Patient needs continued PT services  PT Problem List Decreased strength;Decreased mobility;Decreased safety awareness;Decreased activity tolerance;Decreased balance;Pain       PT Treatment Interventions DME instruction;Gait  training;Stair training;Functional mobility training;Therapeutic activities;Patient/family education;Balance training;Therapeutic exercise    PT Goals (Current goals can be found in the Care Plan section)  Acute Rehab PT Goals Patient Stated Goal: return home PT Goal Formulation: With patient/family Time For Goal Achievement: 07/16/20 Potential to Achieve Goals: Good    Frequency Min 3X/week    AM-PAC PT "6 Clicks" Mobility  Outcome Measure Help needed turning from your back to your side while in a flat bed without using bedrails?: A Little Help needed moving from lying on your back to sitting on the side of a flat bed without using bedrails?: A Little Help needed moving to and from a bed to a chair (including a wheelchair)?: A Little Help needed standing up from a chair using your arms (e.g., wheelchair or bedside chair)?: A Little Help needed to walk in hospital room?: A Little Help needed climbing 3-5 steps with a railing? : A Lot 6 Click Score: 17    End of Session Equipment Utilized During Treatment: Gait belt Activity Tolerance: Patient tolerated treatment well Patient left: in chair;with family/visitor present;with call bell/phone within reach Nurse Communication: Mobility status PT Visit Diagnosis: Unsteadiness on feet (R26.81);Difficulty in walking, not elsewhere classified (R26.2);Muscle weakness (generalized) (M62.81);Hemiplegia and hemiparesis Hemiplegia - Right/Left: Right Hemiplegia - caused by: Unspecified    Time: 9702-6378 PT Time Calculation (min) (ACUTE ONLY): 50 min   Charges:   PT Evaluation $PT Eval Moderate Complexity: 1 Mod PT Treatments $Gait Training:  8-22 mins $Therapeutic Activity: 8-22 mins        Rolm Baptise, PT, DPT   Acute Rehabilitation Department Pager #: [phone]  Gaetana Michaelis 07/02/2020, 9:44 AM

## 2020-07-02 NOTE — TOC Initial Note (Signed)
Transition of Care (TOC) - Initial/Assessment Note  Marvetta Gibbons RN, BSN Transitions of Care Unit 4E- RN Case Manager See Treatment Team for direct phone #    Patient Details  Name: Samantha Paul MRN: [id number] Date of Birth: 1946/02/10  Transition of Care [facility]) CM/SW Contact:    Dawayne Patricia, RN Phone Number: 07/02/2020, 2:37 PM  Clinical Narrative:                 Pt admitted with sepsis- s/p lap chole for gangrenous cholecystitis on 7/6. Per PT eval recs for HHPT- order pending per MD.  Damaris Schooner with pt/spouse at bedside through [facility] as pt does not speak english- had interpreter explain [place] and confirmed pt was agreeable- per spouse pt has had [place] services after rehab when pt had stroke- choice offered and spouse states no preference but is ok to use same agency as before if possible. Per spouse- patient has RW, cane, w/c and tub bench at home.  Need to confirm address- per son Lexii Walsh- [phone]) not sure if pt is going to go back to his home- through interpreter spouse does not want to return to address that is in [place]- and wants to speak with some other family and friends to see if they can stay with them. Spouse to give confirmed address as to where they will be staying prior to discharge.  Per d/c records from [place] rehab- pt was set up with [facility]- call made to [place] with [facility] to see if they could provide needed services on this discharge- [facility] has accepted referral pending [place] orders for HHPT.  TOC to continue to follow for transition of care needs. Pt will be new to Eliquis- will need to fill first month here for free- pt will have higher copay until deductible is met ($147)  Expected Discharge Plan: [place] Barriers to Discharge: Continued Medical Work up   Patient Goals and CMS Choice Patient states their goals for this hospitalization and ongoing recovery are:: return home CMS Medicare.gov Compare Post Acute Care list provided to::  Patient Choice offered to / list presented to : Spouse  Expected Discharge Plan and Services Expected Discharge Plan: [place]   Discharge Planning Services: CM Consult Post Acute Care Choice: [place] arrangements for the past 2 months: Single Family Home                           HH Arranged: PT [place]: [facility] [employer] Date [place]: 07/02/20 Time [place]: 37 Representative spoke with at [place]: [place]  Prior Living Arrangements/Services Living arrangements for the past 2 months: [place] Lives with:: Spouse Patient language and need for interpreter reviewed:: Yes Do you feel safe going back to the place where you live?: Yes      Need for Family Participation in Patient Care: Yes (Comment) Care giver support system in place?: Yes (comment) Current home services: DME (RW, quad cane, w/c, tub bench) Criminal Activity/Legal Involvement Pertinent to Current Situation/Hospitalization: No - Comment as needed  Activities of Daily Living      Permission Sought/Granted Permission sought to share information with : Facility Art therapist granted to share information with : Yes, Verbal Permission Granted     Permission granted to share info w AGENCY: [place] agency        Emotional Assessment Appearance::  Appears stated age Attitude/Demeanor/Rapport: Engaged Affect (typically observed): Appropriate Orientation: : Oriented to Self, Oriented to Place, Oriented to  Time, Oriented to Situation Alcohol / Substance Use: Not Applicable Psych Involvement: No (comment)  Admission diagnosis:  Cholecystitis [K81.9] Hyperglycemia [R73.9] Renal insufficiency [N28.9] SIRS (systemic inflammatory response syndrome) ([place]) [R65.10] Sepsis ([place]) [A41.9] Patient Active Problem List   Diagnosis Date Noted  . Sepsis ([place]) 06/29/2020  . DKA (diabetic ketoacidoses) ([place])  06/29/2020  . ARF (acute renal failure) ([place]) 06/29/2020  . Hemiparesis affecting right side as late effect of stroke ([place])   . Acute blood loss anemia   . Left pontine stroke ([place]) 07/13/2018  . Essential hypertension   . Diabetes mellitus type 2 in nonobese (HCC)   . CVA (cerebral vascular accident) ([place]) 07/10/2018  . Hypertensive urgency 07/10/2018  . Hyperglycemia 07/10/2018  . Right sided weakness 07/10/2018  . Hyperlipidemia    PCP:  Elwyn Reach, MD Pharmacy:   [facility] [place], [address] [place] [address] Phone: [phone] Fax: [phone]     Social Determinants of Health (SDOH) Interventions    Readmission Risk Interventions No flowsheet data found.

## 2020-07-03 ENCOUNTER — Inpatient Hospital Stay (HOSPITAL_COMMUNITY): Payer: Medicare Other

## 2020-07-03 DIAGNOSIS — R739 Hyperglycemia, unspecified: Secondary | ICD-10-CM

## 2020-07-03 DIAGNOSIS — I340 Nonrheumatic mitral (valve) insufficiency: Secondary | ICD-10-CM

## 2020-07-03 DIAGNOSIS — K819 Cholecystitis, unspecified: Secondary | ICD-10-CM

## 2020-07-03 DIAGNOSIS — I350 Nonrheumatic aortic (valve) stenosis: Secondary | ICD-10-CM

## 2020-07-03 DIAGNOSIS — I361 Nonrheumatic tricuspid (valve) insufficiency: Secondary | ICD-10-CM

## 2020-07-03 LAB — CBC
HCT: 33.4 % — ABNORMAL LOW (ref 36.0–46.0)
Hemoglobin: 10.1 g/dL — ABNORMAL LOW (ref 12.0–15.0)
MCH: 21.2 pg — ABNORMAL LOW (ref 26.0–34.0)
MCHC: 30.2 g/dL (ref 30.0–36.0)
MCV: 70 fL — ABNORMAL LOW (ref 80.0–100.0)
Platelets: 313 10*3/uL (ref 150–400)
RBC: 4.77 MIL/uL (ref 3.87–5.11)
RDW: 14.2 % (ref 11.5–15.5)
WBC: 13.3 10*3/uL — ABNORMAL HIGH (ref 4.0–10.5)
nRBC: 0.2 % (ref 0.0–0.2)

## 2020-07-03 LAB — GLUCOSE, CAPILLARY
Glucose-Capillary: 303 mg/dL — ABNORMAL HIGH (ref 70–99)
Glucose-Capillary: 217 mg/dL — ABNORMAL HIGH (ref 70–99)

## 2020-07-03 LAB — COMPREHENSIVE METABOLIC PANEL
ALT: 18 U/L (ref 0–44)
AST: 15 U/L (ref 15–41)
Albumin: 1.8 g/dL — ABNORMAL LOW (ref 3.5–5.0)
Alkaline Phosphatase: 93 U/L (ref 38–126)
Anion gap: 8 (ref 5–15)
BUN: 15 mg/dL (ref 8–23)
CO2: 18 mmol/L — ABNORMAL LOW (ref 22–32)
Calcium: 7.7 mg/dL — ABNORMAL LOW (ref 8.9–10.3)
Chloride: 108 mmol/L (ref 98–111)
Creatinine, Ser: 0.98 mg/dL (ref 0.44–1.00)
GFR calc Af Amer: >60 mL/min (ref >60)
GFR calc non Af Amer: 58 mL/min — ABNORMAL LOW (ref >60)
Glucose, Bld: 335 mg/dL — ABNORMAL HIGH (ref 70–99)
Potassium: 3.6 mmol/L (ref 3.5–5.1)
Sodium: 134 mmol/L — ABNORMAL LOW (ref 135–145)
Total Bilirubin: 0.6 mg/dL (ref 0.3–1.2)
Total Protein: 5 g/dL — ABNORMAL LOW (ref 6.5–8.1)

## 2020-07-03 LAB — T3: T3, Total: 45 ng/dL — ABNORMAL LOW (ref 71–180)

## 2020-07-03 LAB — HEPARIN LEVEL (UNFRACTIONATED): Heparin Unfractionated: 0.44 IU/mL (ref 0.30–0.70)

## 2020-07-03 LAB — ECHOCARDIOGRAM COMPLETE
Height: 58 in
Weight: 2880 oz

## 2020-07-03 MED ORDER — METOPROLOL TARTRATE 25 MG PO TABS: 12.5000 mg | ORAL_TABLET | Freq: Two times a day (BID) | ORAL | 2 refills | Status: DC

## 2020-07-03 MED ORDER — METFORMIN HCL 500 MG PO TABS: 500.0000 mg | ORAL_TABLET | Freq: Two times a day (BID) | ORAL | 11 refills | Status: AC

## 2020-07-03 MED ORDER — PEN NEEDLES 32G X 4 MM MISC: 11 refills | Status: DC

## 2020-07-03 MED ORDER — APIXABAN 5 MG PO TABS: 5.0000 mg | ORAL_TABLET | Freq: Two times a day (BID) | ORAL | 3 refills | Status: DC

## 2020-07-03 MED ORDER — OXYCODONE HCL 5 MG PO TABS: 5.0000 mg | ORAL_TABLET | Freq: Four times a day (QID) | ORAL | 0 refills | Status: DC | PRN

## 2020-07-03 MED ORDER — INSULIN GLARGINE 100 UNIT/ML SOLOSTAR PEN
20.0000 [IU] | PEN_INJECTOR | Freq: Every day | SUBCUTANEOUS | 11 refills | Status: DC
Start: 2020-07-03 — End: 2020-08-23

## 2020-07-03 MED ORDER — DILTIAZEM HCL ER COATED BEADS 240 MG PO CP24
240.0000 mg | ORAL_CAPSULE | Freq: Every day | ORAL | 11 refills | Status: DC
Start: 2020-07-03 — End: 2020-08-23

## 2020-07-03 MED ORDER — ACETAMINOPHEN 325 MG PO TABS: 650.0000 mg | ORAL_TABLET | Freq: Four times a day (QID) | ORAL | Status: DC | PRN

## 2020-07-03 MED FILL — ELIQUIS 5 MG TABLET: 5 | 30 days supply | Qty: 60 | Fill #0

## 2020-07-03 MED FILL — CARTIA XT 240 MG CAPSULE: 240 | 30 days supply | Qty: 30 | Fill #0

## 2020-07-03 MED FILL — LANTUS SOLOSTAR 100 UNITS/M: 100 | 30 days supply | Qty: 6 | Fill #0

## 2020-07-03 MED FILL — PENTIPS 32G X 4 MM MISC: 32G X 4 MM | 30 days supply | Qty: 100 | Fill #0

## 2020-07-03 MED FILL — metFORMIN HCL 500 MG TABS: 500 | 30 days supply | Qty: 60 | Fill #0

## 2020-07-03 MED FILL — METOPROLOL TARTRATE 25 MG T: 25 | 60 days supply | Qty: 60 | Fill #0

## 2020-07-03 NOTE — Progress Notes (Signed)
3 Days Post-Op   Subjective/Chief Complaint: Eating well. Some incisional soreness around the umbilicus but pain controlled. Her husband is asking when she can go home.   Objective: Vital signs in last 24 hours: Temp:  [97.6 F (36.4 C)-97.8 F (36.6 C)] 97.7 F (36.5 C) (07/09 0816) Pulse Rate:  [65-91] 70 (07/09 0816) Resp:  [15-20] 20 (07/09 0816) BP: (107-133)/(55-91) 120/70 (07/09 0816) SpO2:  [97 %-99 %] 99 % (07/09 0816) Last BM Date: 06/30/20  Intake/Output from previous day: 07/08 0701 - 07/09 0700 In: 1380.8 [P.O.:240; I.V.:251.5; IV Piggyback:889.3] Out: 1970 [Urine:1850; Drains:120] Intake/Output this shift: No intake/output data recorded.  General appearance: cooperative Cardio: irregularly irregular rhythm GI: soft, incisions CDI, JP ss - removed at bedside without issue.   Lab Results:  Recent Labs    07/02/20 0950 07/03/20 0344  WBC 15.4* 13.3*  HGB 10.8* 10.1*  HCT 34.9* 33.4*  PLT 242 313   BMET Recent Labs    07/02/20 1825 07/03/20 0344  NA 134* 134*  K 4.0 3.6  CL 108 108  CO2 17* 18*  GLUCOSE 306* 335*  BUN 22 15  CREATININE 1.10* 0.98  CALCIUM 7.7* 7.7*   PT/INR No results for input(s): LABPROT, INR in the last 72 hours. ABG No results for input(s): PHART, HCO3 in the last 72 hours.  Invalid input(s): PCO2, PO2  Studies/Results: No results found.  Anti-infectives: Anti-infectives (From admission, onward)   Start     Dose/Rate Route Frequency Ordered Stop   07/01/20 2200  ceFEPIme (MAXIPIME) 2 g in sodium chloride 0.9 % 100 mL IVPB  Status:  Discontinued        2 g 200 mL/hr over 30 Minutes Intravenous Every 12 hours 07/01/20 1005 07/01/20 1049   07/01/20 1800  cefTRIAXone (ROCEPHIN) 2 g in sodium chloride 0.9 % 100 mL IVPB     Discontinue     2 g 200 mL/hr over 30 Minutes Intravenous Every 24 hours 07/01/20 1049     06/30/20 0800  ceFEPIme (MAXIPIME) 2 g in sodium chloride 0.9 % 100 mL IVPB  Status:  Discontinued        2  g 200 mL/hr over 30 Minutes Intravenous Every 24 hours 06/29/20 0553 07/01/20 1005   06/29/20 1400  metroNIDAZOLE (FLAGYL) IVPB 500 mg     Discontinue     500 mg 100 mL/hr over 60 Minutes Intravenous Every 8 hours 06/29/20 0553     06/29/20 0315  ceFEPIme (MAXIPIME) 2 g in sodium chloride 0.9 % 100 mL IVPB        2 g 200 mL/hr over 30 Minutes Intravenous  Once 06/29/20 0301 06/29/20 0603   06/29/20 0315  metroNIDAZOLE (FLAGYL) IVPB 500 mg        500 mg 100 mL/hr over 60 Minutes Intravenous  Once 06/29/20 0301 06/29/20 0620      Assessment/Plan: POD 3, s/p lap chole for gangrenous cholecystitis, Dr. Janee Morn, 06/30/20 - Afebrile, WBC 13 from 15 - Doing well surgically, drain removed  - No further abx necessary for cholecystitis  - Stable for discharge from a surgical perspective. Follow up and pain Rx provided.   AF RVR - per primary. On Eliquis    LOS: 4 days    Adam Phenix 07/03/2020

## 2020-07-03 NOTE — Progress Notes (Signed)
Physical Therapy Treatment Patient Details Name: Samantha Paul MRN: [id number] DOB: 28-Jan-1946 Today's Date: 07/03/2020    History of Present Illness Pt is a 74 yo female presenting with abdominal pain x3 days, workup revealed sepsis from acute cholecystitis. Upon admission, pt also found to have DKA with acute renal failure. PMH includes: stroke with R-sided hemiplegia, HTN, and DM II.    PT Comments    Pt very pleasant with spouse present to assist with interpreting throughout session. Pt able to state need to void and assisted to bathroom with pt able to perform pericare. Pt with improved mobility able to walk in hall this session with cane with cane adjusted as it was much too high. Pt reports feeling stronger after days of not eating. Pt with decreased activity tolerance but slowly improving from prior session. Pt encouraged to walk to bathroom with nursing staff.     Follow Up Recommendations  Home health PT;Supervision for mobility/OOB     Equipment Recommendations  None recommended by PT    Recommendations for Other Services       Precautions / Restrictions Precautions Precautions: Fall Restrictions Other Position/Activity Restrictions: pt speaks Samantha Paul, husband present to translate    Mobility  Bed Mobility Overal bed mobility: Modified Independent       Supine to sit: Modified independent (Device/Increase time)     General bed mobility comments: pt able to transition from supine to EOB without physical assist  Transfers Overall transfer level: Needs assistance   Transfers: Sit to/from Stand Sit to Stand: Min guard         General transfer comment: guarding to stand from bed and to/from toilet with railing  Ambulation/Gait Ambulation/Gait assistance: Min guard Gait Distance (Feet): 100 Feet Assistive device: Straight cane Gait Pattern/deviations: Step-through pattern;Decreased stride length;Trunk flexed   Gait velocity interpretation: 1.31 - 2.62  ft/sec, indicative of limited community ambulator General Gait Details: pt able to walk with LUE on cane with guarding for lines and safety. PT walked 15' then 22' with no noted weakness   Stairs             Wheelchair Mobility    Modified Rankin (Stroke Patients Only)       Balance Overall balance assessment: No apparent balance deficits (not formally assessed)                                          Cognition Arousal/Alertness: Awake/alert Behavior During Therapy: WFL for tasks assessed/performed Overall Cognitive Status: Difficult to assess                                 General Comments: spouse assisting with interpreting but does not relay all questions/ statements      Exercises General Exercises - Lower Extremity Long Arc Quad: AROM;Both;Seated;15 reps Hip Flexion/Marching: AROM;Both;Seated;15 reps    General Comments        Pertinent Vitals/Pain Pain Assessment: No/denies pain    Home Living                      Prior Function            PT Goals (current goals can now be found in the care plan section) Progress towards PT goals: Progressing toward goals    Frequency    Min  3X/week      PT Plan Current plan remains appropriate    Co-evaluation              AM-PAC PT "6 Clicks" Mobility   Outcome Measure  Help needed turning from your back to your side while in a flat bed without using bedrails?: None Help needed moving from lying on your back to sitting on the side of a flat bed without using bedrails?: None Help needed moving to and from a bed to a chair (including a wheelchair)?: A Little Help needed standing up from a chair using your arms (e.g., wheelchair or bedside chair)?: A Little Help needed to walk in hospital room?: A Little Help needed climbing 3-5 steps with a railing? : A Little 6 Click Score: 20    End of Session   Activity Tolerance: Patient tolerated treatment  well Patient left: in chair;with family/visitor present;with call bell/phone within reach Nurse Communication: Mobility status PT Visit Diagnosis: Unsteadiness on feet (R26.81);Difficulty in walking, not elsewhere classified (R26.2);Muscle weakness (generalized) (M62.81)     Time: 2774-1287 PT Time Calculation (min) (ACUTE ONLY): 25 min  Charges:  $Gait Training: 8-22 mins $Therapeutic Exercise: 8-22 mins                     Samantha Paul P, PT Acute Rehabilitation Services Pager: [phone] Office: [phone]    Ephrem Carrick B Melvine Julin 07/03/2020, 1:16 PM

## 2020-07-03 NOTE — Progress Notes (Addendum)
Diabetes coordinator paged about patient going home on insulin right before her discharge. Spoke with staff RN who said the interpreter was available now. Diabetes coordinator not available at this time. Was not aware earlier today that patient would be going home today.   Spoke again with staff RN who stated that she was working with patient and interpreter with a demo insulin pen. Asked case manager to run benefit check on Lantus Solostar insulin pen and she was able to find out that the patient's copay would be $35/month.  Smith Mince RN BSN CDE Diabetes Coordinator Pager: [phone]  8am-5pm

## 2020-07-03 NOTE — Progress Notes (Signed)
ANTICOAGULATION CONSULT NOTE - Follow Up Consult  Pharmacy Consult for Heparin > apixaban Indication: atrial fibrillation  Allergies  Allergen Reactions  . Metformin And Related     Abdominal pain and diarrhea    Patient Measurements: Height: 4\' 10"  (147.3 cm) Weight: 81.6 kg (180 lb) IBW/kg (Calculated) : 40.9 Heparin Dosing Weight: 60.3  Vital Signs: Temp: 97.8 F (36.6 C) (07/09 0321) Temp Source: Oral (07/09 0321) BP: 128/91 (07/09 0321) Pulse Rate: 65 (07/09 0321)  Labs: Recent Labs    07/01/20 0556 07/01/20 0556 07/01/20 1840 07/02/20 0950 07/02/20 1825 07/03/20 0344  HGB 10.0*   < >  --  10.8*  --  10.1*  HCT 33.1*  --   --  34.9*  --  33.4*  PLT 165  --   --  242  --  313  HEPARINUNFRC  --   --  <0.10* 0.36  --  0.44  CREATININE 1.02*  --   --   --  1.10* 0.98   < > = values in this interval not displayed.    Estimated Creatinine Clearance: 47.5 mL/min (by C-G formula based on SCr of 0.98 mg/dL).   Medical History: Past Medical History:  Diagnosis Date  . Back pain   . HTN (hypertension)   . Knee pain   . Stroke [facility])     Assessment: 73yof admitted with acute cholecystitis s/p lap chole, found to have new Afib RVR > CVR on diltiazem drip.  Heparin drip 1100 uts/hr HL 0.4 at goal.  CBC stable no bleeding Continue heparin drip this am until apixiban dose given then stop  Start today apixaban 5mg  BID this morning Dose appropriate Cr < 1.5 wt > 60kg, age < 80  Goal of Therapy:  Heparin level 0.3-0.7 units/ml Monitor platelets by anticoagulation protocol: Yes   Plan:  Heparin drip 1100 uts/hr - stop at 10 am after apixaban dose given   start apixaban 5mg  BID  [facility] Pharm.D. CPP, BCPS Clinical Pharmacist [phone] 07/03/2020 7:38 AM

## 2020-07-03 NOTE — TOC Benefit Eligibility Note (Signed)
Transition of Care [facility]) Benefit Eligibility Note    Patient Details  Name: Samantha Paul MRN: [id number] Date of Birth: 04-30-1946   Medication/Dose: LANTUS  20 UNITS DAILY  Covered?: Yes  Tier: 3 Drug  Prescription Coverage Preferred Pharmacy: [facility] with Person/Company/Phone Number:: EMMA  @  [employer] # [phone]  Co-Pay: $47.00  FOR PEN OR VIAL  Prior Approval: No  Deductible: Unmet       Mardene Sayer Phone Number: 07/03/2020, 4:06 PM

## 2020-07-03 NOTE — Progress Notes (Signed)
Shortly before discharge RN was informed patient would be discharged home on insulin. Diabetes Coordinator was not available at the time but RN obtained a demo insulin pen to educate husband and son as patient would not be able to do herself. Interpreter was at bedside during discharge education. RN in depth explained patient's new medication, medications to be discontinued and the schedule for each medication. RN demonstrated how to use insulin with husband who then demonstrated correctly back to the RN 5x how to use the insulin pen. RN then changed the patient's right lower abdominal wound dressing making sure the husband was aware of how the dress it with a few 4x4 gauze and tape.    After demos and education was completed RN helped patient get dressed, d/c'd IVs, all belongings were packed up including discharge packets and medications delivered by TOC. Patient taken downstairs in wheelchair by RN and was safely put in private vehicle by son and husband.   Lin Landsman RN

## 2020-07-03 NOTE — Progress Notes (Signed)
  Echocardiogram 2D Echocardiogram has been performed.  Samantha Paul 07/03/2020, 12:39 PM

## 2020-07-03 NOTE — Discharge Summary (Addendum)
Physician Discharge Summary  Samantha Paul ZCH:[id number] DOB: 10/16/1946 DOA: 06/29/2020  PCP: Samantha Reach, MD  Admit date: 06/29/2020 Discharge date: 07/03/2020  Time spent: 50  minutes  Recommendations for Outpatient Follow-up:  1. Follow-up surgery in 1 to 2 weeks 2. Follow-up PCP in 1 week 3. Follow result of echocardiogram as outpatient   Discharge Diagnoses:  Principal Problem:   Sepsis ([place]) Active Problems:   Left pontine stroke ([place])   Essential hypertension   DKA (diabetic ketoacidoses) ([place])   ARF (acute renal failure) ([place])   Discharge Condition: Stable  Diet recommendation: Carb modified diet  Filed Weights   06/29/20 0055  Weight: 81.6 kg    History of present illness:  [name] is a 74 y.o. F with obesity, HTN, stroke with residual mild R sided weakness, and DM who presented with few days progressive abdominal pain in RUQ and vomiting.  In the ER, febrile, tachcyardic to 120s, leukocytosis.  CT showed acute cholecystitis.    Hospital Course:   Acute cholecystitis-s/p laparoscopic cholecystectomy by Dr. Grandville Silos on 06/30/2020.  She was started on ceftriaxone and Flagyl.  At this time general surgery has recommended discharge.  No antibiotics recommended at discharge.  Patient is tolerating her diet well.  New onset paroxysmal atrial fibrillation-patient was found to be in new onset atrial fibrillation, CHA2DS2VASc score is 6.  Patient started on anticoagulation with Eliquis 5 mg twice a day.  Also started on Cardizem 240 mg daily, metoprolol 12.5 mg p.o. twice daily.  Echocardiogram has been done.  Result is currently pending.  Result can be followed as outpatient by PCP.  Diabetes mellitus type 2-uncontrolled, hemoglobin A1c in the hospital is 12.7.  Patient is on Amaryl 1 mg p.o. daily.  We will discontinue Amaryl and switch her to Lantus 20 units subcu daily, Metformin 500 mg p.o. twice daily.  Patient will follow up with PCP in 1 week.  Hypertension-blood  pressure is controlled on Cardizem 240 mg daily, reduced dose of metoprolol 12.5 mg p.o. twice daily.  [place]-continue metoprolol.  Abnormal TSH-likely sick euthyroid syndrome, free T4 normal.  History of stroke-patient has history of stroke with mild right-sided weakness.  Will discontinue Plavix as she has been started on Eliquis as above.  Discussed with neurology, Dr. Leonel Ramsay.    Procedures:  Laparoscopic cholecystectomy  Consultations:  General surgery  Discharge Exam: Vitals:   07/03/20 0816 07/03/20 1225  BP: 120/70 123/83  Pulse: 70 72  Resp: 20 18  Temp: 97.7 F (36.5 C) (!) 97.5 F (36.4 C)  SpO2: 99% 99%    General: Appears in no acute distress Cardiovascular: S1-S2, regular, no murmur auscultated Respiratory: Clear to auscultation bilaterally  Discharge Instructions   Discharge Instructions    Change dressing (specify)   Complete by: As directed    Dressing change: daily   Diet - low sodium heart healthy   Complete by: As directed    Increase activity slowly   Complete by: As directed      Allergies as of 07/03/2020      Reactions   Metformin And Related    Abdominal pain and diarrhea      Medication List    STOP taking these medications   amLODipine 10 MG tablet Commonly known as: NORVASC   clopidogrel 75 MG tablet Commonly known as: PLAVIX   glimepiride 1 MG tablet Commonly known as: AMARYL   losartan 50 MG tablet Commonly known as: COZAAR     TAKE these medications  acetaminophen 325 MG tablet Commonly known as: TYLENOL Take 2-3 tablets (650-975 mg total) by mouth every 6 (six) hours as needed for mild pain. What changed:   how much to take  when to take this   apixaban 5 MG Tabs tablet Commonly known as: ELIQUIS Take 1 tablet (5 mg total) by mouth 2 (two) times daily.   blood glucose meter kit and supplies Kit Dispense based on patient and insurance preference. Use up to four times daily as directed. (FOR  ICD-10--E11.9).   diltiazem 240 MG 24 hr capsule Commonly known as: Cartia XT Take 1 capsule (240 mg total) by mouth daily.   insulin glargine 100 UNIT/ML Solostar Pen Commonly known as: LANTUS Inject 20 Units into the skin daily.   metFORMIN 500 MG tablet Commonly known as: Glucophage Take 1 tablet (500 mg total) by mouth 2 (two) times daily with a meal.   metoprolol tartrate 25 MG tablet Commonly known as: LOPRESSOR Take 0.5 tablets (12.5 mg total) by mouth 2 (two) times daily. What changed: how much to take   oxyCODONE 5 MG immediate release tablet Commonly known as: Oxy IR/ROXICODONE Take 1 tablet (5 mg total) by mouth every 6 (six) hours as needed for moderate pain or severe pain (pain not releived by tylenol or ibuprofen).   rosuvastatin 20 MG tablet Commonly known as: CRESTOR TAKE 1 TABLET(20 MG) BY MOUTH DAILY   senna-docusate 8.6-50 MG tablet Commonly known as: Senokot-S Take 2 tablets by mouth at bedtime.            Discharge Care Instructions  (From admission, onward)         Start     Ordered   07/03/20 0000  Change dressing (specify)       Comments: Dressing change: daily   07/03/20 1401         Allergies  Allergen Reactions  . Metformin And Related     Abdominal pain and diarrhea    Follow-up Information    [facility] Follow [facility].   Specialty: [place] Why: HHPT arranged- they will call you to set up home visits Contact information: [place] [address] [phone]        [facility] [place] Follow up.   Specialty: General Surgery Why: Our office is scheduling you for post-operative follow up. Please call to confirm appointment date/time. Contact information: [address] [place] [phone]               The results of significant diagnostics from this hospitalization (including imaging, microbiology, ancillary and laboratory)  are listed below for reference.    Significant Diagnostic Studies: DG Chest 1 View  Result Date: 06/29/2020 CLINICAL DATA:  74 year old female with history of sepsis. EXAM: CHEST  1 VIEW COMPARISON:  Chest x-ray 07/10/2018. FINDINGS: Mild elevation of the right hemidiaphragm. Lung volumes are normal. No consolidative airspace disease. No pleural effusions. No pneumothorax. No evidence of pulmonary edema. Mild cardiomegaly, similar to the prior examination. Upper mediastinal contours are within normal limits. IMPRESSION: 1. No radiographic evidence of acute cardiopulmonary disease. 2. Mild cardiomegaly. 3. Mild elevation of the right hemidiaphragm. Electronically Signed   By: Vinnie Langton M.D.   On: 06/29/2020 04:46   US Abdomen Limited  Result Date: 06/29/2020 CLINICAL DATA:  74 year old female with severe abdominal pain for the past 3 days. Possible cholecystitis and noted on recent CT examination. EXAM: ULTRASOUND ABDOMEN LIMITED  RIGHT UPPER QUADRANT COMPARISON:  No prior abdominal ultrasound. CT the abdomen and pelvis 06/29/2020. FINDINGS: Gallbladder: Large echogenic structure with posterior acoustic shadowing measuring 4.2 cm in diameter in the neck of the gallbladder. Gallbladder is moderately distended and there are low-level internal echoes suggesting biliary sludge. Gallbladder wall appears thickened and edematous measuring 8 mm. Trace amount of pericholecystic fluid. Per report from the sonographer, the patient did not exhibit a sonographic Murphy's sign on examination. Common bile duct: Diameter: 4 mm Liver: No focal lesion identified. Within normal limits in parenchymal echogenicity. Portal vein is patent on color Doppler imaging with normal direction of blood flow towards the liver. Other: None. IMPRESSION: 1. Study is positive for cholelithiasis and biliary sludge with a distended gallbladder with thickened edematous wall. Per report from the sonographer, the patient did not exhibit a  sonographic Murphy's sign on examination, which could be related to recent administration of pain medication. Otherwise, imaging findings are highly concerning for acute cholecystitis. Surgical consultation is recommended. Electronically Signed   By: Vinnie Langton M.D.   On: 06/29/2020 06:36   CT Renal Stone Study  Result Date: 06/29/2020 CLINICAL DATA:  74 year old female with history of elevated white blood cell count. Bilateral flank pain. Nausea and vomiting. EXAM: CT ABDOMEN AND PELVIS WITHOUT CONTRAST TECHNIQUE: Multidetector CT imaging of the abdomen and pelvis was performed following the standard protocol without IV contrast. COMPARISON:  No priors. FINDINGS: Lower chest: Calcifications of the mitral annulus. Hepatobiliary: No definite cystic or solid hepatic lesions are confidently identified on today's noncontrast CT examination. Large calcified gallstone measuring approximately 2.8 x 4.5 x 2.2 cm located in the gallbladder neck. Gallbladder appears moderately distended, with gallbladder wall thickening and haziness and trace volume of fluid adjacent to the gallbladder, concerning for acute cholecystitis. Pancreas: No definite pancreatic mass or peripancreatic fluid collections or inflammatory changes are noted on today's noncontrast CT examination. Spleen: Unremarkable. Adrenals/Urinary Tract: Low-attenuation lesions in the right kidney, incompletely characterized on today's non-contrast CT examination, but statistically likely to represent cysts, measuring up to 5.1 cm in the upper pole. Unenhanced appearance of the left kidney and bilateral adrenal glands is normal. No hydroureteronephrosis. Urinary bladder is normal in appearance. Stomach/Bowel: Unenhanced appearance of the stomach is normal. There is no pathologic dilatation of small bowel or colon. Normal appendix. Vascular/Lymphatic: Aortic atherosclerosis. No lymphadenopathy noted in the abdomen or pelvis. Reproductive: Uterus and ovaries are  atrophic. Other: No significant volume of ascites.  No pneumoperitoneum. Musculoskeletal: There are no aggressive appearing lytic or blastic lesions noted in the visualized portions of the skeleton. IMPRESSION: 1. Findings are highly concerning for acute cholecystitis, as detailed above. Surgical consultation is recommended. 2. Aortic atherosclerosis. Electronically Signed   By: Vinnie Langton M.D.   On: 06/29/2020 04:41    Microbiology: Recent Results (from the past 240 hour(s))  Blood culture (routine x 2)     Status: None (Preliminary result)   Collection Time: 06/29/20  3:50 AM   Specimen: BLOOD RIGHT ARM  Result Value Ref Range Status   Specimen Description BLOOD RIGHT ARM  Final   Special Requests   Final    BOTTLES DRAWN AEROBIC AND ANAEROBIC Blood Culture results may not be optimal due to an inadequate volume of blood received in culture bottles   Culture   Final    NO GROWTH 4 DAYS Performed at [facility], [place] [address], [place], [place] [address]    Report Status PENDING  Incomplete  SARS Coronavirus  2 by RT PCR (hospital order, performed in [facility] hospital lab) Nasopharyngeal Nasopharyngeal Swab     Status: None   Collection Time: 06/29/20  5:00 AM   Specimen: Nasopharyngeal Swab  Result Value Ref Range Status   SARS Coronavirus 2 NEGATIVE NEGATIVE Final    Comment: (NOTE) SARS-CoV-2 target nucleic acids are NOT DETECTED.  The SARS-CoV-2 RNA is generally detectable in upper and lower respiratory specimens during the acute phase of infection. The lowest concentration of SARS-CoV-2 viral copies this assay can detect is 250 copies / mL. A negative result does not preclude SARS-CoV-2 infection and should not be used as the sole basis for treatment or other patient management decisions.  A negative result may occur with improper specimen collection / handling, submission of specimen other than nasopharyngeal swab, presence of viral mutation(s) within the areas  targeted by this assay, and inadequate number of viral copies (<250 copies / mL). A negative result must be combined with clinical observations, patient history, and epidemiological information.  Fact Sheet for Patients:   StrictlyIdeas.no  Fact Sheet for Healthcare Providers: BankingDealers.co.za  This test is not yet approved or  cleared by the [place] FDA and has been authorized for detection and/or diagnosis of SARS-CoV-2 by FDA under an Emergency Use Authorization (EUA).  This EUA will remain in effect (meaning this test can be used) for the duration of the COVID-19 declaration under Section 564(b)(1) of the Act, 21 U.S.C. section 360bbb-3(b)(1), unless the authorization is terminated or revoked sooner.  Performed at [facility], [place] [address], [place], [place] [address]   Urine culture     Status: None   Collection Time: 06/29/20  8:50 PM   Specimen: In/Out Cath Urine  Result Value Ref Range Status   Specimen Description IN/OUT CATH URINE  Final   Special Requests NONE  Final   Culture   Final    NO GROWTH Performed at [facility], [place] [address], [place], [place] [address]    Report Status 07/01/2020 FINAL  Final     Labs: Basic Metabolic Panel: Recent Labs  Lab 06/29/20 2036 06/30/20 0229 07/01/20 0556 07/02/20 1825 07/03/20 0344  NA 131* 131* 132* 134* 134*  K 3.1* 4.1 4.1 4.0 3.6  CL 101 100 104 108 108  CO2 19* 19* 17* 17* 18*  GLUCOSE 136* 160* 188* 306* 335*  BUN 26* 22 26* 22 15  CREATININE 1.24* 1.19* 1.02* 1.10* 0.98  CALCIUM 8.0* 8.1* 7.9* 7.7* 7.7*  MG  --  1.5* 2.1 1.8  --   PHOS  --  2.3* 3.9  --   --    Liver Function Tests: Recent Labs  Lab 06/29/20 0105 06/30/20 0229 07/01/20 0556 07/02/20 1825 07/03/20 0344  AST 26 23 43* 25 15  ALT _0 ALKPHOS 128* 100 112 109 93  BILITOT 2.3* 1.3* 0.5 0.2* 0.6  PROT 7.1 5.6* 5.3* 5.2* 5.0*  ALBUMIN 2.9* 2.0* 1.9*  1.8* 1.8*   Recent Labs  Lab 06/29/20 0105  LIPASE 16   No results for input(s): AMMONIA in the last 168 hours. CBC: Recent Labs  Lab 06/29/20 0105 06/30/20 0229 07/01/20 0556 07/02/20 0950 07/03/20 0344  WBC 27.3* 19.5* 13.9* 15.4* 13.3*  NEUTROABS  --  16.7* 12.3*  --   --   HGB 13.3 10.8* 10.0* 10.8* 10.1*  HCT 45.2 35.8* 33.1* 34.9* 33.4*  MCV 74.5* 72.8* 71.5* 69.8* 70.0*  PLT 204 136*  165 242 313    CBG: Recent Labs  Lab 07/02/20 1141 07/02/20 1547 07/02/20 2234 07/03/20 0611 07/03/20 1220  GLUCAP 249* 331* 343* 303* 217*       Signed:  Oswald Hillock MD.  [facility] 07/03/2020, 2:05 PM

## 2020-07-03 NOTE — Discharge Instructions (Signed)
[facility] °LAPAROSCOPIC SURGERY: POST OP INSTRUCTIONS °Always review your discharge instruction sheet given to you by the facility where your surgery was performed. °IF YOU HAVE DISABILITY OR FAMILY LEAVE FORMS, YOU MUST BRING THEM TO THE OFFICE FOR PROCESSING.   °DO NOT GIVE THEM TO YOUR DOCTOR. ° °PAIN CONTROL ° °1. First take acetaminophen (Tylenol) AND/or ibuprofen (Advil) to control your pain after surgery.  Follow directions on package.  Taking acetaminophen (Tylenol) and/or ibuprofen (Advil) regularly after surgery will help to control your pain and lower the amount of prescription pain medication you may need.  You should not take more than 3,000 mg (3 grams) of acetaminophen (Tylenol) in 24 hours.  You should not take ibuprofen (Advil), aleve, motrin, naprosyn or other NSAIDS if you have a history of stomach ulcers or chronic kidney disease.  °2. A prescription for pain medication may be given to you upon discharge.  Take your pain medication as prescribed, if you still have uncontrolled pain after taking acetaminophen (Tylenol) or ibuprofen (Advil). °3. Use ice packs to help control pain. °4. If you need a refill on your pain medication, please contact your pharmacy.  They will contact our office to request authorization. Prescriptions will not be filled after 5pm or on week-ends. ° °HOME MEDICATIONS °5. Take your usually prescribed medications unless otherwise directed. ° °DIET °6. You should follow a light diet the first few days after arrival home.  Be sure to include lots of fluids daily. Avoid fatty, fried foods.  ° °CONSTIPATION °7. It is common to experience some constipation after surgery and if you are taking pain medication.  Increasing fluid intake and taking a stool softener (such as Colace) will usually help or prevent this problem from occurring.  A mild laxative (Milk of Magnesia or Miralax) should be taken according to package instructions if there are no bowel  movements after 48 hours. ° °WOUND/INCISION CARE °8. Most patients will experience some swelling and bruising in the area of the incisions.  Ice packs will help.  Swelling and bruising can take several days to resolve.  °9. Unless discharge instructions indicate otherwise, follow guidelines below  °a. STERI-STRIPS - you may remove your outer bandages 48 hours after surgery, and you may shower at that time.  You have steri-strips (small skin tapes) in place directly over the incision.  These strips should be left on the skin for 7-10 days.   °b. DERMABOND/SKIN GLUE - you may shower in 24 hours.  The glue will flake off over the next 2-3 weeks. °10. Any sutures or staples will be removed at the office during your follow-up visit. ° °ACTIVITIES °11. You may resume regular (light) daily activities beginning the next day--such as daily self-care, walking, climbing stairs--gradually increasing activities as tolerated.  You may have sexual intercourse when it is comfortable.  Refrain from any heavy lifting or straining until approved by your doctor. °a. You may drive when you are no longer taking prescription pain medication, you can comfortably wear a seatbelt, and you can safely maneuver your car and apply brakes. ° °FOLLOW-UP °12. You should see your doctor in the office for a follow-up appointment approximately 2-3 weeks after your surgery.  You should have been given your post-op/follow-up appointment when your surgery was scheduled.  If you did not receive a post-op/follow-up appointment, make sure that you call for this appointment within a day or two after you arrive home to insure a convenient appointment time. ° °OTHER   INSTRUCTIONS   WHEN TO CALL YOUR DOCTOR: 1. Fever over 101.0 2. Inability to urinate 3. Continued bleeding from incision. 4. Increased pain, redness, or drainage from the incision. 5. Increasing abdominal pain  The clinic staff is available to answer your questions during regular business  hours.  Please dont hesitate to call and ask to speak to one of the nurses for clinical concerns.  If you have a medical emergency, go to the nearest emergency room or call 911.  A surgeon from [facility] is always on call at the hospital. [address], [place], [place], [place]  [address] ? [address], [place], [place]   [address] [phone] ? [phone] ? FAX [phone] Web site: [email or web address]

## 2020-07-03 NOTE — TOC Transition Note (Signed)
Transition of Care (TOC) - CM/SW Discharge Note Donn Pierini RN, BSN Transitions of Care Unit 4E- RN Case Manager See Treatment Team for direct phone #    Patient Details  Name: Samantha Paul MRN: [id number] Date of Birth: [birth year]  Transition of Care [facility]) CM/SW Contact:  Darrold Span, RN Phone Number: 07/03/2020, 3:51 PM   Clinical Narrative:    Pt stable for transition home today, meds are to be filled by [facility] pharmacy and delivered to room prior to discharge. HH has been arranged with [facility] for HHPT- order has been placed. Call made to Tiffany with [facility] for start of care.  Address confirmed with spouse through interpreter at the bedside as the one in epice to be the correct address that they will be at [address]- phone # is correct- mobile # is son's number.  Family is at bedside with interpreter for d/c instructions along with bedside RN.    Final next level of care: Home w Home Health Services Barriers to Discharge: Barriers Resolved   Patient Goals and CMS Choice Patient states their goals for this hospitalization and ongoing recovery are:: return home CMS Medicare.gov Compare Post Acute Care list provided to:: Patient Choice offered to / list presented to : Spouse  Discharge Placement             [facility]          Discharge Plan and Services   Discharge Planning Services: CM Consult Post Acute Care Choice: Home Health          DME Arranged: N/A DME Agency: NA       HH Arranged: PT HH [facility]: [facility] Date HH Agency Contacted: 07/02/20 Time HH Agency Contacted: 1436 Representative spoke with at [facility] Agency: Tiffany  Social Determinants of Health (SDOH) Interventions     Readmission Risk Interventions Readmission Risk Prevention Plan 07/03/2020 07/03/2020  Post Dischage Appt Complete -  Medication Screening Complete Complete  Transportation Screening Complete Complete

## 2020-07-03 NOTE — Progress Notes (Signed)
Inpatient Diabetes Program Recommendations  AACE/ADA: New Consensus Statement on Inpatient Glycemic Control (2015)  Target Ranges:  Prepandial:   less than 140 mg/dL      Peak postprandial:   less than 180 mg/dL (1-2 hours)      Critically ill patients:  140 - 180 mg/dL   Lab Results  Component Value Date   GLUCAP 303 (H) 07/03/2020   HGBA1C 12.7 (H) 06/29/2020    Review of Glycemic Control  Results for ELIANNA, WINDOM (MRN [id number]) as of 07/03/2020 09:31  Ref. Range 07/02/2020 07:48 07/02/2020 11:41 07/02/2020 15:47 07/02/2020 22:34 07/03/2020 06:11  Glucose-Capillary Latest Ref Range: 70 - 99 mg/dL 272 (H) 536 (H) 644 (H) 343 (H) 303 (H)    Diabetes history: DM 2 Outpatient Diabetes medications: Amaryl 1 mg Daily Current orders for Inpatient glycemic control:  Levemir 6 units bid Novolog 0-6 units Q4 hours  Decadron 5 mg given 7/6  Inpatient Diabetes Program Recommendations:    Fasting glucose 300's. In order to transition her toward discharge, may consider -  Ordering Amaryl 4 mg bid (watch glucose trends for 24 hours after 2 full doses) -  Metformin 500 mg Daily  Pt may need insulin at d/c, however would be better if we can do glucose control with oral medications as she is compliant with her medications at home.  Will need in person education with interpreter with husband for insulin teaching if in plan of care. DM Coordinators work remotely over the weekend.  Thanks,  Christena Deem RN, MSN, BC-ADM Inpatient Diabetes Coordinator Team Pager [phone] (8a-5p)

## 2020-07-04 LAB — CULTURE, BLOOD (ROUTINE X 2): Culture: NO GROWTH

## 2020-07-26 ENCOUNTER — Emergency Department (HOSPITAL_COMMUNITY): Payer: Medicare Other

## 2020-07-26 ENCOUNTER — Encounter (HOSPITAL_COMMUNITY)
Admission: EM | Disposition: A | Discharge status: Skilled Nursing Facility | Payer: Self-pay | Source: Home / Self Care | Attending: Neurology

## 2020-07-26 ENCOUNTER — Inpatient Hospital Stay (HOSPITAL_COMMUNITY): Payer: Medicare Other

## 2020-07-26 ENCOUNTER — Inpatient Hospital Stay (HOSPITAL_COMMUNITY)
Admission: EM | Admit: 2020-07-26 | Discharge: 2020-08-23 | DRG: 023 | Disposition: A | Discharge status: Skilled Nursing Facility | Payer: Medicare Other | Attending: Internal Medicine | Admitting: Internal Medicine

## 2020-07-26 ENCOUNTER — Emergency Department (HOSPITAL_COMMUNITY): Payer: Medicare Other | Admitting: Anesthesiology

## 2020-07-26 ENCOUNTER — Encounter (HOSPITAL_COMMUNITY): Payer: Self-pay | Admitting: Radiology

## 2020-07-26 DIAGNOSIS — I63411 Cerebral infarction due to embolism of right middle cerebral artery: Secondary | ICD-10-CM | POA: Diagnosis present

## 2020-07-26 DIAGNOSIS — I482 Chronic atrial fibrillation, unspecified: Secondary | ICD-10-CM | POA: Diagnosis present

## 2020-07-26 DIAGNOSIS — R0682 Tachypnea, not elsewhere classified: Secondary | ICD-10-CM | POA: Diagnosis not present

## 2020-07-26 DIAGNOSIS — H518 Other specified disorders of binocular movement: Secondary | ICD-10-CM | POA: Diagnosis present

## 2020-07-26 DIAGNOSIS — Z7901 Long term (current) use of anticoagulants: Secondary | ICD-10-CM

## 2020-07-26 DIAGNOSIS — N179 Acute kidney failure, unspecified: Secondary | ICD-10-CM | POA: Diagnosis present

## 2020-07-26 DIAGNOSIS — I4891 Unspecified atrial fibrillation: Secondary | ICD-10-CM | POA: Diagnosis not present

## 2020-07-26 DIAGNOSIS — Z4659 Encounter for fitting and adjustment of other gastrointestinal appliance and device: Secondary | ICD-10-CM | POA: Diagnosis not present

## 2020-07-26 DIAGNOSIS — Y95 Nosocomial condition: Secondary | ICD-10-CM | POA: Diagnosis not present

## 2020-07-26 DIAGNOSIS — R6520 Severe sepsis without septic shock: Secondary | ICD-10-CM | POA: Diagnosis not present

## 2020-07-26 DIAGNOSIS — I959 Hypotension, unspecified: Secondary | ICD-10-CM | POA: Diagnosis present

## 2020-07-26 DIAGNOSIS — Z9114 Patient's other noncompliance with medication regimen: Secondary | ICD-10-CM | POA: Diagnosis not present

## 2020-07-26 DIAGNOSIS — D509 Iron deficiency anemia, unspecified: Secondary | ICD-10-CM | POA: Diagnosis present

## 2020-07-26 DIAGNOSIS — Z01818 Encounter for other preprocedural examination: Secondary | ICD-10-CM | POA: Diagnosis not present

## 2020-07-26 DIAGNOSIS — E871 Hypo-osmolality and hyponatremia: Secondary | ICD-10-CM | POA: Diagnosis not present

## 2020-07-26 DIAGNOSIS — Z8673 Personal history of transient ischemic attack (TIA), and cerebral infarction without residual deficits: Secondary | ICD-10-CM | POA: Diagnosis not present

## 2020-07-26 DIAGNOSIS — I639 Cerebral infarction, unspecified: Secondary | ICD-10-CM | POA: Diagnosis present

## 2020-07-26 DIAGNOSIS — E1165 Type 2 diabetes mellitus with hyperglycemia: Secondary | ICD-10-CM | POA: Diagnosis not present

## 2020-07-26 DIAGNOSIS — R739 Hyperglycemia, unspecified: Secondary | ICD-10-CM | POA: Diagnosis not present

## 2020-07-26 DIAGNOSIS — D696 Thrombocytopenia, unspecified: Secondary | ICD-10-CM | POA: Diagnosis not present

## 2020-07-26 DIAGNOSIS — Z515 Encounter for palliative care: Secondary | ICD-10-CM

## 2020-07-26 DIAGNOSIS — D62 Acute posthemorrhagic anemia: Secondary | ICD-10-CM | POA: Diagnosis present

## 2020-07-26 DIAGNOSIS — R4701 Aphasia: Secondary | ICD-10-CM | POA: Diagnosis present

## 2020-07-26 DIAGNOSIS — E7849 Other hyperlipidemia: Secondary | ICD-10-CM | POA: Diagnosis not present

## 2020-07-26 DIAGNOSIS — N10 Acute tubulo-interstitial nephritis: Secondary | ICD-10-CM | POA: Diagnosis not present

## 2020-07-26 DIAGNOSIS — I6601 Occlusion and stenosis of right middle cerebral artery: Secondary | ICD-10-CM | POA: Diagnosis present

## 2020-07-26 DIAGNOSIS — R609 Edema, unspecified: Secondary | ICD-10-CM | POA: Diagnosis not present

## 2020-07-26 DIAGNOSIS — G8194 Hemiplegia, unspecified affecting left nondominant side: Secondary | ICD-10-CM | POA: Diagnosis present

## 2020-07-26 DIAGNOSIS — E1151 Type 2 diabetes mellitus with diabetic peripheral angiopathy without gangrene: Secondary | ICD-10-CM | POA: Diagnosis present

## 2020-07-26 DIAGNOSIS — H53462 Homonymous bilateral field defects, left side: Secondary | ICD-10-CM | POA: Diagnosis present

## 2020-07-26 DIAGNOSIS — D539 Nutritional anemia, unspecified: Secondary | ICD-10-CM | POA: Diagnosis not present

## 2020-07-26 DIAGNOSIS — A414 Sepsis due to anaerobes: Secondary | ICD-10-CM | POA: Diagnosis not present

## 2020-07-26 DIAGNOSIS — I69351 Hemiplegia and hemiparesis following cerebral infarction affecting right dominant side: Secondary | ICD-10-CM

## 2020-07-26 DIAGNOSIS — E119 Type 2 diabetes mellitus without complications: Secondary | ICD-10-CM

## 2020-07-26 DIAGNOSIS — J150 Pneumonia due to Klebsiella pneumoniae: Secondary | ICD-10-CM | POA: Diagnosis not present

## 2020-07-26 DIAGNOSIS — J9600 Acute respiratory failure, unspecified whether with hypoxia or hypercapnia: Secondary | ICD-10-CM

## 2020-07-26 DIAGNOSIS — R1310 Dysphagia, unspecified: Secondary | ICD-10-CM | POA: Diagnosis present

## 2020-07-26 DIAGNOSIS — I10 Essential (primary) hypertension: Secondary | ICD-10-CM | POA: Diagnosis present

## 2020-07-26 DIAGNOSIS — G936 Cerebral edema: Secondary | ICD-10-CM | POA: Diagnosis present

## 2020-07-26 DIAGNOSIS — A0472 Enterocolitis due to Clostridium difficile, not specified as recurrent: Secondary | ICD-10-CM

## 2020-07-26 DIAGNOSIS — Z79899 Other long term (current) drug therapy: Secondary | ICD-10-CM

## 2020-07-26 DIAGNOSIS — R233 Spontaneous ecchymoses: Secondary | ICD-10-CM | POA: Diagnosis present

## 2020-07-26 DIAGNOSIS — E878 Other disorders of electrolyte and fluid balance, not elsewhere classified: Secondary | ICD-10-CM | POA: Diagnosis not present

## 2020-07-26 DIAGNOSIS — R29724 NIHSS score 24: Secondary | ICD-10-CM | POA: Diagnosis present

## 2020-07-26 DIAGNOSIS — E11649 Type 2 diabetes mellitus with hypoglycemia without coma: Secondary | ICD-10-CM | POA: Diagnosis present

## 2020-07-26 DIAGNOSIS — R509 Fever, unspecified: Secondary | ICD-10-CM

## 2020-07-26 DIAGNOSIS — Z20822 Contact with and (suspected) exposure to covid-19: Secondary | ICD-10-CM | POA: Diagnosis present

## 2020-07-26 DIAGNOSIS — R531 Weakness: Secondary | ICD-10-CM

## 2020-07-26 DIAGNOSIS — E222 Syndrome of inappropriate secretion of antidiuretic hormone: Secondary | ICD-10-CM | POA: Diagnosis present

## 2020-07-26 DIAGNOSIS — D638 Anemia in other chronic diseases classified elsewhere: Secondary | ICD-10-CM | POA: Diagnosis present

## 2020-07-26 DIAGNOSIS — I6300 Cerebral infarction due to thrombosis of unspecified precerebral artery: Secondary | ICD-10-CM

## 2020-07-26 DIAGNOSIS — I619 Nontraumatic intracerebral hemorrhage, unspecified: Secondary | ICD-10-CM | POA: Diagnosis not present

## 2020-07-26 DIAGNOSIS — J9601 Acute respiratory failure with hypoxia: Secondary | ICD-10-CM | POA: Diagnosis present

## 2020-07-26 DIAGNOSIS — Z7189 Other specified counseling: Secondary | ICD-10-CM | POA: Diagnosis not present

## 2020-07-26 DIAGNOSIS — R414 Neurologic neglect syndrome: Secondary | ICD-10-CM | POA: Diagnosis present

## 2020-07-26 DIAGNOSIS — I82612 Acute embolism and thrombosis of superficial veins of left upper extremity: Secondary | ICD-10-CM | POA: Diagnosis present

## 2020-07-26 DIAGNOSIS — A419 Sepsis, unspecified organism: Secondary | ICD-10-CM | POA: Diagnosis not present

## 2020-07-26 DIAGNOSIS — I69391 Dysphagia following cerebral infarction: Secondary | ICD-10-CM | POA: Diagnosis not present

## 2020-07-26 DIAGNOSIS — D72829 Elevated white blood cell count, unspecified: Secondary | ICD-10-CM

## 2020-07-26 DIAGNOSIS — Z794 Long term (current) use of insulin: Secondary | ICD-10-CM

## 2020-07-26 DIAGNOSIS — E785 Hyperlipidemia, unspecified: Secondary | ICD-10-CM | POA: Diagnosis present

## 2020-07-26 DIAGNOSIS — R130 Aphagia: Secondary | ICD-10-CM | POA: Diagnosis present

## 2020-07-26 DIAGNOSIS — R29810 Facial weakness: Secondary | ICD-10-CM | POA: Diagnosis present

## 2020-07-26 DIAGNOSIS — Z7401 Bed confinement status: Secondary | ICD-10-CM

## 2020-07-26 HISTORY — PX: RADIOLOGY WITH ANESTHESIA: SHX6223

## 2020-07-26 HISTORY — DX: Type 2 diabetes mellitus without complications: E11.9

## 2020-07-26 HISTORY — DX: Cerebral infarction, unspecified: I63.9

## 2020-07-26 HISTORY — PX: IR PERCUTANEOUS ART THROMBECTOMY/INFUSION INTRACRANIAL INC DIAG ANGIO: IMG6087

## 2020-07-26 LAB — CBC
HCT: 37.7 % (ref 36.0–46.0)
Hemoglobin: 10.7 g/dL — ABNORMAL LOW (ref 12.0–15.0)
MCH: 21.3 pg — ABNORMAL LOW (ref 26.0–34.0)
MCHC: 28.4 g/dL — ABNORMAL LOW (ref 30.0–36.0)
MCV: 75.1 fL — ABNORMAL LOW (ref 80.0–100.0)
Platelets: 185 10*3/uL (ref 150–400)
RBC: 5.02 MIL/uL (ref 3.87–5.11)
RDW: 15.8 % — ABNORMAL HIGH (ref 11.5–15.5)
WBC: 6.5 10*3/uL (ref 4.0–10.5)
nRBC: 0 % (ref 0.0–0.2)

## 2020-07-26 LAB — BLOOD GAS, ARTERIAL
Acid-base deficit: 6.6 mmol/L — ABNORMAL HIGH (ref 0.0–2.0)
Bicarbonate: 17.8 mmol/L — ABNORMAL LOW (ref 20.0–28.0)
Drawn by: 21338
FIO2: 60
O2 Saturation: 99.4 %
Patient temperature: 36.6
pCO2 arterial: 31.1 mmHg — ABNORMAL LOW (ref 32.0–48.0)
pH, Arterial: 7.373 (ref 7.350–7.450)
pO2, Arterial: 210 mmHg — ABNORMAL HIGH (ref 83.0–108.0)

## 2020-07-26 LAB — DIFFERENTIAL
Abs Immature Granulocytes: 0.02 10*3/uL (ref 0.00–0.07)
Basophils Absolute: 0 10*3/uL (ref 0.0–0.1)
Basophils Relative: 0 %
Eosinophils Absolute: 0 10*3/uL (ref 0.0–0.5)
Eosinophils Relative: 1 %
Immature Granulocytes: 0 %
Lymphocytes Relative: 30 %
Lymphs Abs: 1.9 10*3/uL (ref 0.7–4.0)
Monocytes Absolute: 0.4 10*3/uL (ref 0.1–1.0)
Monocytes Relative: 6 %
Neutro Abs: 4.1 10*3/uL (ref 1.7–7.7)
Neutrophils Relative %: 63 %

## 2020-07-26 LAB — I-STAT CHEM 8, ED
BUN: 25 mg/dL — ABNORMAL HIGH (ref 8–23)
Calcium, Ion: 1.15 mmol/L (ref 1.15–1.40)
Chloride: 106 mmol/L (ref 98–111)
Creatinine, Ser: 1.1 mg/dL — ABNORMAL HIGH (ref 0.44–1.00)
Glucose, Bld: 198 mg/dL — ABNORMAL HIGH (ref 70–99)
HCT: 38 % (ref 36.0–46.0)
Hemoglobin: 12.9 g/dL (ref 12.0–15.0)
Potassium: 4.5 mmol/L (ref 3.5–5.1)
Sodium: 141 mmol/L (ref 135–145)
TCO2: 21 mmol/L — ABNORMAL LOW (ref 22–32)

## 2020-07-26 LAB — COMPREHENSIVE METABOLIC PANEL
ALT: 16 U/L (ref 0–44)
AST: 20 U/L (ref 15–41)
Albumin: 3.7 g/dL (ref 3.5–5.0)
Alkaline Phosphatase: 90 U/L (ref 38–126)
Anion gap: 12 (ref 5–15)
BUN: 22 mg/dL (ref 8–23)
CO2: 19 mmol/L — ABNORMAL LOW (ref 22–32)
Calcium: 9.2 mg/dL (ref 8.9–10.3)
Chloride: 106 mmol/L (ref 98–111)
Creatinine, Ser: 1.17 mg/dL — ABNORMAL HIGH (ref 0.44–1.00)
GFR calc Af Amer: 54 mL/min — ABNORMAL LOW (ref >60)
GFR calc non Af Amer: 47 mL/min — ABNORMAL LOW (ref >60)
Glucose, Bld: 203 mg/dL — ABNORMAL HIGH (ref 70–99)
Potassium: 4.6 mmol/L (ref 3.5–5.1)
Sodium: 137 mmol/L (ref 135–145)
Total Bilirubin: 1.4 mg/dL — ABNORMAL HIGH (ref 0.3–1.2)
Total Protein: 6.8 g/dL (ref 6.5–8.1)

## 2020-07-26 LAB — CBG MONITORING, ED: Glucose-Capillary: 201 mg/dL — ABNORMAL HIGH (ref 70–99)

## 2020-07-26 LAB — PROTIME-INR
INR: 1.1 (ref 0.8–1.2)
Prothrombin Time: 13.9 seconds (ref 11.4–15.2)

## 2020-07-26 LAB — APTT: aPTT: 24 seconds (ref 24–36)

## 2020-07-26 LAB — SARS CORONAVIRUS 2 BY RT PCR (HOSPITAL ORDER, PERFORMED IN ~~LOC~~ HOSPITAL LAB): SARS Coronavirus 2: NEGATIVE

## 2020-07-26 LAB — GLUCOSE, CAPILLARY
Glucose-Capillary: 242 mg/dL — ABNORMAL HIGH (ref 70–99)
Glucose-Capillary: 386 mg/dL — ABNORMAL HIGH (ref 70–99)

## 2020-07-26 LAB — ETHANOL: Alcohol, Ethyl (B): <10 mg/dL (ref <10)

## 2020-07-26 LAB — MRSA PCR SCREENING: MRSA by PCR: NEGATIVE

## 2020-07-26 SURGERY — IR WITH ANESTHESIA
Anesthesia: General

## 2020-07-26 MED ORDER — PHENYLEPHRINE HCL-NACL 10-0.9 MG/250ML-% IV SOLN
INTRAVENOUS | Status: AC
  Filled 2020-07-26: qty 250

## 2020-07-26 MED ORDER — IOHEXOL 300 MG/ML  SOLN
150.0000 mL | Freq: Once | INTRAMUSCULAR | Status: AC | PRN
  Administered 2020-07-26: 20 mL via INTRA_ARTERIAL

## 2020-07-26 MED ORDER — ROCURONIUM BROMIDE 100 MG/10ML IV SOLN
INTRAVENOUS | Status: DC | PRN
Start: 2020-07-26 — End: 2020-07-26
  Administered 2020-07-26 (×2): 30 mg via INTRAVENOUS

## 2020-07-26 MED ORDER — SUCCINYLCHOLINE CHLORIDE 20 MG/ML IJ SOLN
INTRAMUSCULAR | Status: DC | PRN
  Administered 2020-07-26: 60 mg via INTRAVENOUS

## 2020-07-26 MED ORDER — TICAGRELOR 90 MG PO TABS
ORAL_TABLET | ORAL | Status: AC
  Filled 2020-07-26: qty 2

## 2020-07-26 MED ORDER — ASPIRIN 81 MG PO CHEW
CHEWABLE_TABLET | ORAL | Status: AC
  Filled 2020-07-26: qty 1

## 2020-07-26 MED ORDER — PHENYLEPHRINE 40 MCG/ML (10ML) SYRINGE FOR IV PUSH (FOR BLOOD PRESSURE SUPPORT)
PREFILLED_SYRINGE | INTRAVENOUS | Status: DC | PRN
  Administered 2020-07-26: 120 ug via INTRAVENOUS
  Administered 2020-07-26 (×4): 80 ug via INTRAVENOUS

## 2020-07-26 MED ORDER — LACTATED RINGERS IV SOLN: INTRAVENOUS | Status: DC | PRN

## 2020-07-26 MED ORDER — FENTANYL CITRATE (PF) 100 MCG/2ML IJ SOLN: 25.0000 ug | Freq: Once | INTRAMUSCULAR | Status: DC

## 2020-07-26 MED ORDER — DOCUSATE SODIUM 50 MG/5ML PO LIQD
100.0000 mg | Freq: Two times a day (BID) | ORAL | Status: DC
  Filled 2020-07-26 (×2): qty 10

## 2020-07-26 MED ORDER — ACETAMINOPHEN 650 MG RE SUPP: 650.0000 mg | RECTAL | Status: DC | PRN

## 2020-07-26 MED ORDER — EPTIFIBATIDE 20 MG/10ML IV SOLN
INTRAVENOUS | Status: AC
  Filled 2020-07-26: qty 10

## 2020-07-26 MED ORDER — INSULIN ASPART 100 UNIT/ML ~~LOC~~ SOLN
0.0000 [IU] | SUBCUTANEOUS | Status: DC
  Administered 2020-07-26: 5 [IU] via SUBCUTANEOUS
  Administered 2020-07-26: 15 [IU] via SUBCUTANEOUS
  Administered 2020-07-27: 2 [IU] via SUBCUTANEOUS
  Administered 2020-07-28 (×2): 3 [IU] via SUBCUTANEOUS
  Administered 2020-07-28: 5 [IU] via SUBCUTANEOUS
  Administered 2020-07-28 (×2): 3 [IU] via SUBCUTANEOUS
  Administered 2020-07-29: 2 [IU] via SUBCUTANEOUS
  Administered 2020-07-29 (×2): 3 [IU] via SUBCUTANEOUS
  Administered 2020-07-29: 5 [IU] via SUBCUTANEOUS
  Administered 2020-07-29: 3 [IU] via SUBCUTANEOUS
  Administered 2020-07-29: 2 [IU] via SUBCUTANEOUS
  Administered 2020-07-29 – 2020-07-30 (×5): 5 [IU] via SUBCUTANEOUS
  Administered 2020-07-30 (×2): 3 [IU] via SUBCUTANEOUS
  Administered 2020-07-31 (×2): 8 [IU] via SUBCUTANEOUS
  Administered 2020-07-31: 3 [IU] via SUBCUTANEOUS
  Administered 2020-07-31 (×2): 5 [IU] via SUBCUTANEOUS
  Administered 2020-07-31: 8 [IU] via SUBCUTANEOUS
  Administered 2020-08-01: 5 [IU] via SUBCUTANEOUS
  Administered 2020-08-01: 11 [IU] via SUBCUTANEOUS
  Administered 2020-08-01: 8 [IU] via SUBCUTANEOUS
  Administered 2020-08-01: 5 [IU] via SUBCUTANEOUS
  Administered 2020-08-01 (×2): 8 [IU] via SUBCUTANEOUS
  Administered 2020-08-02 – 2020-08-03 (×11): 3 [IU] via SUBCUTANEOUS
  Administered 2020-08-03: 2 [IU] via SUBCUTANEOUS
  Administered 2020-08-04 (×2): 3 [IU] via SUBCUTANEOUS
  Administered 2020-08-04: 5 [IU] via SUBCUTANEOUS
  Administered 2020-08-04 (×3): 3 [IU] via SUBCUTANEOUS
  Administered 2020-08-05: 2 [IU] via SUBCUTANEOUS
  Administered 2020-08-05: 3 [IU] via SUBCUTANEOUS
  Administered 2020-08-05: 5 [IU] via SUBCUTANEOUS
  Administered 2020-08-05 – 2020-08-06 (×3): 2 [IU] via SUBCUTANEOUS
  Administered 2020-08-06: 3 [IU] via SUBCUTANEOUS
  Administered 2020-08-06 (×2): 2 [IU] via SUBCUTANEOUS
  Administered 2020-08-06: 3 [IU] via SUBCUTANEOUS
  Administered 2020-08-07: 2 [IU] via SUBCUTANEOUS
  Administered 2020-08-07: 3 [IU] via SUBCUTANEOUS
  Administered 2020-08-07 (×2): 2 [IU] via SUBCUTANEOUS
  Administered 2020-08-07 (×2): 3 [IU] via SUBCUTANEOUS
  Administered 2020-08-08: 2 [IU] via SUBCUTANEOUS
  Administered 2020-08-08 (×3): 3 [IU] via SUBCUTANEOUS
  Administered 2020-08-08: 2 [IU] via SUBCUTANEOUS
  Administered 2020-08-09: 3 [IU] via SUBCUTANEOUS
  Administered 2020-08-09: 2 [IU] via SUBCUTANEOUS
  Administered 2020-08-09: 3 [IU] via SUBCUTANEOUS
  Administered 2020-08-10: 2 [IU] via SUBCUTANEOUS
  Administered 2020-08-10: 3 [IU] via SUBCUTANEOUS
  Administered 2020-08-10: 2 [IU] via SUBCUTANEOUS
  Administered 2020-08-10 – 2020-08-11 (×4): 3 [IU] via SUBCUTANEOUS
  Administered 2020-08-11: 2 [IU] via SUBCUTANEOUS
  Administered 2020-08-12 (×2): 3 [IU] via SUBCUTANEOUS
  Administered 2020-08-12 – 2020-08-13 (×3): 2 [IU] via SUBCUTANEOUS
  Administered 2020-08-13: 3 [IU] via SUBCUTANEOUS
  Administered 2020-08-13: 5 [IU] via SUBCUTANEOUS
  Administered 2020-08-14: 2 [IU] via SUBCUTANEOUS
  Administered 2020-08-14 (×2): 3 [IU] via SUBCUTANEOUS
  Administered 2020-08-15 (×3): 2 [IU] via SUBCUTANEOUS
  Administered 2020-08-15: 3 [IU] via SUBCUTANEOUS
  Administered 2020-08-16: 2 [IU] via SUBCUTANEOUS
  Administered 2020-08-17: 3 [IU] via SUBCUTANEOUS
  Administered 2020-08-17 (×2): 2 [IU] via SUBCUTANEOUS

## 2020-07-26 MED ORDER — SODIUM CHLORIDE 0.9 % IV SOLN
50.0000 mL | Freq: Once | INTRAVENOUS | Status: AC
  Administered 2020-07-26: 50 mL via INTRAVENOUS

## 2020-07-26 MED ORDER — STROKE: EARLY STAGES OF RECOVERY BOOK
Freq: Once | Status: DC
  Filled 2020-07-26: qty 1

## 2020-07-26 MED ORDER — ESMOLOL HCL 100 MG/10ML IV SOLN
INTRAVENOUS | Status: DC | PRN
Start: 2020-07-26 — End: 2020-07-26
  Administered 2020-07-26 (×2): 50 mg via INTRAVENOUS

## 2020-07-26 MED ORDER — ORAL CARE MOUTH RINSE
15.0000 mL | OROMUCOSAL | Status: DC
  Administered 2020-07-26 – 2020-08-08 (×127): 15 mL via OROMUCOSAL

## 2020-07-26 MED ORDER — ALTEPLASE (STROKE) FULL DOSE INFUSION
0.9000 mg/kg | Freq: Once | INTRAVENOUS | Status: AC
  Administered 2020-07-26: 52.5 mg via INTRAVENOUS
  Filled 2020-07-26: qty 100

## 2020-07-26 MED ORDER — DILTIAZEM HCL ER COATED BEADS 240 MG PO CP24: 240.0000 mg | ORAL_CAPSULE | Freq: Every day | ORAL | Status: DC

## 2020-07-26 MED ORDER — FENTANYL CITRATE (PF) 100 MCG/2ML IJ SOLN
INTRAMUSCULAR | Status: DC | PRN
  Administered 2020-07-26: 100 ug via INTRAVENOUS

## 2020-07-26 MED ORDER — DEXAMETHASONE SODIUM PHOSPHATE 10 MG/ML IJ SOLN
INTRAMUSCULAR | Status: DC | PRN
Start: 2020-07-26 — End: 2020-07-26
  Administered 2020-07-26: 4 mg via INTRAVENOUS

## 2020-07-26 MED ORDER — PHENYLEPHRINE HCL-NACL 10-0.9 MG/250ML-% IV SOLN
INTRAVENOUS | Status: DC | PRN
Start: 2020-07-26 — End: 2020-07-26
  Administered 2020-07-26: 25 ug/min via INTRAVENOUS

## 2020-07-26 MED ORDER — ACETAMINOPHEN 160 MG/5ML PO SOLN
650.0000 mg | ORAL | Status: DC | PRN
  Administered 2020-08-01 – 2020-08-22 (×18): 650 mg
  Filled 2020-07-26 (×15): qty 20.3

## 2020-07-26 MED ORDER — CEFAZOLIN SODIUM-DEXTROSE 2-4 GM/100ML-% IV SOLN
INTRAVENOUS | Status: AC
  Filled 2020-07-26: qty 100

## 2020-07-26 MED ORDER — FENTANYL BOLUS VIA INFUSION
25.0000 ug | INTRAVENOUS | Status: DC | PRN
  Filled 2020-07-26: qty 25

## 2020-07-26 MED ORDER — ROSUVASTATIN CALCIUM 20 MG PO TABS: 20.0000 mg | ORAL_TABLET | Freq: Every day | ORAL | Status: DC

## 2020-07-26 MED ORDER — LIDOCAINE HCL (CARDIAC) PF 100 MG/5ML IV SOSY
PREFILLED_SYRINGE | INTRAVENOUS | Status: DC | PRN
Start: 2020-07-26 — End: 2020-07-26
  Administered 2020-07-26: 60 mg via INTRATRACHEAL

## 2020-07-26 MED ORDER — MIDAZOLAM HCL 2 MG/2ML IJ SOLN: 1.0000 mg | INTRAMUSCULAR | Status: DC | PRN

## 2020-07-26 MED ORDER — ROSUVASTATIN CALCIUM 20 MG PO TABS
20.0000 mg | ORAL_TABLET | Freq: Every day | ORAL | Status: DC
  Administered 2020-07-27 – 2020-08-23 (×28): 20 mg
  Filled 2020-07-26 (×28): qty 1

## 2020-07-26 MED ORDER — METOPROLOL TARTRATE 5 MG/5ML IV SOLN
INTRAVENOUS | Status: AC
  Filled 2020-07-26: qty 5

## 2020-07-26 MED ORDER — IOHEXOL 300 MG/ML  SOLN
50.0000 mL | Freq: Once | INTRAMUSCULAR | Status: AC | PRN
  Administered 2020-07-26: 5 mL via INTRA_ARTERIAL

## 2020-07-26 MED ORDER — IOHEXOL 300 MG/ML  SOLN
150.0000 mL | Freq: Once | INTRAMUSCULAR | Status: AC | PRN
  Administered 2020-07-26: 80 mL via INTRA_ARTERIAL

## 2020-07-26 MED ORDER — DILTIAZEM HCL-DEXTROSE 125-5 MG/125ML-% IV SOLN (PREMIX)
5.0000 mg/h | INTRAVENOUS | Status: DC
  Administered 2020-07-26 – 2020-07-28 (×3): 5 mg/h via INTRAVENOUS
  Filled 2020-07-26 (×4): qty 125

## 2020-07-26 MED ORDER — METOPROLOL TARTRATE 25 MG PO TABS
12.5000 mg | ORAL_TABLET | Freq: Two times a day (BID) | ORAL | Status: DC
  Administered 2020-07-27: 12.5 mg via ORAL
  Filled 2020-07-26 (×3): qty 1

## 2020-07-26 MED ORDER — CHLORHEXIDINE GLUCONATE CLOTH 2 % EX PADS
6.0000 | MEDICATED_PAD | Freq: Every day | CUTANEOUS | Status: DC
  Administered 2020-07-27 – 2020-08-12 (×14): 6 via TOPICAL

## 2020-07-26 MED ORDER — PANTOPRAZOLE SODIUM 40 MG IV SOLR
40.0000 mg | Freq: Every day | INTRAVENOUS | Status: DC
  Administered 2020-07-26: 40 mg via INTRAVENOUS
  Filled 2020-07-26: qty 40

## 2020-07-26 MED ORDER — CLOPIDOGREL BISULFATE 300 MG PO TABS
ORAL_TABLET | ORAL | Status: AC
  Filled 2020-07-26: qty 1

## 2020-07-26 MED ORDER — PHENYLEPHRINE HCL-NACL 10-0.9 MG/250ML-% IV SOLN
0.0000 ug/min | INTRAVENOUS | Status: DC
  Administered 2020-07-26: 25 ug/min via INTRAVENOUS
  Administered 2020-07-27 – 2020-07-28 (×2): 20 ug/min via INTRAVENOUS
  Filled 2020-07-26 (×3): qty 250

## 2020-07-26 MED ORDER — ACETAMINOPHEN 325 MG PO TABS
650.0000 mg | ORAL_TABLET | ORAL | Status: DC | PRN
  Administered 2020-08-12: 650 mg via ORAL
  Filled 2020-07-26 (×2): qty 2

## 2020-07-26 MED ORDER — TIROFIBAN HCL IN NACL 5-0.9 MG/100ML-% IV SOLN
INTRAVENOUS | Status: AC
  Filled 2020-07-26: qty 100

## 2020-07-26 MED ORDER — CLEVIDIPINE BUTYRATE 0.5 MG/ML IV EMUL
INTRAVENOUS | Status: DC | PRN
Start: 2020-07-26 — End: 2020-07-26
  Administered 2020-07-26: 2 mg/h via INTRAVENOUS

## 2020-07-26 MED ORDER — NITROGLYCERIN 1 MG/10 ML FOR IR/CATH LAB
INTRA_ARTERIAL | Status: AC
  Filled 2020-07-26: qty 10

## 2020-07-26 MED ORDER — CEFAZOLIN SODIUM-DEXTROSE 2-3 GM-%(50ML) IV SOLR
INTRAVENOUS | Status: DC | PRN
Start: 2020-07-26 — End: 2020-07-26
  Administered 2020-07-26: 2 g via INTRAVENOUS

## 2020-07-26 MED ORDER — VERAPAMIL HCL 2.5 MG/ML IV SOLN
INTRAVENOUS | Status: AC
  Filled 2020-07-26: qty 2

## 2020-07-26 MED ORDER — SENNOSIDES-DOCUSATE SODIUM 8.6-50 MG PO TABS: 1.0000 | ORAL_TABLET | Freq: Every evening | ORAL | Status: DC | PRN

## 2020-07-26 MED ORDER — METOPROLOL TARTRATE 5 MG/5ML IV SOLN
INTRAVENOUS | Status: DC | PRN
  Administered 2020-07-26 (×2): 2.5 mg via INTRAVENOUS

## 2020-07-26 MED ORDER — ACETAMINOPHEN 325 MG PO TABS: 650.0000 mg | ORAL_TABLET | ORAL | Status: DC | PRN

## 2020-07-26 MED ORDER — CLEVIDIPINE BUTYRATE 0.5 MG/ML IV EMUL: 0.0000 mg/h | INTRAVENOUS | Status: DC

## 2020-07-26 MED ORDER — SODIUM CHLORIDE 0.9 % IV SOLN: INTRAVENOUS | Status: AC

## 2020-07-26 MED ORDER — SODIUM CHLORIDE 0.9 % IV SOLN: INTRAVENOUS | Status: DC

## 2020-07-26 MED ORDER — CLEVIDIPINE BUTYRATE 0.5 MG/ML IV EMUL: 0.0000 mg/h | INTRAVENOUS | Status: AC

## 2020-07-26 MED ORDER — CHLORHEXIDINE GLUCONATE 0.12% ORAL RINSE (MEDLINE KIT)
15.0000 mL | Freq: Two times a day (BID) | OROMUCOSAL | Status: DC
  Administered 2020-07-26 – 2020-08-08 (×26): 15 mL via OROMUCOSAL

## 2020-07-26 MED ORDER — SODIUM CHLORIDE 0.9 % IV SOLN: 250.0000 mL | INTRAVENOUS | Status: DC

## 2020-07-26 MED ORDER — FENTANYL 2500MCG IN NS 250ML (10MCG/ML) PREMIX INFUSION
25.0000 ug/h | INTRAVENOUS | Status: DC
  Administered 2020-07-26: 25 ug/h via INTRAVENOUS
  Filled 2020-07-26 (×3): qty 250

## 2020-07-26 MED ORDER — PROPOFOL 10 MG/ML IV BOLUS
INTRAVENOUS | Status: DC | PRN
  Administered 2020-07-26: 50 mg via INTRAVENOUS
  Administered 2020-07-26: 100 mg via INTRAVENOUS

## 2020-07-26 MED ORDER — LABETALOL HCL 5 MG/ML IV SOLN
20.0000 mg | Freq: Once | INTRAVENOUS | Status: AC
  Administered 2020-07-26: 20 mg via INTRAVENOUS

## 2020-07-26 MED ORDER — CLEVIDIPINE BUTYRATE 0.5 MG/ML IV EMUL
INTRAVENOUS | Status: AC
  Administered 2020-07-26: 2 mg/h via INTRAVENOUS
  Filled 2020-07-26: qty 50

## 2020-07-26 MED ORDER — IOHEXOL 350 MG/ML SOLN
100.0000 mL | Freq: Once | INTRAVENOUS | Status: AC | PRN
  Administered 2020-07-26: 100 mL via INTRAVENOUS

## 2020-07-26 MED ORDER — ONDANSETRON HCL 4 MG/2ML IJ SOLN
INTRAMUSCULAR | Status: DC | PRN
Start: 2020-07-26 — End: 2020-07-26
  Administered 2020-07-26: 4 mg via INTRAVENOUS

## 2020-07-26 MED ORDER — SODIUM CHLORIDE (PF) 0.9 % IJ SOLN
INTRAVENOUS | Status: DC | PRN
  Administered 2020-07-26: 25 ug via INTRA_ARTERIAL

## 2020-07-26 MED ORDER — ACETAMINOPHEN 160 MG/5ML PO SOLN
650.0000 mg | ORAL | Status: DC | PRN
  Administered 2020-07-29 – 2020-08-03 (×14): 650 mg
  Filled 2020-07-26 (×18): qty 20.3

## 2020-07-26 NOTE — Progress Notes (Signed)
Patient ID: Samantha Paul, female   DOB: [birth year], 74 y.o.   MRN: [id number] INR. 74 Y RT H F LSW  930- am MRSS 0 New onset RT gaze deviation,and lt sided weakness and muteness. CT brain NO ICH . ASPECTS 10 CTA occluded RT ICXA terminus,RT ACA A1and RT MCA prox. CTP core of  16 ml  Versus penumbra of 136 ml. Endovascular treatment for revascularization D/W spouse via an  interpreter by Dr .Otelia Limes. . Procedure ,reasons ,alternatives reviewed. Risks of ICH of 10 to 15 % ,worsening neuro status ,death reviewed. Spouse expressed understanding and provided consent to proceed. S.Guiseppe Flanagan MD

## 2020-07-26 NOTE — Procedures (Signed)
S/P RT common carotid arteriogram . RT CFA approach. S/P revascularization of RT ICA T occlusion with x 1 pass with solitaireX  14mm x 20 mm device and contact aspiration and prox flow arrest \\achieving  a TICI 2C revascularization of RT MCA and a TICI 3 revascularization of RT ACA . Post treatment 21F angioseal applied for RT groin hemostasis . Distal pulses all dopplerable ,unchanged.Marland Kitchen  Post procedure CT  Shows focal contrast stain in the RT globus pallidus. No gross mass effect or shift. Patient to be left intubated to protect airway and because of her neurological condition. S.Otha Monical MD

## 2020-07-26 NOTE — Progress Notes (Signed)
eLink Physician-Brief Progress Note Patient Name: Samantha Paul DOB: 1946-11-06 MRN: [id number]   Date of Service  07/26/2020  HPI/Events of Note  Hypotension - BP = 90/66. Goal SBP = 120-140. No CVL or CVP available.   eICU Interventions  Plan: 1. Phenylephrine IV infusion via PIV. Titrate to SBP = 120-140.     Intervention Category Major Interventions: Hypotension - evaluation and management  Lenell Antu 07/26/2020, 8:34 PM

## 2020-07-26 NOTE — Consult Note (Signed)
NAMEEkaterina Paul, MRN:  [id number], DOB:  [birth year], LOS: 0 ADMISSION DATE:  07/26/2020, CONSULTATION DATE:  07/26/20 REFERRING MD: Dr. Estanislado Paul, CHIEF COMPLAINT:  Left sided weakness    Brief History   74 y/o F admitted on 8/1 with R ACA / MCA occlusion s/p Neuro IR revascularization.  Large   History of present illness   74 y/o F who presented to [facility] on 8/1 as a code stroke after being found down on Samantha floor of her kitchen with left sided weakness.  She was unable to speak.  She reportedly stopped taking all her medications one week prior to admit.    Samantha Paul was last seen normal at 0930 and presented to Samantha ER at 1213.  She was taken immediately to CT for imaging which showed no acute findings on CT head.  However, CTA demonstrated embolic occlusion at Samantha right carotid terminus. Minimal flow seen in Samantha right MCA territory. Right ACA appears normally perfused, probably due to a patent anterior communicating artery.  Core infarct right frontal operculum 16cc, large penumbra, 136 cc in Samantha remainder of Samantha R MCA territory. She was taken emergently to [facility] for revascularization.  Samantha Paul was intubated for Samantha procedure.  She had presumed new atrial fibrillation on presentation and was started on cardizem. During Samantha procedure, she was also treated with Cleviprex for BP control.    PCCM consulted for post-operative ICU care.   Past Medical History  HTN  Back Pain  CVA - residual right sided weakness AF - diagnosed 06/2020 admit, discharged on cardizem, lopressor, eliquis Cholecystectomy - 06/30/20 DM II  HOCM - LVOT obstruction on ECHO 06/2020  Significant Hospital Events   8/01 Admit with R MCA / ACA occlusion s/p revascularization   Consults:    Procedures:  ETT 8/1 >>  L Radial Aline 8/1 >>   Significant Diagnostic Tests:   ECHO 07/03/20 >> LVEF 65-70%, narrow LVOT, turbulent flow through Samantha LV/LVOT, no RWMA, mild LVH, RV systolic function normal, normal PASP.  CT Code  Stroke 8/1 >> no acute CT finding, chronic small vessel ischemic changes  CTA Head / Neck 8/1 >> embolic occlusion at Samantha right carotid terminus. Minimal flow seen in Samantha right MCA territory. Right ACA appears normally perfused, probably due to a patent anterior communicating artery.  Core infarct right frontal operculum 16cc, large penumbra, 136 cc in Samantha remainder of Samantha R MCA territory. No atherosclerotic disease at either carotid bifurcation.     Micro Data:  COVID 8/1 >> negative  UA 8/1 >>   Antimicrobials:     Interim history/subjective:  As above   Objective   Blood pressure (!) 139/91, pulse (!) 125, temperature 97.6 F (36.4 C), temperature source Oral, resp. rate 23, weight 58.4 kg, SpO2 99 %.       No intake or output data in Samantha 24 hours ending 07/26/20 1538 Filed Weights   07/26/20 1245  Weight: 58.4 kg    Examination: General: small adult female lying in bed in NAD on vent  HEENT: MM pink/moist, ETT, pupils 33m sluggishly reactive Neuro: unresponsive on vent, post sedation / paralytics, right groin sheath site clean/dry/intact, no hematoma/soft CV: s1s2 irr irr, AF in 60's on monitor, no m/r/g PULM: non-labored on vent, lungs bilaterally coarse GI: soft, bsx4 active  Extremities: warm/dry, no edema  Skin: no rashes or lesions  Resolved Hospital Problem list      Assessment & Plan:   R ACA /  MCA Occlusion / Acute CVA S/p revascularization, large penumbra noted on initial CT imaging -SBP goal 120-140 -admit to neuro ICU per Stroke Team  -frequent neuro exams  -cleviprex gtt for BP goal  -assess ECHO  -anticipate am imaging prior to extubation, defer to Neuro / IR -crestor PT   Acute Respiratory Insufficiency in setting of Acute CVA -PRVC 8cc/kg, rate 18 -wean PEEP / FiO2 for sats > 90% -daily WUA / SBT  -follow intermittent CXR  -ABG at 1700  Need for Sedation on Mechanical Ventilation  -PAD protocol with fentanyl gtt, PRN versed for RASS -1 to  -2 -Daily WUA  Atrial Fibrillation  On cardizem, metoprolol, eliquis prior to admit, CHADS-VASC 6 -no anticoagulation / rate control for now with risk of hemorrhagic conversion  -tele monitoring  -cardizem gtt for rate control, consider transition to oral in am   Microcytic Anemia  -trend CBC  DM II with Hyperglycemia  -SSI  -assess Hgb A1c  -hold home metformin   Best practice:  Diet: NPO Pain/Anxiety/Delirium protocol (if indicated): PAD  VAP protocol (if indicated): in place  DVT prophylaxis: SCD's  GI prophylaxis: PPI  Glucose control: SSI Mobility: Bed Rest  Code Status: Full Code  Family Communication: Updated per Stroke Team  Disposition: ICU  Labs   CBC: Recent Labs  Lab 07/26/20 1211 07/26/20 1215  WBC 6.5  --   NEUTROABS 4.1  --   HGB 10.7* 12.9  HCT 37.7 38.0  MCV 75.1*  --   PLT 185  --     Basic Metabolic Panel: Recent Labs  Lab 07/26/20 1211 07/26/20 1215  NA 137 141  K 4.6 4.5  CL 106 106  CO2 19*  --   GLUCOSE 203* 198*  BUN 22 25*  CREATININE 1.17* 1.10*  CALCIUM 9.2  --    GFR: Estimated Creatinine Clearance: 35 mL/min (A) (by C-G formula based on SCr of 1.1 mg/dL (H)). Recent Labs  Lab 07/26/20 1211  WBC 6.5    Liver Function Tests: Recent Labs  Lab 07/26/20 1211  AST 20  ALT 16  ALKPHOS 90  BILITOT 1.4*  PROT 6.8  ALBUMIN 3.7   No results for input(s): LIPASE, AMYLASE in Samantha last 168 hours. No results for input(s): AMMONIA in Samantha last 168 hours.  ABG    Component Value Date/Time   TCO2 21 (L) 07/26/2020 1215     Coagulation Profile: Recent Labs  Lab 07/26/20 1211  INR 1.1    Cardiac Enzymes: No results for input(s): CKTOTAL, CKMB, CKMBINDEX, TROPONINI in Samantha last 168 hours.  HbA1C: Hgb A1c MFr Bld  Date/Time Value Ref Range Status  06/29/2020 07:34 AM 12.7 (H) 4.8 - 5.6 % Final    Comment:    (NOTE) Pre diabetes:          5.7%-6.4%  Diabetes:              >6.4%  Glycemic control for    <7.0% adults with diabetes   07/10/2018 03:32 AM 12.3 (H) 4.8 - 5.6 % Final    Comment:    (NOTE)         Prediabetes: 5.7 - 6.4         Diabetes: >6.4         Glycemic control for adults with diabetes: <7.0     CBG: Recent Labs  Lab 07/26/20 1209  GLUCAP 201*    Review of Systems:   Unable to complete as Paul is altered on   mechanical ventilation.   Past Medical History  She,  has a past medical history of Back pain, HTN (hypertension), Knee pain, and Stroke ([place]).   Surgical History    Past Surgical History:  Procedure Laterality Date  . CHOLECYSTECTOMY N/A 06/30/2020   Procedure: LAPAROSCOPIC CHOLECYSTECTOMY;  Surgeon: Georganna Skeans, MD;  Location: [place];  Service: General;  Laterality: N/A;     Social History   reports that she has never smoked. She has never used smokeless tobacco. She reports previous alcohol use. She reports previous drug use.   Family History   Her family history includes Arthritis in her brother.   Allergies Allergies  Allergen Reactions  . Metformin And Related     Abdominal pain and diarrhea     Home Medications  Prior to Admission medications   Medication Sig Start Date End Date Taking? Authorizing Provider  acetaminophen (TYLENOL) 325 MG tablet Take 2-3 tablets (650-975 mg total) by mouth every 6 (six) hours as needed for mild pain. 07/03/20   Jill Alexanders, PA-C  apixaban (ELIQUIS) 5 MG TABS tablet Take 1 tablet (5 mg total) by mouth 2 (two) times daily. 07/03/20   Oswald Hillock, MD  blood glucose meter kit and supplies KIT Dispense based on Paul and insurance preference. Use up to four times daily as directed. (FOR ICD-10--E11.9). 07/25/18   Love, Ivan Anchors, PA-C  diltiazem (CARTIA XT) 240 MG 24 hr capsule Take 1 capsule (240 mg total) by mouth daily. 07/03/20 07/03/21  Oswald Hillock, MD  insulin glargine (LANTUS) 100 UNIT/ML Solostar Pen Inject 20 Units into Samantha skin daily. 07/03/20   Oswald Hillock, MD  Insulin Pen Needle (PEN  NEEDLES) 32G X 4 MM MISC Use as directed with insulin pen 07/03/20   Oswald Hillock, MD  metFORMIN (GLUCOPHAGE) 500 MG tablet Take 1 tablet (500 mg total) by mouth 2 (two) times daily with a meal. 07/03/20 07/03/21  Oswald Hillock, MD  metoprolol tartrate (LOPRESSOR) 25 MG tablet Take 0.5 tablets (12.5 mg total) by mouth 2 (two) times daily. 07/03/20   Oswald Hillock, MD  oxyCODONE (OXY IR/ROXICODONE) 5 MG immediate release tablet Take 1 tablet (5 mg total) by mouth every 6 (six) hours as needed for moderate pain or severe pain (pain not releived by tylenol or ibuprofen). 07/03/20   Jill Alexanders, PA-C  rosuvastatin (CRESTOR) 20 MG tablet TAKE 1 TABLET(20 MG) BY MOUTH DAILY 12/20/18   Frann Rider, NP  senna-docusate (SENOKOT-S) 8.6-50 MG tablet Take 2 tablets by mouth at bedtime. 07/25/18   Bary Leriche, PA-C     Critical care time: 34 minutes      Noe Gens, MSN, NP-C [facility] 07/26/2020, 3:38 PM   Please see [email or web address] for pager details.

## 2020-07-26 NOTE — H&P (Addendum)
Neurology history and physical   CC: Code stroke with right gaze deviation, left-sided hemiplegia and dense receptive and expressive aphasia  History is obtained from: EMS and husband  HPI: Samantha Paul is a 74 y.o. female with history of atrial fibrillation, HTN, DM and stroke, who presents with acute onset of right gaze deviation, left-sided hemiplegia and dense receptive and expressive aphasia.  Per husband he saw his wife at her baseline normal state at approximately 9:30 AM this morning.  Later he heard a collapse and found her in the kitchen on the floor with right gaze deviation and left-sided paralysis.  She was also mute.  EMS was called to the scene.  They immediately brought her to the hospital as a code stroke.  Due to language barrier history is difficult to obtain.  Patient was immediately brought for a CT scan followed by CTA head and neck along with CT perfusion.  CTA head and neck showed an embolic occlusion of the right carotid terminus; minimal flow was seen in the right MCA territory.  On CTP, there was a core infarct in the right frontal operculum of 16 cc, with a large penumbra of 136 cc in the rright MCA territory.  Due to the cerebral perfusion findings and the right carotid occlusion, patient was a candidate for tPA.  This was discussed with patient's husband through interpreter.  Although she is prescribed a NOAC, it was confirmed that she had not been taking any of her medications for 1 week, specifically since Monday.  Through interpreter informed consent was obtained and tPA was administered. Informed consent for thrombectomy was also obtained and the patient was brought to [place].  LKW: 9:30 AM tpa given?:  Yes Premorbid modified Rankin scale (mRS): 0  NIHSS 1a Level of Conscious.: 1 1b LOC Questions: 2 1c LOC Commands: 2 2 Best Gaze: 2 3 Visual: 2 4 Facial Palsy: 2 5a Motor Arm - left: 3 5b Motor Arm - Right: 2 6a Motor Leg - Left: 2 6b Motor Leg - Right: 2 7 Limb  Ataxia: 0 8 Sensory: 1 9 Best Language: 3 10 Dysarthria: 0 11 Extinct. and Inatten.: 0 TOTAL: 24   Past Medical History:  Diagnosis Date  . Back pain   . HTN (hypertension)   . Knee pain   . Stroke [facility])     Family History  Problem Relation Age of Onset  . Arthritis Brother    Social History:   reports that she has never smoked. She has never used smokeless tobacco. She reports previous alcohol use. She reports previous drug use.  Home Medications: No current facility-administered medications on file prior to encounter.   Current Outpatient Medications on File Prior to Encounter  Medication Sig Dispense Refill  . acetaminophen (TYLENOL) 325 MG tablet Take 2-3 tablets (650-975 mg total) by mouth every 6 (six) hours as needed for mild pain.    Marland Kitchen apixaban (ELIQUIS) 5 MG TABS tablet Take 1 tablet (5 mg total) by mouth 2 (two) times daily. 60 tablet 3  . blood glucose meter kit and supplies KIT Dispense based on patient and insurance preference. Use up to four times daily as directed. (FOR ICD-10--E11.9). 1 each 0  . diltiazem (CARTIA XT) 240 MG 24 hr capsule Take 1 capsule (240 mg total) by mouth daily. 30 capsule 11  . insulin glargine (LANTUS) 100 UNIT/ML Solostar Pen Inject 20 Units into the skin daily. 15 mL 11  . Insulin Pen Needle (PEN NEEDLES) 32G X 4  MM MISC Use as directed with insulin pen 100 each 11  . metFORMIN (GLUCOPHAGE) 500 MG tablet Take 1 tablet (500 mg total) by mouth 2 (two) times daily with a meal. 60 tablet 11  . metoprolol tartrate (LOPRESSOR) 25 MG tablet Take 0.5 tablets (12.5 mg total) by mouth 2 (two) times daily. 60 tablet 2  . oxyCODONE (OXY IR/ROXICODONE) 5 MG immediate release tablet Take 1 tablet (5 mg total) by mouth every 6 (six) hours as needed for moderate pain or severe pain (pain not releived by tylenol or ibuprofen). 10 tablet 0  . rosuvastatin (CRESTOR) 20 MG tablet TAKE 1 TABLET(20 MG) BY MOUTH DAILY 30 tablet 0  . senna-docusate (SENOKOT-S)  8.6-50 MG tablet Take 2 tablets by mouth at bedtime. 60 tablet 0     Medications  Current Facility-Administered Medications:  .  alteplase (ACTIVASE) 1 mg/mL infusion 52.5 mg, 0.9 mg/kg, Intravenous, Once, Last Rate: 52.5 mL/hr at 07/26/20 1259, 52.5 mg at 07/26/20 1259 **FOLLOWED BY** 0.9 %  sodium chloride infusion, 50 mL, Intravenous, Once, Kerney Elbe, MD  Current Outpatient Medications:  .  acetaminophen (TYLENOL) 325 MG tablet, Take 2-3 tablets (650-975 mg total) by mouth every 6 (six) hours as needed for mild pain., Disp: , Rfl:  .  apixaban (ELIQUIS) 5 MG TABS tablet, Take 1 tablet (5 mg total) by mouth 2 (two) times daily., Disp: 60 tablet, Rfl: 3 .  blood glucose meter kit and supplies KIT, Dispense based on patient and insurance preference. Use up to four times daily as directed. (FOR ICD-10--E11.9)., Disp: 1 each, Rfl: 0 .  diltiazem (CARTIA XT) 240 MG 24 hr capsule, Take 1 capsule (240 mg total) by mouth daily., Disp: 30 capsule, Rfl: 11 .  insulin glargine (LANTUS) 100 UNIT/ML Solostar Pen, Inject 20 Units into the skin daily., Disp: 15 mL, Rfl: 11 .  Insulin Pen Needle (PEN NEEDLES) 32G X 4 MM MISC, Use as directed with insulin pen, Disp: 100 each, Rfl: 11 .  metFORMIN (GLUCOPHAGE) 500 MG tablet, Take 1 tablet (500 mg total) by mouth 2 (two) times daily with a meal., Disp: 60 tablet, Rfl: 11 .  metoprolol tartrate (LOPRESSOR) 25 MG tablet, Take 0.5 tablets (12.5 mg total) by mouth 2 (two) times daily., Disp: 60 tablet, Rfl: 2 .  oxyCODONE (OXY IR/ROXICODONE) 5 MG immediate release tablet, Take 1 tablet (5 mg total) by mouth every 6 (six) hours as needed for moderate pain or severe pain (pain not releived by tylenol or ibuprofen)., Disp: 10 tablet, Rfl: 0 .  rosuvastatin (CRESTOR) 20 MG tablet, TAKE 1 TABLET(20 MG) BY MOUTH DAILY, Disp: 30 tablet, Rfl: 0 .  senna-docusate (SENOKOT-S) 8.6-50 MG tablet, Take 2 tablets by mouth at bedtime., Disp: 60 tablet, Rfl: 0  ROS:  Unable to  obtain due to aphasia.   Exam: Current vital signs: BP (!) 168/111 (BP Location: Right Arm)   Pulse (!) 159   Temp 97.6 F (36.4 C) (Oral)   Resp 22   Wt 58.3 kg   SpO2 100%   BMI 26.86 kg/m  Vital signs in last 24 hours: Temp:  [97.6 F (36.4 C)] 97.6 F (36.4 C) (08/01 1240) Pulse Rate:  [159] 159 (08/01 1240) Resp:  [12-22] 22 (08/01 1241) BP: (147-168)/(111) 168/111 (08/01 1240) SpO2:  [100 %] 100 % (08/01 1240) Weight:  [58.3 kg] 58.3 kg (08/01 1245)   Constitutional: Appears well-developed and well-nourished.  Eyes: No scleral injection HENT: No OP obstrucion Head: Normocephalic.  Cardiovascular:  Palpable Respiratory: Effort normal, non-labored breathing GI: Soft.  No distension. There is no tenderness.  Skin: WDI  Neuro: Mental Status: Awake with decreased level of consciousness. Patient currently is mute, follows no commands. Cranial Nerves: II: Left hemianopsia III,IV, VI: Right gaze deviation. Pupils equal, round and reactive to light V: Unable to perform detailed assessment.  VII: Left facial droop VIII: No response to voice Motor/Sensory: Patient is moving right arm and right leg antigravity to noxious stimuli.  No movement of left arm, which also does not respond to noxious stimuli.  Left leg does move antigravity to noxious stimuli, but less briskly than on the right. Deep Tendon Reflexes: 2+ and symmetric in the biceps and patellae.  She does have crossed adductor response with patellar deep tendon reflexes Plantars: Bilateral upgoing toes Cerebellar: Unable to obtain secondary to depressed level of consciousness Gait: Unable to obtain  Labs I have reviewed labs in epic and the results pertinent to this consultation are:   CBC    Component Value Date/Time   WBC 6.5 07/26/2020 1211   RBC 5.02 07/26/2020 1211   HGB 12.9 07/26/2020 1215   HCT 38.0 07/26/2020 1215   PLT 185 07/26/2020 1211   MCV 75.1 (L) 07/26/2020 1211   MCH 21.3 (L)  07/26/2020 1211   MCHC 28.4 (L) 07/26/2020 1211   RDW 15.8 (H) 07/26/2020 1211   LYMPHSABS 1.9 07/26/2020 1211   MONOABS 0.4 07/26/2020 1211   EOSABS 0.0 07/26/2020 1211   BASOSABS 0.0 07/26/2020 1211    CMP     Component Value Date/Time   NA 141 07/26/2020 1215   K 4.5 07/26/2020 1215   CL 106 07/26/2020 1215   CO2 19 (L) 07/26/2020 1211   GLUCOSE 198 (H) 07/26/2020 1215   BUN 25 (H) 07/26/2020 1215   CREATININE 1.10 (H) 07/26/2020 1215   CALCIUM 9.2 07/26/2020 1211   PROT 6.8 07/26/2020 1211   ALBUMIN 3.7 07/26/2020 1211   AST 20 07/26/2020 1211   ALT 16 07/26/2020 1211   ALKPHOS 90 07/26/2020 1211   BILITOT 1.4 (H) 07/26/2020 1211   GFRNONAA 47 (L) 07/26/2020 1211   GFRAA 54 (L) 07/26/2020 1211    Lipid Panel     Component Value Date/Time   CHOL 209 (H) 07/10/2018 0332   TRIG 148 07/10/2018 0332   HDL 39 (L) 07/10/2018 0332   CHOLHDL 5.4 07/10/2018 0332   VLDL 30 07/10/2018 0332   LDLCALC 140 (H) 07/10/2018 0332     Imaging I have reviewed the images obtained:  CTA head and neck: Embolic occlusion of the right carotid terminus.  Minimal flow seen in the right MCA territory.  There was a core infarct right frontal operculum of 16 cc, with a large penumbra, 136 cc in the remainder of the right MCA territory.   Etta Quill PA-C [facility] [phone] 07/26/2020, 1:04 PM     Assessment:  74 year old female with atrial fibrillation who has not been taking her NOAC for 1 week, presents with acute onset of left hemiplegia, facial droop, aphasia and depressed level of consciousness. Last seen normal at 9;30 AM.   -- CT, CTA head and neck along with perfusion reveal embolic occlusion of the right carotid terminus.  Minimal flow seen in the right MCA territory.  There is a core infarct in the right frontal operculum of 16 cc, with a large penumbra of 136 cc.  -- NIHSS of 24 -- The patient is fully functioning regarding ambulation and ADLs  at baseline --  After comprehensive review of possible contraindications, she has no absolute contraindications to tPA administration. Patient is a tPA candidate. Discussed extensively the risks/benefits of tPA treatment vs. no treatment with her husband, including risks of hemorrhage and death with tPA administration versus worse overall outcomes on average in patients within tPA time window who are not administered tPA. Language interpretation services are used. The patient's aphasia precludes meaningful medical decision making on her part at this time. Overall benefits of tPA regarding long-term prognosis are felt to outweigh risks. The patient's husband expressed understanding and wish to proceed with tPA.  -- The patient is a VIR candidate. Risks/benefits of the procedure were discussed extensively with patient's husband, with interpreter assistance, including approximately 50% chance of significant improvement relative to an approximate 10% chance of subarachnoid hemorrhage with possibility of significant worsening including death. The patient's husband expressed understanding and provided informed consent to proceed with VIR. All questions answered. -- Stroke risk factors: atrial fibrillation, HTN, DM and stroke      Plan: 1. Admitting to Neuro ICU.  2. Post-tPA and VIR order set to include frequent neuro checks and BP management.  3. No antiplatelet medications or anticoagulants for at least 24 hours following tPA.  4. DVT prophylaxis with SCDs.  5. Will need to be started on a statin.  6. Will need to be restarted on her anticoagulant if repeat CT at 24 hours is negative for hemorrhagic conversion.  7. TTE 8. Cardiac telemetry.  9. MRI brain  10. PT/OT/Speech.  11. NPO until passes swallow evaluation.  12. Sliding scale insulin.  13. Fasting lipid panel, HgbA1c.  I have seen and examined the patient. I have formulated the assessment and recommendations. My exam findings were observed and documented by  Etta Quill, PA.  Electronically signed: Dr. Kerney Elbe

## 2020-07-26 NOTE — Progress Notes (Signed)
Pt transported from IR to CT via ventilator. Then patient transported from CT to PACU. No complications during transport. Report given to Terie Purser RRT RCP.

## 2020-07-26 NOTE — Anesthesia Procedure Notes (Signed)
Procedure Name: Intubation Date/Time: 07/26/2020 1:20 PM Performed by: Rachel Moulds, CRNA Pre-anesthesia Checklist: Patient identified, Emergency Drugs available, Suction available, Patient being monitored and Timeout performed Patient Re-evaluated:Patient Re-evaluated prior to induction Oxygen Delivery Method: Circle system utilized Preoxygenation: Pre-oxygenation with 100% oxygen Induction Type: IV induction Grade View: Grade I Tube type: Oral Tube size: 7.5 mm Number of attempts: 1 Airway Equipment and Method: Stylet and Video-laryngoscopy Placement Confirmation: ETT inserted through vocal cords under direct vision,  positive ETCO2,  CO2 detector and breath sounds checked- equal and bilateral Secured at: 20 cm Tube secured with: Tape Dental Injury: Teeth and Oropharynx as per pre-operative assessment

## 2020-07-26 NOTE — Progress Notes (Signed)
Translator: Modesta Messing   Pager:[phone] Cell: [phone]

## 2020-07-26 NOTE — Anesthesia Procedure Notes (Signed)
Arterial Line Insertion Start/End8/12/2019 1:45 PM, 07/26/2020 1:55 PM Performed by: Leonides Grills, MD, anesthesiologist  Patient location: Pre-op. Preanesthetic checklist: patient identified, IV checked, site marked, risks and benefits discussed, surgical consent, monitors and equipment checked, pre-op evaluation, timeout performed and anesthesia consent Patient sedated Left, radial was placed Catheter size: 20 Fr Hand hygiene performed , maximum sterile barriers used  and Seldinger technique used  Attempts: 2 (Previous attempts by CRNA) Procedure performed using ultrasound guided technique. Ultrasound Notes:anatomy identified, needle tip was noted to be adjacent to the nerve/plexus identified and no ultrasound evidence of intravascular and/or intraneural injection Following insertion, dressing applied and Biopatch. Post procedure assessment: normal and unchanged  Patient tolerated the procedure well with no immediate complications.

## 2020-07-26 NOTE — Anesthesia Postprocedure Evaluation (Signed)
Anesthesia Post Note  Patient: Samantha Paul  Procedure(s) Performed: IR WITH ANESTHESIA (N/A )     Patient location during evaluation: PACU Anesthesia Type: General Level of consciousness: sedated Pain management: pain level controlled Vital Signs Assessment: post-procedure vital signs reviewed and stable Respiratory status: patient remains intubated per anesthesia plan Cardiovascular status: stable Postop Assessment: no apparent nausea or vomiting Anesthetic complications: no   No complications documented.  Last Vitals:  Vitals:   07/26/20 1815 07/26/20 1816  BP: (!) 89/69   Pulse: 47   Resp: 16 16  Temp:    SpO2: 100%     Last Pain:  Vitals:   07/26/20 1736  TempSrc: Axillary                 Be[place] Cisar P Emalee Knies

## 2020-07-26 NOTE — ED Provider Notes (Signed)
[facility] EMERGENCY DEPARTMENT Provider Note   CSN: [id number] Arrival date & time: 07/26/20  1206     History No chief complaint on file.   Samantha Paul is a 74 y.o. female.  HPI Patient has prior history of stroke and hypertension.  Patient's husband last saw her well at 9:30 AM in the morning.  He heard her collapse and found her on the floor with a right gaze deviation and left-sided paralysis.  Patient could not speak.  EMS brought her to the emergency department with code stroke designation.  Patient cannot give any history.  Patient's husband had added that patient has history of atrial fibrillation but she self discontinued NOAC, not wishing to be on medication anymore.    Past Medical History:  Diagnosis Date  . Back pain   . HTN (hypertension)   . Knee pain   . Stroke [facility])     Patient Active Problem List   Diagnosis Date Noted  . Sepsis ([place]) 06/29/2020  . DKA (diabetic ketoacidoses) ([place]) 06/29/2020  . ARF (acute renal failure) ([place]) 06/29/2020  . Hemiparesis affecting right side as late effect of stroke ([place])   . Acute blood loss anemia   . Left pontine stroke ([place]) 07/13/2018  . Essential hypertension   . Diabetes mellitus type 2 in nonobese ([facility])   . CVA (cerebral vascular accident) ([place]) 07/10/2018  . Hypertensive urgency 07/10/2018  . Hyperglycemia 07/10/2018  . Right sided weakness 07/10/2018  . Hyperlipidemia     Past Surgical History:  Procedure Laterality Date  . CHOLECYSTECTOMY N/A 06/30/2020   Procedure: LAPAROSCOPIC CHOLECYSTECTOMY;  Surgeon: Georganna Skeans, MD;  Location: [facility];  Service: General;  Laterality: N/A;     OB History   No obstetric history on file.     Family History  Problem Relation Age of Onset  . Arthritis Brother     Social History   Tobacco Use  . Smoking status: Never Smoker  . Smokeless tobacco: Never Used  Substance Use Topics  . Alcohol use: Not Currently  . Drug use: Not Currently     Home Medications Prior to Admission medications   Medication Sig Start Date End Date Taking? Authorizing Provider  acetaminophen (TYLENOL) 325 MG tablet Take 2-3 tablets (650-975 mg total) by mouth every 6 (six) hours as needed for mild pain. 07/03/20   Jill Alexanders, PA-C  apixaban (ELIQUIS) 5 MG TABS tablet Take 1 tablet (5 mg total) by mouth 2 (two) times daily. 07/03/20   Oswald Hillock, MD  blood glucose meter kit and supplies KIT Dispense based on patient and insurance preference. Use up to four times daily as directed. (FOR ICD-10--E11.9). 07/25/18   Love, Ivan Anchors, PA-C  diltiazem (CARTIA XT) 240 MG 24 hr capsule Take 1 capsule (240 mg total) by mouth daily. 07/03/20 07/03/21  Oswald Hillock, MD  insulin glargine (LANTUS) 100 UNIT/ML Solostar Pen Inject 20 Units into the skin daily. 07/03/20   Oswald Hillock, MD  Insulin Pen Needle (PEN NEEDLES) 32G X 4 MM MISC Use as directed with insulin pen 07/03/20   Oswald Hillock, MD  metFORMIN (GLUCOPHAGE) 500 MG tablet Take 1 tablet (500 mg total) by mouth 2 (two) times daily with a meal. 07/03/20 07/03/21  Oswald Hillock, MD  metoprolol tartrate (LOPRESSOR) 25 MG tablet Take 0.5 tablets (12.5 mg total) by mouth 2 (two) times daily. 07/03/20   Oswald Hillock, MD  oxyCODONE (OXY IR/ROXICODONE) 5 MG immediate  release tablet Take 1 tablet (5 mg total) by mouth every 6 (six) hours as needed for moderate pain or severe pain (pain not releived by tylenol or ibuprofen). 07/03/20   Jill Alexanders, PA-C  rosuvastatin (CRESTOR) 20 MG tablet TAKE 1 TABLET(20 MG) BY MOUTH DAILY 12/20/18   Frann Rider, NP  senna-docusate (SENOKOT-S) 8.6-50 MG tablet Take 2 tablets by mouth at bedtime. 07/25/18   Love, Ivan Anchors, PA-C    Allergies    Metformin and related  Review of Systems   Review of Systems Level 5 caveat cannot obtain review of systems due to patient condition. Physical Exam Updated Vital Signs BP (!) 139/91   Pulse (!) 125   Temp 97.6 F (36.4 C) (Oral)    Resp 23   Wt 58.4 kg   SpO2 99%   BMI 26.91 kg/m   Physical Exam Constitutional:      Comments: Patient is awake with very notable right gaze deviation, head turned slightly.  Airway intact with some slight sonorous respiration.  HENT:     Head: Normocephalic and atraumatic.     Mouth/Throat:     Pharynx: Oropharynx is clear.  Eyes:     Comments: Significant right gaze deviation.  Patient does not move past midline.  Cardiovascular:     Comments: Tachycardia, irregularly irregular. Pulmonary:     Comments: Slightly sonorous respiration but airway intact without pooling secretion, lungs grossly clear.  Respirations nonlabored. Abdominal:     General: There is no distension.     Palpations: Abdomen is soft.  Musculoskeletal:        General: No swelling.     Right lower leg: No edema.  Skin:    General: Skin is warm and dry.  Neurological:     Comments: Patient obtunded.  No speech.  Profound right gaze deviation.  Patient is flaccid on the left.  With right extremities being moved and manipulated she will move them and reach.  She cannot follow any commands.     ED Results / Procedures / Treatments   Labs (all labs ordered are listed, but only abnormal results are displayed) Labs Reviewed  CBC - Abnormal; Notable for the following components:      Result Value   Hemoglobin 10.7 (*)    MCV 75.1 (*)    MCH 21.3 (*)    MCHC 28.4 (*)    RDW 15.8 (*)    All other components within normal limits  COMPREHENSIVE METABOLIC PANEL - Abnormal; Notable for the following components:   CO2 19 (*)    Glucose, Bld 203 (*)    Creatinine, Ser 1.17 (*)    Total Bilirubin 1.4 (*)    GFR calc non Af Amer 47 (*)    GFR calc Af Amer 54 (*)    All other components within normal limits  I-STAT CHEM 8, ED - Abnormal; Notable for the following components:   BUN 25 (*)    Creatinine, Ser 1.10 (*)    Glucose, Bld 198 (*)    TCO2 21 (*)    All other components within normal limits  CBG  MONITORING, ED - Abnormal; Notable for the following components:   Glucose-Capillary 201 (*)    All other components within normal limits  SARS CORONAVIRUS 2 BY RT PCR (HOSPITAL ORDER, [facility])  ETHANOL  PROTIME-INR  APTT  DIFFERENTIAL  RAPID URINE DRUG SCREEN, HOSP PERFORMED  URINALYSIS, ROUTINE W REFLEX MICROSCOPIC    EKG  EKG Interpretation  Date/Time:  Sunday July 26 2020 12:41:03 EDT Ventricular Rate:  169 PR Interval:    QRS Duration: 88 QT Interval:  243 QTC Calculation: 408 R Axis:   79 Text Interpretation: Atrial fibrillation with rapid V-rate Repolarization abnormality, prob rate related agree, similar to old Confirmed by Charlesetta Shanks [phone]) on 07/26/2020 2:06:02 PM   Radiology CT CEREBRAL PERFUSION W CONTRAST  Result Date: 07/26/2020 CLINICAL DATA:  Acute presentation with left body weakness and speech disturbance. EXAM: CT ANGIOGRAPHY HEAD AND NECK CT PERFUSION BRAIN TECHNIQUE: Multidetector CT imaging of the head and neck was performed using the standard protocol during bolus administration of intravenous contrast. Multiplanar CT image reconstructions and MIPs were obtained to evaluate the vascular anatomy. Carotid stenosis measurements (when applicable) are obtained utilizing NASCET criteria, using the distal internal carotid diameter as the denominator. Multiphase CT imaging of the brain was performed following IV bolus contrast injection. Subsequent parametric perfusion maps were calculated using RAPID software. CONTRAST:  Not annotated COMPARISON:  Head CT earlier same day FINDINGS: CTA NECK FINDINGS Aortic arch: No aortic atherosclerotic calcification seen. No aneurysm or dissection. Branching pattern is normal with wide patency of the origins. Right carotid system: Common carotid artery widely patent to the bifurcation. No atherosclerotic disease at the carotid bifurcation. ICA widely patent. Left carotid system: Common carotid artery  widely patent to the bifurcation. Carotid bifurcation is normal without soft or calcified plaque. ICA widely patent. Vertebral arteries: Both vertebral artery origins are widely patent. Both vertebral arteries are normal through the cervical region to the foramen magnum. Skeleton: Ordinary cervical spondylosis. Other neck: No mass or lymphadenopathy. Upper chest: No active chest disease. 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 Review of the MIP images confirms the above findings CTA HEAD FINDINGS Anterior circulation: Complete occlusion of the right carotid terminus. Minimal flow visible in the right MCA territory. Right ACA receives it supply through the anterior communicating route. No left stenosis or occlusion. Right PCA arises from the carotid artery immediately proximal to the occlusion. Posterior circulation: Both vertebral arteries patent to the basilar. Atherosclerotic narrowing the distal vertebral arteries and of the basilar. Posterior circulation branch vessels are patent. Right PCA receives most of it supply from the anterior circulation. Venous sinuses: Patent and normal. Anatomic variants: None significant. Review of the MIP images confirms the above findings CT Brain Perfusion Findings: ASPECTS: 10 CBF (<30%) Volume: 60m Perfusion (Tmax>6.0s) volume: 1524mMismatch Volume: 13674mnfarction Location:Right frontal operculum IMPRESSION: Embolic occlusion at the right carotid terminus. Minimal flow seen in the right MCA territory. Right ACA territory appears normally perfused, probably due to a patent anterior communicating artery. 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 Large penumbra, 136 cc in the remainder of the right MCA territory. No aortic atherosclerotic disease seen. No atherosclerotic disease at either carotid bifurcation. Some atherosclerotic narrowing of the distal vertebral arteries and basilar artery.  Preliminary report sent by text at 1238 hours. Discussed with stroke PA 1242 hours. Electronically Signed   By: MarNelson ChimesD.   On: 07/26/2020 12:48   CT HEAD CODE STROKE WO CONTRAST  Result Date: 07/26/2020 CLINICAL DATA:  Code stroke. Left-sided weakness. Right gaze disturbance. EXAM: CT HEAD WITHOUT CONTRAST TECHNIQUE: Contiguous axial images were obtained from the base of the skull through the vertex without intravenous contrast. COMPARISON:  07/09/2018 FINDINGS: Brain: Age related volume loss. Chronic small-vessel ischemic changes of the  brainstem, cerebellum and cerebral hemispheres. Physiologic basal ganglia calcification. No visible acute infarction, mass lesion, hemorrhage, hydrocephalus or extra-axial collection. Vascular: There is atherosclerotic calcification of the major vessels at the base of the brain. Skull: Negative Sinuses/Orbits: Clear/normal Other: None ASPECTS ([place] Stroke Program Early CT Score) - Ganglionic level infarction (caudate, lentiform nuclei, internal capsule, insula, M1-M3 cortex): 7 - Supraganglionic infarction (M4-M6 cortex): 3 Total score (0-10 with 10 being normal): 10 IMPRESSION: 1. No acute CT finding. Chronic small-vessel ischemic changes throughout the brain. Physiologic calcification of basal ganglia. 2. ASPECTS is 10 3. Discussed with Dr. Cheral Marker during interpretation at 12 17 hours. Electronically Signed   By: Nelson Chimes M.D.   On: 07/26/2020 12:22   CT ANGIO HEAD CODE STROKE  Result Date: 07/26/2020 CLINICAL DATA:  Acute presentation with left body weakness and speech disturbance. EXAM: CT ANGIOGRAPHY HEAD AND NECK CT PERFUSION BRAIN TECHNIQUE: Multidetector CT imaging of the head and neck was performed using the standard protocol during bolus administration of intravenous contrast. Multiplanar CT image reconstructions and MIPs were obtained to evaluate the vascular anatomy. Carotid stenosis measurements (when applicable) are obtained utilizing NASCET  criteria, using the distal internal carotid diameter as the denominator. Multiphase CT imaging of the brain was performed following IV bolus contrast injection. Subsequent parametric perfusion maps were calculated using RAPID software. CONTRAST:  Not annotated COMPARISON:  Head CT earlier same day FINDINGS: CTA NECK FINDINGS Aortic arch: No aortic atherosclerotic calcification seen. No aneurysm or dissection. Branching pattern is normal with wide patency of the origins. Right carotid system: Common carotid artery widely patent to the bifurcation. No atherosclerotic disease at the carotid bifurcation. ICA widely patent. Left carotid system: Common carotid artery widely patent to the bifurcation. Carotid bifurcation is normal without soft or calcified plaque. ICA widely patent. Vertebral arteries: Both vertebral artery origins are widely patent. Both vertebral arteries are normal through the cervical region to the foramen magnum. Skeleton: Ordinary cervical spondylosis. Other neck: No mass or lymphadenopathy. Upper chest: No active chest disease. 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 Review of the MIP images confirms the above findings CTA HEAD FINDINGS Anterior circulation: Complete occlusion of the right carotid terminus. Minimal flow visible in the right MCA territory. Right ACA receives it supply through the anterior communicating route. No left stenosis or occlusion. Right PCA arises from the carotid artery immediately proximal to the occlusion. Posterior circulation: Both vertebral arteries patent to the basilar. Atherosclerotic narrowing the distal vertebral arteries and of the basilar. Posterior circulation branch vessels are patent. Right PCA receives most of it supply from the anterior circulation. Venous sinuses: Patent and normal. Anatomic variants: None significant. Review of the MIP images confirms the above findings CT Brain  Perfusion Findings: ASPECTS: 10 CBF (<30%) Volume: 15m Perfusion (Tmax>6.0s) volume: 156mMismatch Volume: 13614mnfarction Location:Right frontal operculum IMPRESSION: Embolic occlusion at the right carotid terminus. Minimal flow seen in the right MCA territory. Right ACA territory appears normally perfused, probably due to a patent anterior communicating artery. 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 Large penumbra, 136 cc in the remainder of the right MCA territory. No aortic atherosclerotic disease seen. No atherosclerotic disease at either carotid bifurcation. Some atherosclerotic narrowing of the distal vertebral arteries and basilar artery. Preliminary report sent by text at 1238 hours. Discussed with stroke PA 1242 hours. Electronically Signed   By: MarNelson ChimesD.   On: 07/26/2020  12:48   CT ANGIO NECK CODE STROKE  Result Date: 07/26/2020 CLINICAL DATA:  Acute presentation with left body weakness and speech disturbance. EXAM: CT ANGIOGRAPHY HEAD AND NECK CT PERFUSION BRAIN TECHNIQUE: Multidetector CT imaging of the head and neck was performed using the standard protocol during bolus administration of intravenous contrast. Multiplanar CT image reconstructions and MIPs were obtained to evaluate the vascular anatomy. Carotid stenosis measurements (when applicable) are obtained utilizing NASCET criteria, using the distal internal carotid diameter as the denominator. Multiphase CT imaging of the brain was performed following IV bolus contrast injection. Subsequent parametric perfusion maps were calculated using RAPID software. CONTRAST:  Not annotated COMPARISON:  Head CT earlier same day FINDINGS: CTA NECK FINDINGS Aortic arch: No aortic atherosclerotic calcification seen. No aneurysm or dissection. Branching pattern is normal with wide patency of the origins. Right carotid system: Common carotid artery widely patent to the bifurcation. No atherosclerotic disease at the carotid bifurcation. ICA  widely patent. Left carotid system: Common carotid artery widely patent to the bifurcation. Carotid bifurcation is normal without soft or calcified plaque. ICA widely patent. Vertebral arteries: Both vertebral artery origins are widely patent. Both vertebral arteries are normal through the cervical region to the foramen magnum. Skeleton: Ordinary cervical spondylosis. Other neck: No mass or lymphadenopathy. Upper chest: No active chest disease. 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 Review of the MIP images confirms the above findings CTA HEAD FINDINGS Anterior circulation: Complete occlusion of the right carotid terminus. Minimal flow visible in the right MCA territory. Right ACA receives it supply through the anterior communicating route. No left stenosis or occlusion. Right PCA arises from the carotid artery immediately proximal to the occlusion. Posterior circulation: Both vertebral arteries patent to the basilar. Atherosclerotic narrowing the distal vertebral arteries and of the basilar. Posterior circulation branch vessels are patent. Right PCA receives most of it supply from the anterior circulation. Venous sinuses: Patent and normal. Anatomic variants: None significant. Review of the MIP images confirms the above findings CT Brain Perfusion Findings: ASPECTS: 10 CBF (<30%) Volume: 16m Perfusion (Tmax>6.0s) volume: 1566mMismatch Volume: 13612mnfarction Location:Right frontal operculum IMPRESSION: Embolic occlusion at the right carotid terminus. Minimal flow seen in the right MCA territory. Right ACA territory appears normally perfused, probably due to a patent anterior communicating artery. 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 Large penumbra, 136 cc in the remainder of the right MCA territory. No aortic atherosclerotic disease seen. No atherosclerotic disease at either carotid bifurcation. Some atherosclerotic narrowing of  the distal vertebral arteries and basilar artery. Preliminary report sent by text at 1238 hours. Discussed with stroke PA 1242 hours. Electronically Signed   By: MarNelson ChimesD.   On: 07/26/2020 12:48    Procedures Procedures (including critical care time) CRITICAL CARE Performed by: MarCharlesetta ShanksTotal critical care time: 15 minutes  Critical care time was exclusive of separately billable procedures and treating other patients.  Critical care was necessary to treat or prevent imminent or life-threatening deterioration.  Critical care was time spent personally by me on the following activities: development of treatment plan with patient and/or surrogate as well as nursing, discussions with consultants, evaluation of patient's response to treatment, examination of patient, obtaining history from patient or surrogate, ordering and performing treatments and interventions, ordering and review of laboratory studies, ordering and review of radiographic studies, pulse oximetry and re-evaluation of patient's condition. Medications Ordered  in ED Medications  aspirin 81 MG chewable tablet (has no administration in time range)  verapamil (ISOPTIN) 2.5 MG/ML injection (has no administration in time range)  ticagrelor (BRILINTA) 90 MG tablet (has no administration in time range)  clopidogrel (PLAVIX) 300 MG tablet (has no administration in time range)  nitroGLYCERIN 100 mcg/mL intra-arterial injection (has no administration in time range)  tirofiban (AGGRASTAT) 5-0.9 MG/100ML-% injection (has no administration in time range)  eptifibatide (INTEGRILIN) 20 MG/10ML injection (has no administration in time range)  ceFAZolin (ANCEF) 2-4 GM/100ML-% IVPB (has no administration in time range)  diltiazem (CARDIZEM) 125 mg in dextrose 5% 125 mL (1 mg/mL) infusion (has no administration in time range)  labetalol (NORMODYNE) injection 20 mg (20 mg Intravenous Given 07/26/20 1243)  alteplase (ACTIVASE) 1 mg/mL  infusion 52.5 mg (0 mg Intravenous Stopped 07/26/20 1350)    Followed by  0.9 %  sodium chloride infusion (50 mLs Intravenous New Bag/Given 07/26/20 1350)  iohexol (OMNIPAQUE) 350 MG/ML injection 100 mL (100 mLs Intravenous Contrast Given 07/26/20 1253)  metoprolol tartrate (LOPRESSOR) 5 MG/5ML injection (  Override pull for Anesthesia 07/26/20 1338)    ED Course  I have reviewed the triage vital signs and the nursing notes.  Pertinent labs & imaging results that were available during my care of the patient were reviewed by me and considered in my medical decision making (see chart for details).    MDM Rules/Calculators/A&P                         Consult: Patient is seen as code stroke seen by neurology.  Patient arrives with severe deficit of complete aphasia and gaze deviation with unilateral flaccid paralysis.  Embolic occlusion of right carotid terminus identified with large right MCA stroke identified.  Neurology managing thrombolysis and thrombectomy.  Patient's airway is intact.  Patient has A. fib RVR.  Blood pressure is borderline hypertensive to normotensive.  Patient started on thrombolysis and admitted for interventional management of embolic CVA.  Final Clinical Impression(s) / ED Diagnoses Final diagnoses:  Stroke [facility])    Rx / [place] Orders ED Discharge Orders    None       Charlesetta Shanks, MD 07/26/20 1413

## 2020-07-26 NOTE — Progress Notes (Signed)
Bedside report/groin assessed with PACU RN

## 2020-07-26 NOTE — ED Triage Notes (Signed)
Patient from home with [facility] after being found on the floor in the kitchen of her home by family, last known well 0930 today. Left sided paralysis.

## 2020-07-26 NOTE — Progress Notes (Signed)
PHARMACIST CODE STROKE RESPONSE  Notified to mix tPA at 1246 by Dr. Otelia Limes Delivered tPA to RN at 1247  tPA dose = 5.3mg  bolus over 1 minute followed by 47.3mg  for a total dose of 52.6mg  over 1 hour  Issues/delays encountered (if applicable): Official LKW time  and medication hx from husband. Pt had not been taking medications for ~ 1 week and husband saw normal at 0930.   Joaquim Lai PharmD. BCPS  07/26/20 12:51 PM

## 2020-07-26 NOTE — Code Documentation (Signed)
Patient presented to the hospital via [facility].  She has a history of stroke and hypertension.  She was last seen by her husband at 0930 and she was later found at 10 laying on the floor.  She had right gaze, left sided weakness, and was mute.  Patient was taken to CT right away upon arrival.  She was given TPA and was taken to IR for thrombectomy.  Patient has a history of previous pontine stroke, htn, and presented to the hospital in afib RVR.  Patient was transported to IR with no complications.  Her NIHSS was 24 on arrival.

## 2020-07-26 NOTE — Transfer of Care (Signed)
Immediate Anesthesia Transfer of Care Note  Patient: Samantha Paul  Procedure(s) Performed: IR WITH ANESTHESIA (N/A )  Patient Location: PACU  Anesthesia Type:General  Level of Consciousness: Patient remains intubated per anesthesia plan  Airway & Oxygen Therapy: Patient remains intubated per anesthesia plan  Post-op Assessment: Report given to RN and Post -op Vital signs reviewed and stable  Post vital signs: Reviewed and stable  Last Vitals:  Vitals Value Taken Time  BP    Temp    Pulse    Resp    SpO2      Last Pain:  Vitals:   07/26/20 1240  TempSrc: Oral         Complications: No complications documented.

## 2020-07-26 NOTE — Anesthesia Preprocedure Evaluation (Addendum)
Anesthesia Evaluation  Patient identified by MRN, date of birth, ID band  Reviewed: Allergy & Precautions, Patient's Chart, lab work & pertinent test resultsPreop documentation limited or incomplete due to emergent nature of procedure.  Airway Mallampati: II       Dental no notable dental hx.    Pulmonary neg pulmonary ROS,    Pulmonary exam normal        Cardiovascular hypertension, Pt. on home beta blockers + dysrhythmias Atrial Fibrillation  Rhythm:Irregular Rate:Tachycardia  ECG: a-fib, rate 169. Atrial fibrillation with rapid V-rate  ECHO: 1. Left ventricular ejection fraction, by estimation, is 65 to 70%. The left ventricle has normal function. Narrow LVOT (29mm at smallest diameter) Turbulent flow through LV /LVOT with no significant obstruction at rest. The left ventricle has no regional wall motion abnormalities. There is mild left ventricular hypertrophy. Left ventricular diastolic parameters are indeterminate.  2. Right ventricular systolic function is normal. The right ventricular size is normal. There is normal pulmonary artery systolic pressure.  3. The mitral valve mildly thickened with annular calcification. Mild mitral valve regurgitation.  4. The aortic valve is tricuspid. Aortic valve regurgitation is not visualized. Mild aortic valve stenosis.  5. The inferior vena cava is normal in size with greater than 50% respiratory variability, suggesting right atrial pressure of 3 mmHg   Neuro/Psych CVA    GI/Hepatic negative GI ROS, Neg liver ROS,   Endo/Other  diabetes, Insulin Dependent, Oral Hypoglycemic Agents  Renal/GU negative Renal ROS     Musculoskeletal Back pain   Abdominal   Peds  Hematology HLD   Anesthesia Other Findings CODE STROKE  Reproductive/Obstetrics                            Anesthesia Physical Anesthesia Plan  ASA: IV and emergent  Anesthesia Plan: General    Post-op Pain Management:    Induction: Intravenous  PONV Risk Score and Plan: 3 and Ondansetron, Dexamethasone and Treatment may vary due to age or medical condition  Airway Management Planned: Oral ETT  Additional Equipment:   Intra-op Plan:   Post-operative Plan: Possible Post-op intubation/ventilation  Informed Consent:     Only emergency history available  Plan Discussed with: CRNA  Anesthesia Plan Comments: (Brief discussion the the husband. Communication limited due to language barrier.)       Anesthesia Quick Evaluation

## 2020-07-26 NOTE — ED Notes (Signed)
Pt's CBG result was 201. Informed Clark - RN.

## 2020-07-27 ENCOUNTER — Inpatient Hospital Stay (HOSPITAL_COMMUNITY): Payer: Medicare Other

## 2020-07-27 ENCOUNTER — Encounter (HOSPITAL_COMMUNITY): Payer: Self-pay | Admitting: Radiology

## 2020-07-27 DIAGNOSIS — I6601 Occlusion and stenosis of right middle cerebral artery: Secondary | ICD-10-CM | POA: Diagnosis not present

## 2020-07-27 DIAGNOSIS — J9601 Acute respiratory failure with hypoxia: Secondary | ICD-10-CM

## 2020-07-27 LAB — CBC
HCT: 35.9 % — ABNORMAL LOW (ref 36.0–46.0)
Hemoglobin: 10.2 g/dL — ABNORMAL LOW (ref 12.0–15.0)
MCH: 21.3 pg — ABNORMAL LOW (ref 26.0–34.0)
MCHC: 28.4 g/dL — ABNORMAL LOW (ref 30.0–36.0)
MCV: 74.8 fL — ABNORMAL LOW (ref 80.0–100.0)
Platelets: 235 10*3/uL (ref 150–400)
RBC: 4.8 MIL/uL (ref 3.87–5.11)
RDW: 15.8 % — ABNORMAL HIGH (ref 11.5–15.5)
WBC: 11.5 10*3/uL — ABNORMAL HIGH (ref 4.0–10.5)
nRBC: 0 % (ref 0.0–0.2)

## 2020-07-27 LAB — GLUCOSE, CAPILLARY
Glucose-Capillary: 145 mg/dL — ABNORMAL HIGH (ref 70–99)
Glucose-Capillary: 107 mg/dL — ABNORMAL HIGH (ref 70–99)
Glucose-Capillary: 98 mg/dL (ref 70–99)
Glucose-Capillary: 89 mg/dL (ref 70–99)
Glucose-Capillary: 100 mg/dL — ABNORMAL HIGH (ref 70–99)

## 2020-07-27 LAB — BASIC METABOLIC PANEL
Anion gap: 8 (ref 5–15)
BUN: 19 mg/dL (ref 8–23)
CO2: 22 mmol/L (ref 22–32)
Calcium: 8.6 mg/dL — ABNORMAL LOW (ref 8.9–10.3)
Chloride: 108 mmol/L (ref 98–111)
Creatinine, Ser: 0.94 mg/dL (ref 0.44–1.00)
GFR calc Af Amer: >60 mL/min (ref >60)
GFR calc non Af Amer: >60 mL/min (ref >60)
Glucose, Bld: 110 mg/dL — ABNORMAL HIGH (ref 70–99)
Potassium: 4.6 mmol/L (ref 3.5–5.1)
Sodium: 138 mmol/L (ref 135–145)

## 2020-07-27 LAB — LIPID PANEL
Cholesterol: 147 mg/dL (ref 0–200)
HDL: 41 mg/dL (ref >40)
LDL Cholesterol: 90 mg/dL (ref 0–99)
Total CHOL/HDL Ratio: 3.6 RATIO
Triglycerides: 79 mg/dL (ref <150)
VLDL: 16 mg/dL (ref 0–40)

## 2020-07-27 LAB — URINALYSIS, ROUTINE W REFLEX MICROSCOPIC
Bilirubin Urine: NEGATIVE
Glucose, UA: 50 mg/dL — AB
Hgb urine dipstick: NEGATIVE
Ketones, ur: NEGATIVE mg/dL
Leukocytes,Ua: NEGATIVE
Nitrite: NEGATIVE
Protein, ur: NEGATIVE mg/dL
Specific Gravity, Urine: 1.029 (ref 1.005–1.030)
pH: 5 (ref 5.0–8.0)

## 2020-07-27 LAB — PHOSPHORUS: Phosphorus: 4.1 mg/dL (ref 2.5–4.6)

## 2020-07-27 LAB — RAPID URINE DRUG SCREEN, HOSP PERFORMED
Amphetamines: NOT DETECTED
Barbiturates: NOT DETECTED
Benzodiazepines: NOT DETECTED
Cocaine: NOT DETECTED
Opiates: NOT DETECTED
Tetrahydrocannabinol: NOT DETECTED

## 2020-07-27 LAB — MAGNESIUM
Magnesium: 2 mg/dL (ref 1.7–2.4)
Magnesium: 1.8 mg/dL (ref 1.7–2.4)

## 2020-07-27 LAB — HEMOGLOBIN A1C
Hgb A1c MFr Bld: 8.7 % — ABNORMAL HIGH (ref 4.8–5.6)
Mean Plasma Glucose: 202.99 mg/dL

## 2020-07-27 MED ORDER — PROSOURCE TF PO LIQD
45.0000 mL | Freq: Three times a day (TID) | ORAL | Status: DC
  Administered 2020-07-27 – 2020-07-31 (×12): 45 mL
  Filled 2020-07-27 (×12): qty 45

## 2020-07-27 MED ORDER — PANTOPRAZOLE SODIUM 40 MG PO PACK
40.0000 mg | PACK | Freq: Every day | ORAL | Status: DC
  Administered 2020-07-27 – 2020-08-08 (×13): 40 mg
  Filled 2020-07-27 (×13): qty 20

## 2020-07-27 MED ORDER — METOPROLOL TARTRATE 25 MG PO TABS
12.5000 mg | ORAL_TABLET | Freq: Two times a day (BID) | ORAL | Status: DC
  Administered 2020-07-28: 12.5 mg

## 2020-07-27 MED ORDER — PROSOURCE TF PO LIQD: 45.0000 mL | Freq: Two times a day (BID) | ORAL | Status: DC

## 2020-07-27 MED ORDER — VITAL HIGH PROTEIN PO LIQD: 1000.0000 mL | ORAL | Status: DC

## 2020-07-27 MED ORDER — OSMOLITE 1.2 CAL PO LIQD
1000.0000 mL | ORAL | Status: DC
  Administered 2020-07-28 – 2020-07-31 (×4): 1000 mL
  Filled 2020-07-27 (×5): qty 1000

## 2020-07-27 NOTE — Progress Notes (Signed)
OT Cancellation Note  Patient Details Name: Samantha Paul MRN: [id number] DOB: June 25, 1946   Cancelled Treatment:    Reason Eval/Treat Not Completed: Medical issues which prohibited therapy. Spoke RN and she reports pt may be extubated today and even minimal activity pt's HR goes into 140s. Will hold for today and check on tomorrow.  Ignacia Palma, OTR/L Acute Rehab Services Pager [phone] Office [phone]     Evette Georges 07/27/2020, 8:46 AM

## 2020-07-27 NOTE — Progress Notes (Signed)
SLP Cancellation Note  Patient Details Name: Samantha Paul MRN: [id number] DOB: 04/25/1946   Cancelled treatment:       Reason Eval/Treat Not Completed: Medical issues which prohibited therapy (pt on vent this am). Will f/u as able.    Mahala Menghini., M.A. CCC-SLP Acute Rehabilitation Services Pager [phone] Office [phone]  07/27/2020, 7:25 AM

## 2020-07-27 NOTE — Progress Notes (Addendum)
PT Cancellation Note  Patient Details Name: Samantha Paul MRN: [id number] DOB: Jul 30, 1946   Cancelled Treatment:    Reason Eval/Treat Not Completed: Active bedrest order Per RN, pt with elevated HR in 130s at rest, not weaning great. Will follow up.   [name] 07/27/2020, 6:59 AM [place], PT, DPT Acute Rehabilitation Services Pager [phone] Office [phone]

## 2020-07-27 NOTE — Progress Notes (Addendum)
 NAME:  Samantha Paul, MRN:  [id number], DOB:  03/18/1946, LOS: 1 ADMISSION DATE:  07/26/2020, CONSULTATION DATE:  07/26/20 REFERRING MD:  Dr. Deveshwar, CHIEF COMPLAINT:  L sided weakness   Brief History   74 y/o F admitted on 8/1 with R ACA/MCA occulusion with L sided hemiparesis and aphasia. s/p Neuro IR Revascularization.    Significant Hospital Events   8/1: Admit with R MCA / ACA occlusion s/p revascularization  8/2: O/N Hypotensive Phenylephrine IV started    Consults: date of consult/date signed off & final recs:  07/26/20: PCCM Procedures (surgical and bedside):  8/1 >> ETT  8/1 >> L Radial Aline Significant Diagnostic Tests:   ECHO 07/03/20 >> LVEF 65-70%, narrow LVOT, turbulent flow through the LV/LVOT, no RWMA, mild LVH, RV systolic function normal, normal PASP.  CT Code Stroke 8/1 >> no acute CT finding, chronic small vessel ischemic changes  CTA Head / Neck 8/1 >> embolic occlusion at the right carotid terminus. Minimal flow seen in the right MCA territory. Right ACA appears normally perfused, probably due to a patent anterior communicating artery.  Core infarct right frontal operculum 16cc, large penumbra, 136 cc in the remainder of the R MCA territory. No atherosclerotic disease at either carotid bifurcation.    CT Head 8/2: >>New mass effect in the anterior right frontal lobe with regional   increasing mixed hypo- and hyperdensity. Favor a combination of   anterior right MCA territory cytotoxic edema and petechial hemorrhage. No malignant   hemorrhage.   Subjective:  Ms. Jonsson was seen and evaluated at bedside this morning. Her husband and son were at bedside. She was able to follow commands this morning, but did not speak with the healthcare team.   Objective   Blood pressure 120/76, pulse (!) 119, temperature 97.6 F (36.4 C), temperature source Axillary, resp. rate 12, height 5' 2" (1.575 m), weight 60.7 kg, SpO2 100 %.    Vent Mode: PRVC FiO2 (%):  [40 %-100 %] 40 % Set  Rate:  [16 bmp-18 bmp] 16 bmp Vt Set:  [400 mL-420 mL] 400 mL PEEP:  [5 cmH20] 5 cmH20 Plateau Pressure:  [14 cmH20-19 cmH20] 15 cmH20   Intake/Output Summary (Last 24 hours) at 07/27/2020 0802 Last data filed at 07/27/2020 0800 Gross per 24 hour  Intake 2419.54 ml  Output 875 ml  Net 1544.54 ml   Filed Weights   07/26/20 1245 07/26/20 1736  Weight: 58.4 kg 60.7 kg    Examination: Physical Exam Constitutional:      Comments: Opens eyes to voice, able to follow commands.   HENT:     Head: Normocephalic and atraumatic.     Comments: ET/NG tubes intact.     Nose: Nose normal.  Eyes:     Pupils: Pupils are equal, round, and reactive to light.  Cardiovascular:     Rate and Rhythm: Rhythm irregular.     Heart sounds: Normal heart sounds. No murmur heard.  No gallop.      Comments: Pulse irregularly irregular.  Pulmonary:     Breath sounds: Normal breath sounds. No wheezing, rhonchi or rales.  Abdominal:     General: Bowel sounds are normal.     Palpations: Abdomen is soft.  Musculoskeletal:     Comments: Moves RUE and RLE sporadically, able to intermittently follow commands. Unable to lift LLE/LUE against gravity.   Neurological:     Mental Status: She is alert.     Resolved Hospital Problem list      Assessment & Plan:  R ACA / MCA Occlusion / Acute CVA S/p revascularization, large penumbra noted on initial CT imaging -SBP goal 120-140 - Crestor 20 mg QD - Recommendations per Neuro - Follow Up Head CT  - ECHO   Acute Respiratory Insufficiency in setting of Acute CVA -PRVC 8cc/kg, rate 16 -Wean PEEP / FiO2 for sats > 90% -daily SBT  -follow intermittent CXR  -ABG pH: 7.37, CO2 21.1, Bicarb 17.8  Need for Sedation on Mechanical Ventilation  -PAD protocol with fentanyl gtt, PRN versed for RASS -1 to -2  Atrial Fibrillation  On cardizem, metoprolol, eliquis prior to admit, CHADS-VASC 6 -no anticoagulation / rate control for now with risk of hemorrhagic conversion   - tele monitoring  - Continue Diltiazem  - Continue Lopressor  Hypotension:  - Phenylephrine to meet SBP goal of 120-140  Microcytic Anemia  - QD CBC - Hgb 10.2<12.9  DM II with Hyperglycemia  -SSI  - A1c 8.7  Disposition / Summary of Today's Plan 07/27/20   Plan on CT with follow up MRI, possible extubation pending imaging results and mentation.     Nutritional status and diet: NPO Pain/Anxiety/Delirium reduction: PAD VAP protocol (if indicated) Yes DVT prophylaxis: SCDs GI prophylaxis: PPI Hyperglycemia protocol: SSI Mobility:Bed bound Code Status: FULL Family Communication: Per primary   Labs and Ancillary Testing (personally reviewed)  CBC: CBC Latest Ref Rng & Units 07/27/2020 07/26/2020 07/26/2020  WBC 4.0 - 10.5 K/uL 11.5(H) - 6.5  Hemoglobin 12.0 - 15.0 g/dL 10.2(L) 12.9 10.7(L)  Hematocrit 36 - 46 % 35.9(L) 38.0 37.7  Platelets 150 - 400 K/uL 235 - 185    Chemistry: CMP Latest Ref Rng & Units 07/27/2020 07/26/2020 07/26/2020  Glucose 70 - 99 mg/dL 110(H) 198(H) 203(H)  BUN 8 - 23 mg/dL 19 25(H) 22  Creatinine 0.44 - 1.00 mg/dL 0.94 1.10(H) 1.17(H)  Sodium 135 - 145 mmol/L 138 141 137  Potassium 3.5 - 5.1 mmol/L 4.6 4.5 4.6  Chloride 98 - 111 mmol/L 108 106 106  CO2 22 - 32 mmol/L 22 - 19(L)  Calcium 8.9 - 10.3 mg/dL 8.6(L) - 9.2  Total Protein 6.5 - 8.1 g/dL - - 6.8  Total Bilirubin 0.3 - 1.2 mg/dL - - 1.4(H)  Alkaline Phos 38 - 126 U/L - - 90  AST 15 - 41 U/L - - 20  ALT 0 - 44 U/L - - 16   ABG:    Component Value Date/Time   PHART 7.373 07/26/2020 1720   PCO2ART 31.1 (L) 07/26/2020 1720   PO2ART 210 (H) 07/26/2020 1720   HCO3 17.8 (L) 07/26/2020 1720   TCO2 21 (L) 07/26/2020 1215   ACIDBASEDEF 6.6 (H) 07/26/2020 1720   O2SAT 99.4 07/26/2020 1720    CBG: CBG (last 3)  Recent Labs    07/27/20 0327 07/27/20 0748 07/27/20 1118  GLUCAP 145* 107* 98     Microbiology:  8/1>>MRSA PCR Screening: Negative  8/1>> SARS Coronavirus:  Negative  Review of Systems:   N/A as patient is on mechanical ventilator  Past medical history  She,  has a past medical history of Afib ([place]) (06/2020), Back pain, CVA (cerebral vascular accident) ([place]) (07/26/2020), DM II (diabetes mellitus, type II), controlled ([place]), HTN (hypertension), Knee pain, and Stroke ([place]) (?).   Surgical History    Past Surgical History:  Procedure Laterality Date  . CHOLECYSTECTOMY N/A 06/30/2020   Procedure: LAPAROSCOPIC CHOLECYSTECTOMY;  Surgeon: Georganna Skeans, MD;  Location: [facility];  Service: General;  Laterality: N/A;     Social History   Social History   Socioeconomic History  . Marital status: Married    Spouse name: Not on file  . Number of children: Not on file  . Years of education: Not on file  . Highest education level: Not on file  Occupational History  . Not on file  Tobacco Use  . Smoking status: Never Smoker  . Smokeless tobacco: Never Used  Substance and Sexual Activity  . Alcohol use: Not Currently  . Drug use: Not Currently  . Sexual activity: Not on file  Other Topics Concern  . Not on file  Social History Narrative  . Not on file   Social Determinants of Health   Financial Resource Strain:   . Difficulty of Paying Living Expenses:   Food Insecurity:   . Worried About Running Out of Food in the Last Year:   . Ran Out of Food in the Last Year:   Transportation Needs:   . Lack of Transportation (Medical):   . Lack of Transportation (Non-Medical):   Physical Activity:   . Days of Exercise per Week:   . Minutes of Exercise per Session:   Stress:   . Feeling of Stress :   Social Connections:   . Frequency of Communication with Friends and Family:   . Frequency of Social Gatherings with Friends and Family:   . Attends Religious Services:   . Active Member of Clubs or Organizations:   . Attends Club or Organization Meetings:   . Marital Status:   Intimate Partner Violence:   . Fear of Current or Ex-Partner:   .  Emotionally Abused:   . Physically Abused:   . Sexually Abused:   ,  reports that she has never smoked. She has never used smokeless tobacco. She reports previous alcohol use. She reports previous drug use.   Family history   Her family history includes Arthritis in her brother.   Allergies Allergies  Allergen Reactions  . Metformin And Related     Abdominal pain and diarrhea    Home meds  Prior to Admission medications   Medication Sig Start Date End Date Taking? Authorizing Provider  acetaminophen (TYLENOL) 325 MG tablet Take 2-3 tablets (650-975 mg total) by mouth every 6 (six) hours as needed for mild pain. 07/03/20   Simaan, Elizabeth S, PA-C  apixaban (ELIQUIS) 5 MG TABS tablet Take 1 tablet (5 mg total) by mouth 2 (two) times daily. 07/03/20   Lama, Gagan S, MD  blood glucose meter kit and supplies KIT Dispense based on patient and insurance preference. Use up to four times daily as directed. (FOR ICD-10--E11.9). 07/25/18   Love, Pamela S, PA-C  diltiazem (CARTIA XT) 240 MG 24 hr capsule Take 1 capsule (240 mg total) by mouth daily. 07/03/20 07/03/21  Lama, Gagan S, MD  insulin glargine (LANTUS) 100 UNIT/ML Solostar Pen Inject 20 Units into the skin daily. 07/03/20   Lama, Gagan S, MD  Insulin Pen Needle (PEN NEEDLES) 32G X 4 MM MISC Use as directed with insulin pen 07/03/20   Lama, Gagan S, MD  metFORMIN (GLUCOPHAGE) 500 MG tablet Take 1 tablet (500 mg total) by mouth 2 (two) times daily with a meal. 07/03/20 07/03/21  Lama, Gagan S, MD  metoprolol tartrate (LOPRESSOR) 25 MG tablet Take 0.5 tablets (12.5 mg total) by mouth 2 (two) times daily. 07/03/20   Lama, Gagan S, MD  oxyCODONE (OXY IR/ROXICODONE) 5 MG immediate release tablet Take 1   tablet (5 mg total) by mouth every 6 (six) hours as needed for moderate pain or severe pain (pain not releived by tylenol or ibuprofen). 07/03/20   Jill Alexanders, PA-C  rosuvastatin (CRESTOR) 20 MG tablet TAKE 1 TABLET(20 MG) BY MOUTH DAILY 12/20/18   Frann Rider, NP  senna-docusate (SENOKOT-S) 8.6-50 MG tablet Take 2 tablets by mouth at bedtime. 07/25/18   Bary Leriche, PA-C     Maudie Mercury, MD IMTS, PGY-2 Pager: [phone] 07/27/2020,3:53 PM

## 2020-07-27 NOTE — Progress Notes (Signed)
STROKE TEAM PROGRESS NOTE   INTERVAL HISTORY Her husband and their son are at the bedside.  RN called the interpretor on the phone for Dr. Pearlean Brownie to discuss her dx, prognosis and treatment, patient remains intubated but is arousable and follows simple commands on the right side after translation by her husband. She has right gaze preference and does not blink to threat on the left and has left hemiplegia Follow-up CT scan during rounds shows moderate size right MCA infarct with cytotoxic edema and mild midline shift. She remains in A. fib with rapid ventricular rate and is on Cardizem drip Vitals:   07/27/20 0800 07/27/20 0817 07/27/20 0900 07/27/20 0911  BP: (!) 119/53  (!) 133/95 (!) 133/95  Pulse: (!) 119  95   Resp: 12  16   Temp: 98.5 F (36.9 C)     TempSrc: Axillary     SpO2: 100% 100% 100%   Weight:      Height:       CBC:  Recent Labs  Lab 07/26/20 1211 07/26/20 1211 07/26/20 1215 07/27/20 0637  WBC 6.5  --   --  11.5*  NEUTROABS 4.1  --   --   --   HGB 10.7*   < > 12.9 10.2*  HCT 37.7   < > 38.0 35.9*  MCV 75.1*  --   --  74.8*  PLT 185  --   --  235   < > = values in this interval not displayed.   Basic Metabolic Panel:  Recent Labs  Lab 07/26/20 1211 07/26/20 1211 07/26/20 1215 07/27/20 0637  NA 137   < > 141 138  K 4.6   < > 4.5 4.6  CL 106   < > 106 108  CO2 19*  --   --  22  GLUCOSE 203*   < > 198* 110*  BUN 22   < > 25* 19  CREATININE 1.17*   < > 1.10* 0.94  CALCIUM 9.2  --   --  8.6*  MG  --   --   --  2.0   < > = values in this interval not displayed.   Lipid Panel:  Recent Labs  Lab 07/27/20 0637  CHOL 147  TRIG 79  HDL 41  CHOLHDL 3.6  VLDL 16  LDLCALC 90   Lab Results  Component Value Date   HGBA1C 12.7 (H) 06/29/2020   Urine Drug Screen: No results for input(s): LABOPIA, COCAINSCRNUR, LABBENZ, AMPHETMU, THCU, LABBARB in the last 168 hours.  Alcohol Level  Recent Labs  Lab 07/26/20 1209  ETH <10    IMAGING past 24 hours CT  HEAD WO CONTRAST  Result Date: 07/26/2020 CLINICAL DATA:  Stroke, follow-up post revascularization. EXAM: CT HEAD WITHOUT CONTRAST TECHNIQUE: Contiguous axial images were obtained from the base of the skull through the vertex without intravenous contrast. COMPARISON:  CT angiogram head/neck and CT perfusion 07/26/2020, noncontrast head CT 07/26/2020 FINDINGS: Brain: Redemonstrated mineralization within the bilateral basal ganglia. There is subtle loss of gray-white differentiation within the right insula consistent with acute ischemic infarction. Additionally, there is subtle asymmetric hyperdensity of the right caudate and lentiform nuclei which may reflect contrast staining/enhancement at site of acute infarction. No convincing evidence of acute intracranial hemorrhage. Stable background generalized parenchymal atrophy and chronic small vessel ischemic disease. No extra-axial fluid collection. No evidence of intracranial mass. No midline shift. Vascular: Residual circulating contrast material limits evaluation for hyperdense vessels Skull: Normal. Negative for fracture  or focal lesion. Sinuses/Orbits: Visualized orbits show no acute finding. Mild ethmoid and maxillary sinus mucosal thickening at the imaged levels. No significant mastoid effusion IMPRESSION: Subtle changes of acute ischemic infarction within the right insula. Additionally, there is subtle asymmetric hyperdensity of the right caudate and lentiform nuclei which may reflect contrast staining or enhancement at site of acute infarction. No convincing evidence of acute intracranial hemorrhage. Stable background generalized parenchymal atrophy and chronic small vessel ischemic disease. Electronically Signed   By: Jackey Loge DO   On: 07/26/2020 15:47   CT CEREBRAL PERFUSION W CONTRAST  Result Date: 07/26/2020 CLINICAL DATA:  Acute presentation with left body weakness and speech disturbance. EXAM: CT ANGIOGRAPHY HEAD AND NECK CT PERFUSION BRAIN  TECHNIQUE: Multidetector CT imaging of the head and neck was performed using the standard protocol during bolus administration of intravenous contrast. Multiplanar CT image reconstructions and MIPs were obtained to evaluate the vascular anatomy. Carotid stenosis measurements (when applicable) are obtained utilizing NASCET criteria, using the distal internal carotid diameter as the denominator. Multiphase CT imaging of the brain was performed following IV bolus contrast injection. Subsequent parametric perfusion maps were calculated using RAPID software. CONTRAST:  Not annotated COMPARISON:  Head CT earlier same day FINDINGS: CTA NECK FINDINGS Aortic arch: No aortic atherosclerotic calcification seen. No aneurysm or dissection. Branching pattern is normal with wide patency of the origins. Right carotid system: Common carotid artery widely patent to the bifurcation. No atherosclerotic disease at the carotid bifurcation. ICA widely patent. Left carotid system: Common carotid artery widely patent to the bifurcation. Carotid bifurcation is normal without soft or calcified plaque. ICA widely patent. Vertebral arteries: Both vertebral artery origins are widely patent. Both vertebral arteries are normal through the cervical region to the foramen magnum. Skeleton: Ordinary cervical spondylosis. Other neck: No mass or lymphadenopathy. Upper chest: No active chest disease. 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 Review of the MIP images confirms the above findings CTA HEAD FINDINGS Anterior circulation: Complete occlusion of the right carotid terminus. Minimal flow visible in the right MCA territory. Right ACA receives it supply through the anterior communicating route. No left stenosis or occlusion. Right PCA arises from the carotid artery immediately proximal to the occlusion. Posterior circulation: Both vertebral arteries patent to the basilar.  Atherosclerotic narrowing the distal vertebral arteries and of the basilar. Posterior circulation branch vessels are patent. Right PCA receives most of it supply from the anterior circulation. Venous sinuses: Patent and normal. Anatomic variants: None significant. Review of the MIP images confirms the above findings CT Brain Perfusion Findings: ASPECTS: 10 CBF (<30%) Volume: 80mL Perfusion (Tmax>6.0s) volume: Mismatch Volume: Infarction Location:Right frontal operculum IMPRESSION: Embolic occlusion at the right carotid terminus. Minimal flow seen in the right MCA territory. Right ACA territory appears normally perfused, probably due to a patent anterior communicating artery. 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 Large penumbra, 136 cc in the remainder of the right MCA territory. No aortic atherosclerotic disease seen. No atherosclerotic disease at either carotid bifurcation. Some atherosclerotic narrowing of the distal vertebral arteries and basilar artery. Preliminary report sent by text at 1238 hours. Discussed with stroke PA 1242 hours. Electronically Signed   By: Paulina Fusi M.D.   On: 07/26/2020 12:48   DG Chest Port 1 View  Result Date: 07/27/2020 CLINICAL DATA:  History of stroke and hypertension EXAM: PORTABLE CHEST 1 VIEW COMPARISON:  07/26/2020 FINDINGS:  Endotracheal and enteric tubes are unchanged in position. Shallow inspiration. Cardiac enlargement. No vascular congestion or edema. Lungs are clear with improvement since prior study. No effusions. Surgical clips in the right upper quadrant. IMPRESSION: Cardiac enlargement. No evidence of active pulmonary disease. Improvement since prior study. Electronically Signed   By: Burman NievesWilliam  Stevens M.D.   On: 07/27/2020 06:35   DG CHEST PORT 1 VIEW  Result Date: 07/26/2020 CLINICAL DATA:  Inhibition EXAM: PORTABLE CHEST 1 VIEW COMPARISON:  Chest x-ray dated 06/29/2020. FINDINGS: Endotracheal tube appears well positioned with tip  approximately 3 cm above the carina. Enteric tube passes below the diaphragm. Cardiomegaly. Central pulmonary vascular congestion. No pleural effusion or pneumothorax is seen. IMPRESSION: 1. Endotracheal tube appears well positioned with tip approximately 3 cm above the carina. 2. Cardiomegaly with central pulmonary vascular congestion indicating CHF/volume overload. Electronically Signed   By: Bary RichardStan  Maynard M.D.   On: 07/26/2020 16:58   DG Abd Portable 1V  Result Date: 07/26/2020 CLINICAL DATA:  OG tube placement EXAM: PORTABLE ABDOMEN - 1 VIEW COMPARISON:  None. FINDINGS: Enteric tube appears well positioned in the stomach. Visualized bowel gas pattern is nonobstructive. Cholecystectomy clips in the RIGHT upper quadrant. IMPRESSION: Enteric tube appears well positioned in the stomach. Electronically Signed   By: Bary RichardStan  Maynard M.D.   On: 07/26/2020 16:57   CT HEAD CODE STROKE WO CONTRAST  Result Date: 07/26/2020 CLINICAL DATA:  Code stroke. Left-sided weakness. Right gaze disturbance. EXAM: CT HEAD WITHOUT CONTRAST TECHNIQUE: Contiguous axial images were obtained from the base of the skull through the vertex without intravenous contrast. COMPARISON:  07/09/2018 FINDINGS: Brain: Age related volume loss. Chronic small-vessel ischemic changes of the brainstem, cerebellum and cerebral hemispheres. Physiologic basal ganglia calcification. No visible acute infarction, mass lesion, hemorrhage, hydrocephalus or extra-axial collection. Vascular: There is atherosclerotic calcification of the major vessels at the base of the brain. Skull: Negative Sinuses/Orbits: Clear/normal Other: None ASPECTS (Alberta Stroke Program Early CT Score) - Ganglionic level infarction (caudate, lentiform nuclei, internal capsule, insula, M1-M3 cortex): 7 - Supraganglionic infarction (M4-M6 cortex): 3 Total score (0-10 with 10 being normal): 10 IMPRESSION: 1. No acute CT finding. Chronic small-vessel ischemic changes throughout the brain.  Physiologic calcification of basal ganglia. 2. ASPECTS is 10 3. Discussed with Dr. Otelia LimesLindzen during interpretation at 12 17 hours. Electronically Signed   By: Paulina FusiMark  Shogry M.D.   On: 07/26/2020 12:22   CT ANGIO HEAD CODE STROKE  Result Date: 07/26/2020 CLINICAL DATA:  Acute presentation with left body weakness and speech disturbance. EXAM: CT ANGIOGRAPHY HEAD AND NECK CT PERFUSION BRAIN TECHNIQUE: Multidetector CT imaging of the head and neck was performed using the standard protocol during bolus administration of intravenous contrast. Multiplanar CT image reconstructions and MIPs were obtained to evaluate the vascular anatomy. Carotid stenosis measurements (when applicable) are obtained utilizing NASCET criteria, using the distal internal carotid diameter as the denominator. Multiphase CT imaging of the brain was performed following IV bolus contrast injection. Subsequent parametric perfusion maps were calculated using RAPID software. CONTRAST:  Not annotated COMPARISON:  Head CT earlier same day FINDINGS: CTA NECK FINDINGS Aortic arch: No aortic atherosclerotic calcification seen. No aneurysm or dissection. Branching pattern is normal with wide patency of the origins. Right carotid system: Common carotid artery widely patent to the bifurcation. No atherosclerotic disease at the carotid bifurcation. ICA widely patent. Left carotid system: Common carotid artery widely patent to the bifurcation. Carotid bifurcation is normal without soft or calcified plaque. ICA widely patent. Vertebral  arteries: Both vertebral artery origins are widely patent. Both vertebral arteries are normal through the cervical region to the foramen magnum. Skeleton: Ordinary cervical spondylosis. Other neck: No mass or lymphadenopathy. Upper chest: No active chest disease. 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 Review of the MIP images confirms the above findings CTA  HEAD FINDINGS Anterior circulation: Complete occlusion of the right carotid terminus. Minimal flow visible in the right MCA territory. Right ACA receives it supply through the anterior communicating route. No left stenosis or occlusion. Right PCA arises from the carotid artery immediately proximal to the occlusion. Posterior circulation: Both vertebral arteries patent to the basilar. Atherosclerotic narrowing the distal vertebral arteries and of the basilar. Posterior circulation branch vessels are patent. Right PCA receives most of it supply from the anterior circulation. Venous sinuses: Patent and normal. Anatomic variants: None significant. Review of the MIP images confirms the above findings CT Brain Perfusion Findings: ASPECTS: 10 CBF (<30%) Volume: 31mL Perfusion (Tmax>6.0s) volume: Mismatch Volume: Infarction Location:Right frontal operculum IMPRESSION: Embolic occlusion at the right carotid terminus. Minimal flow seen in the right MCA territory. Right ACA territory appears normally perfused, probably due to a patent anterior communicating artery. 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 Large penumbra, 136 cc in the remainder of the right MCA territory. No aortic atherosclerotic disease seen. No atherosclerotic disease at either carotid bifurcation. Some atherosclerotic narrowing of the distal vertebral arteries and basilar artery. Preliminary report sent by text at 1238 hours. Discussed with stroke PA 1242 hours. Electronically Signed   By: Paulina Fusi M.D.   On: 07/26/2020 12:48   CT ANGIO NECK CODE STROKE  Result Date: 07/26/2020 CLINICAL DATA:  Acute presentation with left body weakness and speech disturbance. EXAM: CT ANGIOGRAPHY HEAD AND NECK CT PERFUSION BRAIN TECHNIQUE: Multidetector CT imaging of the head and neck was performed using the standard protocol during bolus administration of intravenous contrast. Multiplanar CT image reconstructions and MIPs were obtained to evaluate  the vascular anatomy. Carotid stenosis measurements (when applicable) are obtained utilizing NASCET criteria, using the distal internal carotid diameter as the denominator. Multiphase CT imaging of the brain was performed following IV bolus contrast injection. Subsequent parametric perfusion maps were calculated using RAPID software. CONTRAST:  Not annotated COMPARISON:  Head CT earlier same day FINDINGS: CTA NECK FINDINGS Aortic arch: No aortic atherosclerotic calcification seen. No aneurysm or dissection. Branching pattern is normal with wide patency of the origins. Right carotid system: Common carotid artery widely patent to the bifurcation. No atherosclerotic disease at the carotid bifurcation. ICA widely patent. Left carotid system: Common carotid artery widely patent to the bifurcation. Carotid bifurcation is normal without soft or calcified plaque. ICA widely patent. Vertebral arteries: Both vertebral artery origins are widely patent. Both vertebral arteries are normal through the cervical region to the foramen magnum. Skeleton: Ordinary cervical spondylosis. Other neck: No mass or lymphadenopathy. Upper chest: No active chest disease. 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 Review of the MIP images confirms the above findings CTA HEAD FINDINGS Anterior circulation: Complete occlusion of the right carotid terminus. Minimal flow visible in the right MCA territory. Right ACA receives it supply through the anterior communicating route. No left stenosis or occlusion. Right PCA arises from the carotid artery immediately proximal to the occlusion. Posterior circulation: Both vertebral arteries patent to  the basilar. Atherosclerotic narrowing the distal vertebral arteries and of the basilar. Posterior circulation branch vessels are patent. Right PCA receives most of it supply from the anterior circulation. Venous sinuses: Patent and normal.  Anatomic variants: None significant. 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 Minimal flow seen in the right MCA territory. Right ACA territory appears normally perfused, probably due to a patent anterior communicating artery. 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 Large penumbra, 136 cc in the remainder of the right MCA territory. No aortic atherosclerotic disease seen. No atherosclerotic disease at either carotid bifurcation. Some atherosclerotic narrowing of the distal vertebral arteries and basilar artery. Preliminary report sent by text at 1238 hours. Discussed with stroke PA 1242 hours. Electronically Signed   By: Paulina Fusi M.D.   On: 07/26/2020 12:48    PHYSICAL EXAM Elderly Asian lady who is intubated sedated. Not in distress. . Afebrile. Head is nontraumatic. Neck is supple without bruit.    Cardiac exam no murmur or gallop. Lungs are clear to auscultation. Distal pulses are well felt. Neurological Exam :  Patient is intubated sedated. Eyes open. She follows some simple midline and commands on the right side after translation by her husband. Right gaze preference. Unable to look to the left. Blinks to threat on the right but not on the left. Pupils were irregular but reactive. Fundi not visualized. Left lower facial weakness. Tongue midline. Purposeful antigravity movements on the right. Left hemiplegia but will withdraw the left lower extremity to pain but not the left upper extremity. Tone is reduced on the left compared to the right. Left plantar upgoing right downgoing. Gait not tested. ASSESSMENT/PLAN Ms. Merrisa Zavadil is a 74 y.o. female with history of atrial fibrillation, HTN, DM and stroke presenting with right gaze deviation, left-sided hemiplegia  and dense receptive and expressive aphasia. She received tPA 07/26/2020 at 1259. Taken to IR for R ICA T occlusion.  Stroke:   R MCA infarct w/ petechial hemorrhage s/p tPA and IR revascularization, embolic secondary to known AF source on Eliquis  Code Stroke CT head No acute abnormality. Small vessel disease. Atrophy. ASPECTS 10.     CTA head & neck R ICA occlusion w/ min MCA flow. Some narrowing B VA and BA.   CT perfusion R frontal operculum core 16 cc, penumbra 136 R MCA territory  Cerebral angio RT ICA T occlusion with x 1 pass solitaire ->TICI 2C revascularization of RT MCA and a TICI 3 revascularization of RT ACA .  Post IR CT focal contrast stain in the RT globus pallidus. No gross mass effect or shift.   Post IR CT R insular infarct. R caudate head and lentiform nucleus hyperdensity. No hemorrhage.   CT new R frontal lobe hyper and hypointensity (infarct, edema, petechial hemorrhage)  MRI  pending   2D Echo 07/03/2020 EF 65-70%. No source of embolus   LDL 90  HgbA1c 12.7  VTE prophylaxis - SCDs   Eliquis (apixaban) daily prior to admission, now on No antithrombotic. Hold antithrombotic given petechial hemorrhage noted on imaging  Therapy recommendations:  .sbp   Disposition:  pending   Acute Respiratory Failure  Secondary to stroke  Intubated for IR, left intubated post IR   No sedation   Hold extubation until MRI completed  CCM on board  Atrial  Fibrillation w/ RVR  Home anticoagulation:  Eliquis (apixaban) daily   on diltiazem 240, metoprolol 12.5 bid  at home  Now on cardizem gtt . No AC at this time given petechial hemorrhage   Hypotension  Home meds:  None, no hx HTN . BP goal per IR x 24h following IR procedure and tPA administration.  . Currently on Phenylephrine to meet goal 100-140 x 24h  . Long-term BP goal normotensive  Hyperlipidemia  Home meds:  crestor 20, resumed in hospital  LDL 90, goal < 70  Continue statin at  discharge  Diabetes type II Uncontrolled  Home meds:  lantus 20 daily, metformin 500 bid  HgbA1c 12.7, goal < 7.0  CBGs Recent Labs    07/26/20 2330 07/27/20 0327 07/27/20 0748  GLUCAP 242* 145* 107*      SSI  Dysphagia . Secondary to stroke . NPO . Speech on board   Other Stroke Risk Factors  Advanced age  Hx stroke/TIA  06/2018 left pontine infarct likely secondary to small vessel disease source. DAPT x 3 weeks.  Other Active Problems  Microcytic anemia 10.2   Leukocytosis 11.5  Hospital day # 1 Continue close neurological monitoring and strict blood pressure control. Continue Cardizem drip for A. fib rate control. Will hold off on anticoagulation given large size of the infarct and some petechial hemorrhagic transformation. Check MRI scan of the brain later today if cytotoxic edema is increasing may need to consider hypertonic saline. Extubate after MRI if meeting extubation criteria. Discussed with Dr. Lynnell Catalan critical care medicine. Discussed with patient's husband and son at the bedside using [place] language interpreter over the phone This patient is critically ill and at significant risk of neurological worsening, death and care requires constant monitoring of vital signs, hemodynamics,respiratory and cardiac monitoring, extensive review of multiple databases, frequent neurological assessment, discussion with family, other specialists and medical decision making of high complexity.I have made any additions or clarifications directly to the above note.This critical care time does not reflect procedure time, or teaching time or supervisory time of PA/NP/Med Resident etc but could involve care discussion time.  I spent 35 minutes of neurocritical care time  in the care of  this patient.  Delia Heady, MD  To contact Stroke Continuity provider, please refer to [email or web address]. After hours, contact General Neurology

## 2020-07-27 NOTE — Progress Notes (Signed)
Initial Nutrition Assessment  DOCUMENTATION CODES:   Not applicable  INTERVENTION:   Initiate tube feeding via OG tube: Osmolite 1.2 at 40 ml/h (960 ml per day) Prosource TF 45 ml TID  Provides 1272 kcal, 86 gm protein, 778 ml free water daily   NUTRITION DIAGNOSIS:   Inadequate oral intake related to inability to eat as evidenced by NPO status.  GOAL:   Patient will meet greater than or equal to 90% of their needs  MONITOR:   Vent status, TF tolerance  REASON FOR ASSESSMENT:   Consult, Ventilator Enteral/tube feeding initiation and management  ASSESSMENT:   Pt with PMH of HTN, afib, DM admitted with R MCA stroke s/p tPA and thrombectomy.   Pt discussed during ICU rounds and with RN.   Patient is currently intubated on ventilator support MV: 6.1 L/min Temp (24hrs), Avg:97.7 F (36.5 C), Min:96.3 F (35.7 C), Max:98.5 F (36.9 C)  Medications reviewed and include: colace, SSI Neo @ 14 mcg  Labs reviewed: A1C: 8.7 16 F OG tube: tip gastric    Diet Order:   Diet Order            Diet NPO time specified  Diet effective now                 EDUCATION NEEDS:   No education needs have been identified at this time  Skin:  Skin Assessment: Reviewed RN Assessment  Last BM:  unknown  Height:   Ht Readings from Last 1 Encounters:  07/26/20 5\' 2"  (1.575 m)    Weight:   Wt Readings from Last 1 Encounters:  07/26/20 60.7 kg    Ideal Body Weight:  50 kg  BMI:  Body mass index is 24.48 kg/m.  Estimated Nutritional Needs:   Kcal:  1200  Protein:  75-90 grams  Fluid:  >1.5 L/day  09/25/20., RD, LDN, CNSC See [facility] for contact information

## 2020-07-27 NOTE — Progress Notes (Signed)
RT NOTE: RT transported patient on ventilator from room 4N20 to CT and back to room 4N20 with no complications. Vitals are stable. RT will continue to monitor.

## 2020-07-27 NOTE — Progress Notes (Signed)
Patient transported from 4N20 to MRI and back with no complications.

## 2020-07-27 NOTE — Plan of Care (Signed)
  Problem: Coping: Goal: Will identify appropriate support needs Outcome: Progressing   

## 2020-07-27 NOTE — Progress Notes (Signed)
Referring Physician(s): Code Stroke  Supervising Physician: Luanne Bras  Patient Status:  [facility] - In-pt  Chief Complaint:  Code stroke  Brief History:  Ms.  Majer is a 74 year old woman with a history of hypertension, atrial fibrillation diabetes, HOCM (TTE 06/2020), admitted for code stroke after being found unresponsive.  Severe deficit with complete aphasia, right gaze deviation, and unilateral flaccid paralysis (left).  Per chart, she stopped taking her medications about 1 week ago including anticoagulation for her A. fib.    She was found to have embolic occlusion of right carotid terminus identified with large right MCA stroke.  She underwent a RT common carotid arteriogram . RT CFA approach. S/P revascularization of RT ICA T occlusion with x 1 pass with solitaireX  46m x 20 mm device and contact aspiration and prox flow arrest \achieving a TICI 2C revascularization of RT MCA and a TICI 3 revascularization of RT ACA . Post treatment 7F angioseal applied for RT groin hemostasis .   Post procedure CT  Shows focal contrast stain in the RT globus pallidus. No gross mass effect or shift.  Subjective:  Patient remains intubated. Husband at bedside.  Allergies: Metformin and related  Medications: Prior to Admission medications   Medication Sig Start Date End Date Taking? Authorizing Provider  acetaminophen (TYLENOL) 325 MG tablet Take 2-3 tablets (650-975 mg total) by mouth every 6 (six) hours as needed for mild pain. 07/03/20   SJill Alexanders PA-C  apixaban (ELIQUIS) 5 MG TABS tablet Take 1 tablet (5 mg total) by mouth 2 (two) times daily. 07/03/20   LOswald Hillock MD  blood glucose meter kit and supplies KIT Dispense based on patient and insurance preference. Use up to four times daily as directed. (FOR ICD-10--E11.9). 07/25/18   Love, PIvan Anchors PA-C  diltiazem (CARTIA XT) 240 MG 24 hr capsule Take 1 capsule (240 mg total) by mouth daily. 07/03/20 07/03/21  LOswald Hillock MD  insulin glargine (LANTUS) 100 UNIT/ML Solostar Pen Inject 20 Units into the skin daily. 07/03/20   LOswald Hillock MD  Insulin Pen Needle (PEN NEEDLES) 32G X 4 MM MISC Use as directed with insulin pen 07/03/20   LOswald Hillock MD  metFORMIN (GLUCOPHAGE) 500 MG tablet Take 1 tablet (500 mg total) by mouth 2 (two) times daily with a meal. 07/03/20 07/03/21  LOswald Hillock MD  metoprolol tartrate (LOPRESSOR) 25 MG tablet Take 0.5 tablets (12.5 mg total) by mouth 2 (two) times daily. 07/03/20   LOswald Hillock MD  oxyCODONE (OXY IR/ROXICODONE) 5 MG immediate release tablet Take 1 tablet (5 mg total) by mouth every 6 (six) hours as needed for moderate pain or severe pain (pain not releived by tylenol or ibuprofen). 07/03/20   SJill Alexanders PA-C  rosuvastatin (CRESTOR) 20 MG tablet TAKE 1 TABLET(20 MG) BY MOUTH DAILY Patient taking differently: Take 20 mg by mouth daily.  12/20/18   MFrann Rider NP  senna-docusate (SENOKOT-S) 8.6-50 MG tablet Take 2 tablets by mouth at bedtime. 07/25/18   Love, PIvan Anchors PA-C     Vital Signs: BP (!) 133/95    Pulse 95    Temp 98.5 F (36.9 C) (Axillary)    Resp 16    Ht _0  (1.575 m)    Wt 60.7 kg    SpO2 100%    BMI 24.48 kg/m   Physical Exam Vitals reviewed.  Constitutional:      Appearance: Normal appearance.  Cardiovascular:  Rate and Rhythm: Tachycardia present.     Comments: Heart rate goes up to 130-140's with stimulation Pulmonary:     Effort: Pulmonary effort is normal. No respiratory distress.  Abdominal:     Palpations: Abdomen is soft.  Neurological:     Mental Status: She is alert.     Comments: Opens eyes to voice Right gaze preference Right eye blink to threat, left eye no blink to threat Minute movement left lower extremity, left upper extremity remains flaccid. Groin access site ok, no hematoma, no pseudoaneurysm      Imaging: CT HEAD WO CONTRAST  Result Date: 07/26/2020 CLINICAL DATA:  Stroke, follow-up post  revascularization. EXAM: CT HEAD WITHOUT CONTRAST TECHNIQUE: Contiguous axial images were obtained from the base of the skull through the vertex without intravenous contrast. COMPARISON:  CT angiogram head/neck and CT perfusion 07/26/2020, noncontrast head CT 07/26/2020 FINDINGS: Brain: Redemonstrated mineralization within the bilateral basal ganglia. There is subtle loss of gray-white differentiation within the right insula consistent with acute ischemic infarction. Additionally, there is subtle asymmetric hyperdensity of the right caudate and lentiform nuclei which may reflect contrast staining/enhancement at site of acute infarction. No convincing evidence of acute intracranial hemorrhage. Stable background generalized parenchymal atrophy and chronic small vessel ischemic disease. No extra-axial fluid collection. No evidence of intracranial mass. No midline shift. Vascular: Residual circulating contrast material limits evaluation for hyperdense vessels Skull: Normal. Negative for fracture or focal lesion. Sinuses/Orbits: Visualized orbits show no acute finding. Mild ethmoid and maxillary sinus mucosal thickening at the imaged levels. No significant mastoid effusion IMPRESSION: Subtle changes of acute ischemic infarction within the right insula. Additionally, there is subtle asymmetric hyperdensity of the right caudate and lentiform nuclei which may reflect contrast staining or enhancement at site of acute infarction. No convincing evidence of acute intracranial hemorrhage. Stable background generalized parenchymal atrophy and chronic small vessel ischemic disease. Electronically Signed   By: Kellie Simmering DO   On: 07/26/2020 15:47   CT CEREBRAL PERFUSION W CONTRAST  Result Date: 07/26/2020 CLINICAL DATA:  Acute presentation with left body weakness and speech disturbance. EXAM: CT ANGIOGRAPHY HEAD AND NECK CT PERFUSION BRAIN TECHNIQUE: Multidetector CT imaging of the head and neck was performed using the  standard protocol during bolus administration of intravenous contrast. Multiplanar CT image reconstructions and MIPs were obtained to evaluate the vascular anatomy. Carotid stenosis measurements (when applicable) are obtained utilizing NASCET criteria, using the distal internal carotid diameter as the denominator. Multiphase CT imaging of the brain was performed following IV bolus contrast injection. Subsequent parametric perfusion maps were calculated using RAPID software. CONTRAST:  Not annotated COMPARISON:  Head CT earlier same day FINDINGS: CTA NECK FINDINGS Aortic arch: No aortic atherosclerotic calcification seen. No aneurysm or dissection. Branching pattern is normal with wide patency of the origins. Right carotid system: Common carotid artery widely patent to the bifurcation. No atherosclerotic disease at the carotid bifurcation. ICA widely patent. Left carotid system: Common carotid artery widely patent to the bifurcation. Carotid bifurcation is normal without soft or calcified plaque. ICA widely patent. Vertebral arteries: Both vertebral artery origins are widely patent. Both vertebral arteries are normal through the cervical region to the foramen magnum. Skeleton: Ordinary cervical spondylosis. Other neck: No mass or lymphadenopathy. Upper chest: No active chest disease. 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 Review of the MIP images confirms the above findings CTA HEAD FINDINGS Anterior circulation: Complete occlusion  of the right carotid terminus. Minimal flow visible in the right MCA territory. Right ACA receives it supply through the anterior communicating route. No left stenosis or occlusion. Right PCA arises from the carotid artery immediately proximal to the occlusion. Posterior circulation: Both vertebral arteries patent to the basilar. Atherosclerotic narrowing the distal vertebral arteries and of the basilar. Posterior  circulation branch vessels are patent. Right PCA receives most of it supply from the anterior circulation. Venous sinuses: Patent and normal. Anatomic variants: None significant. Review of the MIP images confirms the above findings CT Brain Perfusion Findings: ASPECTS: 10 CBF (<30%) Volume: 67m Perfusion (Tmax>6.0s) volume: 157mMismatch Volume: 13618mnfarction Location:Right frontal operculum IMPRESSION: Embolic occlusion at the right carotid terminus. Minimal flow seen in the right MCA territory. Right ACA territory appears normally perfused, probably due to a patent anterior communicating artery. 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 Large penumbra, 136 cc in the remainder of the right MCA territory. No aortic atherosclerotic disease seen. No atherosclerotic disease at either carotid bifurcation. Some atherosclerotic narrowing of the distal vertebral arteries and basilar artery. Preliminary report sent by text at 1238 hours. Discussed with stroke PA 1242 hours. Electronically Signed   By: MarNelson ChimesD.   On: 07/26/2020 12:48   DG Chest Port 1 View  Result Date: 07/27/2020 CLINICAL DATA:  History of stroke and hypertension EXAM: PORTABLE CHEST 1 VIEW COMPARISON:  07/26/2020 FINDINGS: Endotracheal and enteric tubes are unchanged in position. Shallow inspiration. Cardiac enlargement. No vascular congestion or edema. Lungs are clear with improvement since prior study. No effusions. Surgical clips in the right upper quadrant. IMPRESSION: Cardiac enlargement. No evidence of active pulmonary disease. Improvement since prior study. Electronically Signed   By: WilLucienne CapersD.   On: 07/27/2020 06:35   DG CHEST PORT 1 VIEW  Result Date: 07/26/2020 CLINICAL DATA:  Inhibition EXAM: PORTABLE CHEST 1 VIEW COMPARISON:  Chest x-ray dated 06/29/2020. FINDINGS: Endotracheal tube appears well positioned with tip approximately 3 cm above the carina. Enteric tube passes below the diaphragm. Cardiomegaly.  Central pulmonary vascular congestion. No pleural effusion or pneumothorax is seen. IMPRESSION: 1. Endotracheal tube appears well positioned with tip approximately 3 cm above the carina. 2. Cardiomegaly with central pulmonary vascular congestion indicating CHF/volume overload. Electronically Signed   By: StaFranki CabotD.   On: 07/26/2020 16:58   DG Abd Portable 1V  Result Date: 07/26/2020 CLINICAL DATA:  OG tube placement EXAM: PORTABLE ABDOMEN - 1 VIEW COMPARISON:  None. FINDINGS: Enteric tube appears well positioned in the stomach. Visualized bowel gas pattern is nonobstructive. Cholecystectomy clips in the RIGHT upper quadrant. IMPRESSION: Enteric tube appears well positioned in the stomach. Electronically Signed   By: StaFranki CabotD.   On: 07/26/2020 16:57   CT HEAD CODE STROKE WO CONTRAST  Result Date: 07/26/2020 CLINICAL DATA:  Code stroke. Left-sided weakness. Right gaze disturbance. EXAM: CT HEAD WITHOUT CONTRAST TECHNIQUE: Contiguous axial images were obtained from the base of the skull through the vertex without intravenous contrast. COMPARISON:  07/09/2018 FINDINGS: Brain: Age related volume loss. Chronic small-vessel ischemic changes of the brainstem, cerebellum and cerebral hemispheres. Physiologic basal ganglia calcification. No visible acute infarction, mass lesion, hemorrhage, hydrocephalus or extra-axial collection. Vascular: There is atherosclerotic calcification of the major vessels at the base of the brain. Skull: Negative Sinuses/Orbits: Clear/normal Other: None ASPECTS ([place]roke Program Early CT Score) - Ganglionic level infarction (caudate, lentiform nuclei, internal capsule, insula, M1-M3 cortex): 7 - Supraganglionic infarction (M4-M6 cortex): 3  Total score (0-10 with 10 being normal): 10 IMPRESSION: 1. No acute CT finding. Chronic small-vessel ischemic changes throughout the brain. Physiologic calcification of basal ganglia. 2. ASPECTS is 10 3. Discussed with Dr. Cheral Marker  during interpretation at 12 17 hours. Electronically Signed   By: Nelson Chimes M.D.   On: 07/26/2020 12:22   CT ANGIO HEAD CODE STROKE  Result Date: 07/26/2020 CLINICAL DATA:  Acute presentation with left body weakness and speech disturbance. EXAM: CT ANGIOGRAPHY HEAD AND NECK CT PERFUSION BRAIN TECHNIQUE: Multidetector CT imaging of the head and neck was performed using the standard protocol during bolus administration of intravenous contrast. Multiplanar CT image reconstructions and MIPs were obtained to evaluate the vascular anatomy. Carotid stenosis measurements (when applicable) are obtained utilizing NASCET criteria, using the distal internal carotid diameter as the denominator. Multiphase CT imaging of the brain was performed following IV bolus contrast injection. Subsequent parametric perfusion maps were calculated using RAPID software. CONTRAST:  Not annotated COMPARISON:  Head CT earlier same day FINDINGS: CTA NECK FINDINGS Aortic arch: No aortic atherosclerotic calcification seen. No aneurysm or dissection. Branching pattern is normal with wide patency of the origins. Right carotid system: Common carotid artery widely patent to the bifurcation. No atherosclerotic disease at the carotid bifurcation. ICA widely patent. Left carotid system: Common carotid artery widely patent to the bifurcation. Carotid bifurcation is normal without soft or calcified plaque. ICA widely patent. Vertebral arteries: Both vertebral artery origins are widely patent. Both vertebral arteries are normal through the cervical region to the foramen magnum. Skeleton: Ordinary cervical spondylosis. Other neck: No mass or lymphadenopathy. Upper chest: No active chest disease. 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 Review of the MIP images confirms the above findings CTA HEAD FINDINGS Anterior circulation: Complete occlusion of the right carotid terminus. Minimal  flow visible in the right MCA territory. Right ACA receives it supply through the anterior communicating route. No left stenosis or occlusion. Right PCA arises from the carotid artery immediately proximal to the occlusion. Posterior circulation: Both vertebral arteries patent to the basilar. Atherosclerotic narrowing the distal vertebral arteries and of the basilar. Posterior circulation branch vessels are patent. Right PCA receives most of it supply from the anterior circulation. Venous sinuses: Patent and normal. Anatomic variants: None significant. Review of the MIP images confirms the above findings CT Brain Perfusion Findings: ASPECTS: 10 CBF (<30%) Volume: 13m Perfusion (Tmax>6.0s) volume: 1566mMismatch Volume: 13672mnfarction Location:Right frontal operculum IMPRESSION: Embolic occlusion at the right carotid terminus. Minimal flow seen in the right MCA territory. Right ACA territory appears normally perfused, probably due to a patent anterior communicating artery. 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 Large penumbra, 136 cc in the remainder of the right MCA territory. No aortic atherosclerotic disease seen. No atherosclerotic disease at either carotid bifurcation. Some atherosclerotic narrowing of the distal vertebral arteries and basilar artery. Preliminary report sent by text at 1238 hours. Discussed with stroke PA 1242 hours. Electronically Signed   By: MarNelson ChimesD.   On: 07/26/2020 12:48   CT ANGIO NECK CODE STROKE  Result Date: 07/26/2020 CLINICAL DATA:  Acute presentation with left body weakness and speech disturbance. EXAM: CT ANGIOGRAPHY HEAD AND NECK CT PERFUSION BRAIN TECHNIQUE: Multidetector CT imaging of the head and neck was performed using the standard protocol during bolus administration of intravenous contrast. Multiplanar CT image reconstructions and MIPs were obtained to evaluate the vascular anatomy.  Carotid stenosis measurements (when applicable) are obtained utilizing  NASCET criteria, using the distal internal carotid diameter as the denominator. Multiphase CT imaging of the brain was performed following IV bolus contrast injection. Subsequent parametric perfusion maps were calculated using RAPID software. CONTRAST:  Not annotated COMPARISON:  Head CT earlier same day FINDINGS: CTA NECK FINDINGS Aortic arch: No aortic atherosclerotic calcification seen. No aneurysm or dissection. Branching pattern is normal with wide patency of the origins. Right carotid system: Common carotid artery widely patent to the bifurcation. No atherosclerotic disease at the carotid bifurcation. ICA widely patent. Left carotid system: Common carotid artery widely patent to the bifurcation. Carotid bifurcation is normal without soft or calcified plaque. ICA widely patent. Vertebral arteries: Both vertebral artery origins are widely patent. Both vertebral arteries are normal through the cervical region to the foramen magnum. Skeleton: Ordinary cervical spondylosis. Other neck: No mass or lymphadenopathy. Upper chest: No active chest disease. 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 Review of the MIP images confirms the above findings CTA HEAD FINDINGS Anterior circulation: Complete occlusion of the right carotid terminus. Minimal flow visible in the right MCA territory. Right ACA receives it supply through the anterior communicating route. No left stenosis or occlusion. Right PCA arises from the carotid artery immediately proximal to the occlusion. Posterior circulation: Both vertebral arteries patent to the basilar. Atherosclerotic narrowing the distal vertebral arteries and of the basilar. Posterior circulation branch vessels are patent. Right PCA receives most of it supply from the anterior circulation. Venous sinuses: Patent and normal. Anatomic variants: None significant. Review of the MIP images confirms the above findings CT  Brain Perfusion Findings: ASPECTS: 10 CBF (<30%) Volume: 6m Perfusion (Tmax>6.0s) volume: 1534mMismatch Volume: 13679mnfarction Location:Right frontal operculum IMPRESSION: Embolic occlusion at the right carotid terminus. Minimal flow seen in the right MCA territory. Right ACA territory appears normally perfused, probably due to a patent anterior communicating artery. 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 Large penumbra, 136 cc in the remainder of the right MCA territory. No aortic atherosclerotic disease seen. No atherosclerotic disease at either carotid bifurcation. Some atherosclerotic narrowing of the distal vertebral arteries and basilar artery. Preliminary report sent by text at 1238 hours. Discussed with stroke PA 1242 hours. Electronically Signed   By: MarNelson ChimesD.   On: 07/26/2020 12:48    Labs:  CBC: Recent Labs    07/02/20 0950 07/02/20 0950 07/03/20 0344 07/26/20 1211 07/26/20 1215 07/27/20 0637  WBC 15.4*  --  13.3* 6.5  --  11.5*  HGB 10.8*   < > 10.1* 10.7* 12.9 10.2*  HCT 34.9*   < > 33.4* 37.7 38.0 35.9*  PLT 242  --  313 185  --  235   < > = values in this interval not displayed.    COAGS: Recent Labs    06/29/20 0318 07/26/20 1211  INR 1.4* 1.1  APTT 35 24    BMP: Recent Labs    07/02/20 1825 07/02/20 1825 07/03/20 0344 07/26/20 1211 07/26/20 1215 07/27/20 0637  NA 134*   < > 134* 137 141 138  K 4.0   < > 3.6 4.6 4.5 4.6  CL 108   < > 108 106 106 108  CO2 17*  --  18* 19*  --  22  GLUCOSE 306*   < > 335* 203* 198* 110*  BUN 22   < > 15 22  25* 19  CALCIUM 7.7*  --  7.7* 9.2  --  8.6*  CREATININE 1.10*   < > 0.98 1.17* 1.10* 0.94  GFRNONAA 50*  --  58* 47*  --  >60  GFRAA 58*  --  >60 54*  --  >60   < > = values in this interval not displayed.    LIVER FUNCTION TESTS: Recent Labs    07/01/20 0556 07/02/20 1825 07/03/20 0344 07/26/20 1211  BILITOT 0.5 0.2* 0.6 1.4*  AST 43* _0 ALT _1 ALKPHOS 112 109 93 90   PROT 5.3* 5.2* 5.0* 6.8  ALBUMIN 1.9* 1.8* 1.8* 3.7    Assessment and Plan:  Code stroke  S/P RT common carotid arteriogram . RT CFA approach. S/P revascularization of RT ICA T occlusion with x 1 pass with solitaireX  44m x 20 mm device and contact aspiration and prox flow arrest \achieving a TICI 2C revascularization of RT MCA and a TICI 3 revascularization of RT ACA by Dr. DEstanislado Pandyyesterday.  Post procedure CT  Shows focal contrast stain in the RT globus pallidus. No gross mass effect or shift.  Care per neurology. IR will continue to follow for now.  Electronically Signed: WMurrell Redden PA-C 07/27/2020, 10:16 AM    I spent a total of 15 Minutes at the the patient's bedside AND on the patient's [facility] floor or unit, greater than 50% of which was counseling/coordinating care for f/u cerebral intervention/stroke.

## 2020-07-27 NOTE — Progress Notes (Signed)
Dr. Pearlean Brownie adjusted SBP goal to 120-160.

## 2020-07-28 ENCOUNTER — Other Ambulatory Visit: Payer: Self-pay

## 2020-07-28 DIAGNOSIS — I6601 Occlusion and stenosis of right middle cerebral artery: Secondary | ICD-10-CM | POA: Diagnosis not present

## 2020-07-28 LAB — GLUCOSE, CAPILLARY
Glucose-Capillary: 113 mg/dL — ABNORMAL HIGH (ref 70–99)
Glucose-Capillary: 113 mg/dL — ABNORMAL HIGH (ref 70–99)
Glucose-Capillary: 168 mg/dL — ABNORMAL HIGH (ref 70–99)
Glucose-Capillary: 168 mg/dL — ABNORMAL HIGH (ref 70–99)
Glucose-Capillary: 174 mg/dL — ABNORMAL HIGH (ref 70–99)
Glucose-Capillary: 180 mg/dL — ABNORMAL HIGH (ref 70–99)
Glucose-Capillary: 182 mg/dL — ABNORMAL HIGH (ref 70–99)
Glucose-Capillary: 182 mg/dL — ABNORMAL HIGH (ref 70–99)
Glucose-Capillary: 188 mg/dL — ABNORMAL HIGH (ref 70–99)
Glucose-Capillary: 240 mg/dL — ABNORMAL HIGH (ref 70–99)

## 2020-07-28 LAB — BASIC METABOLIC PANEL
Anion gap: 12 (ref 5–15)
BUN: 17 mg/dL (ref 8–23)
CO2: 19 mmol/L — ABNORMAL LOW (ref 22–32)
Calcium: 8.2 mg/dL — ABNORMAL LOW (ref 8.9–10.3)
Chloride: 105 mmol/L (ref 98–111)
Creatinine, Ser: 0.95 mg/dL (ref 0.44–1.00)
GFR calc Af Amer: >60 mL/min (ref >60)
GFR calc non Af Amer: 60 mL/min — ABNORMAL LOW (ref >60)
Glucose, Bld: 180 mg/dL — ABNORMAL HIGH (ref 70–99)
Potassium: 4.1 mmol/L (ref 3.5–5.1)
Sodium: 136 mmol/L (ref 135–145)

## 2020-07-28 LAB — CBC
HCT: 34.8 % — ABNORMAL LOW (ref 36.0–46.0)
Hemoglobin: 10.2 g/dL — ABNORMAL LOW (ref 12.0–15.0)
MCH: 21.7 pg — ABNORMAL LOW (ref 26.0–34.0)
MCHC: 29.3 g/dL — ABNORMAL LOW (ref 30.0–36.0)
MCV: 74.2 fL — ABNORMAL LOW (ref 80.0–100.0)
Platelets: 209 10*3/uL (ref 150–400)
RBC: 4.69 MIL/uL (ref 3.87–5.11)
RDW: 15.8 % — ABNORMAL HIGH (ref 11.5–15.5)
WBC: 13.3 10*3/uL — ABNORMAL HIGH (ref 4.0–10.5)
nRBC: 0 % (ref 0.0–0.2)

## 2020-07-28 LAB — MAGNESIUM
Magnesium: 2.5 mg/dL — ABNORMAL HIGH (ref 1.7–2.4)
Magnesium: 2.1 mg/dL (ref 1.7–2.4)

## 2020-07-28 LAB — PHOSPHORUS
Phosphorus: 3.9 mg/dL (ref 2.5–4.6)
Phosphorus: 3.4 mg/dL (ref 2.5–4.6)

## 2020-07-28 MED ORDER — DOCUSATE SODIUM 50 MG/5ML PO LIQD
100.0000 mg | Freq: Two times a day (BID) | ORAL | Status: DC
  Administered 2020-07-28 – 2020-07-31 (×5): 100 mg
  Filled 2020-07-28 (×5): qty 10

## 2020-07-28 MED ORDER — MAGNESIUM SULFATE 2 GM/50ML IV SOLN
2.0000 g | Freq: Once | INTRAVENOUS | Status: AC
  Administered 2020-07-28: 2 g via INTRAVENOUS
  Filled 2020-07-28: qty 50

## 2020-07-28 MED ORDER — DILTIAZEM 12 MG/ML ORAL SUSPENSION
60.0000 mg | Freq: Four times a day (QID) | ORAL | Status: DC
  Administered 2020-07-28 – 2020-08-02 (×20): 60 mg
  Filled 2020-07-28 (×21): qty 6

## 2020-07-28 MED ORDER — METOPROLOL TARTRATE 50 MG PO TABS
50.0000 mg | ORAL_TABLET | Freq: Two times a day (BID) | ORAL | Status: DC
  Administered 2020-07-28 – 2020-07-30 (×5): 50 mg
  Filled 2020-07-28 (×5): qty 1

## 2020-07-28 NOTE — Progress Notes (Signed)
Wasted 225 mL of fentanyl in sink with Anastasia Fiedler. Jaclyn Shaggy RN

## 2020-07-28 NOTE — Progress Notes (Signed)
eLink Physician-Brief Progress Note Patient Name: Samantha Paul DOB: August 14, 1946 MRN: [id number]   Date of Service  07/28/2020  HPI/Events of Note  Request for Mg repletion.  eICU Interventions  2g MgSO4 ordered.     Intervention Category Minor Interventions: Electrolytes abnormality - evaluation and management  Marveen Reeks Johanny Segers 07/28/2020, 12:02 AM

## 2020-07-28 NOTE — Progress Notes (Signed)
Referring Physician(s): Code stroke- Kerney Elbe  Supervising Physician: Luanne Bras  Patient Status:  [facility] - In-pt  Chief Complaint: None- intubated with sedation  Subjective:  Acute CVA s/p cerebral arteriogram with emergent mechanical thrombectomy of right ICA terminus occlusion achieving a TICI 2c/3 revascularization via right femoral approach 07/26/2020 by Dr. Estanislado Pandy. Patient laying in bed intubated with sedation. She opens eyes to voice/gentle touch and follows simple commands. Family at bedside. Can spontaneously move right side, left side withdraws from pain. Right groin puncture site c/d/i.    Allergies: Metformin and related  Medications: Prior to Admission medications   Medication Sig Start Date End Date Taking? Authorizing Provider  acetaminophen (TYLENOL) 325 MG tablet Take 2-3 tablets (650-975 mg total) by mouth every 6 (six) hours as needed for mild pain. 07/03/20   Jill Alexanders, PA-C  apixaban (ELIQUIS) 5 MG TABS tablet Take 1 tablet (5 mg total) by mouth 2 (two) times daily. 07/03/20   Oswald Hillock, MD  blood glucose meter kit and supplies KIT Dispense based on patient and insurance preference. Use up to four times daily as directed. (FOR ICD-10--E11.9). 07/25/18   Love, Ivan Anchors, PA-C  diltiazem (CARTIA XT) 240 MG 24 hr capsule Take 1 capsule (240 mg total) by mouth daily. 07/03/20 07/03/21  Oswald Hillock, MD  insulin glargine (LANTUS) 100 UNIT/ML Solostar Pen Inject 20 Units into the skin daily. 07/03/20   Oswald Hillock, MD  Insulin Pen Needle (PEN NEEDLES) 32G X 4 MM MISC Use as directed with insulin pen 07/03/20   Oswald Hillock, MD  metFORMIN (GLUCOPHAGE) 500 MG tablet Take 1 tablet (500 mg total) by mouth 2 (two) times daily with a meal. 07/03/20 07/03/21  Oswald Hillock, MD  metoprolol tartrate (LOPRESSOR) 25 MG tablet Take 0.5 tablets (12.5 mg total) by mouth 2 (two) times daily. 07/03/20   Oswald Hillock, MD  oxyCODONE (OXY IR/ROXICODONE) 5 MG immediate  release tablet Take 1 tablet (5 mg total) by mouth every 6 (six) hours as needed for moderate pain or severe pain (pain not releived by tylenol or ibuprofen). 07/03/20   Jill Alexanders, PA-C  rosuvastatin (CRESTOR) 20 MG tablet TAKE 1 TABLET(20 MG) BY MOUTH DAILY Patient taking differently: Take 20 mg by mouth daily.  12/20/18   Frann Rider, NP  senna-docusate (SENOKOT-S) 8.6-50 MG tablet Take 2 tablets by mouth at bedtime. 07/25/18   Love, Ivan Anchors, PA-C     Vital Signs: BP 138/70   Pulse (!) 106   Temp 98.7 F (37.1 C) (Axillary)   Resp 15   Ht 5' 2"  (1.575 m)   Wt 133 lb 13.1 oz (60.7 kg)   SpO2 100%   BMI 24.48 kg/m   Physical Exam Vitals and nursing note reviewed.  Constitutional:      General: She is not in acute distress.    Comments: Intubated with sedation.  Pulmonary:     Effort: Pulmonary effort is normal. No respiratory distress.     Comments: Intubated with sedation. Skin:    General: Skin is warm and dry.     Comments: Right groin puncture site soft without active bleeding or hematoma.  Neurological:     Comments: Intubated with sedation. She opens eyes to voice/gentle touch and follows simple commands. PERRL bilaterally. Right gaze preference. Can spontaneously move right side, left side withdraws from pain. Distal pulses (DPs) 1+ bilaterally.     Imaging: CT HEAD WO CONTRAST  Addendum  Date: 07/27/2020   ADDENDUM REPORT: 07/27/2020 13:27 ADDENDUM: Study discussed by telephone with Dr. Leonie Man on 07/27/2020 at 1313 hours. He advised that follow-up MRI is pending. Electronically Signed   By: Genevie Ann M.D.   On: 07/27/2020 13:27   Result Date: 07/27/2020 CLINICAL DATA:  74 year old female status post code stroke presentation with right ICA terminus occlusion. Status post IV tPA, endovascular revascularization. EXAM: CT HEAD WITHOUT CONTRAST TECHNIQUE: Contiguous axial images were obtained from the base of the skull through the vertex without intravenous  contrast. COMPARISON:  Post treatment plain head CT, CTA head, CT Perfusion, presentation head CT yesterday. FINDINGS: Brain: Mass effect now on the right lateral ventricle, especially the right frontal horn (series 6, image 13) with mixed hypo- and hyper density in the anterior right frontal lobe, right inferior frontal gyrus and right basal ganglia (series 6, image 13). No significant midline shift. No extra-axial hemorrhage or ventriculomegaly. Superimposed dystrophic basal ganglia calcifications are stable. Posterior to the anterior genu gray-white matter differentiation appears largely stable. There is possible early cytotoxic edema at the posterior operculum. Stable left hemisphere and posterior fossa gray-white matter differentiation. No other intracranial blood products. Vascular: Calcified atherosclerosis at the skull base. No suspicious intracranial vascular hyperdensity. Skull: No acute osseous abnormality identified. Sinuses/Orbits: Intubated, small volume of fluid layering in the pharynx. Mild paranasal sinus fluid or mucosal thickening. Tympanic cavities and mastoids remain clear. Other: Disconjugate gaze now.  Scalp soft tissues remain negative. IMPRESSION: 1. New mass effect in the anterior right frontal lobe with regional increasing mixed hypo- and hyperdensity. Favor a combination of anterior right MCA territory cytotoxic edema and petechial hemorrhage. 2. No malignant hemorrhagic transformation at this time. No midline shift or loss of basilar cisterns. 3. Serial noncontrast Head CT for surveillance of mass effect recommended. Electronically Signed: By: Genevie Ann M.D. On: 07/27/2020 12:34   CT HEAD WO CONTRAST  Result Date: 07/26/2020 CLINICAL DATA:  Stroke, follow-up post revascularization. EXAM: CT HEAD WITHOUT CONTRAST TECHNIQUE: Contiguous axial images were obtained from the base of the skull through the vertex without intravenous contrast. COMPARISON:  CT angiogram head/neck and CT perfusion  07/26/2020, noncontrast head CT 07/26/2020 FINDINGS: Brain: Redemonstrated mineralization within the bilateral basal ganglia. There is subtle loss of gray-white differentiation within the right insula consistent with acute ischemic infarction. Additionally, there is subtle asymmetric hyperdensity of the right caudate and lentiform nuclei which may reflect contrast staining/enhancement at site of acute infarction. No convincing evidence of acute intracranial hemorrhage. Stable background generalized parenchymal atrophy and chronic small vessel ischemic disease. No extra-axial fluid collection. No evidence of intracranial mass. No midline shift. Vascular: Residual circulating contrast material limits evaluation for hyperdense vessels Skull: Normal. Negative for fracture or focal lesion. Sinuses/Orbits: Visualized orbits show no acute finding. Mild ethmoid and maxillary sinus mucosal thickening at the imaged levels. No significant mastoid effusion IMPRESSION: Subtle changes of acute ischemic infarction within the right insula. Additionally, there is subtle asymmetric hyperdensity of the right caudate and lentiform nuclei which may reflect contrast staining or enhancement at site of acute infarction. No convincing evidence of acute intracranial hemorrhage. Stable background generalized parenchymal atrophy and chronic small vessel ischemic disease. Electronically Signed   By: Kellie Simmering DO   On: 07/26/2020 15:47   MR BRAIN WO CONTRAST  Result Date: 07/27/2020 CLINICAL DATA:  Stroke follow-up EXAM: MRI HEAD WITHOUT CONTRAST TECHNIQUE: Multiplanar, multiecho pulse sequences of the brain and surrounding structures were obtained without intravenous contrast. COMPARISON:  Brain MRI  07/10/2018 FINDINGS: Brain: Multifocal ischemic infarction within the right MCA territory, greatest at the frontal operculum and insula. There is also cortical ischemia within the right ACA territory and right PCA territory. There is a  punctate focus of acute ischemia in the left occipital lobe. Large area of infarction in the right basal ganglia. There is petechial hemorrhage versus is contrast staining in the right basal ganglia as well. Moderate edema associated with the ischemic areas. There is mass effect on the frontal horn of the right lateral ventricle. No herniation. There is an old left pontine infarct. No chronic microhemorrhage. Normal midline structures. Vascular: Normal flow voids. Skull and upper cervical spine: Normal marrow signal. Sinuses/Orbits: Right maxillary sinus mucosal thickening. Normal orbits. Other: None. IMPRESSION: 1. Multifocal ischemic infarction within the right MCA territory, greatest at the frontal operculum and insula. 2. Additional cortical ischemia within the right ACA territory and right PCA territory. 3. Large area of infarction in the right basal ganglia with petechial hemorrhage versus contrast staining. 4. Punctate focus of acute ischemia in the left occipital lobe. Electronically Signed   By: Ulyses Jarred M.D.   On: 07/27/2020 23:50   CT CEREBRAL PERFUSION W CONTRAST  Result Date: 07/26/2020 CLINICAL DATA:  Acute presentation with left body weakness and speech disturbance. EXAM: CT ANGIOGRAPHY HEAD AND NECK CT PERFUSION BRAIN TECHNIQUE: Multidetector CT imaging of the head and neck was performed using the standard protocol during bolus administration of intravenous contrast. Multiplanar CT image reconstructions and MIPs were obtained to evaluate the vascular anatomy. Carotid stenosis measurements (when applicable) are obtained utilizing NASCET criteria, using the distal internal carotid diameter as the denominator. Multiphase CT imaging of the brain was performed following IV bolus contrast injection. Subsequent parametric perfusion maps were calculated using RAPID software. CONTRAST:  Not annotated COMPARISON:  Head CT earlier same day FINDINGS: CTA NECK FINDINGS Aortic arch: No aortic  atherosclerotic calcification seen. No aneurysm or dissection. Branching pattern is normal with wide patency of the origins. Right carotid system: Common carotid artery widely patent to the bifurcation. No atherosclerotic disease at the carotid bifurcation. ICA widely patent. Left carotid system: Common carotid artery widely patent to the bifurcation. Carotid bifurcation is normal without soft or calcified plaque. ICA widely patent. Vertebral arteries: Both vertebral artery origins are widely patent. Both vertebral arteries are normal through the cervical region to the foramen magnum. Skeleton: Ordinary cervical spondylosis. Other neck: No mass or lymphadenopathy. Upper chest: No active chest disease. 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 Review of the MIP images confirms the above findings CTA HEAD FINDINGS Anterior circulation: Complete occlusion of the right carotid terminus. Minimal flow visible in the right MCA territory. Right ACA receives it supply through the anterior communicating route. No left stenosis or occlusion. Right PCA arises from the carotid artery immediately proximal to the occlusion. Posterior circulation: Both vertebral arteries patent to the basilar. Atherosclerotic narrowing the distal vertebral arteries and of the basilar. Posterior circulation branch vessels are patent. Right PCA receives most of it supply from the anterior circulation. Venous sinuses: Patent and normal. Anatomic variants: None significant. Review of the MIP images confirms the above findings CT Brain Perfusion Findings: ASPECTS: 10 CBF (<30%) Volume: 20m Perfusion (Tmax>6.0s) volume: 1563mMismatch Volume: 13643mnfarction Location:Right frontal operculum IMPRESSION: Embolic occlusion at the right carotid terminus. Minimal flow seen in the right MCA territory. Right ACA territory appears normally perfused, probably due  to a patent anterior  communicating artery. 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 Large penumbra, 136 cc in the remainder of the right MCA territory. No aortic atherosclerotic disease seen. No atherosclerotic disease at either carotid bifurcation. Some atherosclerotic narrowing of the distal vertebral arteries and basilar artery. Preliminary report sent by text at 1238 hours. Discussed with stroke PA 1242 hours. Electronically Signed   By: Nelson Chimes M.D.   On: 07/26/2020 12:48   DG Chest Port 1 View  Result Date: 07/27/2020 CLINICAL DATA:  History of stroke and hypertension EXAM: PORTABLE CHEST 1 VIEW COMPARISON:  07/26/2020 FINDINGS: Endotracheal and enteric tubes are unchanged in position. Shallow inspiration. Cardiac enlargement. No vascular congestion or edema. Lungs are clear with improvement since prior study. No effusions. Surgical clips in the right upper quadrant. IMPRESSION: Cardiac enlargement. No evidence of active pulmonary disease. Improvement since prior study. Electronically Signed   By: Lucienne Capers M.D.   On: 07/27/2020 06:35   DG CHEST PORT 1 VIEW  Result Date: 07/26/2020 CLINICAL DATA:  Inhibition EXAM: PORTABLE CHEST 1 VIEW COMPARISON:  Chest x-ray dated 06/29/2020. FINDINGS: Endotracheal tube appears well positioned with tip approximately 3 cm above the carina. Enteric tube passes below the diaphragm. Cardiomegaly. Central pulmonary vascular congestion. No pleural effusion or pneumothorax is seen. IMPRESSION: 1. Endotracheal tube appears well positioned with tip approximately 3 cm above the carina. 2. Cardiomegaly with central pulmonary vascular congestion indicating CHF/volume overload. Electronically Signed   By: Franki Cabot M.D.   On: 07/26/2020 16:58   DG Abd Portable 1V  Result Date: 07/26/2020 CLINICAL DATA:  OG tube placement EXAM: PORTABLE ABDOMEN - 1 VIEW COMPARISON:  None. FINDINGS: Enteric tube appears well positioned in the stomach. Visualized bowel gas pattern is nonobstructive.  Cholecystectomy clips in the RIGHT upper quadrant. IMPRESSION: Enteric tube appears well positioned in the stomach. Electronically Signed   By: Franki Cabot M.D.   On: 07/26/2020 16:57   IR PERCUTANEOUS ART THROMBECTOMY/INFUSION INTRACRANIAL INC DIAG ANGIO  Result Date: 07/28/2020 INDICATION: Acute onset of left-sided hemiplegia, right gaze deviation and slurred speech. Occluded right internal carotid artery supraclinoid segment, right middle cerebral artery and the right anterior cerebral artery. EXAM: 1. EMERGENT LARGE VESSEL OCCLUSION THROMBOLYSIS (anterior CIRCULATION) COMPARISON:  CT angiogram of the head and neck of July 26, 2020. MEDICATIONS: Ancef 2 g IV antibiotic was administered within 1 hour of the procedure. ANESTHESIA/SEDATION: General anesthesia CONTRAST:  Isovue 300 approximately 100 mL FLUOROSCOPY TIME:  Fluoroscopy Time: 38 minutes 36 seconds (1410 mGy). COMPLICATIONS: None immediate. TECHNIQUE: Following a full explanation of the procedure along with the potential associated complications, an informed witnessed consent was obtained the patient's spouse via an interpreter. The risks of intracranial hemorrhage of 10%, worsening neurological deficit, ventilator dependency, death and inability to revascularize were all reviewed in detail with the patient's spouse. The patient was then put under general anesthesia by the Department of Anesthesiology at [facility]. The right groin was prepped and draped in the usual sterile fashion. Thereafter using modified Seldinger technique, transfemoral access into the right common femoral artery was obtained without difficulty. Over a 0.035 inch guidewire a 5 French Pinnacle sheath was inserted. Through this, and also over a 0.035 inch guidewire a 8 French 25 cm catheter was advanced to the aortic arch region and selectively positioned in the innominate artery and right common carotid artery. FINDINGS: Innominate arteriogram demonstrates the origin  of the right subclavian artery and the right common carotid artery to  be widely patent. Moderate tortuosity is seen of the right common carotid artery proximally. The right vertebral artery origin is widely patent. The vessel is seen to opacify to the cranial skull base. The opacified portion of the basilar artery on the AP projection demonstrates patency with suggestion of a 50% stenosis of the right vertebrobasilar junction. The right common carotid arteriogram demonstrates the right external carotid artery and its major branches to be widely patent. The right internal carotid artery at the bulb to the cranial skull base is patent with moderate severe tortuosity involving the mid right M1 segment associated with a 50% stenosis. The petrous and proximal cavernous segments are widely patent. There is complete angiographic occlusion of the right internal carotid artery just distal to the origin of the right posterior communicating artery supplying the right posterior cerebral artery distribution. PROCEDURE: The diagnostic JB 1 catheter in the right common carotid artery was exchanged over a 0.035 inch 300 cm Rosen exchange guidewire for an 087 95 cm balloon guide catheter which had been prepped with 50% contrast and 50% heparinized saline infusion. Guidewire was removed. Good aspiration obtained from the hub of the balloon guide catheter. A gentle contrast injection demonstrated no evidence of vasospasm, or of intraluminal filling defects. Over a 0.014 inch standard Synchro micro guidewire, an 027 160 cm Marksman microcatheter inside of an 071 135 cm Zoom aspiration catheter was advanced without difficulty to the distal cavernous segment of the right internal carotid artery. The micro guidewire was then gently manipulated without difficulty and advanced into the inferior division of the right middle cerebral artery M2 M3 region followed by the microcatheter. The guidewire was removed. Mild resistance was again to  aspiration which cleared with more proximal position of the microcatheter. A gentle control arteriogram performed through the microcatheter demonstrated safe position of tip of the microcatheter, which was connected to continuous heparinized saline infusion. A 3 mm x 20 mm Solitaire X retrieval device was then advanced to the distal end of the microcatheter. The O ring on the delivery microcatheter was loosened. With slight forward gentle traction with the right hand on the delivery micro guidewire with the left hand the delivery microcatheter was retrieved unsheathing the retrieval device. The 071 aspiration catheter was engaged in the occluded right middle cerebral artery M1 segment. Continued aspiration was applied using an aspiration device with proximal flow arrest in the right internal carotid artery, and aspiration using a 60 mL syringe at the hub of the balloon guide catheter for approximately 2-1/2 minutes. Thereafter, the combination of the retrieval device, the microcatheter, aspiration catheter was retrieved and removed as aspiration was applied at the hub of the balloon guide catheter. Large clot and small pieces were seen from within the aspiration catheter. A control arteriogram performed through the balloon guide following reversal of flow arrest in the right internal carotid artery demonstrated wide patency of the superior and inferior divisions of the right middle cerebral artery. A control arteriogram performed following infusion of 25 mcg of nitroglycerin intra-arterially now demonstrated significantly improved caliber and flow through the superior and inferior divisions. There continued be slow flow involving the M3 M4 branches of the anterior of peri insular branches consistent with the site of the core seen on the CT perfusion studies. A TICI 2C revascularization of the right MCA distribution had been achieved. The right anterior cerebral artery was now also widely patent with a TICI 3  revascularization. Patency of the right posterior communicating artery and the right posterior  cerebral artery territory was intact. Control arteriogram performed through the balloon guide in the right common carotid artery demonstrated free flow in the right internal carotid artery extra cranially and intracranially. This was then removed. The 8 French Pinnacle sheath in the right groin was replaced with an 8 French Angio-Seal closure device for hemostasis. The distal pulses remained Dopplerable in both the DPs and posterior tibial arteries bilaterally unchanged. Post procedure CT scan of the brain demonstrated contrast blush in the right basal ganglia region. No gross mass effect or midline shift was noted. The patient was left intubated on account of the patient's neurologic condition prior to intubation. She was then transported to the neuro ICU for post thrombectomy care. IMPRESSION: Status post endovascular revascularization of occluded right internal carotid artery supraclinoid segment, the right middle cerebral and the right anterior cerebral artery with 1 pass with a 3 mm x 20 mm Solitaire X retrieval device, contact aspiration and proximal flow arrest achieving a TICI 2C revascularization. PLAN: Follow-up as per referring MD. Electronically Signed   By: Luanne Bras M.D.   On: 07/27/2020 14:02   CT HEAD CODE STROKE WO CONTRAST  Result Date: 07/26/2020 CLINICAL DATA:  Code stroke. Left-sided weakness. Right gaze disturbance. EXAM: CT HEAD WITHOUT CONTRAST TECHNIQUE: Contiguous axial images were obtained from the base of the skull through the vertex without intravenous contrast. COMPARISON:  07/09/2018 FINDINGS: Brain: Age related volume loss. Chronic small-vessel ischemic changes of the brainstem, cerebellum and cerebral hemispheres. Physiologic basal ganglia calcification. No visible acute infarction, mass lesion, hemorrhage, hydrocephalus or extra-axial collection. Vascular: There is  atherosclerotic calcification of the major vessels at the base of the brain. Skull: Negative Sinuses/Orbits: Clear/normal Other: None ASPECTS ([place] Stroke Program Early CT Score) - Ganglionic level infarction (caudate, lentiform nuclei, internal capsule, insula, M1-M3 cortex): 7 - Supraganglionic infarction (M4-M6 cortex): 3 Total score (0-10 with 10 being normal): 10 IMPRESSION: 1. No acute CT finding. Chronic small-vessel ischemic changes throughout the brain. Physiologic calcification of basal ganglia. 2. ASPECTS is 10 3. Discussed with Dr. Cheral Marker during interpretation at 12 17 hours. Electronically Signed   By: Nelson Chimes M.D.   On: 07/26/2020 12:22   CT ANGIO HEAD CODE STROKE  Result Date: 07/26/2020 CLINICAL DATA:  Acute presentation with left body weakness and speech disturbance. EXAM: CT ANGIOGRAPHY HEAD AND NECK CT PERFUSION BRAIN TECHNIQUE: Multidetector CT imaging of the head and neck was performed using the standard protocol during bolus administration of intravenous contrast. Multiplanar CT image reconstructions and MIPs were obtained to evaluate the vascular anatomy. Carotid stenosis measurements (when applicable) are obtained utilizing NASCET criteria, using the distal internal carotid diameter as the denominator. Multiphase CT imaging of the brain was performed following IV bolus contrast injection. Subsequent parametric perfusion maps were calculated using RAPID software. CONTRAST:  Not annotated COMPARISON:  Head CT earlier same day FINDINGS: CTA NECK FINDINGS Aortic arch: No aortic atherosclerotic calcification seen. No aneurysm or dissection. Branching pattern is normal with wide patency of the origins. Right carotid system: Common carotid artery widely patent to the bifurcation. No atherosclerotic disease at the carotid bifurcation. ICA widely patent. Left carotid system: Common carotid artery widely patent to the bifurcation. Carotid bifurcation is normal without soft or calcified  plaque. ICA widely patent. Vertebral arteries: Both vertebral artery origins are widely patent. Both vertebral arteries are normal through the cervical region to the foramen magnum. Skeleton: Ordinary cervical spondylosis. Other neck: No mass or lymphadenopathy. Upper chest: No active chest disease. 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 Review of the MIP images confirms the above findings CTA HEAD FINDINGS Anterior circulation: Complete occlusion of the right carotid terminus. Minimal flow visible in the right MCA territory. Right ACA receives it supply through the anterior communicating route. No left stenosis or occlusion. Right PCA arises from the carotid artery immediately proximal to the occlusion. Posterior circulation: Both vertebral arteries patent to the basilar. Atherosclerotic narrowing the distal vertebral arteries and of the basilar. Posterior circulation branch vessels are patent. Right PCA receives most of it supply from the anterior circulation. Venous sinuses: Patent and normal. Anatomic variants: None significant. Review of the MIP images confirms the above findings CT Brain Perfusion Findings: ASPECTS: 10 CBF (<30%) Volume: 41m Perfusion (Tmax>6.0s) volume: 1573mMismatch Volume: 13683mnfarction Location:Right frontal operculum IMPRESSION: Embolic occlusion at the right carotid terminus. Minimal flow seen in the right MCA territory. Right ACA territory appears normally perfused, probably due to a patent anterior communicating artery. 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 Large penumbra, 136 cc in the remainder of the right MCA territory. No aortic atherosclerotic disease seen. No atherosclerotic disease at either carotid bifurcation. Some atherosclerotic narrowing of the distal vertebral arteries and basilar artery. Preliminary report sent by text at 1238 hours. Discussed with stroke PA 1242 hours. Electronically  Signed   By: MarNelson ChimesD.   On: 07/26/2020 12:48   CT ANGIO NECK CODE STROKE  Result Date: 07/26/2020 CLINICAL DATA:  Acute presentation with left body weakness and speech disturbance. EXAM: CT ANGIOGRAPHY HEAD AND NECK CT PERFUSION BRAIN TECHNIQUE: Multidetector CT imaging of the head and neck was performed using the standard protocol during bolus administration of intravenous contrast. Multiplanar CT image reconstructions and MIPs were obtained to evaluate the vascular anatomy. Carotid stenosis measurements (when applicable) are obtained utilizing NASCET criteria, using the distal internal carotid diameter as the denominator. Multiphase CT imaging of the brain was performed following IV bolus contrast injection. Subsequent parametric perfusion maps were calculated using RAPID software. CONTRAST:  Not annotated COMPARISON:  Head CT earlier same day FINDINGS: CTA NECK FINDINGS Aortic arch: No aortic atherosclerotic calcification seen. No aneurysm or dissection. Branching pattern is normal with wide patency of the origins. Right carotid system: Common carotid artery widely patent to the bifurcation. No atherosclerotic disease at the carotid bifurcation. ICA widely patent. Left carotid system: Common carotid artery widely patent to the bifurcation. Carotid bifurcation is normal without soft or calcified plaque. ICA widely patent. Vertebral arteries: Both vertebral artery origins are widely patent. Both vertebral arteries are normal through the cervical region to the foramen magnum. Skeleton: Ordinary cervical spondylosis. Other neck: No mass or lymphadenopathy. Upper chest: No active chest disease. 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 Review of the MIP images confirms the above findings CTA HEAD FINDINGS Anterior circulation: Complete occlusion of the right carotid terminus. Minimal flow visible in the right MCA territory. Right ACA  receives it supply through the anterior communicating route. No left stenosis or occlusion. Right PCA arises from the carotid artery immediately proximal to the occlusion. Posterior circulation: Both vertebral arteries patent to the basilar. Atherosclerotic narrowing the distal vertebral arteries and of the basilar. Posterior circulation branch vessels are patent. Right PCA receives most of it supply from the anterior circulation. Venous sinuses: Patent and normal. Anatomic variants: None significant.  Review of the MIP images confirms the above findings CT Brain Perfusion Findings: ASPECTS: 10 CBF (<30%) Volume: 19m Perfusion (Tmax>6.0s) volume: 15109mMismatch Volume: 13633mnfarction Location:Right frontal operculum IMPRESSION: Embolic occlusion at the right carotid terminus. Minimal flow seen in the right MCA territory. Right ACA territory appears normally perfused, probably due to a patent anterior communicating artery. 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 Large penumbra, 136 cc in the remainder of the right MCA territory. No aortic atherosclerotic disease seen. No atherosclerotic disease at either carotid bifurcation. Some atherosclerotic narrowing of the distal vertebral arteries and basilar artery. Preliminary report sent by text at 1238 hours. Discussed with stroke PA 1242 hours. Electronically Signed   By: MarNelson ChimesD.   On: 07/26/2020 12:48    Labs:  CBC: Recent Labs    07/03/20 0344 07/03/20 0344 07/26/20 1211 07/26/20 1215 07/27/20 0637 07/28/20 0452  WBC 13.3*  --  6.5  --  11.5* 13.3*  HGB 10.1*   < > 10.7* 12.9 10.2* 10.2*  HCT 33.4*   < > 37.7 38.0 35.9* 34.8*  PLT 313  --  185  --  235 209   < > = values in this interval not displayed.    COAGS: Recent Labs    06/29/20 0318 07/26/20 1211  INR 1.4* 1.1  APTT 35 24    BMP: Recent Labs    07/03/20 0344 07/03/20 0344 07/26/20 1211 07/26/20 1215 07/27/20 0637 07/28/20 0452  NA 134*   < > 137 141 138 136   K 3.6   < > 4.6 4.5 4.6 4.1  CL 108   < > 106 106 108 105  CO2 18*  --  19*  --  22 19*  GLUCOSE 335*   < > 203* 198* 110* 180*  BUN 15   < > 22 25* 19 17  CALCIUM 7.7*  --  9.2  --  8.6* 8.2*  CREATININE 0.98   < > 1.17* 1.10* 0.94 0.95  GFRNONAA 58*  --  47*  --  >60 60*  GFRAA >60  --  54*  --  >60 >60   < > = values in this interval not displayed.    LIVER FUNCTION TESTS: Recent Labs    07/01/20 0556 07/02/20 1825 07/03/20 0344 07/26/20 1211  BILITOT 0.5 0.2* 0.6 1.4*  AST 43* 25 15 20   ALT 26 19 18 16   ALKPHOS 112 109 93 90  PROT 5.3* 5.2* 5.0* 6.8  ALBUMIN 1.9* 1.8* 1.8* 3.7    Assessment and Plan:  Acute CVA s/p cerebral arteriogram with emergent mechanical thrombectomy of right ICA terminus occlusion achieving a TICI 2c/3 revascularization via right femoral approach 07/26/2020 by Dr. DevEstanislado Pandyatient's condition stable- remains intubated/sedated, opens eyes to voice/gentle touch and follows simple commands, moving right side and left side withdraws from pain. Right groin puncture site stable, distal pulses (DPs) 1+ bilaterally. For possible extubation today per RN. Further plans per neurology/CCM- appreciate and agree with management. NIR to follow.   Electronically Signed: AleEarley AbideA-C 07/28/2020, 10:26 AM   I spent a total of 25 Minutes at the the patient's bedside AND on the patient's hospital floor or unit, greater than 50% of which was counseling/coordinating care for CVA s/p revascularization.

## 2020-07-28 NOTE — Progress Notes (Signed)
SLP Cancellation Note  Patient Details Name: Samantha Paul MRN: [id number] DOB: [birth year]   Cancelled treatment:       Reason Eval/Treat Not Completed: Medical issues which prohibited therapy (remains on vent this am). Will f/u as able.   Mahala Menghini., M.A. CCC-SLP Acute Rehabilitation Services Pager [phone] Office [phone]  07/28/2020, 7:18 AM

## 2020-07-28 NOTE — Plan of Care (Signed)
  Problem: Nutrition: Goal: Adequate nutrition will be maintained Outcome: Progressing   

## 2020-07-28 NOTE — Progress Notes (Signed)
PT Cancellation Note  Patient Details Name: Samantha Paul MRN: [id number] DOB: 1946-04-26   Cancelled Treatment:    Reason Eval/Treat Not Completed: Active bedrest order - and significant tachycardia with minimal movement. PT to check back as schedule allows.  Richrd Sox, PT Acute Rehabilitation Services Pager [phone]  Office [phone]    Amarius Toto D Despina Hidden 07/28/2020, 10:05 AM

## 2020-07-28 NOTE — Progress Notes (Signed)
STROKE TEAM PROGRESS NOTE   INTERVAL HISTORY Her husband and her son are at the bedside.   patient remains intubated for respiratory failure but is arousable and follows simple commands on the right side after translation by her husband. She has right gaze preference and does not blink to threat on the left and has left hemiplegia MRI scan of the brain yesterday shows moderate size right MCA as well as right ACA and small right PCA infarcts with trace petechial hemorrhage. Vitals:   07/28/20 1300 07/28/20 1505 07/28/20 1506 07/28/20 1600  BP: 130/76 128/79    Pulse: (!) 54 71    Resp: 20 19    Temp:    (!) 100.4 F (38 C)  TempSrc:    Axillary  SpO2: 100% 100% 100%   Weight:      Height:       CBC:  Recent Labs  Lab 07/26/20 1211 07/26/20 1215 07/27/20 0637 07/28/20 0452  WBC 6.5   < > 11.5* 13.3*  NEUTROABS 4.1  --   --   --   HGB 10.7*   < > 10.2* 10.2*  HCT 37.7   < > 35.9* 34.8*  MCV 75.1*   < > 74.8* 74.2*  PLT 185   < > 235 209   < > = values in this interval not displayed.   Basic Metabolic Panel:  Recent Labs  Lab 07/27/20 0637 07/27/20 0637 07/27/20 1635 07/28/20 0452  NA 138  --   --  136  K 4.6  --   --  4.1  CL 108  --   --  105  CO2 22  --   --  19*  GLUCOSE 110*  --   --  180*  BUN 19  --   --  17  CREATININE 0.94  --   --  0.95  CALCIUM 8.6*  --   --  8.2*  MG 2.0   < > 1.8 2.5*  PHOS  --   --  4.1 3.9   < > = values in this interval not displayed.   Lipid Panel:  Recent Labs  Lab 07/27/20 0637  CHOL 147  TRIG 79  HDL 41  CHOLHDL 3.6  VLDL 16  LDLCALC 90   Lab Results  Component Value Date   HGBA1C 8.7 (H) 07/27/2020   Urine Drug Screen:  Recent Labs  Lab 07/27/20 1254  LABOPIA NONE DETECTED  COCAINSCRNUR NONE DETECTED  LABBENZ NONE DETECTED  AMPHETMU NONE DETECTED  THCU NONE DETECTED  LABBARB NONE DETECTED    Alcohol Level  Recent Labs  Lab 07/26/20 1209  ETH <10    IMAGING past 24 hours MR BRAIN WO  CONTRAST  Result Date: 07/27/2020 CLINICAL DATA:  Stroke follow-up EXAM: MRI HEAD WITHOUT CONTRAST TECHNIQUE: Multiplanar, multiecho pulse sequences of the brain and surrounding structures were obtained without intravenous contrast. COMPARISON:  Brain MRI 07/10/2018 FINDINGS: Brain: Multifocal ischemic infarction within the right MCA territory, greatest at the frontal operculum and insula. There is also cortical ischemia within the right ACA territory and right PCA territory. There is a punctate focus of acute ischemia in the left occipital lobe. Large area of infarction in the right basal ganglia. There is petechial hemorrhage versus is contrast staining in the right basal ganglia as well. Moderate edema associated with the ischemic areas. There is mass effect on the frontal horn of the right lateral ventricle. No herniation. There is an old left pontine infarct. No chronic  microhemorrhage. Normal midline structures. Vascular: Normal flow voids. Skull and upper cervical spine: Normal marrow signal. Sinuses/Orbits: Right maxillary sinus mucosal thickening. Normal orbits. Other: None. IMPRESSION: 1. Multifocal ischemic infarction within the right MCA territory, greatest at the frontal operculum and insula. 2. Additional cortical ischemia within the right ACA territory and right PCA territory. 3. Large area of infarction in the right basal ganglia with petechial hemorrhage versus contrast staining. 4. Punctate focus of acute ischemia in the left occipital lobe. Electronically Signed   By: Deatra Robinson M.D.   On: 07/27/2020 23:50    PHYSICAL EXAM Elderly Asian lady who is intubated sedated. Not in distress. . Afebrile. Head is nontraumatic. Neck is supple without bruit.    Cardiac exam no murmur or gallop. Lungs are clear to auscultation. Distal pulses are well felt. Neurological Exam :  Patient is intubated sedated. Eyes closed but opens with sternal rub.  She follows some simple midline and commands on the  right side after translation by her husband. Right gaze preference. Unable to look to the left. Blinks to threat on the right but not on the left. Pupils were irregular but reactive. Fundi not visualized. Left lower facial weakness. Tongue midline. Purposeful antigravity movements on the right. Left hemiplegia but will withdraw the left lower extremity to pain but not the left upper extremity. Tone is reduced on the left compared to the right. Left plantar upgoing right downgoing. Gait not tested. ASSESSMENT/PLAN Ms. Rubee Bracknell is a 74 y.o. female with history of atrial fibrillation, HTN, DM and stroke presenting with right gaze deviation, left-sided hemiplegia and dense receptive and expressive aphasia. She received tPA 07/26/2020 at 1259. Taken to IR for R ICA T occlusion.  Stroke:   R MCA infarct w/ petechial hemorrhage s/p tPA and IR revascularization, embolic secondary to known AF source on Eliquis  Code Stroke CT head No acute abnormality. Small vessel disease. Atrophy. ASPECTS 10.     CTA head & neck R ICA occlusion w/ min MCA flow. Some narrowing B VA and BA.   CT perfusion R frontal operculum core 16 cc, penumbra 136 R MCA territory  Cerebral angio RT ICA T occlusion with x 1 pass solitaire ->TICI 2C revascularization of RT MCA and a TICI 3 revascularization of RT ACA .  Post IR CT focal contrast stain in the RT globus pallidus. No gross mass effect or shift.   Post IR CT R insular infarct. R caudate head and lentiform nucleus hyperdensity. No hemorrhage.   CT new R frontal lobe hyper and hypointensity (infarct, edema, petechial hemorrhage)  MRI  pending   2D Echo 07/03/2020 EF 65-70%. No source of embolus   LDL 90  HgbA1c 12.7  VTE prophylaxis - SCDs   Eliquis (apixaban) daily prior to admission, now on No antithrombotic. Hold antithrombotic given petechial hemorrhage noted on imaging  Therapy recommendations: Pending  Disposition:  pending   Acute Respiratory  Failure  Secondary to stroke  Intubated for IR, left intubated post IR   No sedation   Hold extubation until MRI completed  CCM on board  Atrial Fibrillation w/ RVR  Home anticoagulation:  Eliquis (apixaban) daily   on diltiazem 240, metoprolol 12.5 bid  at home  Now on cardizem gtt . No AC at this time given petechial hemorrhage   Hypotension  Home meds:  None, no hx HTN . BP goal per IR x 24h following IR procedure and tPA administration.  . Currently on Phenylephrine to meet goal  100-140 x 24h  . Long-term BP goal normotensive  Hyperlipidemia  Home meds:  crestor 20, resumed in hospital  LDL 90, goal < 70  Continue statin at discharge  Diabetes type II Uncontrolled  Home meds:  lantus 20 daily, metformin 500 bid  HgbA1c 12.7, goal < 7.0  CBGs Recent Labs    07/28/20 0729 07/28/20 1139 07/28/20 1547  GLUCAP 168*  168* 174* 240*      SSI  Dysphagia . Secondary to stroke . NPO . Speech on board   Other Stroke Risk Factors  Advanced age  Hx stroke/TIA  06/2018 left pontine infarct likely secondary to small vessel disease source. DAPT x 3 weeks.  Other Active Problems  Microcytic anemia 10.2   Leukocytosis 11.5  Hospital day # 2 Patient prognosis is guarded given significant infarcts on MRI and persistent right gaze preference and dense left hemiplegia.  I had a long discussion the patient's husband he was in the interpreter via telephone and answered questions.  Family may need to decide about one-way extubation versus continuing aggressive care and tracheostomy and PEG tube if necessary.  Discussed with Dr. Lynnell Catalan critical care medicine.  This patient is critically ill and at significant risk of neurological worsening, death and care requires constant monitoring of vital signs, hemodynamics,respiratory and cardiac monitoring, extensive review of multiple databases, frequent neurological assessment, discussion with family, other  specialists and medical decision making of high complexity.I have made any additions or clarifications directly to the above note.This critical care time does not reflect procedure time, or teaching time or supervisory time of PA/NP/Med Resident etc but could involve care discussion time.  I spent 32 minutes of neurocritical care time  in the care of  this patient.  Delia Heady, MD  To contact Stroke Continuity provider, please refer to [email or web address]. After hours, contact General Neurology

## 2020-07-28 NOTE — Progress Notes (Signed)
NAMEMarja Paul, MRN:  [id number], DOB:  11-02-1946, LOS: 2 ADMISSION DATE:  07/26/2020, CONSULTATION DATE:  07/26/20 REFERRING MD:  Dr. Estanislado Pandy, CHIEF COMPLAINT:  L sided weakness   Brief History   74 y/o F admitted on 8/1 with R ACA/MCA occulusion with L sided hemiparesis and aphasia. s/p Neuro IR Revascularization.    Significant Hospital Events   8/1: Admit with R MCA / ACA occlusion s/p revascularization  8/2: O/N Hypotensive Phenylephrine IV started    Consults: date of consult/date signed off & final recs:  07/26/20: PCCM Procedures (surgical and bedside):  8/1 >> ETT  8/1 >> L Radial Aline Significant Diagnostic Tests:   ECHO 07/03/20 >> LVEF 65-70%, narrow LVOT, turbulent flow through the LV/LVOT, no RWMA, mild LVH, RV systolic function normal, normal PASP.  CT Code Stroke 8/1 >> no acute CT finding, chronic small vessel ischemic changes  CTA Head / Neck 8/1 >> embolic occlusion at the right carotid terminus. Minimal flow seen in the right MCA territory. Right ACA appears normally perfused, probably due to a patent anterior communicating artery.  Core infarct right frontal operculum 16cc, large penumbra, 136 cc in the remainder of the R MCA territory. No atherosclerotic disease at either carotid bifurcation.    CT Head 8/2: >>New mass effect in the anterior right frontal lobe with regional   increasing mixed hypo- and hyperdensity. Favor a combination of   anterior right MCA territory cytotoxic edema and petechial hemorrhage. No malignant   hemorrhage.    MR Brain wo contrast 8/2>>Multifocal ischemic infarction within the R MCA Territory,   greatest at the frontal operculum and insula. Additional cortical ischemia within the R ACA   territory and R PCA territory. Large area of infarction in the R basal ganglia with petechial   hemorrhage vs contrast staining. Punctate focus of acute ischemia in L occipital lobe.   Subjective:  Ms. Mateus was seen and evaluated at bedside this AM. She  was able to follow commands after repeated iterations. Appears more drowsy than yesterday.   Objective   Blood pressure (!) 133/97, pulse 85, temperature 98.6 F (37 C), temperature source Axillary, resp. rate (!) 21, height 5' 2"  (1.575 m), weight 60.7 kg, SpO2 100 %.    Vent Mode: PSV;CPAP FiO2 (%):  [40 %] 40 % Set Rate:  [16 bmp] 16 bmp Vt Set:  [400 mL] 400 mL PEEP:  [5 cmH20] 5 cmH20 Pressure Support:  [10 cmH20] 10 cmH20 Plateau Pressure:  [11 cmH20-20 cmH20] 16 cmH20   Intake/Output Summary (Last 24 hours) at 07/28/2020 0748 Last data filed at 07/28/2020 0600 Gross per 24 hour  Intake 2586.79 ml  Output 1050 ml  Net 1536.79 ml   Filed Weights   07/26/20 1245 07/26/20 1736  Weight: 58.4 kg 60.7 kg    Examination: Physical Exam Constitutional:      Comments: Follows commands, intubated, tired on appearance  HENT:     Head: Normocephalic and atraumatic.     Right Ear: Tympanic membrane normal.     Left Ear: Tympanic membrane normal.  Cardiovascular:     Rate and Rhythm: Normal rate and regular rhythm.     Resolved Hospital Problem list    Assessment & Plan:  R ACA / MCA Occlusion / Acute CVA S/p revascularization, large penumbra noted on initial CT imaging. 8/2 MRI showed R ACA and PCA cortical ischemia, large area of infarction in the R basal ganglia with petechial hemorrhage vs contrast staining, with  punctate focus of acute ischemia in L occiptial lobee.  -SBP goal 120-160 - Crestor 20 mg QD - Recommendations per Neuro  Acute Respiratory Insufficiency in setting of Acute CVA Requiring Mechanical Ventilation More sedated today compared to yesterday, but still follows commands. Will DC Versed and Fentanyl today in order to arouse patient for possible extubation.  -PRVC 8cc/kg, rate 16 -Wean PEEP / FiO2 for sats > 90% -Daily SBT  -follow intermittent CXR   Atrial Fibrillation  On cardizem, metoprolol, eliquis prior to admit, CHADS-VASC 6 -no anticoagulation  / rate control for now with risk of hemorrhagic conversion  - tele monitoring  - Diltiazem 60 mg suspension Q6H, Dilt drip stopped. - increase Lopressor to 50 mg BID  Hypotension:  - Holding phenylephrine - Continue to monitor BP with goals of MAP >65   Microcytic Anemia  - QD CBC - Hgb 10.2<10.2  DM II with Hyperglycemia  -SSI  - A1c 8.7  Disposition / Summary of Today's Plan 07/28/20   Stopping drips today with transition to oral medications. Possible extubation if patient is more alert.      Nutritional status and diet: NPO Pain/Anxiety/Delirium reduction: PAD VAP protocol (if indicated) Yes DVT prophylaxis: SCDs GI prophylaxis: PPI Hyperglycemia protocol: SSI Mobility:Bed bound Code Status: FULL Family Communication: Per primary   Labs and Ancillary Testing (personally reviewed)  CBC: CBC Latest Ref Rng & Units 07/28/2020 07/27/2020 07/26/2020  WBC 4.0 - 10.5 K/uL 13.3(H) 11.5(H) -  Hemoglobin 12.0 - 15.0 g/dL 10.2(L) 10.2(L) 12.9  Hematocrit 36 - 46 % 34.8(L) 35.9(L) 38.0  Platelets 150 - 400 K/uL 209 235 -    Chemistry: CMP Latest Ref Rng & Units 07/28/2020 07/27/2020 07/26/2020  Glucose 70 - 99 mg/dL 180(H) 110(H) 198(H)  BUN 8 - 23 mg/dL 17 19 25(H)  Creatinine 0.44 - 1.00 mg/dL 0.95 0.94 1.10(H)  Sodium 135 - 145 mmol/L 136 138 141  Potassium 3.5 - 5.1 mmol/L 4.1 4.6 4.5  Chloride 98 - 111 mmol/L 105 108 106  CO2 22 - 32 mmol/L 19(L) 22 -  Calcium 8.9 - 10.3 mg/dL 8.2(L) 8.6(L) -  Total Protein 6.5 - 8.1 g/dL - - -  Total Bilirubin 0.3 - 1.2 mg/dL - - -  Alkaline Phos 38 - 126 U/L - - -  AST 15 - 41 U/L - - -  ALT 0 - 44 U/L - - -   ABG:    Component Value Date/Time   PHART 7.373 07/26/2020 1720   PCO2ART 31.1 (L) 07/26/2020 1720   PO2ART 210 (H) 07/26/2020 1720   HCO3 17.8 (L) 07/26/2020 1720   TCO2 21 (L) 07/26/2020 1215   ACIDBASEDEF 6.6 (H) 07/26/2020 1720   O2SAT 99.4 07/26/2020 1720    CBG: CBG (last 3)  Recent Labs    07/28/20 0006  07/28/20 0323 07/28/20 0729  GLUCAP 113* 182* 168*     Microbiology:  8/1>>MRSA PCR Screening: Negative  8/1>> SARS Coronavirus: Negative  Review of Systems:   N/A as patient is on mechanical ventilator  Past medical history  She,  has a past medical history of Afib ([place]) (06/2020), Back pain, CVA (cerebral vascular accident) ([place]) (07/26/2020), DM II (diabetes mellitus, type II), controlled ([place]), HTN (hypertension), Knee pain, and Stroke ([place]) (?).   Surgical History    Past Surgical History:  Procedure Laterality Date  . CHOLECYSTECTOMY N/A 06/30/2020   Procedure: LAPAROSCOPIC CHOLECYSTECTOMY;  Surgeon: Georganna Skeans, MD;  Location: [place];  Service: General;  Laterality: N/A;  .  RADIOLOGY WITH ANESTHESIA N/A 07/26/2020   Procedure: IR WITH ANESTHESIA;  Surgeon: Radiologist, Medication, MD;  Location: [place];  Service: Radiology;  Laterality: N/A;     Social History   Social History   Socioeconomic History  . Marital status: Married    Spouse name: Not on file  . Number of children: Not on file  . Years of education: Not on file  . Highest education level: Not on file  Occupational History  . Not on file  Tobacco Use  . Smoking status: Never Smoker  . Smokeless tobacco: Never Used  Substance and Sexual Activity  . Alcohol use: Not Currently  . Drug use: Not Currently  . Sexual activity: Not on file  Other Topics Concern  . Not on file  Social History Narrative  . Not on file   Social Determinants of Health   Financial Resource Strain:   . Difficulty of Paying Living Expenses:   Food Insecurity:   . Worried About Charity fundraiser in the Last Year:   . Arboriculturist in the Last Year:   Transportation Needs:   . Film/video editor (Medical):   Marland Kitchen Lack of Transportation (Non-Medical):   Physical Activity:   . Days of Exercise per Week:   . Minutes of Exercise per Session:   Stress:   . Feeling of Stress :   Social Connections:   . Frequency of  Communication with Friends and Family:   . Frequency of Social Gatherings with Friends and Family:   . Attends Religious Services:   . Active Member of Clubs or Organizations:   . Attends Archivist Meetings:   Marland Kitchen Marital Status:   Intimate Partner Violence:   . Fear of Current or Ex-Partner:   . Emotionally Abused:   Marland Kitchen Physically Abused:   . Sexually Abused:   ,  reports that she has never smoked. She has never used smokeless tobacco. She reports previous alcohol use. She reports previous drug use.   Family history   Her family history includes Arthritis in her brother.   Allergies Allergies  Allergen Reactions  . Metformin And Related     Abdominal pain and diarrhea    Home meds  Prior to Admission medications   Medication Sig Start Date End Date Taking? Authorizing Provider  acetaminophen (TYLENOL) 325 MG tablet Take 2-3 tablets (650-975 mg total) by mouth every 6 (six) hours as needed for mild pain. 07/03/20   Jill Alexanders, PA-C  apixaban (ELIQUIS) 5 MG TABS tablet Take 1 tablet (5 mg total) by mouth 2 (two) times daily. 07/03/20   Oswald Hillock, MD  blood glucose meter kit and supplies KIT Dispense based on patient and insurance preference. Use up to four times daily as directed. (FOR ICD-10--E11.9). 07/25/18   Love, Ivan Anchors, PA-C  diltiazem (CARTIA XT) 240 MG 24 hr capsule Take 1 capsule (240 mg total) by mouth daily. 07/03/20 07/03/21  Oswald Hillock, MD  insulin glargine (LANTUS) 100 UNIT/ML Solostar Pen Inject 20 Units into the skin daily. 07/03/20   Oswald Hillock, MD  Insulin Pen Needle (PEN NEEDLES) 32G X 4 MM MISC Use as directed with insulin pen 07/03/20   Oswald Hillock, MD  metFORMIN (GLUCOPHAGE) 500 MG tablet Take 1 tablet (500 mg total) by mouth 2 (two) times daily with a meal. 07/03/20 07/03/21  Oswald Hillock, MD  metoprolol tartrate (LOPRESSOR) 25 MG tablet Take 0.5 tablets (12.5 mg total)  by mouth 2 (two) times daily. 07/03/20   Oswald Hillock, MD  oxyCODONE (OXY  IR/ROXICODONE) 5 MG immediate release tablet Take 1 tablet (5 mg total) by mouth every 6 (six) hours as needed for moderate pain or severe pain (pain not releived by tylenol or ibuprofen). 07/03/20   Jill Alexanders, PA-C  rosuvastatin (CRESTOR) 20 MG tablet TAKE 1 TABLET(20 MG) BY MOUTH DAILY 12/20/18   Frann Rider, NP  senna-docusate (SENOKOT-S) 8.6-50 MG tablet Take 2 tablets by mouth at bedtime. 07/25/18   Bary Leriche, PA-C

## 2020-07-28 NOTE — Progress Notes (Signed)
OT Cancellation Note  Patient Details Name: Samantha Paul MRN: [id number] DOB: Aug 28, 1946   Cancelled Treatment:    Reason Eval/Treat Not Completed: Active bedrest order  [facility] Joncarlo Friberg, OT/L   Acute OT Clinical Specialist Acute Rehabilitation Services Pager [phone] Office [phone]  07/28/2020, 7:37 AM

## 2020-07-28 NOTE — Discharge Instructions (Addendum)
Femoral Site Care This sheet gives you information about how to care for yourself after your procedure. Your health care provider may also give you more specific instructions. If you have problems or questions, contact your health care provider. What can I expect after the procedure? After the procedure, it is common to have:  Bruising that usually fades within 1-2 weeks.  Tenderness at the site. Follow these instructions at home: Wound care 1. Follow instructions from your health care provider about how to take care of your insertion site. Make sure you: ? Wash your hands with soap and water before you change your bandage (dressing). If soap and water are not available, use hand sanitizer. ? Change your dressing as directed- pressure dressing removed 24 hours post-procedure (and switch for bandaid), bandaid removed 72 hours post-procedure 2. Do not take baths, swim, or use a hot tub for 7 days post-procedure. 3. You may shower 48 hours after the procedure or as told by your health care provider. ? Gently wash the site with plain soap and water. ? Pat the area dry with a clean towel. ? Do not rub the site. This may cause bleeding. 4. Check your femoral site every day for signs of infection. Check for: ? Redness, swelling, or pain. ? Fluid or blood. ? Warmth. ? Pus or a bad smell. Activity  Do not stoop, bend, or lift anything that is heavier than 10 lb (4.5 kg) for 2 weeks post-procedure.  Do not drive self for 2 weeks post-procedure. Contact a health care provider if you have:  A fever or chills.  You have redness, swelling, or pain around your insertion site. Get help right away if:  The catheter insertion area swells very fast.  You pass out.  You suddenly start to sweat or your skin gets clammy.  The catheter insertion area is bleeding, and the bleeding does not stop when you hold steady pressure on the area.  The area near or just beyond the catheter insertion site  becomes pale, cool, tingly, or numb. These symptoms may represent a serious problem that is an emergency. Do not wait to see if the symptoms will go away. Get medical help right away. Call your local emergency services (911 in the [place] Do not drive yourself to the hospital.  This information is not intended to replace advice given to you by your health care provider. Make sure you discuss any questions you have with your health care provider. Document Revised: 12/25/2017 Document Reviewed: 12/25/2017 Elsevier Patient Education  2020 [employer].  Information on my medicine - ELIQUIS (apixaban)  This medication education was reviewed with me or my healthcare representative as part of my discharge preparation.  The pharmacist that spoke with me during my hospital stay was:  Ulyses Southward, RPH-CPP  Why was Eliquis prescribed for you? Eliquis was prescribed for you to reduce the risk of a blood clot forming that can cause a stroke if you have a medical condition called atrial fibrillation (a type of irregular heartbeat).  What do You need to know about Eliquis ? Take your Eliquis TWICE DAILY - one tablet in the morning and one tablet in the evening with or without food. If you have difficulty swallowing the tablet whole please discuss with your pharmacist how to take the medication safely.  Take Eliquis exactly as prescribed by your doctor and DO NOT stop taking Eliquis without talking to the doctor who prescribed the medication.  Stopping may increase your risk of  developing a stroke.  Refill your prescription before you run out.  After discharge, you should have regular check-up appointments with your healthcare provider that is prescribing your Eliquis.  In the future your dose may need to be changed if your kidney function or weight changes by a significant amount or as you get older.  What do you do if you miss a dose? If you miss a dose, take it as soon as you remember on the same day  and resume taking twice daily.  Do not take more than one dose of ELIQUIS at the same time to make up a missed dose.  Important Safety Information A possible side effect of Eliquis is bleeding. You should call your healthcare provider right away if you experience any of the following: ? Bleeding from an injury or your nose that does not stop. ? Unusual colored urine (red or dark brown) or unusual colored stools (red or black). ? Unusual bruising for unknown reasons. ? A serious fall or if you hit your head (even if there is no bleeding).  Some medicines may interact with Eliquis and might increase your risk of bleeding or clotting while on Eliquis. To help avoid this, consult your healthcare provider or pharmacist prior to using any new prescription or non-prescription medications, including herbals, vitamins, non-steroidal anti-inflammatory drugs (NSAIDs) and supplements.  This website has more information on Eliquis (apixaban): http://www.eliquis.com/eliquis/home

## 2020-07-29 ENCOUNTER — Encounter (HOSPITAL_COMMUNITY): Payer: Self-pay

## 2020-07-29 ENCOUNTER — Inpatient Hospital Stay (HOSPITAL_COMMUNITY): Payer: Medicare Other

## 2020-07-29 DIAGNOSIS — I6601 Occlusion and stenosis of right middle cerebral artery: Secondary | ICD-10-CM | POA: Diagnosis not present

## 2020-07-29 HISTORY — PX: IR ANGIO VERTEBRAL SEL SUBCLAVIAN INNOMINATE UNI R MOD SED: IMG5365

## 2020-07-29 LAB — BASIC METABOLIC PANEL
Anion gap: 10 (ref 5–15)
BUN: 22 mg/dL (ref 8–23)
CO2: 21 mmol/L — ABNORMAL LOW (ref 22–32)
Calcium: 8.2 mg/dL — ABNORMAL LOW (ref 8.9–10.3)
Chloride: 104 mmol/L (ref 98–111)
Creatinine, Ser: 0.92 mg/dL (ref 0.44–1.00)
GFR calc Af Amer: >60 mL/min (ref >60)
GFR calc non Af Amer: >60 mL/min (ref >60)
Glucose, Bld: 196 mg/dL — ABNORMAL HIGH (ref 70–99)
Potassium: 4.1 mmol/L (ref 3.5–5.1)
Sodium: 135 mmol/L (ref 135–145)

## 2020-07-29 LAB — CBC
HCT: 35.2 % — ABNORMAL LOW (ref 36.0–46.0)
Hemoglobin: 10.1 g/dL — ABNORMAL LOW (ref 12.0–15.0)
MCH: 21.1 pg — ABNORMAL LOW (ref 26.0–34.0)
MCHC: 28.7 g/dL — ABNORMAL LOW (ref 30.0–36.0)
MCV: 73.6 fL — ABNORMAL LOW (ref 80.0–100.0)
Platelets: 188 10*3/uL (ref 150–400)
RBC: 4.78 MIL/uL (ref 3.87–5.11)
RDW: 15.7 % — ABNORMAL HIGH (ref 11.5–15.5)
WBC: 17.8 10*3/uL — ABNORMAL HIGH (ref 4.0–10.5)
nRBC: 0.2 % (ref 0.0–0.2)

## 2020-07-29 LAB — PHOSPHORUS: Phosphorus: 3.1 mg/dL (ref 2.5–4.6)

## 2020-07-29 LAB — GLUCOSE, CAPILLARY
Glucose-Capillary: 123 mg/dL — ABNORMAL HIGH (ref 70–99)
Glucose-Capillary: 124 mg/dL — ABNORMAL HIGH (ref 70–99)
Glucose-Capillary: 168 mg/dL — ABNORMAL HIGH (ref 70–99)
Glucose-Capillary: 183 mg/dL — ABNORMAL HIGH (ref 70–99)
Glucose-Capillary: 205 mg/dL — ABNORMAL HIGH (ref 70–99)
Glucose-Capillary: 208 mg/dL — ABNORMAL HIGH (ref 70–99)

## 2020-07-29 LAB — MAGNESIUM: Magnesium: 2 mg/dL (ref 1.7–2.4)

## 2020-07-29 MED ORDER — BETHANECHOL CHLORIDE 10 MG PO TABS
10.0000 mg | ORAL_TABLET | Freq: Three times a day (TID) | ORAL | Status: DC
  Filled 2020-07-29: qty 1

## 2020-07-29 MED ORDER — BETHANECHOL CHLORIDE 10 MG PO TABS
10.0000 mg | ORAL_TABLET | Freq: Three times a day (TID) | ORAL | Status: DC
  Administered 2020-07-29 – 2020-08-23 (×75): 10 mg
  Filled 2020-07-29 (×75): qty 1

## 2020-07-29 MED ORDER — ASPIRIN 81 MG PO CHEW: 81.0000 mg | CHEWABLE_TABLET | Freq: Every day | ORAL | Status: DC

## 2020-07-29 MED ORDER — ASPIRIN 81 MG PO CHEW
81.0000 mg | CHEWABLE_TABLET | Freq: Every day | ORAL | Status: DC
  Administered 2020-07-29 – 2020-08-19 (×22): 81 mg
  Filled 2020-07-29 (×22): qty 1

## 2020-07-29 MED ORDER — ASPIRIN EC 81 MG PO TBEC: 81.0000 mg | DELAYED_RELEASE_TABLET | Freq: Every day | ORAL | Status: DC

## 2020-07-29 NOTE — Progress Notes (Signed)
NAMEBaily Paul, MRN:  [id number], DOB:  1946-11-19, LOS: 3 ADMISSION DATE:  07/26/2020, CONSULTATION DATE:  07/26/20 REFERRING MD:  Dr. Estanislado Pandy, CHIEF COMPLAINT:  L sided weakness   Brief History   74 y/o F admitted on 8/1 with R ACA/MCA occulusion with L sided hemiparesis and aphasia. s/p Neuro IR Revascularization.   Significant Hospital Events   8/1: Admit with R MCA / ACA occlusion s/p revascularization  8/2: O/N Hypotensive Phenylephrine IV started    Consults: date of consult/date signed off & final recs:  07/26/20: PCCM Procedures (surgical and bedside):  8/1 >> ETT  8/1 >> L Radial Aline Significant Diagnostic Tests:   ECHO 07/03/20 >> LVEF 65-70%, narrow LVOT, turbulent flow through the LV/LVOT, no RWMA, mild LVH, RV systolic function normal, normal PASP.  CT Code Stroke 8/1 >> no acute CT finding, chronic small vessel ischemic changes  CTA Head / Neck 8/1 >> embolic occlusion at the right carotid terminus. Minimal flow seen in the right MCA territory. Right ACA appears normally perfused, probably due to a patent anterior communicating artery.  Core infarct right frontal operculum 16cc, large penumbra, 136 cc in the remainder of the R MCA territory. No atherosclerotic disease at either carotid bifurcation.    CT Head 8/2: >>New mass effect in the anterior right frontal lobe with regional   increasing mixed hypo- and hyperdensity. Favor a combination of   anterior right MCA territory cytotoxic edema and petechial hemorrhage. No malignant   hemorrhage.    MR Brain wo contrast 8/2>>Multifocal ischemic infarction within the R MCA Territory,   greatest at the frontal operculum and insula. Additional cortical ischemia within the R ACA   territory and R PCA territory. Large area of infarction in the R basal ganglia with petechial   hemorrhage vs contrast staining. Punctate focus of acute ischemia in L occipital lobe.   Subjective:  Ms. Bednarski was seen and evaluated at bedside this AM. She  was able to follow commands this morning. Aphasic.   Objective   Blood pressure (!) 92/54, pulse 96, temperature 98.4 F (36.9 C), resp. rate 18, height _0  (1.575 m), weight 62.3 kg, SpO2 100 %.    Vent Mode: CPAP;PSV FiO2 (%):  [40 %] 40 % Set Rate:  [16 bmp] 16 bmp Vt Set:  [400 mL] 400 mL PEEP:  [5 cmH20] 5 cmH20 Pressure Support:  [8 cmH20-10 cmH20] 8 cmH20 Plateau Pressure:  [15 cmH20] 15 cmH20   Intake/Output Summary (Last 24 hours) at 07/29/2020 1034 Last data filed at 07/29/2020 0700 Gross per 24 hour  Intake 1069.38 ml  Output 790 ml  Net 279.38 ml   Filed Weights   07/26/20 1245 07/26/20 1736 07/29/20 0500  Weight: 58.4 kg 60.7 kg 62.3 kg    Examination: Physical Exam Constitutional:      Comments: Follows simple commands. Opens eyes to voice.  HENT:     Mouth/Throat:     Mouth: Mucous membranes are moist.     Pharynx: No oropharyngeal exudate or posterior oropharyngeal erythema.     Comments: ET/NG in place and intact Eyes:     Pupils: Pupils are equal, round, and reactive to light.  Cardiovascular:     Rate and Rhythm: Normal rate and regular rhythm.     Heart sounds: No murmur heard.  No friction rub. No gallop.   Pulmonary:     Effort: Pulmonary effort is normal.     Breath sounds: Normal breath sounds. No wheezing,  rhonchi or rales.  Abdominal:     General: Abdomen is flat. Bowel sounds are normal.  Neurological:     Mental Status: She is alert.     Comments: Able to move RUE and RLE spontaneously, follows simple commands. Unable to move LLE and LUE, but does withdraw from pain on the L side.      Resolved Hospital Problem list    Assessment & Plan:  R ACA / MCA Occlusion / Acute CVA S/p revascularization, large penumbra noted on initial CT imaging. 8/2 MRI showed R ACA and PCA cortical ischemia, large area of infarction in the R basal ganglia with petechial hemorrhage vs contrast staining, with punctate focus of acute ischemia in L occiptial  lobe.  - SBP goal 120-160 - Crestor 20 mg QD - Recommendations per Neuro  Acute Respiratory Failure in setting of Acute CVA Requiring Mechanical Ventilation Approximately similar in disposition today compared to yesterday, but still follows commands. Versed and Fentanyl DC yesterday.  -Wean PEEP / FiO2 for sats > 90% -Daily SBT  -follow intermittent CXR   Atrial Fibrillation: On Cardizem metoprolol, eliquis prior to admit, CHADS-VASC 6 -no anticoagulation / rate control for now with risk of hemorrhagic conversion  - tele monitoring  - Diltiazem 60 mg suspension Q6H, Dilt drip stopped. - Lopressor to 50 mg BID  Hypotension:  - Pressures have been stable since Phenylephrine has been DC'd.  - Continue to monitor BP with goals of MAP >65  And SBP of 100-140  Microcytic Anemia: - QD CBC - Hgb 10.1<10.2  DM II with Hyperglycemia  -SSI  - A1c 8.7  Disposition / Summary of Today's Plan 07/29/20   Drips have been halted, Blood pressure doing well off phenylephrine. Continue to observe patient for possible extubation.       Nutritional status and diet: NPO Pain/Anxiety/Delirium reduction: PAD VAP protocol (if indicated) Yes DVT prophylaxis: SCDs GI prophylaxis: PPI Hyperglycemia protocol: SSI Mobility:Bed bound Code Status: FULL Family Communication: Per primary   Labs and Ancillary Testing (personally reviewed)  CBC: CBC Latest Ref Rng & Units 07/29/2020 07/28/2020 07/27/2020  WBC 4.0 - 10.5 K/uL 17.8(H) 13.3(H) 11.5(H)  Hemoglobin 12.0 - 15.0 g/dL 10.1(L) 10.2(L) 10.2(L)  Hematocrit 36 - 46 % 35.2(L) 34.8(L) 35.9(L)  Platelets 150 - 400 K/uL 188 209 235    Chemistry: CMP Latest Ref Rng & Units 07/29/2020 07/28/2020 07/27/2020  Glucose 70 - 99 mg/dL 196(H) 180(H) 110(H)  BUN 8 - 23 mg/dL _0 Creatinine 0.44 - 1.00 mg/dL 0.92 0.95 0.94  Sodium 135 - 145 mmol/L 135 136 138  Potassium 3.5 - 5.1 mmol/L 4.1 4.1 4.6  Chloride 98 - 111 mmol/L 104 105 108  CO2 22 - 32 mmol/L  21(L) 19(L) 22  Calcium 8.9 - 10.3 mg/dL 8.2(L) 8.2(L) 8.6(L)  Total Protein 6.5 - 8.1 g/dL - - -  Total Bilirubin 0.3 - 1.2 mg/dL - - -  Alkaline Phos 38 - 126 U/L - - -  AST 15 - 41 U/L - - -  ALT 0 - 44 U/L - - -   ABG:    Component Value Date/Time   PHART 7.373 07/26/2020 1720   PCO2ART 31.1 (L) 07/26/2020 1720   PO2ART 210 (H) 07/26/2020 1720   HCO3 17.8 (L) 07/26/2020 1720   TCO2 21 (L) 07/26/2020 1215   ACIDBASEDEF 6.6 (H) 07/26/2020 1720   O2SAT 99.4 07/26/2020 1720    CBG: CBG (last 3)  Recent Labs  07/28/20 2331 07/29/20 0330 07/29/20 0743  GLUCAP 180* 124* 205*     Microbiology:  8/1>>MRSA PCR Screening: Negative  8/1>> SARS Coronavirus: Negative  Review of Systems:   N/A as patient is on mechanical ventilator  Past medical history  She,  has a past medical history of Afib ([place]) (06/2020), Back pain, CVA (cerebral vascular accident) ([place]) (07/26/2020), DM II (diabetes mellitus, type II), controlled ([place]), HTN (hypertension), Knee pain, and Stroke ([place]) (?).   Surgical History    Past Surgical History:  Procedure Laterality Date  . CHOLECYSTECTOMY N/A 06/30/2020   Procedure: LAPAROSCOPIC CHOLECYSTECTOMY;  Surgeon: Georganna Skeans, MD;  Location: [facility];  Service: General;  Laterality: N/A;  . IR ANGIO VERTEBRAL SEL SUBCLAVIAN INNOMINATE UNI R MOD SED  07/29/2020  . IR PERCUTANEOUS ART THROMBECTOMY/INFUSION INTRACRANIAL INC DIAG ANGIO  07/26/2020  . RADIOLOGY WITH ANESTHESIA N/A 07/26/2020   Procedure: IR WITH ANESTHESIA;  Surgeon: Radiologist, Medication, MD;  Location: [facility];  Service: Radiology;  Laterality: N/A;     Social History   Social History   Socioeconomic History  . Marital status: Married    Spouse name: Not on file  . Number of children: Not on file  . Years of education: Not on file  . Highest education level: Not on file  Occupational History  . Not on file  Tobacco Use  . Smoking status: Never Smoker  . Smokeless tobacco: Never Used   Substance and Sexual Activity  . Alcohol use: Not Currently  . Drug use: Not Currently  . Sexual activity: Not on file  Other Topics Concern  . Not on file  Social History Narrative  . Not on file   Social Determinants of Health   Financial Resource Strain:   . Difficulty of Paying Living Expenses:   Food Insecurity:   . Worried About Charity fundraiser in the Last Year:   . Arboriculturist in the Last Year:   Transportation Needs:   . Film/video editor (Medical):   Marland Kitchen Lack of Transportation (Non-Medical):   Physical Activity:   . Days of Exercise per Week:   . Minutes of Exercise per Session:   Stress:   . Feeling of Stress :   Social Connections:   . Frequency of Communication with Friends and Family:   . Frequency of Social Gatherings with Friends and Family:   . Attends Religious Services:   . Active Member of Clubs or Organizations:   . Attends Archivist Meetings:   Marland Kitchen Marital Status:   Intimate Partner Violence:   . Fear of Current or Ex-Partner:   . Emotionally Abused:   Marland Kitchen Physically Abused:   . Sexually Abused:   ,  reports that she has never smoked. She has never used smokeless tobacco. She reports previous alcohol use. She reports previous drug use.   Family history   Her family history includes Arthritis in her brother.   Allergies Allergies  Allergen Reactions  . Metformin And Related     Abdominal pain and diarrhea    Home meds  Prior to Admission medications   Medication Sig Start Date End Date Taking? Authorizing Provider  acetaminophen (TYLENOL) 325 MG tablet Take 2-3 tablets (650-975 mg total) by mouth every 6 (six) hours as needed for mild pain. 07/03/20   Jill Alexanders, PA-C  apixaban (ELIQUIS) 5 MG TABS tablet Take 1 tablet (5 mg total) by mouth 2 (two) times daily. 07/03/20   Eleonore Chiquito  S, MD  blood glucose meter kit and supplies KIT Dispense based on patient and insurance preference. Use up to four times daily as directed.  (FOR ICD-10--E11.9). 07/25/18   Love, Ivan Anchors, PA-C  diltiazem (CARTIA XT) 240 MG 24 hr capsule Take 1 capsule (240 mg total) by mouth daily. 07/03/20 07/03/21  Oswald Hillock, MD  insulin glargine (LANTUS) 100 UNIT/ML Solostar Pen Inject 20 Units into the skin daily. 07/03/20   Oswald Hillock, MD  Insulin Pen Needle (PEN NEEDLES) 32G X 4 MM MISC Use as directed with insulin pen 07/03/20   Oswald Hillock, MD  metFORMIN (GLUCOPHAGE) 500 MG tablet Take 1 tablet (500 mg total) by mouth 2 (two) times daily with a meal. 07/03/20 07/03/21  Oswald Hillock, MD  metoprolol tartrate (LOPRESSOR) 25 MG tablet Take 0.5 tablets (12.5 mg total) by mouth 2 (two) times daily. 07/03/20   Oswald Hillock, MD  oxyCODONE (OXY IR/ROXICODONE) 5 MG immediate release tablet Take 1 tablet (5 mg total) by mouth every 6 (six) hours as needed for moderate pain or severe pain (pain not releived by tylenol or ibuprofen). 07/03/20   Jill Alexanders, PA-C  rosuvastatin (CRESTOR) 20 MG tablet TAKE 1 TABLET(20 MG) BY MOUTH DAILY 12/20/18   Frann Rider, NP  senna-docusate (SENOKOT-S) 8.6-50 MG tablet Take 2 tablets by mouth at bedtime. 07/25/18   Bary Leriche, PA-C    Maudie Mercury, MD IMTS, PGY-2 Pager: [phone] 07/29/2020,10:34 AM

## 2020-07-29 NOTE — Progress Notes (Signed)
Referring Physician(s): Code stroke- Kerney Elbe  Supervising Physician: Luanne Bras  Patient Status:  [facility] - In-pt  Chief Complaint: None- intubated without sedation  Subjective:  Acute CVA s/p cerebral arteriogram with emergent mechanical thrombectomy of right ICA terminus occlusion achieving a TICI 2c/3 revascularization via right femoral approach 07/26/2020 by Dr. Estanislado Pandy. Patient laying in bed intubated without sedation. She opens eyes to voice/gentle touch and follows simple commands. Family at bedside. Can spontaneously move right side, left side withdraws from pain. Right groin puncture site c/d/i.   Allergies: Metformin and related  Medications: Prior to Admission medications   Medication Sig Start Date End Date Taking? Authorizing Provider  acetaminophen (TYLENOL) 325 MG tablet Take 2-3 tablets (650-975 mg total) by mouth every 6 (six) hours as needed for mild pain. 07/03/20   Jill Alexanders, PA-C  apixaban (ELIQUIS) 5 MG TABS tablet Take 1 tablet (5 mg total) by mouth 2 (two) times daily. 07/03/20   Oswald Hillock, MD  blood glucose meter kit and supplies KIT Dispense based on patient and insurance preference. Use up to four times daily as directed. (FOR ICD-10--E11.9). 07/25/18   Love, Ivan Anchors, PA-C  diltiazem (CARTIA XT) 240 MG 24 hr capsule Take 1 capsule (240 mg total) by mouth daily. 07/03/20 07/03/21  Oswald Hillock, MD  insulin glargine (LANTUS) 100 UNIT/ML Solostar Pen Inject 20 Units into the skin daily. 07/03/20   Oswald Hillock, MD  Insulin Pen Needle (PEN NEEDLES) 32G X 4 MM MISC Use as directed with insulin pen 07/03/20   Oswald Hillock, MD  metFORMIN (GLUCOPHAGE) 500 MG tablet Take 1 tablet (500 mg total) by mouth 2 (two) times daily with a meal. 07/03/20 07/03/21  Oswald Hillock, MD  metoprolol tartrate (LOPRESSOR) 25 MG tablet Take 0.5 tablets (12.5 mg total) by mouth 2 (two) times daily. 07/03/20   Oswald Hillock, MD  oxyCODONE (OXY IR/ROXICODONE) 5 MG immediate  release tablet Take 1 tablet (5 mg total) by mouth every 6 (six) hours as needed for moderate pain or severe pain (pain not releived by tylenol or ibuprofen). 07/03/20   Jill Alexanders, PA-C  rosuvastatin (CRESTOR) 20 MG tablet TAKE 1 TABLET(20 MG) BY MOUTH DAILY Patient taking differently: Take 20 mg by mouth daily.  12/20/18   Frann Rider, NP  senna-docusate (SENOKOT-S) 8.6-50 MG tablet Take 2 tablets by mouth at bedtime. 07/25/18   Bary Leriche, PA-C     Vital Signs: BP (!) 92/54   Pulse 96   Temp (!) 100.5 F (38.1 C) (Axillary)   Resp 18   Ht 5' 2" (1.575 m)   Wt 137 lb 5.6 oz (62.3 kg)   SpO2 100%   BMI 25.12 kg/m   Physical Exam Vitals and nursing note reviewed.  Constitutional:      General: She is not in acute distress.    Comments: Intubated without sedation.  Pulmonary:     Effort: Pulmonary effort is normal. No respiratory distress.     Comments: Intubated without sedation. Skin:    General: Skin is warm and dry.     Comments: Right groin puncture site soft without active bleeding or hematoma.  Neurological:     Comments: Intubated without sedation. She opens eyes to voice/gentle touch and follows simple commands. PERRL bilaterally. Right gaze preference. Can spontaneously move right side (wiggles toes of LLE, left hand grip strong, lifts LUE off bed but cannot hold), left side withdraws from pain. Distal  pulses (DPs) 1+ bilaterally.      Imaging: CT HEAD WO CONTRAST  Addendum Date: 07/27/2020   ADDENDUM REPORT: 07/27/2020 13:27 ADDENDUM: Study discussed by telephone with Dr. Leonie Man on 07/27/2020 at 1313 hours. He advised that follow-up MRI is pending. Electronically Signed   By: Genevie Ann M.D.   On: 07/27/2020 13:27   Result Date: 07/27/2020 CLINICAL DATA:  74 year old female status post code stroke presentation with right ICA terminus occlusion. Status post IV tPA, endovascular revascularization. EXAM: CT HEAD WITHOUT CONTRAST TECHNIQUE: Contiguous axial  images were obtained from the base of the skull through the vertex without intravenous contrast. COMPARISON:  Post treatment plain head CT, CTA head, CT Perfusion, presentation head CT yesterday. FINDINGS: Brain: Mass effect now on the right lateral ventricle, especially the right frontal horn (series 6, image 13) with mixed hypo- and hyper density in the anterior right frontal lobe, right inferior frontal gyrus and right basal ganglia (series 6, image 13). No significant midline shift. No extra-axial hemorrhage or ventriculomegaly. Superimposed dystrophic basal ganglia calcifications are stable. Posterior to the anterior genu gray-white matter differentiation appears largely stable. There is possible early cytotoxic edema at the posterior operculum. Stable left hemisphere and posterior fossa gray-white matter differentiation. No other intracranial blood products. Vascular: Calcified atherosclerosis at the skull base. No suspicious intracranial vascular hyperdensity. Skull: No acute osseous abnormality identified. Sinuses/Orbits: Intubated, small volume of fluid layering in the pharynx. Mild paranasal sinus fluid or mucosal thickening. Tympanic cavities and mastoids remain clear. Other: Disconjugate gaze now.  Scalp soft tissues remain negative. IMPRESSION: 1. New mass effect in the anterior right frontal lobe with regional increasing mixed hypo- and hyperdensity. Favor a combination of anterior right MCA territory cytotoxic edema and petechial hemorrhage. 2. No malignant hemorrhagic transformation at this time. No midline shift or loss of basilar cisterns. 3. Serial noncontrast Head CT for surveillance of mass effect recommended. Electronically Signed: By: Genevie Ann M.D. On: 07/27/2020 12:34   CT HEAD WO CONTRAST  Result Date: 07/26/2020 CLINICAL DATA:  Stroke, follow-up post revascularization. EXAM: CT HEAD WITHOUT CONTRAST TECHNIQUE: Contiguous axial images were obtained from the base of the skull through the  vertex without intravenous contrast. COMPARISON:  CT angiogram head/neck and CT perfusion 07/26/2020, noncontrast head CT 07/26/2020 FINDINGS: Brain: Redemonstrated mineralization within the bilateral basal ganglia. There is subtle loss of gray-white differentiation within the right insula consistent with acute ischemic infarction. Additionally, there is subtle asymmetric hyperdensity of the right caudate and lentiform nuclei which may reflect contrast staining/enhancement at site of acute infarction. No convincing evidence of acute intracranial hemorrhage. Stable background generalized parenchymal atrophy and chronic small vessel ischemic disease. No extra-axial fluid collection. No evidence of intracranial mass. No midline shift. Vascular: Residual circulating contrast material limits evaluation for hyperdense vessels Skull: Normal. Negative for fracture or focal lesion. Sinuses/Orbits: Visualized orbits show no acute finding. Mild ethmoid and maxillary sinus mucosal thickening at the imaged levels. No significant mastoid effusion IMPRESSION: Subtle changes of acute ischemic infarction within the right insula. Additionally, there is subtle asymmetric hyperdensity of the right caudate and lentiform nuclei which may reflect contrast staining or enhancement at site of acute infarction. No convincing evidence of acute intracranial hemorrhage. Stable background generalized parenchymal atrophy and chronic small vessel ischemic disease. Electronically Signed   By: Kellie Simmering DO   On: 07/26/2020 15:47   MR BRAIN WO CONTRAST  Result Date: 07/27/2020 CLINICAL DATA:  Stroke follow-up EXAM: MRI HEAD WITHOUT CONTRAST TECHNIQUE: Multiplanar, multiecho pulse  sequences of the brain and surrounding structures were obtained without intravenous contrast. COMPARISON:  Brain MRI 07/10/2018 FINDINGS: Brain: Multifocal ischemic infarction within the right MCA territory, greatest at the frontal operculum and insula. There is also  cortical ischemia within the right ACA territory and right PCA territory. There is a punctate focus of acute ischemia in the left occipital lobe. Large area of infarction in the right basal ganglia. There is petechial hemorrhage versus is contrast staining in the right basal ganglia as well. Moderate edema associated with the ischemic areas. There is mass effect on the frontal horn of the right lateral ventricle. No herniation. There is an old left pontine infarct. No chronic microhemorrhage. Normal midline structures. Vascular: Normal flow voids. Skull and upper cervical spine: Normal marrow signal. Sinuses/Orbits: Right maxillary sinus mucosal thickening. Normal orbits. Other: None. IMPRESSION: 1. Multifocal ischemic infarction within the right MCA territory, greatest at the frontal operculum and insula. 2. Additional cortical ischemia within the right ACA territory and right PCA territory. 3. Large area of infarction in the right basal ganglia with petechial hemorrhage versus contrast staining. 4. Punctate focus of acute ischemia in the left occipital lobe. Electronically Signed   By: Ulyses Jarred M.D.   On: 07/27/2020 23:50   CT CEREBRAL PERFUSION W CONTRAST  Result Date: 07/26/2020 CLINICAL DATA:  Acute presentation with left body weakness and speech disturbance. EXAM: CT ANGIOGRAPHY HEAD AND NECK CT PERFUSION BRAIN TECHNIQUE: Multidetector CT imaging of the head and neck was performed using the standard protocol during bolus administration of intravenous contrast. Multiplanar CT image reconstructions and MIPs were obtained to evaluate the vascular anatomy. Carotid stenosis measurements (when applicable) are obtained utilizing NASCET criteria, using the distal internal carotid diameter as the denominator. Multiphase CT imaging of the brain was performed following IV bolus contrast injection. Subsequent parametric perfusion maps were calculated using RAPID software. CONTRAST:  Not annotated COMPARISON:  Head  CT earlier same day FINDINGS: CTA NECK FINDINGS Aortic arch: No aortic atherosclerotic calcification seen. No aneurysm or dissection. Branching pattern is normal with wide patency of the origins. Right carotid system: Common carotid artery widely patent to the bifurcation. No atherosclerotic disease at the carotid bifurcation. ICA widely patent. Left carotid system: Common carotid artery widely patent to the bifurcation. Carotid bifurcation is normal without soft or calcified plaque. ICA widely patent. Vertebral arteries: Both vertebral artery origins are widely patent. Both vertebral arteries are normal through the cervical region to the foramen magnum. Skeleton: Ordinary cervical spondylosis. Other neck: No mass or lymphadenopathy. Upper chest: No active chest disease. 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 Review of the MIP images confirms the above findings CTA HEAD FINDINGS Anterior circulation: Complete occlusion of the right carotid terminus. Minimal flow visible in the right MCA territory. Right ACA receives it supply through the anterior communicating route. No left stenosis or occlusion. Right PCA arises from the carotid artery immediately proximal to the occlusion. Posterior circulation: Both vertebral arteries patent to the basilar. Atherosclerotic narrowing the distal vertebral arteries and of the basilar. Posterior circulation branch vessels are patent. Right PCA receives most of it supply from the anterior circulation. Venous sinuses: Patent and normal. Anatomic variants: None significant. Review of the MIP images confirms the above findings CT Brain Perfusion Findings: ASPECTS: 10 CBF (<30%) Volume: 32m Perfusion (Tmax>6.0s) volume: 1512mMismatch Volume: 13617mnfarction Location:Right frontal operculum IMPRESSION: Embolic occlusion at the right carotid terminus.  Minimal flow seen in the right MCA territory. Right ACA  territory appears normally perfused, probably due to a patent anterior communicating artery. 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 Large penumbra, 136 cc in the remainder of the right MCA territory. No aortic atherosclerotic disease seen. No atherosclerotic disease at either carotid bifurcation. Some atherosclerotic narrowing of the distal vertebral arteries and basilar artery. Preliminary report sent by text at 1238 hours. Discussed with stroke PA 1242 hours. Electronically Signed   By: Nelson Chimes M.D.   On: 07/26/2020 12:48   DG Chest Port 1 View  Result Date: 07/27/2020 CLINICAL DATA:  History of stroke and hypertension EXAM: PORTABLE CHEST 1 VIEW COMPARISON:  07/26/2020 FINDINGS: Endotracheal and enteric tubes are unchanged in position. Shallow inspiration. Cardiac enlargement. No vascular congestion or edema. Lungs are clear with improvement since prior study. No effusions. Surgical clips in the right upper quadrant. IMPRESSION: Cardiac enlargement. No evidence of active pulmonary disease. Improvement since prior study. Electronically Signed   By: Lucienne Capers M.D.   On: 07/27/2020 06:35   DG CHEST PORT 1 VIEW  Result Date: 07/26/2020 CLINICAL DATA:  Inhibition EXAM: PORTABLE CHEST 1 VIEW COMPARISON:  Chest x-ray dated 06/29/2020. FINDINGS: Endotracheal tube appears well positioned with tip approximately 3 cm above the carina. Enteric tube passes below the diaphragm. Cardiomegaly. Central pulmonary vascular congestion. No pleural effusion or pneumothorax is seen. IMPRESSION: 1. Endotracheal tube appears well positioned with tip approximately 3 cm above the carina. 2. Cardiomegaly with central pulmonary vascular congestion indicating CHF/volume overload. Electronically Signed   By: Franki Cabot M.D.   On: 07/26/2020 16:58   DG Abd Portable 1V  Result Date: 07/26/2020 CLINICAL DATA:  OG tube placement EXAM: PORTABLE ABDOMEN - 1 VIEW COMPARISON:  None. FINDINGS: Enteric tube appears well  positioned in the stomach. Visualized bowel gas pattern is nonobstructive. Cholecystectomy clips in the RIGHT upper quadrant. IMPRESSION: Enteric tube appears well positioned in the stomach. Electronically Signed   By: Franki Cabot M.D.   On: 07/26/2020 16:57   IR PERCUTANEOUS ART THROMBECTOMY/INFUSION INTRACRANIAL INC DIAG ANGIO  Result Date: 07/28/2020 INDICATION: Acute onset of left-sided hemiplegia, right gaze deviation and slurred speech. Occluded right internal carotid artery supraclinoid segment, right middle cerebral artery and the right anterior cerebral artery. EXAM: 1. EMERGENT LARGE VESSEL OCCLUSION THROMBOLYSIS (anterior CIRCULATION) COMPARISON:  CT angiogram of the head and neck of July 26, 2020. MEDICATIONS: Ancef 2 g IV antibiotic was administered within 1 hour of the procedure. ANESTHESIA/SEDATION: General anesthesia CONTRAST:  Isovue 300 approximately 100 mL FLUOROSCOPY TIME:  Fluoroscopy Time: 38 minutes 36 seconds (1410 mGy). COMPLICATIONS: None immediate. TECHNIQUE: Following a full explanation of the procedure along with the potential associated complications, an informed witnessed consent was obtained the patient's spouse via an interpreter. The risks of intracranial hemorrhage of 10%, worsening neurological deficit, ventilator dependency, death and inability to revascularize were all reviewed in detail with the patient's spouse. The patient was then put under general anesthesia by the Department of Anesthesiology at [facility]. The right groin was prepped and draped in the usual sterile fashion. Thereafter using modified Seldinger technique, transfemoral access into the right common femoral artery was obtained without difficulty. Over a 0.035 inch guidewire a 5 French Pinnacle sheath was inserted. Through this, and also over a 0.035 inch guidewire a 8 French 25 cm catheter was advanced to the aortic arch region and selectively positioned in the innominate artery and right common  carotid artery. FINDINGS: Innominate  arteriogram demonstrates the origin of the right subclavian artery and the right common carotid artery to be widely patent. Moderate tortuosity is seen of the right common carotid artery proximally. The right vertebral artery origin is widely patent. The vessel is seen to opacify to the cranial skull base. The opacified portion of the basilar artery on the AP projection demonstrates patency with suggestion of a 50% stenosis of the right vertebrobasilar junction. The right common carotid arteriogram demonstrates the right external carotid artery and its major branches to be widely patent. The right internal carotid artery at the bulb to the cranial skull base is patent with moderate severe tortuosity involving the mid right M1 segment associated with a 50% stenosis. The petrous and proximal cavernous segments are widely patent. There is complete angiographic occlusion of the right internal carotid artery just distal to the origin of the right posterior communicating artery supplying the right posterior cerebral artery distribution. PROCEDURE: The diagnostic JB 1 catheter in the right common carotid artery was exchanged over a 0.035 inch 300 cm Rosen exchange guidewire for an 087 95 cm balloon guide catheter which had been prepped with 50% contrast and 50% heparinized saline infusion. Guidewire was removed. Good aspiration obtained from the hub of the balloon guide catheter. A gentle contrast injection demonstrated no evidence of vasospasm, or of intraluminal filling defects. Over a 0.014 inch standard Synchro micro guidewire, an 027 160 cm Marksman microcatheter inside of an 071 135 cm Zoom aspiration catheter was advanced without difficulty to the distal cavernous segment of the right internal carotid artery. The micro guidewire was then gently manipulated without difficulty and advanced into the inferior division of the right middle cerebral artery M2 M3 region followed by the  microcatheter. The guidewire was removed. Mild resistance was again to aspiration which cleared with more proximal position of the microcatheter. A gentle control arteriogram performed through the microcatheter demonstrated safe position of tip of the microcatheter, which was connected to continuous heparinized saline infusion. A 3 mm x 20 mm Solitaire X retrieval device was then advanced to the distal end of the microcatheter. The O ring on the delivery microcatheter was loosened. With slight forward gentle traction with the right hand on the delivery micro guidewire with the left hand the delivery microcatheter was retrieved unsheathing the retrieval device. The 071 aspiration catheter was engaged in the occluded right middle cerebral artery M1 segment. Continued aspiration was applied using an aspiration device with proximal flow arrest in the right internal carotid artery, and aspiration using a 60 mL syringe at the hub of the balloon guide catheter for approximately 2-1/2 minutes. Thereafter, the combination of the retrieval device, the microcatheter, aspiration catheter was retrieved and removed as aspiration was applied at the hub of the balloon guide catheter. Large clot and small pieces were seen from within the aspiration catheter. A control arteriogram performed through the balloon guide following reversal of flow arrest in the right internal carotid artery demonstrated wide patency of the superior and inferior divisions of the right middle cerebral artery. A control arteriogram performed following infusion of 25 mcg of nitroglycerin intra-arterially now demonstrated significantly improved caliber and flow through the superior and inferior divisions. There continued be slow flow involving the M3 M4 branches of the anterior of peri insular branches consistent with the site of the core seen on the CT perfusion studies. A TICI 2C revascularization of the right MCA distribution had been achieved. The right  anterior cerebral artery was now also widely patent  with a TICI 3 revascularization. Patency of the right posterior communicating artery and the right posterior cerebral artery territory was intact. Control arteriogram performed through the balloon guide in the right common carotid artery demonstrated free flow in the right internal carotid artery extra cranially and intracranially. This was then removed. The 8 French Pinnacle sheath in the right groin was replaced with an 8 French Angio-Seal closure device for hemostasis. The distal pulses remained Dopplerable in both the DPs and posterior tibial arteries bilaterally unchanged. Post procedure CT scan of the brain demonstrated contrast blush in the right basal ganglia region. No gross mass effect or midline shift was noted. The patient was left intubated on account of the patient's neurologic condition prior to intubation. She was then transported to the neuro ICU for post thrombectomy care. IMPRESSION: Status post endovascular revascularization of occluded right internal carotid artery supraclinoid segment, the right middle cerebral and the right anterior cerebral artery with 1 pass with a 3 mm x 20 mm Solitaire X retrieval device, contact aspiration and proximal flow arrest achieving a TICI 2C revascularization. PLAN: Follow-up as per referring MD. Electronically Signed   By: Luanne Bras M.D.   On: 07/27/2020 14:02   CT HEAD CODE STROKE WO CONTRAST  Result Date: 07/26/2020 CLINICAL DATA:  Code stroke. Left-sided weakness. Right gaze disturbance. EXAM: CT HEAD WITHOUT CONTRAST TECHNIQUE: Contiguous axial images were obtained from the base of the skull through the vertex without intravenous contrast. COMPARISON:  07/09/2018 FINDINGS: Brain: Age related volume loss. Chronic small-vessel ischemic changes of the brainstem, cerebellum and cerebral hemispheres. Physiologic basal ganglia calcification. No visible acute infarction, mass lesion, hemorrhage,  hydrocephalus or extra-axial collection. Vascular: There is atherosclerotic calcification of the major vessels at the base of the brain. Skull: Negative Sinuses/Orbits: Clear/normal Other: None ASPECTS ([place] Stroke Program Early CT Score) - Ganglionic level infarction (caudate, lentiform nuclei, internal capsule, insula, M1-M3 cortex): 7 - Supraganglionic infarction (M4-M6 cortex): 3 Total score (0-10 with 10 being normal): 10 IMPRESSION: 1. No acute CT finding. Chronic small-vessel ischemic changes throughout the brain. Physiologic calcification of basal ganglia. 2. ASPECTS is 10 3. Discussed with Dr. Cheral Marker during interpretation at 12 17 hours. Electronically Signed   By: Nelson Chimes M.D.   On: 07/26/2020 12:22   CT ANGIO HEAD CODE STROKE  Result Date: 07/26/2020 CLINICAL DATA:  Acute presentation with left body weakness and speech disturbance. EXAM: CT ANGIOGRAPHY HEAD AND NECK CT PERFUSION BRAIN TECHNIQUE: Multidetector CT imaging of the head and neck was performed using the standard protocol during bolus administration of intravenous contrast. Multiplanar CT image reconstructions and MIPs were obtained to evaluate the vascular anatomy. Carotid stenosis measurements (when applicable) are obtained utilizing NASCET criteria, using the distal internal carotid diameter as the denominator. Multiphase CT imaging of the brain was performed following IV bolus contrast injection. Subsequent parametric perfusion maps were calculated using RAPID software. CONTRAST:  Not annotated COMPARISON:  Head CT earlier same day FINDINGS: CTA NECK FINDINGS Aortic arch: No aortic atherosclerotic calcification seen. No aneurysm or dissection. Branching pattern is normal with wide patency of the origins. Right carotid system: Common carotid artery widely patent to the bifurcation. No atherosclerotic disease at the carotid bifurcation. ICA widely patent. Left carotid system: Common carotid artery widely patent to the bifurcation.  Carotid bifurcation is normal without soft or calcified plaque. ICA widely patent. Vertebral arteries: Both vertebral artery origins are widely patent. Both vertebral arteries are normal through the cervical region to the foramen magnum. Skeleton:  Ordinary cervical spondylosis. Other neck: No mass or lymphadenopathy. Upper chest: No active chest disease. 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 Review of the MIP images confirms the above findings CTA HEAD FINDINGS Anterior circulation: Complete occlusion of the right carotid terminus. Minimal flow visible in the right MCA territory. Right ACA receives it supply through the anterior communicating route. No left stenosis or occlusion. Right PCA arises from the carotid artery immediately proximal to the occlusion. Posterior circulation: Both vertebral arteries patent to the basilar. Atherosclerotic narrowing the distal vertebral arteries and of the basilar. Posterior circulation branch vessels are patent. Right PCA receives most of it supply from the anterior circulation. Venous sinuses: Patent and normal. Anatomic variants: None significant. 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 Minimal flow seen in the right MCA territory. Right ACA territory appears normally perfused, probably due to a patent anterior communicating artery. 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 Large penumbra, 136 cc in the remainder of the right MCA territory. No aortic atherosclerotic disease seen. No atherosclerotic disease at either carotid bifurcation. Some atherosclerotic narrowing of the distal vertebral arteries and basilar artery. Preliminary report sent by text at 1238  hours. Discussed with stroke PA 1242 hours. Electronically Signed   By: MarNelson ChimesD.   On: 07/26/2020 12:48   CT ANGIO NECK CODE STROKE  Result Date: 07/26/2020 CLINICAL DATA:  Acute presentation with left body weakness and speech disturbance. EXAM: CT ANGIOGRAPHY HEAD AND NECK CT PERFUSION BRAIN TECHNIQUE: Multidetector CT imaging of the head and neck was performed using the standard protocol during bolus administration of intravenous contrast. Multiplanar CT image reconstructions and MIPs were obtained to evaluate the vascular anatomy. Carotid stenosis measurements (when applicable) are obtained utilizing NASCET criteria, using the distal internal carotid diameter as the denominator. Multiphase CT imaging of the brain was performed following IV bolus contrast injection. Subsequent parametric perfusion maps were calculated using RAPID software. CONTRAST:  Not annotated COMPARISON:  Head CT earlier same day FINDINGS: CTA NECK FINDINGS Aortic arch: No aortic atherosclerotic calcification seen. No aneurysm or dissection. Branching pattern is normal with wide patency of the origins. Right carotid system: Common carotid artery widely patent to the bifurcation. No atherosclerotic disease at the carotid bifurcation. ICA widely patent. Left carotid system: Common carotid artery widely patent to the bifurcation. Carotid bifurcation is normal without soft or calcified plaque. ICA widely patent. Vertebral arteries: Both vertebral artery origins are widely patent. Both vertebral arteries are normal through the cervical region to the foramen magnum. Skeleton: Ordinary cervical spondylosis. Other neck: No mass or lymphadenopathy. Upper chest: No active chest disease. 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 Review of the MIP images confirms the above findings CTA HEAD FINDINGS Anterior circulation: Complete occlusion of the right carotid terminus.  Minimal flow visible in the right MCA territory. Right ACA receives it supply through the anterior communicating route. No left stenosis or occlusion. Right PCA arises from the carotid artery immediately proximal to the occlusion. Posterior circulation: Both vertebral arteries patent to the basilar. Atherosclerotic narrowing the distal vertebral arteries and of the basilar. Posterior circulation branch vessels are patent. Right PCA receives most  of it supply from the anterior circulation. Venous sinuses: Patent and normal. Anatomic variants: None significant. 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 Minimal flow seen in the right MCA territory. Right ACA territory appears normally perfused, probably due to a patent anterior communicating artery. 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 Large penumbra, 136 cc in the remainder of the right MCA territory. No aortic atherosclerotic disease seen. No atherosclerotic disease at either carotid bifurcation. Some atherosclerotic narrowing of the distal vertebral arteries and basilar artery. Preliminary report sent by text at 1238 hours. Discussed with stroke PA 1242 hours. Electronically Signed   By: MarNelson ChimesD.   On: 07/26/2020 12:48   IR ANGIO VERTEBRAL SEL SUBCLAVIAN INNOMINATE UNI R MOD SED  Result Date: 07/29/2020 INDICATION: Acute onset of left-sided hemiplegia, right gaze deviation and slurred speech.  Occluded right internal carotid artery supraclinoid segment, right middle cerebral artery and the right anterior cerebral artery.  EXAM: 1. EMERGENT LARGE VESSEL OCCLUSION THROMBOLYSIS (anterior CIRCULATION)  COMPARISON:  CT angiogram of the head and neck of July 26, 2020.  MEDICATIONS: Ancef 2 g IV antibiotic was  administered within 1 hour of the procedure.  ANESTHESIA/SEDATION: General anesthesia  CONTRAST:  Isovue 300 approximately 100 mL  FLUOROSCOPY TIME:  Fluoroscopy Time: 38 minutes 36 seconds (1410 mGy).  COMPLICATIONS: None immediate.  TECHNIQUE: Following a full explanation of the procedure along with the potential associated complications, an informed witnessed consent was obtained the patient's spouse via an interpreter. The risks of intracranial hemorrhage of 10%, worsening neurological deficit, ventilator dependency, death and inability to revascularize were all reviewed in detail with the patient's spouse.  The patient was then put under general anesthesia [facility] and draped in the usual sterile fashion. Thereafter using modified Seldinger technique, transfemoral access into the right common femoral artery was obtained without difficulty. Over a 0.035 inch guidewire a 5 French Pinnacle sheath was inserted. Through this, and also over a 0.035 inch guidewire a 8 French 25 cm catheter was advanced to the aortic arch region and selectively positioned in the innominate artery and right common carotid artery.  FINDINGS: Innominate arteriogram demonstrates the origin of the right subclavian artery and the right common carotid artery to be widely patent.  Moderate tortuosity is seen of the right common carotid artery proximally.  The right vertebral artery origin is widely patent.  The vessel is seen to opacify to the cranial skull base. The opacified portion of the basilar artery on the AP projection demonstrates patency with suggestion of a 50% stenosis of the right vertebrobasilar junction.  The right common carotid arteriogram demonstrates the right external carotid artery and its major branches to be widely patent.  The right internal carotid artery at the bulb to the cranial skull base is patent with moderate severe  tortuosity involving the mid right M1 segment associated with a 50% stenosis.  The petrous and proximal cavernous segments are widely patent.  There is complete angiographic occlusion of the right internal carotid artery just distal to the origin of the right posterior communicating artery supplying the right posterior cerebral artery distribution.  PROCEDURE: The diagnostic JB 1 catheter in the right common carotid artery was exchanged over a 0.035 inch 300 cm RosConstance Holster  exchange guidewire for an 087 95 cm balloon guide catheter which had been prepped with 50% contrast and 50% heparinized saline infusion.  Guidewire was removed. Good aspiration obtained from the hub of the balloon guide catheter. A gentle contrast injection demonstrated no evidence of vasospasm, or of intraluminal filling defects.  Over a 0.014 inch standard Synchro micro guidewire, an 027 160 cm Marksman microcatheter inside of an 071 135 cm Zoom aspiration catheter was advanced without difficulty to the distal cavernous segment of the right internal carotid artery.  The micro guidewire was then gently manipulated without difficulty and advanced into the inferior division of the right middle cerebral artery M2 M3 region followed by the microcatheter. The guidewire was removed. Mild resistance was again to aspiration which cleared with more proximal position of the microcatheter. A gentle control arteriogram performed through the microcatheter demonstrated safe position of tip of the microcatheter, which was connected to continuous heparinized saline infusion.  A 3 mm x 20 mm Solitaire X retrieval device was then advanced to the distal end of the microcatheter. The O ring on the delivery microcatheter was loosened. With slight forward gentle traction with the right hand on the delivery micro guidewire with the left hand the delivery microcatheter was retrieved unsheathing the retrieval device.  The 071 aspiration catheter was engaged in the  occluded right middle cerebral artery M1 segment. Continued aspiration was applied using an aspiration device with proximal flow arrest in the right internal carotid artery, and aspiration using a 60 mL syringe at the hub of the balloon guide catheter for approximately 2-1/2 minutes.  Thereafter, the combination of the retrieval device, the microcatheter, aspiration catheter was retrieved and removed as aspiration was applied at the hub of the balloon guide catheter. Large clot and small pieces were seen from within the aspiration catheter.  A control arteriogram performed through the balloon guide following reversal of flow arrest in the right internal carotid artery demonstrated wide patency of the superior and inferior divisions of the right middle cerebral artery. A control arteriogram performed following infusion of 25 mcg of nitroglycerin intra-arterially now demonstrated significantly improved caliber and flow through the superior and inferior divisions. There continued be slow flow involving the M3 M4 branches of the anterior of peri insular branches consistent with the site of the core seen on the CT perfusion studies.  A TICI 2C revascularization of the right MCA distribution had been achieved. The right anterior cerebral artery was now also widely patent with a TICI 3 revascularization.  Patency of the right posterior communicating artery and the right posterior cerebral artery territory was intact.  Control arteriogram performed through the balloon guide in the right common carotid artery demonstrated free flow in the right internal carotid artery extra cranially and intracranially.  This was then removed. The 8 French Pinnacle sheath in the right groin was replaced with an 8 French Angio-Seal closure device for hemostasis. The distal pulses remained Dopplerable in both the DPs and posterior tibial arteries bilaterally unchanged.  Post procedure CT scan of the brain demonstrated contrast blush in  the right basal ganglia region. No gross mass effect or midline shift was noted.  The patient was left intubated on account of the patient's neurologic condition prior to intubation. She was then transported to the neuro ICU for post thrombectomy care.  IMPRESSION: Status post endovascular revascularization of occluded right internal carotid artery supraclinoid segment, the right middle cerebral and the right anterior cerebral artery with 1 pass with a 3  mm x 20 mm Solitaire X retrieval device, contact aspiration and proximal flow arrest achieving a TICI 2C revascularization.  PLAN: Follow-up as per referring MD.   Electronically Signed   By: Luanne Bras M.D.   On: 07/27/2020 14:02   Labs:  CBC: Recent Labs    07/03/20 0344 07/03/20 0344 07/26/20 1211 07/26/20 1215 07/27/20 0637 07/28/20 0452  WBC 13.3*  --  6.5  --  11.5* 13.3*  HGB 10.1*   < > 10.7* 12.9 10.2* 10.2*  HCT 33.4*   < > 37.7 38.0 35.9* 34.8*  PLT 313  --  185  --  235 209   < > = values in this interval not displayed.    COAGS: Recent Labs    06/29/20 0318 07/26/20 1211  INR 1.4* 1.1  APTT 35 24    BMP: Recent Labs    07/03/20 0344 07/03/20 0344 07/26/20 1211 07/26/20 1215 07/27/20 0637 07/28/20 0452  NA 134*   < > 137 141 138 136  K 3.6   < > 4.6 4.5 4.6 4.1  CL 108   < > 106 106 108 105  CO2 18*  --  19*  --  22 19*  GLUCOSE 335*   < > 203* 198* 110* 180*  BUN 15   < > 22 25* 19 17  CALCIUM 7.7*  --  9.2  --  8.6* 8.2*  CREATININE 0.98   < > 1.17* 1.10* 0.94 0.95  GFRNONAA 58*  --  47*  --  >60 60*  GFRAA >60  --  54*  --  >60 >60   < > = values in this interval not displayed.    LIVER FUNCTION TESTS: Recent Labs    07/01/20 0556 07/02/20 1825 07/03/20 0344 07/26/20 1211  BILITOT 0.5 0.2* 0.6 1.4*  AST 43* _0 ALT _1 ALKPHOS 112 109 93 90  PROT 5.3* 5.2* 5.0* 6.8  ALBUMIN 1.9* 1.8* 1.8* 3.7    Assessment and Plan:  Acute CVA s/p cerebral arteriogram with  emergent mechanical thrombectomy of right ICA terminus occlusion achieving a TICI 2c/3 revascularization via right femoral approach 07/26/2020 by Dr. Estanislado Pandy. Patient's condition stable- remains intubated without sedated, opens eyes to voice/gentle touch and follows simple commands, moving right side and left side withdraws from pain. Right groin puncture site stable, distal pulses (DPs) 1+ bilaterally. For possible extubation today per RN. Further plans per neurology/CCM- appreciate and agree with management. NIR to follow.   Electronically Signed: Earley Abide, PA-C 07/29/2020, 9:02 AM   I spent a total of 25 Minutes at the the patient's bedside AND on the patient's hospital floor or unit, greater than 50% of which was counseling/coordinating care for CVA s/p revascularization.

## 2020-07-29 NOTE — Progress Notes (Signed)
STROKE TEAM PROGRESS NOTE   INTERVAL HISTORY Her husband and her son remain at the bedside.  Her neurological status is unchanged and patient remains intubated for respiratory failure but is arousable and follows simple commands on the right side after translation by her husband.  She is weaning on the ventilator and CCM MD plans to discuss DNR status in 1 with extubation plans prior to extubation Vitals:   07/29/20 1123 07/29/20 1200 07/29/20 1300 07/29/20 1400  BP:  121/76 135/78 119/78  Pulse: 85 (!) 51 77 73  Resp: 16 17 19 18   Temp: 97.9 F (36.6 C)     TempSrc:      SpO2: 100% 100% 100% 100%  Weight:      Height:       CBC:  Recent Labs  Lab 07/26/20 1211 07/26/20 1215 07/28/20 0452 07/29/20 0830  WBC 6.5   < > 13.3* 17.8*  NEUTROABS 4.1  --   --   --   HGB 10.7*   < > 10.2* 10.1*  HCT 37.7   < > 34.8* 35.2*  MCV 75.1*   < > 74.2* 73.6*  PLT 185   < > 209 188   < > = values in this interval not displayed.   Basic Metabolic Panel:  Recent Labs  Lab 07/28/20 0452 07/28/20 0452 07/28/20 1621 07/29/20 0419 07/29/20 0830  NA 136  --   --   --  135  K 4.1  --   --   --  4.1  CL 105  --   --   --  104  CO2 19*  --   --   --  21*  GLUCOSE 180*  --   --   --  196*  BUN 17  --   --   --  22  CREATININE 0.95  --   --   --  0.92  CALCIUM 8.2*  --   --   --  8.2*  MG 2.5*   < > 2.1 2.0  --   PHOS 3.9   < > 3.4 3.1  --    < > = values in this interval not displayed.   Lipid Panel:  Recent Labs  Lab 07/27/20 0637  CHOL 147  TRIG 79  HDL 41  CHOLHDL 3.6  VLDL 16  LDLCALC 90   Lab Results  Component Value Date   HGBA1C 8.7 (H) 07/27/2020   Urine Drug Screen:  Recent Labs  Lab 07/27/20 1254  LABOPIA NONE DETECTED  COCAINSCRNUR NONE DETECTED  LABBENZ NONE DETECTED  AMPHETMU NONE DETECTED  THCU NONE DETECTED  LABBARB NONE DETECTED    Alcohol Level  Recent Labs  Lab 07/26/20 1209  ETH <10    IMAGING past 24 hours DG Abd Portable 1V  Result Date:  07/29/2020 CLINICAL DATA:  Feeding into placement EXAM: PORTABLE ABDOMEN - 1 VIEW COMPARISON:  July 26, 2020 FINDINGS: Nasogastric tube removed. Feeding tube present with tip in the stomach. Visualized bowel unremarkable. No free air evident. Visualized lungs clear. Endotracheal tube tip is 4.0 cm above the carina. IMPRESSION: Feeding tube tip in stomach. Visualized bowel gas pattern unremarkable. Visualized lungs clear. Electronically Signed   By: July 28, 2020 III M.D.   On: 07/29/2020 12:02   IR PERCUTANEOUS ART THROMBECTOMY/INFUSION INTRACRANIAL INC DIAG ANGIO  Result Date: 07/28/2020 INDICATION: Acute onset of left-sided hemiplegia, right gaze deviation and slurred speech. Occluded right internal carotid artery supraclinoid segment, right middle cerebral artery and  the right anterior cerebral artery. EXAM: 1. EMERGENT LARGE VESSEL OCCLUSION THROMBOLYSIS (anterior CIRCULATION) COMPARISON:  CT angiogram of the head and neck of July 26, 2020. MEDICATIONS: Ancef 2 g IV antibiotic was administered within 1 hour of the procedure. ANESTHESIA/SEDATION: General anesthesia CONTRAST:  Isovue 300 approximately 100 mL FLUOROSCOPY TIME:  Fluoroscopy Time: 38 minutes 36 seconds (1410 mGy). COMPLICATIONS: None immediate. TECHNIQUE: Following a full explanation of the procedure along with the potential associated complications, an informed witnessed consent was obtained the patient's spouse via an interpreter. The risks of intracranial hemorrhage of 10%, worsening neurological deficit, ventilator dependency, death and inability to revascularize were all reviewed in detail with the patient's spouse. The patient was then put under general anesthesia by the Department of Anesthesiology at [facility]. The right groin was prepped and draped in the usual sterile fashion. Thereafter using modified Seldinger technique, transfemoral access into the right common femoral artery was obtained without difficulty. Over a  0.035 inch guidewire a 5 French Pinnacle sheath was inserted. Through this, and also over a 0.035 inch guidewire a 8 French 25 cm catheter was advanced to the aortic arch region and selectively positioned in the innominate artery and right common carotid artery. FINDINGS: Innominate arteriogram demonstrates the origin of the right subclavian artery and the right common carotid artery to be widely patent. Moderate tortuosity is seen of the right common carotid artery proximally. The right vertebral artery origin is widely patent. The vessel is seen to opacify to the cranial skull base. The opacified portion of the basilar artery on the AP projection demonstrates patency with suggestion of a 50% stenosis of the right vertebrobasilar junction. The right common carotid arteriogram demonstrates the right external carotid artery and its major branches to be widely patent. The right internal carotid artery at the bulb to the cranial skull base is patent with moderate severe tortuosity involving the mid right M1 segment associated with a 50% stenosis. The petrous and proximal cavernous segments are widely patent. There is complete angiographic occlusion of the right internal carotid artery just distal to the origin of the right posterior communicating artery supplying the right posterior cerebral artery distribution. PROCEDURE: The diagnostic JB 1 catheter in the right common carotid artery was exchanged over a 0.035 inch 300 cm Rosen exchange guidewire for an 087 95 cm balloon guide catheter which had been prepped with 50% contrast and 50% heparinized saline infusion. Guidewire was removed. Good aspiration obtained from the hub of the balloon guide catheter. A gentle contrast injection demonstrated no evidence of vasospasm, or of intraluminal filling defects. Over a 0.014 inch standard Synchro micro guidewire, an 027 160 cm Marksman microcatheter inside of an 071 135 cm Zoom aspiration catheter was advanced without  difficulty to the distal cavernous segment of the right internal carotid artery. The micro guidewire was then gently manipulated without difficulty and advanced into the inferior division of the right middle cerebral artery M2 M3 region followed by the microcatheter. The guidewire was removed. Mild resistance was again to aspiration which cleared with more proximal position of the microcatheter. A gentle control arteriogram performed through the microcatheter demonstrated safe position of tip of the microcatheter, which was connected to continuous heparinized saline infusion. A 3 mm x 20 mm Solitaire X retrieval device was then advanced to the distal end of the microcatheter. The O ring on the delivery microcatheter was loosened. With slight forward gentle traction with the right hand on the delivery micro guidewire with the left hand  the delivery microcatheter was retrieved unsheathing the retrieval device. The 071 aspiration catheter was engaged in the occluded right middle cerebral artery M1 segment. Continued aspiration was applied using an aspiration device with proximal flow arrest in the right internal carotid artery, and aspiration using a 60 mL syringe at the hub of the balloon guide catheter for approximately 2-1/2 minutes. Thereafter, the combination of the retrieval device, the microcatheter, aspiration catheter was retrieved and removed as aspiration was applied at the hub of the balloon guide catheter. Large clot and small pieces were seen from within the aspiration catheter. A control arteriogram performed through the balloon guide following reversal of flow arrest in the right internal carotid artery demonstrated wide patency of the superior and inferior divisions of the right middle cerebral artery. A control arteriogram performed following infusion of 25 mcg of nitroglycerin intra-arterially now demonstrated significantly improved caliber and flow through the superior and inferior divisions. There  continued be slow flow involving the M3 M4 branches of the anterior of peri insular branches consistent with the site of the core seen on the CT perfusion studies. A TICI 2C revascularization of the right MCA distribution had been achieved. The right anterior cerebral artery was now also widely patent with a TICI 3 revascularization. Patency of the right posterior communicating artery and the right posterior cerebral artery territory was intact. Control arteriogram performed through the balloon guide in the right common carotid artery demonstrated free flow in the right internal carotid artery extra cranially and intracranially. This was then removed. The 8 French Pinnacle sheath in the right groin was replaced with an 8 French Angio-Seal closure device for hemostasis. The distal pulses remained Dopplerable in both the DPs and posterior tibial arteries bilaterally unchanged. Post procedure CT scan of the brain demonstrated contrast blush in the right basal ganglia region. No gross mass effect or midline shift was noted. The patient was left intubated on account of the patient's neurologic condition prior to intubation. She was then transported to the neuro ICU for post thrombectomy care. IMPRESSION: Status post endovascular revascularization of occluded right internal carotid artery supraclinoid segment, the right middle cerebral and the right anterior cerebral artery with 1 pass with a 3 mm x 20 mm Solitaire X retrieval device, contact aspiration and proximal flow arrest achieving a TICI 2C revascularization. PLAN: Follow-up as per referring MD. Electronically Signed   By: Julieanne Cotton M.D.   On: 07/27/2020 14:02   IR ANGIO VERTEBRAL SEL SUBCLAVIAN INNOMINATE UNI R MOD SED  Result Date: 07/29/2020 INDICATION: Acute onset of left-sided hemiplegia, right gaze deviation and slurred speech.  Occluded right internal carotid artery supraclinoid segment, right middle cerebral artery and the right anterior  cerebral artery.  EXAM: 1. EMERGENT LARGE VESSEL OCCLUSION THROMBOLYSIS (anterior CIRCULATION)  COMPARISON:  CT angiogram of the head and neck of July 26, 2020.  MEDICATIONS: Ancef 2 g IV antibiotic was administered within 1 hour of the procedure.  ANESTHESIA/SEDATION: General anesthesia  CONTRAST:  Isovue 300 approximately 100 mL  FLUOROSCOPY TIME:  Fluoroscopy Time: 38 minutes 36 seconds (1410 mGy).  COMPLICATIONS: None immediate.  TECHNIQUE: Following a full explanation of the procedure along with the potential associated complications, an informed witnessed consent was obtained the patient's spouse via an interpreter. The risks of intracranial hemorrhage of 10%, worsening neurological deficit, ventilator dependency, death and inability to revascularize were all reviewed in detail with the patient's spouse.  The patient was then put under general anesthesia by the Department of Anesthesiology at  [facility].  The right groin was prepped and draped in the usual sterile fashion. Thereafter using modified Seldinger technique, transfemoral access into the right common femoral artery was obtained without difficulty. Over a 0.035 inch guidewire a 5 French Pinnacle sheath was inserted. Through this, and also over a 0.035 inch guidewire a 8 French 25 cm catheter was advanced to the aortic arch region and selectively positioned in the innominate artery and right common carotid artery.  FINDINGS: Innominate arteriogram demonstrates the origin of the right subclavian artery and the right common carotid artery to be widely patent.  Moderate tortuosity is seen of the right common carotid artery proximally.  The right vertebral artery origin is widely patent.  The vessel is seen to opacify to the cranial skull base. The opacified portion of the basilar artery on the AP projection demonstrates patency with suggestion of a 50% stenosis of the right vertebrobasilar junction.  The right common carotid  arteriogram demonstrates the right external carotid artery and its major branches to be widely patent.  The right internal carotid artery at the bulb to the cranial skull base is patent with moderate severe tortuosity involving the mid right M1 segment associated with a 50% stenosis.  The petrous and proximal cavernous segments are widely patent.  There is complete angiographic occlusion of the right internal carotid artery just distal to the origin of the right posterior communicating artery supplying the right posterior cerebral artery distribution.  PROCEDURE: The diagnostic JB 1 catheter in the right common carotid artery was exchanged over a 0.035 inch 300 cm Rosen exchange guidewire for an 087 95 cm balloon guide catheter which had been prepped with 50% contrast and 50% heparinized saline infusion.  Guidewire was removed. Good aspiration obtained from the hub of the balloon guide catheter. A gentle contrast injection demonstrated no evidence of vasospasm, or of intraluminal filling defects.  Over a 0.014 inch standard Synchro micro guidewire, an 027 160 cm Marksman microcatheter inside of an 071 135 cm Zoom aspiration catheter was advanced without difficulty to the distal cavernous segment of the right internal carotid artery.  The micro guidewire was then gently manipulated without difficulty and advanced into the inferior division of the right middle cerebral artery M2 M3 region followed by the microcatheter. The guidewire was removed. Mild resistance was again to aspiration which cleared with more proximal position of the microcatheter. A gentle control arteriogram performed through the microcatheter demonstrated safe position of tip of the microcatheter, which was connected to continuous heparinized saline infusion.  A 3 mm x 20 mm Solitaire X retrieval device was then advanced to the distal end of the microcatheter. The O ring on the delivery microcatheter was loosened. With slight forward gentle  traction with the right hand on the delivery micro guidewire with the left hand the delivery microcatheter was retrieved unsheathing the retrieval device.  The 071 aspiration catheter was engaged in the occluded right middle cerebral artery M1 segment. Continued aspiration was applied using an aspiration device with proximal flow arrest in the right internal carotid artery, and aspiration using a 60 mL syringe at the hub of the balloon guide catheter for approximately 2-1/2 minutes.  Thereafter, the combination of the retrieval device, the microcatheter, aspiration catheter was retrieved and removed as aspiration was applied at the hub of the balloon guide catheter. Large clot and small pieces were seen from within the aspiration catheter.  A control arteriogram performed through the balloon guide following reversal of flow arrest in  the right internal carotid artery demonstrated wide patency of the superior and inferior divisions of the right middle cerebral artery. A control arteriogram performed following infusion of 25 mcg of nitroglycerin intra-arterially now demonstrated significantly improved caliber and flow through the superior and inferior divisions. There continued be slow flow involving the M3 M4 branches of the anterior of peri insular branches consistent with the site of the core seen on the CT perfusion studies.  A TICI 2C revascularization of the right MCA distribution had been achieved. The right anterior cerebral artery was now also widely patent with a TICI 3 revascularization.  Patency of the right posterior communicating artery and the right posterior cerebral artery territory was intact.  Control arteriogram performed through the balloon guide in the right common carotid artery demonstrated free flow in the right internal carotid artery extra cranially and intracranially.  This was then removed. The 8 French Pinnacle sheath in the right groin was replaced with an 8 French Angio-Seal  closure device for hemostasis. The distal pulses remained Dopplerable in both the DPs and posterior tibial arteries bilaterally unchanged.  Post procedure CT scan of the brain demonstrated contrast blush in the right basal ganglia region. No gross mass effect or midline shift was noted.  The patient was left intubated on account of the patient's neurologic condition prior to intubation. She was then transported to the neuro ICU for post thrombectomy care.  IMPRESSION: Status post endovascular revascularization of occluded right internal carotid artery supraclinoid segment, the right middle cerebral and the right anterior cerebral artery with 1 pass with a 3 mm x 20 mm Solitaire X retrieval device, contact aspiration and proximal flow arrest achieving a TICI 2C revascularization.  PLAN: Follow-up as per referring MD.   Electronically Signed   By: Julieanne CottonSanjeev  Deveshwar M.D.   On: 07/27/2020 14:02   PHYSICAL EXAM Elderly Asian lady who is intubated sedated. Not in distress. . Afebrile. Head is nontraumatic. Neck is supple without bruit.    Cardiac exam no murmur or gallop. Lungs are clear to auscultation. Distal pulses are well felt. Neurological Exam :  Patient is intubated sedated. Eyes closed but opens with sternal rub.  She follows some simple midline and commands on the right side after translation by her husband. Right gaze preference. Unable to look to the left. Blinks to threat on the right but not on the left. Pupils were irregular but reactive. Fundi not visualized. Left lower facial weakness. Tongue midline. Purposeful antigravity movements on the right. Left hemiplegia but will withdraw the left lower extremity to pain but not the left upper extremity. Tone is reduced on the left compared to the right. Left plantar upgoing right downgoing. Gait not tested. ASSESSMENT/PLAN Ms. Naveen Marga HootsSiu is a 74 y.o. female with history of atrial fibrillation, HTN, DM and stroke presenting with right gaze deviation,  left-sided hemiplegia and dense receptive and expressive aphasia. She received tPA 07/26/2020 at 1259. Taken to IR for R ICA T occlusion.  Stroke:   R MCA infarct w/ petechial hemorrhage s/p tPA and IR revascularization, embolic secondary to known AF source on Eliquis  Code Stroke CT head No acute abnormality. Small vessel disease. Atrophy. ASPECTS 10.     CTA head & neck R ICA occlusion w/ min MCA flow. Some narrowing B VA and BA.   CT perfusion R frontal operculum core 16 cc, penumbra 136 R MCA territory  Cerebral angio RT ICA T occlusion with x 1 pass solitaire ->TICI 2C revascularization of RT  MCA and a TICI 3 revascularization of RT ACA .  Post IR CT focal contrast stain in the RT globus pallidus. No gross mass effect or shift.   Post IR CT R insular infarct. R caudate head and lentiform nucleus hyperdensity. No hemorrhage.   CT new R frontal lobe hyper and hypointensity (infarct, edema, petechial hemorrhage)  MRI  pending   2D Echo 07/03/2020 EF 65-70%. No source of embolus   LDL 90  HgbA1c 12.7  VTE prophylaxis - SCDs   Eliquis (apixaban) daily prior to admission, now on aspirin 81 mg. Hold antithrombotic given petechial hemorrhage noted on imaging  Therapy recommendations: Pending  Disposition:  pending   Acute Respiratory Failure  Secondary to stroke  Intubated for IR, left intubated post IR   No sedation   Hold extubation until MRI completed  CCM on board  Atrial Fibrillation w/ RVR  Home anticoagulation:  Eliquis (apixaban) daily   on diltiazem 240, metoprolol 12.5 bid  at home  Now on cardizem gtt . No AC at this time given petechial hemorrhage   Hypotension  Home meds:  None, no hx HTN . BP goal per IR x 24h following IR procedure and tPA administration.  . Currently on Phenylephrine to meet goal 100-140 x 24h  . Long-term BP goal normotensive  Hyperlipidemia  Home meds:  crestor 20, resumed in hospital  LDL 90, goal < 70  Continue statin  at discharge  Diabetes type II Uncontrolled  Home meds:  lantus 20 daily, metformin 500 bid  HgbA1c 12.7, goal < 7.0  CBGs Recent Labs    07/29/20 0330 07/29/20 0743 07/29/20 1224  GLUCAP 124* 205* 168*      SSI  Dysphagia . Secondary to stroke . NPO . Speech on board   Other Stroke Risk Factors  Advanced age  Hx stroke/TIA  06/2018 left pontine infarct likely secondary to small vessel disease source. DAPT x 3 weeks.  Other Active Problems  Microcytic anemia 10.2   Leukocytosis 11.5  Hospital day # 3 Patient prognosis is guarded given significant infarcts on MRI and persistent right gaze preference and dense left hemiplegia.  Recommend trial of extubation when medically stable.  Start aspirin 81 mg daily for her atrial fibrillation and anticoagulation only after she is able to swallow or has a PEG tube.  I had a long discussion the patient's husband  and answered questions.  Family may need to decide about one-way extubation versus continuing aggressive care and tracheostomy and PEG tube if necessary.  Discussed with Dr. Toney Reil critical care medicine.  This patient is critically ill and at significant risk of neurological worsening, death and care requires constant monitoring of vital signs, hemodynamics,respiratory and cardiac monitoring, extensive review of multiple databases, frequent neurological assessment, discussion with family, other specialists and medical decision making of high complexity.I have made any additions or clarifications directly to the above note.This critical care time does not reflect procedure time, or teaching time or supervisory time of PA/NP/Med Resident etc but could involve care discussion time.  I spent 30 minutes of neurocritical care time  in the care of  this patient.  Delia Heady, MD  To contact Stroke Continuity provider, please refer to [email or web address]. After hours, contact General Neurology

## 2020-07-29 NOTE — Procedures (Addendum)
Cortrak  Tube Type:  Cortrak - 43 inches Tube Location:  Left nare Initial Placement:  Stomach Secured by: Bridle Technique Used to Measure Tube Placement:  Documented cm marking at nare/ corner of mouth Cortrak Secured At:  59 cm    Cortrak Tube Team Note:  Consult received to place a Cortrak feeding tube.   X-ray is required, abdominal x-ray has been ordered by the Cortrak team. Please confirm tube placement before using the Cortrak tube.   If the tube becomes dislodged please keep the tube and contact the Cortrak team at [email or web address] (password [email or web address]) for replacement.  If after hours and replacement cannot be delayed, place a NG tube and confirm placement with an abdominal x-ray.    Betsey Holiday MS, RD, LDN Please refer to [facility] for RD and/or RD on-call/weekend/after hours pager

## 2020-07-29 NOTE — Evaluation (Signed)
Occupational Therapy Evaluation Patient Details Name: Samantha Paul MRN: [id number] DOB: [birth year] Today's Date: 07/29/2020    History of Present Illness (P) 74 y.o. female with history of atrial fibrillation, HTN, DM and stroke, who presents with acute onset of right gaze deviation, left-sided hemiplegia and dense receptive and expressive aphasia. 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 Pt received tPA and underwent R ICA and MCA thrombectomy on 8/1. Follow-up MRI demonstrating moderate R MCA as well as R ACA and PCA infarcts with trace petechial hemorrhage.   Clinical Impression   Pt admitted with above. She demonstrates the below listed deficits and will benefit from continued OT to maximize safety and independence with BADLs.  Pt presents to OT with Lt hemiparesis, Questionable extensor spasticity, questionable Lt visual field deficit, impaired balance, impaired cognition.  She currently requires total A for all aspects of ADLs and functional mobility.  PTA, pt lived with spouse, but unable to clarify family support at this time, as family not available.  Will follow acutely.       Follow Up Recommendations  CIR    Equipment Recommendations  None recommended by OT    Recommendations for Other Services Rehab consult     Precautions / Restrictions Precautions Precautions: (P) Fall Precaution Comments: (P) ETT, multiple lines/tubes  Restrictions Weight Bearing Restrictions: (P) No      Mobility Bed Mobility Overal bed mobility: Needs Assistance Bed Mobility: Supine to Sit;Sit to Supine     Supine to sit: Total assist;+2 for physical assistance;+2 for safety/equipment Sit to supine: +2 for safety/equipment;+2 for physical assistance;Total assist   General bed mobility comments: requires assist for all aspects   Transfers                      Balance Overall balance assessment: Needs  assistance Sitting-balance support: Feet unsupported;Single extremity supported Sitting balance-Leahy Scale: Zero Sitting balance - Comments: Pt requires max A to maintain EOB sitting.  pt with questionable extensor spasticity vs. pushing posteriorly                                    ADL either performed or assessed with clinical judgement   ADL Overall ADL's : Needs assistance/impaired Eating/Feeding: NPO   Grooming: Wash/dry hands;Wash/dry face;Oral care;Brushing hair;Total assistance;Sitting   Upper Body Bathing: Total assistance;Sitting   Lower Body Bathing: Total assistance;Bed level   Upper Body Dressing : Total assistance;Bed level;Sitting   Lower Body Dressing: Total assistance;Bed level   Toilet Transfer: Total assistance Toilet Transfer Details (indicate cue type and reason): unable to attempt  Toileting- Clothing Manipulation and Hygiene: Total assistance;Bed level       Functional mobility during ADLs: Total assistance;+2 for safety/equipment;+2 for physical assistance       Vision   Additional Comments: Pt with questionable ptosis Lt eye.  She will open eyes with effort when cued.  She does not blink to threat on her Lt and does not turn to stimuli on her Lt.  Does not track past midline when cued      Perception Perception Comments: difficult to accurately assess due to attentional, and language deficits    Praxis Praxis Praxis tested?: Not tested    Pertinent Vitals/Pain Pain Assessment: No/denies pain     Hand Dominance Right   Extremity/Trunk Assessment Upper Extremity Assessment Upper  Extremity Assessment: LUE deficits/detail LUE Deficits / Details: Moderate edema noted.  Pt with no volitional movement noted  LUE Coordination: decreased fine motor;decreased gross motor   Lower Extremity Assessment Lower Extremity Assessment: Defer to PT evaluation   Cervical / Trunk Assessment Cervical / Trunk Assessment: Other  exceptions Cervical / Trunk Exceptions: Poor trunk and head/neck control in seated position    Communication Communication Communication: Prefers language other than English   Cognition Arousal/Alertness: Awake/alert;Lethargic Behavior During Therapy: Flat affect Overall Cognitive Status: Impaired/Different from baseline Area of Impairment: Attention;Following commands                   Current Attention Level: Focused   Following Commands: Follows one step commands inconsistently;Follows one step commands with increased time       General Comments: difficult to accurately assess due to language barrier.  She is oriented to self, that she is in hospital.  She follows one step motor commands ~40% of the time with a delay    General Comments  VSS     Exercises     Shoulder Instructions      Home Living Family/patient expects to be discharged to:: (P) Private residence Living Arrangements: Spouse/significant other;Children;Other relatives Available Help at Discharge: Family;Available 24 hours/day Type of Home: House Home Access: Stairs to enter [employer] of Steps: 4 Entrance Stairs-Rails: Can reach both Home Layout: One level     Bathroom Shower/Tub: Producer, television/film/video: Standard     Home Equipment: Cane - single point;Cane - quad;Wheelchair - manual;Bedside commode   Additional Comments: Pt unable to provide info re: PLOF due to ETT.  info gleaned from chart.  In person interpreter utilized       Prior Functioning/Environment          Comments: Pt unable to provide info         OT Problem List: Decreased strength;Decreased range of motion;Decreased activity tolerance;Impaired balance (sitting and/or standing);Impaired vision/perception;Decreased coordination;Decreased cognition;Decreased safety awareness;Decreased knowledge of use of DME or AE;Cardiopulmonary status limiting activity;Impaired sensation;Impaired tone;Impaired UE  functional use;Increased edema      OT Treatment/Interventions: Self-care/ADL training;Neuromuscular education;DME and/or AE instruction;Manual therapy;Splinting;Therapeutic activities;Cognitive remediation/compensation;Visual/perceptual remediation/compensation;Patient/family education;Balance training    OT Goals(Current goals can be found in the care plan section) Acute Rehab OT Goals OT Goal Formulation: Patient unable to participate in goal setting Time For Goal Achievement: 08/12/20 Potential to Achieve Goals: Good ADL Goals Pt Will Perform Grooming: (P) with mod assist;sitting Pt Will Perform Upper Body Bathing: (P) with mod assist;sitting Additional ADL Goal #1: (P) Pt will sustain attention to familiar self care activity x 3 mins with mod cues Additional ADL Goal #2: (P) Pt will locate item/person on Lt with min cues at least 50% of the time Additional ADL Goal #3: (P) Family will be independent with ROM and positioning Lt UE  OT Frequency: Min 2X/week   Barriers to D/C:            Co-evaluation PT/OT/SLP Co-Evaluation/Treatment: Yes Reason for Co-Treatment: (P) Complexity of the patient's impairments (multi-system involvement);Necessary to address cognition/behavior during functional activity;For patient/therapist safety PT goals addressed during session: (P) Mobility/safety with mobility;Balance;Strengthening/ROM OT goals addressed during session: Strengthening/ROM      AM-PAC OT "6 Clicks" Daily Activity     Outcome Measure Help from another person eating meals?: Total Help from another person taking care of personal grooming?: Total Help from another person toileting, which includes using toliet, bedpan, or urinal?: Total Help  from another person bathing (including washing, rinsing, drying)?: Total Help from another person to put on and taking off regular upper body clothing?: Total Help from another person to put on and taking off regular lower body clothing?:  Total 6 Click Score: 6   End of Session Equipment Utilized During Treatment: Oxygen Nurse Communication: Mobility status  Activity Tolerance: Treatment limited secondary to medical complications (Comment) Patient left: in bed;with call bell/phone within reach;with nursing/sitter in room;Other (comment) (interpreter present )  OT Visit Diagnosis: Unsteadiness on feet (R26.81);Cognitive communication deficit (R41.841);Hemiplegia and hemiparesis Symptoms and signs involving cognitive functions: Cerebral infarction Hemiplegia - Right/Left: Left Hemiplegia - dominant/non-dominant: Non-Dominant Hemiplegia - caused by: Cerebral infarction                Time: 1451-1514 OT Time Calculation (min): 23 min Charges:  OT General Charges $OT Visit: 1 Visit OT Evaluation $OT Eval High Complexity: 1 High  Eber Jones., OTR/L Acute Rehabilitation Services Pager [phone] Office [phone]   Jeani Hawking M 07/29/2020, 3:44 PM

## 2020-07-29 NOTE — Progress Notes (Signed)
Patient placed back on full support for night rest. °

## 2020-07-29 NOTE — Evaluation (Signed)
Physical Therapy Evaluation Patient Details Name: Samantha Paul MRN: [id number] DOB: 09/21/1946 Today's Date: 07/29/2020   History of Present Illness  74 y.o. female with history of atrial fibrillation, HTN, DM and stroke, who presents with acute onset of right gaze deviation, left-sided hemiplegia and dense receptive and expressive aphasia. 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 Pt received tPA and underwent R ICA and MCA thrombectomy on 8/1. Follow-up MRI demonstrating moderate R MCA as well as R ACA and PCA infarcts with trace petechial hemorrhage.  Clinical Impression  Pt presents to PT with deficits in functional mobility, gait, balance, power, endurance, vision, cognition. Pt is flaccid on left side with some questionable signs of increased extensor tone. Pt requires total A for all functional mobility at this time and demonstrates posterior pushing vs extensor tone when sitting at the edge of bed which greatly increases the pt's falls risk. Pt with limited communication at this time, remaining intubated, but she does appear to understand interpreter and follows commands intermittently. Pt will benefit from continued acute PT POC to improve mobility quality and to reduce falls risk. PT currently recommends CIR as the pt demonstrates the potential to make significant functional gains with continued high intensity inpatient PT services.    Follow Up Recommendations CIR    Equipment Recommendations  Wheelchair (measurements PT);Wheelchair cushion (measurements PT);Hospital bed (mechanical lift, if home today)    Recommendations for Other Services Rehab consult     Precautions / Restrictions Precautions Precautions: Fall Precaution Comments: ETT, multiple lines/tubes  Restrictions Weight Bearing Restrictions: No      Mobility  Bed Mobility Overal bed mobility: Needs Assistance Bed Mobility: Supine to Sit;Sit to Supine      Supine to sit: Total assist;+2 for physical assistance;HOB elevated Sit to supine: Total assist;+2 for physical assistance   General bed mobility comments: requires assist for all aspects   Transfers                    Ambulation/Gait                Stairs            Wheelchair Mobility    Modified Rankin (Stroke Patients Only) Modified Rankin (Stroke Patients Only) Pre-Morbid Rankin Score: No symptoms Modified Rankin: Severe disability     Balance Overall balance assessment: Needs assistance Sitting-balance support: Feet unsupported;Single extremity supported Sitting balance-Leahy Scale: Zero Sitting balance - Comments: pt pushing posteriorly, requiring max-totalA to maintain sitting balance Postural control: Posterior lean                                   Pertinent Vitals/Pain Pain Assessment: No/denies pain    Home Living Family/patient expects to be discharged to:: Private residence Living Arrangements: Spouse/significant other;Children;Other relatives Available Help at Discharge: Family;Available 24 hours/day Type of Home: House Home Access: Stairs to enter Entrance Stairs-Rails: Can reach both Entrance Stairs-Number of Steps: 4 Home Layout: One level Home Equipment: Cane - single point;Cane - quad;Wheelchair - manual;Bedside commode Additional Comments: Pt unable to provide info re: PLOF due to ETT.  info gleaned from chart.  In person interpreter utilized     Prior Function Level of Independence: Independent         Comments: Pt unable to provide info. Per July admission pt was independent  Hand Dominance   Dominant Hand: Right    Extremity/Trunk Assessment   Upper Extremity Assessment Upper Extremity Assessment: Defer to OT evaluation LUE Deficits / Details: Moderate edema noted.  Pt with no volitional movement noted  LUE Coordination: decreased fine motor;decreased gross motor    Lower Extremity  Assessment Lower Extremity Assessment: RLE deficits/detail;LLE deficits/detail RLE Deficits / Details: generalized weakness, grossly 4-/5 LLE Deficits / Details: flaccid    Cervical / Trunk Assessment Cervical / Trunk Assessment: Other exceptions Cervical / Trunk Exceptions: Poor trunk and head/neck control in seated position. Pushing posteriorly vs extensor tone  Communication   Communication: Prefers language other than English  Cognition Arousal/Alertness: Awake/alert;Lethargic Behavior During Therapy: Flat affect Overall Cognitive Status: Impaired/Different from baseline Area of Impairment: Attention;Following commands                   Current Attention Level: Focused   Following Commands: Follows one step commands inconsistently;Follows one step commands with increased time       General Comments: difficult to accurately assess due to language barrier and intubation.  She is oriented to self, that she is in hospital.  She follows one step motor commands intermittently with a delay      General Comments General comments (skin integrity, edema, etc.): in person interpreter utilized, VSS on ventilator. Mobility limited to pt biting down on ETT resulting in reduced ventilation, PT/OT assist pt in return to supine where pt becomes more calm and respiratory status subsequently improves. Pt without blink to threat on left, does not track past midline to left side    Exercises     Assessment/Plan    PT Assessment Patient needs continued PT services  PT Problem List Decreased strength;Decreased activity tolerance;Decreased balance;Decreased mobility;Decreased cognition;Decreased knowledge of use of DME;Decreased coordination;Decreased safety awareness;Decreased knowledge of precautions;Impaired tone;Pain       PT Treatment Interventions DME instruction;Gait training;Stair training;Functional mobility training;Therapeutic activities;Balance training;Therapeutic  exercise;Neuromuscular re-education;Cognitive remediation;Patient/family education;Wheelchair mobility training    PT Goals (Current goals can be found in the Care Plan section)  Acute Rehab PT Goals Patient Stated Goal: To improve mobility quality and reduce caregiver burden. -PT goal, pt unable to verbalize or write this session PT Goal Formulation: Patient unable to participate in goal setting Time For Goal Achievement: 08/12/20 Potential to Achieve Goals: Good    Frequency Min 4X/week   Barriers to discharge        Co-evaluation PT/OT/SLP Co-Evaluation/Treatment: Yes Reason for Co-Treatment: Complexity of the patient's impairments (multi-system involvement);Necessary to address cognition/behavior during functional activity;For patient/therapist safety PT goals addressed during session: Mobility/safety with mobility;Balance;Strengthening/ROM OT goals addressed during session: Strengthening/ROM       AM-PAC PT "6 Clicks" Mobility  Outcome Measure Help needed turning from your back to your side while in a flat bed without using bedrails?: Total Help needed moving from lying on your back to sitting on the side of a flat bed without using bedrails?: Total Help needed moving to and from a bed to a chair (including a wheelchair)?: Total Help needed standing up from a chair using your arms (e.g., wheelchair or bedside chair)?: Total Help needed to walk in hospital room?: Total Help needed climbing 3-5 steps with a railing? : Total 6 Click Score: 6    End of Session Equipment Utilized During Treatment: Oxygen Activity Tolerance: Treatment limited secondary to medical complications (Comment) Patient left: in bed;with call bell/phone within reach;with bed alarm set Nurse Communication: Mobility status PT Visit Diagnosis: Unsteadiness on  feet (R26.81);Other abnormalities of gait and mobility (R26.89);Muscle weakness (generalized) (M62.81);Other symptoms and signs involving the nervous  system (R29.898)    Time: 3295-1884 PT Time Calculation (min) (ACUTE ONLY): 25 min   Charges:   PT Evaluation $PT Eval High Complexity: 1 High          Arlyss Gandy, PT, DPT Acute Rehabilitation Pager: [phone]   Arlyss Gandy 07/29/2020, 3:50 PM

## 2020-07-29 NOTE — Progress Notes (Signed)
Inpatient Rehab Admissions Coordinator Note:   Per PT recommendations, pt was screened for CIR candidacy by Estill Dooms, PT, DPT.  At this time pt still requiring mechanical ventilation.  Will follow for progress once extubated.  Please contact me with questions.   Estill Dooms, PT, DPT [phone] 07/29/20 4:56 PM

## 2020-07-30 DIAGNOSIS — I6300 Cerebral infarction due to thrombosis of unspecified precerebral artery: Secondary | ICD-10-CM

## 2020-07-30 DIAGNOSIS — I6601 Occlusion and stenosis of right middle cerebral artery: Secondary | ICD-10-CM | POA: Diagnosis not present

## 2020-07-30 DIAGNOSIS — J9601 Acute respiratory failure with hypoxia: Secondary | ICD-10-CM

## 2020-07-30 LAB — BASIC METABOLIC PANEL
Anion gap: 9 (ref 5–15)
BUN: 19 mg/dL (ref 8–23)
CO2: 21 mmol/L — ABNORMAL LOW (ref 22–32)
Calcium: 8.1 mg/dL — ABNORMAL LOW (ref 8.9–10.3)
Chloride: 104 mmol/L (ref 98–111)
Creatinine, Ser: 0.76 mg/dL (ref 0.44–1.00)
GFR calc Af Amer: >60 mL/min (ref >60)
GFR calc non Af Amer: >60 mL/min (ref >60)
Glucose, Bld: 206 mg/dL — ABNORMAL HIGH (ref 70–99)
Potassium: 4.5 mmol/L (ref 3.5–5.1)
Sodium: 134 mmol/L — ABNORMAL LOW (ref 135–145)

## 2020-07-30 LAB — CBC
HCT: 32 % — ABNORMAL LOW (ref 36.0–46.0)
Hemoglobin: 9.4 g/dL — ABNORMAL LOW (ref 12.0–15.0)
MCH: 21.5 pg — ABNORMAL LOW (ref 26.0–34.0)
MCHC: 29.4 g/dL — ABNORMAL LOW (ref 30.0–36.0)
MCV: 73.2 fL — ABNORMAL LOW (ref 80.0–100.0)
Platelets: 166 10*3/uL (ref 150–400)
RBC: 4.37 MIL/uL (ref 3.87–5.11)
RDW: 15.5 % (ref 11.5–15.5)
WBC: 14.7 10*3/uL — ABNORMAL HIGH (ref 4.0–10.5)
nRBC: 0 % (ref 0.0–0.2)

## 2020-07-30 LAB — GLUCOSE, CAPILLARY
Glucose-Capillary: 187 mg/dL — ABNORMAL HIGH (ref 70–99)
Glucose-Capillary: 216 mg/dL — ABNORMAL HIGH (ref 70–99)
Glucose-Capillary: 206 mg/dL — ABNORMAL HIGH (ref 70–99)
Glucose-Capillary: 191 mg/dL — ABNORMAL HIGH (ref 70–99)
Glucose-Capillary: 215 mg/dL — ABNORMAL HIGH (ref 70–99)
Glucose-Capillary: 237 mg/dL — ABNORMAL HIGH (ref 70–99)

## 2020-07-30 MED ORDER — METOPROLOL TARTRATE 50 MG PO TABS
100.0000 mg | ORAL_TABLET | Freq: Two times a day (BID) | ORAL | Status: DC
  Administered 2020-07-30: 100 mg via ORAL
  Filled 2020-07-30: qty 2

## 2020-07-30 NOTE — Progress Notes (Signed)
NAMERomelle Paul, MRN:  [id number], DOB:  Mar 26, 1946, LOS: 4 ADMISSION DATE:  07/26/2020, CONSULTATION DATE:  07/26/20 REFERRING MD:  Dr. Estanislado Pandy, CHIEF COMPLAINT:  L sided weakness   Brief History   74 y/o F admitted on 8/1 with R ACA/MCA occulusion with L sided hemiparesis and aphasia. s/p Neuro IR Revascularization.   Significant Hospital Events   8/1: Admit with R MCA / ACA occlusion s/p revascularization  8/2: O/N Hypotensive Phenylephrine IV started    Consults: date of consult/date signed off & final recs:  07/26/20: PCCM Procedures (surgical and bedside):  8/1 >> ETT  8/1 >> L Radial Aline Significant Diagnostic Tests:   ECHO 07/03/20 >> LVEF 65-70%, narrow LVOT, turbulent flow through the LV/LVOT, no RWMA, mild LVH, RV systolic function normal, normal PASP.  CT Code Stroke 8/1 >> no acute CT finding, chronic small vessel ischemic changes  CTA Head / Neck 8/1 >> embolic occlusion at the right carotid terminus. Minimal flow seen in the right MCA territory. Right ACA appears normally perfused, probably due to a patent anterior communicating artery.  Core infarct right frontal operculum 16cc, large penumbra, 136 cc in the remainder of the R MCA territory. No atherosclerotic disease at either carotid bifurcation.    CT Head 8/2: >>New mass effect in the anterior right frontal lobe with regional   increasing mixed hypo- and hyperdensity. Favor a combination of   anterior right MCA territory cytotoxic edema and petechial hemorrhage. No malignant   hemorrhage.    MR Brain wo contrast 8/2>>Multifocal ischemic infarction within the R MCA Territory,   greatest at the frontal operculum and insula. Additional cortical ischemia within the R ACA   territory and R PCA territory. Large area of infarction in the R basal ganglia with petechial   hemorrhage vs contrast staining. Punctate focus of acute ischemia in L occipital lobe.   Subjective:  Samantha Paul was seen and evaluated at bedside this AM. She  followed simple commands on the R side with translation by her son at bedside.   Objective   Blood pressure 140/66, pulse 100, temperature 99.1 F (37.3 C), temperature source Axillary, resp. rate 19, height _0  (1.575 m), weight 61.4 kg, SpO2 100 %.    Vent Mode: PRVC FiO2 (%):  [40 %] 40 % Set Rate:  [16 bmp] 16 bmp Vt Set:  [400 mL] 400 mL PEEP:  [5 cmH20] 5 cmH20 Pressure Support:  [8 cmH20] 8 cmH20 Plateau Pressure:  [13 cmH20-16 cmH20] 13 cmH20   Intake/Output Summary (Last 24 hours) at 07/30/2020 0735 Last data filed at 07/30/2020 0600 Gross per 24 hour  Intake 1149.72 ml  Output 975 ml  Net 174.72 ml   Filed Weights   07/26/20 1736 07/29/20 0500 07/30/20 0500  Weight: 60.7 kg 62.3 kg 61.4 kg    Examination: Physical Exam Constitutional:      General: She is not in acute distress.    Appearance: She is not ill-appearing or toxic-appearing.     Comments: Opens eyes to verbal stimuli. Follows commands on the R side.  HENT:     Head: Normocephalic and atraumatic.  Eyes:     Comments: Pupils reactive to light, irregular in appearance.   Pulmonary:     Breath sounds: No wheezing, rhonchi or rales.     Comments: Intubated  Abdominal:     General: Abdomen is flat. Bowel sounds are normal.     Palpations: Abdomen is soft.  Neurological:  Mental Status: She is alert.     Comments: Able to follow commands on R side, 1/5 strength in the RLE, 2/5 strength RUE grip, 1/5 in RUE on extension.      Resolved Hospital Problem list    Assessment & Plan:  R ACA / MCA Occlusion / Acute CVA S/p revascularization, large penumbra noted on initial CT imaging. 8/2 MRI showed R ACA and PCA cortical ischemia, large area of infarction in the R basal ganglia with petechial hemorrhage vs contrast staining, with punctate focus of acute ischemia in L occiptial lobe.  - SBP goal 120-160 - Crestor 20 mg QD - Recommendations per Neuro  Acute Respiratory Failure in setting of Acute CVA  Requiring Mechanical Ventilation Approximately similar in disposition today compared to yesterday, but still follows commands. -Wean PEEP / FiO2 for sats > 90% -Daily SBT  -follow intermittent CXR   Atrial Fibrillation: On Cardizem metoprolol, eliquis prior to admit, CHADS-VASC 6. Patient still tachycardic with pulses ranging 105-130 during interview.  -no anticoagulation / rate control for now with risk of hemorrhagic conversion  - tele monitoring  - Diltiazem 60 mg suspension Q6H, Dilt drip stopped. - Increase Lopressor to 100 mg BID.  Hypotension:  - Pressures have been stable since Phenylephrine has been DC'd.  - Continue to monitor BP with goals of MAP >65  And SBP of 100-140  Microcytic Anemia: - QD CBC - Hgb 10.1<10.2  DM II with Hyperglycemia  -SSI  - A1c 8.7  Disposition / Summary of Today's Plan 07/30/20   Patient still tachycardic, will increase metoprolol for rate control of A. Fib. Family discussion today for [place] discussion to determine next steps. Palliative consulted to help with GOC and discussing long term management of Samantha Paul condition.     Nutritional status and diet: NPO Pain/Anxiety/Delirium reduction: PAD VAP protocol (if indicated) Yes DVT prophylaxis: SCDs GI prophylaxis: PPI Hyperglycemia protocol: SSI Mobility:Bed bound Code Status: FULL Family Communication: Per primary   Labs and Ancillary Testing (personally reviewed)  CBC: CBC Latest Ref Rng & Units 07/29/2020 07/28/2020 07/27/2020  WBC 4.0 - 10.5 K/uL 17.8(H) 13.3(H) 11.5(H)  Hemoglobin 12.0 - 15.0 g/dL 10.1(L) 10.2(L) 10.2(L)  Hematocrit 36 - 46 % 35.2(L) 34.8(L) 35.9(L)  Platelets 150 - 400 K/uL 188 209 235    Chemistry: CMP Latest Ref Rng & Units 07/29/2020 07/28/2020 07/27/2020  Glucose 70 - 99 mg/dL 196(H) 180(H) 110(H)  BUN 8 - 23 mg/dL _0 Creatinine 0.44 - 1.00 mg/dL 0.92 0.95 0.94  Sodium 135 - 145 mmol/L 135 136 138  Potassium 3.5 - 5.1 mmol/L 4.1 4.1 4.6  Chloride 98 -  111 mmol/L 104 105 108  CO2 22 - 32 mmol/L 21(L) 19(L) 22  Calcium 8.9 - 10.3 mg/dL 8.2(L) 8.2(L) 8.6(L)  Total Protein 6.5 - 8.1 g/dL - - -  Total Bilirubin 0.3 - 1.2 mg/dL - - -  Alkaline Phos 38 - 126 U/L - - -  AST 15 - 41 U/L - - -  ALT 0 - 44 U/L - - -   ABG:    Component Value Date/Time   PHART 7.373 07/26/2020 1720   PCO2ART 31.1 (L) 07/26/2020 1720   PO2ART 210 (H) 07/26/2020 1720   HCO3 17.8 (L) 07/26/2020 1720   TCO2 21 (L) 07/26/2020 1215   ACIDBASEDEF 6.6 (H) 07/26/2020 1720   O2SAT 99.4 07/26/2020 1720    CBG: CBG (last 3)  Recent Labs    07/29/20 1942 07/29/20 2341  07/30/20 0320  GLUCAP 183* 208* 187*     Microbiology:  8/1>>MRSA PCR Screening: Negative  8/1>> SARS Coronavirus: Negative  Review of Systems:   N/A as patient is on mechanical ventilator  Past medical history  She,  has a past medical history of Afib ([place]) (06/2020), Back pain, CVA (cerebral vascular accident) ([place]) (07/26/2020), DM II (diabetes mellitus, type II), controlled ([employer]), HTN (hypertension), Knee pain, and Stroke ([place]) (?).   Surgical History    Past Surgical History:  Procedure Laterality Date   CHOLECYSTECTOMY N/A 06/30/2020   Procedure: LAPAROSCOPIC CHOLECYSTECTOMY;  Surgeon: Georganna Skeans, MD;  Location: [facility];  Service: General;  Laterality: N/A;   IR ANGIO VERTEBRAL SEL SUBCLAVIAN INNOMINATE UNI R MOD SED  07/29/2020   IR PERCUTANEOUS ART THROMBECTOMY/INFUSION INTRACRANIAL INC DIAG ANGIO  07/26/2020   RADIOLOGY WITH ANESTHESIA N/A 07/26/2020   Procedure: IR WITH ANESTHESIA;  Surgeon: Radiologist, Medication, MD;  Location: [place] [facility];  Service: Radiology;  Laterality: N/A;     Social History   Social History   Socioeconomic History   Marital status: Married    Spouse name: Not on file   Number of children: Not on file   Years of education: Not on file   Highest education level: Not on file  Occupational History   Not on file  Tobacco Use   Smoking status:  Never Smoker   Smokeless tobacco: Never Used  Substance and Sexual Activity   Alcohol use: Not Currently   Drug use: Not Currently   Sexual activity: Not on file  Other Topics Concern   Not on file  Social History Narrative   Not on file   Social Determinants of Health   Financial Resource Strain:    Difficulty of Paying Living Expenses:   Food Insecurity:    Worried About Charity fundraiser in the Last Year:    Arboriculturist in the Last Year:   Transportation Needs:    Film/video editor (Medical):    Lack of Transportation (Non-Medical):   Physical Activity:    Days of Exercise per Week:    Minutes of Exercise per Session:   Stress:    Feeling of Stress :   Social Connections:    Frequency of Communication with Friends and Family:    Frequency of Social Gatherings with Friends and Family:    Attends Religious Services:    Active Member of Clubs or Organizations:    Attends Archivist Meetings:    Marital Status:   Intimate Partner Violence:    Fear of Current or Ex-Partner:    Emotionally Abused:    Physically Abused:    Sexually Abused:   ,  reports that she has never smoked. She has never used smokeless tobacco. She reports previous alcohol use. She reports previous drug use.   Family history   Her family history includes Arthritis in her brother.   Allergies Allergies  Allergen Reactions   Metformin And Related Diarrhea    Abdominal pain and diarrhea    Home meds  Prior to Admission medications   Medication Sig Start Date End Date Taking? Authorizing Provider  acetaminophen (TYLENOL) 325 MG tablet Take 2-3 tablets (650-975 mg total) by mouth every 6 (six) hours as needed for mild pain. 07/03/20   Jill Alexanders, PA-C  apixaban (ELIQUIS) 5 MG TABS tablet Take 1 tablet (5 mg total) by mouth 2 (two) times daily. 07/03/20   Oswald Hillock, MD  blood  glucose meter kit and supplies KIT Dispense based on patient and  insurance preference. Use up to four times daily as directed. (FOR ICD-10--E11.9). 07/25/18   Love, Ivan Anchors, PA-C  diltiazem (CARTIA XT) 240 MG 24 hr capsule Take 1 capsule (240 mg total) by mouth daily. 07/03/20 07/03/21  Oswald Hillock, MD  insulin glargine (LANTUS) 100 UNIT/ML Solostar Pen Inject 20 Units into the skin daily. 07/03/20   Oswald Hillock, MD  Insulin Pen Needle (PEN NEEDLES) 32G X 4 MM MISC Use as directed with insulin pen 07/03/20   Oswald Hillock, MD  metFORMIN (GLUCOPHAGE) 500 MG tablet Take 1 tablet (500 mg total) by mouth 2 (two) times daily with a meal. 07/03/20 07/03/21  Oswald Hillock, MD  metoprolol tartrate (LOPRESSOR) 25 MG tablet Take 0.5 tablets (12.5 mg total) by mouth 2 (two) times daily. 07/03/20   Oswald Hillock, MD  oxyCODONE (OXY IR/ROXICODONE) 5 MG immediate release tablet Take 1 tablet (5 mg total) by mouth every 6 (six) hours as needed for moderate pain or severe pain (pain not releived by tylenol or ibuprofen). 07/03/20   Jill Alexanders, PA-C  rosuvastatin (CRESTOR) 20 MG tablet TAKE 1 TABLET(20 MG) BY MOUTH DAILY 12/20/18   Frann Rider, NP  senna-docusate (SENOKOT-S) 8.6-50 MG tablet Take 2 tablets by mouth at bedtime. 07/25/18   Bary Leriche, PA-C    Maudie Mercury, MD IMTS, PGY-2 Pager: [phone] 07/30/2020,7:35 AM

## 2020-07-30 NOTE — Progress Notes (Signed)
Referring Physician(s): Code stroke- Kerney Elbe  Supervising Physician: Luanne Bras  Patient Status:  [facility] - In-pt  Chief Complaint: None- intubated without sedation  Subjective:  Acute CVA s/p cerebral arteriogram with emergent mechanical thrombectomy of right ICA terminus occlusion achieving a TICI 2c/3 revascularization via right femoral approach 07/26/2020 by Dr. Estanislado Pandy. Patient laying in bed intubated without sedation. She opens eyes to voice/gentle touch and follows simple commands. Family at bedside. Can spontaneously move right side, left side withdraws from pain. Right groin puncture site c/d/i.   Allergies: Metformin and related  Medications:  Current Facility-Administered Medications:  .   stroke: mapping our early stages of recovery book, , Does not apply, Once, Kerney Elbe, MD .  0.9 %  sodium chloride infusion, , Intravenous, Continuous, Agarwala, Ravi, MD, Last Rate: 10 mL/hr at 07/30/20 0600, Rate Verify at 07/30/20 0600 .  0.9 %  sodium chloride infusion, 250 mL, Intravenous, Continuous, Oletta Darter Virgina Evener, MD, Held at 07/26/20 2356 .  acetaminophen (TYLENOL) tablet 650 mg, 650 mg, Oral, Q4H PRN **OR** acetaminophen (TYLENOL) 160 MG/5ML solution 650 mg, 650 mg, Per Tube, Q4H PRN, 650 mg at 07/30/20 0857 **OR** acetaminophen (TYLENOL) suppository 650 mg, 650 mg, Rectal, Q4H PRN, Kerney Elbe, MD .  acetaminophen (TYLENOL) tablet 650 mg, 650 mg, Oral, Q4H PRN **OR** acetaminophen (TYLENOL) 160 MG/5ML solution 650 mg, 650 mg, Per Tube, Q4H PRN **OR** acetaminophen (TYLENOL) suppository 650 mg, 650 mg, Rectal, Q4H PRN, Deveshwar, Sanjeev, MD .  aspirin chewable tablet 81 mg, 81 mg, Per Tube, Daily, Garvin Fila, MD, 81 mg at 07/30/20 0856 .  bethanechol (URECHOLINE) tablet 10 mg, 10 mg, Per Tube, TID, Hunsucker, Bonna Gains, MD, 10 mg at 07/30/20 0856 .  chlorhexidine gluconate (MEDLINE KIT) (PERIDEX) 0.12 % solution 15 mL, 15 mL, Mouth Rinse, BID,  Kerney Elbe, MD, 15 mL at 07/30/20 0855 .  Chlorhexidine Gluconate Cloth 2 % PADS 6 each, 6 each, Topical, Q0600, Kerney Elbe, MD, 6 each at 07/30/20 0151 .  diltiazem (CARDIZEM) 10 mg/ml oral suspension 60 mg, 60 mg, Per Tube, Q6H, Agarwala, Ravi, MD, 60 mg at 07/30/20 0503 .  docusate (COLACE) 50 MG/5ML liquid 100 mg, 100 mg, Per Tube, BID, Agarwala, Ravi, MD, 100 mg at 07/28/20 2139 .  feeding supplement (OSMOLITE 1.2 CAL) liquid 1,000 mL, 1,000 mL, Per Tube, Continuous, Agarwala, Ravi, MD, Last Rate: 40 mL/hr at 07/30/20 0155, 1,000 mL at 07/30/20 0155 .  feeding supplement (PROSource TF) liquid 45 mL, 45 mL, Per Tube, TID, Agarwala, Ravi, MD, 45 mL at 07/30/20 0857 .  fentaNYL (SUBLIMAZE) bolus via infusion 25 mcg, 25 mcg, Intravenous, Q15 min PRN, Byrum, Rose Fillers, MD .  fentaNYL (SUBLIMAZE) injection 25 mcg, 25 mcg, Intravenous, Once, Byrum, Rose Fillers, MD .  insulin aspart (novoLOG) injection 0-15 Units, 0-15 Units, Subcutaneous, Q4H, Kerney Elbe, MD, 5 Units at 07/30/20 [id number] .  MEDLINE mouth rinse, 15 mL, Mouth Rinse, 10 times per day, Kerney Elbe, MD, 15 mL at 07/30/20 0555 .  metoprolol tartrate (LOPRESSOR) tablet 50 mg, 50 mg, Per Tube, BID, Agarwala, Ravi, MD, 50 mg at 07/30/20 0856 .  pantoprazole sodium (PROTONIX) 40 mg/20 mL oral suspension 40 mg, 40 mg, Per Tube, QHS, Garvin Fila, MD, 40 mg at 07/29/20 2223 .  phenylephrine (NEOSYNEPHRINE) 10-0.9 MG/250ML-% infusion, 0-200 mcg/min, Intravenous, Titrated, Kipp Brood, MD, Stopped at 07/28/20 1018 .  rosuvastatin (CRESTOR) tablet 20 mg, 20 mg, Per Tube, Daily, Ollis, Brandi L, NP, 20 mg  at 07/30/20 0856 .  senna-docusate (Senokot-S) tablet 1 tablet, 1 tablet, Oral, QHS PRN, Kerney Elbe, MD    Vital Signs: BP 140/66   Pulse 100   Temp 99.7 F (37.6 C) (Axillary)   Resp 19   Ht 5' 2"  (1.575 m)   Wt 61.4 kg   SpO2 100%   BMI 24.76 kg/m   Physical Exam Vitals and nursing note reviewed.  Constitutional:       General: She is not in acute distress.    Comments: Intubated without sedation.  Pulmonary:     Effort: Pulmonary effort is normal. No respiratory distress.     Comments: Intubated without sedation. Skin:    General: Skin is warm and dry.     Comments: Right groin puncture site soft without active bleeding or hematoma.  Neurological:     Comments: Intubated without sedation. She opens eyes to voice/gentle touch and follows simple commands. PERRL bilaterally. Right gaze preference. Can spontaneously move right side (wiggles toes of LLE, left hand grip strong, lifts LUE off bed but cannot hold), left side withdraws from pain. Distal pulses (DPs) 1+ bilaterally.      Imaging: CT HEAD WO CONTRAST  Addendum Date: 07/27/2020   ADDENDUM REPORT: 07/27/2020 13:27 ADDENDUM: Study discussed by telephone with Dr. Leonie Man on 07/27/2020 at 1313 hours. He advised that follow-up MRI is pending. Electronically Signed   By: Genevie Ann M.D.   On: 07/27/2020 13:27   Result Date: 07/27/2020 CLINICAL DATA:  74 year old female status post code stroke presentation with right ICA terminus occlusion. Status post IV tPA, endovascular revascularization. EXAM: CT HEAD WITHOUT CONTRAST TECHNIQUE: Contiguous axial images were obtained from the base of the skull through the vertex without intravenous contrast. COMPARISON:  Post treatment plain head CT, CTA head, CT Perfusion, presentation head CT yesterday. FINDINGS: Brain: Mass effect now on the right lateral ventricle, especially the right frontal horn (series 6, image 13) with mixed hypo- and hyper density in the anterior right frontal lobe, right inferior frontal gyrus and right basal ganglia (series 6, image 13). No significant midline shift. No extra-axial hemorrhage or ventriculomegaly. Superimposed dystrophic basal ganglia calcifications are stable. Posterior to the anterior genu gray-white matter differentiation appears largely stable. There is possible early cytotoxic edema  at the posterior operculum. Stable left hemisphere and posterior fossa gray-white matter differentiation. No other intracranial blood products. Vascular: Calcified atherosclerosis at the skull base. No suspicious intracranial vascular hyperdensity. Skull: No acute osseous abnormality identified. Sinuses/Orbits: Intubated, small volume of fluid layering in the pharynx. Mild paranasal sinus fluid or mucosal thickening. Tympanic cavities and mastoids remain clear. Other: Disconjugate gaze now.  Scalp soft tissues remain negative. IMPRESSION: 1. New mass effect in the anterior right frontal lobe with regional increasing mixed hypo- and hyperdensity. Favor a combination of anterior right MCA territory cytotoxic edema and petechial hemorrhage. 2. No malignant hemorrhagic transformation at this time. No midline shift or loss of basilar cisterns. 3. Serial noncontrast Head CT for surveillance of mass effect recommended. Electronically Signed: By: Genevie Ann M.D. On: 07/27/2020 12:34   CT HEAD WO CONTRAST  Result Date: 07/26/2020 CLINICAL DATA:  Stroke, follow-up post revascularization. EXAM: CT HEAD WITHOUT CONTRAST TECHNIQUE: Contiguous axial images were obtained from the base of the skull through the vertex without intravenous contrast. COMPARISON:  CT angiogram head/neck and CT perfusion 07/26/2020, noncontrast head CT 07/26/2020 FINDINGS: Brain: Redemonstrated mineralization within the bilateral basal ganglia. There is subtle loss of gray-white differentiation within the right insula  consistent with acute ischemic infarction. Additionally, there is subtle asymmetric hyperdensity of the right caudate and lentiform nuclei which may reflect contrast staining/enhancement at site of acute infarction. No convincing evidence of acute intracranial hemorrhage. Stable background generalized parenchymal atrophy and chronic small vessel ischemic disease. No extra-axial fluid collection. No evidence of intracranial mass. No midline  shift. Vascular: Residual circulating contrast material limits evaluation for hyperdense vessels Skull: Normal. Negative for fracture or focal lesion. Sinuses/Orbits: Visualized orbits show no acute finding. Mild ethmoid and maxillary sinus mucosal thickening at the imaged levels. No significant mastoid effusion IMPRESSION: Subtle changes of acute ischemic infarction within the right insula. Additionally, there is subtle asymmetric hyperdensity of the right caudate and lentiform nuclei which may reflect contrast staining or enhancement at site of acute infarction. No convincing evidence of acute intracranial hemorrhage. Stable background generalized parenchymal atrophy and chronic small vessel ischemic disease. Electronically Signed   By: Kellie Simmering DO   On: 07/26/2020 15:47   MR BRAIN WO CONTRAST  Result Date: 07/27/2020 CLINICAL DATA:  Stroke follow-up EXAM: MRI HEAD WITHOUT CONTRAST TECHNIQUE: Multiplanar, multiecho pulse sequences of the brain and surrounding structures were obtained without intravenous contrast. COMPARISON:  Brain MRI 07/10/2018 FINDINGS: Brain: Multifocal ischemic infarction within the right MCA territory, greatest at the frontal operculum and insula. There is also cortical ischemia within the right ACA territory and right PCA territory. There is a punctate focus of acute ischemia in the left occipital lobe. Large area of infarction in the right basal ganglia. There is petechial hemorrhage versus is contrast staining in the right basal ganglia as well. Moderate edema associated with the ischemic areas. There is mass effect on the frontal horn of the right lateral ventricle. No herniation. There is an old left pontine infarct. No chronic microhemorrhage. Normal midline structures. Vascular: Normal flow voids. Skull and upper cervical spine: Normal marrow signal. Sinuses/Orbits: Right maxillary sinus mucosal thickening. Normal orbits. Other: None. IMPRESSION: 1. Multifocal ischemic  infarction within the right MCA territory, greatest at the frontal operculum and insula. 2. Additional cortical ischemia within the right ACA territory and right PCA territory. 3. Large area of infarction in the right basal ganglia with petechial hemorrhage versus contrast staining. 4. Punctate focus of acute ischemia in the left occipital lobe. Electronically Signed   By: Ulyses Jarred M.D.   On: 07/27/2020 23:50   CT CEREBRAL PERFUSION W CONTRAST  Result Date: 07/26/2020 CLINICAL DATA:  Acute presentation with left body weakness and speech disturbance. EXAM: CT ANGIOGRAPHY HEAD AND NECK CT PERFUSION BRAIN TECHNIQUE: Multidetector CT imaging of the head and neck was performed using the standard protocol during bolus administration of intravenous contrast. Multiplanar CT image reconstructions and MIPs were obtained to evaluate the vascular anatomy. Carotid stenosis measurements (when applicable) are obtained utilizing NASCET criteria, using the distal internal carotid diameter as the denominator. Multiphase CT imaging of the brain was performed following IV bolus contrast injection. Subsequent parametric perfusion maps were calculated using RAPID software. CONTRAST:  Not annotated COMPARISON:  Head CT earlier same day FINDINGS: CTA NECK FINDINGS Aortic arch: No aortic atherosclerotic calcification seen. No aneurysm or dissection. Branching pattern is normal with wide patency of the origins. Right carotid system: Common carotid artery widely patent to the bifurcation. No atherosclerotic disease at the carotid bifurcation. ICA widely patent. Left carotid system: Common carotid artery widely patent to the bifurcation. Carotid bifurcation is normal without soft or calcified plaque. ICA widely patent. Vertebral arteries: Both vertebral artery origins are widely  patent. Both vertebral arteries are normal through the cervical region to the foramen magnum. Skeleton: Ordinary cervical spondylosis. Other neck: No mass or  lymphadenopathy. Upper chest: No active chest disease. 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 Review of the MIP images confirms the above findings CTA HEAD FINDINGS Anterior circulation: Complete occlusion of the right carotid terminus. Minimal flow visible in the right MCA territory. Right ACA receives it supply through the anterior communicating route. No left stenosis or occlusion. Right PCA arises from the carotid artery immediately proximal to the occlusion. Posterior circulation: Both vertebral arteries patent to the basilar. Atherosclerotic narrowing the distal vertebral arteries and of the basilar. Posterior circulation branch vessels are patent. Right PCA receives most of it supply from the anterior circulation. Venous sinuses: Patent and normal. Anatomic variants: None significant. Review of the MIP images confirms the above findings CT Brain Perfusion Findings: ASPECTS: 10 CBF (<30%) Volume: 59m Perfusion (Tmax>6.0s) volume: 1533mMismatch Volume: 13696mnfarction Location:Right frontal operculum IMPRESSION: Embolic occlusion at the right carotid terminus. Minimal flow seen in the right MCA territory. Right ACA territory appears normally perfused, probably due to a patent anterior communicating artery. 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 Large penumbra, 136 cc in the remainder of the right MCA territory. No aortic atherosclerotic disease seen. No atherosclerotic disease at either carotid bifurcation. Some atherosclerotic narrowing of the distal vertebral arteries and basilar artery. Preliminary report sent by text at 1238 hours. Discussed with stroke PA 1242 hours. Electronically Signed   By: MarNelson ChimesD.   On: 07/26/2020 12:48   DG Chest Port 1 View  Result Date: 07/27/2020 CLINICAL DATA:  History of stroke and hypertension EXAM: PORTABLE CHEST 1 VIEW COMPARISON:  07/26/2020 FINDINGS: Endotracheal and enteric  tubes are unchanged in position. Shallow inspiration. Cardiac enlargement. No vascular congestion or edema. Lungs are clear with improvement since prior study. No effusions. Surgical clips in the right upper quadrant. IMPRESSION: Cardiac enlargement. No evidence of active pulmonary disease. Improvement since prior study. Electronically Signed   By: WilLucienne CapersD.   On: 07/27/2020 06:35   DG CHEST PORT 1 VIEW  Result Date: 07/26/2020 CLINICAL DATA:  Inhibition EXAM: PORTABLE CHEST 1 VIEW COMPARISON:  Chest x-ray dated 06/29/2020. FINDINGS: Endotracheal tube appears well positioned with tip approximately 3 cm above the carina. Enteric tube passes below the diaphragm. Cardiomegaly. Central pulmonary vascular congestion. No pleural effusion or pneumothorax is seen. IMPRESSION: 1. Endotracheal tube appears well positioned with tip approximately 3 cm above the carina. 2. Cardiomegaly with central pulmonary vascular congestion indicating CHF/volume overload. Electronically Signed   By: StaFranki CabotD.   On: 07/26/2020 16:58   DG Abd Portable 1V  Result Date: 07/29/2020 CLINICAL DATA:  Feeding into placement EXAM: PORTABLE ABDOMEN - 1 VIEW COMPARISON:  July 26, 2020 FINDINGS: Nasogastric tube removed. Feeding tube present with tip in the stomach. Visualized bowel unremarkable. No free air evident. Visualized lungs clear. Endotracheal tube tip is 4.0 cm above the carina. IMPRESSION: Feeding tube tip in stomach. Visualized bowel gas pattern unremarkable. Visualized lungs clear. Electronically Signed   By: WilLowella GripI M.D.   On: 07/29/2020 12:02   DG Abd Portable 1V  Result Date: 07/26/2020 CLINICAL DATA:  OG tube placement EXAM: PORTABLE ABDOMEN - 1 VIEW COMPARISON:  None. FINDINGS: Enteric tube appears well positioned in the stomach. Visualized bowel gas pattern is nonobstructive. Cholecystectomy clips in  the RIGHT upper quadrant. IMPRESSION: Enteric tube appears well positioned in the stomach.  Electronically Signed   By: Franki Cabot M.D.   On: 07/26/2020 16:57   IR PERCUTANEOUS ART THROMBECTOMY/INFUSION INTRACRANIAL INC DIAG ANGIO  Result Date: 07/28/2020 INDICATION: Acute onset of left-sided hemiplegia, right gaze deviation and slurred speech. Occluded right internal carotid artery supraclinoid segment, right middle cerebral artery and the right anterior cerebral artery. EXAM: 1. EMERGENT LARGE VESSEL OCCLUSION THROMBOLYSIS (anterior CIRCULATION) COMPARISON:  CT angiogram of the head and neck of July 26, 2020. MEDICATIONS: Ancef 2 g IV antibiotic was administered within 1 hour of the procedure. ANESTHESIA/SEDATION: General anesthesia CONTRAST:  Isovue 300 approximately 100 mL FLUOROSCOPY TIME:  Fluoroscopy Time: 38 minutes 36 seconds (1410 mGy). COMPLICATIONS: None immediate. TECHNIQUE: Following a full explanation of the procedure along with the potential associated complications, an informed witnessed consent was obtained the patient's spouse via an interpreter. The risks of intracranial hemorrhage of 10%, worsening neurological deficit, ventilator dependency, death and inability to revascularize were all reviewed in detail with the patient's spouse. The patient was then put under general anesthesia by the Department of Anesthesiology at [facility]. The right groin was prepped and draped in the usual sterile fashion. Thereafter using modified Seldinger technique, transfemoral access into the right common femoral artery was obtained without difficulty. Over a 0.035 inch guidewire a 5 French Pinnacle sheath was inserted. Through this, and also over a 0.035 inch guidewire a 8 French 25 cm catheter was advanced to the aortic arch region and selectively positioned in the innominate artery and right common carotid artery. FINDINGS: Innominate arteriogram demonstrates the origin of the right subclavian artery and the right common carotid artery to be widely patent. Moderate tortuosity is seen  of the right common carotid artery proximally. The right vertebral artery origin is widely patent. The vessel is seen to opacify to the cranial skull base. The opacified portion of the basilar artery on the AP projection demonstrates patency with suggestion of a 50% stenosis of the right vertebrobasilar junction. The right common carotid arteriogram demonstrates the right external carotid artery and its major branches to be widely patent. The right internal carotid artery at the bulb to the cranial skull base is patent with moderate severe tortuosity involving the mid right M1 segment associated with a 50% stenosis. The petrous and proximal cavernous segments are widely patent. There is complete angiographic occlusion of the right internal carotid artery just distal to the origin of the right posterior communicating artery supplying the right posterior cerebral artery distribution. PROCEDURE: The diagnostic JB 1 catheter in the right common carotid artery was exchanged over a 0.035 inch 300 cm Rosen exchange guidewire for an 087 95 cm balloon guide catheter which had been prepped with 50% contrast and 50% heparinized saline infusion. Guidewire was removed. Good aspiration obtained from the hub of the balloon guide catheter. A gentle contrast injection demonstrated no evidence of vasospasm, or of intraluminal filling defects. Over a 0.014 inch standard Synchro micro guidewire, an 027 160 cm Marksman microcatheter inside of an 071 135 cm Zoom aspiration catheter was advanced without difficulty to the distal cavernous segment of the right internal carotid artery. The micro guidewire was then gently manipulated without difficulty and advanced into the inferior division of the right middle cerebral artery M2 M3 region followed by the microcatheter. The guidewire was removed. Mild resistance was again to aspiration which cleared with more proximal position of the microcatheter. A gentle control arteriogram performed  through the microcatheter demonstrated safe position of tip of the microcatheter, which was connected to continuous heparinized saline infusion. A 3 mm x 20 mm Solitaire X retrieval device was then advanced to the distal end of the microcatheter. The O ring on the delivery microcatheter was loosened. With slight forward gentle traction with the right hand on the delivery micro guidewire with the left hand the delivery microcatheter was retrieved unsheathing the retrieval device. The 071 aspiration catheter was engaged in the occluded right middle cerebral artery M1 segment. Continued aspiration was applied using an aspiration device with proximal flow arrest in the right internal carotid artery, and aspiration using a 60 mL syringe at the hub of the balloon guide catheter for approximately 2-1/2 minutes. Thereafter, the combination of the retrieval device, the microcatheter, aspiration catheter was retrieved and removed as aspiration was applied at the hub of the balloon guide catheter. Large clot and small pieces were seen from within the aspiration catheter. A control arteriogram performed through the balloon guide following reversal of flow arrest in the right internal carotid artery demonstrated wide patency of the superior and inferior divisions of the right middle cerebral artery. A control arteriogram performed following infusion of 25 mcg of nitroglycerin intra-arterially now demonstrated significantly improved caliber and flow through the superior and inferior divisions. There continued be slow flow involving the M3 M4 branches of the anterior of peri insular branches consistent with the site of the core seen on the CT perfusion studies. A TICI 2C revascularization of the right MCA distribution had been achieved. The right anterior cerebral artery was now also widely patent with a TICI 3 revascularization. Patency of the right posterior communicating artery and the right posterior cerebral artery territory  was intact. Control arteriogram performed through the balloon guide in the right common carotid artery demonstrated free flow in the right internal carotid artery extra cranially and intracranially. This was then removed. The 8 French Pinnacle sheath in the right groin was replaced with an 8 French Angio-Seal closure device for hemostasis. The distal pulses remained Dopplerable in both the DPs and posterior tibial arteries bilaterally unchanged. Post procedure CT scan of the brain demonstrated contrast blush in the right basal ganglia region. No gross mass effect or midline shift was noted. The patient was left intubated on account of the patient's neurologic condition prior to intubation. She was then transported to the neuro ICU for post thrombectomy care. IMPRESSION: Status post endovascular revascularization of occluded right internal carotid artery supraclinoid segment, the right middle cerebral and the right anterior cerebral artery with 1 pass with a 3 mm x 20 mm Solitaire X retrieval device, contact aspiration and proximal flow arrest achieving a TICI 2C revascularization. PLAN: Follow-up as per referring MD. Electronically Signed   By: Luanne Bras M.D.   On: 07/27/2020 14:02   CT HEAD CODE STROKE WO CONTRAST  Result Date: 07/26/2020 CLINICAL DATA:  Code stroke. Left-sided weakness. Right gaze disturbance. EXAM: CT HEAD WITHOUT CONTRAST TECHNIQUE: Contiguous axial images were obtained from the base of the skull through the vertex without intravenous contrast. COMPARISON:  07/09/2018 FINDINGS: Brain: Age related volume loss. Chronic small-vessel ischemic changes of the brainstem, cerebellum and cerebral hemispheres. Physiologic basal ganglia calcification. No visible acute infarction, mass lesion, hemorrhage, hydrocephalus or extra-axial collection. Vascular: There is atherosclerotic calcification of the major vessels at the base of the brain. Skull: Negative Sinuses/Orbits: Clear/normal Other: None  ASPECTS ([place] Stroke Program Early CT Score) - Ganglionic level infarction (caudate, lentiform nuclei,  internal capsule, insula, M1-M3 cortex): 7 - Supraganglionic infarction (M4-M6 cortex): 3 Total score (0-10 with 10 being normal): 10 IMPRESSION: 1. No acute CT finding. Chronic small-vessel ischemic changes throughout the brain. Physiologic calcification of basal ganglia. 2. ASPECTS is 10 3. Discussed with Dr. Cheral Marker during interpretation at 12 17 hours. Electronically Signed   By: Nelson Chimes M.D.   On: 07/26/2020 12:22   CT ANGIO HEAD CODE STROKE  Result Date: 07/26/2020 CLINICAL DATA:  Acute presentation with left body weakness and speech disturbance. EXAM: CT ANGIOGRAPHY HEAD AND NECK CT PERFUSION BRAIN TECHNIQUE: Multidetector CT imaging of the head and neck was performed using the standard protocol during bolus administration of intravenous contrast. Multiplanar CT image reconstructions and MIPs were obtained to evaluate the vascular anatomy. Carotid stenosis measurements (when applicable) are obtained utilizing NASCET criteria, using the distal internal carotid diameter as the denominator. Multiphase CT imaging of the brain was performed following IV bolus contrast injection. Subsequent parametric perfusion maps were calculated using RAPID software. CONTRAST:  Not annotated COMPARISON:  Head CT earlier same day FINDINGS: CTA NECK FINDINGS Aortic arch: No aortic atherosclerotic calcification seen. No aneurysm or dissection. Branching pattern is normal with wide patency of the origins. Right carotid system: Common carotid artery widely patent to the bifurcation. No atherosclerotic disease at the carotid bifurcation. ICA widely patent. Left carotid system: Common carotid artery widely patent to the bifurcation. Carotid bifurcation is normal without soft or calcified plaque. ICA widely patent. Vertebral arteries: Both vertebral artery origins are widely patent. Both vertebral arteries are normal through  the cervical region to the foramen magnum. Skeleton: Ordinary cervical spondylosis. Other neck: No mass or lymphadenopathy. Upper chest: No active chest disease. 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 Review of the MIP images confirms the above findings CTA HEAD FINDINGS Anterior circulation: Complete occlusion of the right carotid terminus. Minimal flow visible in the right MCA territory. Right ACA receives it supply through the anterior communicating route. No left stenosis or occlusion. Right PCA arises from the carotid artery immediately proximal to the occlusion. Posterior circulation: Both vertebral arteries patent to the basilar. Atherosclerotic narrowing the distal vertebral arteries and of the basilar. Posterior circulation branch vessels are patent. Right PCA receives most of it supply from the anterior circulation. Venous sinuses: Patent and normal. Anatomic variants: None significant. 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 Minimal flow seen in the right MCA territory. Right ACA territory appears normally perfused, probably due to a patent anterior communicating artery. 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 Large penumbra, 136 cc in the remainder of the right MCA territory. No aortic atherosclerotic disease seen. No atherosclerotic disease at either carotid bifurcation. Some atherosclerotic narrowing of the distal vertebral arteries and basilar artery. Preliminary report sent by text at 1238 hours. Discussed with stroke PA 1242 hours. Electronically Signed   By: MarNelson ChimesD.   On: 07/26/2020 12:48   CT ANGIO NECK CODE STROKE  Result Date: 07/26/2020 CLINICAL DATA:  Acute  presentation with left body weakness and speech disturbance. EXAM: CT ANGIOGRAPHY HEAD AND NECK CT PERFUSION BRAIN TECHNIQUE: Multidetector CT imaging of the head and neck was performed using the standard protocol during bolus administration of intravenous contrast. Multiplanar  CT image reconstructions and MIPs were obtained to evaluate the vascular anatomy. Carotid stenosis measurements (when applicable) are obtained utilizing NASCET criteria, using the distal internal carotid diameter as the denominator. Multiphase CT imaging of the brain was performed following IV bolus contrast injection. Subsequent parametric perfusion maps were calculated using RAPID software. CONTRAST:  Not annotated COMPARISON:  Head CT earlier same day FINDINGS: CTA NECK FINDINGS Aortic arch: No aortic atherosclerotic calcification seen. No aneurysm or dissection. Branching pattern is normal with wide patency of the origins. Right carotid system: Common carotid artery widely patent to the bifurcation. No atherosclerotic disease at the carotid bifurcation. ICA widely patent. Left carotid system: Common carotid artery widely patent to the bifurcation. Carotid bifurcation is normal without soft or calcified plaque. ICA widely patent. Vertebral arteries: Both vertebral artery origins are widely patent. Both vertebral arteries are normal through the cervical region to the foramen magnum. Skeleton: Ordinary cervical spondylosis. Other neck: No mass or lymphadenopathy. Upper chest: No active chest disease. 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 Review of the MIP images confirms the above findings CTA HEAD FINDINGS Anterior circulation: Complete occlusion of the right carotid terminus. Minimal flow visible in the right MCA territory. Right ACA receives it supply through the anterior communicating route. No left stenosis or occlusion. Right PCA arises from the carotid artery  immediately proximal to the occlusion. Posterior circulation: Both vertebral arteries patent to the basilar. Atherosclerotic narrowing the distal vertebral arteries and of the basilar. Posterior circulation branch vessels are patent. Right PCA receives most of it supply from the anterior circulation. Venous sinuses: Patent and normal. Anatomic variants: None significant. Review of the MIP images confirms the above findings CT Brain Perfusion Findings: ASPECTS: 10 CBF (<30%) Volume: 31m Perfusion (Tmax>6.0s) volume: 1543mMismatch Volume: 13634mnfarction Location:Right frontal operculum IMPRESSION: Embolic occlusion at the right carotid terminus. Minimal flow seen in the right MCA territory. Right ACA territory appears normally perfused, probably due to a patent anterior communicating artery. 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 Large penumbra, 136 cc in the remainder of the right MCA territory. No aortic atherosclerotic disease seen. No atherosclerotic disease at either carotid bifurcation. Some atherosclerotic narrowing of the distal vertebral arteries and basilar artery. Preliminary report sent by text at 1238 hours. Discussed with stroke PA 1242 hours. Electronically Signed   By: MarNelson ChimesD.   On: 07/26/2020 12:48   IR ANGIO VERTEBRAL SEL SUBCLAVIAN INNOMINATE UNI R MOD SED  Result Date: 07/29/2020 INDICATION: Acute onset of left-sided hemiplegia, right gaze deviation and slurred speech.  Occluded right internal carotid artery supraclinoid segment, right middle cerebral artery and the right anterior cerebral artery.  EXAM: 1. EMERGENT LARGE VESSEL OCCLUSION THROMBOLYSIS (anterior CIRCULATION)  COMPARISON:  CT angiogram of the head and neck of July 26, 2020.  MEDICATIONS: Ancef 2 g IV antibiotic was administered within 1 hour of the procedure.  ANESTHESIA/SEDATION: General anesthesia  CONTRAST:  Isovue 300 approximately 100 mL  FLUOROSCOPY TIME:  Fluoroscopy Time: 38 minutes 36 seconds  (1410 mGy).  COMPLICATIONS: None immediate.  TECHNIQUE: Following a full explanation of the procedure along with the potential associated complications, an informed witnessed consent was obtained the patient's spouse via an interpreter. The risks of intracranial hemorrhage of 10%, worsening neurological deficit, ventilator dependency, death and inability to revascularize were all reviewed in detail with the patient's spouse.  The patient was then put under general anesthesia  by the Department of Anesthesiology at [facility].  The right groin was prepped and draped in the usual sterile fashion. Thereafter using modified Seldinger technique, transfemoral access into the right common femoral artery was obtained without difficulty. Over a 0.035 inch guidewire a 5 French Pinnacle sheath was inserted. Through this, and also over a 0.035 inch guidewire a 8 French 25 cm catheter was advanced to the aortic arch region and selectively positioned in the innominate artery and right common carotid artery.  FINDINGS: Innominate arteriogram demonstrates the origin of the right subclavian artery and the right common carotid artery to be widely patent.  Moderate tortuosity is seen of the right common carotid artery proximally.  The right vertebral artery origin is widely patent.  The vessel is seen to opacify to the cranial skull base. The opacified portion of the basilar artery on the AP projection demonstrates patency with suggestion of a 50% stenosis of the right vertebrobasilar junction.  The right common carotid arteriogram demonstrates the right external carotid artery and its major branches to be widely patent.  The right internal carotid artery at the bulb to the cranial skull base is patent with moderate severe tortuosity involving the mid right M1 segment associated with a 50% stenosis.  The petrous and proximal cavernous segments are widely patent.  There is complete angiographic occlusion of the  right internal carotid artery just distal to the origin of the right posterior communicating artery supplying the right posterior cerebral artery distribution.  PROCEDURE: The diagnostic JB 1 catheter in the right common carotid artery was exchanged over a 0.035 inch 300 cm Rosen exchange guidewire for an 087 95 cm balloon guide catheter which had been prepped with 50% contrast and 50% heparinized saline infusion.  Guidewire was removed. Good aspiration obtained from the hub of the balloon guide catheter. A gentle contrast injection demonstrated no evidence of vasospasm, or of intraluminal filling defects.  Over a 0.014 inch standard Synchro micro guidewire, an 027 160 cm Marksman microcatheter inside of an 071 135 cm Zoom aspiration catheter was advanced without difficulty to the distal cavernous segment of the right internal carotid artery.  The micro guidewire was then gently manipulated without difficulty and advanced into the inferior division of the right middle cerebral artery M2 M3 region followed by the microcatheter. The guidewire was removed. Mild resistance was again to aspiration which cleared with more proximal position of the microcatheter. A gentle control arteriogram performed through the microcatheter demonstrated safe position of tip of the microcatheter, which was connected to continuous heparinized saline infusion.  A 3 mm x 20 mm Solitaire X retrieval device was then advanced to the distal end of the microcatheter. The O ring on the delivery microcatheter was loosened. With slight forward gentle traction with the right hand on the delivery micro guidewire with the left hand the delivery microcatheter was retrieved unsheathing the retrieval device.  The 071 aspiration catheter was engaged in the occluded right middle cerebral artery M1 segment. Continued aspiration was applied using an aspiration device with proximal flow arrest in the right internal carotid artery, and aspiration using a  60 mL syringe at the hub of the balloon guide catheter for approximately 2-1/2 minutes.  Thereafter, the combination of the retrieval device, the microcatheter, aspiration catheter was retrieved and removed as aspiration was applied at the hub of the balloon guide catheter. Large clot and small pieces were seen from within the aspiration catheter.  A control arteriogram performed through the balloon guide  following reversal of flow arrest in the right internal carotid artery demonstrated wide patency of the superior and inferior divisions of the right middle cerebral artery. A control arteriogram performed following infusion of 25 mcg of nitroglycerin intra-arterially now demonstrated significantly improved caliber and flow through the superior and inferior divisions. There continued be slow flow involving the M3 M4 branches of the anterior of peri insular branches consistent with the site of the core seen on the CT perfusion studies.  A TICI 2C revascularization of the right MCA distribution had been achieved. The right anterior cerebral artery was now also widely patent with a TICI 3 revascularization.  Patency of the right posterior communicating artery and the right posterior cerebral artery territory was intact.  Control arteriogram performed through the balloon guide in the right common carotid artery demonstrated free flow in the right internal carotid artery extra cranially and intracranially.  This was then removed. The 8 French Pinnacle sheath in the right groin was replaced with an 8 French Angio-Seal closure device for hemostasis. The distal pulses remained Dopplerable in both the DPs and posterior tibial arteries bilaterally unchanged.  Post procedure CT scan of the brain demonstrated contrast blush in the right basal ganglia region. No gross mass effect or midline shift was noted.  The patient was left intubated on account of the patient's neurologic condition prior to intubation. She was then  transported to the neuro ICU for post thrombectomy care.  IMPRESSION: Status post endovascular revascularization of occluded right internal carotid artery supraclinoid segment, the right middle cerebral and the right anterior cerebral artery with 1 pass with a 3 mm x 20 mm Solitaire X retrieval device, contact aspiration and proximal flow arrest achieving a TICI 2C revascularization.  PLAN: Follow-up as per referring MD.   Electronically Signed   By: Luanne Bras M.D.   On: 07/27/2020 14:02   Labs:  CBC: Recent Labs    07/27/20 0637 07/28/20 0452 07/29/20 0830 07/30/20 0726  WBC 11.5* 13.3* 17.8* 14.7*  HGB 10.2* 10.2* 10.1* 9.4*  HCT 35.9* 34.8* 35.2* 32.0*  PLT 235 209 188 166    COAGS: Recent Labs    06/29/20 0318 07/26/20 1211  INR 1.4* 1.1  APTT 35 24    BMP: Recent Labs    07/27/20 0637 07/28/20 0452 07/29/20 0830 07/30/20 0726  NA 138 136 135 134*  K 4.6 4.1 4.1 4.5  CL 108 105 104 104  CO2 22 19* 21* 21*  GLUCOSE 110* 180* 196* 206*  BUN 19 17 22 19   CALCIUM 8.6* 8.2* 8.2* 8.1*  CREATININE 0.94 0.95 0.92 0.76  GFRNONAA >60 60* >60 >60  GFRAA >60 >60 >60 >60    LIVER FUNCTION TESTS: Recent Labs    07/01/20 0556 07/02/20 1825 07/03/20 0344 07/26/20 1211  BILITOT 0.5 0.2* 0.6 1.4*  AST 43* 25 15 20   ALT 26 19 18 16   ALKPHOS 112 109 93 90  PROT 5.3* 5.2* 5.0* 6.8  ALBUMIN 1.9* 1.8* 1.8* 3.7    Assessment and Plan:  Acute CVA s/p cerebral arteriogram with emergent mechanical thrombectomy of right ICA terminus occlusion achieving a TICI 2c/3 revascularization via right femoral approach 07/26/2020 by Dr. Estanislado Pandy. Patient's condition stable- remains intubated without sedated, opens eyes to voice/gentle touch and follows simple commands, moving right side and left side withdraws from pain. Right groin puncture site stable, distal pulses (DPs) 1+ bilaterally. No significant changes today Further plans per neurology/CCM- appreciate and agree  with management. NIR to  follow.   Electronically Signed: Ascencion Dike, PA-C 07/30/2020, 9:00 AM   I spent a total of 25 Minutes at the the patient's bedside AND on the patient's hospital floor or unit, greater than 50% of which was counseling/coordinating care for CVA s/p revascularization.

## 2020-07-30 NOTE — Progress Notes (Signed)
SLP Cancellation Note  Patient Details Name: Samantha Paul MRN: [id number] DOB: 07/14/1946   Cancelled treatment:    Remains intubated.  SLP will await readiness.  Samantha Lenoir L. Samson Frederic, MA CCC/SLP Acute Rehabilitation Services Office number [phone] Pager [phone]        Samantha Paul 07/30/2020, 11:04 AM

## 2020-07-30 NOTE — Progress Notes (Signed)
Physical Therapy Treatment Patient Details Name: Samantha Paul MRN: [id number] DOB: 02-26-1946 Today's Date: 07/30/2020    History of Present Illness 74 y.o. female with history of atrial fibrillation, HTN, DM and stroke, who presents with acute onset of right gaze deviation, left-sided hemiplegia and dense receptive and expressive aphasia. 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 Pt received tPA and underwent R ICA and MCA thrombectomy on 8/1. Follow-up MRI demonstrating moderate R MCA as well as R ACA and PCA infarcts with trace petechial hemorrhage.    PT Comments    Pt with improved tolerance for sitting edge of bed, without reduction in minute ventilation on ventilator. Pt is able to follow motor commands with R side while sitting, performing 5 LAQs and mobilizing RUE to command. Pt remains unable to track past midline toward left side, and demonstrates limited cervical strength at this time. PT provides education on PROM HEP as well as positioning of flaccid side to family. Per spouse report pt does wear heeled shoes regularly, likely responsible for ankle PF contractures. Pt will continue to benefit from aggressive mobilization and PT POC to improve mobility quality and to reduce caregiver burden.   Follow Up Recommendations  CIR     Equipment Recommendations  Wheelchair (measurements PT);Wheelchair cushion (measurements PT);Hospital bed (mechanical lift, all if home today)    Recommendations for Other Services       Precautions / Restrictions Precautions Precautions: Fall Precaution Comments: ETT, multiple lines/tubes  Restrictions Weight Bearing Restrictions: No    Mobility  Bed Mobility Overal bed mobility: Needs Assistance Bed Mobility: Supine to Sit;Sit to Supine     Supine to sit: Total assist;+2 for physical assistance Sit to supine: Total assist;+2 for physical assistance      Transfers                  General transfer comment: deferred 2/2 weakness and falls risk  Ambulation/Gait                 Stairs             Wheelchair Mobility    Modified Rankin (Stroke Patients Only) Modified Rankin (Stroke Patients Only) Pre-Morbid Rankin Score: No symptoms Modified Rankin: Severe disability     Balance Overall balance assessment: Needs assistance Sitting-balance support: Bilateral upper extremity supported;Feet unsupported Sitting balance-Leahy Scale: Zero Sitting balance - Comments: maxA to maintain sitting balance due to posterior lean, intermittent extensor tone Postural control: Posterior lean                                  Cognition Arousal/Alertness: Awake/alert;Lethargic Behavior During Therapy: Flat affect Overall Cognitive Status: Impaired/Different from baseline Area of Impairment: Attention;Following commands                   Current Attention Level: Focused   Following Commands: Follows one step commands with increased time              Exercises Other Exercises Other Exercises: PROM to RUE and RLE, 10 repetitions all joints, provided family education on PROM HEP    General Comments General comments (skin integrity, edema, etc.): pt with increased adductor tone and intermittent extensor tone noted during mobility      Pertinent Vitals/Pain Pain Assessment: Faces Faces Pain Scale: Hurts even more Pain Location: generalized Pain Descriptors /  Indicators: Grimacing Pain Intervention(s): Monitored during session    Home Living                      Prior Function            PT Goals (current goals can now be found in the care plan section) Acute Rehab PT Goals Patient Stated Goal: To improve mobility quality and reduce caregiver burden. -PT goal, pt unable to verbalize or write this session Progress towards PT goals: Progressing toward goals    Frequency    Min 4X/week      PT Plan  Current plan remains appropriate    Co-evaluation              AM-PAC PT "6 Clicks" Mobility   Outcome Measure  Help needed turning from your back to your side while in a flat bed without using bedrails?: Total Help needed moving from lying on your back to sitting on the side of a flat bed without using bedrails?: Total Help needed moving to and from a bed to a chair (including a wheelchair)?: Total Help needed standing up from a chair using your arms (e.g., wheelchair or bedside chair)?: Total Help needed to walk in hospital room?: Total Help needed climbing 3-5 steps with a railing? : Total 6 Click Score: 6    End of Session Equipment Utilized During Treatment: Oxygen Activity Tolerance: Treatment limited secondary to medical complications (Comment) (intubated and intermittently lethargic) Patient left: in bed;with call bell/phone within reach;with bed alarm set;with restraints reapplied Nurse Communication: Mobility status;Need for lift equipment PT Visit Diagnosis: Unsteadiness on feet (R26.81);Other abnormalities of gait and mobility (R26.89);Muscle weakness (generalized) (M62.81);Other symptoms and signs involving the nervous system (R29.898)     Time: 6629-4765 PT Time Calculation (min) (ACUTE ONLY): 34 min  Charges:  $Therapeutic Exercise: 8-22 mins $Therapeutic Activity: 8-22 mins                     Arlyss Gandy, PT, DPT Acute Rehabilitation Pager: [phone]    Arlyss Gandy 07/30/2020, 12:26 PM

## 2020-07-30 NOTE — Progress Notes (Signed)
STROKE TEAM PROGRESS NOTE   INTERVAL HISTORY Her  son is at the bedside.  Her neurological status remains unchanged and patient remains intubated for respiratory failure but is arousable and follows simple commands   She is weaning on the ventilator and CCM MD plans to discuss DNR status  with extubation plans prior to extubation.neurological exam and status unchanged. Vitals:   07/30/20 0745 07/30/20 0800 07/30/20 0900 07/30/20 1200  BP:  (!) 154/91 (!) 146/90   Pulse:  (!) 110 (!) 120   Resp:  20 (!) 22   Temp:  99.7 F (37.6 C)  98.7 F (37.1 C)  TempSrc:  Axillary  Axillary  SpO2: 100% 100% 100%   Weight:      Height:       CBC:  Recent Labs  Lab 07/26/20 1211 07/26/20 1215 07/29/20 0830 07/30/20 0726  WBC 6.5   < > 17.8* 14.7*  NEUTROABS 4.1  --   --   --   HGB 10.7*   < > 10.1* 9.4*  HCT 37.7   < > 35.2* 32.0*  MCV 75.1*   < > 73.6* 73.2*  PLT 185   < > 188 166   < > = values in this interval not displayed.   Basic Metabolic Panel:  Recent Labs  Lab 07/28/20 0452 07/28/20 1621 07/29/20 0419 07/29/20 0830 07/30/20 0726  NA   < >  --   --  135 134*  K   < >  --   --  4.1 4.5  CL   < >  --   --  104 104  CO2   < >  --   --  21* 21*  GLUCOSE   < >  --   --  196* 206*  BUN   < >  --   --  22 19  CREATININE   < >  --   --  0.92 0.76  CALCIUM   < >  --   --  8.2* 8.1*  MG  --  2.1 2.0  --   --   PHOS  --  3.4 3.1  --   --    < > = values in this interval not displayed.   Lipid Panel:  Recent Labs  Lab 07/27/20 0637  CHOL 147  TRIG 79  HDL 41  CHOLHDL 3.6  VLDL 16  LDLCALC 90   Lab Results  Component Value Date   HGBA1C 8.7 (H) 07/27/2020   Urine Drug Screen:  Recent Labs  Lab 07/27/20 1254  LABOPIA NONE DETECTED  COCAINSCRNUR NONE DETECTED  LABBENZ NONE DETECTED  AMPHETMU NONE DETECTED  THCU NONE DETECTED  LABBARB NONE DETECTED    Alcohol Level  Recent Labs  Lab 07/26/20 1209  ETH <10    IMAGING past 24 hours No results  found.  PHYSICAL EXAM Elderly Asian lady who is intubated sedated. Not in distress. . Afebrile. Head is nontraumatic. Neck is supple without bruit.    Cardiac exam no murmur or gallop. Lungs are clear to auscultation. Distal pulses are well felt. Neurological Exam :  Patient is intubated sedated. Eyes closed but opens with sternal rub.  She follows some simple midline and commands on the right side after translation by her husband. Right gaze preference. Unable to look to the left. Blinks to threat on the right but not on the left. Pupils were irregular but reactive. Fundi not visualized. Left lower facial weakness. Tongue midline. Purposeful antigravity  movements on the right. Left hemiplegia but will withdraw the left lower extremity to pain but not the left upper extremity. Tone is reduced on the left compared to the right. Left plantar upgoing right downgoing. Gait not tested. ASSESSMENT/PLAN [name] is a 74 y.o. female with history of atrial fibrillation, HTN, DM and stroke presenting with right gaze deviation, left-sided hemiplegia and dense receptive and expressive aphasia. She received tPA 07/26/2020 at 1259. Taken to IR for R ICA T occlusion.  Stroke:   R MCA infarct w/ petechial hemorrhage s/p tPA and IR revascularization, embolic secondary to known AF source on Eliquis  Code Stroke CT head No acute abnormality. Small vessel disease. Atrophy. ASPECTS 10.     CTA head & neck R ICA occlusion w/ min MCA flow. Some narrowing B VA and BA.   CT perfusion R frontal operculum core 16 cc, penumbra 136 R MCA territory  Cerebral angio RT ICA T occlusion with x 1 pass solitaire ->TICI 2C revascularization of RT MCA and a TICI 3 revascularization of RT ACA .  Post IR CT focal contrast stain in the RT globus pallidus. No gross mass effect or shift.   Post IR CT R insular infarct. R caudate head and lentiform nucleus hyperdensity. No hemorrhage.   CT new R frontal lobe hyper and hypointensity  (infarct, edema, petechial hemorrhage)  MRI  pending   2D Echo 07/03/2020 EF 65-70%. No source of embolus   LDL 90  HgbA1c 12.7  VTE prophylaxis - SCDs   Eliquis (apixaban) daily prior to admission, now on aspirin 81 mg. Hold antithrombotic given petechial hemorrhage noted on imaging  Therapy recommendations: Pending  Disposition:  pending   Acute Respiratory Failure  Secondary to stroke  Intubated for IR, left intubated post IR   No sedation   Hold extubation until MRI completed  CCM on board  Atrial Fibrillation w/ RVR  Home anticoagulation:  Eliquis (apixaban) daily   on diltiazem 240, metoprolol 12.5 bid  at home  Now on cardizem gtt . No AC at this time given petechial hemorrhage   Hypotension  Home meds:  None, no hx HTN . BP goal per IR x 24h following IR procedure and tPA administration.  . Currently on Phenylephrine to meet goal 100-140 x 24h  . Long-term BP goal normotensive  Hyperlipidemia  Home meds:  crestor 20, resumed in hospital  LDL 90, goal < 70  Continue statin at discharge  Diabetes type II Uncontrolled  Home meds:  lantus 20 daily, metformin 500 bid  HgbA1c 12.7, goal < 7.0  CBGs Recent Labs    07/30/20 0320 07/30/20 0744 07/30/20 1111  GLUCAP 187* 216* 206*      SSI  Dysphagia . Secondary to stroke . NPO . Speech on board   Other Stroke Risk Factors  Advanced age  Hx stroke/TIA  06/2018 left pontine infarct likely secondary to small vessel disease source. DAPT x 3 weeks.  Other Active Problems  Microcytic anemia 10.2   Leukocytosis 11.5  Hospital day # 4 Patient prognosis is guarded given significant infarcts on MRI and persistent right gaze preference and dense left hemiplegia.  Recommend trial of extubation when medically stable.  Continue aspirin 81 mg daily for her atrial fibrillation and anticoagulation only after she is able to swallow or has a PEG tube.  I had a long discussion the patient's son and  answered questions.  Family needs to to decide about one-way extubation versus continuing aggressive care  and tracheostomy and PEG tube if necessary.  Discussed with Dr. Toney Reil critical care medicine.  This patient is critically ill and at significant risk of neurological worsening, death and care requires constant monitoring of vital signs, hemodynamics,respiratory and cardiac monitoring, extensive review of multiple databases, frequent neurological assessment, discussion with family, other specialists and medical decision making of high complexity.I have made any additions or clarifications directly to the above note.This critical care time does not reflect procedure time, or teaching time or supervisory time of PA/NP/Med Resident etc but could involve care discussion time.  I spent 30 minutes of neurocritical care time  in the care of  this patient.  Delia Heady, MD  To contact Stroke Continuity provider, please refer to [email or web address]. After hours, contact General Neurology

## 2020-07-31 DIAGNOSIS — Z7189 Other specified counseling: Secondary | ICD-10-CM

## 2020-07-31 DIAGNOSIS — I6601 Occlusion and stenosis of right middle cerebral artery: Secondary | ICD-10-CM | POA: Diagnosis not present

## 2020-07-31 DIAGNOSIS — I6300 Cerebral infarction due to thrombosis of unspecified precerebral artery: Secondary | ICD-10-CM

## 2020-07-31 DIAGNOSIS — Z515 Encounter for palliative care: Secondary | ICD-10-CM

## 2020-07-31 DIAGNOSIS — J9601 Acute respiratory failure with hypoxia: Secondary | ICD-10-CM | POA: Diagnosis not present

## 2020-07-31 LAB — BASIC METABOLIC PANEL
Anion gap: 14 (ref 5–15)
BUN: 27 mg/dL — ABNORMAL HIGH (ref 8–23)
CO2: 19 mmol/L — ABNORMAL LOW (ref 22–32)
Calcium: 8.6 mg/dL — ABNORMAL LOW (ref 8.9–10.3)
Chloride: 100 mmol/L (ref 98–111)
Creatinine, Ser: 1.4 mg/dL — ABNORMAL HIGH (ref 0.44–1.00)
GFR calc Af Amer: 43 mL/min — ABNORMAL LOW (ref >60)
GFR calc non Af Amer: 37 mL/min — ABNORMAL LOW (ref >60)
Glucose, Bld: 249 mg/dL — ABNORMAL HIGH (ref 70–99)
Potassium: 5.2 mmol/L — ABNORMAL HIGH (ref 3.5–5.1)
Sodium: 133 mmol/L — ABNORMAL LOW (ref 135–145)

## 2020-07-31 LAB — CBC
HCT: 31.5 % — ABNORMAL LOW (ref 36.0–46.0)
Hemoglobin: 9.3 g/dL — ABNORMAL LOW (ref 12.0–15.0)
MCH: 21 pg — ABNORMAL LOW (ref 26.0–34.0)
MCHC: 29.5 g/dL — ABNORMAL LOW (ref 30.0–36.0)
MCV: 71.3 fL — ABNORMAL LOW (ref 80.0–100.0)
Platelets: 186 10*3/uL (ref 150–400)
RBC: 4.42 MIL/uL (ref 3.87–5.11)
RDW: 15.3 % (ref 11.5–15.5)
WBC: 20.6 10*3/uL — ABNORMAL HIGH (ref 4.0–10.5)
nRBC: 0 % (ref 0.0–0.2)

## 2020-07-31 LAB — GLUCOSE, CAPILLARY
Glucose-Capillary: 250 mg/dL — ABNORMAL HIGH (ref 70–99)
Glucose-Capillary: 173 mg/dL — ABNORMAL HIGH (ref 70–99)
Glucose-Capillary: 254 mg/dL — ABNORMAL HIGH (ref 70–99)
Glucose-Capillary: 284 mg/dL — ABNORMAL HIGH (ref 70–99)

## 2020-07-31 MED ORDER — OSMOLITE 1.2 CAL PO LIQD
1000.0000 mL | ORAL | Status: DC
  Administered 2020-07-31 – 2020-08-06 (×6): 1000 mL
  Filled 2020-07-31 (×8): qty 1000

## 2020-07-31 MED ORDER — METOPROLOL TARTRATE 50 MG PO TABS
100.0000 mg | ORAL_TABLET | Freq: Two times a day (BID) | ORAL | Status: DC
  Administered 2020-07-31 – 2020-08-05 (×12): 100 mg
  Filled 2020-07-31 (×12): qty 2

## 2020-07-31 MED ORDER — INSULIN GLARGINE 100 UNIT/ML ~~LOC~~ SOLN
10.0000 [IU] | Freq: Every day | SUBCUTANEOUS | Status: DC
  Administered 2020-07-31: 10 [IU] via SUBCUTANEOUS
  Filled 2020-07-31 (×2): qty 0.1

## 2020-07-31 MED ORDER — PROSOURCE TF PO LIQD
45.0000 mL | Freq: Every day | ORAL | Status: DC
  Administered 2020-08-01 – 2020-08-23 (×24): 45 mL
  Filled 2020-07-31 (×23): qty 45

## 2020-07-31 NOTE — Progress Notes (Signed)
SLP Cancellation Note  Patient Details Name: Samantha Paul MRN: [id number] DOB: 1946/04/05   Cancelled treatment:       Reason Eval/Treat Not Completed: Patient not medically ready. Remains intubated   Adrion Menz, Riley Nearing 07/31/2020, 9:20 AM

## 2020-07-31 NOTE — Progress Notes (Signed)
[name], MRN:  [id number], DOB:  1946/12/26, LOS: 5 ADMISSION DATE:  07/26/2020, CONSULTATION DATE:  07/26/20 REFERRING MD:  Dr. Estanislado Pandy, CHIEF COMPLAINT:  L sided weakness   Brief History   74 y/o F admitted on 8/1 with R ACA/MCA occulusion with L sided hemiparesis and aphasia. s/p Neuro IR Revascularization.   Significant Hospital Events   8/1: Admit with R MCA / ACA occlusion s/p revascularization  8/2: O/N Hypotensive Phenylephrine IV started    Consults: date of consult/date signed off & final recs:  07/26/20: PCCM Procedures (surgical and bedside):  8/1 >> ETT  8/1 >> L Radial Aline Significant Diagnostic Tests:   ECHO 07/03/20 >> LVEF 65-70%, narrow LVOT, turbulent flow through the LV/LVOT, no RWMA, mild LVH, RV systolic function normal, normal PASP.  CT Code Stroke 8/1 >> no acute CT finding, chronic small vessel ischemic changes  CTA Head / Neck 8/1 >> embolic occlusion at the right carotid terminus. Minimal flow seen in the right MCA territory. Right ACA appears normally perfused, probably due to a patent anterior communicating artery.  Core infarct right frontal operculum 16cc, large penumbra, 136 cc in the remainder of the R MCA territory. No atherosclerotic disease at either carotid bifurcation.    CT Head 8/2: >>New mass effect in the anterior right frontal lobe with regional   increasing mixed hypo- and hyperdensity. Favor a combination of   anterior right MCA territory cytotoxic edema and petechial hemorrhage. No malignant   hemorrhage.    MR Brain wo contrast 8/2>>Multifocal ischemic infarction within the R MCA Territory,   greatest at the frontal operculum and insula. Additional cortical ischemia within the R ACA   territory and R PCA territory. Large area of infarction in the R basal ganglia with petechial   hemorrhage vs contrast staining. Punctate focus of acute ischemia in L occipital lobe.   Subjective:  Samantha Paul was seen and evaluated at bedside this AM. She  followed simple commands.   Objective   Blood pressure (!) 145/73, pulse 82, temperature (!) 100.5 F (38.1 C), temperature source Axillary, resp. rate 20, height 5' 2"  (1.575 m), weight 62 kg, SpO2 100 %.    Vent Mode: PSV;CPAP FiO2 (%):  [40 %] 40 % Set Rate:  [16 bmp] 16 bmp Vt Set:  [400 mL] 400 mL PEEP:  [5 cmH20] 5 cmH20 Pressure Support:  [8 cmH20] 8 cmH20 Plateau Pressure:  [19 cmH20] 19 cmH20   Intake/Output Summary (Last 24 hours) at 07/31/2020 0859 Last data filed at 07/31/2020 0800 Gross per 24 hour  Intake 1419.86 ml  Output 850 ml  Net 569.86 ml   Filed Weights   07/29/20 0500 07/30/20 0500 07/31/20 0350  Weight: 62.3 kg 61.4 kg 62 kg    Examination: Physical Exam Eyes:     Conjunctiva/sclera: Conjunctivae normal.     Pupils: Pupils are equal, round, and reactive to light.  Cardiovascular:     Rate and Rhythm: Normal rate and regular rhythm.     Pulses: Normal pulses.     Heart sounds: Normal heart sounds. No murmur heard.  No friction rub. No gallop.   Pulmonary:     Effort: Pulmonary effort is normal.     Breath sounds: Normal breath sounds. No wheezing, rhonchi or rales.  Abdominal:     General: Abdomen is flat. Bowel sounds are normal.     Tenderness: There is no abdominal tenderness. There is no guarding.  Skin:    General:  Skin is warm.  Neurological:     Mental Status: She is alert.     Comments: intermittently following commands. Able to move RUE and RL foot sporadically. Unable to move L side.      Resolved Hospital Problem list   Hypotension  Assessment & Plan:  R ACA / MCA Occlusion / Acute CVA S/p revascularization, large penumbra noted on initial CT imaging. 8/2 MRI showed R ACA and PCA cortical ischemia, large area of infarction in the R basal ganglia with petechial hemorrhage vs contrast staining, with punctate focus of acute ischemia in L occiptial lobe.  - SBP goal 120-160 - Crestor 20 mg QD - Recommendations per Neuro  Acute  Respiratory Failure in setting of Acute CVA Requiring Mechanical Ventilation Continues to intermittently follow commands. Spoke with respiratory at bedside. Patient tolerated trach throughout the day yesterday and through to the last 4 hours of night shift. Did have increased secretions, which appear somewhat similar to her feeds, worrisome for reflux.  -Wean PEEP / FiO2 for sats > 90% -Daily SBT  -follow intermittent CXR   Atrial Fibrillation: On Cardizem metoprolol, eliquis prior to admit, CHADS-VASC 6. Patient still tachycardic with pulses ranging 105-130 during interview.  -no anticoagulation / rate control for now with risk of hemorrhagic conversion  - tele monitoring  - Diltiazem 60 mg suspension Q6H, Dilt drip stopped. - Increase Lopressor to 100 mg BID.  Microcytic Anemia: - QD CBC - Hgb 10.1<10.2  DM II with Hyperglycemia  Elevated sugars in the 180s-250s. Patient on 20U Lantus at home.  - Added 10U Lantus daily. -SSI  - A1c 8.7   Disposition / Summary of Today's Plan 07/31/20   Patient still tachycardic, will increase metoprolol for rate control of A. Fib. Family discussion today for [place] discussion to determine next steps. Palliative consulted to help with GOC and discussing long term management of Samantha Paul condition.     Nutritional status and diet: NPO Pain/Anxiety/Delirium reduction: PAD VAP protocol (if indicated) Yes DVT prophylaxis: SCDs GI prophylaxis: PPI Hyperglycemia protocol: SSI Mobility:Bed bound Code Status: FULL Family Communication: Per primary   Labs and Ancillary Testing (personally reviewed)  CBC: CBC Latest Ref Rng & Units 07/30/2020 07/29/2020 07/28/2020  WBC 4.0 - 10.5 K/uL 14.7(H) 17.8(H) 13.3(H)  Hemoglobin 12.0 - 15.0 g/dL 9.4(L) 10.1(L) 10.2(L)  Hematocrit 36 - 46 % 32.0(L) 35.2(L) 34.8(L)  Platelets 150 - 400 K/uL 166 188 209    Chemistry: CMP Latest Ref Rng & Units 07/31/2020 07/30/2020 07/29/2020  Glucose 70 - 99 mg/dL 249(H) 206(H)  196(H)  BUN 8 - 23 mg/dL 27(H) 19 22  Creatinine 0.44 - 1.00 mg/dL 1.40(H) 0.76 0.92  Sodium 135 - 145 mmol/L 133(L) 134(L) 135  Potassium 3.5 - 5.1 mmol/L 5.2(H) 4.5 4.1  Chloride 98 - 111 mmol/L 100 104 104  CO2 22 - 32 mmol/L 19(L) 21(L) 21(L)  Calcium 8.9 - 10.3 mg/dL 8.6(L) 8.1(L) 8.2(L)  Total Protein 6.5 - 8.1 g/dL - - -  Total Bilirubin 0.3 - 1.2 mg/dL - - -  Alkaline Phos 38 - 126 U/L - - -  AST 15 - 41 U/L - - -  ALT 0 - 44 U/L - - -   ABG:    Component Value Date/Time   PHART 7.373 07/26/2020 1720   PCO2ART 31.1 (L) 07/26/2020 1720   PO2ART 210 (H) 07/26/2020 1720   HCO3 17.8 (L) 07/26/2020 1720   TCO2 21 (L) 07/26/2020 1215   ACIDBASEDEF 6.6 (H) 07/26/2020  [place] 99.4 07/26/2020 1720    CBG: CBG (last 3)  Recent Labs    07/30/20 1924 07/30/20 2310 07/31/20 0743  GLUCAP 215* 237* 250*     Microbiology:  8/1>>MRSA PCR Screening: Negative  8/1>> SARS Coronavirus: Negative  Review of Systems:   N/A as patient is on mechanical ventilator  Past medical history  She,  has a past medical history of Afib ([place]) (06/2020), Back pain, CVA (cerebral vascular accident) ([place]) (07/26/2020), DM II (diabetes mellitus, type II), controlled ([place]), HTN (hypertension), Knee pain, and Stroke ([place]) (?).   Surgical History    Past Surgical History:  Procedure Laterality Date  . CHOLECYSTECTOMY N/A 06/30/2020   Procedure: LAPAROSCOPIC CHOLECYSTECTOMY;  Surgeon: Georganna Skeans, MD;  Location: [place];  Service: General;  Laterality: N/A;  . IR ANGIO VERTEBRAL SEL SUBCLAVIAN INNOMINATE UNI R MOD SED  07/29/2020  . IR PERCUTANEOUS ART THROMBECTOMY/INFUSION INTRACRANIAL INC DIAG ANGIO  07/26/2020  . RADIOLOGY WITH ANESTHESIA N/A 07/26/2020   Procedure: IR WITH ANESTHESIA;  Surgeon: Radiologist, Medication, MD;  Location: [place];  Service: Radiology;  Laterality: N/A;     Social History   Social History   Socioeconomic History  . Marital status: Married    Spouse name: Not on  file  . Number of children: Not on file  . Years of education: Not on file  . Highest education level: Not on file  Occupational History  . Not on file  Tobacco Use  . Smoking status: Never Smoker  . Smokeless tobacco: Never Used  Substance and Sexual Activity  . Alcohol use: Not Currently  . Drug use: Not Currently  . Sexual activity: Not on file  Other Topics Concern  . Not on file  Social History Narrative  . Not on file   Social Determinants of Health   Financial Resource Strain:   . Difficulty of Paying Living Expenses:   Food Insecurity:   . Worried About Charity fundraiser in the Last Year:   . Arboriculturist in the Last Year:   Transportation Needs:   . Film/video editor (Medical):   Marland Kitchen Lack of Transportation (Non-Medical):   Physical Activity:   . Days of Exercise per Week:   . Minutes of Exercise per Session:   Stress:   . Feeling of Stress :   Social Connections:   . Frequency of Communication with Friends and Family:   . Frequency of Social Gatherings with Friends and Family:   . Attends Religious Services:   . Active Member of Clubs or Organizations:   . Attends Archivist Meetings:   Marland Kitchen Marital Status:   Intimate Partner Violence:   . Fear of Current or Ex-Partner:   . Emotionally Abused:   Marland Kitchen Physically Abused:   . Sexually Abused:   ,  reports that she has never smoked. She has never used smokeless tobacco. She reports previous alcohol use. She reports previous drug use.   Family history   Her family history includes Arthritis in her brother.   Allergies Allergies  Allergen Reactions  . Metformin And Related Diarrhea    Abdominal pain and diarrhea    Home meds  Prior to Admission medications   Medication Sig Start Date End Date Taking? Authorizing Provider  acetaminophen (TYLENOL) 325 MG tablet Take 2-3 tablets (650-975 mg total) by mouth every 6 (six) hours as needed for mild pain. 07/03/20   Jill Alexanders, PA-C  apixaban  Arne [place])  5 MG TABS tablet Take 1 tablet (5 mg total) by mouth 2 (two) times daily. 07/03/20   Oswald Hillock, MD  blood glucose meter kit and supplies KIT Dispense based on patient and insurance preference. Use up to four times daily as directed. (FOR ICD-10--E11.9). 07/25/18   Love, Ivan Anchors, PA-C  diltiazem (CARTIA XT) 240 MG 24 hr capsule Take 1 capsule (240 mg total) by mouth daily. 07/03/20 07/03/21  Oswald Hillock, MD  insulin glargine (LANTUS) 100 UNIT/ML Solostar Pen Inject 20 Units into the skin daily. 07/03/20   Oswald Hillock, MD  Insulin Pen Needle (PEN NEEDLES) 32G X 4 MM MISC Use as directed with insulin pen 07/03/20   Oswald Hillock, MD  metFORMIN (GLUCOPHAGE) 500 MG tablet Take 1 tablet (500 mg total) by mouth 2 (two) times daily with a meal. 07/03/20 07/03/21  Oswald Hillock, MD  metoprolol tartrate (LOPRESSOR) 25 MG tablet Take 0.5 tablets (12.5 mg total) by mouth 2 (two) times daily. 07/03/20   Oswald Hillock, MD  oxyCODONE (OXY IR/ROXICODONE) 5 MG immediate release tablet Take 1 tablet (5 mg total) by mouth every 6 (six) hours as needed for moderate pain or severe pain (pain not releived by tylenol or ibuprofen). 07/03/20   Jill Alexanders, PA-C  rosuvastatin (CRESTOR) 20 MG tablet TAKE 1 TABLET(20 MG) BY MOUTH DAILY 12/20/18   Frann Rider, NP  senna-docusate (SENOKOT-S) 8.6-50 MG tablet Take 2 tablets by mouth at bedtime. 07/25/18   Bary Leriche, PA-C    Maudie Mercury, MD IMTS, PGY-2 Pager: [phone] 07/31/2020,8:59 AM

## 2020-07-31 NOTE — Plan of Care (Signed)
  Problem: Nutrition: Goal: Risk of aspiration will decrease Outcome: Progressing Goal: Dietary intake will improve Outcome: Progressing   Problem: Clinical Measurements: Goal: Ability to maintain clinical measurements within normal limits will improve Outcome: Progressing Goal: Will remain free from infection Outcome: Progressing Goal: Diagnostic test results will improve Outcome: Progressing Goal: Respiratory complications will improve Outcome: Progressing Goal: Cardiovascular complication will be avoided Outcome: Progressing   Problem: Nutrition: Goal: Adequate nutrition will be maintained Outcome: Progressing   Problem: Coping: Goal: Level of anxiety will decrease Outcome: Progressing   Problem: Elimination: Goal: Will not experience complications related to bowel motility Outcome: Progressing   Problem: Pain Managment: Goal: General experience of comfort will improve Outcome: Progressing

## 2020-07-31 NOTE — Progress Notes (Signed)
Nutrition Follow-up  DOCUMENTATION CODES:   Not applicable  INTERVENTION:   Tube feeding via Cortrak tube: Osmolite 1.2 at 50 ml/h (1200 ml per day) Prosource TF 45 ml daily  Provides 1480 kcal, 77 gm protein, 973 ml free water daily   NUTRITION DIAGNOSIS:   Inadequate oral intake related to inability to eat as evidenced by NPO status. Ongoing.   GOAL:   Patient will meet greater than or equal to 90% of their needs Met with TF  MONITOR:   Vent status, TF tolerance  REASON FOR ASSESSMENT:   Consult, Ventilator Enteral/tube feeding initiation and management  ASSESSMENT:   Pt with PMH of HTN, afib, DM admitted with R MCA stroke s/p tPA and thrombectomy.   Pt discussed during ICU rounds and with RN.  Meeting with Palliative today Lantus added for better cbg control.   8/4 cortrak placed; tip gastric  Patient is currently intubated on ventilator support MV: 10.6 L/min Temp (24hrs), Avg:99.6 F (37.6 C), Min:97.9 F (36.6 C), Max:100.7 F (38.2 C)  Medications reviewed and include: colace, SSI, 10 units lantus daily  Labs reviewed: A1C: 8.7 CBG's: 173-250  Diet Order:   Diet Order            Diet NPO time specified  Diet effective now                 EDUCATION NEEDS:   No education needs have been identified at this time  Skin:  Skin Assessment: Reviewed RN Assessment  Last BM:  8/5  Height:   Ht Readings from Last 1 Encounters:  07/26/20 5' 2"  (1.575 m)    Weight:   Wt Readings from Last 1 Encounters:  07/31/20 62 kg    Ideal Body Weight:  50 kg  BMI:  Body mass index is 25 kg/m.  Estimated Nutritional Needs:   Kcal:  1500  Protein:  75-90 grams  Fluid:  >1.5 L/day  Lockie Pares., RD, LDN, CNSC See AMiON for contact information

## 2020-07-31 NOTE — Progress Notes (Signed)
PT Cancellation Note  Patient Details Name: Jaidence Geisler MRN: [id number] DOB: 1946-01-07   Cancelled Treatment:    Reason Eval/Treat Not Completed: Other (comment). Per RN family going to have goals of care discussion at 3. PT will hold until after GOC discussion to determine if physical therapy POC still aligns with patient and family goals.   Arlyss Gandy 07/31/2020, 5:08 PM

## 2020-07-31 NOTE — Consult Note (Signed)
Consultation Note Date: 07/31/2020   Patient Name: Samantha Paul  DOB: [birth year]  MRN: [id number]  Age / Sex: 74 y.o., female  PCP: Samantha Reach, MD Referring Physician: Garvin Fila, MD  Reason for Consultation: Establishing goals of care  HPI/Patient Profile: 74 y.o. female  with past medical history of previous stroke, uncontrolled DM2, and atrial fibrillation on Eliquis who was admitted on 07/26/2020 with left sided weakness and expressive aphasia.  She was found to have a right sided CVA in her MCA and ACA.  She received TPA.  She underwent thrombectomy on 8/1.  She is currently intubated but weaning.  She is arousable with dense left hemiplegia and right sided gaze preference.   Clinical Assessment and Goals of Care:  I have reviewed medical records including EPIC notes, labs and imaging, received report from Samantha Paul, examined the patient and met at bedside with her husband and son  to discuss diagnosis prognosis, [place], EOL wishes, disposition and options.  They requested a follow up meeting at 3:00 pm with additional family members.  I introduced Palliative Medicine as specialized medical care for people living with serious illness. It focuses on providing relief from the symptoms and stress of a serious illness.   We discussed a brief life review of the patient. She is from [place] and lives for her family.  She has 4 sons, 1 daughter and many grand children.  She and her husband have been married for over 36 years.  Per her husband she is a very patient person and has never spoken a bad word about anyone.  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  She remained very active up until the stroke. She lives for her children and grand children.  Her husband told me he wanted her in any condition and he wanted [place] to do everything appropriate for her.   He did say that if God takes her then he will accept  that, but he would like for [place] to proceed with all measures.  He then asked if we could have a 2nd meeting later in the day and invite all of his sons in to help with decisions.  At 3:00 pm I met with Samantha Paul and his 4 sons.  We talked about her past medical history and her current health situation.  I explained that she is not able to use her left side and has required a machine to help her breathe.  She also has a tube in her mouth for artificial nutrition.    I explained that she is weaning and it will soon be time to remove the ventilator.  When that happens things may go well - she may breathe successfully, be able to eat safely and be stable.  If this is the case she will probably go to SNF rehab and then home to live with family.  Although she will likely never have use of her left side and will require 24 hour care in the home.  On the other  hand, things may not go well.  She may not be able to breath.  She may not be able to eat.  We discussed trach, peg, and long term care in a facility.  We talked about the fact that people in that condition have a very difficult life and usually don't live more than a year.  Their body breaks down.   I asked the family what we should do if she is unable to breathe on her own after extubation.    The family unanimously felt that they would want her re-intubated.  If necessary they would want trach / PEG.    I asked with Mrs. Samantha Paul would want if she could sit at the table and talk with [place] now.  We prayed together for Mrs. Samantha Paul and the family as well as the medical staff supporting her.    We agreed to take one day at a time and see where we go.  PMT will continue to follow up with the family intermittently for support as the patient progresses thru her hospitalization.   Questions and concerns were addressed.  The family was encouraged to call with questions or concerns.     Primary Decision Maker:  NEXT OF KIN husband    SUMMARY OF RECOMMENDATIONS:  Full  Scope treatment.    Re-intubation if needed.  Code Status/Advance Care Planning:  Full Code   Symptom Management:   Per primary.  Additional Recommendations (Limitations, Scope, Preferences):  Full Scope Treatment  Palliative Prophylaxis:   Aspiration  Psycho-social/Spiritual:   Desire for further Chaplaincy support: welcomed.  Family is [place].  Prognosis: unable to determine.    Discharge Planning: To Be Determined Hopefully SNF for rehab.      Primary Diagnoses: Present on Admission: . Stroke ([place]) . Stroke due to embolism ([place]) . Middle cerebral artery embolism, right   I have reviewed the medical record, interviewed the patient and family, and examined the patient. The following aspects are pertinent.  Past Medical History:  Diagnosis Date  . Afib ([place]) 06/2020   Diagnosed 06/2020, on cardizem, metoprolol, eliquis  . Back pain   . CVA (cerebral vascular accident) ([place]) 07/26/2020   R MCA/ACA CVA  . DM II (diabetes mellitus, type II), controlled ([place])   . HTN (hypertension)   . Knee pain   . Stroke [facility]) ?   residual mild right sided weakness    Social History   Socioeconomic History  . Marital status: Married    Spouse name: Not on file  . Number of children: Not on file  . Years of education: Not on file  . Highest education level: Not on file  Occupational History  . Not on file  Tobacco Use  . Smoking status: Never Smoker  . Smokeless tobacco: Never Used  Substance and Sexual Activity  . Alcohol use: Not Currently  . Drug use: Not Currently  . Sexual activity: Not on file  Other Topics Concern  . Not on file  Social History Narrative  . Not on file   Social Determinants of Health   Financial Resource Strain:   . Difficulty of Paying Living Expenses:   Food Insecurity:   . Worried About Charity fundraiser in the Last Year:   . Arboriculturist in the Last Year:   Transportation Needs:   . Film/video editor (Medical):    Marland Kitchen Lack of Transportation (Non-Medical):   Physical Activity:   . Days of Exercise per Week:   .  Minutes of Exercise per Session:   Stress:   . Feeling of Stress :   Social Connections:   . Frequency of Communication with Friends and Family:   . Frequency of Social Gatherings with Friends and Family:   . Attends Religious Services:   . Active Member of Clubs or Organizations:   . Attends Archivist Meetings:   Marland Kitchen Marital Status:    Family History  Problem Relation Age of Onset  . Arthritis Brother     Allergies  Allergen Reactions  . Metformin And Related Diarrhea    Abdominal pain and diarrhea      Vital Signs: BP (!) 154/104   Pulse (!) 122   Temp (!) 100.5 F (38.1 C) (Axillary) Comment: prn given  Resp (!) 24   Ht 5' 2"  (1.575 m)   Wt 62 kg   SpO2 100%   BMI 25.00 kg/m  Pain Scale: CPOT       SpO2: SpO2: 100 % O2 Device:SpO2: 100 % O2 Flow Rate: .     Palliative Assessment/Data: 10%     Time In: 9:00 - 10:00, 3:00 - 4:00  Time Total: 2 hours.  Visit consisted of counseling and education dealing with the complex and emotionally intense issues surrounding the need for palliative care and symptom management in the setting of serious and potentially life-threatening illness. Greater than 50%  of this time was spent counseling and coordinating care related to the above assessment and plan.  Signed by: Florentina Jenny, PA-C Palliative Medicine  Please contact Palliative Medicine Team phone at [phone] for questions and concerns.  For individual provider: See Shea Evans

## 2020-07-31 NOTE — Progress Notes (Signed)
STROKE TEAM PROGRESS NOTE   INTERVAL HISTORY Her husband , [place] language interpreter and her son remain at the bedside.  Her neurological status is unchanged and patient remains intubated for respiratory failure but is arousable and follows simple commands on the right side after translation   She continues to wean  on the ventilator and CCM MD and palliative care team plan to discuss DNR status in 1 with extubation plans prior to extubation Vitals:   07/31/20 1000 07/31/20 1100 07/31/20 1200 07/31/20 1300  BP: (!) 154/104 (!) 148/126 (!) 146/75 131/69  Pulse: (!) 122 97 (!) 120 100  Resp: (!) 24 (!) 25 (!) 23 (!) 27  Temp:   (!) 100.7 F (38.2 C)   TempSrc:   Axillary   SpO2: 100% 100% 100% 100%  Weight:      Height:       CBC:  Recent Labs  Lab 07/26/20 1211 07/26/20 1215 07/29/20 0830 07/30/20 0726  WBC 6.5   < > 17.8* 14.7*  NEUTROABS 4.1  --   --   --   HGB 10.7*   < > 10.1* 9.4*  HCT 37.7   < > 35.2* 32.0*  MCV 75.1*   < > 73.6* 73.2*  PLT 185   < > 188 166   < > = values in this interval not displayed.   Basic Metabolic Panel:  Recent Labs  Lab 07/28/20 1621 07/29/20 0419 07/29/20 0830 07/30/20 0726 07/31/20 0657  NA  --   --    < > 134* 133*  K  --   --    < > 4.5 5.2*  CL  --   --    < > 104 100  CO2  --   --    < > 21* 19*  GLUCOSE  --   --    < > 206* 249*  BUN  --   --    < > 19 27*  CREATININE  --   --    < > 0.76 1.40*  CALCIUM  --   --    < > 8.1* 8.6*  MG 2.1 2.0  --   --   --   PHOS 3.4 3.1  --   --   --    < > = values in this interval not displayed.   Lipid Panel:  Recent Labs  Lab 07/27/20 0637  CHOL 147  TRIG 79  HDL 41  CHOLHDL 3.6  VLDL 16  LDLCALC 90   Lab Results  Component Value Date   HGBA1C 8.7 (H) 07/27/2020   Urine Drug Screen:  Recent Labs  Lab 07/27/20 1254  LABOPIA NONE DETECTED  COCAINSCRNUR NONE DETECTED  LABBENZ NONE DETECTED  AMPHETMU NONE DETECTED  THCU NONE DETECTED  LABBARB NONE DETECTED     Alcohol Level  Recent Labs  Lab 07/26/20 1209  ETH <10    IMAGING past 24 hours No results found.  PHYSICAL EXAM Elderly Asian lady who is intubated sedated. Not in distress. . Afebrile. Head is nontraumatic. Neck is supple without bruit.    Cardiac exam no murmur or gallop. Lungs are clear to auscultation. Distal pulses are well felt. Neurological Exam :  Patient is intubated sedated. Eyes closed but opens with sternal rub.  She follows some simple midline and commands on the right side after translation by her husband. Right gaze preference. Unable to look to the left. Blinks to threat on the right but not on the  left. Pupils were irregular but reactive. Fundi not visualized. Left lower facial weakness. Tongue midline. Purposeful antigravity movements on the right. Left hemiplegia but will withdraw the left lower extremity to pain but not the left upper extremity. Tone is reduced on the left compared to the right. Left plantar upgoing right downgoing. Gait not tested. ASSESSMENT/PLAN [name] is a 74 y.o. female with history of atrial fibrillation, HTN, DM and stroke presenting with right gaze deviation, left-sided hemiplegia and dense receptive and expressive aphasia. She received tPA 07/26/2020 at 1259. Taken to IR for R ICA T occlusion.  Stroke:   R MCA infarct w/ petechial hemorrhage s/p tPA and IR revascularization, embolic secondary to known AF source on Eliquis  Code Stroke CT head No acute abnormality. Small vessel disease. Atrophy. ASPECTS 10.     CTA head & neck R ICA occlusion w/ min MCA flow. Some narrowing B VA and BA.   CT perfusion R frontal operculum core 16 cc, penumbra 136 R MCA territory  Cerebral angio RT ICA T occlusion with x 1 pass solitaire ->TICI 2C revascularization of RT MCA and a TICI 3 revascularization of RT ACA .  Post IR CT focal contrast stain in the RT globus pallidus. No gross mass effect or shift.   Post IR CT R insular infarct. R caudate head  and lentiform nucleus hyperdensity. No hemorrhage.   CT new R frontal lobe hyper and hypointensity (infarct, edema, petechial hemorrhage)  MRI  pending   2D Echo 07/03/2020 EF 65-70%. No source of embolus   LDL 90  HgbA1c 12.7  VTE prophylaxis - SCDs   Eliquis (apixaban) daily prior to admission, now on aspirin 81 mg. Hold antithrombotic given petechial hemorrhage noted on imaging  Therapy recommendations: Pending  Disposition:  pending   Acute Respiratory Failure  Secondary to stroke  Intubated for IR, left intubated post IR   No sedation   Hold extubation until MRI completed  CCM on board  Atrial Fibrillation w/ RVR  Home anticoagulation:  Eliquis (apixaban) daily   on diltiazem 240, metoprolol 12.5 bid  at home  Now on cardizem gtt . No AC at this time given petechial hemorrhage   Hypotension  Home meds:  None, no hx HTN . BP goal per IR x 24h following IR procedure and tPA administration.  . Currently on Phenylephrine to meet goal 100-140 x 24h  . Long-term BP goal normotensive  Hyperlipidemia  Home meds:  crestor 20, resumed in hospital  LDL 90, goal < 70  Continue statin at discharge  Diabetes type II Uncontrolled  Home meds:  lantus 20 daily, metformin 500 bid  HgbA1c 12.7, goal < 7.0  CBGs Recent Labs    07/30/20 2310 07/31/20 0743 07/31/20 1235  GLUCAP 237* 250* 173*      SSI  Dysphagia . Secondary to stroke . NPO . Speech on board   Other Stroke Risk Factors  Advanced age  Hx stroke/TIA  06/2018 left pontine infarct likely secondary to small vessel disease source. DAPT x 3 weeks.  Other Active Problems  Microcytic anemia 10.2   Leukocytosis 11.5  Hospital day # 5 Patient prognosis is guarded given significant infarcts on MRI and persistent right gaze preference and dense left hemiplegia.  Recommend trial of extubation when medically stable.  Continue aspirin 81 mg daily for her atrial fibrillation and anticoagulation  only after she is able to swallow or has a PEG tube.  I had a long discussion the patient's  husband , son, interpreter and palliative care nurse practitioner and answered questions.  Family needs to decide about one-way extubation versus continuing aggressive care and tracheostomy and PEG tube if necessary.  Family planning on having a family meeting with all her children and husband later this afternoon with palliative care team to make a final decision about her goals of care.  Discussed with Dr. Toney Reil critical care medicine.  This patient is critically ill and at significant risk of neurological worsening, death and care requires constant monitoring of vital signs, hemodynamics,respiratory and cardiac monitoring, extensive review of multiple databases, frequent neurological assessment, discussion with family, other specialists and medical decision making of high complexity.I have made any additions or clarifications directly to the above note.This critical care time does not reflect procedure time, or teaching time or supervisory time of PA/NP/Med Resident etc but could involve care discussion time.  I spent 35 minutes of neurocritical care time  in the care of  this patient.  Delia Heady, MD  To contact Stroke Continuity provider, please refer to [email or web address]. After hours, contact General Neurology

## 2020-07-31 NOTE — Progress Notes (Signed)
Inpatient Diabetes Program Recommendations  AACE/ADA: New Consensus Statement on Inpatient Glycemic Control (2015)  Target Ranges:  Prepandial:   less than 140 mg/dL      Peak postprandial:   less than 180 mg/dL (1-2 hours)      Critically ill patients:  140 - 180 mg/dL   Lab Results  Component Value Date   GLUCAP 250 (H) 07/31/2020   HGBA1C 8.7 (H) 07/27/2020    Review of Glycemic Control Results for Samantha Paul, Samantha Paul (MRN [id number]) as of 07/31/2020 11:31  Ref. Range 07/30/2020 11:11 07/30/2020 16:06 07/30/2020 19:24 07/30/2020 23:10 07/31/2020 07:43  Glucose-Capillary Latest Ref Range: 70 - 99 mg/dL 528 (H) 413 (H) 244 (H) 237 (H) 250 (H)   Diabetes history:  DM2 Outpatient Diabetes medications:  Lantus 20 QD Metformin 500 QD Current orders for Inpatient glycemic control:  Lantus 10 units (added this am) Novolog 0-15 Q4H Osmolite 1.2 @ 86ml/hr  Inpatient Diabetes Program Recommendations:     Noted Lantus 10 units given this am.   If CBG's remain elevated might consider 2 units tube feed coverage.  Stop if feeds are held or discontinued.    Will continue to follow while inpatient.  Thank you, Dulce Sellar, RN, BSN Diabetes Coordinator Inpatient Diabetes Program [phone] (team pager from 8a-5p)

## 2020-08-01 ENCOUNTER — Inpatient Hospital Stay (HOSPITAL_COMMUNITY): Payer: Medicare Other

## 2020-08-01 DIAGNOSIS — D72829 Elevated white blood cell count, unspecified: Secondary | ICD-10-CM

## 2020-08-01 DIAGNOSIS — Z01818 Encounter for other preprocedural examination: Secondary | ICD-10-CM | POA: Diagnosis not present

## 2020-08-01 DIAGNOSIS — I6300 Cerebral infarction due to thrombosis of unspecified precerebral artery: Secondary | ICD-10-CM | POA: Diagnosis not present

## 2020-08-01 DIAGNOSIS — R509 Fever, unspecified: Secondary | ICD-10-CM

## 2020-08-01 DIAGNOSIS — Z4659 Encounter for fitting and adjustment of other gastrointestinal appliance and device: Secondary | ICD-10-CM

## 2020-08-01 DIAGNOSIS — N179 Acute kidney failure, unspecified: Secondary | ICD-10-CM

## 2020-08-01 DIAGNOSIS — I6601 Occlusion and stenosis of right middle cerebral artery: Secondary | ICD-10-CM | POA: Diagnosis not present

## 2020-08-01 DIAGNOSIS — I4891 Unspecified atrial fibrillation: Secondary | ICD-10-CM

## 2020-08-01 DIAGNOSIS — Z515 Encounter for palliative care: Secondary | ICD-10-CM | POA: Diagnosis not present

## 2020-08-01 DIAGNOSIS — J9601 Acute respiratory failure with hypoxia: Secondary | ICD-10-CM | POA: Diagnosis not present

## 2020-08-01 LAB — CBC
HCT: 30 % — ABNORMAL LOW (ref 36.0–46.0)
Hemoglobin: 8.8 g/dL — ABNORMAL LOW (ref 12.0–15.0)
MCH: 20.8 pg — ABNORMAL LOW (ref 26.0–34.0)
MCHC: 29.3 g/dL — ABNORMAL LOW (ref 30.0–36.0)
MCV: 70.9 fL — ABNORMAL LOW (ref 80.0–100.0)
Platelets: 188 10*3/uL (ref 150–400)
RBC: 4.23 MIL/uL (ref 3.87–5.11)
RDW: 15.5 % (ref 11.5–15.5)
WBC: 26.7 10*3/uL — ABNORMAL HIGH (ref 4.0–10.5)
nRBC: 0.1 % (ref 0.0–0.2)

## 2020-08-01 LAB — BASIC METABOLIC PANEL
Anion gap: 9 (ref 5–15)
BUN: 28 mg/dL — ABNORMAL HIGH (ref 8–23)
CO2: 21 mmol/L — ABNORMAL LOW (ref 22–32)
Calcium: 8.1 mg/dL — ABNORMAL LOW (ref 8.9–10.3)
Chloride: 96 mmol/L — ABNORMAL LOW (ref 98–111)
Creatinine, Ser: 1.56 mg/dL — ABNORMAL HIGH (ref 0.44–1.00)
GFR calc Af Amer: 38 mL/min — ABNORMAL LOW (ref >60)
GFR calc non Af Amer: 33 mL/min — ABNORMAL LOW (ref >60)
Glucose, Bld: 294 mg/dL — ABNORMAL HIGH (ref 70–99)
Potassium: 4.4 mmol/L (ref 3.5–5.1)
Sodium: 126 mmol/L — ABNORMAL LOW (ref 135–145)

## 2020-08-01 LAB — GLUCOSE, CAPILLARY
Glucose-Capillary: 307 mg/dL — ABNORMAL HIGH (ref 70–99)
Glucose-Capillary: 247 mg/dL — ABNORMAL HIGH (ref 70–99)
Glucose-Capillary: 254 mg/dL — ABNORMAL HIGH (ref 70–99)
Glucose-Capillary: 282 mg/dL — ABNORMAL HIGH (ref 70–99)
Glucose-Capillary: 272 mg/dL — ABNORMAL HIGH (ref 70–99)
Glucose-Capillary: 203 mg/dL — ABNORMAL HIGH (ref 70–99)

## 2020-08-01 LAB — NA AND K (SODIUM & POTASSIUM), RAND UR
Potassium Urine: 42 mmol/L
Sodium, Ur: 86 mmol/L

## 2020-08-01 MED ORDER — SODIUM CHLORIDE 0.9 % IV BOLUS
500.0000 mL | Freq: Once | INTRAVENOUS | Status: AC
  Administered 2020-08-01: 500 mL via INTRAVENOUS

## 2020-08-01 MED ORDER — PIPERACILLIN-TAZOBACTAM 3.375 G IVPB
3.3750 g | Freq: Three times a day (TID) | INTRAVENOUS | Status: DC
  Administered 2020-08-01 – 2020-08-03 (×7): 3.375 g via INTRAVENOUS
  Filled 2020-08-01 (×7): qty 50

## 2020-08-01 MED ORDER — INSULIN GLARGINE 100 UNIT/ML ~~LOC~~ SOLN
5.0000 [IU] | Freq: Once | SUBCUTANEOUS | Status: AC
  Administered 2020-08-01: 5 [IU] via SUBCUTANEOUS
  Filled 2020-08-01: qty 0.05

## 2020-08-01 MED ORDER — INSULIN GLARGINE 100 UNIT/ML ~~LOC~~ SOLN
20.0000 [IU] | Freq: Every day | SUBCUTANEOUS | Status: DC
  Administered 2020-08-02 – 2020-08-05 (×4): 20 [IU] via SUBCUTANEOUS
  Filled 2020-08-01 (×4): qty 0.2

## 2020-08-01 MED ORDER — INSULIN ASPART 100 UNIT/ML ~~LOC~~ SOLN
5.0000 [IU] | SUBCUTANEOUS | Status: DC
  Administered 2020-08-01 – 2020-08-17 (×88): 5 [IU] via SUBCUTANEOUS

## 2020-08-01 MED ORDER — INSULIN ASPART 100 UNIT/ML ~~LOC~~ SOLN
3.0000 [IU] | SUBCUTANEOUS | Status: DC
  Administered 2020-08-01: 3 [IU] via SUBCUTANEOUS

## 2020-08-01 MED ORDER — HEPARIN SODIUM (PORCINE) 5000 UNIT/ML IJ SOLN
5000.0000 [IU] | Freq: Three times a day (TID) | INTRAMUSCULAR | Status: DC
  Administered 2020-08-01 – 2020-08-13 (×37): 5000 [IU] via SUBCUTANEOUS
  Filled 2020-08-01 (×37): qty 1

## 2020-08-01 MED ORDER — INSULIN GLARGINE 100 UNIT/ML ~~LOC~~ SOLN
15.0000 [IU] | Freq: Every day | SUBCUTANEOUS | Status: DC
  Administered 2020-08-01: 15 [IU] via SUBCUTANEOUS
  Filled 2020-08-01: qty 0.15

## 2020-08-01 NOTE — Progress Notes (Signed)
STROKE TEAM PROGRESS NOTE   INTERVAL HISTORY Husband and RN are at bedside. Pt still intubated on weaning protocol. However, pt seems more lethargic than yesterday, continued to have fever with Tmax 102.3. continue to have worsening leukocytosis and AKI and hyperglycemia. CXR no acute abnormalities. Currently on IVF and TF, will add regular insulin Q4h, check UA and repeat CT head. Will start empiric Abx.    Vitals:   08/01/20 0400 08/01/20 0500 08/01/20 0528 08/01/20 0600  BP: 132/79 137/64  140/86  Pulse: 91 92  (!) 107  Resp: (!) 23 (!) 23  (!) 30  Temp: 100 F (37.8 C)   98.8 F (37.1 C)  TempSrc: Oral   Axillary  SpO2: 100% 100% 100% 100%  Weight:      Height:       CBC:  Recent Labs  Lab 07/26/20 1211 07/26/20 1215 07/30/20 0726 07/31/20 1325  WBC 6.5   < > 14.7* 20.6*  NEUTROABS 4.1  --   --   --   HGB 10.7*   < > 9.4* 9.3*  HCT 37.7   < > 32.0* 31.5*  MCV 75.1*   < > 73.2* 71.3*  PLT 185   < > 166 186   < > = values in this interval not displayed.   Basic Metabolic Panel:  Recent Labs  Lab 07/28/20 1621 07/29/20 0419 07/29/20 0830 07/30/20 0726 07/31/20 0657  NA  --   --    < > 134* 133*  K  --   --    < > 4.5 5.2*  CL  --   --    < > 104 100  CO2  --   --    < > 21* 19*  GLUCOSE  --   --    < > 206* 249*  BUN  --   --    < > 19 27*  CREATININE  --   --    < > 0.76 1.40*  CALCIUM  --   --    < > 8.1* 8.6*  MG 2.1 2.0  --   --   --   PHOS 3.4 3.1  --   --   --    < > = values in this interval not displayed.   Lipid Panel:  Recent Labs  Lab 07/27/20 0637  CHOL 147  TRIG 79  HDL 41  CHOLHDL 3.6  VLDL 16  LDLCALC 90   Lab Results  Component Value Date   HGBA1C 8.7 (H) 07/27/2020   Urine Drug Screen:  Recent Labs  Lab 07/27/20 1254  LABOPIA NONE DETECTED  COCAINSCRNUR NONE DETECTED  LABBENZ NONE DETECTED  AMPHETMU NONE DETECTED  THCU NONE DETECTED  LABBARB NONE DETECTED    Alcohol Level  Recent Labs  Lab 07/26/20 1209  ETH <10     IMAGING past 24 hours No results found.  PHYSICAL EXAM   Temp:  [98.8 F (37.1 C)-102.3 F (39.1 C)] 101.2 F (38.4 C) (08/07 0800) Pulse Rate:  [56-120] 106 (08/07 1100) Resp:  [13-30] 26 (08/07 1100) BP: (107-152)/(60-99) 142/81 (08/07 1100) SpO2:  [100 %] 100 % (08/07 1100) FiO2 (%):  [40 %] 40 % (08/07 0745)  General - Well nourished, well developed, intubated off sedation.  Ophthalmologic - fundi not visualized due to noncooperation.  Cardiovascular - irregularly irregular heart rate and rhythm.  Neuro - intubated not on sedation, intermittent agitation as per RN. eyes closed, resist for eye forced opening, not  following commands. With forced eye opening, eyes in right gaze position, not able to cross midline. Not blinking to visual threat on the left but blinking on the right, doll's eyes present but not cross midline, not tracking, PERRL. Corneal reflex present, gag and cough present. Breathing over the vent.  Facial symmetry not able to test due to ET tube.  Tongue protrusion not cooperative. On pain stimulation, strong active movement of RUE and RLE with RUE at least 4/5 and RLE at least 3-/5. LUE 1/5 and LLE 1/5 on pain. DTR 1+ and bilateral positive babinski. Sensation not cooperative but brisk response to painful stimulation, coordination not cooperative and gait not tested.    ASSESSMENT/PLAN [name] is a 74 y.o. female with history of atrial fibrillation, HTN, DM and stroke presenting with right gaze deviation, left-sided hemiplegia and dense receptive and expressive aphasia. She received tPA 07/26/2020 at 1259. Taken to IR for R ICA T occlusion.  Stroke:   R MCA and ACA infarct due to R tICA occlusion s/p tPA and IR w/ TICI2c-3, embolic secondary to known AF but not on [facility] for a week  Code Stroke CT head No acute abnormality.     CTA head & neck R tICA occlusion w/ min MCA flow. Some narrowing B VA and BA.   CT perfusion R frontal operculum core 16 cc,  penumbra 136 R MCA territory  Cerebral angio RT ICA T occlusion ->TICI 2C revascularization of RT MCA and a TICI 3 revascularization of RT ACA .  CT head post IR - new R frontal lobe hyper and hypointensity (infarct, edema, petechial hemorrhage)  MRI right MCA and ACA multifocal large infarcts with petechial hemo  2D Echo 07/03/2020 EF 65-70%. No source of embolus   LDL 90  HgbA1c 12.7  VTE prophylaxis - heparin subq   Eliquis (apixaban) daily prior to admission, now on aspirin 81 mg. Hold AC for now given petechial hemorrhage, large infarct and potential procedure.  Therapy recommendations: Pending  Disposition:  Pending - palliative care on board, family decided on possible extubate if able, but would like re-intubate if not tolerating and OK with trach if needed.   Acute Respiratory Failure  Secondary to stroke  Intubated for IR, left intubated post IR   off sedation   Still on vent, weaning protocol   Not candidate for extubation today  CCM on board  Atrial Fibrillation w/ RVR  Home anticoagulation:  Eliquis (apixaban) daily - not on for at least a week  on diltiazem 240, metoprolol 12.5 bid  PTA  Now on cardizem and metoprolol   Rate controlled . No AC at this time given petechial hemorrhage, large infarct and potential procedure   Hypotension, resolved  Home meds:  None, no hx HTN . BP goal < 160 now . BP stable  . Long-term BP goal normotensive  Hyperlipidemia  Home meds:  crestor 20, resumed in hospital  LDL 90, goal < 70  Continue statin at discharge  Diabetes type II Uncontrolled  Home meds:  lantus 20 daily, metformin 500 bid  HgbA1c 12.7, goal < 7.0  CBGs  SSI  Continued hyperglycemia  DM coordinator on board  Now on lantus 20, novolog 5U Q4h  Dysphagia . Secondary to stroke . NPO . Has cortrak . On TF @50  . On IVF @ 50 . Speech on board   Fever, leukocytosis  Tmax 102.3->101.2  CXR unremarkable  UA pending  B Cx  pending  WBC 14.7->20.6->26.7  Put on empiric zosyn 8/7>>  AKI  creatinine - 0.76->1.40->1.56  On TF @ 50  On IVF @ 50  Continue BMP monitoring  Other Stroke Risk Factors  Advanced age  Hx stroke/TIA  06/2018 left pontine infarct likely secondary to small vessel disease source. DAPT x 3 weeks.  Other Active Problems  Microcytic anemia 10.2->9.4->9.3 stable  Code status - Full code  Hospital day # 6  This patient is critically ill due to right ICA occlusion, right MCA and ACA infarct, hemorrhagic infarct, high grade fever, leukocytosis, AKI, respiratory failure needing ventilation, afib RVR, hypotension and at significant risk of neurological worsening, death form recurrent stroke, cerebral edema, hemorrhagic conversion, heart failure, sepsis, septic shock, seizure. This patient's care requires constant monitoring of vital signs, hemodynamics, respiratory and cardiac monitoring, review of multiple databases, neurological assessment, discussion with family, other specialists and medical decision making of high complexity. I spent 40 minutes of neurocritical care time in the care of this patient. I had long discussion with husbnad at bedside, updated pt current condition, treatment plan and potential prognosis, and answered all the questions. He expressed understanding and appreciation. I also discussed with Dr. Juanetta Gosling.    Marvel Plan, MD PhD Stroke Neurology 08/02/2020 3:14 AM   To contact Stroke Continuity provider, please refer to [email or web address]. After hours, contact General Neurology

## 2020-08-01 NOTE — Progress Notes (Signed)
NAMEKilie Paul, MRN:  [id number], DOB:  1946-07-23, LOS: 6 ADMISSION DATE:  07/26/2020, CONSULTATION DATE:  07/26/20 REFERRING MD:  Dr. Estanislado Pandy, CHIEF COMPLAINT:  L sided weakness   Brief History   74 y/o F admitted on 8/1 with R ACA/MCA occulusion with L sided hemiparesis and aphasia. s/p Neuro IR Revascularization.   Significant Hospital Events   8/1: Admit with R MCA / ACA occlusion s/p revascularization  8/2: O/N Hypotensive Phenylephrine IV started    Consults: date of consult/date signed off & final recs:  07/26/20: PCCM Procedures (surgical and bedside):  8/1 >> ETT  8/1 >> L Radial Aline Significant Diagnostic Tests:   ECHO 07/03/20 >> LVEF 65-70%, narrow LVOT, turbulent flow through the LV/LVOT, no RWMA, mild LVH, RV systolic function normal, normal PASP.  CT Code Stroke 8/1 >> no acute CT finding, chronic small vessel ischemic changes  CTA Head / Neck 8/1 >> embolic occlusion at the right carotid terminus. Minimal flow seen in the right MCA territory. Right ACA appears normally perfused, probably due to a patent anterior communicating artery.  Core infarct right frontal operculum 16cc, large penumbra, 136 cc in the remainder of the R MCA territory. No atherosclerotic disease at either carotid bifurcation.    CT Head 8/2: >>New mass effect in the anterior right frontal lobe with regional   increasing mixed hypo- and hyperdensity. Favor a combination of   anterior right MCA territory cytotoxic edema and petechial hemorrhage. No malignant   hemorrhage.    MR Brain wo contrast 8/2>>Multifocal ischemic infarction within the R MCA Territory,   greatest at the frontal operculum and insula. Additional cortical ischemia within the R ACA   territory and R PCA territory. Large area of infarction in the R basal ganglia with petechial   hemorrhage vs contrast staining. Punctate focus of acute ischemia in L occipital lobe.   Subjective:  Samantha Paul was seen and evaluated at bedside this AM. She  followed simple commands. Spoke with husband at bedside this morning. Given how GOC with palliative went yesterday, will continue SBTs to see when appropriate extubation would be achieved. If patient fails will be re-intubated. Husband voiced agreement.   Objective   Blood pressure 131/64, pulse 90, temperature 98.8 F (37.1 C), temperature source Axillary, resp. rate (!) 24, height 5' 2"  (1.575 m), weight 62 kg, SpO2 100 %.    Vent Mode: PSV FiO2 (%):  [40 %] 40 % Set Rate:  [16 bmp] 16 bmp Vt Set:  [400 mL] 400 mL PEEP:  [5 cmH20] 5 cmH20 Pressure Support:  [8 cmH20-10 cmH20] 10 cmH20 Plateau Pressure:  [17 cmH20-18 cmH20] 17 cmH20   Intake/Output Summary (Last 24 hours) at 08/01/2020 0734 Last data filed at 08/01/2020 0700 Gross per 24 hour  Intake 1004.8 ml  Output 1190 ml  Net -185.2 ml   Filed Weights   07/29/20 0500 07/30/20 0500 07/31/20 0350  Weight: 62.3 kg 61.4 kg 62 kg    Examination: Physical Exam Constitutional:      Comments: Alert, does not open eyes to command, but able to follow commands on R side this am.   Eyes:     Pupils: Pupils are equal, round, and reactive to light.  Pulmonary:     Effort: Pulmonary effort is normal.     Breath sounds: Normal breath sounds. No wheezing, rhonchi or rales.  Abdominal:     General: Abdomen is flat. Bowel sounds are normal.  Neurological:  Comments: Able to follow commands on R side.      Resolved Hospital Problem list   Hypotension  Assessment & Plan:  R ACA / MCA Occlusion / Acute CVA S/p revascularization, large penumbra noted on initial CT imaging. 8/2 MRI showed R ACA and PCA cortical ischemia, large area of infarction in the R basal ganglia with petechial hemorrhage vs contrast staining, with punctate focus of acute ischemia in L occiptial lobe.  - SBP goal 120-160 - Crestor 20 mg QD - Recommendations per Neuro  Acute Respiratory Failure in setting of Acute CVA Requiring Mechanical Ventilation Continues  to intermittently follow commands. Spoke with respiratory at bedside. Patient tolerated trach throughout the day yesterday and through to the last 4 hours of night shift.  -Wean PEEP / FiO2 for sats > 90% -Daily SBT  -follow intermittent CXR   Atrial Fibrillation: On Cardizem metoprolol, eliquis prior to admit, CHADS-VASC 6. Patient still tachycardic with pulses ranging 70s-111 ON. Pressures 107-166 SBP. - no anticoagulation / rate control for now with risk of hemorrhagic conversion  - tele monitoring  - Diltiazem 60 mg suspension Q6H - Lopressor to 100 mg BID.  Microcytic Anemia: - QD CBC  DM II with Hyperglycemia  Elevated sugars in the 173-307. Patient on 20U Lantus at home.  - Increased to 15U Lantus daily. -SSI  - A1c 8.7   Disposition / Summary of Today's Plan 08/01/20   Continue SBT, if passes extubate today. With wishes of the family following their [place] discussion with palliative yesterday, will re-intubate and pursue trach and PEG placement.     Nutritional status and diet: NPO Pain/Anxiety/Delirium reduction: PAD VAP protocol (if indicated) Yes DVT prophylaxis: SCDs GI prophylaxis: PPI Hyperglycemia protocol: SSI Mobility:Bed bound Code Status: FULL Family Communication: Per primary   Labs and Ancillary Testing (personally reviewed)  CBC: CBC Latest Ref Rng & Units 07/31/2020 07/30/2020 07/29/2020  WBC 4.0 - 10.5 K/uL 20.6(H) 14.7(H) 17.8(H)  Hemoglobin 12.0 - 15.0 g/dL 9.3(L) 9.4(L) 10.1(L)  Hematocrit 36 - 46 % 31.5(L) 32.0(L) 35.2(L)  Platelets 150 - 400 K/uL 186 166 188    Chemistry: CMP Latest Ref Rng & Units 07/31/2020 07/30/2020 07/29/2020  Glucose 70 - 99 mg/dL 249(H) 206(H) 196(H)  BUN 8 - 23 mg/dL 27(H) 19 22  Creatinine 0.44 - 1.00 mg/dL 1.40(H) 0.76 0.92  Sodium 135 - 145 mmol/L 133(L) 134(L) 135  Potassium 3.5 - 5.1 mmol/L 5.2(H) 4.5 4.1  Chloride 98 - 111 mmol/L 100 104 104  CO2 22 - 32 mmol/L 19(L) 21(L) 21(L)  Calcium 8.9 - 10.3 mg/dL 8.6(L)  8.1(L) 8.2(L)  Total Protein 6.5 - 8.1 g/dL - - -  Total Bilirubin 0.3 - 1.2 mg/dL - - -  Alkaline Phos 38 - 126 U/L - - -  AST 15 - 41 U/L - - -  ALT 0 - 44 U/L - - -   ABG:    Component Value Date/Time   PHART 7.373 07/26/2020 1720   PCO2ART 31.1 (L) 07/26/2020 1720   PO2ART 210 (H) 07/26/2020 1720   HCO3 17.8 (L) 07/26/2020 1720   TCO2 21 (L) 07/26/2020 1215   ACIDBASEDEF 6.6 (H) 07/26/2020 1720   O2SAT 99.4 07/26/2020 1720    CBG: CBG (last 3)  Recent Labs    07/31/20 1620 07/31/20 2306 08/01/20 0323  GLUCAP 254* 284* 307*     Microbiology:  8/1>>MRSA PCR Screening: Negative  8/1>> SARS Coronavirus: Negative  Review of Systems:   N/A as patient is  on mechanical ventilator  Past medical history  She,  has a past medical history of Afib ([place]) (06/2020), Back pain, CVA (cerebral vascular accident) ([place]) (07/26/2020), DM II (diabetes mellitus, type II), controlled ([place]), HTN (hypertension), Knee pain, and Stroke ([place]) (?).   Surgical History    Past Surgical History:  Procedure Laterality Date   CHOLECYSTECTOMY N/A 06/30/2020   Procedure: LAPAROSCOPIC CHOLECYSTECTOMY;  Surgeon: Georganna Skeans, MD;  Location: [facility];  Service: General;  Laterality: N/A;   IR ANGIO VERTEBRAL SEL SUBCLAVIAN INNOMINATE UNI R MOD SED  07/29/2020   IR PERCUTANEOUS ART THROMBECTOMY/INFUSION INTRACRANIAL INC DIAG ANGIO  07/26/2020   RADIOLOGY WITH ANESTHESIA N/A 07/26/2020   Procedure: IR WITH ANESTHESIA;  Surgeon: Radiologist, Medication, MD;  Location: [facility];  Service: Radiology;  Laterality: N/A;     Social History   Social History   Socioeconomic History   Marital status: Married    Spouse name: Not on file   Number of children: Not on file   Years of education: Not on file   Highest education level: Not on file  Occupational History   Not on file  Tobacco Use   Smoking status: Never Smoker   Smokeless tobacco: Never Used  Substance and Sexual Activity   Alcohol use:  Not Currently   Drug use: Not Currently   Sexual activity: Not on file  Other Topics Concern   Not on file  Social History Narrative   Not on file   Social Determinants of Health   Financial Resource Strain:    Difficulty of Paying Living Expenses:   Food Insecurity:    Worried About Charity fundraiser in the Last Year:    Arboriculturist in the Last Year:   Transportation Needs:    Film/video editor (Medical):    Lack of Transportation (Non-Medical):   Physical Activity:    Days of Exercise per Week:    Minutes of Exercise per Session:   Stress:    Feeling of Stress :   Social Connections:    Frequency of Communication with Friends and Family:    Frequency of Social Gatherings with Friends and Family:    Attends Religious Services:    Active Member of Clubs or Organizations:    Attends Archivist Meetings:    Marital Status:   Intimate Partner Violence:    Fear of Current or Ex-Partner:    Emotionally Abused:    Physically Abused:    Sexually Abused:   ,  reports that she has never smoked. She has never used smokeless tobacco. She reports previous alcohol use. She reports previous drug use.   Family history   Her family history includes Arthritis in her brother.   Allergies Allergies  Allergen Reactions   Metformin And Related Diarrhea    Abdominal pain and diarrhea    Home meds  Prior to Admission medications   Medication Sig Start Date End Date Taking? Authorizing Provider  acetaminophen (TYLENOL) 325 MG tablet Take 2-3 tablets (650-975 mg total) by mouth every 6 (six) hours as needed for mild pain. 07/03/20   Jill Alexanders, PA-C  apixaban (ELIQUIS) 5 MG TABS tablet Take 1 tablet (5 mg total) by mouth 2 (two) times daily. 07/03/20   Oswald Hillock, MD  blood glucose meter kit and supplies KIT Dispense based on patient and insurance preference. Use up to four times daily as directed. (FOR ICD-10--E11.9). 07/25/18   Bary Leriche, PA-C  diltiazem (CARTIA XT) 240 MG 24 hr capsule Take 1 capsule (240 mg total) by mouth daily. 07/03/20 07/03/21  Oswald Hillock, MD  insulin glargine (LANTUS) 100 UNIT/ML Solostar Pen Inject 20 Units into the skin daily. 07/03/20   Oswald Hillock, MD  Insulin Pen Needle (PEN NEEDLES) 32G X 4 MM MISC Use as directed with insulin pen 07/03/20   Oswald Hillock, MD  metFORMIN (GLUCOPHAGE) 500 MG tablet Take 1 tablet (500 mg total) by mouth 2 (two) times daily with a meal. 07/03/20 07/03/21  Oswald Hillock, MD  metoprolol tartrate (LOPRESSOR) 25 MG tablet Take 0.5 tablets (12.5 mg total) by mouth 2 (two) times daily. 07/03/20   Oswald Hillock, MD  oxyCODONE (OXY IR/ROXICODONE) 5 MG immediate release tablet Take 1 tablet (5 mg total) by mouth every 6 (six) hours as needed for moderate pain or severe pain (pain not releived by tylenol or ibuprofen). 07/03/20   Jill Alexanders, PA-C  rosuvastatin (CRESTOR) 20 MG tablet TAKE 1 TABLET(20 MG) BY MOUTH DAILY 12/20/18   Frann Rider, NP  senna-docusate (SENOKOT-S) 8.6-50 MG tablet Take 2 tablets by mouth at bedtime. 07/25/18   Bary Leriche, PA-C    Maudie Mercury, MD IMTS, PGY-2 Pager: [phone] 08/01/2020,7:34 AM

## 2020-08-01 NOTE — Progress Notes (Signed)
Daily Progress Note   Patient Name: Samantha Paul       Date: 08/01/2020 DOB: 1946-11-13  Age: 74 y.o. MRN#: [id number] Attending Physician: Marvel Plan, MD Primary Care Physician: Rometta Emery, MD Admit Date: 07/26/2020  Reason for Consultation/Follow-up:  To discuss complex medical decision making related to patient's goals of care Discussed with CCM RN Janelle Floor  Subjective: Visited with patient this morning and this afternoon.  Son is present at bedside with the interpretor this afternoon.  I expressed to son that we are concerned about his mother because she has a fever today and  The lab work that monitors her kidneys and blood cells is not as good today as it was yesterday.  She will have a CT done of her head today to see if there are any changes.  Son acknowledges this and  Expresses that yesterday she would lift her arm and respond but today she does not.     I attempted to assure him that we will continue to do everything we possibly can to improve her condition.  For now he just needs to give it time - wait and see.  I asked Janelle Floor to please call me if patient's husband has any questions.  I did greet him this morning.  He also acknowledged that she was not as responsive today.   Assessment: 74 yo female who has suffered CVA of right MCA and ACA.  Weaning from ventilator, but with fever and bump in creatinine and WBC.  Not sedated.  Less responsive.   Patient Profile/HPI:  74 y.o. female  with past medical history of previous stroke, uncontrolled DM2, and atrial fibrillation on Eliquis who was admitted on 07/26/2020 with left sided weakness and expressive aphasia.  She was found to have a right sided CVA in her MCA and ACA.  She received TPA.  She underwent thrombectomy on 8/1.  She is  currently intubated but weaning.  She is arousable with dense left hemiplegia and right sided gaze preference.     Length of Stay: 6   Vital Signs: BP 126/71 (BP Location: Left Arm)   Pulse 90   Temp (!) 101.3 F (38.5 C) (Axillary) Comment: Nurse notified  Resp (!) 27   Ht 5\' 2"  (1.575 m)   Wt 62 kg   SpO2 100%  BMI 25.00 kg/m  SpO2: SpO2: 100 % O2 Device: O2 Device: Ventilator O2 Flow Rate:         Palliative Assessment/Data: 10%     Palliative Care Plan    Recommendations/Plan: Family is very involved and very appropriately supportive.  Their preference at this point is for full code full scope.  I am hopeful that if she continues to decline despite all of our efforts that they would be open to changing their goals of care.  Code Status:  Full code  Prognosis: She is at high risk of acute decline or even death given her second major CVA, currently intubated with WBC rising.  Not sedated but less interactive.  Discharge Planning: To Be Determined  Care plan was discussed with CCM RN  Thank you for allowing the Palliative Medicine Team to assist in the care of this patient.  Total time spent:  35 min.     Greater than 50%  of this time was spent counseling and coordinating care related to the above assessment and plan.  Norvel Richards, PA-C Palliative Medicine  Please contact Palliative MedicineTeam phone at [phone] for questions and concerns between 7 am - 7 pm.   Please see AMION for individual provider pager numbers.

## 2020-08-01 NOTE — Progress Notes (Signed)
Pharmacy Antibiotic Note  Samantha Paul is a 74 y.o. female admitted on 07/26/2020 with acute stroke.  Pharmacy has been consulted for Zosyn dosing for aspiration pneumonia. Tm 102.3, WBC elevated at 26.7. AKI noted, SCr up to 1.56 (baseline 0.7-0.9s).   Plan: Zosyn 3.375g IV q8h (4 hour infusion).  Monitor renal function and adjust dose if worsens Monitor clinical progress  Height: 5\' 2"  (157.5 cm) Weight: 62 kg (136 lb 11 oz) IBW/kg (Calculated) : 50.1  Temp (24hrs), Avg:100.5 F (38.1 C), Min:98.8 F (37.1 C), Max:102.3 F (39.1 C)  Recent Labs  Lab 07/28/20 0452 07/29/20 0830 07/30/20 0726 07/31/20 0657 07/31/20 1325 08/01/20 0907  WBC 13.3* 17.8* 14.7*  --  20.6* 26.7*  CREATININE 0.95 0.92 0.76 1.40*  --  1.56*    Estimated Creatinine Clearance: 28.3 mL/min (A) (by C-G formula based on SCr of 1.56 mg/dL (H)).    Allergies  Allergen Reactions   Metformin And Related Diarrhea    Abdominal pain and diarrhea    Antimicrobials this admission: Zosyn 8/7 >>   Dose adjustments this admission:   Microbiology results: 8/1 MRSA PCR: neg 8/1 COVID neg  Thank you for involving pharmacy in this patient's care.  10/1, PharmD, BCPS Clinical Pharmacist Clinical phone for 08/01/2020 until 3p is 10/01/2020 08/01/2020 11:34 AM  **Pharmacist phone directory can be found on [email or web address] listed under [facility]**

## 2020-08-01 NOTE — Progress Notes (Signed)
RT NOTES: Transported to CT and back to room 4N20 on vent without complications

## 2020-08-01 NOTE — Progress Notes (Signed)
Inpatient Diabetes Program Recommendations  AACE/ADA: New Consensus Statement on Inpatient Glycemic Control  Target Ranges:  Prepandial:   less than 140 mg/dL      Peak postprandial:   less than 180 mg/dL (1-2 hours)      Critically ill patients:  140 - 180 mg/dL   Results for Samantha Paul, Samantha Paul (MRN [id number]) as of 08/01/2020 11:57  Ref. Range 07/31/2020 07:43 07/31/2020 12:35 07/31/2020 16:20 07/31/2020 23:06 08/01/2020 03:23 08/01/2020 07:37 08/01/2020 11:38  Glucose-Capillary Latest Ref Range: 70 - 99 mg/dL 235 (H) 361 (H) 443 (H) 284 (H) 307 (H) 247 (H) 254 (H)   Review of Glycemic Control  Diabetes history: DM2 Outpatient Diabetes medications: Lantus 20 units daily, Metformin 500 mg daily Current orders for Inpatient glycemic control: Lantus 15 units daily, Novolog 3 units Q4H for tube feeding, Novolog 0-15 units Q4H; Osmolite @ 50 ml/hr  Inpatient Diabetes Program Recommendations:    Insulin: Please consider increasing Lantus to 20 units daily and tube feeding coverage to Novolog 5 units Q4H.  NOTE: Noted consult for Diabetes Coordinator. Diabetes Coordinator is not on campus over the weekend but available by pager from 8am to 5pm for questions or concerns. Chart reviewed.  Thanks, Orlando Penner, RN, MSN, CDE Diabetes Coordinator Inpatient Diabetes Program [phone] (Team Pager from 8am to 5pm)

## 2020-08-02 DIAGNOSIS — E871 Hypo-osmolality and hyponatremia: Secondary | ICD-10-CM

## 2020-08-02 DIAGNOSIS — J9601 Acute respiratory failure with hypoxia: Secondary | ICD-10-CM | POA: Diagnosis not present

## 2020-08-02 DIAGNOSIS — Z515 Encounter for palliative care: Secondary | ICD-10-CM | POA: Diagnosis not present

## 2020-08-02 DIAGNOSIS — I6300 Cerebral infarction due to thrombosis of unspecified precerebral artery: Secondary | ICD-10-CM | POA: Diagnosis not present

## 2020-08-02 DIAGNOSIS — R509 Fever, unspecified: Secondary | ICD-10-CM | POA: Diagnosis not present

## 2020-08-02 DIAGNOSIS — Z8673 Personal history of transient ischemic attack (TIA), and cerebral infarction without residual deficits: Secondary | ICD-10-CM

## 2020-08-02 DIAGNOSIS — Z4659 Encounter for fitting and adjustment of other gastrointestinal appliance and device: Secondary | ICD-10-CM | POA: Diagnosis not present

## 2020-08-02 LAB — BASIC METABOLIC PANEL
Anion gap: 13 (ref 5–15)
Anion gap: 11 (ref 5–15)
BUN: 29 mg/dL — ABNORMAL HIGH (ref 8–23)
BUN: 32 mg/dL — ABNORMAL HIGH (ref 8–23)
CO2: 17 mmol/L — ABNORMAL LOW (ref 22–32)
CO2: 17 mmol/L — ABNORMAL LOW (ref 22–32)
Calcium: 8 mg/dL — ABNORMAL LOW (ref 8.9–10.3)
Calcium: 7.9 mg/dL — ABNORMAL LOW (ref 8.9–10.3)
Chloride: 98 mmol/L (ref 98–111)
Chloride: 101 mmol/L (ref 98–111)
Creatinine, Ser: 1.54 mg/dL — ABNORMAL HIGH (ref 0.44–1.00)
Creatinine, Ser: 1.7 mg/dL — ABNORMAL HIGH (ref 0.44–1.00)
GFR calc Af Amer: 39 mL/min — ABNORMAL LOW (ref >60)
GFR calc Af Amer: 34 mL/min — ABNORMAL LOW (ref >60)
GFR calc non Af Amer: 33 mL/min — ABNORMAL LOW (ref >60)
GFR calc non Af Amer: 30 mL/min — ABNORMAL LOW (ref >60)
Glucose, Bld: 206 mg/dL — ABNORMAL HIGH (ref 70–99)
Glucose, Bld: 196 mg/dL — ABNORMAL HIGH (ref 70–99)
Potassium: 4.4 mmol/L (ref 3.5–5.1)
Potassium: 4.4 mmol/L (ref 3.5–5.1)
Sodium: 128 mmol/L — ABNORMAL LOW (ref 135–145)
Sodium: 129 mmol/L — ABNORMAL LOW (ref 135–145)

## 2020-08-02 LAB — URINALYSIS, COMPLETE (UACMP) WITH MICROSCOPIC
Bilirubin Urine: NEGATIVE
Glucose, UA: NEGATIVE mg/dL
Ketones, ur: NEGATIVE mg/dL
Nitrite: NEGATIVE
Protein, ur: 100 mg/dL — AB
Specific Gravity, Urine: 1.025 (ref 1.005–1.030)
pH: 5 (ref 5.0–8.0)

## 2020-08-02 LAB — CBC
HCT: 28.9 % — ABNORMAL LOW (ref 36.0–46.0)
Hemoglobin: 8.5 g/dL — ABNORMAL LOW (ref 12.0–15.0)
MCH: 20.7 pg — ABNORMAL LOW (ref 26.0–34.0)
MCHC: 29.4 g/dL — ABNORMAL LOW (ref 30.0–36.0)
MCV: 70.5 fL — ABNORMAL LOW (ref 80.0–100.0)
Platelets: 194 10*3/uL (ref 150–400)
RBC: 4.1 MIL/uL (ref 3.87–5.11)
RDW: 15.7 % — ABNORMAL HIGH (ref 11.5–15.5)
WBC: 22.8 10*3/uL — ABNORMAL HIGH (ref 4.0–10.5)
nRBC: 0.2 % (ref 0.0–0.2)

## 2020-08-02 LAB — GLUCOSE, CAPILLARY
Glucose-Capillary: 158 mg/dL — ABNORMAL HIGH (ref 70–99)
Glucose-Capillary: 176 mg/dL — ABNORMAL HIGH (ref 70–99)
Glucose-Capillary: 183 mg/dL — ABNORMAL HIGH (ref 70–99)
Glucose-Capillary: 185 mg/dL — ABNORMAL HIGH (ref 70–99)
Glucose-Capillary: 194 mg/dL — ABNORMAL HIGH (ref 70–99)
Glucose-Capillary: 199 mg/dL — ABNORMAL HIGH (ref 70–99)
Glucose-Capillary: 264 mg/dL — ABNORMAL HIGH (ref 70–99)
Glucose-Capillary: 271 mg/dL — ABNORMAL HIGH (ref 70–99)
Glucose-Capillary: 306 mg/dL — ABNORMAL HIGH (ref 70–99)

## 2020-08-02 MED ORDER — DILTIAZEM 12 MG/ML ORAL SUSPENSION
90.0000 mg | Freq: Four times a day (QID) | ORAL | Status: DC
  Administered 2020-08-02 – 2020-08-19 (×69): 90 mg
  Filled 2020-08-02 (×75): qty 9

## 2020-08-02 MED ORDER — SODIUM CHLORIDE 0.9 % IV BOLUS
500.0000 mL | Freq: Once | INTRAVENOUS | Status: AC
  Administered 2020-08-02: 500 mL via INTRAVENOUS

## 2020-08-02 NOTE — Progress Notes (Signed)
Sputum sample collected and taken to lab.

## 2020-08-02 NOTE — Progress Notes (Addendum)
NAMERashada Paul, MRN:  [id number], DOB:  [birth year], LOS: 7 ADMISSION DATE:  07/26/2020, CONSULTATION DATE:  07/26/20 REFERRING MD:  Dr. Estanislado Pandy, CHIEF COMPLAINT:  L sided weakness   Brief History   74 y/o F admitted on 8/1 with R ACA/MCA occulusion with L sided hemiparesis and aphasia. s/p Neuro IR Revascularization.   Significant Hospital Events   8/1: Admit with R MCA / ACA occlusion s/p revascularization  8/2: O/N Hypotensive Phenylephrine IV started    Consults: date of consult/date signed off & final recs:  07/26/20: PCCM Procedures (surgical and bedside):  8/1 >> ETT  8/1 >> L Radial Aline Significant Diagnostic Tests:   ECHO 07/03/20 >> LVEF 65-70%, narrow LVOT, turbulent flow through the LV/LVOT, no RWMA, mild LVH, RV systolic function normal, normal PASP.  CT Code Stroke 8/1 >> no acute CT finding, chronic small vessel ischemic changes  CTA Head / Neck 8/1 >> embolic occlusion at the right carotid terminus. Minimal flow seen in the right MCA territory. Right ACA appears normally perfused, probably due to a patent anterior communicating artery.  Core infarct right frontal operculum 16cc, large penumbra, 136 cc in the remainder of the R MCA territory. No atherosclerotic disease at either carotid bifurcation.    CT Head 8/2: >>New mass effect in the anterior right frontal lobe with regional   increasing mixed hypo- and hyperdensity. Favor a combination of   anterior right MCA territory cytotoxic edema and petechial hemorrhage. No malignant   hemorrhage.    MR Brain wo contrast 8/2>>Multifocal ischemic infarction within the R MCA Territory,   greatest at the frontal operculum and insula. Additional cortical ischemia within the R ACA   territory and R PCA territory. Large area of infarction in the R basal ganglia with petechial   hemorrhage vs contrast staining. Punctate focus of acute ischemia in L occipital lobe.   Subjective:  Samantha Paul was seen and evaluated at bedside this AM. She  followed simple commands.   Objective   Blood pressure 126/82, pulse (!) 122, temperature (!) 101 F (38.3 C), temperature source Oral, resp. rate (!) 29, height 5' 2"  (1.575 m), weight 62 kg, SpO2 100 %.    Vent Mode: PSV;CPAP FiO2 (%):  [40 %] 40 % Set Rate:  [16 bmp] 16 bmp Vt Set:  [400 mL] 400 mL PEEP:  [5 cmH20] 5 cmH20 Pressure Support:  [10 cmH20] 10 cmH20 Plateau Pressure:  [19 cmH20-22 cmH20] 22 cmH20   Intake/Output Summary (Last 24 hours) at 08/02/2020 0743 Last data filed at 08/02/2020 0700 Gross per 24 hour  Intake 3586.17 ml  Output 1050 ml  Net 2536.17 ml   Filed Weights   07/29/20 0500 07/30/20 0500 07/31/20 0350  Weight: 62.3 kg 61.4 kg 62 kg    Examination: Physical Exam Constitutional:      Comments: Able to follow commands, does not open eyes to verbal stimulation. Intubated.   HENT:     Mouth/Throat:     Mouth: Mucous membranes are moist.     Comments: Gag reflex intact.  Eyes:     General: No scleral icterus.    Conjunctiva/sclera: Conjunctivae normal.     Pupils: Pupils are equal, round, and reactive to light.     Comments: Dolls eyes intact.   Cardiovascular:     Rate and Rhythm: Tachycardia present.     Heart sounds: No murmur heard.  No friction rub. No gallop.      Comments: Irregularly irregular rhythm. Pulmonary:  Effort: Pulmonary effort is normal.     Breath sounds: Normal breath sounds. No wheezing, rhonchi or rales.  Abdominal:     General: Abdomen is flat. Bowel sounds are normal.  Neurological:     Comments: Able to follow commands on R side. Unable to lift/grip LUE LLE.      Resolved Hospital Problem list   Hypotension  Assessment & Plan:  R ACA / MCA Occlusion / Acute CVA S/p revascularization, large penumbra noted on initial CT imaging. 8/2 MRI showed R ACA and PCA cortical ischemia, large area of infarction in the R basal ganglia with petechial hemorrhage vs contrast staining, with punctate focus of acute ischemia in L  occiptial lobe.  - SBP goal 120-160 - Crestor 20 mg QD - Recommendations per Neuro  Acute Respiratory Failure in setting of Acute CVA Requiring Mechanical Ventilation Continues to intermittently follow commands. Concerns for being unable to protect airway are ongoing, continue to optimize by by frequent SBTs before attempting extubation. 10/5 yesterday during SBT trials.  -Wean PEEP / FiO2 for sats > 90% -Daily SBT  -follow intermittent CXR   Atrial Fibrillation: On Cardizem metoprolol, eliquis prior to admit, CHADS-VASC 6. Patient still tachycardic with pulses ranging 70s-111 ON. Pressures 107-166 SBP. - no anticoagulation / rate control for now with risk of hemorrhagic conversion  - tele monitoring  - Diltiazem 60 mg suspension Q6H - Lopressor to 100 mg BID.  Microcytic Anemia: - QD CBC  DM II with Hyperglycemia   Patient on 20U Lantus at home.  - Increased to 20U Lantus daily. -SSI  - A1c 8.7   AKI:  Increased Cr. 0.76 to 1.4 on 8/6, which continued to 1.56 and is currently at 1.54 during today's examination.  - Continue maintenance fluids - Follow BMP   Fevers, Leukocytosis:  Patient with fever events and a Tmax of 102.57F overnight with leukocytosis downtrending from 26.7 to 22.8. BC NGTD, Tracheal aspirate has few gram + cocci. - Continue to monitor vitals - Continue zosyn  Hyponatremia:  Urine Na of 86 with BMP of 128 today and calculated serum osm of 261. DDx favors SIADH vs CSW. On physical examination patient appears to be euvolemic with decreased UOP. Given neurological insult favoring CSW.  - Challenge with Na 0.9% 500 mL bolus with repeat BMP this PM. If Na improves will increase Infusion. If worsens restrict.   Disposition / Summary of Today's Plan 08/02/20   Continue SBT.  With wishes of the family following their [place] discussion with palliative yesterday, will re-intubate and pursue trach and PEG placement.     Nutritional status and diet:  NPO Pain/Anxiety/Delirium reduction: PAD VAP protocol (if indicated) Yes DVT prophylaxis: SCDs GI prophylaxis: PPI Hyperglycemia protocol: SSI Mobility:Bed bound Code Status: FULL Family Communication: Per primary   Labs and Ancillary Testing (personally reviewed)  CBC: CBC Latest Ref Rng & Units 08/02/2020 08/01/2020 07/31/2020  WBC 4.0 - 10.5 K/uL 22.8(H) 26.7(H) 20.6(H)  Hemoglobin 12.0 - 15.0 g/dL 8.5(L) 8.8(L) 9.3(L)  Hematocrit 36 - 46 % 28.9(L) 30.0(L) 31.5(L)  Platelets 150 - 400 K/uL 194 188 186    Chemistry: CMP Latest Ref Rng & Units 08/02/2020 08/01/2020 07/31/2020  Glucose 70 - 99 mg/dL 206(H) 294(H) 249(H)  BUN 8 - 23 mg/dL 29(H) 28(H) 27(H)  Creatinine 0.44 - 1.00 mg/dL 1.54(H) 1.56(H) 1.40(H)  Sodium 135 - 145 mmol/L 128(L) 126(L) 133(L)  Potassium 3.5 - 5.1 mmol/L 4.4 4.4 5.2(H)  Chloride 98 - 111 mmol/L 98 96(L) 100  CO2 22 - 32 mmol/L 17(L) 21(L) 19(L)  Calcium 8.9 - 10.3 mg/dL 8.0(L) 8.1(L) 8.6(L)  Total Protein 6.5 - 8.1 g/dL - - -  Total Bilirubin 0.3 - 1.2 mg/dL - - -  Alkaline Phos 38 - 126 U/L - - -  AST 15 - 41 U/L - - -  ALT 0 - 44 U/L - - -   ABG:    Component Value Date/Time   PHART 7.373 07/26/2020 1720   PCO2ART 31.1 (L) 07/26/2020 1720   PO2ART 210 (H) 07/26/2020 1720   HCO3 17.8 (L) 07/26/2020 1720   TCO2 21 (L) 07/26/2020 1215   ACIDBASEDEF 6.6 (H) 07/26/2020 1720   O2SAT 99.4 07/26/2020 1720    CBG: CBG (last 3)  Recent Labs    08/01/20 2314 08/02/20 0315 08/02/20 0728  GLUCAP 203* 176* 194*     Microbiology:  8/1>>MRSA PCR Screening: Negative  8/1>> SARS Coronavirus: Negative  Review of Systems:   N/A as patient is on mechanical ventilator  Past medical history  She,  has a past medical history of Afib ([place]) (06/2020), Back pain, CVA (cerebral vascular accident) ([place]) (07/26/2020), DM II (diabetes mellitus, type II), controlled ([place]), HTN (hypertension), Knee pain, and Stroke ([place]) (?).   Surgical History    Past Surgical  History:  Procedure Laterality Date  . CHOLECYSTECTOMY N/A 06/30/2020   Procedure: LAPAROSCOPIC CHOLECYSTECTOMY;  Surgeon: Georganna Skeans, MD;  Location: [facility];  Service: General;  Laterality: N/A;  . IR ANGIO VERTEBRAL SEL SUBCLAVIAN INNOMINATE UNI R MOD SED  07/29/2020  . IR PERCUTANEOUS ART THROMBECTOMY/INFUSION INTRACRANIAL INC DIAG ANGIO  07/26/2020  . RADIOLOGY WITH ANESTHESIA N/A 07/26/2020   Procedure: IR WITH ANESTHESIA;  Surgeon: Radiologist, Medication, MD;  Location: [facility];  Service: Radiology;  Laterality: N/A;     Social History   Social History   Socioeconomic History  . Marital status: Married    Spouse name: Not on file  . Number of children: Not on file  . Years of education: Not on file  . Highest education level: Not on file  Occupational History  . Not on file  Tobacco Use  . Smoking status: Never Smoker  . Smokeless tobacco: Never Used  Substance and Sexual Activity  . Alcohol use: Not Currently  . Drug use: Not Currently  . Sexual activity: Not on file  Other Topics Concern  . Not on file  Social History Narrative  . Not on file   Social Determinants of Health   Financial Resource Strain:   . Difficulty of Paying Living Expenses:   Food Insecurity:   . Worried About Charity fundraiser in the Last Year:   . Arboriculturist in the Last Year:   Transportation Needs:   . Film/video editor (Medical):   Marland Kitchen Lack of Transportation (Non-Medical):   Physical Activity:   . Days of Exercise per Week:   . Minutes of Exercise per Session:   Stress:   . Feeling of Stress :   Social Connections:   . Frequency of Communication with Friends and Family:   . Frequency of Social Gatherings with Friends and Family:   . Attends Religious Services:   . Active Member of Clubs or Organizations:   . Attends Archivist Meetings:   Marland Kitchen Marital Status:   Intimate Partner Violence:   . Fear of Current or Ex-Partner:   . Emotionally Abused:   Marland Kitchen Physically  Abused:   . Sexually Abused:   ,  reports that she has never smoked. She has never used smokeless tobacco. She reports previous alcohol use. She reports previous drug use.   Family history   Her family history includes Arthritis in her brother.   Allergies Allergies  Allergen Reactions  . Metformin And Related Diarrhea    Abdominal pain and diarrhea    Home meds  Prior to Admission medications   Medication Sig Start Date End Date Taking? Authorizing Provider  acetaminophen (TYLENOL) 325 MG tablet Take 2-3 tablets (650-975 mg total) by mouth every 6 (six) hours as needed for mild pain. 07/03/20   Jill Alexanders, PA-C  apixaban (ELIQUIS) 5 MG TABS tablet Take 1 tablet (5 mg total) by mouth 2 (two) times daily. 07/03/20   Oswald Hillock, MD  blood glucose meter kit and supplies KIT Dispense based on patient and insurance preference. Use up to four times daily as directed. (FOR ICD-10--E11.9). 07/25/18   Love, Ivan Anchors, PA-C  diltiazem (CARTIA XT) 240 MG 24 hr capsule Take 1 capsule (240 mg total) by mouth daily. 07/03/20 07/03/21  Oswald Hillock, MD  insulin glargine (LANTUS) 100 UNIT/ML Solostar Pen Inject 20 Units into the skin daily. 07/03/20   Oswald Hillock, MD  Insulin Pen Needle (PEN NEEDLES) 32G X 4 MM MISC Use as directed with insulin pen 07/03/20   Oswald Hillock, MD  metFORMIN (GLUCOPHAGE) 500 MG tablet Take 1 tablet (500 mg total) by mouth 2 (two) times daily with a meal. 07/03/20 07/03/21  Oswald Hillock, MD  metoprolol tartrate (LOPRESSOR) 25 MG tablet Take 0.5 tablets (12.5 mg total) by mouth 2 (two) times daily. 07/03/20   Oswald Hillock, MD  oxyCODONE (OXY IR/ROXICODONE) 5 MG immediate release tablet Take 1 tablet (5 mg total) by mouth every 6 (six) hours as needed for moderate pain or severe pain (pain not releived by tylenol or ibuprofen). 07/03/20   Jill Alexanders, PA-C  rosuvastatin (CRESTOR) 20 MG tablet TAKE 1 TABLET(20 MG) BY MOUTH DAILY 12/20/18   Frann Rider, NP  senna-docusate  (SENOKOT-S) 8.6-50 MG tablet Take 2 tablets by mouth at bedtime. 07/25/18   Bary Leriche, PA-C    Maudie Mercury, MD IMTS, PGY-2 Pager: [phone] 08/02/2020,7:43 AM
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
## 2020-08-02 NOTE — Progress Notes (Addendum)
STROKE TEAM PROGRESS NOTE   INTERVAL HISTORY No family at bedside. Pt still intubated on weaning protocol. Eyes closed but briefly open on voice. As per RN, pt following simple commands on the right with interpretor. Pt still actively moving right UE and LE, withdraw some on the left LE but flaccid on the left UE. Palliative care family meeting at 12pm today.   Vitals:   08/02/20 0348 08/02/20 0400 08/02/20 0500 08/02/20 0600  BP:  122/68 (!) 141/74 (!) 106/58  Pulse: (!) 103 81 (!) 105 97  Resp: (!) 25 (!) 24 (!) 23 (!) 23  Temp:  (!) 101 F (38.3 C)    TempSrc:  Oral    SpO2: 100% 100% 100% 100%  Weight:      Height:       CBC:  Recent Labs  Lab 07/26/20 1211 07/26/20 1215 08/01/20 0907 08/02/20 0444  WBC 6.5   < > 26.7* 22.8*  NEUTROABS 4.1  --   --   --   HGB 10.7*   < > 8.8* 8.5*  HCT 37.7   < > 30.0* 28.9*  MCV 75.1*   < > 70.9* 70.5*  PLT 185   < > 188 194   < > = values in this interval not displayed.   Basic Metabolic Panel:  Recent Labs  Lab 07/28/20 1621 07/29/20 0419 07/29/20 0830 08/01/20 0907 08/02/20 0444  NA  --   --    < > 126* 128*  K  --   --    < > 4.4 4.4  CL  --   --    < > 96* 98  CO2  --   --    < > 21* 17*  GLUCOSE  --   --    < > 294* 206*  BUN  --   --    < > 28* 29*  CREATININE  --   --    < > 1.56* 1.54*  CALCIUM  --   --    < > 8.1* 8.0*  MG 2.1 2.0  --   --   --   PHOS 3.4 3.1  --   --   --    < > = values in this interval not displayed.   Lipid Panel:  Recent Labs  Lab 07/27/20 0637  CHOL 147  TRIG 79  HDL 41  CHOLHDL 3.6  VLDL 16  LDLCALC 90   Lab Results  Component Value Date   HGBA1C 8.7 (H) 07/27/2020   Urine Drug Screen:  Recent Labs  Lab 07/27/20 1254  LABOPIA NONE DETECTED  COCAINSCRNUR NONE DETECTED  LABBENZ NONE DETECTED  AMPHETMU NONE DETECTED  THCU NONE DETECTED  LABBARB NONE DETECTED    Alcohol Level  Recent Labs  Lab 07/26/20 1209  ETH <10    IMAGING past 24 hours CT HEAD WO  CONTRAST  Result Date: 08/01/2020 CLINICAL DATA:  Stroke follow-up. Postop endovascular revascularization of occluded right internal carotid artery distally. EXAM: CT HEAD WITHOUT CONTRAST TECHNIQUE: Contiguous axial images were obtained from the base of the skull through the vertex without intravenous contrast. COMPARISON:  CT head 07/27/2020.  MRI head 07/27/2020 FINDINGS: Brain: Involving acute right MCA infarct. There is progressive edema in the right insula and right frontal lobe. There is involvement of the basal ganglia with a mild amount of hemorrhage in the right basal ganglia. This is unchanged. This is lateral to the area of calcification in the right basal ganglia.  Acute infarct in the caudate is more apparent and lower density today. Right medial frontal lobe infarct also shows progressive edema. Small area of infarct in the right occipital lobe best seen on diffusion-weighted imaging. Generalized atrophy. Negative for hydrocephalus. 1 mm midline shift to the left. No new area of hemorrhage. Chronic infarct in the left pons. Vascular: Negative for hyperdense vessel Skull: Negative Sinuses/Orbits: Mucosal edema paranasal sinuses.  Negative orbit Other: None IMPRESSION: Evolving acute right MCA infarct. Low-density edema infarction is more apparent now in the insula and frontal lobe and basal ganglia. There is progressive low-density in the head of the caudate which did show infarct on prior diffusion imaging. Small amount of hemorrhage in the right basal ganglia is unchanged from the prior CT. Right frontal ACA infarct is unchanged in size but is lower density today. Small acute infarct right occipital lobe best seen on diffusion-weighted imaging. No new area of hemorrhage since the prior studies. Electronically Signed   By: Marlan Palau M.D.   On: 08/01/2020 15:43    PHYSICAL EXAM  Temp:  [99.3 F (37.4 C)-101.3 F (38.5 C)] 101 F (38.3 C) (08/08 0400) Pulse Rate:  [41-138] 97 (08/08  0600) Resp:  [20-28] 23 (08/08 0600) BP: (106-154)/(58-89) 106/58 (08/08 0600) SpO2:  [100 %] 100 % (08/08 0600) FiO2 (%):  [40 %] 40 % (08/08 0348)  General - Well nourished, well developed, intubated not on sedation.  Ophthalmologic - fundi not visualized due to noncooperation.  Cardiovascular - irregularly irregular heart rate and rhythm with tachycardia.  Neuro - intubated not on sedation, eyes closed, briefly open with voice. With forced eye opening, eyes in right gaze position, not able to cross midline. Not blinking to visual threat on the left but blinking on the right, doll's eyes present but not cross midline, not tracking, PERRL. Corneal reflex present, gag and cough present. Breathing over the vent.  Facial symmetry not able to test due to ET tube.  Tongue protrusion not cooperative. On pain stimulation, active movement of RUE and RLE with RUE at least 4/5 and RLE at least 3-/5. LUE 0/5 and LLE 2-/5 withdraw on pain. DTR 1+ and bilateral positive babinski. Sensation not cooperative but brisk response to painful stimulation, coordination not cooperative and gait not tested.   ASSESSMENT/PLAN [name] is a 74 y.o. female with history of atrial fibrillation, HTN, DM and stroke presenting with right gaze deviation, left-sided hemiplegia and dense receptive and expressive aphasia. She received tPA 07/26/2020 at 1259. Taken to IR for R ICA T occlusion.  Stroke:   R MCA and ACA infarct due to R tICA occlusion s/p tPA and IR w/ TICI2c-3, embolic secondary to known AF but not on AC for at least a week  Code Stroke CT head No acute abnormality.     CTA head & neck R tICA occlusion w/ min MCA flow. Some narrowing B VA and BA.   CT perfusion R frontal core 16 cc with penumbra 136 R MCA territory  Cerebral angio RT ICA T occlusion ->TICI 2C revascularization of RT MCA and a TICI 3 revascularization of RT ACA .  CT head post IR - new R frontal lobe hyper and hypointensity (infarct, edema,  petechial hemorrhage)  MRI right MCA and ACA multifocal large infarcts with petechial hemo  CT repeat stable right MCA multifocal infarcts, minimal MLS.  2D Echo 07/03/2020 EF 65-70%. No source of embolus   LDL 90  HgbA1c 12.7  VTE prophylaxis - heparin  subq   Eliquis (apixaban) daily prior to admission, now on aspirin 81 mg. Hold AC for now given petechial hemorrhage, large infarct and potential procedure.  Therapy recommendations: Pending  Disposition:  Pending - palliative care on board, family meeting today 12pm  Acute Respiratory Failure  Secondary to stroke  Intubated for IR, left intubated post IR   off sedation   Still on vent, weaning protocol   Again not candidate for extubation today  CCM on board  Atrial Fibrillation w/ RVR  Home anticoagulation:  Eliquis (apixaban) daily - not on for at least a week  On diltiazem 240, metoprolol 12.5 bid  PTA  Now on diltiazem 60 mg Q6 -> 90 Q6  metoprolol 50 mg Bid per tube  Still RVR today   Will consider cardiology consult if needed . No AC at this time given petechial hemorrhage, large infarct and potential procedure   Hypotension, resolved  Home meds:  None, no hx HTN . BP goal < 160 now . BP stable on the low end . Long-term BP goal normotensive  Hyperlipidemia  Home meds:  crestor 20, resumed in hospital  LDL 90, goal < 70  Continue statin at discharge  Diabetes type II Uncontrolled  Home meds:  lantus 20 daily, metformin 500 bid  HgbA1c 12.7, goal < 7.0  CBGs  SSI  Continued hyperglycemia -> improved today  DM coordinator on board  Continue lantus 20, novolog 5U Q4h  Dysphagia . Secondary to stroke . NPO . Has cortrak . On TF @50  . On IVF @ 50 . Speech on board   Fever, leukocytosis  Tmax 102.3->101.2->100.6  CXR unremarkable  UA WBC 21-50  B Cx - pending  Respiratory culture - pending  WBC 14.7->20.6->26.7->22.8  Put on empiric zosyn 8/7>>  AKI  creatinine -  0.76->1.40->1.56->1.54  On TF @ 50 + Pro Source 45 ml daily  On IVF @ 50   Continue BMP monitoring  Hyponatremia   Na 134->133->126->128  Partially due to hyperglycemia  Close monitoring  Hx of stroke  06/2018 left pontine infarct likely secondary to small vessel disease source. CTA head and neck neg. EF 65-70%. LDL 140 and A1C 12.3. DAPT x 3 weeks and lipitor 40.  Other Stroke Risk Factors  Advanced age  Other Active Problems  Microcytic anemia 10.2->9.4->9.3->8.8->8.5  Code status - Full code  Hospital day # 7  This patient is critically ill due to right MCA and ACA infarct s/p thrombectomy and tPA, afib RVR, fever with leukocytosis, AKI, hyponatremia, hyperglycemia, hypotension and at significant risk of neurological worsening, death form recurrent stroke due to afib not on AC, heart failure, hemorrhagic conversion, renal failure, sepsis, spetic shock and aspiration. This patient's care requires constant monitoring of vital signs, hemodynamics, respiratory and cardiac monitoring, review of multiple databases, neurological assessment, discussion with family, other specialists and medical decision making of high complexity. I spent 35 minutes of neurocritical care time in the care of this patient. Discussed with palliative care NP.   07/2018, MD PhD Stroke Neurology 08/02/2020 11:18 AM    To contact Stroke Continuity provider, please refer to 10/02/2020. After hours, contact General Neurology

## 2020-08-03 ENCOUNTER — Inpatient Hospital Stay (HOSPITAL_COMMUNITY): Payer: Medicare Other

## 2020-08-03 DIAGNOSIS — R509 Fever, unspecified: Secondary | ICD-10-CM | POA: Diagnosis not present

## 2020-08-03 DIAGNOSIS — Z01818 Encounter for other preprocedural examination: Secondary | ICD-10-CM | POA: Diagnosis not present

## 2020-08-03 DIAGNOSIS — Z4659 Encounter for fitting and adjustment of other gastrointestinal appliance and device: Secondary | ICD-10-CM | POA: Diagnosis not present

## 2020-08-03 DIAGNOSIS — I6300 Cerebral infarction due to thrombosis of unspecified precerebral artery: Secondary | ICD-10-CM | POA: Diagnosis not present

## 2020-08-03 LAB — BASIC METABOLIC PANEL
Anion gap: 12 (ref 5–15)
BUN: 32 mg/dL — ABNORMAL HIGH (ref 8–23)
CO2: 20 mmol/L — ABNORMAL LOW (ref 22–32)
Calcium: 8.1 mg/dL — ABNORMAL LOW (ref 8.9–10.3)
Chloride: 101 mmol/L (ref 98–111)
Creatinine, Ser: 1.76 mg/dL — ABNORMAL HIGH (ref 0.44–1.00)
GFR calc Af Amer: 33 mL/min — ABNORMAL LOW (ref >60)
GFR calc non Af Amer: 28 mL/min — ABNORMAL LOW (ref >60)
Glucose, Bld: 131 mg/dL — ABNORMAL HIGH (ref 70–99)
Potassium: 4.5 mmol/L (ref 3.5–5.1)
Sodium: 133 mmol/L — ABNORMAL LOW (ref 135–145)

## 2020-08-03 LAB — URINALYSIS, ROUTINE W REFLEX MICROSCOPIC
Bilirubin Urine: NEGATIVE
Glucose, UA: NEGATIVE mg/dL
Ketones, ur: NEGATIVE mg/dL
Nitrite: NEGATIVE
Protein, ur: 100 mg/dL — AB
RBC / HPF: >50 RBC/hpf — ABNORMAL HIGH (ref 0–5)
Specific Gravity, Urine: 1.021 (ref 1.005–1.030)
WBC, UA: >50 WBC/hpf — ABNORMAL HIGH (ref 0–5)
pH: 5 (ref 5.0–8.0)

## 2020-08-03 LAB — CBC
HCT: 29.4 % — ABNORMAL LOW (ref 36.0–46.0)
Hemoglobin: 8.7 g/dL — ABNORMAL LOW (ref 12.0–15.0)
MCH: 20.9 pg — ABNORMAL LOW (ref 26.0–34.0)
MCHC: 29.6 g/dL — ABNORMAL LOW (ref 30.0–36.0)
MCV: 70.7 fL — ABNORMAL LOW (ref 80.0–100.0)
Platelets: 221 10*3/uL (ref 150–400)
RBC: 4.16 MIL/uL (ref 3.87–5.11)
RDW: 16 % — ABNORMAL HIGH (ref 11.5–15.5)
WBC: 25.3 10*3/uL — ABNORMAL HIGH (ref 4.0–10.5)
nRBC: 0.1 % (ref 0.0–0.2)

## 2020-08-03 LAB — GLUCOSE, CAPILLARY
Glucose-Capillary: 129 mg/dL — ABNORMAL HIGH (ref 70–99)
Glucose-Capillary: 173 mg/dL — ABNORMAL HIGH (ref 70–99)
Glucose-Capillary: 173 mg/dL — ABNORMAL HIGH (ref 70–99)
Glucose-Capillary: 184 mg/dL — ABNORMAL HIGH (ref 70–99)
Glucose-Capillary: 181 mg/dL — ABNORMAL HIGH (ref 70–99)
Glucose-Capillary: 159 mg/dL — ABNORMAL HIGH (ref 70–99)

## 2020-08-03 LAB — SODIUM, URINE, RANDOM: Sodium, Ur: 81 mmol/L

## 2020-08-03 LAB — CREATININE, URINE, RANDOM: Creatinine, Urine: 56.81 mg/dL

## 2020-08-03 MED ORDER — SODIUM CHLORIDE 0.9 % IV SOLN
2.0000 g | INTRAVENOUS | Status: DC
  Administered 2020-08-03 – 2020-08-05 (×3): 2 g via INTRAVENOUS
  Filled 2020-08-03 (×4): qty 20

## 2020-08-03 MED ORDER — LIVING WELL WITH DIABETES BOOK
Freq: Once | Status: AC
  Filled 2020-08-03: qty 1

## 2020-08-03 NOTE — Progress Notes (Signed)
Pharmacy Antibiotic Note  Samantha Paul is a 74 y.o. female admitted on 07/26/2020 with acute stroke.   Pt is now on D3 of zosyn. CXR clear per MD. Abx was change to Unasyn to cover for UTI. Since we don't have sensitivity data, ceftriaxone is much more reliable for coverage. We will use the 2g dose since it's empiric coverage. Ok per Dr. Cyndie Chime.   Plan: Dc zosyn Ceftriaxone 2g IV q24  Height: 5\' 2"  (157.5 cm) Weight: 62 kg (136 lb 11 oz) IBW/kg (Calculated) : 50.1  Temp (24hrs), Avg:99.7 F (37.6 C), Min:98.1 F (36.7 C), Max:102.2 F (39 C)  Recent Labs  Lab 07/30/20 0726 07/30/20 0726 07/31/20 0657 07/31/20 1325 08/01/20 0907 08/02/20 0444 08/02/20 1830 08/03/20 0416  WBC 14.7*  --   --  20.6* 26.7* 22.8*  --  25.3*  CREATININE 0.76   < > 1.40*  --  1.56* 1.54* 1.70* 1.76*   < > = values in this interval not displayed.    Estimated Creatinine Clearance: 25 mL/min (A) (by C-G formula based on SCr of 1.76 mg/dL (H)).    Allergies  Allergen Reactions  . Metformin And Related Diarrhea    Abdominal pain and diarrhea    Antimicrobials this admission: Zosyn 8/7 >> 8/9 Ceftriaxone 8/9>>  Dose adjustments this admission:   Microbiology results: 8/1 MRSA PCR: neg 8/1 COVID neg 8/9 urine>> 8/8 Resp>>GPC on gram stain 8/7 blood>>  10/7, PharmD, [place], AAHIVP, CPP Infectious Disease Pharmacist 08/03/2020 3:54 PM

## 2020-08-03 NOTE — Progress Notes (Signed)
Physical Therapy Treatment Patient Details Name: Samantha Paul MRN: [id number] DOB: [birth year] Today's Date: 08/03/2020    History of Present Illness 74 y.o. female with history of atrial fibrillation, HTN, DM and stroke, who presents with acute onset of right gaze deviation, left-sided hemiplegia and dense receptive and expressive aphasia. 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 Pt received tPA and underwent R ICA and MCA thrombectomy on 8/1. Follow-up MRI demonstrating moderate R MCA as well as R ACA and PCA infarcts with trace petechial hemorrhage.    PT Comments    Pt with noted fever of 102 today however tolerated EOB with totalA for 10 min. Focused on PROM to L UE and LE, truncal rotation and active movement of  R UE/LE. Pt more lethargic with minimal eye opening and decreased command follow however aware pt with fever now. Acute PT to cont to follow to progress mobility.    Follow Up Recommendations  CIR     Equipment Recommendations  Wheelchair (measurements PT);Wheelchair cushion (measurements PT);Hospital bed    Recommendations for Other Services Rehab consult     Precautions / Restrictions Precautions Precautions: Fall Precaution Comments: intubated, ETT Restrictions Weight Bearing Restrictions: No    Mobility  Bed Mobility Overal bed mobility: Needs Assistance Bed Mobility: Rolling;Supine to Sit;Sit to Supine Rolling: Total assist;+2 for physical assistance   Supine to sit: Total assist;+2 for physical assistance Sit to supine: Total assist;+2 for physical assistance   General bed mobility comments: pt doesn't follow commands or initiate transfers, or assist, dependent for all mobility  Transfers                 General transfer comment: deferred due to increased HR into 120s and BP 153/76 as well as diaphoretic, per RN pt with fever  Ambulation/Gait                 Stairs              Wheelchair Mobility    Modified Rankin (Stroke Patients Only) Modified Rankin (Stroke Patients Only) Pre-Morbid Rankin Score: No symptoms Modified Rankin: Severe disability     Balance Overall balance assessment: Needs assistance Sitting-balance support: Bilateral upper extremity supported;Feet unsupported Sitting balance-Leahy Scale: Zero Sitting balance - Comments: pt dependent on posterior support to maintain EOB balance, tolerated 10 min, no protective repsonse when posterior support decreased, pt did use R UE functionally to assist intrying to hold self up. pt completed R LE LAQ x10 reps, unable to complete marching. PROM to L LE, truncal rotation passively completed L/R Postural control: Posterior lean                                  Cognition Arousal/Alertness: Lethargic Behavior During Therapy: Flat affect Overall Cognitive Status: Impaired/Different from baseline                         Following Commands: Follows one step commands inconsistently;Follows one step commands with increased time       General Comments: pt speaks montagnard, spouse translated, pt followed simple commands 75% with R LE but unable with R UE ie. give thumbs up, show me 2 fingers, minimal eye opening t/o session      Exercises Other Exercises Other Exercises: PROM to R UE/LE     General Comments General  comments (skin integrity, edema, etc.): pt with noted secretions requiring suction around ETT upon sitting. HR increased into 120s and BP 153/76 during sitting, pt diaphoretic, RN notified and stated she has a temp of 102      Pertinent Vitals/Pain Pain Assessment: Faces Faces Pain Scale: No hurt Pain Intervention(s): Monitored during session    Home Living                      Prior Function            PT Goals (current goals can now be found in the care plan section) Progress towards PT goals: Not progressing toward goals - comment     Frequency    Min 2X/week      PT Plan Frequency needs to be updated    Co-evaluation              AM-PAC PT "6 Clicks" Mobility   Outcome Measure  Help needed turning from your back to your side while in a flat bed without using bedrails?: Total Help needed moving from lying on your back to sitting on the side of a flat bed without using bedrails?: Total Help needed moving to and from a bed to a chair (including a wheelchair)?: Total Help needed standing up from a chair using your arms (e.g., wheelchair or bedside chair)?: Total Help needed to walk in hospital room?: Total Help needed climbing 3-5 steps with a railing? : Total 6 Click Score: 6    End of Session Equipment Utilized During Treatment: Oxygen (intubated) Activity Tolerance: Patient tolerated treatment well Patient left: in bed;with call bell/phone within reach;with bed alarm set;with restraints reapplied;with family/visitor present Nurse Communication: Mobility status PT Visit Diagnosis: Hemiplegia and hemiparesis;Difficulty in walking, not elsewhere classified (R26.2) Hemiplegia - Right/Left: Left Hemiplegia - dominant/non-dominant: Non-dominant Hemiplegia - caused by: Cerebral infarction     Time: 2992-4268 PT Time Calculation (min) (ACUTE ONLY): 22 min  Charges:  $Neuromuscular Re-education: 8-22 mins                     Lewis Shock, PT, DPT Acute Rehabilitation Services Pager #: [phone] Office #: [phone]    Iona Hansen 08/03/2020, 10:21 AM

## 2020-08-03 NOTE — Progress Notes (Signed)
STROKE TEAM PROGRESS NOTE   INTERVAL HISTORY Husband and RN are at bedside.  Also interpreter present during encounter. Pt still intubated on weaning protocol. Eyes briefly open on voice.  Follows limited midline commands and peripheral commands on the right.  Still have fever T-max 102.2.  Creatinine slightly increase as well as leukocytosis.  Discussed with family, they would like to continue current care and to see how patient will do over the week.   Vitals:   08/03/20 1200 08/03/20 1300 08/03/20 1400 08/03/20 1505  BP: 120/68 111/61 99/60 (!) 136/55  Pulse: 99 93 72 80  Resp:    (!) 22  Temp: 99.2 F (37.3 C)     TempSrc: Axillary     SpO2: 100% 100% 100% 100%  Weight:      Height:       CBC:  Recent Labs  Lab 08/02/20 0444 08/03/20 0416  WBC 22.8* 25.3*  HGB 8.5* 8.7*  HCT 28.9* 29.4*  MCV 70.5* 70.7*  PLT 194 221   Basic Metabolic Panel:  Recent Labs  Lab 07/28/20 1621 07/29/20 0419 07/29/20 0830 08/02/20 1830 08/03/20 0416  NA  --   --    < > 129* 133*  K  --   --    < > 4.4 4.5  CL  --   --    < > 101 101  CO2  --   --    < > 17* 20*  GLUCOSE  --   --    < > 196* 131*  BUN  --   --    < > 32* 32*  CREATININE  --   --    < > 1.70* 1.76*  CALCIUM  --   --    < > 7.9* 8.1*  MG 2.1 2.0  --   --   --   PHOS 3.4 3.1  --   --   --    < > = values in this interval not displayed.   Lipid Panel:  No results for input(s): CHOL, TRIG, HDL, CHOLHDL, VLDL, LDLCALC in the last 168 hours. Lab Results  Component Value Date   HGBA1C 8.7 (H) 07/27/2020   Urine Drug Screen:  No results for input(s): LABOPIA, COCAINSCRNUR, LABBENZ, AMPHETMU, THCU, LABBARB in the last 168 hours.  Alcohol Level  No results for input(s): ETH in the last 168 hours.  IMAGING past 24 hours DG CHEST PORT 1 VIEW  Result Date: 08/03/2020 CLINICAL DATA:  Fevers. EXAM: PORTABLE CHEST 1 VIEW COMPARISON:  08/01/2020 FINDINGS: ETT tip is 3.4 cm above the carina. Feeding tube is noted with tip  looped in the stomach. Mild cardiac enlargement. There is no pleural effusion or edema. No airspace opacities. IMPRESSION: 1. Stable position of support apparatus. 2. Mild cardiac enlargement. Electronically Signed   By: Signa Kell M.D.   On: 08/03/2020 10:29    PHYSICAL EXAM  Temp:  [98.1 F (36.7 C)-102.2 F (39 C)] 99.2 F (37.3 C) (08/09 1200) Pulse Rate:  [61-110] 80 (08/09 1505) Resp:  [20-29] 22 (08/09 1505) BP: (96-138)/(55-85) 136/55 (08/09 1505) SpO2:  [100 %] 100 % (08/09 1505) FiO2 (%):  [40 %] 40 % (08/09 1505)  General - Well nourished, well developed, intubated not on sedation.  Ophthalmologic - fundi not visualized due to noncooperation.  Cardiovascular - irregularly irregular heart rate and rhythm.  Neuro - intubated not on sedation, eyes closed, briefly open with voice.  Able to follow limited midline and  peripheral commands, such as close eyes and wiggle toes, intermittent follow commands on the right hand.  With forced eye opening, eyes in right gaze position, barely cross midline. Not blinking to visual threat on the left but blinking on the right, doll's eyes present but not cross midline, not tracking, PERRL. Corneal reflex present, gag and cough present. Breathing over the vent.  Facial symmetry not able to test due to ET tube.  Tongue protrusion not cooperative. On pain stimulation, active movement of RUE and RLE with RUE at least 4/5 and RLE at least 3-/5. LUE 0/5 and LLE 2-/5 withdraw on pain. DTR 1+ and bilateral positive babinski. Sensation not cooperative but brisk response to painful stimulation, coordination not cooperative and gait not tested.   ASSESSMENT/PLAN [name] is a 74 y.o. female with history of atrial fibrillation, HTN, DM and stroke presenting with right gaze deviation, left-sided hemiplegia and dense receptive and expressive aphasia. She received tPA 07/26/2020 at 1259. Taken to IR for R ICA T occlusion.  Stroke:   R MCA and ACA infarct  due to R tICA occlusion s/p tPA and IR w/ TICI2c-3, embolic secondary to known AF but not on AC for at least a week  Code Stroke CT head No acute abnormality.     CTA head & neck R tICA occlusion w/ min MCA flow. Some narrowing B VA and BA.   CT perfusion R frontal core 16 cc with penumbra 136 R MCA territory  Cerebral angio RT ICA T occlusion ->TICI 2C revascularization of RT MCA and a TICI 3 revascularization of RT ACA .  CT head post IR - new R frontal lobe hyper and hypointensity (infarct, edema, petechial hemorrhage)  MRI right MCA and ACA multifocal large infarcts with petechial hemo  CT repeat stable right MCA multifocal infarcts, minimal MLS.  2D Echo 07/03/2020 EF 65-70%. No source of embolus   LDL 90  HgbA1c 12.7  VTE prophylaxis - heparin subq   Eliquis (apixaban) daily prior to admission, now on aspirin 81 mg. Hold AC for now given petechial hemorrhage, large infarct and potential procedure.  Therapy recommendations: Pending  Disposition:  Pending - full code now and to see how pt does over the week  Acute Respiratory Failure  Secondary to stroke  Intubated for IR, left intubated post IR   off sedation   Still on vent, weaning protocol   Again not candidate for extubation today  CCM on board  Atrial Fibrillation w/ RVR  Home anticoagulation:  Eliquis (apixaban) daily - not on for at least a week  On diltiazem 240, metoprolol 12.5 bid  PTA  Now on diltiazem 60 mg Q6 -> 90 Q6  metoprolol 50 mg Bid -> 100 bid  HR under control now . No AC at this time given petechial hemorrhage, large infarct and potential procedure   Hypotension, resolved  Home meds:  None, no hx HTN . BP goal < 160 now . BP stable today . Long-term BP goal normotensive  Hyperlipidemia  Home meds:  crestor 20, resumed in hospital  LDL 90, goal < 70  Continue statin at discharge  Diabetes type II Uncontrolled  Home meds:  lantus 20 daily, metformin 500 bid  HgbA1c  12.7, goal < 7.0  CBGs  SSI  Continued hyperglycemia -> improved -> within target < 180  DM coordinator assistance appreciated  Continue lantus 20, novolog 5U Q4h  Dysphagia . Secondary to stroke . NPO . Has cortrak . On TF @  50 . On IVF @ 50 . Speech on board   Fever, leukocytosis  Tmax 102.3->101.2->100.6  CXR unremarkable  UA WBC 21-50  U Cx pending  B Cx NGTD  Respiratory culture - pending  WBC 14.7->20.6->26.7->22.8->25.3  zosyn 8/7>>8/9  Rocephin 8/9>>  AKI  creatinine - 0.76->1.40->1.56->1.76  On TF @ 50 + Pro Source 45 ml daily  On IVF @ 50   Continue BMP monitoring  CCM on board  Hyponatremia, improving  Na 134->133->126->128->133  Partially due to hyperglycemia  Close monitoring  Hx of stroke  06/2018 left pontine infarct likely secondary to small vessel disease source. CTA head and neck neg. EF 65-70%. LDL 140 and A1C 12.3. DAPT x 3 weeks and lipitor 40.  Other Stroke Risk Factors  Advanced age  Other Active Problems  Microcytic anemia 10.2->9.4->9.3->8.8->8.7  Code status - Full code  Hospital day # 8  This patient is critically ill due to A. fib RVR, fever with leukocytosis, AKI, hyperglycemia, hypotension, hyponatremia, right MCA and ACA infarct status post thrombectomy and TPA, medication associated pneumonia and at significant risk of neurological worsening, death form heart failure, recurrent stroke, hemorrhagic conversion, renal failure, septic shock, sepsis. This patient's care requires constant monitoring of vital signs, hemodynamics, respiratory and cardiac monitoring, review of multiple databases, neurological assessment, discussion with family, other specialists and medical decision making of high complexity. I spent 45 minutes of neurocritical care time in the care of this patient. I had long discussion with husband at bedside through interpreter, updated pt current condition, treatment plan and potential prognosis, and  answered all the questions. He expressed understanding and appreciation.    Marvel Plan, MD PhD Stroke Neurology 08/03/2020 3:53 PM    To contact Stroke Continuity provider, please refer to [email or web address]. After hours, contact General Neurology

## 2020-08-03 NOTE — Progress Notes (Addendum)
NAMEJalaine Riggenbach, MRN:  [id number], DOB:  1946/01/09, LOS: 8 ADMISSION DATE:  07/26/2020, CONSULTATION DATE:  07/26/20 REFERRING MD:  Dr. Corliss Skains, CHIEF COMPLAINT:  L sided weakness   Brief History   Shonda Widener is a 74 year old lady with past medical history of A. fib, hypertension, diabetes, who was admitted for acute onset of right gaze deviation, left side hemiplegia and expressive aphasia was found to have a right ACA/MCA occlusion status post IR revascularization.  She was admitted to the ICU for mechanical ventilation.   Past Medical History  Atrial fibrillation Hypertension Diabetes  Significant Hospital Events   8/1: Admit with R MCA / ACA occlusion s/p revascularization 8/2: O/N Hypotensive Phenylephrine IV started  Consults:  PCCM Neurology  Procedures:  8/1 >>ETT  8/1 >>L Radial Aline  Significant Diagnostic Tests:  7/9 echocardiogram shows EF of 60 to 70%, narrow LVOT, mild LVH, normal right ventricle systolic function 8/1 CT head showed no acute finding 8/1 CTA showed Embolic occlusion at the right carotid terminus. Minimal flow seen in the right MCA territory. Right ACA territory appears normally perfused, probably due to a patent anterior communicating artery. 8/2 brain MRI showed multifocal ischemic infarction within the right MCA territory. Additional cortical ischemia within the right ACA territory and right PCA territory. Large area of infarction in the right basal ganglia with petechial hemorrhage versus contrast staining. Punctate focus of acute ischemia in the left occipital lobe. 8/7 CT head showed evolving acute right MCA infarct. Low-density edema infarction is more apparent now in the insula and frontal lobe and basal ganglia. There is progressive low-density in the head of the caudate which did show infarct on prior diffusion imaging. Small amount of hemorrhage in the right basal ganglia is unchanged from the prior CT.  Micro Data:     Antimicrobials:  Zosyn 8/7 >>  Interim history/subjective:  Patient is seen at bedside with husband.  She is somnolent but arouses to verbal stimulation.  She is able to follow simple command such as wiggling her toes.  Her husband states that she is better compared to yesterday in terms of mental status.  Objective   Blood pressure 126/60, pulse 92, temperature (!) 102.2 F (39 C), temperature source Axillary, resp. rate (!) 28, height 5\' 2"  (1.575 m), weight 62 kg, SpO2 100 %.    Vent Mode: PSV;CPAP FiO2 (%):  [40 %] 40 % Set Rate:  [16 bmp] 16 bmp Vt Set:  [400 mL] 400 mL PEEP:  [5 cmH20] 5 cmH20 Pressure Support:  [10 cmH20] 10 cmH20 Plateau Pressure:  [15 cmH20-16 cmH20] 16 cmH20   Intake/Output Summary (Last 24 hours) at 08/03/2020 0934 Last data filed at 08/03/2020 0700 Gross per 24 hour  Intake 1860.13 ml  Output 700 ml  Net 1160.13 ml   Filed Weights   07/29/20 0500 07/30/20 0500 07/31/20 0350  Weight: 62.3 kg 61.4 kg 62 kg    Physical Exam Constitutional:      General: She is not in acute distress.    Comments: Sedated but arousable to verbal stimulation  HENT:     Head: Normocephalic.  Eyes:     General: No scleral icterus.       Right eye: No discharge.        Left eye: No discharge.     Conjunctiva/sclera: Conjunctivae normal.     Pupils: Pupils are equal, round, and reactive to light.  Cardiovascular:     Rate and Rhythm: Normal rate. Rhythm irregular.  Heart sounds: No murmur heard.   Pulmonary:     Comments: Coarse breath sound Abdominal:     General: There is no distension.     Palpations: Abdomen is soft.     Tenderness: There is no abdominal tenderness.  Musculoskeletal:     Right lower leg: No edema.     Left lower leg: No edema.  Skin:    General: Skin is warm.     Coloration: Skin is not jaundiced.  Neurological:     Comments: PERRLA Unable to do neuro exam due to patient's mental status     Resolved Hospital Problem list      Assessment & Plan:  R ACA / MCA Occlusion / Acute CVA Acute respiratory failure requiring mechanical ventilation in the setting of CVA Atrial fibrillation Microcytic anemia Type 2 diabetes AKI Leukocytosis Hyponatremia  Plan:  Acute CVA Status post IR revascularization.  CT 8/7 shows evolving right MCA infarct with small amount of hemorrhage in the right basal ganglia which is unchanged.  Neurology is following the patient.  Palliative care was consulted and family would like to continue full scope treatment -Appreciate neurology recommendations -Continue aspirin 81 mg   Acute respiratory failure requiring mechanical ventilation In the setting of CVA.  Patient is currently on PS/CPAP and is doing well.  Patient has difficult time to extubate due to mental status.  Consider trach and a PEG when that family would like to proceed with full scope treatment -Continue ventilator, wean as tolerated   Atrial fibrillation Chads score of 6.  Currently rate controlled with pulse less than 110.  Patient is not a candidate for anticoagulation at this time due to intracranial hemorrhage.  Telemetry shows A. fib in the room. -Continue diltiazem 60 mg every 6 hours -Continue metoprolol 100 mg twice daily -Telemetry monitoring   Leukocytosis.   Fever Patient had a fever 102.2 this morning.  Leukocytosis is trending up from 22-25.  Chest x-ray shows no acute process, looks like pneumonia is clear.   Urinalysis shows large leukocyte esterase with large hemoglobin.  Blood culture obtained shows no growth on day 2.  Sputum culture still pending.  Patient is on Zosyn started on 8/7.  Looking back on her chart, patient has multiple spikes of fever.  Will follow urine culture -Switch ton Ceftriaxone for UTI.  -Pending urine culture -Continue to trend fever and WBC   AKI Creatinine trending up from 1.70-1.76.  FeNa of 1.6% indicated intrinsic.  Will check bladder scan -Continue maintenance  fluids normal saline 50 cc/h -BMP daily   Hyponatremia Urine Na of 86 with BMP of 133 today and calculated serum osm of 261. DDx favors SIADH vs CSW. On physical examination patient appears to be euvolemic with decreased UOP. Given neurological insult favoring CSW.  -Continue normal saline 50 cc/h   Diabetes Hemoglobin A1c of 8.7 CBG within good range today -Continue Lantus 20 units daily -SSI  Best practice:  Diet: 2 feet Pain/Anxiety/Delirium protocol (if indicated): Fentanyl VAP protocol (if indicated): Yes DVT prophylaxis: Heparin GI prophylaxis: Protonix glucose control: Lantus and SSI Mobility: Bedrest Code Status: Full code Family Communication: Per family Disposition: ICU for ventilator   Critical care time: 20 min    Doran Stabler, DO Internal Medicine Residency My pager: [phone]

## 2020-08-04 DIAGNOSIS — E1165 Type 2 diabetes mellitus with hyperglycemia: Secondary | ICD-10-CM

## 2020-08-04 DIAGNOSIS — I6300 Cerebral infarction due to thrombosis of unspecified precerebral artery: Secondary | ICD-10-CM | POA: Diagnosis not present

## 2020-08-04 DIAGNOSIS — J9601 Acute respiratory failure with hypoxia: Secondary | ICD-10-CM | POA: Diagnosis not present

## 2020-08-04 DIAGNOSIS — R739 Hyperglycemia, unspecified: Secondary | ICD-10-CM

## 2020-08-04 DIAGNOSIS — Z7189 Other specified counseling: Secondary | ICD-10-CM

## 2020-08-04 DIAGNOSIS — Z01818 Encounter for other preprocedural examination: Secondary | ICD-10-CM | POA: Diagnosis not present

## 2020-08-04 DIAGNOSIS — I6601 Occlusion and stenosis of right middle cerebral artery: Secondary | ICD-10-CM | POA: Diagnosis not present

## 2020-08-04 DIAGNOSIS — R509 Fever, unspecified: Secondary | ICD-10-CM | POA: Diagnosis not present

## 2020-08-04 LAB — CBC
HCT: 28.3 % — ABNORMAL LOW (ref 36.0–46.0)
Hemoglobin: 8.5 g/dL — ABNORMAL LOW (ref 12.0–15.0)
MCH: 20.8 pg — ABNORMAL LOW (ref 26.0–34.0)
MCHC: 30 g/dL (ref 30.0–36.0)
MCV: 69.2 fL — ABNORMAL LOW (ref 80.0–100.0)
Platelets: 262 10*3/uL (ref 150–400)
RBC: 4.09 MIL/uL (ref 3.87–5.11)
RDW: 16.2 % — ABNORMAL HIGH (ref 11.5–15.5)
WBC: 21.8 10*3/uL — ABNORMAL HIGH (ref 4.0–10.5)
nRBC: 0 % (ref 0.0–0.2)

## 2020-08-04 LAB — CULTURE, RESPIRATORY W GRAM STAIN

## 2020-08-04 LAB — BASIC METABOLIC PANEL
Anion gap: 12 (ref 5–15)
BUN: 33 mg/dL — ABNORMAL HIGH (ref 8–23)
CO2: 21 mmol/L — ABNORMAL LOW (ref 22–32)
Calcium: 8.3 mg/dL — ABNORMAL LOW (ref 8.9–10.3)
Chloride: 100 mmol/L (ref 98–111)
Creatinine, Ser: 1.56 mg/dL — ABNORMAL HIGH (ref 0.44–1.00)
GFR calc Af Amer: 38 mL/min — ABNORMAL LOW (ref >60)
GFR calc non Af Amer: 33 mL/min — ABNORMAL LOW (ref >60)
Glucose, Bld: 126 mg/dL — ABNORMAL HIGH (ref 70–99)
Potassium: 4.5 mmol/L (ref 3.5–5.1)
Sodium: 133 mmol/L — ABNORMAL LOW (ref 135–145)

## 2020-08-04 LAB — GLUCOSE, CAPILLARY
Glucose-Capillary: 175 mg/dL — ABNORMAL HIGH (ref 70–99)
Glucose-Capillary: 154 mg/dL — ABNORMAL HIGH (ref 70–99)
Glucose-Capillary: 174 mg/dL — ABNORMAL HIGH (ref 70–99)
Glucose-Capillary: 167 mg/dL — ABNORMAL HIGH (ref 70–99)
Glucose-Capillary: 204 mg/dL — ABNORMAL HIGH (ref 70–99)
Glucose-Capillary: 158 mg/dL — ABNORMAL HIGH (ref 70–99)

## 2020-08-04 LAB — URINE CULTURE: Culture: <10000 — AB

## 2020-08-04 MED ORDER — SODIUM CHLORIDE 0.9 % IV SOLN
INTRAVENOUS | Status: DC | PRN
  Administered 2020-08-04: 250 mL via INTRAVENOUS
  Administered 2020-08-06 – 2020-08-08 (×2): 500 mL via INTRAVENOUS
  Administered 2020-08-15: 250 mL via INTRAVENOUS

## 2020-08-04 MED ORDER — LACTATED RINGERS IV SOLN: INTRAVENOUS | Status: DC

## 2020-08-04 NOTE — Progress Notes (Signed)
Daily Progress Note   Patient Name: Samantha Paul       Date: 08/04/2020 DOB: June 24, 1946  Age: 74 y.o. MRN#: [id number] Attending Physician: Rosalin Hawking, MD Primary Care Physician: Elwyn Reach, MD Admit Date: 07/26/2020   Patient Profile/HPI:  74 y.o.femalewith past medical history of previous stroke, uncontrolled DM2, and atrial fibrillation on Eliquiswho was admitted on 8/1/2021with left sided weakness and expressive aphasia. She was found to have a right sided CVA in her MCA and ACA. She received TPA. She underwent thrombectomy on 8/1. She is currently intubated but weaning. She is arousable with dense left hemiplegia.   Reason for Consultation/Follow-up: To discuss complex medical decision making related to patient's goals of care.  Continued conversation with Dr Erlinda Hong today, I am present with him at bedside  for assessment   Assessment: Patient remains intubated.  She is consistently following commands, progressing with wean.  Family is open to all offered and available medical interventions to prolong life.   Conversation/Education/GOCs  Met with husband/ Y-Yel R'Mah at bedside spoke via interpretor/ [employer]  c# [phone], language line # [phone].   Patient consistently follow commands with Dr Erlinda Hong  Continued conversation had regarding current medical situation; diagnosis, prognosis, goals of care, disposition and anticipatory care needs.  Patient remains intubated at this time and ongoing discussion regarding possibility of trach and PEG continues.  Created space and opportunity for husband to explore thoughts and feelings regarding his wife's current medical situation.    This is a very difficult situation for he and his family and he repeatedly makes known that he will not  make any decisions without the support of his 4 sons.  Attempted to elicit values and goals of care important to patient and his family.  During today's conversation husband communicates that he wishes his wife "would come back to life", he communicates an understanding of the seriousness of the current medical situation.  When questioned on continuation of aggressive medical interventions, husband communicates "if it is not going to help then commit her to the [facility]"   Husband is able to communicate not only his concerns for his wife's welfare but he worries about the financial impacts of a serious life limiting illness.  Husband is asking for a follow-up meeting to include his 4 sons.  I discussed with nursing  and a meeting for Saturday morning at 9:00 a.m. has been approved.  Myself and today's interpreter will both be present for this ongoing conversation. Name of sons are:     He-Donaghy     Hlong-Kugler     Doang-Darty     Hoaug-Sin   Length of Stay: 9   Vital Signs: BP 129/65   Pulse 69   Temp 99.2 F (37.3 C) (Axillary)   Resp (!) 22   Ht 5' 2"  (1.575 m)   Wt 69 kg   SpO2 100%   BMI 27.82 kg/m  SpO2: SpO2: 100 % O2 Device: O2 Device: Ventilator O2 Flow Rate:         Palliative Assessment/Data:  20%     Palliative Care Plan     Code Status:  Full code  Prognosis:  Unable to determine   Discharge Planning: To Be Determined   Thank you for allowing the Palliative Medicine Team to assist in the care of this patient.  Total time spent:  60  min.     Greater than 50%  of this time was spent counseling and coordinating care related to the above assessment and plan.  Wadie Lessen NP  Palliative Medicine Team Team Phone # [phone] Pager [phone]    Please contact Palliative MedicineTeam phone at [phone] for questions and concerns between 7 am - 7 pm.   Please see AMION for individual provider pager numbers.

## 2020-08-04 NOTE — Progress Notes (Signed)
SLP Cancellation Note  Patient Details Name: Samantha Paul MRN: [id number] DOB: [birth year]   Cancelled treatment:       Reason Eval/Treat Not Completed: Medical issues which prohibited therapy (remains on vent this am). Will continue to follow.     Mahala Menghini., M.A. CCC-SLP Acute Rehabilitation Services Pager [phone] Office [phone]  08/04/2020, 7:22 AM

## 2020-08-04 NOTE — Progress Notes (Signed)
STROKE TEAM PROGRESS NOTE   INTERVAL HISTORY Husband and palliative care NP are at bedside. Pt more awake alert today, open eyes on voice and able to maintain opening. Able to follow some simple commands but not all of them. Still has fever, however, Cre, WBC has improved some.    Vitals:   08/04/20 1127 08/04/20 1200 08/04/20 1300 08/04/20 1400  BP: (!) 141/80 (!) 142/93 129/65 135/73  Pulse: (!) 106 88 69 (!) 51  Resp: (!) 24 (!) 22 (!) 22 (!) 26  Temp:  99.2 F (37.3 C)    TempSrc:  Axillary    SpO2: 100% 100% 100% 100%  Weight:      Height:       CBC:  Recent Labs  Lab 08/03/20 0416 08/04/20 0443  WBC 25.3* 21.8*  HGB 8.7* 8.5*  HCT 29.4* 28.3*  MCV 70.7* 69.2*  PLT 221 262   Basic Metabolic Panel:  Recent Labs  Lab 07/28/20 1621 07/29/20 0419 07/29/20 0830 08/03/20 0416 08/04/20 0443  NA  --   --    < > 133* 133*  K  --   --    < > 4.5 4.5  CL  --   --    < > 101 100  CO2  --   --    < > 20* 21*  GLUCOSE  --   --    < > 131* 126*  BUN  --   --    < > 32* 33*  CREATININE  --   --    < > 1.76* 1.56*  CALCIUM  --   --    < > 8.1* 8.3*  MG 2.1 2.0  --   --   --   PHOS 3.4 3.1  --   --   --    < > = values in this interval not displayed.   Lipid Panel:  No results for input(s): CHOL, TRIG, HDL, CHOLHDL, VLDL, LDLCALC in the last 168 hours. Lab Results  Component Value Date   HGBA1C 8.7 (H) 07/27/2020   Urine Drug Screen:  No results for input(s): LABOPIA, COCAINSCRNUR, LABBENZ, AMPHETMU, THCU, LABBARB in the last 168 hours.  Alcohol Level  No results for input(s): ETH in the last 168 hours.  IMAGING past 24 hours No results found.  PHYSICAL EXAM  Temp:  [97.9 F (36.6 C)-101.6 F (38.7 C)] 99.2 F (37.3 C) (08/10 1200) Pulse Rate:  [49-106] 51 (08/10 1400) Resp:  [18-26] 26 (08/10 1400) BP: (92-159)/(55-93) 135/73 (08/10 1400) SpO2:  [100 %] 100 % (08/10 1400) FiO2 (%):  [40 %] 40 % (08/10 1127) Weight:  [69 kg] 69 kg (08/10 0500)  General -  Well nourished, well developed, intubated not on sedation.  Ophthalmologic - fundi not visualized due to noncooperation.  Cardiovascular - irregularly irregular heart rate and rhythm.  Neuro - intubated not on sedation, eyes closed but easily open with voice and maintained opening.  Able to follow limited midline and peripheral commands, such as close eyes and wiggle toes, showing fingers on the right hand.  Eyes in right gaze position, barely cross midline. Not blinking to visual threat on the left but blinking on the right, doll's eyes present but not cross midline, PERRL. Corneal reflex present, gag and cough present. Breathing over the vent.  Facial symmetry not able to test due to ET tube.  Tongue protrusion not cooperative. On pain stimulation, active movement of RUE and RLE with RUE at least  4/5 and RLE at least 3-/5. LUE 0/5 and LLE 2-/5 withdraw on pain. DTR 1+ and bilateral positive babinski. Sensation not cooperative but brisk response to painful stimulation, coordination not cooperative and gait not tested.   ASSESSMENT/PLAN Samantha Paul is a 74 y.o. female with history of atrial fibrillation, HTN, DM and stroke presenting with right gaze deviation, left-sided hemiplegia and dense receptive and expressive aphasia. She received tPA 07/26/2020 at 1259. Taken to IR for R ICA T occlusion.  Stroke:   R MCA and ACA infarct due to R tICA occlusion s/p tPA and IR w/ TICI2c-3, embolic secondary to known AF but not on AC for at least a week  Code Stroke CT head No acute abnormality.     CTA head & neck R tICA occlusion w/ min MCA flow. Some narrowing B VA and BA.   CT perfusion R frontal core 16 cc with penumbra 136 R MCA territory  Cerebral angio RT ICA T occlusion ->TICI 2C revascularization of RT MCA and a TICI 3 revascularization of RT ACA .  CT head post IR - new R frontal lobe hyper and hypointensity (infarct, edema, petechial hemorrhage)  MRI right MCA and ACA multifocal large infarcts  with petechial hemo  CT repeat stable right MCA multifocal infarcts, minimal MLS.  2D Echo 07/03/2020 EF 65-70%. No source of embolus   LDL 90  HgbA1c 12.7  VTE prophylaxis - heparin subq   Eliquis (apixaban) daily prior to admission, now on aspirin 81 mg. Hold AC for now given petechial hemorrhage, large infarct and potential procedure.  Therapy recommendations: Pending  Disposition:  Pending - full code now and to see how pt does over the week  Acute Respiratory Failure  Secondary to stroke  Intubated for IR, left intubated post IR   off sedation   Still on vent, weaning protocol   Again not candidate for extubation today  CCM on board  Atrial Fibrillation w/ RVR  Home anticoagulation:  Eliquis (apixaban) daily - not on for at least a week  On diltiazem 240, metoprolol 12.5 bid  PTA  Now on diltiazem 60 mg Q6 -> 90 Q6  metoprolol 50 mg Bid -> 100 bid  HR under control now . No AC at this time given petechial hemorrhage, large infarct and potential procedure   Hypotension, resolved  Home meds:  None, no hx HTN . BP goal < 160 now . BP stable today . Long-term BP goal normotensive  Hyperlipidemia  Home meds:  crestor 20, resumed in hospital  LDL 90, goal < 70  Continue statin at discharge  Diabetes type II Uncontrolled  Home meds:  lantus 20 daily, metformin 500 bid  HgbA1c 12.7, goal < 7.0  CBGs  SSI  Continued hyperglycemia -> improved -> within target < 180  DM coordinator assistance appreciated  Continue lantus 20, novolog 5U Q4h  Dysphagia . Secondary to stroke . NPO . Has cortrak . On TF @50  . On IVF @ 50 . Speech on board   Fever, leukocytosis  Tmax 102.3->101.2->100.6->101.6  CXR unremarkable  UA WBC 21-50  U Cx insignificant growth  B Cx NGTD  Respiratory culture - klebsiella pneumo (pan sensitive)  WBC 14.7->20.6->26.7->22.8->25.3->21.8  zosyn 8/7>>8/9  Rocephin 8/9>>  AKI  creatinine -  0.76->1.40->1.56->1.76->1.56  On TF @ 50 + Pro Source 45 ml daily  On IVF @ 75  Continue BMP monitoring  CCM on board  Hyponatremia, improving  Na 134->133->126->128->133  Partially due to hyperglycemia  Close monitoring  Hx of stroke  06/2018 left pontine infarct likely secondary to small vessel disease source. CTA head and neck neg. EF 65-70%. LDL 140 and A1C 12.3. DAPT x 3 weeks and lipitor 40.  Other Stroke Risk Factors  Advanced age  Other Active Problems  Microcytic anemia 10.2->9.4->9.3->8.8->8.7->8.5  Code status - Full code  Hospital day # 9  This patient is critically ill due to large right MCA infarct with petechial hemorrhage, fever with leukocytosis, dysphagia, respiratory failure, A. fib RVR, uncontrolled diabetes with hyperglycemia and at significant risk of neurological worsening, death form recurrent stroke, hemorrhagic conversion, seizure, sepsis, heart failure, DKA. This patient's care requires constant monitoring of vital signs, hemodynamics, respiratory and cardiac monitoring, review of multiple databases, neurological assessment, discussion with family, other specialists and medical decision making of high complexity. I spent 35 minutes of neurocritical care time in the care of this patient. I discussed with palliative care NP.  I had long discussion with husband at bedside, updated pt current condition, treatment plan and potential prognosis, and answered all the questions.  He expressed understanding and appreciation.   Marvel Plan, MD PhD Stroke Neurology 08/04/2020 3:21 PM    To contact Stroke Continuity provider, please refer to [email or web address]. After hours, contact General Neurology

## 2020-08-04 NOTE — Progress Notes (Signed)
NAMEPhillipa Paul, MRN:  [id number], DOB:  [birth year], LOS: 9 ADMISSION DATE:07/26/2020, CONSULTATION DATE: 07/26/20 REFERRING MD: Dr. Corliss Skains, CHIEF COMPLAINT:L sided weakness   Brief History   Samantha Paul is a 74 year old lady with past medical history of A. fib, hypertension, diabetes, who was admitted for acute onset of right gaze deviation, left side hemiplegia and expressive aphasia was found to have a right ACA/MCA occlusion status post IR revascularization.  She was admitted to the ICU for mechanical ventilation.   Past Medical History  Atrial fibrillation Hypertension Diabetes  Significant Hospital Events   8/1: Admit with right MCA/ACA occlusion status post revascularization 8/2: Hypotensive phenylephrine IV started  Consults:  PCCM Neurology  Procedures:  8/1 >>ETT  8/1 >>L Radial Aline  Significant Diagnostic Tests:  7/9 echocardiogram shows EF of 60 to 70%, narrow LVOT, mild LVH, normal right ventricle systolic function 8/1 CT head showed no acute finding 8/1 CTA showed Embolic occlusion at the right carotid terminus. Minimal flow seen in the right MCA territory. Right ACA territory appears normally perfused, probably due to a patent anterior communicating artery. 8/2 brain MRI showed multifocal ischemic infarction within the right MCA territory. Additional cortical ischemia within the right ACA territory and right PCA territory. Large area of infarction in the right basal ganglia with petechial hemorrhage versus contrast staining. Punctate focus of acute ischemia in the left occipital lobe. 8/7 CT head showed evolving acute right MCA infarct. Low-density edema infarction is more apparent now in the insula and frontal lobe and basal ganglia. There is progressive low-density in the head of the caudate which did show infarct on prior diffusion imaging. Small amount of hemorrhage in the right basal ganglia is unchanged from the prior CT.  Micro Data:  8/9 urine  culture looks infected  Antimicrobials:  Zosyn 8/7 >> 8/9 Ceftriaxone 8/9 >>  Interim history/subjective:  Patient is seen at bedside.  She is in no acute distress.  Patient is able to follow simple command.  She is able to lift up her right arm against gravity.  Her husband states that she is more responsive compared to yesterday and was able to had a conversation with her daughter via FaceTime yesterday.  Continue SBT.  Objective   Blood pressure (!) 159/92, pulse (!) 105, temperature 98.1 F (36.7 C), temperature source Axillary, resp. rate (!) 24, height 5\' 2"  (1.575 m), weight 69 kg, SpO2 100 %.    Vent Mode: PRVC FiO2 (%):  [40 %] 40 % Set Rate:  [16 bmp] 16 bmp Vt Set:  [400 mL] 400 mL PEEP:  [5 cmH20] 5 cmH20 Pressure Support:  [10 cmH20] 10 cmH20 Plateau Pressure:  [17 cmH20-19 cmH20] 17 cmH20   Intake/Output Summary (Last 24 hours) at 08/04/2020 0945 Last data filed at 08/04/2020 0900 Gross per 24 hour  Intake 2487.63 ml  Output 1250 ml  Net 1237.63 ml   Filed Weights   07/30/20 0500 07/31/20 0350 08/04/20 0500  Weight: 61.4 kg 62 kg 69 kg    Physical Exam Constitutional:      General: She is not in acute distress.    Comments: Sedated but arousable to verbal and physical stimulation  HENT:     Head: Normocephalic.  Eyes:     General: No scleral icterus.       Right eye: No discharge.        Left eye: No discharge.     Conjunctiva/sclera: Conjunctivae normal.     Pupils: Pupils are equal,  round, and reactive to light.  Cardiovascular:     Rate and Rhythm: Normal rate. Rhythm irregular.     Heart sounds: No murmur heard.  Pulmonary:     Comments: No acute distress.  Normal lungs sound Abdominal:     General: There is no distension.     Palpations: Abdomen is soft.     Tenderness: There is no abdominal tenderness.  Musculoskeletal:     Right lower leg: No edema.     Left lower leg: No edema.  Skin:    General: Skin is warm.     Coloration: Skin is not  jaundiced.  Neurological:     Comments: PERRLA Able to follow simple command Able to hold right arm up against gravity Able to wiggle right toes  Resolved Hospital Problem list     Assessment & Plan:  R ACA / MCA Occlusion / Acute CVA Acute respiratory failure requiring mechanical ventilation in the setting of CVA Atrial fibrillation Microcytic anemia Urinary tract infection Type 2 diabetes AKI Leukocytosis Hyponatremia  Plan:  Acute CVA Status post IR revascularization.  CT 8/7 shows evolving right MCA infarct with small amount of hemorrhage in the right basal ganglia which is unchanged.  Neurology is following the patient.  Palliative care was consulted and family would like to continue full scope treatment. After speaking to her husband, they would like to wait until the end of the week to observe for any signs of improvement. If not patient may need a trach and PEG tube. -Appreciate neurology recommendations -Continue aspirin 81 mg -Continue statin   Acute respiratory failure requiring mechanical ventilation In the setting of CVA.  Patient is currently on PS/CPAP and is doing well.  Patient has difficult time to extubate due to mental status.   Continue to attempt SBT as tolerated. If patient cannot get off the vent, she will need a trach and a PEG tube -Continue ventilator, wean as tolerated -Continue attempt SBT   Atrial fibrillation Chads score of 6.  Currently rate controlled with pulse less than 110.  Patient is not a candidate for anticoagulation at this time due to intracranial hemorrhage.  Telemetry shows A. fib in the room. -Continue diltiazem 60 mg every 6 hours -Continue metoprolol 100 mg twice daily -Telemetry monitoring   Leukocytosis.   Fever Urinary tract infection Patient had another spike of fever of 101.6 last night. However WBC trending down from 25-21. Chest x-ray done yesterday shows clear lungs with no evidence of pneumonia. Urinalysis  shows possible infection. Pending urine culture. Continue treating for UTI with ceftriaxone. -Continue Ceftriaxone for UTI. (1/5) -Pending urine culture -Continue to trend fever and WBC   AKI Baseline creatinine 0.9. FeNa of 1.6% indicated intrinsic. Creatinine trending down from 1.76-1.56.  Bladder scan done yesterday shows no urinary retention. -Continue maintenance fluids with lactated ringer 75 cc/h -BMP daily   Hyponatremia Urine Na of 86 with BMP of 133 today and calculated serum osm of 261. DDx favors SIADH vs CSW. On physical examination patient appears to be euvolemic with decreased UOP. Given neurological insult favoring CSW.  -Continue maintenance fluids of lactated Ringer's 75 cc/h   Diabetes Hemoglobin A1c of 8.7 CBG within good range today -Continue Lantus 20 units daily -Continue NovoLog 5 units every 4 hours -SSI -Monitor CBG   Best practice:  Diet:  Tube feed Pain/Anxiety/Delirium protocol (if indicated): Fentanyl VAP protocol (if indicated): Yes DVT prophylaxis: Heparin GI prophylaxis: Protonix glucose control: Lantus, NovoLog and SSI Mobility: Bedrest  Code Status: Full code Family Communication: Per family Disposition: ICU for ventilator   Critical care time: 20 minutes     Doran Stabler, DO Internal Medicine Residency My pager: [phone]

## 2020-08-05 ENCOUNTER — Inpatient Hospital Stay (HOSPITAL_COMMUNITY): Payer: Medicare Other

## 2020-08-05 DIAGNOSIS — A419 Sepsis, unspecified organism: Secondary | ICD-10-CM

## 2020-08-05 DIAGNOSIS — R6520 Severe sepsis without septic shock: Secondary | ICD-10-CM

## 2020-08-05 LAB — POCT I-STAT 7, (LYTES, BLD GAS, ICA,H+H)
Acid-Base Excess: 0 mmol/L (ref 0.0–2.0)
Bicarbonate: 23.5 mmol/L (ref 20.0–28.0)
Calcium, Ion: 1.13 mmol/L — ABNORMAL LOW (ref 1.15–1.40)
HCT: 32 % — ABNORMAL LOW (ref 36.0–46.0)
Hemoglobin: 10.9 g/dL — ABNORMAL LOW (ref 12.0–15.0)
O2 Saturation: 100 %
Patient temperature: 100.7
Potassium: 4.5 mmol/L (ref 3.5–5.1)
Sodium: 134 mmol/L — ABNORMAL LOW (ref 135–145)
TCO2: 25 mmol/L (ref 22–32)
pCO2 arterial: 36.3 mmHg (ref 32.0–48.0)
pH, Arterial: 7.423 (ref 7.350–7.450)
pO2, Arterial: 197 mmHg — ABNORMAL HIGH (ref 83.0–108.0)

## 2020-08-05 LAB — BASIC METABOLIC PANEL
Anion gap: 13 (ref 5–15)
BUN: 30 mg/dL — ABNORMAL HIGH (ref 8–23)
CO2: 21 mmol/L — ABNORMAL LOW (ref 22–32)
Calcium: 8.3 mg/dL — ABNORMAL LOW (ref 8.9–10.3)
Chloride: 97 mmol/L — ABNORMAL LOW (ref 98–111)
Creatinine, Ser: 1.49 mg/dL — ABNORMAL HIGH (ref 0.44–1.00)
GFR calc Af Amer: 40 mL/min — ABNORMAL LOW (ref >60)
GFR calc non Af Amer: 35 mL/min — ABNORMAL LOW (ref >60)
Glucose, Bld: 167 mg/dL — ABNORMAL HIGH (ref 70–99)
Potassium: 4.6 mmol/L (ref 3.5–5.1)
Sodium: 131 mmol/L — ABNORMAL LOW (ref 135–145)

## 2020-08-05 LAB — CBC
HCT: 28 % — ABNORMAL LOW (ref 36.0–46.0)
Hemoglobin: 8.2 g/dL — ABNORMAL LOW (ref 12.0–15.0)
MCH: 20.1 pg — ABNORMAL LOW (ref 26.0–34.0)
MCHC: 29.3 g/dL — ABNORMAL LOW (ref 30.0–36.0)
MCV: 68.8 fL — ABNORMAL LOW (ref 80.0–100.0)
Platelets: 318 10*3/uL (ref 150–400)
RBC: 4.07 MIL/uL (ref 3.87–5.11)
RDW: 16.3 % — ABNORMAL HIGH (ref 11.5–15.5)
WBC: 22.7 10*3/uL — ABNORMAL HIGH (ref 4.0–10.5)
nRBC: 0 % (ref 0.0–0.2)

## 2020-08-05 LAB — GLUCOSE, CAPILLARY
Glucose-Capillary: 113 mg/dL — ABNORMAL HIGH (ref 70–99)
Glucose-Capillary: 197 mg/dL — ABNORMAL HIGH (ref 70–99)
Glucose-Capillary: 205 mg/dL — ABNORMAL HIGH (ref 70–99)
Glucose-Capillary: 145 mg/dL — ABNORMAL HIGH (ref 70–99)
Glucose-Capillary: 142 mg/dL — ABNORMAL HIGH (ref 70–99)
Glucose-Capillary: 161 mg/dL — ABNORMAL HIGH (ref 70–99)

## 2020-08-05 LAB — OSMOLALITY, URINE: Osmolality, Ur: 602 mOsm/kg (ref 300–900)

## 2020-08-05 MED ORDER — SODIUM CHLORIDE 0.9 % IV SOLN: INTRAVENOUS | Status: DC

## 2020-08-05 MED ORDER — INSULIN GLARGINE 100 UNIT/ML ~~LOC~~ SOLN
22.0000 [IU] | Freq: Every day | SUBCUTANEOUS | Status: DC
  Administered 2020-08-06 – 2020-08-23 (×18): 22 [IU] via SUBCUTANEOUS
  Filled 2020-08-05 (×18): qty 0.22

## 2020-08-05 NOTE — Progress Notes (Addendum)
NAMEMeiling Hendriks, MRN:  [id number], DOB:  [birth year], LOS: 10 ADMISSION DATE:07/26/2020, CONSULTATION DATE: 07/26/20 REFERRING MD: Dr. Corliss Skains, CHIEF COMPLAINT:L sided weakness   Brief History   Samantha Paul is a 74 year old lady with past medical history of A. fib, hypertension, diabetes, who was admitted for acute onset of right gaze deviation, left side hemiplegia and expressive aphasia was found to have a right ACA/MCA occlusion status post IR revascularization.  She was admitted to the ICU for mechanical ventilation.   Past Medical History  Atrial fibrillation Hypertension Diabetes  Significant Hospital Events   8/1: Admit with right MCA/ACA occlusion status post revascularization 8/2: Hypotensive phenylephrine IV started  Consults:  PCCM Neurology  Procedures:  8/1 >>ETT  8/1 >>L Radial Aline  Significant Diagnostic Tests:  7/9 echocardiogram shows EF of 60 to 70%, narrow LVOT, mild LVH, normal right ventricle systolic function 8/1 CT head showed no acute finding 8/1 CTA showed Embolic occlusion at the right carotid terminus. Minimal flow seen in the right MCA territory. Right ACA territory appears normally perfused, probably due to a patent anterior communicating artery. 8/2 brain MRI showed multifocal ischemic infarction within the right MCA territory. Additional cortical ischemia within the right ACA territory and right PCA territory. Large area of infarction in the right basal ganglia with petechial hemorrhage versus contrast staining. Punctate focus of acute ischemia in the left occipital lobe. 8/7 CT head showed evolving acute right MCA infarct. Low-density edema infarction is more apparent now in the insula and frontal lobe and basal ganglia. There is progressive low-density in the head of the caudate which did show infarct on prior diffusion imaging. Small amount of hemorrhage in the right basal ganglia is unchanged from the prior CT.  Micro Data:  8/9 urine  culture looks infected 8/10 sputum culture shows rare Klebsiella 8/7 blood culture no growth on day 3 Antimicrobials:  Zosyn 8/7 >> 8/9 Ceftriaxone 8/9 >>  Interim history/subjective:  Patient is seen at bedside.  She is awake and able to follow simple commands.  No significant improvement noted.   Objective   Blood pressure 126/72, pulse 82, temperature 99.4 F (37.4 C), temperature source Axillary, resp. rate (!) 25, height 5\' 2"  (1.575 m), weight 70.2 kg, SpO2 100 %.    Vent Mode: PSV;CPAP FiO2 (%):  [40 %] 40 % Set Rate:  [16 bmp] 16 bmp Vt Set:  [400 mL] 400 mL PEEP:  [5 cmH20] 5 cmH20 Pressure Support:  [10 cmH20] 10 cmH20 Plateau Pressure:  [13 cmH20-17 cmH20] 15 cmH20   Intake/Output Summary (Last 24 hours) at 08/05/2020 0752 Last data filed at 08/05/2020 0700 Gross per 24 hour  Intake 3077.43 ml  Output 850 ml  Net 2227.43 ml   Filed Weights   07/31/20 0350 08/04/20 0500 08/05/20 0341  Weight: 62 kg 69 kg 70.2 kg    Physical Exam Constitutional:      General: She is not in acute distress.    Comments: Awake and able to follow simple command.  No significant improvement compared to yesterday HENT:     Head: Normocephalic.  Eyes: PERRLA    General: No scleral icterus.       Right eye: No discharge.        Left eye: No discharge.     Conjunctiva/sclera: Conjunctivae normal.     Pupils: Pupils are equal, round, and reactive to light.  Cardiovascular:     Rate and Rhythm: Normal rate. Rhythm irregular.     Heart  sounds: No murmur heard.  Pulmonary:     Comments: No acute distress.  Normal lungs sound Abdominal:     General: There is no distension.     Palpations: Abdomen is soft.     Tenderness: There is no abdominal tenderness.  Musculoskeletal:     Right lower leg: No edema.     Left lower leg: No edema.     Right hand: Edematous due to blood drawn Skin:    General: Skin is warm.     Coloration: Skin is not jaundiced.  Neurological:     Comments:  PERRLA Able to follow simple command Able to move right arm but unable to hold up against gravity Able to wiggle right toes  Resolved Hospital Problem list     Assessment & Plan:  R ACA / MCA Occlusion / Acute CVA Acute respiratory failure requiring mechanical ventilation in the setting of CVA Atrial fibrillation Microcytic anemia Urinary tract infection Klebsiella pneumonia Type 2 diabetes AKI Leukocytosis Hyponatremia  Plan:  Acute CVA Status post IR revascularization.  CT 8/7 shows evolving right MCA infarct with small amount of hemorrhage in the right basal ganglia which is unchanged.  Neurology is following the patient.  Palliative care was consulted and family would like to continue full scope treatment. After speaking to her husband, they would like to wait until the end of the week to observe for any signs of improvement.  Will meet with family and palliative team on Saturday to decide the course of treatment and goals of care -Appreciate neurology recommendations -Continue aspirin 81 mg -Continue statin   Acute respiratory failure requiring mechanical ventilation In the setting of CVA.  Patient is currently on PS/CPAP and is doing well.  Patient has difficult time to extubate due to mental status.   Continue to attempt SBT as tolerated. If patient cannot get off the vent, she will need a trach and a PEG tube.  Discussed with family regarding goals of care on Saturday -Continue ventilator, wean as tolerated -Continue attempt SBT --Check ABG   Atrial fibrillation Chads score of 6.  Currently rate controlled with pulse less than 110.  Patient is not a candidate for anticoagulation at this time due to intracranial hemorrhage.  Telemetry shows A. fib in the room. -Continue diltiazem 60 mg every 6 hours -Continue metoprolol 100 mg twice daily -Telemetry monitoring   Leukocytosis.   Fever Klebsiella pneumonia Patient had another spike of fever of 101 in the  last 24 hours. However WBC trending down from 25-21. Chest x-ray 8/9 shows clear lungs with no evidence of pneumonia however sputum culture shows rare Klebsiella.  Repeat CXR shows worsening infiltrate. Continue Ceftriaxone -Continue Ceftriaxone (2/7) -Continue to trend fever and WBC   AKI Hyponatremia Hypochloremia Baseline creatinine 0.9. FeNa of 1.6% indicated intrinsic. Creatinine trending down from 1.56-1.49. Check urine Osmo to confirm SIADH --Pending urine Os --No fluid at this time   Diabetes Hemoglobin A1c of 8.7  -Increase Lantus 22 units daily -Continue NovoLog 5 units every 4 hours -SSI -Monitor CBG   Best practice:  Diet:  Tube feed Pain/Anxiety/Delirium protocol (if indicated): Fentanyl VAP protocol (if indicated): Yes DVT prophylaxis: Heparin GI prophylaxis: Protonix glucose control: Lantus, NovoLog and SSI Mobility: Bedrest Code Status: Full code Family Communication: Per family Disposition: ICU for ventilator   Critical care time: 20 minutes     Doran Stabler, DO Internal Medicine Residency My pager: [phone]

## 2020-08-05 NOTE — Progress Notes (Signed)
STROKE TEAM PROGRESS NOTE   INTERVAL HISTORY Husband is at bedside. Pt still awake alert today, open eyes on voice and able to maintain opening. Able to follow some simple commands but not all of them. Still has fever, AKI and leukocytosis. Condition stable, no significant change.    Vitals:   08/05/20 1000 08/05/20 1100 08/05/20 1130 08/05/20 1200  BP: 118/68 129/67  92/61  Pulse: 76 98  (!) 43  Resp: (!) 27 (!) 22  17  Temp:      TempSrc:      SpO2: 100% 100% 100% 100%  Weight:      Height:       CBC:  Recent Labs  Lab 08/04/20 0443 08/04/20 0443 08/05/20 0628 08/05/20 1136  WBC 21.8*  --  22.7*  --   HGB 8.5*   < > 8.2* 10.9*  HCT 28.3*   < > 28.0* 32.0*  MCV 69.2*  --  68.8*  --   PLT 262  --  318  --    < > = values in this interval not displayed.   Basic Metabolic Panel:  Recent Labs  Lab 08/04/20 0443 08/04/20 0443 08/05/20 0628 08/05/20 1136  NA 133*   < > 131* 134*  K 4.5   < > 4.6 4.5  CL 100  --  97*  --   CO2 21*  --  21*  --   GLUCOSE 126*  --  167*  --   BUN 33*  --  30*  --   CREATININE 1.56*  --  1.49*  --   CALCIUM 8.3*  --  8.3*  --    < > = values in this interval not displayed.   Lipid Panel:  No results for input(s): CHOL, TRIG, HDL, CHOLHDL, VLDL, LDLCALC in the last 168 hours. Lab Results  Component Value Date   HGBA1C 8.7 (H) 07/27/2020   Urine Drug Screen:  No results for input(s): LABOPIA, COCAINSCRNUR, LABBENZ, AMPHETMU, THCU, LABBARB in the last 168 hours.  Alcohol Level  No results for input(s): ETH in the last 168 hours.  IMAGING past 24 hours DG CHEST PORT 1 VIEW  Result Date: 08/05/2020 CLINICAL DATA:  Fever EXAM: PORTABLE CHEST 1 VIEW COMPARISON:  08/03/2020 FINDINGS: Endotracheal tube terminates 3 cm above the carina. Weighted feeding tube terminates in the gastric cardia. Lungs are clear. No pleural effusion or pneumothorax. Mild eventration of the right hemidiaphragm. Cardiomegaly. IMPRESSION: Endotracheal tube  terminates 3 cm above the carina. Weighted feeding tube terminates in the gastric cardia. Electronically Signed   By: Charline Bills M.D.   On: 08/05/2020 09:53    PHYSICAL EXAM  Temp:  [98.4 F (36.9 C)-101 F (38.3 C)] 100.7 F (38.2 C) (08/11 0800) Pulse Rate:  [41-120] 43 (08/11 1200) Resp:  [17-27] 17 (08/11 1200) BP: (92-144)/(59-97) 92/61 (08/11 1200) SpO2:  [100 %] 100 % (08/11 1200) FiO2 (%):  [40 %] 40 % (08/11 1130) Weight:  [70.2 kg] 70.2 kg (08/11 0341)  General - Well nourished, well developed, intubated not on sedation.  Ophthalmologic - fundi not visualized due to noncooperation.  Cardiovascular - irregularly irregular heart rate and rhythm.  Neuro - intubated not on sedation, eyes closed but easily open with voice and maintained opening.  Able to follow limited midline and peripheral commands, such as close eyes and wiggle toes, showing fingers on the right hand.  Eyes in right gaze position, barely cross midline. Not blinking to visual threat on the  left but blinking on the right, doll's eyes present but not cross midline, PERRL. Corneal reflex present, gag and cough present. Breathing over the vent.  Facial symmetry not able to test due to ET tube.  Tongue protrusion not cooperative. On pain stimulation, active movement of RUE and RLE with RUE at least 4/5 and RLE at least 3-/5. LUE 0/5 and LLE 2-/5 withdraw on pain. DTR 1+ and bilateral positive babinski. Sensation not cooperative but brisk response to painful stimulation, coordination not cooperative and gait not tested.   ASSESSMENT/PLAN [name] is a 74 y.o. female with history of atrial fibrillation, HTN, DM and stroke presenting with right gaze deviation, left-sided hemiplegia and dense receptive and expressive aphasia. She received tPA 07/26/2020 at 1259. Taken to IR for R ICA T occlusion.  Stroke:   R MCA and ACA infarct due to R tICA occlusion s/p tPA and IR w/ TICI2c-3, embolic secondary to known AF but  not on AC for at least a week  Code Stroke CT head No acute abnormality.     CTA head & neck R tICA occlusion w/ min MCA flow. Some narrowing B VA and BA.   CT perfusion R frontal core 16 cc with penumbra 136 R MCA territory  Cerebral angio RT ICA T occlusion ->TICI 2C revascularization of RT MCA and a TICI 3 revascularization of RT ACA .  CT head post IR - new R frontal lobe hyper and hypointensity (infarct, edema, petechial hemorrhage)  MRI right MCA and ACA multifocal large infarcts with petechial hemo  CT repeat stable right MCA multifocal infarcts, minimal MLS.  2D Echo 07/03/2020 EF 65-70%. No source of embolus   LDL 90  HgbA1c 12.7  VTE prophylaxis - heparin subq   Eliquis (apixaban) daily prior to admission, now on aspirin 81 mg. Hold AC for now given petechial hemorrhage, large infarct and potential procedure.  Therapy recommendations: Pending  Disposition:  Pending - full code for now and observe progress over the week  Acute Respiratory Failure  Secondary to stroke  Intubated for IR, left intubated post IR   off sedation   Still on vent, weaning protocol   Again not candidate for extubation today  CCM on board  Atrial Fibrillation w/ RVR  Home anticoagulation:  Eliquis (apixaban) daily - not on for at least a week  On diltiazem 240, metoprolol 12.5 bid  PTA  Now on diltiazem 60 mg Q6 -> 90 Q6  metoprolol 50 mg Bid -> 100 bid  HR under control now  No AC at this time given petechial hemorrhage, large infarct and potential procedure   Hypotension, resolved  Home meds:  None, no hx HTN  BP goal < 160 now  BP stable  Long-term BP goal normotensive  Hyperlipidemia  Home meds:  crestor 20, resumed in hospital  LDL 90, goal < 70  Continue statin at discharge  Diabetes type II Uncontrolled  Home meds:  lantus 20 daily, metformin 500 bid  HgbA1c 12.7, goal < 7.0  CBGs  SSI  Continued hyperglycemia -> improved -> within target <  180  DM coordinator assistance appreciated  Continue lantus 20, novolog 5U Q4h  Dysphagia  Secondary to stroke  NPO  Has cortrak  On TF @50   On IVF @ 50 -> off  Speech on board   Fever, leukocytosis  Tmax 102.3->101.2->100.6->101.6->101  CXR unremarkable  UA WBC 21-50  U Cx insignificant growth  B Cx NGTD  Respiratory culture - klebsiella pneumo (  pan sensitive)  WBC 14.7->20.6->26.7->22.8->25.3->21.8->22.7  zosyn 8/7>>8/9  Rocephin 8/9>>  AKI  creatinine - 0.76->1.40->1.56->1.76->1.56->1.49  On TF @ 50 + Pro Source 45 ml daily  On IVF @ 75 -> @ 50 -> off  Continue BMP monitoring  CCM on board  Hyponatremia, improving  Na 134->133->126->128->133->134  Partially due to hyperglycemia  Close monitoring  Hx of stroke  06/2018 left pontine infarct likely secondary to small vessel disease source. CTA head and neck neg. EF 65-70%. LDL 140 and A1C 12.3. DAPT x 3 weeks and lipitor 40.  Other Stroke Risk Factors  Advanced age  Other Active Problems  Microcytic anemia 10.2->9.4->9.3->8.8->8.7->8.5->8.2  Code status - Full code  Hospital day # 10  This patient is critically ill due to fever, leukocytosis, right large MCA infarct, hemorrhagic conversion, afib RVR, hyperglycemia, dysphagia and at significant risk of neurological worsening, death form recurrent stroke, hemorrhagic conversion, cerebral edema, heart failure, seizure, DKA, aspiration. This patient's care requires constant monitoring of vital signs, hemodynamics, respiratory and cardiac monitoring, review of multiple databases, neurological assessment, discussion with family, other specialists and medical decision making of high complexity. I spent 35 minutes of neurocritical care time in the care of this patient. I had long discussion with husband at bedside, updated pt current condition, treatment plan and potential prognosis, and answered all the questions. He expressed understanding and  appreciation.     Marvel Plan, MD PhD Stroke Neurology 08/05/2020 12:30 PM    To contact Stroke Continuity provider, please refer to [email or web address]. After hours, contact General Neurology

## 2020-08-05 NOTE — Progress Notes (Signed)
SLP Cancellation Note  Patient Details Name: Samantha Paul MRN: [id number] DOB: 11-01-1946   Cancelled treatment:       Reason Eval/Treat Not Completed: Medical issues which prohibited therapy. Pt continues to be on the vent. Will sign off for now. Please reorder when ready.    Mahala Menghini., M.A. CCC-SLP Acute Rehabilitation Services Pager [phone] Office [phone]  08/05/2020, 8:18 AM

## 2020-08-05 NOTE — Progress Notes (Signed)
Occupational Therapy Treatment Patient Details Name: Samantha Paul MRN: [id number] DOB: 05-28-1946 Today's Date: 08/05/2020    History of present illness 74 y.o. female with history of atrial fibrillation, HTN, DM and stroke, who presents with acute onset of right gaze deviation, left-sided hemiplegia and dense receptive and expressive aphasia. 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 Pt received tPA and underwent R ICA and MCA thrombectomy on 8/1. Follow-up MRI demonstrating moderate R MCA as well as R ACA and PCA infarcts with trace petechial hemorrhage.   OT comments  Pt seen with egress position.  She was lethargic, but will arouse with stimulation. She will follow one step simple motor commands with the Rt UE.  She continues to demonstrate Rt gaze preference and requires max A to look to the Lt.  She continues to require total A for all aspects of ADLs.  No active movement noted on Lt, but full PROM of Lt UE achieved.  Will continue to follow.  Disposition recommendations changed to SNF.   Follow Up Recommendations  SNF;LTACH    Equipment Recommendations  None recommended by OT    Recommendations for Other Services      Precautions / Restrictions Precautions Precautions: Fall Precaution Comments: intubated, ETT, dense Lt hemiplegic with Rt gaze preference        Mobility Bed Mobility Overal bed mobility: Needs Assistance Bed Mobility: Rolling Rolling: Total assist            Transfers                 General transfer comment: unable to safely attempt     Balance Overall balance assessment: Needs assistance Sitting-balance support: No upper extremity supported Sitting balance-Leahy Scale: Zero Sitting balance - Comments: in egress position, pt requires total A to maintain unsupported sitting                                    ADL either performed or assessed with clinical judgement    ADL Overall ADL's : Needs assistance/impaired Eating/Feeding: NPO   Grooming: Total assistance;Bed level                                       Vision   Additional Comments: Pt with rt gaze preference. She will look to the Lt with max cues/assist    Perception     Praxis      Cognition Arousal/Alertness: Lethargic Behavior During Therapy: Flat affect Overall Cognitive Status: Impaired/Different from baseline Area of Impairment: Attention;Following commands                   Current Attention Level: Focused   Following Commands: Follows one step commands inconsistently;Follows one step commands with increased time       General Comments: Pt will follow one step simple motor commands on her Rt side with a slight delay.  She follows no commands on her Lt.         Exercises Other Exercises Other Exercises: PROM lt shoulder flex, wrist flex/ext, elbow flex ext, finger flex/ext x 10 reps each   Shoulder Instructions       General Comments Pt's spouse present.  VSS throughout session     Pertinent Vitals/ Pain  Pain Assessment: Faces Faces Pain Scale: Hurts a little bit Pain Location: generalized Pain Descriptors / Indicators: Grimacing Pain Intervention(s): Monitored during session;Repositioned  Home Living                                          Prior Functioning/Environment              Frequency  Min 2X/week        Progress Toward Goals  OT Goals(current goals can now be found in the care plan section)  Progress towards OT goals: Not progressing toward goals - comment     Plan Discharge plan needs to be updated    Co-evaluation                 AM-PAC OT "6 Clicks" Daily Activity     Outcome Measure   Help from another person eating meals?: Total Help from another person taking care of personal grooming?: Total Help from another person toileting, which includes using toliet, bedpan, or  urinal?: Total Help from another person bathing (including washing, rinsing, drying)?: Total Help from another person to put on and taking off regular upper body clothing?: Total Help from another person to put on and taking off regular lower body clothing?: Total 6 Click Score: 6    End of Session Equipment Utilized During Treatment: Oxygen  OT Visit Diagnosis: Unsteadiness on feet (R26.81);Cognitive communication deficit (R41.841);Hemiplegia and hemiparesis Symptoms and signs involving cognitive functions: Cerebral infarction Hemiplegia - Right/Left: Left Hemiplegia - dominant/non-dominant: Non-Dominant Hemiplegia - caused by: Cerebral infarction   Activity Tolerance Patient limited by fatigue   Patient Left in bed;with call bell/phone within reach;with family/visitor present   Nurse Communication Mobility status        Time: 2671-2458 OT Time Calculation (min): 22 min  Charges: OT General Charges $OT Visit: 1 Visit OT Treatments $Neuromuscular Re-education: 8-22 mins  Eber Jones OTR/L Acute Rehabilitation Services Pager [phone] Office [phone]    Jeani Hawking M 08/05/2020, 12:14 PM

## 2020-08-06 ENCOUNTER — Inpatient Hospital Stay (HOSPITAL_COMMUNITY): Payer: Medicare Other

## 2020-08-06 DIAGNOSIS — R609 Edema, unspecified: Secondary | ICD-10-CM

## 2020-08-06 DIAGNOSIS — J150 Pneumonia due to Klebsiella pneumoniae: Secondary | ICD-10-CM

## 2020-08-06 LAB — GLUCOSE, CAPILLARY
Glucose-Capillary: 123 mg/dL — ABNORMAL HIGH (ref 70–99)
Glucose-Capillary: 98 mg/dL (ref 70–99)
Glucose-Capillary: 141 mg/dL — ABNORMAL HIGH (ref 70–99)
Glucose-Capillary: 164 mg/dL — ABNORMAL HIGH (ref 70–99)
Glucose-Capillary: 146 mg/dL — ABNORMAL HIGH (ref 70–99)
Glucose-Capillary: 133 mg/dL — ABNORMAL HIGH (ref 70–99)

## 2020-08-06 LAB — CBC
HCT: 26.9 % — ABNORMAL LOW (ref 36.0–46.0)
Hemoglobin: 8.2 g/dL — ABNORMAL LOW (ref 12.0–15.0)
MCH: 21 pg — ABNORMAL LOW (ref 26.0–34.0)
MCHC: 30.5 g/dL (ref 30.0–36.0)
MCV: 69 fL — ABNORMAL LOW (ref 80.0–100.0)
Platelets: 327 10*3/uL (ref 150–400)
RBC: 3.9 MIL/uL (ref 3.87–5.11)
RDW: 16.4 % — ABNORMAL HIGH (ref 11.5–15.5)
WBC: 20.6 10*3/uL — ABNORMAL HIGH (ref 4.0–10.5)
nRBC: 0 % (ref 0.0–0.2)

## 2020-08-06 LAB — BASIC METABOLIC PANEL
Anion gap: 12 (ref 5–15)
BUN: 32 mg/dL — ABNORMAL HIGH (ref 8–23)
CO2: 21 mmol/L — ABNORMAL LOW (ref 22–32)
Calcium: 8.1 mg/dL — ABNORMAL LOW (ref 8.9–10.3)
Chloride: 100 mmol/L (ref 98–111)
Creatinine, Ser: 1.4 mg/dL — ABNORMAL HIGH (ref 0.44–1.00)
GFR calc Af Amer: 43 mL/min — ABNORMAL LOW (ref >60)
GFR calc non Af Amer: 37 mL/min — ABNORMAL LOW (ref >60)
Glucose, Bld: 121 mg/dL — ABNORMAL HIGH (ref 70–99)
Potassium: 4.5 mmol/L (ref 3.5–5.1)
Sodium: 133 mmol/L — ABNORMAL LOW (ref 135–145)

## 2020-08-06 LAB — CULTURE, BLOOD (ROUTINE X 2)
Culture: NO GROWTH
Culture: NO GROWTH
Special Requests: ADEQUATE
Special Requests: ADEQUATE

## 2020-08-06 MED ORDER — METOPROLOL TARTRATE 50 MG PO TABS
50.0000 mg | ORAL_TABLET | Freq: Two times a day (BID) | ORAL | Status: DC
  Administered 2020-08-06 – 2020-08-23 (×35): 50 mg
  Filled 2020-08-06 (×36): qty 1

## 2020-08-06 MED ORDER — DOCUSATE SODIUM 50 MG/5ML PO LIQD: 100.0000 mg | Freq: Two times a day (BID) | ORAL | Status: DC | PRN

## 2020-08-06 MED ORDER — JEVITY 1.2 CAL PO LIQD
1000.0000 mL | ORAL | Status: DC
  Administered 2020-08-06 – 2020-08-21 (×10): 1000 mL
  Filled 2020-08-06 (×29): qty 1000

## 2020-08-06 MED ORDER — SODIUM CHLORIDE 0.9 % IV SOLN
2.0000 g | INTRAVENOUS | Status: AC
  Administered 2020-08-06 – 2020-08-09 (×4): 2 g via INTRAVENOUS
  Filled 2020-08-06 (×2): qty 2
  Filled 2020-08-06 (×2): qty 20

## 2020-08-06 NOTE — Progress Notes (Signed)
Patient ID: Samantha Paul, female   DOB: 03-Feb-1946, 74 y.o.   MRN: [id number]  Family meeting is scheduled for Saturday 8-14 -21 at 9 AM with patient's husband and 4 sons.  This nurse practitioner will be present and I have secured an interpreter  Dr.Nguyen agrees to be present  No charge  Lorinda Creed NP  Palliative Medicine Team Team Phone # [phone] Pager [phone]

## 2020-08-06 NOTE — Progress Notes (Signed)
Physical Therapy Treatment Patient Details Name: Samantha Paul MRN: [id number] DOB: August 25, 1946 Today's Date: 08/06/2020    History of Present Illness 74 y.o. female with history of atrial fibrillation, HTN, DM and stroke, who presents with acute onset of right gaze deviation, left-sided hemiplegia and dense receptive and expressive aphasia. 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 Pt received tPA and underwent R ICA and MCA thrombectomy on 8/1. Follow-up MRI demonstrating moderate R MCA as well as R ACA and PCA infarcts with trace petechial hemorrhage.    PT Comments    Pt tolerates treatment well other than one instance of coughing on ETT. Pt follows commands with translation of husband only with R side and with increased time. Pt does fatigue quickly and is asleep after return to supine while PT performing PROM. Pt is limited by profound weakness, impaired head and core control, and respiratory deficits. Pt will continue to benefit from PT POC to improve activity tolerance and balance.   Follow Up Recommendations  SNF;LTACH     Equipment Recommendations  Wheelchair (measurements PT);Wheelchair cushion (measurements PT);Hospital bed    Recommendations for Other Services       Precautions / Restrictions Precautions Precautions: Fall Precaution Comments: intubated, ETT, dense Lt hemiplegic with Rt gaze preference  Restrictions Weight Bearing Restrictions: No    Mobility  Bed Mobility Overal bed mobility: Needs Assistance Bed Mobility: Supine to Sit;Sit to Supine     Supine to sit: Total assist Sit to supine: Total assist      Transfers                    Ambulation/Gait                 Stairs             Wheelchair Mobility    Modified Rankin (Stroke Patients Only) Modified Rankin (Stroke Patients Only) Pre-Morbid Rankin Score: No symptoms Modified Rankin: Severe disability      Balance Overall balance assessment: Needs assistance Sitting-balance support: Feet unsupported;Bilateral upper extremity supported Sitting balance-Leahy Scale: Zero Sitting balance - Comments: max-totalA for sitting balance, posterior lean Postural control: Posterior lean                                  Cognition Arousal/Alertness: Lethargic Behavior During Therapy: Flat affect Overall Cognitive Status: Impaired/Different from baseline Area of Impairment: Attention;Following commands                   Current Attention Level: Focused   Following Commands: Follows one step commands with increased time       General Comments: Pt will follow one step simple motor commands on her Rt side with a slight delay.  She follows no commands on her Lt.       Exercises General Exercises - Lower Extremity Long Arc Quad: AROM;Right;10 reps Other Exercises Other Exercises: PROM LUE shoulder flexion/extension, elbow flexion/extension, wrist flex/ext and digit flex/ext for 10 reps Other Exercises: LLE PROM hip flexion/abd/add, knee flex/ext, ankle PF/DF 10 reps    General Comments General comments (skin integrity, edema, etc.): pt on 30% FiO2 on vent, VSS, A-fib noted on monitor in low 100s      Pertinent Vitals/Pain Pain Assessment: Faces Faces Pain Scale: Hurts little more Pain Location: generalized with cough Pain Descriptors / Indicators: Grimacing Pain  Intervention(s): Monitored during session    Home Living                      Prior Function            PT Goals (current goals can now be found in the care plan section) Acute Rehab PT Goals Patient Stated Goal: To improve mobility quality and reduce caregiver burden. -PT goal, pt unable to verbalize or write this session Progress towards PT goals: Not progressing toward goals - comment    Frequency    Min 2X/week      PT Plan Current plan remains appropriate    Co-evaluation               AM-PAC PT "6 Clicks" Mobility   Outcome Measure  Help needed turning from your back to your side while in a flat bed without using bedrails?: Total Help needed moving from lying on your back to sitting on the side of a flat bed without using bedrails?: Total Help needed moving to and from a bed to a chair (including a wheelchair)?: Total Help needed standing up from a chair using your arms (e.g., wheelchair or bedside chair)?: Total Help needed to walk in hospital room?: Total Help needed climbing 3-5 steps with a railing? : Total 6 Click Score: 6    End of Session Equipment Utilized During Treatment: Oxygen Activity Tolerance: Patient tolerated treatment well Patient left: in bed;with call bell/phone within reach;with bed alarm set;with restraints reapplied;with family/visitor present Nurse Communication: Mobility status;Need for lift equipment PT Visit Diagnosis: Hemiplegia and hemiparesis;Difficulty in walking, not elsewhere classified (R26.2) Hemiplegia - Right/Left: Left Hemiplegia - dominant/non-dominant: Non-dominant Hemiplegia - caused by: Cerebral infarction     Time: 2376-2831 PT Time Calculation (min) (ACUTE ONLY): 24 min  Charges:  $Therapeutic Exercise: 8-22 mins $Therapeutic Activity: 8-22 mins                     Arlyss Gandy, PT, DPT Acute Rehabilitation Pager: [phone]    Arlyss Gandy 08/06/2020, 6:05 PM

## 2020-08-06 NOTE — Progress Notes (Signed)
Left upper extremity venous duplex has been completed. Preliminary results can be found in CV Proc through chart review.  Results were given to the patient's nurse, Susie.  08/06/20 12:27 PM Olen Cordial RVT

## 2020-08-06 NOTE — Progress Notes (Signed)
Nutrition Follow-up  DOCUMENTATION CODES:   Not applicable  INTERVENTION:   Tube feeding via Cortrak tube: Jevity 1.2 at 50 ml/h (1200 ml per day) Prosource TF 45 ml daily  Provides 1480 kcal, 77 gm protein, 973 ml free water daily   NUTRITION DIAGNOSIS:   Inadequate oral intake related to inability to eat as evidenced by NPO status. Ongoing.   GOAL:   Patient will meet greater than or equal to 90% of their needs Met with TF  MONITOR:   Vent status, TF tolerance  REASON FOR ASSESSMENT:   Consult, Ventilator Enteral/tube feeding initiation and management  ASSESSMENT:   Pt with PMH of HTN, afib, DM admitted with R MCA stroke s/p tPA and thrombectomy.   Pt discussed during ICU rounds and with RN.  Meeting with Palliative on Saturday    8/4 cortrak placed; tip gastric  Patient is currently intubated on ventilator support MV: 7.7 L/min Temp (24hrs), Avg:98.6 F (37 C), Min:98.2 F (36.8 C), Max:99.4 F (37.4 C)  Medications reviewed and include: colace, SSI, 5 units novolog every 4 hours, 22 units lantus daily  Labs reviewed:  CBG's: 98-161  Current TF:  Osmolite 1.2 at 50 ml/h  Prosource TF 45 ml daily Provides 1480 kcal, 77 gm protein  Diet Order:   Diet Order            Diet NPO time specified  Diet effective now                 EDUCATION NEEDS:   No education needs have been identified at this time  Skin:  Skin Assessment: Reviewed RN Assessment  Last BM:  8/11 type 7  Height:   Ht Readings from Last 1 Encounters:  07/26/20 5' 2"  (1.575 m)    Weight:   Wt Readings from Last 1 Encounters:  08/06/20 69.8 kg    Ideal Body Weight:  50 kg  BMI:  Body mass index is 28.15 kg/m.  Estimated Nutritional Needs:   Kcal:  1500  Protein:  75-90 grams  Fluid:  >1.5 L/day  Lockie Pares., RD, LDN, CNSC See AMiON for contact information

## 2020-08-06 NOTE — Progress Notes (Signed)
STROKE TEAM PROGRESS NOTE   INTERVAL HISTORY No family is at bedside. Pt still intubated, on weaning trials.  Still lethargic, however able to open eyes on voice, no significant neurological changes.  No DVT on upper extremity.  Palliative care has scheduled family meeting on Saturday morning for goals of care discussion again.   Vitals:   08/06/20 1200 08/06/20 1513 08/06/20 1600 08/06/20 1700  BP: (!) 146/86  (!) 148/89 (!) 143/83  Pulse: 94  (!) 121 (!) 118  Resp: (!) 22  19 (!) 23  Temp: 99.2 F (37.3 C)     TempSrc: Axillary     SpO2: 100% 100% 100% 100%  Weight:      Height:       CBC:  Recent Labs  Lab 08/05/20 0628 08/05/20 0628 08/05/20 1136 08/06/20 0929  WBC 22.7*  --   --  20.6*  HGB 8.2*   < > 10.9* 8.2*  HCT 28.0*   < > 32.0* 26.9*  MCV 68.8*  --   --  69.0*  PLT 318  --   --  327   < > = values in this interval not displayed.   Basic Metabolic Panel:  Recent Labs  Lab 08/05/20 0628 08/05/20 0628 08/05/20 1136 08/06/20 0929  NA 131*   < > 134* 133*  K 4.6   < > 4.5 4.5  CL 97*  --   --  100  CO2 21*  --   --  21*  GLUCOSE 167*  --   --  121*  BUN 30*  --   --  32*  CREATININE 1.49*  --   --  1.40*  CALCIUM 8.3*  --   --  8.1*   < > = values in this interval not displayed.   Lipid Panel:  No results for input(s): CHOL, TRIG, HDL, CHOLHDL, VLDL, LDLCALC in the last 168 hours. Lab Results  Component Value Date   HGBA1C 8.7 (H) 07/27/2020   Urine Drug Screen:  No results for input(s): LABOPIA, COCAINSCRNUR, LABBENZ, AMPHETMU, THCU, LABBARB in the last 168 hours.  Alcohol Level  No results for input(s): ETH in the last 168 hours.  IMAGING past 24 hours VAS US UPPER EXTREMITY VENOUS DUPLEX  Result Date: 08/06/2020 UPPER VENOUS STUDY  Indications: Edema Risk Factors: None identified. Limitations: Body habitus, poor ultrasound/tissue interface and patient positioning, patient movement. Comparison Study: No prior studies. Performing Technologist:  Chanda BusingGregory Collins RVT  Examination Guidelines: A complete evaluation includes B-mode imaging, spectral Doppler, color Doppler, and power Doppler as needed of all accessible portions of each vessel. Bilateral testing is considered an integral part of a complete examination. Limited examinations for reoccurring indications may be performed as noted.  Right Findings: +----------+------------+---------+-----------+----------+-------+ RIGHT     CompressiblePhasicitySpontaneousPropertiesSummary +----------+------------+---------+-----------+----------+-------+ Subclavian    Full       Yes       Yes                      +----------+------------+---------+-----------+----------+-------+  Left Findings: +----------+------------+---------+-----------+----------+-------+ LEFT      CompressiblePhasicitySpontaneousPropertiesSummary +----------+------------+---------+-----------+----------+-------+ IJV           Full       Yes       Yes                      +----------+------------+---------+-----------+----------+-------+ Subclavian    Full       Yes       Yes                      +----------+------------+---------+-----------+----------+-------+  Axillary      Full       Yes       Yes                      +----------+------------+---------+-----------+----------+-------+ Brachial      Full       Yes       Yes                      +----------+------------+---------+-----------+----------+-------+ Radial        Full                                          +----------+------------+---------+-----------+----------+-------+ Ulnar         Full                                          +----------+------------+---------+-----------+----------+-------+ Cephalic      None                                   Acute  +----------+------------+---------+-----------+----------+-------+ Basilic       Full                                           +----------+------------+---------+-----------+----------+-------+  Summary:  Right: No evidence of thrombosis in the subclavian.  Left: No evidence of deep vein thrombosis in the upper extremity. Findings consistent with acute superficial vein thrombosis involving the left cephalic vein.  *See table(s) above for measurements and observations.    Preliminary     PHYSICAL EXAM  Temp:  [98.2 F (36.8 C)-99.4 F (37.4 C)] 99.2 F (37.3 C) (08/12 1200) Pulse Rate:  [55-121] 118 (08/12 1700) Resp:  [17-23] 23 (08/12 1700) BP: (109-148)/(61-89) 143/83 (08/12 1700) SpO2:  [100 %] 100 % (08/12 1700) FiO2 (%):  [40 %] 40 % (08/12 1513) Weight:  [69.8 kg] 69.8 kg (08/12 0500)  General - Well nourished, well developed, intubated off sedation.  Ophthalmologic - fundi not visualized due to noncooperation.  Cardiovascular - irregularly irregular heart rate and rhythm.  Neuro - intubated off sedation, eyes closed initially but easily open with voice and maintained opening.  Able to follow limited midline and peripheral commands, such as close eyes and wiggle toes, showing fingers on the right hand.  Eyes in right gaze position, barely cross midline. Not blinking to visual threat on the left but blinking on the right, doll's eyes present but not cross midline, PERRL. Corneal reflex present, gag and cough present. Breathing over the vent.  Facial symmetry not able to test due to ET tube.  Tongue protrusion not cooperative. On pain stimulation, active movement of RUE and RLE with RUE at least 4/5 and RLE at least 3-/5. LUE 0/5 and LLE 2-/5 withdraw on pain. DTR 1+ and bilateral positive babinski. Sensation not cooperative but brisk response to painful stimulation, coordination not cooperative and gait not tested.   ASSESSMENT/PLAN Samantha Paul is a 74 y.o. female with history of atrial fibrillation, HTN, DM and stroke presenting with right gaze deviation, left-sided hemiplegia and dense receptive and  expressive aphasia. She received tPA 07/26/2020 at 1259. Taken to IR for R ICA T occlusion.  Stroke:   R MCA and ACA infarct due to R tICA occlusion s/p tPA and IR w/ TICI2c-3, embolic secondary to known AF but not on AC for at least a week  Code Stroke CT head No acute abnormality.     CTA head & neck R tICA occlusion w/ min MCA flow. Some narrowing B VA and BA.   CT perfusion R frontal core 16 cc with penumbra 136 R MCA territory  Cerebral angio RT ICA T occlusion ->TICI 2C revascularization of RT MCA and a TICI 3 revascularization of RT ACA .  CT head post IR - new R frontal lobe hyper and hypointensity (infarct, edema, petechial hemorrhage)  MRI right MCA and ACA multifocal large infarcts with petechial hemo  CT repeat stable right MCA multifocal infarcts, minimal MLS.  2D Echo 07/03/2020 EF 65-70%. No source of embolus   LDL 90  HgbA1c 12.7  VTE prophylaxis - heparin subq   Eliquis (apixaban) daily prior to admission, now on aspirin 81 mg. Hold AC for now given petechial hemorrhage, large infarct and potential procedure.  Therapy recommendations: Pending  Disposition:  Pending - full code for now and family meeting on Saturday morning  Acute Respiratory Failure  Secondary to stroke  Intubated for IR, left intubated post IR   off sedation   Still on vent, on weaning  Again not candidate for extubation today  CCM on board  Atrial Fibrillation w/ RVR  Home anticoagulation:  Eliquis (apixaban) daily - not on for at least a week  On diltiazem 240, metoprolol 12.5 bid  PTA  Now on diltiazem 60 mg Q6 -> 90 Q6  metoprolol 50 mg Bid -> 100 bid -> 50 bid . No AC at this time given petechial hemorrhage, large infarct and potential procedure   Hypotension  Home meds:  None, no hx HTN . BP goal < 160 now . BP stable . On metoprolol 100 bid -> 50 bid . Long-term BP goal normotensive  Hyperlipidemia  Home meds:  crestor 20, resumed in hospital  LDL 90, goal <  70  Continue statin at discharge  Diabetes type II Uncontrolled  Home meds:  lantus 20 daily, metformin 500 bid  HgbA1c 12.7, goal < 7.0  CBGs  SSI  Hypoglycemia much improved  DM coordinator assistance appreciated  Continue lantus 20->22, novolog 5U Q4h  Dysphagia . Secondary to stroke . NPO . Has cortrak . On TF @50  . Speech on board   Fever, leukocytosis  Tmax 102.3->101.2->100.6->101.6->101->afebrile  CXR unremarkable  UA WBC 21-50  U Cx insignificant growth  B Cx NGTD  Respiratory culture - klebsiella pneumo (pan sensitive)  WBC 14.7->20.6->26.7->22.8->25.3->21.8->22.7  zosyn 8/7>>8/9  Rocephin 8/9>>  AKI  creatinine - 0.76->1.40->1.56->1.76->1.56->1.49-> 1.40  On TF @ 50 + Pro Source 45 ml daily  Off IVF  Continue BMP monitoring  CCM on board  Hyponatremia, improving  Na 134->133->126->128->133->134->133  Partially due to hyperglycemia  Close monitoring  Hx of stroke  06/2018 left pontine infarct likely secondary to small vessel disease source. CTA head and neck neg. EF 65-70%. LDL 140 and A1C 12.3. DAPT x 3 weeks and lipitor 40.  Other Stroke Risk Factors  Advanced age  Other Active Problems  Microcytic anemia 10.2->9.4->9.3->8.8->8.7->8.5->8.2  Code status - Full code  Hospital day # 11  This patient is critically ill due to right large MCA stroke with petechial hemorrhage,  hyperglycemia, uncontrolled diabetes, AKI, acute blood loss anemia respiratory failure, fever and leukocytosis A. fib RVR and at significant risk of neurological worsening, death form recurrent stroke, hemorrhagic conversion, heart failure, seizure, aspiration pneumonia, renal failure, sepsis. This patient's care requires constant monitoring of vital signs, hemodynamics, respiratory and cardiac monitoring, review of multiple databases, neurological assessment, discussion with family, other specialists and medical decision making of high complexity. I spent  35 minutes of neurocritical care time in the care of this patient.   Marvel Plan, MD PhD Stroke Neurology 08/06/2020 6:01 PM    To contact Stroke Continuity provider, please refer to [email or web address]. After hours, contact General Neurology

## 2020-08-06 NOTE — Progress Notes (Signed)
NAMEEstrellita Paul, MRN:  [id number], DOB:  1946/09/22, LOS: 11 ADMISSION DATE:07/26/2020, CONSULTATION DATE: 07/26/20 REFERRING MD: Dr. Corliss Skains, CHIEF COMPLAINT:L sided weakness   Brief History   Samantha Paul is a 74 year old lady with past medical history of A. fib, hypertension, diabetes, who was admitted for acute onset of right gaze deviation, left side hemiplegia and expressive aphasia was found to have a right ACA/MCA occlusion status post IR revascularization.  She was admitted to the ICU for mechanical ventilation.   Past Medical History  Atrial fibrillation Hypertension Diabetes  Significant Hospital Events   8/1: Admit with right MCA/ACA occlusion status post revascularization 8/2: Hypotensive phenylephrine IV started  Consults:  PCCM Neurology  Procedures:  8/1 >>ETT  8/1 >>L Radial Aline  Significant Diagnostic Tests:  7/9 echocardiogram shows EF of 60 to 70%, narrow LVOT, mild LVH, normal right ventricle systolic function 8/1 CT head showed no acute finding 8/1 CTA showed Embolic occlusion at the right carotid terminus. Minimal flow seen in the right MCA territory. Right ACA territory appears normally perfused, probably due to a patent anterior communicating artery. 8/2 brain MRI showed multifocal ischemic infarction within the right MCA territory. Additional cortical ischemia within the right ACA territory and right PCA territory. Large area of infarction in the right basal ganglia with petechial hemorrhage versus contrast staining. Punctate focus of acute ischemia in the left occipital lobe. 8/7 CT head showed evolving acute right MCA infarct. Low-density edema infarction is more apparent now in the insula and frontal lobe and basal ganglia. There is progressive low-density in the head of the caudate which did show infarct on prior diffusion imaging. Small amount of hemorrhage in the right basal ganglia is unchanged from the prior CT.  Micro Data:  8/9 urine  culture looks infected 8/10 sputum culture shows rare Klebsiella 8/7 blood culture no growth on day 3 Antimicrobials:  Zosyn 8/7 >> 8/9 Ceftriaxone 8/9 >>  Interim history/subjective:  Patient is awake and opening eyes.  She is able to move her right arm and leg.  Still unable to move her left arm and leg.  Not much improvement noted.  Meeting with family on Saturday to discuss goals of care  Objective   Blood pressure 118/61, pulse 61, temperature 98.6 F (37 C), temperature source Axillary, resp. rate 20, height 5\' 2"  (1.575 m), weight 69.8 kg, SpO2 100 %.    Vent Mode: PRVC FiO2 (%):  [40 %] 40 % Set Rate:  [16 bmp] 16 bmp Vt Set:  [400 mL] 400 mL PEEP:  [5 cmH20] 5 cmH20 Pressure Support:  [10 cmH20] 10 cmH20 Plateau Pressure:  [14 cmH20-15 cmH20] 15 cmH20   Intake/Output Summary (Last 24 hours) at 08/06/2020 0718 Last data filed at 08/06/2020 0700 Gross per 24 hour  Intake 1715.97 ml  Output 1325 ml  Net 390.97 ml   Filed Weights   08/04/20 0500 08/05/20 0341 08/06/20 0500  Weight: 69 kg 70.2 kg 69.8 kg    Physical Exam Constitutional:      General: She is not in acute distress.    Comments: Awake and able to follow simple command.  \ HENT:     Head: Normocephalic.  Eyes: PERRLA    General: No scleral icterus.       Right eye: No discharge.        Left eye: No discharge.     Conjunctiva/sclera: Conjunctivae normal.     Pupils: Pupils are equal, round, and reactive to light.  Cardiovascular:  Rate and Rhythm: Normal rate. Rhythm irregular.     Heart sounds: No murmur heard.  Pulmonary:     Comments: No acute distress.  Normal lungs sound Abdominal:     General: There is no distension.     Palpations: Abdomen is soft.     Tenderness: There is no abdominal tenderness.  Musculoskeletal:     Right lower leg: No edema.     Left lower leg: No edema.     Left hand: Edematous  Skin:    General: Skin is warm.     Coloration: Skin is not jaundiced.   Neurological:     Comments: PERRLA Able to follow simple command Able to move right arm against gravity Able to wiggle right toes Unable to move left hand or left leg  Resolved Hospital Problem list     Assessment & Plan:  R ACA / MCA Occlusion / Acute CVA Acute respiratory failure requiring mechanical ventilation in the setting of CVA Atrial fibrillation Microcytic anemia Klebsiella pneumonia Type 2 diabetes AKI Leukocytosis Hyponatremia  Plan:  Acute CVA Status post IR revascularization.  CT 8/7 shows evolving right MCA infarct with small amount of hemorrhage in the right basal ganglia which is unchanged.  Neurology is following the patient.    Not much improvement noted.  Palliative care was consulted and family would like to continue full scope treatment.  Meeting with family including husband and 4 sons for Saturday to discuss goals of care -Appreciate neurology recommendations -Continue aspirin 81 mg -Continue statin   Acute respiratory failure requiring mechanical ventilation In the setting of CVA.  Patient is currently on PS/CPAP and is doing well.  Patient has difficult time to extubate due to mental status.   Continue to attempt SBT as tolerated. If patient cannot get off the vent, she will need a trach and a PEG tube.  Patient is ready to be extubated, however husband would like to wait until Saturday to make the decision with his sons. -Continue ventilator, wean as tolerated -Continue attempt SBT --Check ABG   Atrial fibrillation Chads score of 6.  Currently rate controlled with pulse less than 110.  Patient has few episodes of bradycardia last night so reduce the dose of metoprolol. Patient is not a candidate for anticoagulation at this time due to intracranial hemorrhage.  Telemetry shows A. fib in the room. -Continue diltiazem 60 mg per tube every 6 hours -Reduce metoprolol to 50 mg per tube twice daily.   -Telemetry monitoring   Leukocytosis.    Fever Klebsiella pneumonia Patient had another spike of fever of 100.7 in the last 24 hours.  Continue ceftriaxone for Klebsiella pneumonia  -Continue Ceftriaxone (2/7) -Continue to trend fever and WBC   AKI Baseline creatinine 0.9. FeNa of 1.6% indicated intrinsic.  Continue trending creatinine.  No fluids at this time -BMP daily    Hyponatremia secondary to SIADH Urine osmolarity of 602 and urine sodium of 81 consistent with SIADH.  We will hold fluids and restrict water intake. -BMP daily   Diabetes Hemoglobin A1c of 8.7  -Increase Lantus 22 units daily -Continue NovoLog 5 units every 4 hours -SSI -Monitor CBG   Best practice:  Diet:  Tube feed Pain/Anxiety/Delirium protocol (if indicated): Fentanyl VAP protocol (if indicated): Yes DVT prophylaxis: Heparin GI prophylaxis: Protonix glucose control: Lantus, NovoLog and SSI Mobility: Bedrest Code Status: Full code Family Communication: Per family Disposition: ICU for ventilator   Critical care time: 20 minutes     Samantha Paul  Cyndie Chime, DO Internal Medicine Residency My pager: [phone]

## 2020-08-07 DIAGNOSIS — J9601 Acute respiratory failure with hypoxia: Secondary | ICD-10-CM | POA: Diagnosis not present

## 2020-08-07 LAB — CBC
HCT: 29.7 % — ABNORMAL LOW (ref 36.0–46.0)
Hemoglobin: 8.8 g/dL — ABNORMAL LOW (ref 12.0–15.0)
MCH: 20.7 pg — ABNORMAL LOW (ref 26.0–34.0)
MCHC: 29.6 g/dL — ABNORMAL LOW (ref 30.0–36.0)
MCV: 69.7 fL — ABNORMAL LOW (ref 80.0–100.0)
Platelets: 379 10*3/uL (ref 150–400)
RBC: 4.26 MIL/uL (ref 3.87–5.11)
RDW: 16.7 % — ABNORMAL HIGH (ref 11.5–15.5)
WBC: 19.2 10*3/uL — ABNORMAL HIGH (ref 4.0–10.5)
nRBC: 0.1 % (ref 0.0–0.2)

## 2020-08-07 LAB — BASIC METABOLIC PANEL
Anion gap: 11 (ref 5–15)
BUN: 31 mg/dL — ABNORMAL HIGH (ref 8–23)
CO2: 22 mmol/L (ref 22–32)
Calcium: 8.4 mg/dL — ABNORMAL LOW (ref 8.9–10.3)
Chloride: 100 mmol/L (ref 98–111)
Creatinine, Ser: 1.31 mg/dL — ABNORMAL HIGH (ref 0.44–1.00)
GFR calc Af Amer: 47 mL/min — ABNORMAL LOW (ref >60)
GFR calc non Af Amer: 41 mL/min — ABNORMAL LOW (ref >60)
Glucose, Bld: 161 mg/dL — ABNORMAL HIGH (ref 70–99)
Potassium: 4.6 mmol/L (ref 3.5–5.1)
Sodium: 133 mmol/L — ABNORMAL LOW (ref 135–145)

## 2020-08-07 LAB — GLUCOSE, CAPILLARY
Glucose-Capillary: 156 mg/dL — ABNORMAL HIGH (ref 70–99)
Glucose-Capillary: 182 mg/dL — ABNORMAL HIGH (ref 70–99)
Glucose-Capillary: 173 mg/dL — ABNORMAL HIGH (ref 70–99)
Glucose-Capillary: 129 mg/dL — ABNORMAL HIGH (ref 70–99)
Glucose-Capillary: 131 mg/dL — ABNORMAL HIGH (ref 70–99)
Glucose-Capillary: 138 mg/dL — ABNORMAL HIGH (ref 70–99)

## 2020-08-07 NOTE — Progress Notes (Signed)
NAMEVerlin Paul, MRN:  [id number], DOB:  09-18-1946, LOS: 12 ADMISSION DATE:07/26/2020, CONSULTATION DATE: 07/26/20 REFERRING MD: Dr. Corliss Skains, CHIEF COMPLAINT:L sided weakness   Brief History   Samantha Paul is a 74 year old lady with past medical history of A. fib, hypertension, diabetes, who was admitted for acute onset of right gaze deviation, left side hemiplegia and expressive aphasia was found to have a right ACA/MCA occlusion status post IR revascularization.  She was admitted to the ICU for mechanical ventilation.   Past Medical History  Atrial fibrillation Hypertension Diabetes  Significant Hospital Events   8/1: Admit with right MCA/ACA occlusion status post revascularization 8/2: Hypotensive phenylephrine IV started  Consults:  PCCM Neurology  Procedures:  8/1 >>ETT  8/1 >>L Radial Aline  Significant Diagnostic Tests:  7/9 echocardiogram shows EF of 60 to 70%, narrow LVOT, mild LVH, normal right ventricle systolic function 8/1 CT head showed no acute finding 8/1 CTA showed Embolic occlusion at the right carotid terminus. Minimal flow seen in the right MCA territory. Right ACA territory appears normally perfused, probably due to a patent anterior communicating artery. 8/2 brain MRI showed multifocal ischemic infarction within the right MCA territory. Additional cortical ischemia within the right ACA territory and right PCA territory. Large area of infarction in the right basal ganglia with petechial hemorrhage versus contrast staining. Punctate focus of acute ischemia in the left occipital lobe. 8/7 CT head showed evolving acute right MCA infarct. Low-density edema infarction is more apparent now in the insula and frontal lobe and basal ganglia. There is progressive low-density in the head of the caudate which did show infarct on prior diffusion imaging. Small amount of hemorrhage in the right basal ganglia is unchanged from the prior CT.  Micro Data:  8/9 urine  culture looks infected 8/10 sputum culture shows rare Klebsiella 8/7 blood culture no growth on day 3 Antimicrobials:  Zosyn 8/7 >> 8/9 Ceftriaxone 8/9 >>  Interim history/subjective:  No overnight event  Patient is seen at bedside with her son today.  She is awake and alert and appears in no acute distress.  She is to move he right arm and leg against gravity.  Spoken to his son about family meeting tomorrow regarding her goals of care.  Her son states that before the stroke patient was to ambulate with no difficulty at home.  Objective   Blood pressure 131/77, pulse 70, temperature 98.1 F (36.7 C), temperature source Oral, resp. rate 14, height 5\' 2"  (1.575 m), weight 69.8 kg, SpO2 100 %.    Vent Mode: PRVC FiO2 (%):  [40 %] 40 % Set Rate:  [16 bmp] 16 bmp Vt Set:  [400 mL] 400 mL PEEP:  [5 cmH20] 5 cmH20 Pressure Support:  [10 cmH20] 10 cmH20 Plateau Pressure:  [13 cmH20-16 cmH20] 13 cmH20   Intake/Output Summary (Last 24 hours) at 08/07/2020 0736 Last data filed at 08/07/2020 0700 Gross per 24 hour  Intake 1534.62 ml  Output 1125 ml  Net 409.62 ml   Filed Weights   08/04/20 0500 08/05/20 0341 08/06/20 0500  Weight: 69 kg 70.2 kg 69.8 kg    Physical Exam Constitutional:      General: She is not in acute distress.    Comments: More awake and alert compared to yesterday HENT:     Head: Normocephalic.  Eyes:     General: No scleral icterus.       Right eye: No discharge.        Left eye: No  discharge.     Conjunctiva/sclera: Conjunctivae normal.  Cardiovascular:     Rate and Rhythm: Normal rate. Rhythm irregular.     Heart sounds: No murmur heard.  Pulmonary:     Comments: No acute distress.  Normal lungs sound Abdominal:     General: There is no distension.     Palpations: Abdomen is soft.     Tenderness: There is no abdominal tenderness.  Musculoskeletal:     Right lower leg: No edema.     Left lower leg: No edema.     Left hand: Edematous  Skin:     General: Skin is warm.     Coloration: Skin is not jaundiced.  Neurological:     Comments:  Able to follow simple command Able to move right arm against gravity Able to flex right knee and hip Unable to move left hand or left leg  Resolved Hospital Problem list     Assessment & Plan:  R ACA / MCA Occlusion / Acute CVA Acute respiratory failure requiring mechanical ventilation in the setting of CVA Atrial fibrillation Microcytic anemia Klebsiella pneumonia Type 2 diabetes AKI Leukocytosis Hyponatremia  Plan:  Acute CVA With petechial hemorrhage Status post IR revascularization.  CT 8/7 shows evolving right MCA infarct with small amount of hemorrhage in the right basal ganglia which is unchanged.  Neurology is following the patient.    Not much improvement noted.  Palliative care was consulted and family would like to continue full scope treatment.  Meeting with family including husband and 4 sons for Saturday to discuss goals of care -Appreciate neurology recommendations -Continue aspirin 81 mg -Continue statin   Acute respiratory failure requiring mechanical ventilation In the setting of CVA.  Patient is currently on PS/CPAP and is doing well.  Patient has difficult time to extubate due to mental status.   Continue to attempt SBT as tolerated. If patient cannot get off the vent, she will need a trach and a PEG tube.  Patient is ready to be extubated, however husband would like to wait until Saturday to make the decision with his sons. -Continue ventilator, wean as tolerated -Continue attempt SBT --Check ABG   Atrial fibrillation Chads score of 6.  Currently rate controlled with pulse less than 110.  Patient is not a candidate for anticoagulation at this time due to intracranial petechial hemorrhage.  Telemetry shows A. fib in the room. -Continue diltiazem 60 mg per tube every 6 hours -Continue metoprolol to 50 mg per tube twice daily.   -Telemetry  monitoring   Leukocytosis.   Fever Klebsiella pneumonia Patient was afebrile in the last 24 hours.  Continue ceftriaxone for Klebsiella pneumonia  -Continue Ceftriaxone (4/7) -Continue to trend fever and WBC   AKI Baseline creatinine 0.9. FeNa of 1.6% indicated intrinsic.  Continue trending creatinine.  No fluids at this time.  Creatinine trending down from 1.4-1.3. -BMP daily    Hyponatremia secondary to SIADH Urine osmolarity of 602 and urine sodium of 81 consistent with SIADH.  We will hold fluids and restrict water intake. -BMP daily   Diabetes Hemoglobin A1c of 8.7  -Increase Lantus 22 units daily -Continue NovoLog 5 units every 4 hours -SSI -Monitor CBG   Best practice:  Diet:  Tube feed Pain/Anxiety/Delirium protocol (if indicated): Fentanyl VAP protocol (if indicated): Yes DVT prophylaxis: Heparin GI prophylaxis: Protonix glucose control: Lantus, NovoLog and SSI Mobility: Bedrest Code Status: Full code Family Communication: Per family Disposition: ICU for ventilator   Critical care time:  20 minutes     Doran Stabler, DO Internal Medicine Residency My pager: [phone]

## 2020-08-07 NOTE — Progress Notes (Signed)
STROKE TEAM PROGRESS NOTE   INTERVAL HISTORY Son is at bedside.  Patient still intubated, on CPAP.  Neuro stable, open eyes on voice, able to maintain eye opening.  Able to follow limited simple commands on the right.  Pending family meeting tomorrow.   Vitals:   08/07/20 1101 08/07/20 1200 08/07/20 1506 08/07/20 1515  BP:  (!) 134/97    Pulse:  76    Resp:  15    Temp:  98.7 F (37.1 C)  99 F (37.2 C)  TempSrc:  Axillary  Axillary  SpO2: 100% 100% 100%   Weight:      Height:       CBC:  Recent Labs  Lab 08/06/20 0929 08/07/20 0619  WBC 20.6* 19.2*  HGB 8.2* 8.8*  HCT 26.9* 29.7*  MCV 69.0* 69.7*  PLT 327 379   Basic Metabolic Panel:  Recent Labs  Lab 08/06/20 0929 08/07/20 0619  NA 133* 133*  K 4.5 4.6  CL 100 100  CO2 21* 22  GLUCOSE 121* 161*  BUN 32* 31*  CREATININE 1.40* 1.31*  CALCIUM 8.1* 8.4*   Lipid Panel:  No results for input(s): CHOL, TRIG, HDL, CHOLHDL, VLDL, LDLCALC in the last 168 hours. Lab Results  Component Value Date   HGBA1C 8.7 (H) 07/27/2020   Urine Drug Screen:  No results for input(s): LABOPIA, COCAINSCRNUR, LABBENZ, AMPHETMU, THCU, LABBARB in the last 168 hours.  Alcohol Level  No results for input(s): ETH in the last 168 hours.  IMAGING past 24 hours No results found.  PHYSICAL EXAM  Temp:  [97.6 F (36.4 C)-99 F (37.2 C)] 99 F (37.2 C) (08/13 1515) Pulse Rate:  [42-110] 76 (08/13 1200) Resp:  [14-25] 15 (08/13 1200) BP: (112-147)/(59-97) 134/97 (08/13 1200) SpO2:  [100 %] 100 % (08/13 1506) FiO2 (%):  [30 %-40 %] 30 % (08/13 1506)  General - Well nourished, well developed, intubated off sedation.  Ophthalmologic - fundi not visualized due to noncooperation.  Cardiovascular - irregularly irregular heart rate and rhythm.  Neuro - intubated off sedation, eyes closed initially but easily open with voice and maintained opening.  Able to follow limited midline and peripheral commands on the right, such as close eyes  and wiggle toes, showing fingers on the right hand.  Eyes in right gaze position, barely cross midline. Not blinking to visual threat on the left but blinking on the right, doll's eyes present but not cross midline, PERRL. Corneal reflex present, gag and cough present. Breathing over the vent.  Facial symmetry not able to test due to ET tube.  Tongue protrusion not cooperative. On pain stimulation, active movement of RUE and RLE with RUE at least 4/5 and RLE at least 3-/5. LUE 0/5 and LLE 2-/5 withdraw on pain. DTR 1+ and bilateral positive babinski. Sensation not cooperative but brisk response to painful stimulation, coordination not cooperative and gait not tested.   ASSESSMENT/PLAN [name] is a 74 y.o. female with history of atrial fibrillation, HTN, DM and stroke presenting with right gaze deviation, left-sided hemiplegia and dense receptive and expressive aphasia. She received tPA 07/26/2020 at 1259. Taken to IR for R ICA T occlusion.  Stroke:   R MCA and ACA infarct due to R tICA occlusion s/p tPA and IR w/ TICI2c-3, embolic secondary to known AF but not on AC for at least a week  Code Stroke CT head No acute abnormality.     CTA head & neck R tICA occlusion w/ min  MCA flow. Some narrowing B VA and BA.   CT perfusion R frontal core 16 cc with penumbra 136 R MCA territory  Cerebral angio RT ICA T occlusion ->TICI 2C revascularization of RT MCA and a TICI 3 revascularization of RT ACA .  CT head post IR - new R frontal lobe hyper and hypointensity (infarct, edema, petechial hemorrhage)  MRI right MCA and ACA multifocal large infarcts with petechial hemo  CT repeat stable right MCA multifocal infarcts, minimal MLS.  2D Echo 07/03/2020 EF 65-70%. No source of embolus   LDL 90  HgbA1c 12.7  VTE prophylaxis - heparin subq   Eliquis (apixaban) daily prior to admission, now on aspirin 81 mg. Hold AC for now given petechial hemorrhage, large infarct and potential procedure.  Therapy  recommendations: Pending  Disposition:  Pending - full code for now and family meeting tomorrow morning  Acute Respiratory Failure  Secondary to stroke  Intubated for IR, left intubated post IR   off sedation   Still on vent, on weaning protocol  CCM on board  Atrial Fibrillation w/ RVR  Home anticoagulation:  Eliquis (apixaban) daily - not on for at least a week  On diltiazem 240, metoprolol 12.5 bid  PTA  Now on diltiazem 60 mg Q6 -> 90 Q6  metoprolol 50 mg Bid -> 100 bid -> 50 bid . No AC at this time given petechial hemorrhage, large infarct and potential procedure   Hypotension  Home meds:  None, no hx HTN . BP goal < 160 now . BP stable now . On metoprolol 100 bid -> 50 bid . Long-term BP goal normotensive  Hyperlipidemia  Home meds:  crestor 20, resumed in hospital  LDL 90, goal < 70  Continue statin at discharge  Diabetes type II Uncontrolled  Home meds:  lantus 20 daily, metformin 500 bid  HgbA1c 12.7, goal < 7.0  CBGs  SSI  Hypoglycemia much improved  DM coordinator assistance appreciated  Continue lantus 20->22, novolog 5U Q4h  Dysphagia . Secondary to stroke . NPO . Has cortrak . On TF @50  . Speech on board   Fever, leukocytosis  Tmax 102.3->101.2->100.6->101.6->101->afebrile  CXR unremarkable  UA WBC 21-50  U Cx insignificant growth  B Cx NGTD  Respiratory culture - klebsiella pneumo (pan sensitive)  WBC 14.7->20.6->26.7->22.8->25.3->21.8->22.7-> 19.2  zosyn 8/7>>8/9  Rocephin 8/9>>  AKI  creatinine - 0.76->1.40->1.56->1.76->1.56->1.49-> 1.40-> 1.31  On TF @ 50 + Pro Source 45 ml daily  Off IVF  Continue BMP monitoring  CCM on board  Hyponatremia, improving  Na 134->133->126->128->133->134->133  Partially due to hyperglycemia  Close monitoring  Hx of stroke  06/2018 left pontine infarct likely secondary to small vessel disease source. CTA head and neck neg. EF 65-70%. LDL 140 and A1C 12.3. DAPT x 3  weeks and lipitor 40.  Other Stroke Risk Factors  Advanced age  Other Active Problems  Microcytic anemia 10.2->9.4->9.3->8.8->8.7->8.5->8.2-> 8.8  Code status - Full code  Hospital day # 12  This patient is critically ill due to right large MCA infarct with PQ hemorrhage, respiratory failure needing ventilation support, hyperglycemia due to uncontrolled diabetes, A. fib RVR, fever and leukocytosis and at significant risk of neurological worsening, death form recurrent stroke, cerebral edema, brain herniation, hemorrhagic conversion, seizure, renal failure, heart failure, sepsis. This patient's care requires constant monitoring of vital signs, hemodynamics, respiratory and cardiac monitoring, review of multiple databases, neurological assessment, discussion with family, other specialists and medical decision making of high complexity. I spent  30 minutes of neurocritical care time in the care of this patient. I had long discussion with son at bedside, updated pt current condition, treatment plan and potential prognosis, and answered all the questions. He expressed understanding and appreciation.    Marvel Plan, MD PhD Stroke Neurology 08/07/2020 6:32 PM    To contact Stroke Continuity provider, please refer to [email or web address]. After hours, contact General Neurology

## 2020-08-08 DIAGNOSIS — J9601 Acute respiratory failure with hypoxia: Secondary | ICD-10-CM | POA: Diagnosis not present

## 2020-08-08 LAB — CBC
HCT: 26.8 % — ABNORMAL LOW (ref 36.0–46.0)
Hemoglobin: 7.9 g/dL — ABNORMAL LOW (ref 12.0–15.0)
MCH: 20.5 pg — ABNORMAL LOW (ref 26.0–34.0)
MCHC: 29.5 g/dL — ABNORMAL LOW (ref 30.0–36.0)
MCV: 69.4 fL — ABNORMAL LOW (ref 80.0–100.0)
Platelets: 401 10*3/uL — ABNORMAL HIGH (ref 150–400)
RBC: 3.86 MIL/uL — ABNORMAL LOW (ref 3.87–5.11)
RDW: 16.7 % — ABNORMAL HIGH (ref 11.5–15.5)
WBC: 18.9 10*3/uL — ABNORMAL HIGH (ref 4.0–10.5)
nRBC: 0.1 % (ref 0.0–0.2)

## 2020-08-08 LAB — BASIC METABOLIC PANEL
Anion gap: 10 (ref 5–15)
BUN: 32 mg/dL — ABNORMAL HIGH (ref 8–23)
CO2: 24 mmol/L (ref 22–32)
Calcium: 8.2 mg/dL — ABNORMAL LOW (ref 8.9–10.3)
Chloride: 97 mmol/L — ABNORMAL LOW (ref 98–111)
Creatinine, Ser: 1.25 mg/dL — ABNORMAL HIGH (ref 0.44–1.00)
GFR calc Af Amer: 50 mL/min — ABNORMAL LOW (ref >60)
GFR calc non Af Amer: 43 mL/min — ABNORMAL LOW (ref >60)
Glucose, Bld: 140 mg/dL — ABNORMAL HIGH (ref 70–99)
Potassium: 4.8 mmol/L (ref 3.5–5.1)
Sodium: 131 mmol/L — ABNORMAL LOW (ref 135–145)

## 2020-08-08 LAB — GLUCOSE, CAPILLARY
Glucose-Capillary: 123 mg/dL — ABNORMAL HIGH (ref 70–99)
Glucose-Capillary: 176 mg/dL — ABNORMAL HIGH (ref 70–99)
Glucose-Capillary: 117 mg/dL — ABNORMAL HIGH (ref 70–99)
Glucose-Capillary: 174 mg/dL — ABNORMAL HIGH (ref 70–99)
Glucose-Capillary: 144 mg/dL — ABNORMAL HIGH (ref 70–99)
Glucose-Capillary: 160 mg/dL — ABNORMAL HIGH (ref 70–99)

## 2020-08-08 MED ORDER — CHLORHEXIDINE GLUCONATE 0.12 % MT SOLN
15.0000 mL | Freq: Two times a day (BID) | OROMUCOSAL | Status: DC
  Administered 2020-08-08 – 2020-08-23 (×28): 15 mL via OROMUCOSAL
  Filled 2020-08-08 (×27): qty 15

## 2020-08-08 MED ORDER — ORAL CARE MOUTH RINSE
15.0000 mL | Freq: Two times a day (BID) | OROMUCOSAL | Status: DC
  Administered 2020-08-08 – 2020-08-23 (×29): 15 mL via OROMUCOSAL

## 2020-08-08 MED ORDER — SODIUM CHLORIDE 0.9 % IV SOLN
510.0000 mg | Freq: Once | INTRAVENOUS | Status: AC
  Administered 2020-08-08: 510 mg via INTRAVENOUS
  Filled 2020-08-08: qty 17

## 2020-08-08 NOTE — Plan of Care (Signed)
°  Problem: Coping: °Goal: Level of anxiety will decrease °Outcome: Progressing °  °

## 2020-08-08 NOTE — Progress Notes (Signed)
STROKE TEAM PROGRESS NOTE   INTERVAL HISTORY Patient's husband and 2 sons are at the bedside.  I just missed the [place] language interpreter who just left.  Patient neurological condition basically has remained unchanged over the last 10 days with persistent left hemiplegia and mild English and she is awake arousable and following commands on the right.  Family has met with palliative care about goals of care and have decided they want to give her a trial of extubation and if she has trouble they are okay with reintubation and trach and PEG.  Vital signs stable.   Vitals:   08/08/20 0000 08/08/20 0338 08/08/20 0400 08/08/20 0500  BP:   (!) 144/103 128/88  Pulse:  90 (!) 108 74  Resp:  (!) 22 (!) 21 (!) 22  Temp: 98.4 F (36.9 C)  97.9 F (36.6 C)   TempSrc: Oral  Oral   SpO2:  100% 100% 100%  Weight:    67.4 kg  Height:       CBC:  Recent Labs  Lab 08/06/20 0929 08/07/20 0619  WBC 20.6* 19.2*  HGB 8.2* 8.8*  HCT 26.9* 29.7*  MCV 69.0* 69.7*  PLT 327 188   Basic Metabolic Panel:  Recent Labs  Lab 08/06/20 0929 08/07/20 0619  NA 133* 133*  K 4.5 4.6  CL 100 100  CO2 21* 22  GLUCOSE 121* 161*  BUN 32* 31*  CREATININE 1.40* 1.31*  CALCIUM 8.1* 8.4*   Lipid Panel:  No results for input(s): CHOL, TRIG, HDL, CHOLHDL, VLDL, LDLCALC in the last 168 hours. Lab Results  Component Value Date   HGBA1C 8.7 (H) 07/27/2020   Urine Drug Screen:  No results for input(s): LABOPIA, COCAINSCRNUR, LABBENZ, AMPHETMU, THCU, LABBARB in the last 168 hours.  Alcohol Level  No results for input(s): ETH in the last 168 hours.  IMAGING past 24 hours No results found.  PHYSICAL EXAM   Temp:  [97.6 F (36.4 C)-99.6 F (37.6 C)] 97.9 F (36.6 C) (08/14 0400) Pulse Rate:  [43-123] 74 (08/14 0500) Resp:  [14-23] 22 (08/14 0500) BP: (124-147)/(65-103) 128/88 (08/14 0500) SpO2:  [100 %] 100 % (08/14 0500) FiO2 (%):  [30 %-40 %] 40 % (08/14 0338) Weight:  [67.4 kg] 67.4 kg (08/14  0500)  General - Well nourished, well developed elderly Asian lady, intubated off sedation.  Ophthalmologic - fundi not visualized due to noncooperation.  Cardiovascular - irregularly irregular heart rate and rhythm.  Neuro - intubated off sedation, eyes closed initially but easily open with voice and maintained opening.  Able to follow limited midline and peripheral commands on the right, such as close eyes and wiggle toes, showing fingers on the right hand.  Eyes in right gaze position, barely cross midline. Not blinking to visual threat on the left but blinking on the right, doll's eyes present but not cross midline, PERRL. Corneal reflex present, gag and cough present. Breathing over the vent.  Facial symmetry not able to test due to ET tube.  Tongue protrusion not cooperative. On pain stimulation, active movement of RUE and RLE with RUE at least 4/5 and RLE at least 3-/5. LUE 0/5 and LLE 2-/5 withdraw on pain. DTR 1+ and bilateral positive babinski. Sensation not cooperative but brisk response to painful stimulation, coordination not cooperative and gait not tested.   ASSESSMENT/PLAN Ms. Raeana Patricelli is a 74 y.o. female with history of atrial fibrillation, HTN, DM and stroke presenting with right gaze deviation, left-sided hemiplegia and dense receptive  and expressive aphasia. She received tPA 07/26/2020 at 1259. Taken to IR for R ICA T occlusion.  Stroke:   R MCA and ACA infarct due to R tICA occlusion s/p tPA and IR w/ [id number], embolic secondary to known AF but not on AC for at least a week  Code Stroke CT head No acute abnormality.     CTA head & neck R tICA occlusion w/ min MCA flow. Some narrowing B VA and BA.   CT perfusion R frontal core 16 cc with penumbra 136 R MCA territory  Cerebral angio RT ICA T occlusion ->TICI 2C revascularization of RT MCA and a TICI 3 revascularization of RT ACA .  CT head post IR - new R frontal lobe hyper and hypointensity (infarct, edema, petechial  hemorrhage)  MRI right MCA and ACA multifocal large infarcts with petechial hemo  CT repeat stable right MCA multifocal infarcts, minimal MLS.  2D Echo 07/03/2020 EF 65-70%. No source of embolus   LDL 90  HgbA1c 12.7  VTE prophylaxis - heparin subq   Eliquis (apixaban) daily prior to admission, now on aspirin 81 mg. Hold AC for now given petechial hemorrhage, large infarct and potential procedure.  Therapy recommendations: Pending  Disposition:  Pending - full code for now and family meeting tomorrow morning  Acute Respiratory Failure  Secondary to stroke  Intubated for IR, left intubated post IR   off sedation   Still on vent, on weaning protocol  CCM on board  Atrial Fibrillation w/ RVR  Home anticoagulation:  Eliquis (apixaban) daily - not on for at least a week  On diltiazem 240, metoprolol 12.5 bid  PTA  Now on diltiazem 60 mg Q6 -> 90 Q6  metoprolol 50 mg Bid -> 100 bid -> 50 bid . No AC at this time given petechial hemorrhage, large infarct and potential procedure   Hypotension  Home meds:  None, no hx HTN . BP goal < 160 now . BP stable now . On metoprolol 100 bid -> 50 bid . Long-term BP goal normotensive  Hyperlipidemia  Home meds:  crestor 20, resumed in hospital  LDL 90, goal < 70  Continue statin at discharge  Diabetes type II Uncontrolled  Home meds:  lantus 20 daily, metformin 500 bid  HgbA1c 12.7, goal < 7.0  CBGs  SSI  Hypoglycemia much improved  DM coordinator assistance appreciated  Continue lantus 20->22, novolog 5U Q4h  Dysphagia . Secondary to stroke . NPO . Has cortrak . On TF _0  . Speech on board   Fever, leukocytosis  Tmax 102.3->101.2->100.6->101.6->101->afebrile  CXR unremarkable  UA WBC 21-50  U Cx insignificant growth  B Cx NGTD  Respiratory culture - klebsiella pneumo (pan sensitive)  WBC 14.7->20.6->26.7->22.8->25.3->21.8->22.7->19.2  Zosyn 8/7>>8/9  Rocephin 8/9>>  AKI  creatinine -  0.76->1.40->1.56->1.76->1.56->1.49-> 1.40->1.31  On TF @ 50 + Pro Source 45 ml daily  Off IVF  Continue BMP monitoring  CCM on board  Hyponatremia, improving  Na 134->133->126->128->133->134->133  Partially due to hyperglycemia  Close monitoring  Hx of stroke  06/2018 left pontine infarct likely secondary to small vessel disease source. CTA head and neck neg. EF 65-70%. LDL 140 and A1C 12.3. DAPT x 3 weeks and lipitor 40.  Other Stroke Risk Factors  Advanced age  Other Active Problems  Microcytic anemia 10.2->9.4->9.3->8.8->8.7->8.5->8.2->8.8   Code status - Full code   Family meeting today 08/08/20 9:00  AM - (Palliative Care and Critical Care on board)  Hospital day # 13 Patient  neurological exam remains unchanged with persistent left hemiplegia and mild neglect and she has now been on prolonged ventilatory support and may meet criteria for extubation.  Family has met with palliative care as well as critical care team and myself and I made the decision to continue full support but would like to give her a trial of extubation and if she fails they want reintubation trach and PEG.  Discussed with critical care team resident and attending and answered questions.discussed with husband and 2 sons also.  This patient is critically ill and at significant risk of neurological worsening, death and care requires constant monitoring of vital signs, hemodynamics,respiratory and cardiac monitoring, extensive review of multiple databases, frequent neurological assessment, discussion with family, other specialists and medical decision making of high complexity.I have made any additions or clarifications directly to the above note.This critical care time does not reflect procedure time, or teaching time or supervisory time of PA/NP/Med Resident etc but could involve care discussion time.  I spent 30 minutes of neurocritical care time  in the care of  this patient.    Antony Contras,  MD     To contact Stroke Continuity provider, please refer to [email or web address] After hours, contact General Neurology

## 2020-08-08 NOTE — Progress Notes (Signed)
Daily Progress Note   Patient Name: Samantha Paul       Date: 08/08/2020 DOB: September 22, 1946  Age: 74 y.o. MRN#: [id number] Attending Physician: Rosalin Hawking, MD Primary Care Physician: Elwyn Reach, MD Admit Date: 07/26/2020   Patient Profile/HPI:  74 y.o.femalewith past medical history of previous stroke, uncontrolled DM2, and atrial fibrillation on Eliquiswho was admitted on 8/1/2021with left sided weakness and expressive aphasia. She was found to have a right sided CVA in her MCA and ACA. She received TPA. She underwent thrombectomy on 8/1. She is currently intubated but weaning. She is arousable with dense left hemiplegia.   Family face treatment option decisions, advanced directive decisions and anticipatory care needs.  Family meting today  Reason for Consultation/Follow-up: To discuss complex medical decision making related to patient's goals of care.    Conversation/Education/GOCs  As scheduled I met with husband/ Y-Yel R'Mah and his four sons along with  interpretor/ Ykeo  c# [phone], language line # [phone] for  continued education regarding current medical situation; diagnosis, prognosis, goals of care, disposition and anticipatory care needs.    Dr Alfonse Spruce present.  Created space and opportunity for husband and sons to explore thoughts and feelings regarding patient's current medical situation.    This is a very difficult situation for this family. They all understand the seriousness of the situation.   Attempted to elicit values and goals of care important to patient and his family.  Culturally this family leans heavily on what ever the medical team is recommending.  Education offered on the difference between an aggressive medical intervention path and a  palliative comfort path for this patient at this time in this situation.  I raised various possible sceanerios, best case vs worse case.  Education offered on trach and peg/risks and benefits. Likely need for long term nursing needs, ongoing left sided weakness.  Questions and concerns answered to the best of my ability  At this time family is open to all offered and available medical interventions to prolong life.  They are hopeful for improvement  They understand that extubation will will attempted today, if re- intbuation is required they are open to trach/peg   Length of Stay: 13   Vital Signs: BP 123/86    Pulse (!) 101    Temp 98.6 F (37 C) (Axillary)  Resp (!) 24    Ht 5' 2"  (1.575 m)    Wt 67.4 kg    SpO2 100%    BMI 27.18 kg/m  SpO2: SpO2: 100 % O2 Device: O2 Device: Ventilator O2 Flow Rate:         Palliative Assessment/Data:  20%     Palliative Care Plan     Code Status:  Full code  Prognosis:  Unable to determine   Discharge Planning: To Be Determined   Discussed with Dr  Alfonse Spruce  Total time spent:  60  min.     Greater than 50%  of this time was spent counseling and coordinating care related to the above assessment and plan.  Wadie Lessen NP  Palliative Medicine Team Team Phone # [phone] Pager [phone]    Please contact Palliative MedicineTeam phone at [phone] for questions and concerns between 7 am - 7 pm.   Please see AMION for individual provider pager numbers.

## 2020-08-08 NOTE — Procedures (Signed)
Extubation Procedure Note  Patient Details:   Name: Rielyn Krupinski DOB: 1946/04/23 MRN: [id number]   Airway Documentation:    Vent end date: 08/08/20 Vent end time: 1015   Evaluation  O2 sats: stable throughout Complications: No apparent complications Patient did tolerate procedure well. Bilateral Breath Sounds: Coarse crackles   Yes   Patient extubated at this time per MD order. MD at bedside. Tolerated well. Currently on 4LNC.  Lurlean Leyden 08/08/2020, 10:17 AM

## 2020-08-08 NOTE — Plan of Care (Signed)
  Problem: Education: Goal: Knowledge of disease or condition will improve Outcome: Progressing   

## 2020-08-08 NOTE — Progress Notes (Signed)
[name], MRN:  [id number], DOB:  1946-10-21, LOS: 13 ADMISSION DATE:07/26/2020, CONSULTATION DATE: 07/26/20 REFERRING MD: Dr. Corliss Skains, CHIEF COMPLAINT:L sided weakness   Brief History   Samantha Paul is a 74 year old lady with past medical history of A. fib, hypertension, diabetes, who was admitted for acute onset of right gaze deviation, left side hemiplegia and expressive aphasia was found to have a right ACA/MCA occlusion status post IR revascularization.  She was admitted to the ICU for mechanical ventilation.   Past Medical History  Atrial fibrillation Hypertension Diabetes  Significant Hospital Events   8/1: Admit with right MCA/ACA occlusion status post revascularization 8/2: Hypotensive phenylephrine IV started  Consults:  PCCM Neurology  Procedures:  8/1 >>ETT  8/1 >>L Radial Aline  Significant Diagnostic Tests:  7/9 echocardiogram shows EF of 60 to 70%, narrow LVOT, mild LVH, normal right ventricle systolic function 8/1 CT head showed no acute finding 8/1 CTA showed Embolic occlusion at the right carotid terminus. Minimal flow seen in the right MCA territory. Right ACA territory appears normally perfused, probably due to a patent anterior communicating artery. 8/2 brain MRI showed multifocal ischemic infarction within the right MCA territory. Additional cortical ischemia within the right ACA territory and right PCA territory. Large area of infarction in the right basal ganglia with petechial hemorrhage versus contrast staining. Punctate focus of acute ischemia in the left occipital lobe. 8/7 CT head showed evolving acute right MCA infarct. Low-density edema infarction is more apparent now in the insula and frontal lobe and basal ganglia. There is progressive low-density in the head of the caudate which did show infarct on prior diffusion imaging. Small amount of hemorrhage in the right basal ganglia is unchanged from the prior CT. 8/13 venous duplex  ultrasound of left upper extremity: Negative for DVT  Micro Data:  8/9 urine culture looks infected 8/10 sputum culture shows rare Klebsiella 8/7 blood culture no growth on day 3\  Antimicrobials:  Zosyn 8/7 >> 8/9 Ceftriaxone 8/9 >>  Interim history/subjective:  No overnight event  Speaking with family and palliative team this morning regarding her goals of care.  We discussed that plan to extubate her today and if patient does not do well, family agrees with reintubation and trach placement.  Interpreter was available.  Everybody voices understanding.  Patient is seen at bedside today.  She is awake and alert follow command.  Attempt extubation today.  Patient is doing well.  Continue to monitor for desaturation or aspiration.  Objective   Blood pressure 128/88, pulse 74, temperature 97.9 F (36.6 C), temperature source Oral, resp. rate (!) 22, height 5\' 2"  (1.575 m), weight 67.4 kg, SpO2 100 %.    Vent Mode: PRVC FiO2 (%):  [30 %-40 %] 40 % Set Rate:  [16 bmp] 16 bmp Vt Set:  [400 mL] 400 mL PEEP:  [5 cmH20] 5 cmH20 Pressure Support:  [10 cmH20] 10 cmH20 Plateau Pressure:  [12 cmH20] 12 cmH20   Intake/Output Summary (Last 24 hours) at 08/08/2020 0744 Last data filed at 08/08/2020 0500 Gross per 24 hour  Intake 1414.97 ml  Output 1850 ml  Net -435.03 ml   Filed Weights   08/05/20 0341 08/06/20 0500 08/08/20 0500  Weight: 70.2 kg 69.8 kg 67.4 kg    Physical Exam Constitutional:      General: She is not in acute distress.    Comments: Alert and awake and able to follow commands. HENT:     Head: Normocephalic.  Eyes:  General: No scleral icterus.       Right eye: No discharge.        Left eye: No discharge.     Conjunctiva/sclera: Conjunctivae normal.  Cardiovascular:     Rate and Rhythm: Normal rate. Rhythm irregular.     Heart sounds: No murmur heard.  Pulmonary:     Comments: No acute distress.  Mild crackles noted bilateral lung bases Abdominal:      General: There is no distension.     Palpations: Abdomen is soft.     Tenderness: There is no abdominal tenderness.  Musculoskeletal:     Right lower leg: No edema.     Left lower leg: No edema.     Left hand: Edematous  Skin:    General: Skin is warm.     Coloration: Skin is not jaundiced.  Neurological:     Comments:  Able to follow simple command Able to move right arm against gravity Able to move right leg  Unable to move head Unable to move left hand or left leg  Resolved Hospital Problem list     Assessment & Plan:  R ACA / MCA Occlusion / Acute CVA Acute respiratory failure requiring mechanical ventilation in the setting of CVA Atrial fibrillation Microcytic anemia Klebsiella pneumonia Type 2 diabetes AKI Leukocytosis Hyponatremia  Plan:  Acute CVA With petechial hemorrhage Status post IR revascularization.  CT 8/7 shows evolving right MCA infarct with small amount of hemorrhage in the right basal ganglia which is unchanged.  Neurology is following the patient.   Attempt extubation today.  Discussed with family regarding goals of care given her left hemiplegic.  We will continue conversation regarding goals of care going forward -Appreciate neurology recommendations -Continue aspirin 81 mg -Continue statin   Acute respiratory failure requiring mechanical ventilation In the setting of CVA.    Extubated today.  Patient currently doing well.  Continue to monitor.  Discussed with family and they agree with reintubation and trach placement if patient will happen to desat or unable to protect her airway  -Continue to monitor respiratory status   Atrial fibrillation Chads score of 6.  Currently rate controlled with pulse less than 110.  Patient is not a candidate for anticoagulation at this time due to intracranial petechial hemorrhage.  Telemetry shows A. fib in the room. -Continue diltiazem 60 mg per tube every 6 hours -Continue metoprolol to 50 mg per tube  twice daily.   -Telemetry monitoring   Leukocytosis.   Fever Klebsiella pneumonia Patient was afebrile in the last 24 hours.  WBC continue to trend down from 19.2-18.9.  Continue ceftriaxone for Klebsiella pneumonia  -Continue Ceftriaxone (5/7) -Continue to trend fever and WBC   AKI Baseline creatinine 0.9. FeNa of 1.6% indicated intrinsic.  Continue trending creatinine.  No fluids at this time.   -Creatinine trending down from 1.4 -1.3 -1.25. -BMP daily    Hyponatremia secondary to SIADH Urine osmolarity of 602 and urine sodium of 81 consistent with SIADH.  We will hold fluids and restrict fluid intake to 1.5 L a day.  -BMP daily   Microcytic anemia Likely secondary to iron deficiency anemia given last iron saturation of 3 in 06/30/2020.  Globin of 7.9 today -Give Feraheme -CBC in a.m.   Diabetes Hemoglobin A1c of 8.7.  CBG within good range -Increase Lantus 22 units daily -Continue NovoLog 5 units every 4 hours -SSI -Monitor CBG   Best practice:  Diet:  N.p.o. Pain/Anxiety/Delirium protocol (if indicated): Fentanyl  VAP protocol (if indicated): Yes DVT prophylaxis: Heparin GI prophylaxis: Protonix glucose control: Lantus, NovoLog and SSI Mobility: Bedrest Code Status: Full code Family Communication: Per family Disposition:  Post extubation care   Critical care time: 20 minutes     Doran Stabler, DO Internal Medicine Residency My pager: [phone]

## 2020-08-09 DIAGNOSIS — J9601 Acute respiratory failure with hypoxia: Secondary | ICD-10-CM | POA: Diagnosis not present

## 2020-08-09 DIAGNOSIS — Z01818 Encounter for other preprocedural examination: Secondary | ICD-10-CM | POA: Diagnosis not present

## 2020-08-09 DIAGNOSIS — Z515 Encounter for palliative care: Secondary | ICD-10-CM

## 2020-08-09 LAB — GLUCOSE, CAPILLARY
Glucose-Capillary: 108 mg/dL — ABNORMAL HIGH (ref 70–99)
Glucose-Capillary: 167 mg/dL — ABNORMAL HIGH (ref 70–99)
Glucose-Capillary: 176 mg/dL — ABNORMAL HIGH (ref 70–99)
Glucose-Capillary: 148 mg/dL — ABNORMAL HIGH (ref 70–99)
Glucose-Capillary: 105 mg/dL — ABNORMAL HIGH (ref 70–99)

## 2020-08-09 LAB — CBC
HCT: 27.1 % — ABNORMAL LOW (ref 36.0–46.0)
Hemoglobin: 8.1 g/dL — ABNORMAL LOW (ref 12.0–15.0)
MCH: 20.9 pg — ABNORMAL LOW (ref 26.0–34.0)
MCHC: 29.9 g/dL — ABNORMAL LOW (ref 30.0–36.0)
MCV: 70 fL — ABNORMAL LOW (ref 80.0–100.0)
Platelets: 447 10*3/uL — ABNORMAL HIGH (ref 150–400)
RBC: 3.87 MIL/uL (ref 3.87–5.11)
RDW: 17 % — ABNORMAL HIGH (ref 11.5–15.5)
WBC: 20.2 10*3/uL — ABNORMAL HIGH (ref 4.0–10.5)
nRBC: 0.2 % (ref 0.0–0.2)

## 2020-08-09 LAB — BASIC METABOLIC PANEL
Anion gap: 11 (ref 5–15)
BUN: 30 mg/dL — ABNORMAL HIGH (ref 8–23)
CO2: 24 mmol/L (ref 22–32)
Calcium: 8.6 mg/dL — ABNORMAL LOW (ref 8.9–10.3)
Chloride: 98 mmol/L (ref 98–111)
Creatinine, Ser: 1.26 mg/dL — ABNORMAL HIGH (ref 0.44–1.00)
GFR calc Af Amer: 49 mL/min — ABNORMAL LOW (ref >60)
GFR calc non Af Amer: 43 mL/min — ABNORMAL LOW (ref >60)
Glucose, Bld: 101 mg/dL — ABNORMAL HIGH (ref 70–99)
Potassium: 4.7 mmol/L (ref 3.5–5.1)
Sodium: 133 mmol/L — ABNORMAL LOW (ref 135–145)

## 2020-08-09 NOTE — Progress Notes (Signed)
Mews score yellow due to HR, scheduled medication ordered and adminstered

## 2020-08-09 NOTE — Progress Notes (Signed)
Patient arrived to [facility] - 10

## 2020-08-09 NOTE — Progress Notes (Signed)
NAMECassadee Paul, MRN:  [id number], DOB:  1946/12/12, LOS: 34 ADMISSION DATE:  07/26/2020, CONSULTATION DATE:  07/26/2020 REFERRING MD:  Estanislado Pandy, CHIEF COMPLAINT:  L sided weakness   Brief History   Samantha Paul a 74 year old lady with past medical history of A. fib, hypertension, diabetes, who was admitted for acute onset of right gaze deviation, left side hemiplegia and expressive aphasia was found to have a right ACA/MCA occlusion status post IR revascularization. She was admitted to the ICU for mechanicalventilation. Hospital course was relatively uneventful and she was extubated on 8/14.  Past Medical History  Atrial fibrillation Hypertension Diabetes  Significant Hospital Events   8/1: Admit with right MCA/ACA occlusion status post revascularization 8/2: Hypotensive phenylephrine IV started  Consults:  PCCM Neurology  Procedures:  8/1 >>ETT  8/1 >>L Radial Aline  Significant Diagnostic Tests:  7/9 echocardiogram shows EF of 60 to 70%, narrow LVOT, mild LVH, normal right ventricle systolic function 8/1 CT head showed no acute finding 8/1 CTA showedEmbolic occlusion at the right carotid terminus. Minimal flow seen in the right MCA territory. Right ACA territory appears normally perfused, probably due to a patent anterior communicating artery. 8/2 brain MRI showed multifocal ischemic infarction within the right MCA territory.Additional cortical ischemia within the right ACA territory and right PCA territory. Large area of infarction in the right basal ganglia with petechial hemorrhage versus contrast staining. Punctate focus of acute ischemia in the left occipital lobe. 8/7 CT head showed evolving acute right MCA infarct. Low-density edema infarction is more apparent now in the insula and frontal lobe and basal ganglia. There is progressive low-density in the head of the caudate which did show infarct on prior diffusion imaging. Small amount of hemorrhage in the right basal  ganglia is unchanged from the prior CT. 8/13 venous duplex ultrasound of left upper extremity: Negative for DVT  Micro Data:  8/9 urine culture looks infected 8/10 sputum culture shows rare Klebsiella 8/7 blood culture no growth on day 3  Antimicrobials:  Zosyn 8/7 >> 8/9 Ceftriaxone 8/9 >>   Interim history/subjective:  No reported events from overnight  Objective   Blood pressure 111/81, pulse (!) 104, temperature 98.5 F (36.9 C), temperature source Axillary, resp. rate (!) 27, height 5' 2"  (1.575 m), weight 65.8 kg, SpO2 100 %.        Intake/Output Summary (Last 24 hours) at 08/09/2020 1438 Last data filed at 08/09/2020 1000 Gross per 24 hour  Intake 1402.41 ml  Output 1100 ml  Net 302.41 ml   Filed Weights   08/06/20 0500 08/08/20 0500 08/09/20 0500  Weight: 69.8 kg 67.4 kg 65.8 kg    Examination: General: WD/WN Asian F NARD HENT: [place]/AT PRRL Lungs: Clear bilaterally, anteriorly and laterally Cardiovascular: RRR, no m/r/g Abdomen: Soft, no guarding, +BS Extremities: No c/c/e Neuro: L hemiparesis  Assessment & Plan:  1) R MCA and ACA Acute CVA - Per Neurol recc, will hold apixaban -Cont statin and ASA 2) S/p Acute respiratory failure requiring mechanical ventilation  -Extubated yesterday; is stable for tx to floor 3)Atrial fibrillation  -Currently rate controlled  -Continue diltiazem 60 mg per tube every 6 hours -Continue metoprolol to 50 mg per tube twice daily.   -Telemetry monitoring 4) Leukocytosis -Afebrile for past 24 hrs and last CXR w/o active dz -Source of persistent inc. WBC unclear but patient does not appear to be clinically infected -today is last day of Rocephin -Would follow clinically 5) AKI -No change in creat from yesterday -  periodic BMP to trend 6)Microcytic anemia Likely secondary to iron deficiency; received Ferraheme yesterday -Follow 7) DM -Cont. Lantus and Novolog -SSI    Best practice:  Diet: TF Pain/Anxiety/Delirium  protocol (if indicated): prn VAP protocol (if indicated): N/A DVT prophylaxis: Heparin [place] GI prophylaxis: N/A Glucose control: Lantus, NovoLog and SSI Mobility: BR Code Status: Full Family Communication: Wth translator Disposition: Stable for tx out ICU  Labs   CBC: Recent Labs  Lab 08/05/20 0628 08/05/20 0628 08/05/20 1136 08/06/20 0929 08/07/20 0619 08/08/20 0555 08/09/20 0327  WBC 22.7*  --   --  20.6* 19.2* 18.9* 20.2*  HGB 8.2*   < > 10.9* 8.2* 8.8* 7.9* 8.1*  HCT 28.0*   < > 32.0* 26.9* 29.7* 26.8* 27.1*  MCV 68.8*  --   --  69.0* 69.7* 69.4* 70.0*  PLT 318  --   --  327 379 401* 447*   < > = values in this interval not displayed.    Basic Metabolic Panel: Recent Labs  Lab 08/05/20 0628 08/05/20 0628 08/05/20 1136 08/06/20 0929 08/07/20 0619 08/08/20 0555 08/09/20 0327  NA 131*   < > 134* 133* 133* 131* 133*  K 4.6   < > 4.5 4.5 4.6 4.8 4.7  CL 97*  --   --  100 100 97* 98  CO2 21*  --   --  21* 22 24 24   GLUCOSE 167*  --   --  121* 161* 140* 101*  BUN 30*  --   --  32* 31* 32* 30*  CREATININE 1.49*  --   --  1.40* 1.31* 1.25* 1.26*  CALCIUM 8.3*  --   --  8.1* 8.4* 8.2* 8.6*   < > = values in this interval not displayed.   GFR: Estimated Creatinine Clearance: 35.9 mL/min (A) (by C-G formula based on SCr of 1.26 mg/dL (H)). Recent Labs  Lab 08/06/20 0929 08/07/20 0619 08/08/20 0555 08/09/20 0327  WBC 20.6* 19.2* 18.9* 20.2*    Liver Function Tests: No results for input(s): AST, ALT, ALKPHOS, BILITOT, PROT, ALBUMIN in the last 168 hours. No results for input(s): LIPASE, AMYLASE in the last 168 hours. No results for input(s): AMMONIA in the last 168 hours.  ABG    Component Value Date/Time   PHART 7.423 08/05/2020 1136   PCO2ART 36.3 08/05/2020 1136   PO2ART 197 (H) 08/05/2020 1136   HCO3 23.5 08/05/2020 1136   TCO2 25 08/05/2020 1136   ACIDBASEDEF 6.6 (H) 07/26/2020 1720   O2SAT 100.0 08/05/2020 1136     Coagulation Profile: No  results for input(s): INR, PROTIME in the last 168 hours.  Cardiac Enzymes: No results for input(s): CKTOTAL, CKMB, CKMBINDEX, TROPONINI in the last 168 hours.  HbA1C: Hgb A1c MFr Bld  Date/Time Value Ref Range Status  07/27/2020 11:39 AM 8.7 (H) 4.8 - 5.6 % Final    Comment:    (NOTE) Pre diabetes:          5.7%-6.4%  Diabetes:              >6.4%  Glycemic control for   <7.0% adults with diabetes   06/29/2020 07:34 AM 12.7 (H) 4.8 - 5.6 % Final    Comment:    (NOTE) Pre diabetes:          5.7%-6.4%  Diabetes:              >6.4%  Glycemic control for   <7.0% adults with diabetes     CBG:  Recent Labs  Lab 08/08/20 1954 08/08/20 2323 08/09/20 0321 08/09/20 0740 08/09/20 1204  GLUCAP 144* 160* 108* 167* 176*    Past Medical History  She,  has a past medical history of Afib ([place]) (06/2020), Back pain, CVA (cerebral vascular accident) ([place]) (07/26/2020), DM II (diabetes mellitus, type II), controlled ([place]), HTN (hypertension), Knee pain, and Stroke ([place]) (?).   Surgical History    Past Surgical History:  Procedure Laterality Date  . CHOLECYSTECTOMY N/A 06/30/2020   Procedure: LAPAROSCOPIC CHOLECYSTECTOMY;  Surgeon: Georganna Skeans, MD;  Location: [facility];  Service: General;  Laterality: N/A;  . IR ANGIO VERTEBRAL SEL SUBCLAVIAN INNOMINATE UNI R MOD SED  07/29/2020  . IR PERCUTANEOUS ART THROMBECTOMY/INFUSION INTRACRANIAL INC DIAG ANGIO  07/26/2020  . RADIOLOGY WITH ANESTHESIA N/A 07/26/2020   Procedure: IR WITH ANESTHESIA;  Surgeon: Radiologist, Medication, MD;  Location: [facility];  Service: Radiology;  Laterality: N/A;     Social History   reports that she has never smoked. She has never used smokeless tobacco. She reports previous alcohol use. She reports previous drug use.   Family History   Her family history includes Arthritis in her brother.   Allergies Allergies  Allergen Reactions  . Metformin And Related Diarrhea    Abdominal pain and diarrhea     Home  Medications  Prior to Admission medications   Medication Sig Start Date End Date Taking? Authorizing Provider  acetaminophen (TYLENOL) 325 MG tablet Take 2-3 tablets (650-975 mg total) by mouth every 6 (six) hours as needed for mild pain. 07/03/20  Yes Simaan, Darci Current, PA-C  apixaban (ELIQUIS) 5 MG TABS tablet Take 1 tablet (5 mg total) by mouth 2 (two) times daily. 07/03/20  Yes Oswald Hillock, MD  blood glucose meter kit and supplies KIT Dispense based on patient and insurance preference. Use up to four times daily as directed. (FOR ICD-10--E11.9). 07/25/18  Yes Love, Ivan Anchors, PA-C  diltiazem (CARTIA XT) 240 MG 24 hr capsule Take 1 capsule (240 mg total) by mouth daily. 07/03/20 07/03/21 Yes Lama, Marge Duncans, MD  insulin glargine (LANTUS) 100 UNIT/ML Solostar Pen Inject 20 Units into the skin daily. 07/03/20  Yes Oswald Hillock, MD  Insulin Pen Needle (PEN NEEDLES) 32G X 4 MM MISC Use as directed with insulin pen 07/03/20  Yes Oswald Hillock, MD  metFORMIN (GLUCOPHAGE) 500 MG tablet Take 1 tablet (500 mg total) by mouth 2 (two) times daily with a meal. 07/03/20 07/03/21 Yes Darrick Meigs, Marge Duncans, MD  metoprolol tartrate (LOPRESSOR) 25 MG tablet Take 0.5 tablets (12.5 mg total) by mouth 2 (two) times daily. 07/03/20  Yes Oswald Hillock, MD  oxyCODONE (OXY IR/ROXICODONE) 5 MG immediate release tablet Take 1 tablet (5 mg total) by mouth every 6 (six) hours as needed for moderate pain or severe pain (pain not releived by tylenol or ibuprofen). 07/03/20  Yes Simaan, Darci Current, PA-C  rosuvastatin (CRESTOR) 20 MG tablet TAKE 1 TABLET(20 MG) BY MOUTH DAILY Patient taking differently: Take 20 mg by mouth daily.  12/20/18  Yes McCue, Janett Billow, NP  senna-docusate (SENOKOT-S) 8.6-50 MG tablet Take 2 tablets by mouth at bedtime. 07/25/18  Yes Love, Ivan Anchors, PA-C     Critical care time: 30 min

## 2020-08-09 NOTE — Progress Notes (Signed)
SLP Cancellation Note  Patient Details Name: Samantha Paul MRN: [id number] DOB: 1946/06/29   Cancelled treatment:       Reason Eval/Treat Not Completed: Medical issues which prohibited therapy; Other; Per discussion with RN, sounds like pt has been having some difficulty managing secretions. Orders for NTS, Cortrak already placed. She may not be ready for swallow evaluation. SLP reached out to schedule an interpreter. Will f/u for swallow evaluation pending pt's medical readiness and interpreter availability.    Mahala Menghini., M.A. CCC-SLP Acute Rehabilitation Services Pager [phone] Office [phone]  08/09/2020, 9:00 AM

## 2020-08-09 NOTE — Progress Notes (Signed)
STROKE TEAM PROGRESS NOTE   INTERVAL HISTORY Patient husband is at the bedside.  She was extubated yesterday and seems to be breathing well so far.  She remains neurologically unchanged with right gaze preference and left hemiplegia and some neglect.  She is following commands well in midline and on the right side.  Vital signs are stable.   Vitals:   08/08/20 2200 08/08/20 2300 08/09/20 0000 08/09/20 0500  BP: 124/68 118/80    Pulse: 88 83    Resp: 18 16    Temp:   97.9 F (36.6 C)   TempSrc:   Axillary   SpO2: 100% 100%    Weight:    65.8 kg  Height:       CBC:  Recent Labs  Lab 08/08/20 0555 08/09/20 0327  WBC 18.9* 20.2*  HGB 7.9* 8.1*  HCT 26.8* 27.1*  MCV 69.4* 70.0*  PLT 401* 447*   Basic Metabolic Panel:  Recent Labs  Lab 08/08/20 0555 08/09/20 0327  NA 131* 133*  K 4.8 4.7  CL 97* 98  CO2 24 24  GLUCOSE 140* 101*  BUN 32* 30*  CREATININE 1.25* 1.26*  CALCIUM 8.2* 8.6*   Lipid Panel:  No results for input(s): CHOL, TRIG, HDL, CHOLHDL, VLDL, LDLCALC in the last 168 hours. Lab Results  Component Value Date   HGBA1C 8.7 (H) 07/27/2020   Urine Drug Screen:  No results for input(s): LABOPIA, COCAINSCRNUR, LABBENZ, AMPHETMU, THCU, LABBARB in the last 168 hours.  Alcohol Level  No results for input(s): ETH in the last 168 hours.  IMAGING past 24 hours No results found.  PHYSICAL EXAM   Temp:  [97.9 F (36.6 C)-99.6 F (37.6 C)] 97.9 F (36.6 C) (08/15 0000) Pulse Rate:  [27-113] 83 (08/14 2300) Resp:  [16-27] 16 (08/14 2300) BP: (118-159)/(63-109) 118/80 (08/14 2300) SpO2:  [100 %] 100 % (08/14 2300) FiO2 (%):  [40 %] 40 % (08/14 0802) Weight:  [65.8 kg] 65.8 kg (08/15 0500)  General - Well nourished, well developed elderly Asian lady, not in distress  Ophthalmologic - fundi not visualized due to noncooperation.  Cardiovascular - irregularly irregular heart rate and rhythm.  Neuro -  , eyes are open with voice and maintained opening.  Able  to follow limited midline and peripheral commands on the right,  .  Eyes in right gaze position, barely cross midline. Not blinking to visual threat on the left but blinking on the right, doll's eyes present but not cross midline, PERRL. Corneal reflex present, gag and cough present. Breathing over the vent.  Facial symmetry not able to test due to ET tube.  Tongue protrusion not cooperative. On pain stimulation, active movement of RUE and RLE with RUE at least 4/5 and RLE at least 3-/5. LUE 0/5 and LLE 2-/5 withdraw on pain. DTR 1+ and bilateral positive babinski. Sensation not cooperative but brisk response to painful stimulation, coordination not cooperative and gait not tested.   ASSESSMENT/PLAN [name] is a 75 y.o. female with history of atrial fibrillation, HTN, DM and stroke presenting with right gaze deviation, left-sided hemiplegia and dense receptive and expressive aphasia. She received tPA 07/26/2020 at 1259. Taken to IR for R ICA T occlusion.  Stroke:   R MCA and ACA infarct due to R tICA occlusion s/p tPA and IR w/ TICI2c-3, embolic secondary to known AF but not on AC for at least a week  Code Stroke CT head No acute abnormality.     CTA  head & neck R tICA occlusion w/ min MCA flow. Some narrowing B VA and BA.   CT perfusion R frontal core 16 cc with penumbra 136 R MCA territory  Cerebral angio RT ICA T occlusion ->TICI 2C revascularization of RT MCA and a TICI 3 revascularization of RT ACA .  CT head post IR - new R frontal lobe hyper and hypointensity (infarct, edema, petechial hemorrhage)  MRI right MCA and ACA multifocal large infarcts with petechial hemo  CT repeat stable right MCA multifocal infarcts, minimal MLS.  2D Echo 07/03/2020 EF 65-70%. No source of embolus   LDL 90  HgbA1c 12.7  VTE prophylaxis - heparin subq   Eliquis (apixaban) daily prior to admission, now on aspirin 81 mg. Hold AC for now given petechial hemorrhage, large infarct and potential  procedure.  Therapy recommendations: Pending  Disposition:  Pending - full code for now and family meeting tomorrow morning  Acute Respiratory Failure  Secondary to stroke  Intubated for IR, left intubated post IR   off sedation   Still on vent, on weaning protocol  CCM on board  Atrial Fibrillation w/ RVR  Home anticoagulation:  Eliquis (apixaban) daily - not on for at least a week  On diltiazem 240, metoprolol 12.5 bid  PTA  Now on diltiazem 60 mg Q6 -> 90 Q6  metoprolol 50 mg Bid -> 100 bid -> 50 bid . No AC at this time given petechial hemorrhage, large infarct and potential procedure   Hypotension  Home meds:  None, no hx HTN . BP goal < 160 now . BP stable now . On metoprolol 100 bid -> 50 bid . Long-term BP goal normotensive  Hyperlipidemia  Home meds:  crestor 20, resumed in hospital  LDL 90, goal < 70  Continue statin at discharge  Diabetes type II Uncontrolled  Home meds:  lantus 20 daily, metformin 500 bid  HgbA1c 12.7, goal < 7.0  CBGs  SSI  Hypoglycemia much improved  DM coordinator assistance appreciated  Continue lantus 20->22, novolog 5U Q4h  Dysphagia . Secondary to stroke . NPO . Has cortrak . On TF @50  . Speech on board   Fever, leukocytosis  Tmax 102.3->101.2->100.6->101.6->101->afebrile  CXR unremarkable  UA WBC 21-50  U Cx insignificant growth  B Cx NGTD  Respiratory culture - klebsiella pneumo (pan sensitive)  WBC 14.7->20.6->26.7->22.8->25.3->21.8->22.7->19.2->20.2  Zosyn 8/7>>8/9  Rocephin 8/9>>  AKI  creatinine - 0.76->1.40->1.56->1.76->1.56->1.49-> 1.40->1.31->1.26  On TF @ 50 + Pro Source 45 ml daily  Off IVF  Continue BMP monitoring  CCM on board  Hyponatremia, improving  Na 134->133->126->128->133->134->131->133  Partially due to hyperglycemia  Close monitoring  Hx of stroke  06/2018 left pontine infarct likely secondary to small vessel disease source. CTA head and neck neg. EF  65-70%. LDL 140 and A1C 12.3. DAPT x 3 weeks and lipitor 40.  Other Stroke Risk Factors  Advanced age  Other Active Problems  Microcytic anemia 10.2->9.4->9.3->8.8->8.7->8.5->8.2->8.8->8.1  Code status - Full code   Palliative Care - family meeting  08/08/20 -> trial of extubation - if she fails they want reintubation trach and PEG.   Hospital day # 14 Patient seems to have tolerated extubation well so far.  Continue close monitoring of respiratory status and mobilize out of bed.  Therapy consults.  Speech therapy for swallow evaluation.  Transfer to neurology stepdown bed when available.  Long discussion with the patient's husband at the bedside and answered questions.  Discussed with Dr. 08/10/20 critical care medicine.  Greater than 50% time during this 35-minute visit was spent on counseling and coordination of care about her stroke and dysphagia and dysarthria and discussion with care team and answering questions   Delia Heady, MD      To contact Stroke Continuity provider, please refer to [email or web address]. After hours, contact General Neurology

## 2020-08-10 LAB — GLUCOSE, CAPILLARY
Glucose-Capillary: 120 mg/dL — ABNORMAL HIGH (ref 70–99)
Glucose-Capillary: 128 mg/dL — ABNORMAL HIGH (ref 70–99)
Glucose-Capillary: 144 mg/dL — ABNORMAL HIGH (ref 70–99)
Glucose-Capillary: 168 mg/dL — ABNORMAL HIGH (ref 70–99)
Glucose-Capillary: 170 mg/dL — ABNORMAL HIGH (ref 70–99)
Glucose-Capillary: 174 mg/dL — ABNORMAL HIGH (ref 70–99)
Glucose-Capillary: 197 mg/dL — ABNORMAL HIGH (ref 70–99)
Glucose-Capillary: 67 mg/dL — ABNORMAL LOW (ref 70–99)

## 2020-08-10 LAB — BASIC METABOLIC PANEL
Anion gap: 9 (ref 5–15)
BUN: 30 mg/dL — ABNORMAL HIGH (ref 8–23)
CO2: 24 mmol/L (ref 22–32)
Calcium: 8.2 mg/dL — ABNORMAL LOW (ref 8.9–10.3)
Chloride: 95 mmol/L — ABNORMAL LOW (ref 98–111)
Creatinine, Ser: 1.25 mg/dL — ABNORMAL HIGH (ref 0.44–1.00)
GFR calc Af Amer: 50 mL/min — ABNORMAL LOW (ref >60)
GFR calc non Af Amer: 43 mL/min — ABNORMAL LOW (ref >60)
Glucose, Bld: 199 mg/dL — ABNORMAL HIGH (ref 70–99)
Potassium: 4.9 mmol/L (ref 3.5–5.1)
Sodium: 128 mmol/L — ABNORMAL LOW (ref 135–145)

## 2020-08-10 LAB — CBC
HCT: 25.6 % — ABNORMAL LOW (ref 36.0–46.0)
Hemoglobin: 7.5 g/dL — ABNORMAL LOW (ref 12.0–15.0)
MCH: 20.5 pg — ABNORMAL LOW (ref 26.0–34.0)
MCHC: 29.3 g/dL — ABNORMAL LOW (ref 30.0–36.0)
MCV: 69.9 fL — ABNORMAL LOW (ref 80.0–100.0)
Platelets: 491 10*3/uL — ABNORMAL HIGH (ref 150–400)
RBC: 3.66 MIL/uL — ABNORMAL LOW (ref 3.87–5.11)
RDW: 17.3 % — ABNORMAL HIGH (ref 11.5–15.5)
WBC: 22.3 10*3/uL — ABNORMAL HIGH (ref 4.0–10.5)
nRBC: 0.2 % (ref 0.0–0.2)

## 2020-08-10 MED ORDER — DEXTROSE 50 % IV SOLN
INTRAVENOUS | Status: AC
  Administered 2020-08-10: 25 mL
  Filled 2020-08-10: qty 50

## 2020-08-10 MED ORDER — SODIUM CHLORIDE 0.9 % IV SOLN: INTRAVENOUS | Status: DC

## 2020-08-10 NOTE — Progress Notes (Signed)
STROKE TEAM PROGRESS NOTE   INTERVAL HISTORY Her husband and [place] language interpreter are at the bedside along with SLP.  Patient continues to speak very little and has dense left hemiplegia and some neglect.  She is still unable to swallow and needs to be n.p.o.  Vitals:   08/10/20 0445 08/10/20 0500 08/10/20 0645 08/10/20 0746  BP: 113/61  113/88 (!) 149/82  Pulse: 75  75 62  Resp: 20  20 20   Temp: 98.4 F (36.9 C)  98.9 F (37.2 C) 98.5 F (36.9 C)  TempSrc: Axillary  Axillary Axillary  SpO2: 100%  100% 100%  Weight:  68.1 kg    Height:       CBC:  Recent Labs  Lab 08/09/20 0327 08/10/20 0239  WBC 20.2* 22.3*  HGB 8.1* 7.5*  HCT 27.1* 25.6*  MCV 70.0* 69.9*  PLT 447* 491*   Basic Metabolic Panel:  Recent Labs  Lab 08/09/20 0327 08/10/20 0239  NA 133* 128*  K 4.7 4.9  CL 98 95*  CO2 24 24  GLUCOSE 101* 199*  BUN 30* 30*  CREATININE 1.26* 1.25*  CALCIUM 8.6* 8.2*   IMAGING past 24 hours No results found.  PHYSICAL EXAM  General - Well nourished, well developed elderly Asian lady, not in distress  Ophthalmologic - fundi not visualized due to noncooperation.  Cardiovascular - irregularly irregular heart rate and rhythm.  Neuro -  , eyes are open with voice and maintained opening.  Able to follow limited midline and peripheral commands on the right,  .  Eyes in right gaze position, barely cross midline. Not blinking to visual threat on the left but blinking on the right, doll's eyes present but not cross midline, PERRL. Corneal reflex present, gag and cough present. Breathing over the vent.  Left lower facial weakness.  Tongue protrusion not cooperative. On pain stimulation, active movement of RUE and RLE with RUE at least 4/5 and RLE at least 3-/5. LUE 0/5 and LLE 2-/5 withdraw on pain. DTR 1+ and bilateral positive babinski. Sensation not cooperative but brisk response to painful stimulation, coordination not cooperative and gait not  tested.   ASSESSMENT/PLAN [name] is a 74 y.o. female with history of atrial fibrillation, HTN, DM and stroke presenting with right gaze deviation, left-sided hemiplegia and dense receptive and expressive aphasia. She received tPA 07/26/2020 at 1259. Taken to IR for R ICA T occlusion.  Stroke:   R MCA and ACA infarct due to R tICA occlusion s/p tPA and IR w/ TICI2c-3, embolic secondary to known AF but not on AC for at least a week  Code Stroke CT head No acute abnormality.     CTA head & neck R tICA occlusion w/ min MCA flow. Some narrowing B VA and BA.   CT perfusion R frontal core 16 cc with penumbra 136 R MCA territory  Cerebral angio RT ICA T occlusion ->TICI 2C revascularization of RT MCA and a TICI 3 revascularization of RT ACA .  CT head post IR - new R frontal lobe hyper and hypointensity (infarct, edema, petechial hemorrhage)  MRI right MCA and ACA multifocal large infarcts with petechial hemo  CT repeat stable right MCA multifocal infarcts, minimal MLS.  2D Echo 07/03/2020 EF 65-70%. No source of embolus   LDL 90  HgbA1c 12.7  VTE prophylaxis - heparin subq   Eliquis (apixaban) daily prior to admission, now on aspirin 81 mg. Hold AC for now given petechial hemorrhage, large infarct and  potential procedure.  Therapy recommendations: SNF  Disposition:  Pending - full code    Acute Respiratory Failure, improved  Secondary to stroke  Intubated for IR, left intubated post IR   Extubated 8/14   Stable on the floor   Atrial Fibrillation w/ RVR  Home anticoagulation:  Eliquis (apixaban) daily - not on for at least a week  On diltiazem 240, metoprolol 12.5 bid  PTA  Now on diltiazem 60 mg Q6 -> 90 Q6  metoprolol 50 mg Bid -> 100 bid -> 50 bid No AC at this time given petechial hemorrhage, large infarct and inability to swallow.  Continue aspirin for now and start Eliquis after PEG tube or when able to swallow Hypotension  Home meds:  None, no hx HTN . BP  goal < 160 now . BP stable now . On metoprolol 100 bid -> 50 bid . Long-term BP goal normotensive  Hyperlipidemia  Home meds:  crestor 20, resumed in hospital  LDL 90, goal < 70  Continue statin at discharge  Diabetes type II Uncontrolled  Home meds:  lantus 20 daily, metformin 500 bid  HgbA1c 12.7, goal < 7.0  CBGs  SSI  Hypoglycemia much improved  DM coordinator assistance appreciated  Continue lantus 20->22, novolog 5U Q4h  Dysphagia . Secondary to stroke . NPO . Has cortrak . On TF @50  . Speech on board   Fever, leukocytosis  Tmax 102.3->101.2->100.6->101.6->101->afebrile  CXR unremarkable  UA WBC 21-50  U Cx insignificant growth  B Cx NGTD  Respiratory culture - klebsiella pneumo (pan sensitive)  WBC 14.7->20.6->26.7->22.8->25.3->21.8->22.7->19.2->20.2->22.3  Zosyn 8/7>>8/9  Rocephin 8/9>>8/15  Monitor labs  AKI  creatinine - 0.76->1.40->1.56->1.76->1.56->1.49-> 1.40->1.31->1.26->1.25  On TF @ 50 + Pro Source 45 ml daily  Off IVF  Continue BMP monitoring  Hyponatremia, improving  Na 134->133->126->128->133->134->131->133->128  Partially due to hyperglycemia  Close monitoring  Hx of stroke  06/2018 left pontine infarct likely secondary to small vessel disease source. CTA head and neck neg. EF 65-70%. LDL 140 and A1C 12.3. DAPT x 3 weeks and lipitor 40.  Other Stroke Risk Factors  Advanced age  Other Active Problems  Microcytic anemia 10.2->9.4->9.3->8.8->8.7->8.5->8.2->8.8->8.1->7.5 - monitor  Palliative Care - family meeting  08/08/20 ->   Rectal tube  Hospital day # 15 Continue ongoing therapies and pended tube feeding.  Family prefers inpatient rehab and will consult them.  May need to consider PEG tube if swallowing function does not improve soon.  Continue aspirin for now and switch to Eliquis when she is able to swallow or has a PEG tube.  Long discussion with patient and husband via interpreter and answered  questions.  Greater than 50% time during this 25-minute visit was spent in counseling and coordination of care about her stroke and dysphagia and answering questions 08/10/20, MD    To contact Stroke Continuity provider, please refer to Delia Heady. After hours, contact General Neurology

## 2020-08-10 NOTE — Evaluation (Signed)
Clinical/Bedside Swallow Evaluation Patient Details  Name: Inell Mimbs MRN: [id number] Date of Birth: [birth year]  Today's Date: 08/10/2020 Time: SLP Start Time (ACUTE ONLY): 1025 SLP Stop Time (ACUTE ONLY): 1055 SLP Time Calculation (min) (ACUTE ONLY): 30 min  Past Medical History:  Past Medical History:  Diagnosis Date  . Afib (HCC) 06/2020   Diagnosed 06/2020, on cardizem, metoprolol, eliquis  . Back pain   . CVA (cerebral vascular accident) (HCC) 07/26/2020   R MCA/ACA CVA  . DM II (diabetes mellitus, type II), controlled (HCC)   . HTN (hypertension)   . Knee pain   . Stroke [facility]) ?   residual mild right sided weakness    Past Surgical History:  Past Surgical History:  Procedure Laterality Date  . CHOLECYSTECTOMY N/A 06/30/2020   Procedure: LAPAROSCOPIC CHOLECYSTECTOMY;  Surgeon: [name], MD;  Location: [facility] OR;  Service: General;  Laterality: N/A;  . IR ANGIO VERTEBRAL SEL SUBCLAVIAN INNOMINATE UNI R MOD SED  07/29/2020  . IR PERCUTANEOUS ART THROMBECTOMY/INFUSION INTRACRANIAL INC DIAG ANGIO  07/26/2020  . RADIOLOGY WITH ANESTHESIA N/A 07/26/2020   Procedure: IR WITH ANESTHESIA;  Surgeon: Radiologist, Medication, MD;  Location: [facility] OR;  Service: Radiology;  Laterality: N/A;   HPI:  Patient is a 74 y.o. female with PMH: atrial fibrillation, HTN, DM, CVA in 2019, who presented to hospital with right gaze deviation, left sided hemiplegia and dense receptive and expressive aphasiaCTA head and neck showed embolic occulsion of right carotid terminus, minimal flow was seen in right MCA territory. MRI revealed multifocal ischemic infarction within the right MCA territory, greatest at the frontal operculum and insula; large area of infarction in right basal gangliaa with petechial hemorrhage versus contrast staining, punctate focus of acute ischemia in left occipital lobe.   Assessment / Plan / Recommendation Clinical Impression  Patient presents with a severe oropharyngeal dysphagia  secondary to CVA and current cognitive state. She was found to be holding secretions/saliva in mouth and SLP suctioned out. Patient able to hold and move toothette sponge to mouth with some tactile assistance and did open mouth slightly for oral care. No active lingual movement or attempts to manipulate small ice chip that SLP placed in anterior portion of mouth. One delayed cough response which likely was from poor secretion management. SLP Visit Diagnosis: Dysphagia, unspecified (R13.10)    Aspiration Risk  Severe aspiration risk;Risk for inadequate nutrition/hydration    Diet Recommendation NPO;Alternative means - temporary   Medication Administration: Via alternative means    Other  Recommendations Oral Care Recommendations: Oral care QID   Follow up Recommendations 24 hour supervision/assistance;Inpatient Rehab;Skilled Nursing facility      Frequency and Duration min 2x/week  2 weeks       Prognosis Prognosis for Safe Diet Advancement: Fair Barriers to Reach Goals: Cognitive deficits;Severity of deficits      Swallow Study   General Date of Onset: 07/26/20 HPI: Patient is a 74 y.o. female with PMH: atrial fibrillation, HTN, DM, CVA in 2019, who presented to hospital with right gaze deviation, left sided hemiplegia and dense receptive and expressive aphasiaCTA head and neck showed embolic occulsion of right carotid terminus, minimal flow was seen in right MCA territory. MRI revealed multifocal ischemic infarction within the right MCA territory, greatest at the frontal operculum and insula; large area of infarction in right basal gangliaa with petechial hemorrhage versus contrast staining, punctate focus of acute ischemia in left occipital lobe. Type of Study: Bedside Swallow Evaluation Previous Swallow Assessment: N/A Diet Prior  to this Study: NPO Temperature Spikes Noted: No Respiratory Status: Nasal cannula History of Recent Intubation: Yes Length of Intubations (days): 14  days Date extubated: 08/08/20 Behavior/Cognition: Lethargic/Drowsy;Requires cueing;Doesn't follow directions;Alert Oral Cavity Assessment: Other (comment) (patient unable to open mouth for full evaluation however appeared moist and free of dried secretions) Oral Care Completed by SLP: Yes Oral Cavity - Dentition: Adequate natural dentition Self-Feeding Abilities: Total assist Patient Positioning: Upright in bed Baseline Vocal Quality: Not observed Volitional Cough: Cognitively unable to elicit Volitional Swallow: Unable to elicit    Oral/Motor/Sensory Function Overall Oral Motor/Sensory Function: Moderate impairment (difficult to assess secondary to patient not able to adequately follow commands for oral-motor assessment.) Facial ROM: Reduced left Facial Strength: Reduced left Mandible: Impaired   Ice Chips Ice chips: Impaired Presentation: Spoon Oral Phase Impairments: Reduced labial seal;Poor awareness of bolus Other Comments: Patient unable to move small ice chip after placed in anterior portion of mouth   Thin Liquid Thin Liquid: Not tested    Nectar Thick     Honey Thick     Puree     Solid            Angela Nevin, MA, CCC-SLP Speech Therapy [facility] Acute Rehab Pager: [phone]

## 2020-08-10 NOTE — Progress Notes (Signed)
Nutrition Follow-up  DOCUMENTATION CODES:   Not applicable  INTERVENTION:  Continue Tube feeding via Cortrak tube: Jevity 1.2 at 50 ml/h (1200 ml per day) Prosource TF 45 ml daily  Provides 1480 kcal, 77 gm protein, 973 ml free water daily   NUTRITION DIAGNOSIS:   Inadequate oral intake related to inability to eat as evidenced by NPO status.  Ongoing  GOAL:   Patient will meet greater than or equal to 90% of their needs  Met with TF  MONITOR:   TF tolerance, Diet advancement, Labs, Weight trends, I & O's  REASON FOR ASSESSMENT:   Consult, Ventilator Enteral/tube feeding initiation and management  ASSESSMENT:   Pt with PMH of HTN, afib, DM admitted with R MCA stroke s/p tPA and thrombectomy.  8/1 pt intubated 8/4 Cortrak placed (gastric) 8/14 pt extubated  SLP evaluated pt today and is recommending pt remain NPO with nutrition and medication provided through alternative means.  Current TF: Jevity 1.2 at 50 ml/h, Prosource TF 45 ml daily Provides 1480 kcal, 77 gm protein, 973 ml free water daily  Labs: Na 128 (L), CBGs 197-170-174 Medications: Novolog, Lantus  Diet Order:   Diet Order            Diet NPO time specified  Diet effective now                 EDUCATION NEEDS:   No education needs have been identified at this time  Skin:  Skin Assessment: Reviewed RN Assessment  Last BM:  8/15  Height:   Ht Readings from Last 1 Encounters:  07/26/20 5' 2"  (1.575 m)    Weight:   Wt Readings from Last 1 Encounters:  08/10/20 68.1 kg    Ideal Body Weight:  50 kg  BMI:  Body mass index is 27.46 kg/m.  Estimated Nutritional Needs:   Kcal:  1500-1700  Protein:  75-90 grams  Fluid:  >1.5 L/day    Larkin Ina, MS, RD, LDN RD pager number and weekend/on-call pager number located in [place].

## 2020-08-11 DIAGNOSIS — I63411 Cerebral infarction due to embolism of right middle cerebral artery: Secondary | ICD-10-CM | POA: Diagnosis not present

## 2020-08-11 LAB — BASIC METABOLIC PANEL
Anion gap: 10 (ref 5–15)
BUN: 31 mg/dL — ABNORMAL HIGH (ref 8–23)
CO2: 25 mmol/L (ref 22–32)
Calcium: 8.4 mg/dL — ABNORMAL LOW (ref 8.9–10.3)
Chloride: 97 mmol/L — ABNORMAL LOW (ref 98–111)
Creatinine, Ser: 1.17 mg/dL — ABNORMAL HIGH (ref 0.44–1.00)
GFR calc Af Amer: 54 mL/min — ABNORMAL LOW (ref >60)
GFR calc non Af Amer: 47 mL/min — ABNORMAL LOW (ref >60)
Glucose, Bld: 139 mg/dL — ABNORMAL HIGH (ref 70–99)
Potassium: 5.3 mmol/L — ABNORMAL HIGH (ref 3.5–5.1)
Sodium: 132 mmol/L — ABNORMAL LOW (ref 135–145)

## 2020-08-11 LAB — CBC
HCT: 26.3 % — ABNORMAL LOW (ref 36.0–46.0)
Hemoglobin: 7.7 g/dL — ABNORMAL LOW (ref 12.0–15.0)
MCH: 21.1 pg — ABNORMAL LOW (ref 26.0–34.0)
MCHC: 29.3 g/dL — ABNORMAL LOW (ref 30.0–36.0)
MCV: 72.1 fL — ABNORMAL LOW (ref 80.0–100.0)
Platelets: 456 10*3/uL — ABNORMAL HIGH (ref 150–400)
RBC: 3.65 MIL/uL — ABNORMAL LOW (ref 3.87–5.11)
RDW: 18.2 % — ABNORMAL HIGH (ref 11.5–15.5)
WBC: 21.3 10*3/uL — ABNORMAL HIGH (ref 4.0–10.5)
nRBC: 0.1 % (ref 0.0–0.2)

## 2020-08-11 LAB — GLUCOSE, CAPILLARY
Glucose-Capillary: 108 mg/dL — ABNORMAL HIGH (ref 70–99)
Glucose-Capillary: 168 mg/dL — ABNORMAL HIGH (ref 70–99)
Glucose-Capillary: 155 mg/dL — ABNORMAL HIGH (ref 70–99)
Glucose-Capillary: 109 mg/dL — ABNORMAL HIGH (ref 70–99)
Glucose-Capillary: 116 mg/dL — ABNORMAL HIGH (ref 70–99)

## 2020-08-11 NOTE — Progress Notes (Signed)
°  Speech Language Pathology Treatment: Dysphagia  Patient Details Name: Samantha Paul MRN: [id number] DOB: 10-24-1946 Today's Date: 08/11/2020 Time: 1025-1050 SLP Time Calculation (min) (ACUTE ONLY): 25 min  Assessment / Plan / Recommendation Clinical Impression  Pt seen at bedside for follow up after BSE completed yesterday. Pt;s husband was present during this session. Pt was awake, but did not attempt to communicate or vocalize.  She was unable to follow her husbands commands despite visual, verbal, or tactile cues. Oral care was completed with suction. Slight blood tinge noted on oral swab - RN informed. Following oral care, pt accepted trials of ice chips. Minimal oral manipulation of ice chip x2. Audible swallow, but no overt s/s aspiration. Pt accepted straw sips of water x3. Oral holding was noted, but no overt s/s aspiration after the swallow.   Pt appears to be making small steps toward PO readiness, however, NPO status continues to be recommended. SLP will continue to follow to assess progress and readiness for po intake. Formal SLE deferred - pt not attempting to communicate or vocalize. Not following commands or answering questions with husband's assistance.   HPI HPI: Patient is a 74 y.o. female with PMH: atrial fibrillation, HTN, DM, CVA in 2019, who presented to hospital with right gaze deviation, left sided hemiplegia and dense receptive and expressive aphasiaCTA head and neck showed embolic occulsion of right carotid terminus, minimal flow was seen in right MCA territory. MRI revealed multifocal ischemic infarction within the right MCA territory, greatest at the frontal operculum and insula; large area of infarction in right basal gangliaa with petechial hemorrhage versus contrast staining, punctate focus of acute ischemia in left occipital lobe.      SLP Plan  Continue with current plan of care       Recommendations  Diet recommendations: NPO Medication Administration: Via  alternative means                Oral Care Recommendations: Oral care QID Follow up Recommendations: 24 hour supervision/assistance;Inpatient Rehab;Skilled Nursing facility SLP Visit Diagnosis: Dysphagia, unspecified (R13.10) Plan: Continue with current plan of care       GO              Vadis Slabach B. Murvin Natal, [facility], CCC-SLP Speech Language Pathologist Office: [phone]  Leigh Aurora 08/11/2020, 10:52 AM

## 2020-08-11 NOTE — Progress Notes (Signed)
STROKE TEAM PROGRESS NOTE   INTERVAL HISTORY Her husband is at the bedside    Patient continues to speak very little and has dense left hemiplegia and some neglect.  She is still unable to swallow and needs to be n.p.o. vital signs are stable.  Neuro exam is unchanged.  Vitals:   08/11/20 0500 08/11/20 0801 08/11/20 0805 08/11/20 1114  BP:  (!) 142/79 (!) 142/79 136/90  Pulse:  (!) 102 (!) 102 70  Resp:  18 18 (!) 21  Temp:  98.3 F (36.8 C) 98.3 F (36.8 C) 99.2 F (37.3 C)  TempSrc:  Oral Oral Axillary  SpO2:  99% 99% 100%  Weight: 67.2 kg     Height:       CBC:  Recent Labs  Lab 08/10/20 0239 08/11/20 0551  WBC 22.3* 21.3*  HGB 7.5* 7.7*  HCT 25.6* 26.3*  MCV 69.9* 72.1*  PLT 491* 456*   Basic Metabolic Panel:  Recent Labs  Lab 08/10/20 0239 08/11/20 0551  NA 128* 132*  K 4.9 5.3*  CL 95* 97*  CO2 24 25  GLUCOSE 199* 139*  BUN 30* 31*  CREATININE 1.25* 1.17*  CALCIUM 8.2* 8.4*   IMAGING past 24 hours No results found.  PHYSICAL EXAM  General - Well nourished, well developed elderly Asian lady, not in distress  Ophthalmologic - fundi not visualized due to noncooperation.  Cardiovascular - irregularly irregular heart rate and rhythm.  Neuro -  , eyes are open with voice and maintained opening.  Able to follow limited midline and peripheral commands on the right,  .  Eyes in right gaze position, barely cross midline. Not blinking to visual threat on the left but blinking on the right, doll's eyes present but not cross midline, PERRL. Corneal reflex present, gag and cough present. Breathing over the vent.  Left lower facial weakness.  Tongue protrusion not cooperative. On pain stimulation, active movement of RUE and RLE with RUE at least 4/5 and RLE at least 3-/5. LUE 0/5 and LLE 2-/5 withdraw on pain. DTR 1+ and bilateral positive babinski. Sensation not cooperative but brisk response to painful stimulation, coordination not cooperative and gait not  tested.   ASSESSMENT/PLAN Ms. Karalyn Burtt is a 74 y.o. female with history of atrial fibrillation, HTN, DM and stroke presenting with right gaze deviation, left-sided hemiplegia and dense receptive and expressive aphasia. She received tPA 07/26/2020 at 1259. Taken to IR for R ICA T occlusion.  Stroke:   R MCA and ACA infarct due to R tICA occlusion s/p tPA and IR w/ TICI2c-3, embolic secondary to known AF but not on AC for at least a week  Code Stroke CT head No acute abnormality.     CTA head & neck R tICA occlusion w/ min MCA flow. Some narrowing B VA and BA.   CT perfusion R frontal core 16 cc with penumbra 136 R MCA territory  Cerebral angio RT ICA T occlusion ->TICI 2C revascularization of RT MCA and a TICI 3 revascularization of RT ACA .  CT head post IR - new R frontal lobe hyper and hypointensity (infarct, edema, petechial hemorrhage)  MRI right MCA and ACA multifocal large infarcts with petechial hemo  CT repeat stable right MCA multifocal infarcts, minimal MLS.  2D Echo 07/03/2020 EF 65-70%. No source of embolus   LDL 90  HgbA1c 12.7  VTE prophylaxis - heparin subq   Eliquis (apixaban) daily prior to admission, now on aspirin 81 mg. Hold AC  for now given petechial hemorrhage, large infarct and potential procedure.  Therapy recommendations: SNF  Disposition:  Pending - full code    Acute Respiratory Failure, improved  Secondary to stroke  Intubated for IR, left intubated post IR   Extubated 8/14   Stable on the floor   Atrial Fibrillation w/ RVR  Home anticoagulation:  Eliquis (apixaban) daily - not on for at least a week  On diltiazem 240, metoprolol 12.5 bid  PTA  Now on diltiazem 60 mg Q6 -> 90 Q6  metoprolol 50 mg Bid -> 100 bid -> 50 bid No AC at this time given petechial hemorrhage, large infarct and inability to swallow.  Continue aspirin for now and start Eliquis after PEG tube or when able to swallow Hypotension  Home meds:  None, no hx HTN . BP  goal < 160 now . BP stable now . On metoprolol 100 bid -> 50 bid . Long-term BP goal normotensive  Hyperlipidemia  Home meds:  crestor 20, resumed in hospital  LDL 90, goal < 70  Continue statin at discharge  Diabetes type II Uncontrolled  Home meds:  lantus 20 daily, metformin 500 bid  HgbA1c 12.7, goal < 7.0  CBGs  SSI  Hypoglycemia much improved  DM coordinator assistance appreciated  Continue lantus 20->22, novolog 5U Q4h  Dysphagia . Secondary to stroke . NPO . Has cortrak . On TF @50  . Speech on board   Fever, leukocytosis  Tmax 102.3->101.2->100.6->101.6->101->afebrile  CXR unremarkable  UA WBC 21-50  U Cx insignificant growth  B Cx NGTD  Respiratory culture - klebsiella pneumo (pan sensitive)  WBC 14.7->20.6->26.7->22.8->25.3->21.8->22.7->19.2->20.2->22.3  Zosyn 8/7>>8/9  Rocephin 8/9>>8/15  Monitor labs  AKI  creatinine - 0.76->1.40->1.56->1.76->1.56->1.49-> 1.40->1.31->1.26->1.25  On TF @ 50 + Pro Source 45 ml daily  Off IVF  Continue BMP monitoring  Hyponatremia, improving  Na 134->133->126->128->133->134->131->133->128  Partially due to hyperglycemia  Close monitoring  Hx of stroke  06/2018 left pontine infarct likely secondary to small vessel disease source. CTA head and neck neg. EF 65-70%. LDL 140 and A1C 12.3. DAPT x 3 weeks and lipitor 40.  Other Stroke Risk Factors  Advanced age  Other Active Problems  Microcytic anemia 10.2->9.4->9.3->8.8->8.7->8.5->8.2->8.8->8.1->7.5 - monitor  Palliative Care - family meeting  08/08/20 ->   Rectal tube  Hospital day # 16 Continue ongoing therapies and Panda tube feeding.  Family prefers inpatient rehab and they have been consulted may need to consider PEG tube if swallowing function does not improve soon.  Continue aspirin for now and switch to Eliquis when she is able to swallow or has a PEG tube.  Long discussion with patient and husband  and answered questions. 08/10/20, MD    To contact Stroke Continuity provider, please refer to Delia Heady. After hours, contact General Neurology

## 2020-08-11 NOTE — Consult Note (Signed)
Physical Medicine and Rehabilitation Consult Reason for Consult: Right gaze deviation with left-sided weakness as well as aphasia Referring Physician: Dr. Leonie Man   HPI: Samantha Paul is a 74 y.o. right-handed female with history of atrial fibrillation maintained on Eliquis, CVA, hypertension, diabetes mellitus.  Per chart review patient lives with spouse and family.  1 level home 4 steps to entry.  Reportedly independent prior to admission.  Presented 07/26/2020 with left-sided weakness right gaze deviation as well as aphasia.  Admission chemistries BUN 22, creatinine 1.17, hemoglobin 10.7.  Cranial CT scan negative for acute changes.  Patient did receive TPA.  CT angiogram of head and neck embolic occlusion at the right carotid terminus.  Minimal flow seen in the right MCA territory.  Patient did undergo revascularization per interventional radiology.  MRI multifocal ischemic infarct right MCA territory greatest at the frontal operculum and insula.  Additional cortical ischemia within the right ACA territory right PCA territory.  Large area of infarction right basal ganglia with petechial hemorrhage versus contrast staining.  Echocardiogram with ejection fraction of 65 to 70% no embolus.  Patient did require intubation through 08/08/2020 as well as noted bouts of hypotension and monitored by critical care.  Patient currently is maintained on low-dose aspirin for CVA prophylaxis hold AC for now given petechial hemorrhage.  She is maintained on subcutaneous heparin for DVT prophylaxis.  Patient currently remains n.p.o. with alternative means of nutritional support.  Therapy evaluations completed with recommendations of physical medicine rehab consult.   Pt's husband is in the room- speaks some English, but asked for translator when explained pt is not appropriate for CIR.    Review of Systems  Unable to perform ROS: Acuity of condition   Past Medical History:  Diagnosis Date  . Afib ([place]) 06/2020    Diagnosed 06/2020, on cardizem, metoprolol, eliquis  . Back pain   . CVA (cerebral vascular accident) ([place]) 07/26/2020   R MCA/ACA CVA  . DM II (diabetes mellitus, type II), controlled ([place])   . HTN (hypertension)   . Knee pain   . Stroke [facility]) ?   residual mild right sided weakness    Past Surgical History:  Procedure Laterality Date  . CHOLECYSTECTOMY N/A 06/30/2020   Procedure: LAPAROSCOPIC CHOLECYSTECTOMY;  Surgeon: Georganna Skeans, MD;  Location: [place];  Service: General;  Laterality: N/A;  . IR ANGIO VERTEBRAL SEL SUBCLAVIAN INNOMINATE UNI R MOD SED  07/29/2020  . IR PERCUTANEOUS ART THROMBECTOMY/INFUSION INTRACRANIAL INC DIAG ANGIO  07/26/2020  . RADIOLOGY WITH ANESTHESIA N/A 07/26/2020   Procedure: IR WITH ANESTHESIA;  Surgeon: Radiologist, Medication, MD;  Location: [place];  Service: Radiology;  Laterality: N/A;   Family History  Problem Relation Age of Onset  . Arthritis Brother    Social History:  reports that she has never smoked. She has never used smokeless tobacco. She reports previous alcohol use. She reports previous drug use. Allergies:  Allergies  Allergen Reactions  . Metformin And Related Diarrhea    Abdominal pain and diarrhea   Medications Prior to Admission  Medication Sig Dispense Refill  . acetaminophen (TYLENOL) 325 MG tablet Take 2-3 tablets (650-975 mg total) by mouth every 6 (six) hours as needed for mild pain.    Marland Kitchen apixaban (ELIQUIS) 5 MG TABS tablet Take 1 tablet (5 mg total) by mouth 2 (two) times daily. 60 tablet 3  . blood glucose meter kit and supplies KIT Dispense based on patient and insurance preference. Use up to four  times daily as directed. (FOR ICD-10--E11.9). 1 each 0  . diltiazem (CARTIA XT) 240 MG 24 hr capsule Take 1 capsule (240 mg total) by mouth daily. 30 capsule 11  . insulin glargine (LANTUS) 100 UNIT/ML Solostar Pen Inject 20 Units into the skin daily. 15 mL 11  . Insulin Pen Needle (PEN NEEDLES) 32G X 4 MM MISC Use as directed with  insulin pen 100 each 11  . metFORMIN (GLUCOPHAGE) 500 MG tablet Take 1 tablet (500 mg total) by mouth 2 (two) times daily with a meal. 60 tablet 11  . metoprolol tartrate (LOPRESSOR) 25 MG tablet Take 0.5 tablets (12.5 mg total) by mouth 2 (two) times daily. 60 tablet 2  . oxyCODONE (OXY IR/ROXICODONE) 5 MG immediate release tablet Take 1 tablet (5 mg total) by mouth every 6 (six) hours as needed for moderate pain or severe pain (pain not releived by tylenol or ibuprofen). 10 tablet 0  . rosuvastatin (CRESTOR) 20 MG tablet TAKE 1 TABLET(20 MG) BY MOUTH DAILY (Patient taking differently: Take 20 mg by mouth daily. ) 30 tablet 0  . senna-docusate (SENOKOT-S) 8.6-50 MG tablet Take 2 tablets by mouth at bedtime. 60 tablet 0    Home: Home Living Family/patient expects to be discharged to:: Private residence Living Arrangements: Spouse/significant other, Children, Other relatives Available Help at Discharge: Family, Available 24 hours/day Type of Home: House Home Access: Stairs to enter [employer] of Steps: 4 Entrance Stairs-Rails: Can reach both Home Layout: One level Bathroom Shower/Tub: Multimedia programmer: Agar: Cane - single point, [employer] - quad, Wheelchair - manual, Bedside commode Additional Comments: Pt unable to provide info re: PLOF due to ETT.  info gleaned from chart.  In person interpreter utilized   Functional History: Prior Function Level of Independence: Independent Comments: Pt unable to provide info. Per July admission pt was independent Functional Status:  Mobility: Bed Mobility Overal bed mobility: Needs Assistance Bed Mobility: Supine to Sit, Sit to Supine Rolling: Total assist Supine to sit: Total assist Sit to supine: Total assist General bed mobility comments: pt doesn't follow commands or initiate transfers, or assist, dependent for all mobility Transfers General transfer comment: unable to safely attempt        ADL: ADL Overall ADL's : Needs assistance/impaired Eating/Feeding: NPO Grooming: Total assistance, Bed level Upper Body Bathing: Total assistance, Sitting Lower Body Bathing: Total assistance, Bed level Upper Body Dressing : Total assistance, Bed level, Sitting Lower Body Dressing: Total assistance, Bed level Toilet Transfer: Total assistance Toilet Transfer Details (indicate cue type and reason): unable to attempt  Toileting- Clothing Manipulation and Hygiene: Total assistance, Bed level Functional mobility during ADLs: Total assistance, +2 for safety/equipment, +2 for physical assistance  Cognition: Cognition Overall Cognitive Status: Impaired/Different from baseline Orientation Level: Other (comment) Cognition Arousal/Alertness: Lethargic Behavior During Therapy: Flat affect Overall Cognitive Status: Impaired/Different from baseline Area of Impairment: Attention, Following commands Current Attention Level: Focused Following Commands: Follows one step commands with increased time General Comments: Pt will follow one step simple motor commands on her Rt side with a slight delay.  She follows no commands on her Lt.   Blood pressure (!) 142/79, pulse (!) 102, temperature 98.3 F (36.8 C), temperature source Oral, resp. rate 18, height _0  (1.575 m), weight 67.2 kg, SpO2 99 %. Physical Exam Vitals and nursing note reviewed. Exam conducted with a chaperone present.  Constitutional:      Comments: Pt appears small, fragile, supine in bed- head turned to  R- even though husband on L in room from her, vacant stare, NAD  HENT:     Head:     Comments: Has Cortrak and O2 by [place] 3L in place Severe L facial droop- chewing on lip/scabs on lower lip esp; tongue to Left of midline- protruding most of time- cannot put in mouth on command- is automatic     Right Ear: External ear normal.     Left Ear: External ear normal.     Nose: Rhinorrhea present.     Mouth/Throat:     Comments:  Excessive secretions noted- husband constantly wiping lips Eyes:     Comments: Cannot look L of midline at ALL- even to cues- even difficult to look straight ahead- had head tilted to R slightly and looking Right.  A little nystagmus seen with automatic movements- didn't follow commands  Cardiovascular:     Comments: irregular rhythm- rate controlled Pulmonary:     Comments: Coarse breath sounds B/L- adequate air movement B/L Abdominal:     Comments: abd appeared distended- NT- hypoactive; rectal tube in place  Musculoskeletal:     Cervical back: Normal range of motion. No rigidity.     Comments: Was able to squeeze R hand automatically- but no other purposeful movement seen- at all- 0/5 on LUE and LLE- also no purposeful movement seen in RLE  Skin:    Comments: L hand and forearm significantly swollen- pitting edema es dorsum of L hand- not elevated  Neurological:     Comments: Patient is alert.  Noted right gaze deviation.  Aphasic and no verbal output during exam.  Aphasic- didn't respond to husband in their language- R gaze deviation- no responses of any kind- except nodded head could feel me touch L hand, but didn't recur when tried again.   Psychiatric:     Comments: Flat- aphasic     Results for orders placed or performed during the hospital encounter of 07/26/20 (from the past 24 hour(s))  Glucose, capillary     Status: Abnormal   Collection Time: 08/10/20 12:09 PM  Result Value Ref Range   Glucose-Capillary 174 (H) 70 - 99 mg/dL  Glucose, capillary     Status: Abnormal   Collection Time: 08/10/20  3:55 PM  Result Value Ref Range   Glucose-Capillary 128 (H) 70 - 99 mg/dL  Glucose, capillary     Status: Abnormal   Collection Time: 08/10/20  8:14 PM  Result Value Ref Range   Glucose-Capillary 67 (L) 70 - 99 mg/dL  Glucose, capillary     Status: Abnormal   Collection Time: 08/10/20  8:58 PM  Result Value Ref Range   Glucose-Capillary 168 (H) 70 - 99 mg/dL  Glucose,  capillary     Status: Abnormal   Collection Time: 08/10/20 11:57 PM  Result Value Ref Range   Glucose-Capillary 144 (H) 70 - 99 mg/dL  Glucose, capillary     Status: Abnormal   Collection Time: 08/11/20  3:56 AM  Result Value Ref Range   Glucose-Capillary 108 (H) 70 - 99 mg/dL  CBC     Status: Abnormal   Collection Time: 08/11/20  5:51 AM  Result Value Ref Range   WBC 21.3 (H) 4.0 - 10.5 K/uL   RBC 3.65 (L) 3.87 - 5.11 MIL/uL   Hemoglobin 7.7 (L) 12.0 - 15.0 g/dL   HCT 26.3 (L) 36 - 46 %   MCV 72.1 (L) 80.0 - 100.0 fL   MCH 21.1 (L) 26.0 - 34.0 pg  MCHC 29.3 (L) 30.0 - 36.0 g/dL   RDW 18.2 (H) 11.5 - 15.5 %   Platelets 456 (H) 150 - 400 K/uL   nRBC 0.1 0.0 - 0.2 %  Basic metabolic panel     Status: Abnormal   Collection Time: 08/11/20  5:51 AM  Result Value Ref Range   Sodium 132 (L) 135 - 145 mmol/L   Potassium 5.3 (H) 3.5 - 5.1 mmol/L   Chloride 97 (L) 98 - 111 mmol/L   CO2 25 22 - 32 mmol/L   Glucose, Bld 139 (H) 70 - 99 mg/dL   BUN 31 (H) 8 - 23 mg/dL   Creatinine, Ser 1.17 (H) 0.44 - 1.00 mg/dL   Calcium 8.4 (L) 8.9 - 10.3 mg/dL   GFR calc non Af Amer 47 (L) >60 mL/min   GFR calc Af Amer 54 (L) >60 mL/min   Anion gap 10 5 - 15  Glucose, capillary     Status: Abnormal   Collection Time: 08/11/20  8:01 AM  Result Value Ref Range   Glucose-Capillary 168 (H) 70 - 99 mg/dL   No results found.   Assessment/Plan: Diagnosis: L hemiplegia, severe aphasia, dysphagia, and L neglect with no significant purposeful movement seen to exam- due to  R MCA stroke and L occipital lobe CVA- in setting of Afib- off AC and DM with A1c of 12.7 1. Does the need for close, 24 hr/day medical supervision in concert with the patient's rehab needs make it unreasonable for this patient to be served in a less intensive setting? No 2. Co-Morbidities requiring supervision/potential complications: DM- D5H 29.9, aphasia, dysphagia- has cortrak, L neglect;, L hemiplegia 3. Due to bladder management,  bowel management, safety, skin/wound care, disease management, medication administration and patient education, does the patient require 24 hr/day rehab nursing? No 4. Does the patient require coordinated care of a physician, rehab nurse, therapy disciplines of needs PT, OT, and SLP in slowed setting to address physical and functional deficits in the context of the above medical diagnosis(es)? needs subacute therapies- cannot tolerate 3 hours/day Addressing deficits in the following areas: balance, endurance, strength, transferring, bowel/bladder control, bathing, dressing, feeding, grooming, toileting, cognition, speech, language, swallowing and psychosocial support 5. Can the patient actively participate in an intensive therapy program of at least 3 hrs of therapy per day at least 5 days per week? No 6. The potential for patient to make measurable gains while on inpatient rehab is poor 7. Anticipated functional outcomes upon discharge from inpatient rehab are max assist and total assist  with PT, max assist and total assist with OT, max assist and total assist with SLP. 8. Estimated rehab length of stay to reach the above functional goals is: n/a 9. Anticipated discharge destination: needs SNF 10. Overall Rehab/Functional Prognosis: poor  RECOMMENDATIONS: This patient's condition is appropriate for continued rehabilitative care in the following setting: SNF Patient has agreed to participate in recommended program. No Note that insurance prior authorization may be required for reimbursement for recommended care.  Comment:  1. Pt had no purposeful movement, even on R side, except AUTOMATIC squeezing of my hand and nodded head once- didn't recur- otherwise, didn't respond to exam at all- even to husband commands in their native language.  2. Could benefit from Amantadine for help with aphasia- 100 mg daily x 4 days then 200 mg daily- might also help with initiation.  3. L hand/LUE needs to be  elevated because edema- might need Dopplers.  4. Might benefit  from something to chew on- she's chewing her lip- something so doesn't hurt herself.  5. Pt not appropriate at this time for CIR_ explained to husband- he didn't like answer, but also spoke with Case mgr of floor- fyi.  6. Thank you for this consult- she will likely have spasticity and need Botox, ITB pump in future- 50% of stroke patients have spasticity- if so, our clinic number is [phone] for appointment.   I have personally performed a face to face diagnostic evaluation of this patient and formulated the key components of the plan.  Additionally, I have personally reviewed laboratory data, imaging studies, as well as relevant notes and concur with the physician assistant's documentation above.       Lavon Paganini Angiulli, PA-C 08/11/2020

## 2020-08-11 NOTE — Progress Notes (Signed)
Physical Therapy Treatment Patient Details Name: Samantha Paul MRN: [id number] DOB: [birth year] Today's Date: 08/11/2020    History of Present Illness 74 y.o. female with history of atrial fibrillation, HTN, DM and stroke, who presents with acute onset of right gaze deviation, left-sided hemiplegia and ?dense receptive and expressive aphasia. 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 Pt received tPA and underwent R ICA and MCA thrombectomy on 8/1. Follow-up MRI demonstrating moderate R MCA as well as R ACA and PCA infarcts with trace petechial hemorrhage.    PT Comments    Patient was able to complete simple commands with RUE and RLE with incr time and mixed cues (verbal, tactile, visual). Husband served as interpreter and did not understand providing a purely verbal command to assess her comprehension. While seated EOB for incr alertness, she assisted with holding her head upright. As she fatigued, her neck would flex and required assist to raise her head. Goals noted to be too high level and downgraded. Feel it would be beneficial to use an in-person interpreter (not husband) to better evaluate her ability to follow purely verbal commands.    Follow Up Recommendations  SNF     Equipment Recommendations  Wheelchair (measurements PT);Wheelchair cushion (measurements PT);Hospital bed    Recommendations for Other Services       Precautions / Restrictions Precautions Precautions: Fall Precaution Comments: dense Lt hemiplegic with Rt gaze preference Restrictions Weight Bearing Restrictions: No    Mobility  Bed Mobility Overal bed mobility: Needs Assistance Bed Mobility: Supine to Sit;Sit to Supine;Rolling Rolling: Max assist;+2 for physical assistance;+2 for safety/equipment   Supine to sit: Max assist;+2 for physical assistance;+2 for safety/equipment Sit to supine: Max assist;+2 for physical assistance;+2 for  safety/equipment   General bed mobility comments: Pt with increased movement on R, however due to inconsistent translation from husband it was hard to assess bed mobility capabilities, when adjusting pads at end of session, pt was noted to assist minimally to roll to L  Transfers                 General transfer comment: unable to safely attempt   Ambulation/Gait                 Stairs             Wheelchair Mobility    Modified Rankin (Stroke Patients Only) Modified Rankin (Stroke Patients Only) Pre-Morbid Rankin Score: No symptoms Modified Rankin: Severe disability     Balance Overall balance assessment: Needs assistance Sitting-balance support: Feet unsupported;Bilateral upper extremity supported Sitting balance-Leahy Scale: Poor Sitting balance - Comments: max-totalA for sitting balance, posterior lean; neck in flexed position Postural control: Posterior lean                                  Cognition Arousal/Alertness: Lethargic Behavior During Therapy: Flat affect Overall Cognitive Status: Difficult to assess Area of Impairment: Attention;Following commands                   Current Attention Level: Focused   Following Commands: Follows one step commands with increased time (with rt extremities)       General Comments: Pt will follow one step simple motor commands on her Rt side with less delay noted.  She follows no commands on her Lt and was noted to attempt to hold  her head up x3 with cues from husband      Exercises General Exercises - Lower Extremity Ankle Circles/Pumps: PROM;Both;5 reps Heel Slides: AAROM;Right;5 reps (assist with flexion, pt able to extend leg with minimal resi) Other Exercises Other Exercises: in sitting, cervical PROM (rotation, extension)    General Comments        Pertinent Vitals/Pain Pain Assessment: Faces Faces Pain Scale: Hurts little more Pain Location: Grimacing with ROM of  neck Pain Descriptors / Indicators: Grimacing Pain Intervention(s): Limited activity within patient's tolerance;Monitored during session;Repositioned    Home Living Family/patient expects to be discharged to:: Private residence Living Arrangements: Spouse/significant other;Children;Other relatives Available Help at Discharge: Family;Available 24 hours/day Type of Home: House Home Access: Stairs to enter Entrance Stairs-Rails: Can reach both Home Layout: One level Home Equipment: Cane - single point;Cane - quad;Wheelchair - manual;Bedside commode Additional Comments: Pt unable to provide info re: PLOF due to ETT.  info gleaned from chart.  In person interpreter utilized     Prior Function Level of Independence: Independent      Comments: Pt unable to provide info. Per July admission pt was independent   PT Goals (current goals can now be found in the care plan section) Acute Rehab PT Goals Patient Stated Goal: pt unable PT Goal Formulation: Patient unable to participate in goal setting Time For Goal Achievement: 08/25/20 Potential to Achieve Goals: Fair Progress towards PT goals: Not progressing toward goals - comment;Goals downgraded-see care plan (slower progress than anticipated)    Frequency    Min 2X/week      PT Plan Discharge plan needs to be updated    Co-evaluation PT/OT/SLP Co-Evaluation/Treatment: Yes Reason for Co-Treatment: Complexity of the patient's impairments (multi-system involvement);Other (comment) (Need for interpreter) PT goals addressed during session: Mobility/safety with mobility;Balance;Strengthening/ROM OT goals addressed during session: ADL's and self-care;Strengthening/ROM      AM-PAC PT "6 Clicks" Mobility   Outcome Measure  Help needed turning from your back to your side while in a flat bed without using bedrails?: Total Help needed moving from lying on your back to sitting on the side of a flat bed without using bedrails?: Total Help  needed moving to and from a bed to a chair (including a wheelchair)?: Total Help needed standing up from a chair using your arms (e.g., wheelchair or bedside chair)?: Total Help needed to walk in hospital room?: Total Help needed climbing 3-5 steps with a railing? : Total 6 Click Score: 6    End of Session Equipment Utilized During Treatment: Oxygen Activity Tolerance: Patient limited by fatigue Patient left: in bed;with call bell/phone within reach;with bed alarm set;with family/visitor present   PT Visit Diagnosis: Hemiplegia and hemiparesis;Difficulty in walking, not elsewhere classified (R26.2) Hemiplegia - Right/Left: Left Hemiplegia - dominant/non-dominant: Non-dominant Hemiplegia - caused by: Cerebral infarction     Time: 1438-1510 PT Time Calculation (min) (ACUTE ONLY): 32 min  Charges:  $Therapeutic Activity: 8-22 mins                      Jerolyn Center, PT Pager [phone]    Zena Amos 08/11/2020, 5:53 PM

## 2020-08-11 NOTE — TOC Initial Note (Signed)
Transition of Care [facility]) - Initial/Assessment Note    Patient Details  Name: Samantha Paul MRN: [id number] Date of Birth: March 16, 1946  Transition [facility] CM/SW Contact:    Pollie Friar, RN Phone Number: 08/11/2020, 3:21 PM  Clinical Narrative:                 CIR does not feel pt is appropriate for inpatient rehab. CM has met with the patient and her spouse. Spouse made aware that CIR is a no and he stated he needs to think about SNF rehab. Spouse also asked to meet with MD and interpreter. CM has left voicemail with interpreter services for tomorrow at 10:30 am per MD request.  TOC following.  Expected Discharge Plan: Skilled Nursing Facility Barriers to Discharge: Continued Medical Work up   Patient Goals and CMS Choice     Choice offered to / list presented to : Spouse  Expected Discharge Plan and Services Expected Discharge Plan: [place] In-house Referral: Clinical Social Work Discharge Planning Services: CM Consult Post Acute Care Choice: [place] Living arrangements for the past 2 months: [place]                                      Prior Living Arrangements/Services Living arrangements for the past 2 months: Single Family Home Lives with:: Spouse Patient language and need for interpreter reviewed:: Yes ([place])        Need for Family Participation in Patient Care: Yes (Comment)     Criminal Activity/Legal Involvement Pertinent to Current Situation/Hospitalization: No - Comment as needed  Activities of Daily Living   ADL Screening (condition at time of admission) Is the patient deaf or have difficulty hearing?: No Does the patient have difficulty seeing, even when wearing glasses/contacts?: No Does the patient have difficulty concentrating, remembering, or making decisions?: Yes Does the patient have difficulty dressing or bathing?: Yes Does the patient have difficulty walking or climbing stairs?:  Yes  Permission Sought/Granted                  Emotional Assessment Appearance:: Appears stated age         Psych Involvement: No (comment)  Admission diagnosis:  Stroke ([place]) [I63.9] Encounter for orogastric (OG) tube placement [Z46.59] Encounter for intubation [Z01.818] Acute respiratory failure with hypoxia ([place]) [J96.01] Cerebrovascular accident (CVA) due to thrombosis of precerebral artery ([place]) [I63.00] Stroke due to embolism ([place]) [I63.9] Middle cerebral artery embolism, right [I66.01] Patient Active Problem List   Diagnosis Date Noted  . Palliative care by specialist   . DNR (do not resuscitate) discussion   . Acute respiratory failure with hypoxia ([place])   . Stroke ([place]) 07/26/2020  . Stroke due to embolism ([place]) 07/26/2020  . Middle cerebral artery embolism, right 07/26/2020  . Sepsis ([place]) 06/29/2020  . DKA (diabetic ketoacidoses) ([place]) 06/29/2020  . ARF (acute renal failure) ([place]) 06/29/2020  . Hemiparesis affecting right side as late effect of stroke ([place])   . Acute blood loss anemia   . Left pontine stroke ([place]) 07/13/2018  . Essential hypertension   . Diabetes mellitus type 2 in nonobese (HCC)   . CVA (cerebral vascular accident) ([place]) 07/10/2018  . Hypertensive urgency 07/10/2018  . Hyperglycemia 07/10/2018  . Right sided weakness 07/10/2018  . Hyperlipidemia    PCP:  Elwyn Reach, MD Pharmacy:   [facility] [phone] -  [place], [address] [place] [address] Phone: [phone] Fax: [phone]  [facility] [place], [place] - [address] [place] [address] Phone: [phone] Fax: [phone]     Social Determinants of Health (SDOH) Interventions    Readmission Risk Interventions Readmission Risk Prevention Plan 07/03/2020 07/03/2020  Post Dischage Appt Complete -  Medication Screening  Complete Complete  Transportation Screening Complete Complete

## 2020-08-11 NOTE — Progress Notes (Signed)
Occupational Therapy Treatment Patient Details Name: Samantha Paul MRN: [id number] DOB: 04-02-1946 Today's Date: 08/11/2020    History of present illness 74 y.o. female with history of atrial fibrillation, HTN, DM and stroke, who presents with acute onset of right gaze deviation, left-sided hemiplegia and dense receptive and expressive aphasia. 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 Pt received tPA and underwent R ICA and MCA thrombectomy on 8/1. Follow-up MRI demonstrating moderate R MCA as well as R ACA and PCA infarcts with trace petechial hemorrhage.   OT comments  Patient continues to make progress towards goals in skilled OT session. Patient's session encompassed co-treat with PT in order to assess functional capabilities. Pt continues to demonstrate dense hemiplegia, however able to follow more one step commands with less of a delay with husband as interpreter. (Of note, husband demonstrated difficulty interpreting but pt is showing increased responsiveness so an actual interpreter would be efficacious for future treatment sessions). Completed edema massage to LUE due to significant swelling in hand and digits and positioned to aid in draining at end of session. Discharge remains appropriate due to level of care needed; will continue to follow acutely.    Follow Up Recommendations  SNF;LTACH    Equipment Recommendations  None recommended by OT    Recommendations for Other Services      Precautions / Restrictions Precautions Precautions: Fall Precaution Comments: dense Lt hemiplegic with Rt gaze preference Restrictions Weight Bearing Restrictions: No       Mobility Bed Mobility Overal bed mobility: Needs Assistance Bed Mobility: Supine to Sit;Sit to Supine;Rolling Rolling: Max assist;+2 for physical assistance;+2 for safety/equipment   Supine to sit: Max assist;+2 for physical assistance;+2 for  safety/equipment Sit to supine: Max assist;+2 for physical assistance;+2 for safety/equipment   General bed mobility comments: Pt with increased movement on R, however due to inconsistent translation from husband it was hard to assess bed mobility capabilities, when adjusting pads at end of session, pt was noted to assist minimally to roll to L  Transfers                 General transfer comment: unable to safely attempt     Balance Overall balance assessment: Needs assistance Sitting-balance support: Feet unsupported;Bilateral upper extremity supported Sitting balance-Leahy Scale: Poor Sitting balance - Comments: max-totalA for sitting balance, posterior lean                                   ADL either performed or assessed with clinical judgement   ADL Overall ADL's : Needs assistance/impaired     Grooming: Maximal assistance;Bed level Grooming Details (indicate cue type and reason): increased ability to follow commands with R                             Functional mobility during ADLs: Maximal assistance;+2 for safety/equipment;+2 for physical assistance General ADL Comments: pt making progress, however remains a dense hemi     Vision       Perception     Praxis      Cognition Arousal/Alertness: Lethargic Behavior During Therapy: Flat affect Overall Cognitive Status: Impaired/Different from baseline Area of Impairment: Attention;Following commands                   Current Attention Level: Focused  Following Commands: Follows one step commands with increased time       General Comments: Pt will follow one step simple motor commands on her Rt side with less delay noted.  She follows no commands on her Lt and was noted to attempt to hold her head up x3 with cues from husband        Exercises     Shoulder Instructions       General Comments      Pertinent Vitals/ Pain       Pain Assessment: Faces Faces Pain  Scale: Hurts little more Pain Location: Grimacing with ROM of neck Pain Descriptors / Indicators: Grimacing Pain Intervention(s): Monitored during session;Repositioned;Limited activity within patient's tolerance  Home Living                                          Prior Functioning/Environment              Frequency  Min 2X/week        Progress Toward Goals  OT Goals(current goals can now be found in the care plan section)  Progress towards OT goals: Progressing toward goals  Acute Rehab OT Goals Patient Stated Goal: To improve mobility quality and reduce caregiver burden. OT Goal Formulation: With family Time For Goal Achievement: 08/12/20 Potential to Achieve Goals: Good  Plan Discharge plan remains appropriate    Co-evaluation        PT goals addressed during session: Mobility/safety with mobility;Balance;Strengthening/ROM OT goals addressed during session: ADL's and self-care;Strengthening/ROM      AM-PAC OT "6 Clicks" Daily Activity     Outcome Measure   Help from another person eating meals?: A Lot Help from another person taking care of personal grooming?: A Lot Help from another person toileting, which includes using toliet, bedpan, or urinal?: A Lot Help from another person bathing (including washing, rinsing, drying)?: A Lot Help from another person to put on and taking off regular upper body clothing?: A Lot Help from another person to put on and taking off regular lower body clothing?: A Lot 6 Click Score: 12    End of Session    OT Visit Diagnosis: Unsteadiness on feet (R26.81);Cognitive communication deficit (R41.841);Hemiplegia and hemiparesis Symptoms and signs involving cognitive functions: Cerebral infarction Hemiplegia - Right/Left: Left Hemiplegia - dominant/non-dominant: Non-Dominant Hemiplegia - caused by: Cerebral infarction   Activity Tolerance Patient limited by fatigue;Patient tolerated treatment well    Patient Left in bed;with call bell/phone within reach;with family/visitor present;with bed alarm set   Nurse Communication Mobility status        Time: 1438-1510 OT Time Calculation (min): 32 min  Charges: OT Treatments $Self Care/Home Management : 8-22 mins  Pollyann Glen E. Ella Guillotte, COTA/L Acute Rehabilitation Services [phone]   Cherlyn Cushing 08/11/2020, 4:24 PM

## 2020-08-11 NOTE — Progress Notes (Signed)
Inpatient Rehabilitation Admissions Coordinator  Patient not a candidate for CIR admit per Dr. Berline Chough. We will sign off at this time.  Ottie Glazier, RN, MSN Rehab Admissions Coordinator [phone] 08/11/2020 4:22 PM

## 2020-08-12 LAB — BASIC METABOLIC PANEL
Anion gap: 8 (ref 5–15)
BUN: 30 mg/dL — ABNORMAL HIGH (ref 8–23)
CO2: 25 mmol/L (ref 22–32)
Calcium: 8.3 mg/dL — ABNORMAL LOW (ref 8.9–10.3)
Chloride: 98 mmol/L (ref 98–111)
Creatinine, Ser: 1.17 mg/dL — ABNORMAL HIGH (ref 0.44–1.00)
GFR calc Af Amer: 54 mL/min — ABNORMAL LOW (ref >60)
GFR calc non Af Amer: 47 mL/min — ABNORMAL LOW (ref >60)
Glucose, Bld: 161 mg/dL — ABNORMAL HIGH (ref 70–99)
Potassium: 4.9 mmol/L (ref 3.5–5.1)
Sodium: 131 mmol/L — ABNORMAL LOW (ref 135–145)

## 2020-08-12 LAB — CBC WITH DIFFERENTIAL/PLATELET
Abs Immature Granulocytes: 0.57 10*3/uL — ABNORMAL HIGH (ref 0.00–0.07)
Basophils Absolute: 0.1 10*3/uL (ref 0.0–0.1)
Basophils Relative: 0 %
Eosinophils Absolute: 0.1 10*3/uL (ref 0.0–0.5)
Eosinophils Relative: 1 %
HCT: 26.3 % — ABNORMAL LOW (ref 36.0–46.0)
Hemoglobin: 7.7 g/dL — ABNORMAL LOW (ref 12.0–15.0)
Immature Granulocytes: 3 %
Lymphocytes Relative: 8 %
Lymphs Abs: 1.7 10*3/uL (ref 0.7–4.0)
MCH: 21.3 pg — ABNORMAL LOW (ref 26.0–34.0)
MCHC: 29.3 g/dL — ABNORMAL LOW (ref 30.0–36.0)
MCV: 72.9 fL — ABNORMAL LOW (ref 80.0–100.0)
Monocytes Absolute: 1.7 10*3/uL — ABNORMAL HIGH (ref 0.1–1.0)
Monocytes Relative: 8 %
Neutro Abs: 16 10*3/uL — ABNORMAL HIGH (ref 1.7–7.7)
Neutrophils Relative %: 80 %
Platelets: 471 10*3/uL — ABNORMAL HIGH (ref 150–400)
RBC: 3.61 MIL/uL — ABNORMAL LOW (ref 3.87–5.11)
RDW: 18.8 % — ABNORMAL HIGH (ref 11.5–15.5)
WBC: 20.1 10*3/uL — ABNORMAL HIGH (ref 4.0–10.5)
nRBC: 0.1 % (ref 0.0–0.2)

## 2020-08-12 LAB — GLUCOSE, CAPILLARY
Glucose-Capillary: 109 mg/dL — ABNORMAL HIGH (ref 70–99)
Glucose-Capillary: 113 mg/dL — ABNORMAL HIGH (ref 70–99)
Glucose-Capillary: 116 mg/dL — ABNORMAL HIGH (ref 70–99)
Glucose-Capillary: 147 mg/dL — ABNORMAL HIGH (ref 70–99)
Glucose-Capillary: 152 mg/dL — ABNORMAL HIGH (ref 70–99)
Glucose-Capillary: 153 mg/dL — ABNORMAL HIGH (ref 70–99)
Glucose-Capillary: 159 mg/dL — ABNORMAL HIGH (ref 70–99)

## 2020-08-12 NOTE — Progress Notes (Signed)
STROKE TEAM PROGRESS NOTE   INTERVAL HISTORY Her husband and [place] language interpreter are at the bedside    Patient continues to speak very little and has dense left hemiplegia and some neglect.  She is still unable to swallow and needs to be n.p.o. vital signs are stable.  Neuro exam is unchanged.  Patient has been turned down by inpatient rehab and will likely need to go to skilled nursing facility.  Vital signs are stable.  Neuro exam is unchanged  Vitals:   08/12/20 0346 08/12/20 0500 08/12/20 1209 08/12/20 1629  BP: (!) 139/95  123/70 (!) 152/97  Pulse: 96  75 (!) 109  Resp: 20  18 20   Temp: 97.6 F (36.4 C)  98.3 F (36.8 C) 100 F (37.8 C)  TempSrc: Oral  Oral Oral  SpO2: 100%  100% 100%  Weight:  68.2 kg    Height:       CBC:  Recent Labs  Lab 08/11/20 0551 08/12/20 0151  WBC 21.3* 20.1*  NEUTROABS  --  16.0*  HGB 7.7* 7.7*  HCT 26.3* 26.3*  MCV 72.1* 72.9*  PLT 456* 471*   Basic Metabolic Panel:  Recent Labs  Lab 08/11/20 0551 08/12/20 0151  NA 132* 131*  K 5.3* 4.9  CL 97* 98  CO2 25 25  GLUCOSE 139* 161*  BUN 31* 30*  CREATININE 1.17* 1.17*  CALCIUM 8.4* 8.3*   IMAGING past 24 hours No results found.  PHYSICAL EXAM  General - Well nourished, well developed elderly Asian lady, not in distress  Ophthalmologic - fundi not visualized due to noncooperation.  Cardiovascular - irregularly irregular heart rate and rhythm.  Neuro -  , eyes are open with voice and maintained opening.  Able to follow limited midline and peripheral commands on the right,  .  Eyes in right gaze position, barely cross midline. Not blinking to visual threat on the left but blinking on the right, doll's eyes present but not cross midline, PERRL. Corneal reflex present, gag and cough present. Breathing over the vent.  Left lower facial weakness.  Tongue protrusion not cooperative. On pain stimulation, active movement of RUE and RLE with RUE at least 4/5 and RLE at least 3-/5.  LUE 0/5 and LLE 2-/5 withdraw on pain. DTR 1+ and bilateral positive babinski. Sensation not cooperative but brisk response to painful stimulation, coordination not cooperative and gait not tested.   ASSESSMENT/PLAN Ms. Kynzlie Bieser is a 74 y.o. female with history of atrial fibrillation, HTN, DM and stroke presenting with right gaze deviation, left-sided hemiplegia and dense receptive and expressive aphasia. She received tPA 07/26/2020 at 1259. Taken to IR for R ICA T occlusion.  Stroke:   R MCA and ACA infarct due to R tICA occlusion s/p tPA and IR w/ TICI2c-3, embolic secondary to known AF but not on AC for at least a week  Code Stroke CT head No acute abnormality.     CTA head & neck R tICA occlusion w/ min MCA flow. Some narrowing B VA and BA.   CT perfusion R frontal core 16 cc with penumbra 136 R MCA territory  Cerebral angio RT ICA T occlusion ->TICI 2C revascularization of RT MCA and a TICI 3 revascularization of RT ACA .  CT head post IR - new R frontal lobe hyper and hypointensity (infarct, edema, petechial hemorrhage)  MRI right MCA and ACA multifocal large infarcts with petechial hemo  CT repeat stable right MCA multifocal infarcts, minimal MLS.  2D Echo 07/03/2020 EF 65-70%. No source of embolus   LDL 90  HgbA1c 12.7  VTE prophylaxis - heparin subq   Eliquis (apixaban) daily prior to admission, now on aspirin 81 mg. Hold AC for now given petechial hemorrhage, large infarct and potential procedure.  Therapy recommendations: SNF  Disposition:  Pending - full code    Acute Respiratory Failure, improved  Secondary to stroke  Intubated for IR, left intubated post IR   Extubated 8/14   Stable on the floor   Atrial Fibrillation w/ RVR  Home anticoagulation:  Eliquis (apixaban) daily - not on for at least a week  On diltiazem 240, metoprolol 12.5 bid  PTA  Now on diltiazem 60 mg Q6 -> 90 Q6  metoprolol 50 mg Bid -> 100 bid -> 50 bid No AC at this time given  petechial hemorrhage, large infarct and inability to swallow.  Continue aspirin for now and start Eliquis after PEG tube or when able to swallow Hypotension  Home meds:  None, no hx HTN . BP goal < 160 now . BP stable now . On metoprolol 100 bid -> 50 bid . Long-term BP goal normotensive  Hyperlipidemia  Home meds:  crestor 20, resumed in hospital  LDL 90, goal < 70  Continue statin at discharge  Diabetes type II Uncontrolled  Home meds:  lantus 20 daily, metformin 500 bid  HgbA1c 12.7, goal < 7.0  CBGs  SSI  Hypoglycemia much improved  DM coordinator assistance appreciated  Continue lantus 20->22, novolog 5U Q4h  Dysphagia . Secondary to stroke . NPO . Has cortrak . On TF @50  . Speech on board   Fever, leukocytosis  Tmax 102.3->101.2->100.6->101.6->101->afebrile  CXR unremarkable  UA WBC 21-50  U Cx insignificant growth  B Cx NGTD  Respiratory culture - klebsiella pneumo (pan sensitive)  WBC 14.7->20.6->26.7->22.8->25.3->21.8->22.7->19.2->20.2->22.3  Zosyn 8/7>>8/9  Rocephin 8/9>>8/15  Monitor labs  AKI  creatinine - 0.76->1.40->1.56->1.76->1.56->1.49-> 1.40->1.31->1.26->1.25  On TF @ 50 + Pro Source 45 ml daily  Off IVF  Continue BMP monitoring  Hyponatremia, improving  Na 134->133->126->128->133->134->131->133->128  Partially due to hyperglycemia  Close monitoring  Hx of stroke  06/2018 left pontine infarct likely secondary to small vessel disease source. CTA head and neck neg. EF 65-70%. LDL 140 and A1C 12.3. DAPT x 3 weeks and lipitor 40.  Other Stroke Risk Factors  Advanced age  Other Active Problems  Microcytic anemia 10.2->9.4->9.3->8.8->8.7->8.5->8.2->8.8->8.1->7.5 - monitor  Palliative Care - family meeting  08/08/20 ->   Rectal tube  Hospital day # 17 Continue ongoing therapies and Panda tube feeding.  I had a long discussion with the patient's husband using 08/10/20 language interpreter and discussed the  prognosis and plan of care.  She will likely need to go to skilled nursing facility for rehab and will need a PEG tube placement as her swallow function is unlikely to improve soon.  Patient husband is willing and will call trauma team to place PEG in the next couple of days.  Hopefully transfer to skilled nursing facility after PEG early next week.  Switch to Eliquis after she has a PEG tube.  Greater than 50% time during this 25-minute visit was spent on counseling and coordination of care and discussion with husband, [place] and answering questions.  Child psychotherapist, MD    To contact Stroke Continuity provider, please refer to Delia Heady. After hours, contact General Neurology

## 2020-08-12 NOTE — Consult Note (Signed)
[facility] Surgery Consult Note  Samantha Paul 14-Nov-1946  [id number].    Requesting MD: Leonie Man, MD Chief Complaint/Reason for Consult: dysphagia, PEG tube  HPI:  Samantha Paul is a 74 y/o F with a PMH a.fib, HTN, CVA, and DM who presented to ED 8/1 with a cc acute onset right gaze deviation, left hemiplegia, and aphasia. CT angio of the head revealed embolic occlusion of the right carotid terminus with minimal flow seen in the right MCA and a cor infarct right frontal operculum. The patient received tPA and underwent interventional revascularization prior to being admitted to the ICU for further management. The patient was extubated 8/14. She is tolerated tube feeds via cortrak but continues to have dysphagia. Trauma surgery has been consulted for PEG placement.  ROS: Review of Systems  Unable to perform ROS: Mental acuity    Family History  Problem Relation Age of Onset  . Arthritis Brother     Past Medical History:  Diagnosis Date  . Afib ([place]) 06/2020   Diagnosed 06/2020, on cardizem, metoprolol, eliquis  . Back pain   . CVA (cerebral vascular accident) ([place]) 07/26/2020   R MCA/ACA CVA  . DM II (diabetes mellitus, type II), controlled ([place])   . HTN (hypertension)   . Knee pain   . Stroke [facility]) ?   residual mild right sided weakness     Past Surgical History:  Procedure Laterality Date  . CHOLECYSTECTOMY N/A 06/30/2020   Procedure: LAPAROSCOPIC CHOLECYSTECTOMY;  Surgeon: Georganna Skeans, MD;  Location: [place];  Service: General;  Laterality: N/A;  . IR ANGIO VERTEBRAL SEL SUBCLAVIAN INNOMINATE UNI R MOD SED  07/29/2020  . IR PERCUTANEOUS ART THROMBECTOMY/INFUSION INTRACRANIAL INC DIAG ANGIO  07/26/2020  . RADIOLOGY WITH ANESTHESIA N/A 07/26/2020   Procedure: IR WITH ANESTHESIA;  Surgeon: Radiologist, Medication, MD;  Location: [place];  Service: Radiology;  Laterality: N/A;    Social History:  reports that she has never smoked. She has never used smokeless tobacco. She reports  previous alcohol use. She reports previous drug use.  Allergies:  Allergies  Allergen Reactions  . Metformin And Related Diarrhea    Abdominal pain and diarrhea    Medications Prior to Admission  Medication Sig Dispense Refill  . acetaminophen (TYLENOL) 325 MG tablet Take 2-3 tablets (650-975 mg total) by mouth every 6 (six) hours as needed for mild pain.    Marland Kitchen apixaban (ELIQUIS) 5 MG TABS tablet Take 1 tablet (5 mg total) by mouth 2 (two) times daily. 60 tablet 3  . blood glucose meter kit and supplies KIT Dispense based on patient and insurance preference. Use up to four times daily as directed. (FOR ICD-10--E11.9). 1 each 0  . diltiazem (CARTIA XT) 240 MG 24 hr capsule Take 1 capsule (240 mg total) by mouth daily. 30 capsule 11  . insulin glargine (LANTUS) 100 UNIT/ML Solostar Pen Inject 20 Units into the skin daily. 15 mL 11  . Insulin Pen Needle (PEN NEEDLES) 32G X 4 MM MISC Use as directed with insulin pen 100 each 11  . metFORMIN (GLUCOPHAGE) 500 MG tablet Take 1 tablet (500 mg total) by mouth 2 (two) times daily with a meal. 60 tablet 11  . metoprolol tartrate (LOPRESSOR) 25 MG tablet Take 0.5 tablets (12.5 mg total) by mouth 2 (two) times daily. 60 tablet 2  . oxyCODONE (OXY IR/ROXICODONE) 5 MG immediate release tablet Take 1 tablet (5 mg total) by mouth every 6 (six) hours as needed for moderate pain or  severe pain (pain not releived by tylenol or ibuprofen). 10 tablet 0  . rosuvastatin (CRESTOR) 20 MG tablet TAKE 1 TABLET(20 MG) BY MOUTH DAILY (Patient taking differently: Take 20 mg by mouth daily. ) 30 tablet 0  . senna-docusate (SENOKOT-S) 8.6-50 MG tablet Take 2 tablets by mouth at bedtime. 60 tablet 0    Blood pressure 123/70, pulse 75, temperature 98.3 F (36.8 C), temperature source Oral, resp. rate 18, height 5' 2"  (1.575 m), weight 68.2 kg, SpO2 100 %. Physical Exam: General: pleasant, WD, WN female who is laying in bed in NAD HEENT: head is normocephalic, atraumatic.   Sclera are noninjected.  PERRL.  Ears and nose without any masses or lesions.  Mouth is pink dry Heart: regular, rate, and rhythm.  Normal s1,s2. No obvious murmurs, gallops, or rubs noted.  Palpable radial and pedal pulses bilaterally Lungs: CTAB, no wheezes, rhonchi, or rales noted.  Respiratory effort nonlabored Abd: soft, NT, mildly distended, +BS, rectal pouch with loose reddish-brown stool MS: mild edema of LUE, no movement noted, L fingers WWP; Patient moved R fingers and bilateral toes on her own Skin: warm and dry with no masses, lesions, or rashes Neuro: tracks with eyes intermittently, no attempt at verbal interaction, does not follow commands Psych: not assessable at this time   Results for orders placed or performed during the hospital encounter of 07/26/20 (from the past 48 hour(s))  Glucose, capillary     Status: Abnormal   Collection Time: 08/10/20  3:55 PM  Result Value Ref Range   Glucose-Capillary 128 (H) 70 - 99 mg/dL    Comment: Glucose reference range applies only to samples taken after fasting for at least 8 hours.  Glucose, capillary     Status: Abnormal   Collection Time: 08/10/20  8:14 PM  Result Value Ref Range   Glucose-Capillary 67 (L) 70 - 99 mg/dL    Comment: Glucose reference range applies only to samples taken after fasting for at least 8 hours.  Glucose, capillary     Status: Abnormal   Collection Time: 08/10/20  8:58 PM  Result Value Ref Range   Glucose-Capillary 168 (H) 70 - 99 mg/dL    Comment: Glucose reference range applies only to samples taken after fasting for at least 8 hours.  Glucose, capillary     Status: Abnormal   Collection Time: 08/10/20 11:57 PM  Result Value Ref Range   Glucose-Capillary 144 (H) 70 - 99 mg/dL    Comment: Glucose reference range applies only to samples taken after fasting for at least 8 hours.  Glucose, capillary     Status: Abnormal   Collection Time: 08/11/20  3:56 AM  Result Value Ref Range   Glucose-Capillary  108 (H) 70 - 99 mg/dL    Comment: Glucose reference range applies only to samples taken after fasting for at least 8 hours.  CBC     Status: Abnormal   Collection Time: 08/11/20  5:51 AM  Result Value Ref Range   WBC 21.3 (H) 4.0 - 10.5 K/uL   RBC 3.65 (L) 3.87 - 5.11 MIL/uL   Hemoglobin 7.7 (L) 12.0 - 15.0 g/dL    Comment: Reticulocyte Hemoglobin testing may be clinically indicated, consider ordering this additional test [id number]    HCT 26.3 (L) 36 - 46 %   MCV 72.1 (L) 80.0 - 100.0 fL   MCH 21.1 (L) 26.0 - 34.0 pg   MCHC 29.3 (L) 30.0 - 36.0 g/dL   RDW 18.2 (H) 11.5 -  15.5 %   Platelets 456 (H) 150 - 400 K/uL   nRBC 0.1 0.0 - 0.2 %    Comment: Performed at [facility], [place] [address] [place], [place] [address]  Basic metabolic panel     Status: Abnormal   Collection Time: 08/11/20  5:51 AM  Result Value Ref Range   Sodium 132 (L) 135 - 145 mmol/L   Potassium 5.3 (H) 3.5 - 5.1 mmol/L   Chloride 97 (L) 98 - 111 mmol/L   CO2 25 22 - 32 mmol/L   Glucose, Bld 139 (H) 70 - 99 mg/dL    Comment: Glucose reference range applies only to samples taken after fasting for at least 8 hours.   BUN 31 (H) 8 - 23 mg/dL   Creatinine, Ser 1.17 (H) 0.44 - 1.00 mg/dL   Calcium 8.4 (L) 8.9 - 10.3 mg/dL   GFR calc non Af Amer 47 (L) >60 mL/min   GFR calc Af Amer 54 (L) >60 mL/min   Anion gap 10 5 - 15    Comment: Performed at [facility] [address], [place], [place] [address]  Glucose, capillary     Status: Abnormal   Collection Time: 08/11/20  8:01 AM  Result Value Ref Range   Glucose-Capillary 168 (H) 70 - 99 mg/dL    Comment: Glucose reference range applies only to samples taken after fasting for at least 8 hours.  Glucose, capillary     Status: Abnormal   Collection Time: 08/11/20 12:37 PM  Result Value Ref Range   Glucose-Capillary 155 (H) 70 - 99 mg/dL    Comment: Glucose reference range applies only to samples taken after fasting for at least 8 hours.  Glucose,  capillary     Status: Abnormal   Collection Time: 08/11/20  4:20 PM  Result Value Ref Range   Glucose-Capillary 109 (H) 70 - 99 mg/dL    Comment: Glucose reference range applies only to samples taken after fasting for at least 8 hours.  Glucose, capillary     Status: Abnormal   Collection Time: 08/11/20  8:03 PM  Result Value Ref Range   Glucose-Capillary 116 (H) 70 - 99 mg/dL    Comment: Glucose reference range applies only to samples taken after fasting for at least 8 hours.   Comment 1 Notify RN    Comment 2 Document in Chart   Glucose, capillary     Status: Abnormal   Collection Time: 08/12/20 12:02 AM  Result Value Ref Range   Glucose-Capillary 159 (H) 70 - 99 mg/dL    Comment: Glucose reference range applies only to samples taken after fasting for at least 8 hours.   Comment 1 Notify RN    Comment 2 Document in Chart   Basic metabolic panel     Status: Abnormal   Collection Time: 08/12/20  1:51 AM  Result Value Ref Range   Sodium 131 (L) 135 - 145 mmol/L   Potassium 4.9 3.5 - 5.1 mmol/L   Chloride 98 98 - 111 mmol/L   CO2 25 22 - 32 mmol/L   Glucose, Bld 161 (H) 70 - 99 mg/dL    Comment: Glucose reference range applies only to samples taken after fasting for at least 8 hours.   BUN 30 (H) 8 - 23 mg/dL   Creatinine, Ser 1.17 (H) 0.44 - 1.00 mg/dL   Calcium 8.3 (L) 8.9 - 10.3 mg/dL   GFR calc non Af Amer 47 (L) >60 mL/min  GFR calc Af Amer 54 (L) >60 mL/min   Anion gap 8 5 - 15    Comment: Performed at [facility] [address], [place],  [address]  CBC with Differential/Platelet     Status: Abnormal   Collection Time: 08/12/20  1:51 AM  Result Value Ref Range   WBC 20.1 (H) 4.0 - 10.5 K/uL   RBC 3.61 (L) 3.87 - 5.11 MIL/uL   Hemoglobin 7.7 (L) 12.0 - 15.0 g/dL    Comment: Reticulocyte Hemoglobin testing may be clinically indicated, consider ordering this additional test [id number]    HCT 26.3 (L) 36 - 46 %   MCV 72.9 (L) 80.0 - 100.0 fL   MCH 21.3  (L) 26.0 - 34.0 pg   MCHC 29.3 (L) 30.0 - 36.0 g/dL   RDW 18.8 (H) 11.5 - 15.5 %   Platelets 471 (H) 150 - 400 K/uL   nRBC 0.1 0.0 - 0.2 %   Neutrophils Relative % 80 %   Neutro Abs 16.0 (H) 1.7 - 7.7 K/uL   Lymphocytes Relative 8 %   Lymphs Abs 1.7 0.7 - 4.0 K/uL   Monocytes Relative 8 %   Monocytes Absolute 1.7 (H) 0 - 1 K/uL   Eosinophils Relative 1 %   Eosinophils Absolute 0.1 0 - 0 K/uL   Basophils Relative 0 %   Basophils Absolute 0.1 0 - 0 K/uL   Immature Granulocytes 3 %   Abs Immature Granulocytes 0.57 (H) 0.00 - 0.07 K/uL    Comment: Performed at [facility], [place] [address] [place], [place] [address]  Glucose, capillary     Status: Abnormal   Collection Time: 08/12/20  3:47 AM  Result Value Ref Range   Glucose-Capillary 147 (H) 70 - 99 mg/dL    Comment: Glucose reference range applies only to samples taken after fasting for at least 8 hours.   Comment 1 Notify RN    Comment 2 Document in Chart   Glucose, capillary     Status: Abnormal   Collection Time: 08/12/20  7:49 AM  Result Value Ref Range   Glucose-Capillary 109 (H) 70 - 99 mg/dL    Comment: Glucose reference range applies only to samples taken after fasting for at least 8 hours.  Glucose, capillary     Status: Abnormal   Collection Time: 08/12/20 12:07 PM  Result Value Ref Range   Glucose-Capillary 153 (H) 70 - 99 mg/dL    Comment: Glucose reference range applies only to samples taken after fasting for at least 8 hours.   No results found.   Assessment/Plan HTN DM2 A.fib - previously on Eliquis, now held  Embolic stroke - R MCA and ACA infarct, s/p tPA and interventional revascularization 07/26/20  Acute respiratory failure - extubated 8/14  Dysphagia - secondary to stroke, making progress with speech therapy but their current recommendation remains NPO - will touch base to see if they have any idea on what timeline to expect further progress and if any studies are planned - no family at bedside  and no one answered phone for family members listed in contacts - will follow, but no procedure planned acutely at this time    Jill Alexanders, [facility] Surgery Please see Amion for pager number during day hours 7:00am-4:30pm 08/12/2020, 12:31 PM

## 2020-08-12 NOTE — TOC Initial Note (Signed)
Transition of Care [facility]) - Initial/Assessment Note    Patient Details  Name: Samantha Paul MRN: [id number] Date of Birth: 08-08-1946  Transition of [facility]) CM/SW Contact:    Pollie Friar, RN Phone Number: 08/12/2020, 4:31 PM  Clinical Narrative:                 CM met with MD, pt, pt spouse, interpreter. MD and CM went over that CIR was not an option d/t pts not able to tolerate the 3 hours of therapy a day. Spouse seems to understand. Plan is SNF. Spouse is agreeable to having the patient faxed out in [facility] area.  TOC following.  Expected Discharge Plan: Skilled Nursing Facility Barriers to Discharge: Continued Medical Work up   Patient Goals and CMS Choice   CMS Medicare.gov Compare Post Acute Care list provided to:: Patient Represenative (must comment) Choice offered to / list presented to : Spouse  Expected Discharge Plan and Services Expected Discharge Plan: [place] In-house Referral: Clinical Social Work Discharge Planning Services: CM Consult Post Acute Care Choice: [place] Living arrangements for the past 2 months: [place]                                      Prior Living Arrangements/Services Living arrangements for the past 2 months: Single Family Home Lives with:: Spouse Patient language and need for interpreter reviewed:: Yes        Need for Family Participation in Patient Care: Yes (Comment) Care giver support system in place?: No (comment)   Criminal Activity/Legal Involvement Pertinent to Current Situation/Hospitalization: No - Comment as needed  Activities of Daily Living   ADL Screening (condition at time of admission) Is the patient deaf or have difficulty hearing?: No Does the patient have difficulty seeing, even when wearing glasses/contacts?: No Does the patient have difficulty concentrating, remembering, or making decisions?: Yes Does the patient have difficulty dressing or bathing?:  Yes Does the patient have difficulty walking or climbing stairs?: Yes  Permission Sought/Granted                  Emotional Assessment Appearance:: Appears stated age         Psych Involvement: No (comment)  Admission diagnosis:  Stroke ([place]) [I63.9] Encounter for orogastric (OG) tube placement [Z46.59] Encounter for intubation [Z01.818] Acute respiratory failure with hypoxia ([place]) [J96.01] Cerebrovascular accident (CVA) due to thrombosis of precerebral artery ([place]) [I63.00] Stroke due to embolism ([place]) [I63.9] Middle cerebral artery embolism, right [I66.01] Patient Active Problem List   Diagnosis Date Noted  . Palliative care by specialist   . DNR (do not resuscitate) discussion   . Acute respiratory failure with hypoxia ([place])   . Stroke ([place]) 07/26/2020  . Stroke due to embolism ([place]) 07/26/2020  . Middle cerebral artery embolism, right 07/26/2020  . Sepsis ([place]) 06/29/2020  . DKA (diabetic ketoacidoses) ([place]) 06/29/2020  . ARF (acute renal failure) ([place]) 06/29/2020  . Hemiparesis affecting right side as late effect of stroke ([place])   . Acute blood loss anemia   . Left pontine stroke ([place]) 07/13/2018  . Essential hypertension   . Diabetes mellitus type 2 in nonobese (HCC)   . CVA (cerebral vascular accident) ([place]) 07/10/2018  . Hypertensive urgency 07/10/2018  . Hyperglycemia 07/10/2018  . Right sided weakness 07/10/2018  . Hyperlipidemia    PCP:  Elwyn Reach, MD  Pharmacy:   [facility] [place], [place] [address] [place] [address] Phone: [phone] Fax: [phone]  [facility] [place], [place] - [address] [place] [address] Phone: [phone] Fax: [phone]     Social Determinants of Health (SDOH) Interventions    Readmission Risk Interventions Readmission Risk Prevention Plan 07/03/2020  07/03/2020  Post Dischage Appt Complete -  Medication Screening Complete Complete  Transportation Screening Complete Complete

## 2020-08-12 NOTE — NC FL2 (Addendum)
[place] MEDICAID FL2 LEVEL OF CARE SCREENING TOOL     IDENTIFICATION  Patient Name: Samantha Paul Birthdate: 03-Dec-1946 Sex: female Admission Date (Current Location): 07/26/2020  [facility] and [place] Number:  Producer, television/film/video and Address:  [facility] [place], [address] [place], [place] [address]      Provider Number: [id number]  Attending Physician Name and Address:  Micki Riley, MD  Relative Name and Phone Number:       Current Level of Care: Hospital Recommended Level of Care: Skilled Nursing Facility Prior Approval Number:    Date Approved/Denied:   PASRR Number: [id number]  Discharge Plan: SNF    Current Diagnoses: Patient Active Problem List   Diagnosis Date Noted   Palliative care by specialist    DNR (do not resuscitate) discussion    Acute respiratory failure with hypoxia (HCC)    Stroke (HCC) 07/26/2020   Stroke due to embolism (HCC) 07/26/2020   Middle cerebral artery embolism, right 07/26/2020   Sepsis (HCC) 06/29/2020   DKA (diabetic ketoacidoses) (HCC) 06/29/2020   ARF (acute renal failure) (HCC) 06/29/2020   Hemiparesis affecting right side as late effect of stroke (HCC)    Acute blood loss anemia    Left pontine stroke (HCC) 07/13/2018   Essential hypertension    Diabetes mellitus type 2 in nonobese [facility])    CVA (cerebral vascular accident) (HCC) 07/10/2018   Hypertensive urgency 07/10/2018   Hyperglycemia 07/10/2018   Right sided weakness 07/10/2018   Hyperlipidemia     Orientation RESPIRATION BLADDER Height & Weight      (unable to assess)  Normal Incontinent Weight: 150 lb 5.7 oz (68.2 kg) Height:  5\' 2"  (157.5 cm)  BEHAVIORAL SYMPTOMS/MOOD NEUROLOGICAL BOWEL NUTRITION STATUS      Incontinent Feeding tube (Jevity 1.2)  AMBULATORY STATUS COMMUNICATION OF NEEDS Skin   Extensive Assist Non-Verbally Normal                       Personal Care Assistance Level of Assistance  Bathing, Feeding, Dressing  Bathing Assistance: Maximum assistance Feeding assistance: Maximum assistance Dressing Assistance: Maximum assistance     Functional Limitations Info  Speech     Speech Info: Impaired ([place])    SPECIAL CARE FACTORS FREQUENCY  PT (By licensed PT), OT (By licensed OT), Speech therapy     PT Frequency: 5x/wk OT Frequency: 5x/wk     Speech Therapy Frequency: 5x/wk      Contractures Contractures Info: Not present    Additional Factors Info  Code Status, Allergies, Insulin Sliding Scale Code Status Info: Full Allergies Info: Metformin And Related   Insulin Sliding Scale Info: 0-15 units every 4 hours; 5 units every 4 hours; Lantus 22 units daily       Current Medications (08/12/2020):  This is the current hospital active medication list Current Facility-Administered Medications  Medication Dose Route Frequency Provider Last Rate Last Admin    stroke: mapping our early stages of recovery book   Does not apply Once 08/14/2020, MD       0.9 %  sodium chloride infusion   Intravenous PRN Caryl Pina, MD 10 mL/hr at 08/09/20 0600 Rate Verify at 08/09/20 0600   0.9 %  sodium chloride infusion   Intravenous Continuous 08/11/20, MD 75 mL/hr at 08/10/20 1932 New Bag at 08/10/20 1932   acetaminophen (TYLENOL) tablet 650 mg  650 mg Oral Q4H PRN 08/12/20, MD  Or   acetaminophen (TYLENOL) 160 MG/5ML solution 650 mg  650 mg Per Tube Q4H PRN Julieanne Cotton, MD   650 mg at 08/10/20 0031   Or   acetaminophen (TYLENOL) suppository 650 mg  650 mg Rectal Q4H PRN Deveshwar, Simonne Maffucci, MD       aspirin chewable tablet 81 mg  81 mg Per Tube Daily Micki Riley, MD   81 mg at 08/12/20 0911   bethanechol (URECHOLINE) tablet 10 mg  10 mg Per Tube TID Hunsucker, Lesia Sago, MD   10 mg at 08/12/20 0911   chlorhexidine (PERIDEX) 0.12 % solution 15 mL  15 mL Mouth Rinse BID Crim, Toni Amend, MD   15 mL at 08/12/20 0911   Chlorhexidine Gluconate Cloth 2 % PADS 6 each  6 each Topical  Q0600 Caryl Pina, MD   6 each at 08/12/20 0618   diltiazem (CARDIZEM) 10 mg/ml oral suspension 90 mg  90 mg Per Tube Q6H Marvel Plan, MD   90 mg at 08/12/20 1017   feeding supplement (JEVITY 1.2 CAL) liquid 1,000 mL  1,000 mL Per Tube Continuous Cheri Fowler, MD 50 mL/hr at 08/09/20 0600 Rate Verify at 08/09/20 0600   feeding supplement (PROSource TF) liquid 45 mL  45 mL Per Tube Daily Micki Riley, MD   45 mL at 08/12/20 0911   heparin injection 5,000 Units  5,000 Units Subcutaneous Q8H Marvel Plan, MD   5,000 Units at 08/12/20 0618   insulin aspart (novoLOG) injection 0-15 Units  0-15 Units Subcutaneous Q4H Caryl Pina, MD   3 Units at 08/12/20 1221   insulin aspart (novoLOG) injection 5 Units  5 Units Subcutaneous Q4H Marvel Plan, MD   5 Units at 08/12/20 1221   insulin glargine (LANTUS) injection 22 Units  22 Units Subcutaneous Daily Doran Stabler, DO   22 Units at 08/12/20 0911   MEDLINE mouth rinse  15 mL Mouth Rinse q12n4p Crim, Courtney, MD   15 mL at 08/11/20 1644   metoprolol tartrate (LOPRESSOR) tablet 50 mg  50 mg Per Tube BID Doran Stabler, DO   50 mg at 08/12/20 0911   rosuvastatin (CRESTOR) tablet 20 mg  20 mg Per Tube Daily Ollis, Brandi L, NP   20 mg at 08/12/20 0911   senna-docusate (Senokot-S) tablet 1 tablet  1 tablet Oral QHS PRN Caryl Pina, MD         Discharge Medications: Please see discharge summary for a list of discharge medications.  Relevant Imaging Results:  Relevant Lab Results:   Additional Information SS#: [id number]  Baldemar Lenis, LCSW   I have personally obtained history,examined this patient, reviewed notes, independently viewed imaging studies, participated in medical decision making and plan of care.ROS completed by me personally and pertinent positives fully documented  I have made any additions or clarifications directly to the above note. Agree with note above.    Delia Heady, MD Medical Director [facility] Stroke  Center Pager: [phone] 08/13/2020 7:03 AM

## 2020-08-12 NOTE — Progress Notes (Signed)
                                                                                                                                                                                                           Daily Progress Note   Patient Name: Samantha Paul       Date: 08/12/2020 DOB: 1946/07/23  Age: 74 y.o. MRN#: [id number] Attending Physician: Micki Riley, MD Primary Care Physician: Rometta Emery, MD Admit Date: 07/26/2020   Patient Profile/HPI:  74 y.o.femalewith past medical history of previous stroke, uncontrolled DM2, and atrial fibrillation on Eliquiswho was admitted on 8/1/2021with left sided weakness and expressive aphasia. She was found to have a right sided CVA in her MCA and ACA. She received TPA. She underwent thrombectomy on 8/1.  She is arousable with dense left hemiplegia.   Patient was extubated on 08-08-20. Patient is weak and unable to handle oral secretions, unable to support herself nutritionally.  Consideration for PEG.  Family face ongoing treatment option decisions, advanced directive decisions and anticipatory care needs.   Attempted to contact husband via interpretor for f/u meeting and ongoing discussion regarding GOCS.    Spoke with charge nurse and was informed that family had meeting with attending team today.   Family remain open to all offered and available medical interventions to prolong life.  They are hopeful for improvement  Patient is high risk for decompensation, will try to set up meeting for early next week for ongoing palliative support.  No charge   Lorinda Creed NP  Palliative Medicine Team Team Phone # [phone] Pager [phone]

## 2020-08-13 ENCOUNTER — Inpatient Hospital Stay (HOSPITAL_COMMUNITY): Payer: Medicare Other

## 2020-08-13 LAB — IRON AND TIBC
Iron: 15 ug/dL — ABNORMAL LOW (ref 28–170)
Saturation Ratios: 5 % — ABNORMAL LOW (ref 10.4–31.8)
TIBC: 302 ug/dL (ref 250–450)
UIBC: 287 ug/dL

## 2020-08-13 LAB — CBC WITH DIFFERENTIAL/PLATELET
Abs Immature Granulocytes: 0.5 K/uL — ABNORMAL HIGH (ref 0.00–0.07)
Basophils Absolute: 0 K/uL (ref 0.0–0.1)
Basophils Relative: 0 %
Eosinophils Absolute: 0.5 K/uL (ref 0.0–0.5)
Eosinophils Relative: 1 %
HCT: 23.5 % — ABNORMAL LOW (ref 36.0–46.0)
Hemoglobin: 6.8 g/dL — CL (ref 12.0–15.0)
Lymphocytes Relative: 4 %
Lymphs Abs: 2 K/uL (ref 0.7–4.0)
MCH: 21.4 pg — ABNORMAL LOW (ref 26.0–34.0)
MCHC: 28.9 g/dL — ABNORMAL LOW (ref 30.0–36.0)
MCV: 73.9 fL — ABNORMAL LOW (ref 80.0–100.0)
Monocytes Absolute: 2.5 K/uL — ABNORMAL HIGH (ref 0.1–1.0)
Monocytes Relative: 5 %
Myelocytes: 1 %
Neutro Abs: 44.4 K/uL — ABNORMAL HIGH (ref 1.7–7.7)
Neutrophils Relative %: 89 %
Platelets: 399 K/uL (ref 150–400)
RBC: 3.18 MIL/uL — ABNORMAL LOW (ref 3.87–5.11)
RDW: 19.8 % — ABNORMAL HIGH (ref 11.5–15.5)
WBC: 49.9 K/uL — ABNORMAL HIGH (ref 4.0–10.5)
nRBC: 0 % (ref 0.0–0.2)
nRBC: 0 /100{WBCs}

## 2020-08-13 LAB — PROCALCITONIN: Procalcitonin: 0.6 ng/mL

## 2020-08-13 LAB — CBC
HCT: 22.6 % — ABNORMAL LOW (ref 36.0–46.0)
Hemoglobin: 6.6 g/dL — CL (ref 12.0–15.0)
MCH: 21.4 pg — ABNORMAL LOW (ref 26.0–34.0)
MCHC: 29.2 g/dL — ABNORMAL LOW (ref 30.0–36.0)
MCV: 73.4 fL — ABNORMAL LOW (ref 80.0–100.0)
Platelets: 359 10*3/uL (ref 150–400)
RBC: 3.08 MIL/uL — ABNORMAL LOW (ref 3.87–5.11)
RDW: 19.5 % — ABNORMAL HIGH (ref 11.5–15.5)
WBC: 49.9 10*3/uL — ABNORMAL HIGH (ref 4.0–10.5)
nRBC: 0 % (ref 0.0–0.2)

## 2020-08-13 LAB — BASIC METABOLIC PANEL
Anion gap: 10 (ref 5–15)
BUN: 35 mg/dL — ABNORMAL HIGH (ref 8–23)
CO2: 21 mmol/L — ABNORMAL LOW (ref 22–32)
Calcium: 8.2 mg/dL — ABNORMAL LOW (ref 8.9–10.3)
Chloride: 101 mmol/L (ref 98–111)
Creatinine, Ser: 1.37 mg/dL — ABNORMAL HIGH (ref 0.44–1.00)
GFR calc Af Amer: 45 mL/min — ABNORMAL LOW (ref >60)
GFR calc non Af Amer: 38 mL/min — ABNORMAL LOW (ref >60)
Glucose, Bld: 120 mg/dL — ABNORMAL HIGH (ref 70–99)
Potassium: 4.6 mmol/L (ref 3.5–5.1)
Sodium: 132 mmol/L — ABNORMAL LOW (ref 135–145)

## 2020-08-13 LAB — GLUCOSE, CAPILLARY
Glucose-Capillary: 112 mg/dL — ABNORMAL HIGH (ref 70–99)
Glucose-Capillary: 118 mg/dL — ABNORMAL HIGH (ref 70–99)
Glucose-Capillary: 132 mg/dL — ABNORMAL HIGH (ref 70–99)
Glucose-Capillary: 133 mg/dL — ABNORMAL HIGH (ref 70–99)
Glucose-Capillary: 145 mg/dL — ABNORMAL HIGH (ref 70–99)
Glucose-Capillary: 168 mg/dL — ABNORMAL HIGH (ref 70–99)

## 2020-08-13 LAB — LACTIC ACID, PLASMA: Lactic Acid, Venous: 1.5 mmol/L (ref 0.5–1.9)

## 2020-08-13 LAB — URINALYSIS, ROUTINE W REFLEX MICROSCOPIC
Bacteria, UA: NONE SEEN
Bilirubin Urine: NEGATIVE
Glucose, UA: NEGATIVE mg/dL
Ketones, ur: NEGATIVE mg/dL
Leukocytes,Ua: NEGATIVE
Nitrite: NEGATIVE
Protein, ur: 30 mg/dL — AB
RBC / HPF: >50 RBC/hpf — ABNORMAL HIGH (ref 0–5)
Specific Gravity, Urine: 1.011 (ref 1.005–1.030)
pH: 5 (ref 5.0–8.0)

## 2020-08-13 LAB — FERRITIN: Ferritin: 634 ng/mL — ABNORMAL HIGH (ref 11–307)

## 2020-08-13 LAB — APTT: aPTT: 38 seconds — ABNORMAL HIGH (ref 24–36)

## 2020-08-13 LAB — HEMOGLOBIN AND HEMATOCRIT, BLOOD
HCT: 26.7 % — ABNORMAL LOW (ref 36.0–46.0)
Hemoglobin: 8.1 g/dL — ABNORMAL LOW (ref 12.0–15.0)

## 2020-08-13 LAB — PROTIME-INR
INR: 1.2 (ref 0.8–1.2)
Prothrombin Time: 14.2 seconds (ref 11.4–15.2)

## 2020-08-13 LAB — OCCULT BLOOD X 1 CARD TO LAB, STOOL: Fecal Occult Bld: POSITIVE — AB

## 2020-08-13 LAB — PREPARE RBC (CROSSMATCH)

## 2020-08-13 MED ORDER — VANCOMYCIN HCL IN DEXTROSE 1-5 GM/200ML-% IV SOLN
1000.0000 mg | INTRAVENOUS | Status: DC
  Administered 2020-08-15: 1000 mg via INTRAVENOUS
  Filled 2020-08-13 (×2): qty 200

## 2020-08-13 MED ORDER — SODIUM CHLORIDE 0.9% IV SOLUTION: Freq: Once | INTRAVENOUS | Status: AC

## 2020-08-13 MED ORDER — SODIUM CHLORIDE 0.9 % IV SOLN
2.0000 g | Freq: Two times a day (BID) | INTRAVENOUS | Status: AC
  Administered 2020-08-13 – 2020-08-19 (×13): 2 g via INTRAVENOUS
  Filled 2020-08-13 (×13): qty 2

## 2020-08-13 MED ORDER — SODIUM CHLORIDE 0.9 % IV SOLN
1.5000 g | Freq: Three times a day (TID) | INTRAVENOUS | Status: DC
  Filled 2020-08-13 (×3): qty 4

## 2020-08-13 MED ORDER — VANCOMYCIN HCL 1250 MG/250ML IV SOLN
1250.0000 mg | Freq: Once | INTRAVENOUS | Status: AC
  Administered 2020-08-13: 1250 mg via INTRAVENOUS
  Filled 2020-08-13: qty 250

## 2020-08-13 NOTE — Sepsis Progress Note (Signed)
Notified bedside nurse of need to draw lactic acid and give antibiotic asap. Marland Kitchen

## 2020-08-13 NOTE — Progress Notes (Signed)
Pharmacy Antibiotic Note  Samantha Paul is a 74 y.o. female admitted on 07/26/2020 with embolic occlusion of R-carotid terminus s/p TPA and IR revascularization. now with new fever up to 102.5, significantly worse leukocytosis, and tachycardia concerning for sepsis of unknown cause. CXR was negative for pneumonia. UA with hematuria. Blood and urine cultures sent. Pharmacy has been consulted for Cefepime and Vancomycin dosing.  WBC is significantly elevated at 49.9. SCr is rising again at 1.37 with estimated CrCl ~33 mL/min. Of note, patient completed 9 day course of antibiotics on 8/15 for Klebsiella pneumonia, now with rectal tube and output yesterday (increased amount from prior charting). Also, patient has Hgb down to 6.6 with hematuria and dark brown stool.   Plan: Cefepime 2g IV every 12 hours.  Vancomycin 1250 mg IV x1 then 1000 mg IV every 36 hours.  Monitor serum creatinine, clinical status, and culture results  Height: 5\' 2"  (157.5 cm) Weight: 68.2 kg (150 lb 5.7 oz) IBW/kg (Calculated) : 50.1  Temp (24hrs), Avg:100.2 F (37.9 C), Min:98.1 F (36.7 C), Max:102.5 F (39.2 C)  Recent Labs  Lab 08/09/20 0327 08/10/20 0239 08/11/20 0551 08/12/20 0151 08/13/20 1352  WBC 20.2* 22.3* 21.3* 20.1* 49.9*  CREATININE 1.26* 1.25* 1.17* 1.17* 1.37*    Estimated Creatinine Clearance: 33.6 mL/min (A) (by C-G formula based on SCr of 1.37 mg/dL (H)).    Allergies  Allergen Reactions   Metformin And Related Diarrhea    Abdominal pain and diarrhea    Antimicrobials this admission: Zosyn 8/7>>8/9 CTX 8/9>> 8/15 Vanc 8/19 >> Cefepime 8/19 >>  Dose adjustments this admission:   Microbiology results: 8/1 MRSA PCR negative 8/1 COVID negative 8/8 TA - rare Kleb pneumo (pan-S except R-amp) 8/7 BCx - negative 8/9 UCx - negative  Thank you for allowing pharmacy to be a part of this patients care.  10/9, PharmD, BCPS, BCCCP Clinical Pharmacist Please refer to [facility]  for [facility] numbers 08/13/2020 4:10 PM

## 2020-08-13 NOTE — Progress Notes (Addendum)
Hgb 6.6, WBC 49.9, febrile >101 (102.5). HR 105. RR 23. BMET pending. Bld Cx pending.  UA no bacteria, 0-5 WBC. > 50 RBCs.  Per RRT this am, pt w/ urinary retention (voided spontaneously then I&O cath for 1800). She mentioned there was blood in the urine.   CXR no consolidation though exam limited. (note widening of L glenohumeral joint - needs further eval once stable).  Suspect aspiration PNA given cortrak feeding, though source unclear.  Discussed above w/ Dr. Pearlean Brownie  Consulted TRH. Started Unasyn. Added lactic acid to labs drawn this am. Code sepsis order placed   Annie Main, MSN, APRN, ANVP-BC, AGPCNP-BC Advanced Practice Stroke Nurse [facility] See Amion for Schedule & Pager information 08/13/2020 2:59 PM

## 2020-08-13 NOTE — Progress Notes (Signed)
Nutrition Follow-up  DOCUMENTATION CODES:   Not applicable  INTERVENTION:  Continue Tube feeding via Cortrak tube: Jevity1.2 at 50 ml/h(1200 ml per day) Prosource TF 45 ml daily  Provides 1480 kcal, 77 gm protein, 973 ml free water daily   NUTRITION DIAGNOSIS:   Inadequate oral intake related to inability to eat as evidenced by NPO status.  ongoing  GOAL:   Patient will meet greater than or equal to 90% of their needs  Met with TF   MONITOR:   TF tolerance, Diet advancement, Labs, Weight trends, I & O's  REASON FOR ASSESSMENT:   Consult, Ventilator Enteral/tube feeding initiation and management  ASSESSMENT:   Pt with PMH of HTN, afib, DM admitted with R MCA stroke s/p tPA and thrombectomy.  8/1 pt intubated 8/4 Cortrak placed (gastric) 8/14 pt extubated  Per MD, pt not accepted into CIR; will likely need SNF. SLP continues to recommend pt be NPO. Trauma surgery consulted for PEG placement.   Current TF: Jevity1.2 at 50 ml/h, Prosource TF 45 ml daily Provides 1480 kcal, 77 gm protein, 973 ml free water daily  Labs: Na 131 (L), CBGs 145-168 Medications: Novolog, Lantus  Diet Order:   Diet Order    None      EDUCATION NEEDS:   No education needs have been identified at this time  Skin:  Skin Assessment: Reviewed RN Assessment  Last BM:  8/18 type 7 (84m via rectal tube)  Height:   Ht Readings from Last 1 Encounters:  07/26/20 _0  (1.575 m)    Weight:   Wt Readings from Last 1 Encounters:  08/12/20 68.2 kg    Ideal Body Weight:  50 kg  BMI:  Body mass index is 27.5 kg/m.  Estimated Nutritional Needs:   Kcal:  1500-1700  Protein:  75-90 grams  Fluid:  >1.5 L/day    ALarkin Ina MS, RD, LDN RD pager number and weekend/on-call pager number located in [place]

## 2020-08-13 NOTE — Progress Notes (Signed)
   08/13/20 0848  Vitals  Temp (!) 102.5 F (39.2 C) (RN notified)  Temp Source Axillary  BP (!) 141/75  MAP (mmHg) 86  BP Location Left Arm  BP Method Automatic  Patient Position (if appropriate) Lying  Pulse Rate 82  Pulse Rate Source Dinamap  Resp 20  MEWS COLOR  MEWS Score Color Yellow  Oxygen Therapy  SpO2 100 %  O2 Device Nasal Cannula  MEWS Score  MEWS Temp 2  MEWS Systolic 0  MEWS Pulse 0  MEWS RR 0  MEWS LOC 0  MEWS Score 2  pt unresponsive in no apparent distress,Prn tylenel given.Attending MD notified, still awaiting response. Closely monitored.

## 2020-08-13 NOTE — Progress Notes (Signed)
   08/13/20 0848  Vitals  Temp (!) 102.5 F (39.2 C) (RN notified)  Temp Source Axillary  BP (!) 141/75  MAP (mmHg) 86  BP Location Left Arm  BP Method Automatic  Patient Position (if appropriate) Lying  Pulse Rate 82  Pulse Rate Source Dinamap  Resp 20  MEWS COLOR  MEWS Score Color Yellow  Oxygen Therapy  SpO2 100 %  O2 Device Nasal Cannula  MEWS Score  MEWS Temp 2  MEWS Systolic 0  MEWS Pulse 0  MEWS RR 0  MEWS LOC 0  MEWS Score 2  This writer has been informed by NT pt has a temp of 102.5, Seen pt in room. Pt arousable to voice, in no apparent distress.  Prn tylenol given , DON made aware, and attending MD has been notified and awaiting a response.

## 2020-08-13 NOTE — Progress Notes (Signed)
CRITICAL VALUE ALERT  Critical Value:  Hgb 6.6  Date & Time Notied:  08/13/20  Provider Notified: Elmon Else  Orders Received/Actions taken: still awaiting a response.

## 2020-08-13 NOTE — Progress Notes (Signed)
Patient ID: Samantha Paul, female   DOB: [birth year], 74 y.o.   MRN: [id number] Patient seen with Dr. Pearlean Brownie, husband and interpreter. Fever today 102.5 and she is undergoing W/U for that. Will schedule for PEG tomorrow and plan that pending fever W/U. May need to delay until Monday. I discussed the procedure, risks,and benefits with her husband via the interpreter and got consent.   Samantha Gelinas, MD, MPH, FACS Please use [email or web address] to contact on call provider

## 2020-08-13 NOTE — Consult Note (Signed)
Medical Consultation   Samantha Paul  WGN:[id number]  DOB: 01/23/1946  DOA: 07/26/2020  PCP: Rometta Emery, MD   Requesting physician: Dr. Pearlean Brownie  Reason for consultation: sepsis   History of Present Illness: Samantha Paul is an 74 y.o. female with past medical history of hypertension, hyperlipidemia, diabetes mellitus, A. fib, stroke presented on 07/26/2020 with right gaze deviation, left-sided hemiplegia and aphasia.  CTA head and neck showed embolic occlusion of right carotid terminus, minimal flows seen in right MCA territory and cor infarct right frontal operculum.  Patient is status post TPA and underwent interventional revascularization.  She was intubated for IR and then extubated on 8/14.  She is getting tube feeds via Cortrak due to dysphagia.  Trauma surgery consulted for PEG tube placement.  She has been on 3 L after the extubation.  Pt was on Zosyn 8/7-8/9.  Respiratory culture grew Klebsiella pneumonia (pansensitive) therefore Zosyn was discontinued and patient was started on Rocephin from 8/9-8/15.  Patient was supposed to get PEG tube this morning however due to fever/sepsis-procedure was rescheduled for tomorrow.  On 08/13/2020 patient developed fever of 102.5, tachycardic, tachypneic, on 3 L, chest x-ray negative for pneumonia, UA positive for RBCs more than 50, no leukocytes/nitrites, CBC shows leukocytosis of 49.9, H&H: 6.6/22.6, blood culture/urine culture/lactic acid ordered.  [facility] hospitalist consulted for further evaluation and management of sepsis.  Upon my evaluation: Patient's husband at bedside.  Patient resting comfortably on the bed, on 3 L of oxygen via nasal cannula, not in respiratory distress, opens her eyes however not following commands. No femoral hematoma noted.  Pt has rectal tube- loose, dark greenish/brown color, very foul-smelling stool noted.  Unfortunately RN does not know total rectal output in last 24 hours & does not know how urine was  collected.   Past Medical History:  Diagnosis Date  . Afib (HCC) 06/2020   Diagnosed 06/2020, on cardizem, metoprolol, eliquis  . Back pain   . CVA (cerebral vascular accident) (HCC) 07/26/2020   R MCA/ACA CVA  . DM II (diabetes mellitus, type II), controlled (HCC)   . HTN (hypertension)   . Knee pain   . Stroke [facility]) ?   residual mild right sided weakness          Review of Systems:  ROS As per HPI otherwise 10 point review of systems negative.     Past Medical History: Past Medical History:  Diagnosis Date  . Afib (HCC) 06/2020   Diagnosed 06/2020, on cardizem, metoprolol, eliquis  . Back pain   . CVA (cerebral vascular accident) (HCC) 07/26/2020   R MCA/ACA CVA  . DM II (diabetes mellitus, type II), controlled (HCC)   . HTN (hypertension)   . Knee pain   . Stroke [facility]) ?   residual mild right sided weakness     Past Surgical History: Past Surgical History:  Procedure Laterality Date  . CHOLECYSTECTOMY N/A 06/30/2020   Procedure: LAPAROSCOPIC CHOLECYSTECTOMY;  Surgeon: [name], MD;  Location: [facility] OR;  Service: General;  Laterality: N/A;  . IR ANGIO VERTEBRAL SEL SUBCLAVIAN INNOMINATE UNI R MOD SED  07/29/2020  . IR PERCUTANEOUS ART THROMBECTOMY/INFUSION INTRACRANIAL INC DIAG ANGIO  07/26/2020  . RADIOLOGY WITH ANESTHESIA N/A 07/26/2020   Procedure: IR WITH ANESTHESIA;  Surgeon: Radiologist, Medication, MD;  Location: [facility] OR;  Service: Radiology;  Laterality: N/A;     Allergies:   Allergies  Allergen Reactions  .  Metformin And Related Diarrhea    Abdominal pain and diarrhea     Social History:  reports that she has never smoked. She has never used smokeless tobacco. She reports previous alcohol use. She reports previous drug use.   Family History: Family History  Problem Relation Age of Onset  . Arthritis Brother       Physical Exam: Vitals:   08/13/20 0929 08/13/20 0936 08/13/20 1000 08/13/20 1200  BP: (!) 142/72 134/79 115/61 133/67   Pulse: 100 74 100 95  Resp: 20 20 (!) 23 20  Temp: (!) 101.4 F (38.6 C) (!) 101.8 F (38.8 C) 99.3 F (37.4 C) 98.6 F (37 C)  TempSrc: Oral Axillary Oral Oral  SpO2: 100% 100% 100% 100%  Weight:      Height:        Constitutional: Resting comfortably, on 3 L of oxygen via nasal cannula, opens her eyes however not following commands. Eyes: PERRL ENMT: external ears and nose appear normal, normal hearing or hard of hearing            Lips appears normal, oropharynx mucosa, tongue, posterior pharynx appear normal  Neck: neck appears normal, no masses, normal ROM, no thyromegaly, no JVD  CVS: tachycardic no murmur rubs or gallops, no LE edema, normal pedal pulses  Respiratory:  clear to auscultation bilaterally, no wheezing, rales or rhonchi. Respiratory effort normal. No accessory muscle use.  Abdomen: soft nontender, nondistended, normal bowel sounds, no hepatosplenomegaly, no hernias  Musculoskeletal: : no cyanosis, clubbing or edema noted bilaterally                       Joint/bones/muscle exam, strength, contractures or atrophy Neuro: Opens her eyes, not following commands, aphasic  Skin: bruises noted on lower abdomen skin   Data reviewed:  I have personally reviewed following labs and imaging studies Labs:  CBC: Recent Labs  Lab 08/09/20 0327 08/10/20 0239 08/11/20 0551 08/12/20 0151 08/13/20 1352  WBC 20.2* 22.3* 21.3* 20.1* 49.9*  NEUTROABS  --   --   --  16.0*  --   HGB 8.1* 7.5* 7.7* 7.7* 6.6*  HCT 27.1* 25.6* 26.3* 26.3* 22.6*  MCV 70.0* 69.9* 72.1* 72.9* 73.4*  PLT 447* 491* 456* 471* 359    Basic Metabolic Panel: Recent Labs  Lab 08/09/20 0327 08/09/20 0327 08/10/20 0239 08/10/20 0239 08/11/20 0551 08/11/20 0551 08/12/20 0151 08/13/20 1352  NA 133*  --  128*  --  132*  --  131* 132*  K 4.7   < > 4.9   < > 5.3*   < > 4.9 4.6  CL 98  --  95*  --  97*  --  98 101  CO2 24  --  24  --  25  --  25 21*  GLUCOSE 101*  --  199*  --  139*  --  161*  120*  BUN 30*  --  30*  --  31*  --  30* 35*  CREATININE 1.26*  --  1.25*  --  1.17*  --  1.17* 1.37*  CALCIUM 8.6*  --  8.2*  --  8.4*  --  8.3* 8.2*   < > = values in this interval not displayed.   GFR Estimated Creatinine Clearance: 33.6 mL/min (A) (by C-G formula based on SCr of 1.37 mg/dL (H)). Liver Function Tests: No results for input(s): AST, ALT, ALKPHOS, BILITOT, PROT, ALBUMIN in the last 168 hours. No results for input(s):  LIPASE, AMYLASE in the last 168 hours. No results for input(s): AMMONIA in the last 168 hours. Coagulation profile No results for input(s): INR, PROTIME in the last 168 hours.  Cardiac Enzymes: No results for input(s): CKTOTAL, CKMB, CKMBINDEX, TROPONINI in the last 168 hours. BNP: Invalid input(s): POCBNP CBG: Recent Labs  Lab 08/12/20 1931 08/12/20 2314 08/13/20 0308 08/13/20 0845 08/13/20 1206  GLUCAP 113* 152* 168* 145* 118*   D-Dimer No results for input(s): DDIMER in the last 72 hours. Hgb A1c No results for input(s): HGBA1C in the last 72 hours. Lipid Profile No results for input(s): CHOL, HDL, LDLCALC, TRIG, CHOLHDL, LDLDIRECT in the last 72 hours. Thyroid function studies No results for input(s): TSH, T4TOTAL, T3FREE, THYROIDAB in the last 72 hours.  Invalid input(s): FREET3 Anemia work up No results for input(s): VITAMINB12, FOLATE, FERRITIN, TIBC, IRON, RETICCTPCT in the last 72 hours. Urinalysis    Component Value Date/Time   COLORURINE YELLOW 08/13/2020 1046   APPEARANCEUR CLEAR 08/13/2020 1046   LABSPEC 1.011 08/13/2020 1046   PHURINE 5.0 08/13/2020 1046   GLUCOSEU NEGATIVE 08/13/2020 1046   HGBUR LARGE (A) 08/13/2020 1046   BILIRUBINUR NEGATIVE 08/13/2020 1046   KETONESUR NEGATIVE 08/13/2020 1046   PROTEINUR 30 (A) 08/13/2020 1046   NITRITE NEGATIVE 08/13/2020 1046   LEUKOCYTESUR NEGATIVE 08/13/2020 1046     Microbiology No results found for this or any previous visit (from the past 240  hour(s)).     Inpatient Medications:   Scheduled Meds: .  stroke: mapping our early stages of recovery book   Does not apply Once  . sodium chloride   Intravenous Once  . aspirin  81 mg Per Tube Daily  . bethanechol  10 mg Per Tube TID  . chlorhexidine  15 mL Mouth Rinse BID  . diltiazem  90 mg Per Tube Q6H  . feeding supplement (PROSource TF)  45 mL Per Tube Daily  . insulin aspart  0-15 Units Subcutaneous Q4H  . insulin aspart  5 Units Subcutaneous Q4H  . insulin glargine  22 Units Subcutaneous Daily  . mouth rinse  15 mL Mouth Rinse q12n4p  . metoprolol tartrate  50 mg Per Tube BID  . rosuvastatin  20 mg Per Tube Daily   Continuous Infusions: . sodium chloride 10 mL/hr at 08/09/20 0600  . sodium chloride 75 mL/hr at 08/13/20 0614  . feeding supplement (JEVITY 1.2 CAL) 1,000 mL (08/12/20 2232)     Radiological Exams on Admission: DG CHEST PORT 1 VIEW  Result Date: 08/13/2020 CLINICAL DATA:  Febrile with leukocytosis. Dysphagia due to recent cerebrovascular accident. EXAM: PORTABLE CHEST 1 VIEW COMPARISON:  08/05/2020 FINDINGS: Feeding tube tip projects at the gastric cardia, similar to prior. Cardiomediastinal silhouette is similar to prior. Similar mild elevation the right hemidiaphragm. No consolidation. Linear bibasilar opacities, favored to represent atelectasis. No sizable pleural effusion or discernible pneumothorax. Biapical pleuroparenchymal scarring. The visualized skeletal structures are unremarkable. Apparent widening of the left glenohumeral joint. IMPRESSION: 1. No evidence of consolidation, although portable technique limits evaluation of the lung bases. Dedicated PA and lateral radiographs could better evaluate if the patient is able. 2. Apparent widening of the left glenohumeral joint. Consider dedicated left shoulder radiographs to further evaluate. Electronically Signed   By: Feliberto Harts MD   On: 08/13/2020 11:54    Impression/Recommendations Principal  Problem:   Sepsis (HCC) Active Problems:   Hyperlipidemia   Essential hypertension   Diabetes mellitus type 2 in nonobese (HCC)   Acute  blood loss anemia   Stroke [facility](HCC)   Stroke due to embolism [facility](HCC)   Middle cerebral artery embolism, right   Acute respiratory failure with hypoxia (HCC)   DNR (do not resuscitate) discussion   Palliative care by specialist   Sepsis: -Differential diagnosis includes: HCAP/aspiration pneumonia versus C. Difficile colitis.  She is hospitalized for more than 18 days.  Was on IV antibiotics Zosyn and Rocephin. -She is febrile with fever of 102.5, tachycardic, tachypneic, has leukocytosis of 49.9. was on antiobiotics before.  UA positive for RBC>50 lactic acid, blood culture, urine culture, procalcitonin level: Pending -We will start patient on broad-spectrum antibiotics Vanco and cefepime for HCAP for now -Discussed with ID Dr. Gwen HerKees Van Dam. Will Check C. Difficile. -We will get CT chest, abdomen, pelvis -Tylenol as needed for fever more than 100.4. -Monitor vitals closely.  Acute blood loss anemia: -We will check POC occult blood.  H&H is 6.6/22.6, MCV: 73.4.  Check iron studies.  Transfuse 1 unit PRBC. -Monitor H&H closely. -We will get CT abdomen/pelvis to check for retroperitoneal hematoma?  Type 2 diabetes mellitus: Continue sliding scale insulin and Lantus 22 units daily -Monitor blood sugar closely.  A. Fib: -Continue diltiazem and metoprolol -On Eliquis prior to admission-hold for now due to petechial hemorrhages noted on CT.  Low care Dr. Lindie SpruceWyatt thank you so much for calling me back high I am calling regarding this patient I called to consult on this patient she is here for  Hypertension: Continue metoprolol  Right MCA & ACA stroke: S/p TPA and revascularization by IR: Continue aspirin, statin  Acute respiratory failure with hypoxia: Patient intubated for IR procedure and extubated on 8/14.  Currently requiring 3 L of oxygen via nasal  cannula.  Dysphagia: Secondary to stroke: -On cortrak tube feedings. -Plan is for PEG tube placement this morning.  Rescheduled for tomorrow AM.  Hyperlipidemia: Continue statin  Patient's condition: Extremely guarded.  I discussed this with patient's husband at bedside and he verbalized understanding.  Thank you for this consultation.  [facility] will follow the patient with you.   Time Spent: 40 minutes  Ollen Bowlinka R Alann Avey M.D. [facility] 08/13/2020, 4:00 PM

## 2020-08-13 NOTE — TOC CAGE-AID Note (Signed)
Transition of Care [facility]) - CAGE-AID Screening   Patient Details  Name: Samantha Paul MRN: [id number] Date of Birth: Jan 23, 1946  Transition of Care [facility]) CM/SW Contact:    Jimmy Picket, [place] Phone Number: 08/13/2020, 10:35 AM   Clinical Narrative:  Pt is unable to participate in assessment.   CAGE-AID Screening: Substance Abuse Screening unable to be completed due to: : Patient unable to participate            Isabella Stalling Clinical Social Worker [phone]

## 2020-08-13 NOTE — Significant Event (Addendum)
Rapid Response Event Note   Reason for Call :  MEWS: fever, tachycardia  Initial Focused Assessment:  Pt lying in bed. Febrile, lethargic. Pt will open eyes, but not sustained. Upper air secretions noted. No increase work of breathing. Lung sounds are rhonchus throughout, difficult to assess due to upper airway secretions. Skin is hot, moist.   VS: T 101.67F, BP 134/79, HR 74, RR 20, SpO2 100% on 3LNC CBG: 145 this morning  Interventions:  -NTS -Oral care -I/O cath with 1800 cc of urine return following pt having spontaneously voiding. Concern for urine retention.   -Ice packs in place and excess linen removed from bed. -PRNs given for fever  Plan of Care:  -CBC, BMP, Blood cultures, urine cultures, CXR -Maintain ice packs until fever breaks -Continue to monitor pt respiratory effort and mentation -Bladder scan for urinary retention  Call rapid response for additional needs.  Addendum 1445: Rounded back on pt. Pt resting. Skin is warm, dry. Fever improved with PRN, please continue to monitor. Pt breathing is unlabored. No upper airway secretions. Pt opens eyes to voice. Code sepsis initiated by provider.   Event Summary:  MD Notified: Dr. Pearlean Brownie updated, new orders placed. Call Time: 0931 Arrival Time: 0935 End Time: 1010  Jennye Moccasin, RN

## 2020-08-13 NOTE — Progress Notes (Signed)
STROKE TEAM PROGRESS NOTE   INTERVAL HISTORY Her husband and [place] language interpreter and Dr Janee Morn are at the bedside   .  She spiked temperature 102.5 today but appears not to be in respiratory distress and looks comfortable.  Blood cultures, urine and chest x-ray have been ordered.  Temperature came down with Tylenol to 101.4.  Neuro exam is unchanged.  Patient has been turned down by inpatient rehab and will likely need to go to skilled nursing facility.  She appears to be tachycardic but neuro exam is unchanged  Vitals:   08/13/20 0929 08/13/20 0936 08/13/20 1000 08/13/20 1200  BP: (!) 142/72 134/79 115/61 133/67  Pulse: 100 74 100 95  Resp: 20 20 (!) 23 20  Temp: (!) 101.4 F (38.6 C) (!) 101.8 F (38.8 C) 99.3 F (37.4 C) 98.6 F (37 C)  TempSrc: Oral Axillary Oral Oral  SpO2: 100% 100% 100% 100%  Weight:      Height:       CBC:  Recent Labs  Lab 08/11/20 0551 08/12/20 0151  WBC 21.3* 20.1*  NEUTROABS  --  16.0*  HGB 7.7* 7.7*  HCT 26.3* 26.3*  MCV 72.1* 72.9*  PLT 456* 471*   Basic Metabolic Panel:  Recent Labs  Lab 08/11/20 0551 08/12/20 0151  NA 132* 131*  K 5.3* 4.9  CL 97* 98  CO2 25 25  GLUCOSE 139* 161*  BUN 31* 30*  CREATININE 1.17* 1.17*  CALCIUM 8.4* 8.3*   IMAGING past 24 hours DG CHEST PORT 1 VIEW  Result Date: 08/13/2020 CLINICAL DATA:  Febrile with leukocytosis. Dysphagia due to recent cerebrovascular accident. EXAM: PORTABLE CHEST 1 VIEW COMPARISON:  08/05/2020 FINDINGS: Feeding tube tip projects at the gastric cardia, similar to prior. Cardiomediastinal silhouette is similar to prior. Similar mild elevation the right hemidiaphragm. No consolidation. Linear bibasilar opacities, favored to represent atelectasis. No sizable pleural effusion or discernible pneumothorax. Biapical pleuroparenchymal scarring. The visualized skeletal structures are unremarkable. Apparent widening of the left glenohumeral joint. IMPRESSION: 1. No evidence of  consolidation, although portable technique limits evaluation of the lung bases. Dedicated PA and lateral radiographs could better evaluate if the patient is able. 2. Apparent widening of the left glenohumeral joint. Consider dedicated left shoulder radiographs to further evaluate. Electronically Signed   By: Feliberto Harts MD   On: 08/13/2020 11:54    PHYSICAL EXAM  General - Well nourished, well developed elderly Asian lady, not in distress  Ophthalmologic - fundi not visualized due to noncooperation.  Cardiovascular - irregularly irregular heart rate and rhythm.  Neuro -  , eyes are open with voice and maintained opening.  Able to follow limited midline and peripheral commands on the right,  .  Eyes in right gaze position, barely cross midline. Not blinking to visual threat on the left but blinking on the right, doll's eyes present but not cross midline, PERRL. Corneal reflex present, gag and cough present. Breathing over the vent.  Left lower facial weakness.  Tongue protrusion not cooperative. On pain stimulation, active movement of RUE and RLE with RUE at least 4/5 and RLE at least 3-/5. LUE 0/5 and LLE 2-/5 withdraw on pain. DTR 1+ and bilateral positive babinski. Sensation not cooperative but brisk response to painful stimulation, coordination not cooperative and gait not tested.   ASSESSMENT/PLAN [name] is a 74 y.o. female with history of atrial fibrillation, HTN, DM and stroke presenting with right gaze deviation, left-sided hemiplegia and dense receptive and expressive aphasia.  She received tPA 07/26/2020 at 1259. Taken to IR for R ICA T occlusion.  Stroke:   R MCA and ACA infarct due to R tICA occlusion s/p tPA and IR w/ TICI2c-3, embolic secondary to known AF but not on AC for at least a week  Code Stroke CT head No acute abnormality.     CTA head & neck R tICA occlusion w/ min MCA flow. Some narrowing B VA and BA.   CT perfusion R frontal core 16 cc with penumbra 136 R MCA  territory  Cerebral angio RT ICA T occlusion ->TICI 2C revascularization of RT MCA and a TICI 3 revascularization of RT ACA .  CT head post IR - new R frontal lobe hyper and hypointensity (infarct, edema, petechial hemorrhage)  MRI right MCA and ACA multifocal large infarcts with petechial hemo  CT repeat stable right MCA multifocal infarcts, minimal MLS.  2D Echo 07/03/2020 EF 65-70%. No source of embolus   LDL 90  HgbA1c 12.7  VTE prophylaxis - heparin subq   Eliquis (apixaban) daily prior to admission, now on aspirin 81 mg. Hold AC for now given petechial hemorrhage, large infarct and potential procedure.  Therapy recommendations: SNF  Disposition:  Pending - full code    Acute Respiratory Failure, improved  Secondary to stroke  Intubated for IR, left intubated post IR   Extubated 8/14   Stable on the floor   Atrial Fibrillation w/ RVR  Home anticoagulation:  Eliquis (apixaban) daily - not on for at least a week  On diltiazem 240, metoprolol 12.5 bid  PTA  Now on diltiazem 60 mg Q6 -> 90 Q6  metoprolol 50 mg Bid -> 100 bid -> 50 bid No AC at this time given petechial hemorrhage, large infarct and inability to swallow.  Continue aspirin for now and start Eliquis after PEG tube or when able to swallow Hypotension  Home meds:  None, no hx HTN . BP goal < 160 now . BP stable now . On metoprolol 100 bid -> 50 bid . Long-term BP goal normotensive  Hyperlipidemia  Home meds:  crestor 20, resumed in hospital  LDL 90, goal < 70  Continue statin at discharge  Diabetes type II Uncontrolled  Home meds:  lantus 20 daily, metformin 500 bid  HgbA1c 12.7, goal < 7.0  CBGs  SSI  Hypoglycemia much improved  DM coordinator assistance appreciated  Continue lantus 20->22, novolog 5U Q4h  Dysphagia . Secondary to stroke . NPO . Has cortrak . On TF @50  . Speech on board   Fever, leukocytosis  Tmax 102.3->101.2->100.6->101.6->101->afebrile  CXR  unremarkable  UA WBC 21-50  U Cx insignificant growth  B Cx NGTD  Respiratory culture - klebsiella pneumo (pan sensitive)  WBC 14.7->20.6->26.7->22.8->25.3->21.8->22.7->19.2->20.2->22.3  Zosyn 8/7>>8/9  Rocephin 8/9>>8/15  Monitor labs  AKI  creatinine - 0.76->1.40->1.56->1.76->1.56->1.49-> 1.40->1.31->1.26->1.25  On TF @ 50 + Pro Source 45 ml daily  Off IVF  Continue BMP monitoring  Hyponatremia, improving  Na 134->133->126->128->133->134->131->133->128  Partially due to hyperglycemia  Close monitoring  Hx of stroke  06/2018 left pontine infarct likely secondary to small vessel disease source. CTA head and neck neg. EF 65-70%. LDL 140 and A1C 12.3. DAPT x 3 weeks and lipitor 40.  Other Stroke Risk Factors  Advanced age  Other Active Problems  Microcytic anemia 10.2->9.4->9.3->8.8->8.7->8.5->8.2->8.8->8.1->7.5 - monitor  Palliative Care - family meeting  08/08/20 ->   Rectal tube  Hospital day # 18 Continue ongoing therapies and Panda tube feeding.  I  had a long discussion with the patient's husband using [place] language interpreter and discussed the prognosis and plan of care.  Plan check blood cultures, urine analysis, chest x-ray.  If temperature response may consider postponing the PEG tube tube tomorrow otherwise will have to wait till Monday.  Discussed with Dr. Janee Morn, patient's husband and answered questions using interpreter hopefully transfer to skilled nursing facility after PEG early next week.  Switch to Eliquis after she has a PEG tube.  Greater than 50% time during this 25-minute visit was spent on counseling and coordination of care and discussion with husband, Child psychotherapist and answering questions.  Delia Heady, MD    To contact Stroke Continuity provider, please refer to [email or web address]. After hours, contact General Neurology

## 2020-08-14 ENCOUNTER — Inpatient Hospital Stay (HOSPITAL_COMMUNITY): Payer: Medicare Other

## 2020-08-14 ENCOUNTER — Encounter (HOSPITAL_COMMUNITY)
Admission: EM | Disposition: A | Discharge status: Skilled Nursing Facility | Payer: Self-pay | Source: Home / Self Care | Attending: Neurology

## 2020-08-14 DIAGNOSIS — E119 Type 2 diabetes mellitus without complications: Secondary | ICD-10-CM

## 2020-08-14 DIAGNOSIS — D62 Acute posthemorrhagic anemia: Secondary | ICD-10-CM

## 2020-08-14 LAB — CBC
HCT: 25 % — ABNORMAL LOW (ref 36.0–46.0)
Hemoglobin: 7.7 g/dL — ABNORMAL LOW (ref 12.0–15.0)
MCH: 23.3 pg — ABNORMAL LOW (ref 26.0–34.0)
MCHC: 30.8 g/dL (ref 30.0–36.0)
MCV: 75.5 fL — ABNORMAL LOW (ref 80.0–100.0)
Platelets: 298 10*3/uL (ref 150–400)
RBC: 3.31 MIL/uL — ABNORMAL LOW (ref 3.87–5.11)
RDW: 19.9 % — ABNORMAL HIGH (ref 11.5–15.5)
WBC: 35.5 10*3/uL — ABNORMAL HIGH (ref 4.0–10.5)
nRBC: 0 % (ref 0.0–0.2)

## 2020-08-14 LAB — URINE CULTURE: Culture: NO GROWTH

## 2020-08-14 LAB — TYPE AND SCREEN
ABO/RH(D): A POS
Antibody Screen: NEGATIVE
Unit division: 0

## 2020-08-14 LAB — BASIC METABOLIC PANEL
Anion gap: 11 (ref 5–15)
BUN: 31 mg/dL — ABNORMAL HIGH (ref 8–23)
CO2: 19 mmol/L — ABNORMAL LOW (ref 22–32)
Calcium: 8.2 mg/dL — ABNORMAL LOW (ref 8.9–10.3)
Chloride: 101 mmol/L (ref 98–111)
Creatinine, Ser: 1.31 mg/dL — ABNORMAL HIGH (ref 0.44–1.00)
GFR calc Af Amer: 47 mL/min — ABNORMAL LOW (ref >60)
GFR calc non Af Amer: 41 mL/min — ABNORMAL LOW (ref >60)
Glucose, Bld: 174 mg/dL — ABNORMAL HIGH (ref 70–99)
Potassium: 4.4 mmol/L (ref 3.5–5.1)
Sodium: 131 mmol/L — ABNORMAL LOW (ref 135–145)

## 2020-08-14 LAB — BPAM RBC
Blood Product Expiration Date: 202108242359
ISSUE DATE / TIME: 202108191850
Unit Type and Rh: 6200

## 2020-08-14 LAB — C DIFFICILE (CDIFF) QUICK SCRN (NO PCR REFLEX)
C Diff antigen: POSITIVE — AB
C Diff interpretation: DETECTED
C Diff toxin: POSITIVE — AB

## 2020-08-14 LAB — GLUCOSE, CAPILLARY
Glucose-Capillary: 69 mg/dL — ABNORMAL LOW (ref 70–99)
Glucose-Capillary: 188 mg/dL — ABNORMAL HIGH (ref 70–99)
Glucose-Capillary: 102 mg/dL — ABNORMAL HIGH (ref 70–99)
Glucose-Capillary: 155 mg/dL — ABNORMAL HIGH (ref 70–99)
Glucose-Capillary: 126 mg/dL — ABNORMAL HIGH (ref 70–99)
Glucose-Capillary: 165 mg/dL — ABNORMAL HIGH (ref 70–99)
Glucose-Capillary: 177 mg/dL — ABNORMAL HIGH (ref 70–99)

## 2020-08-14 LAB — PROCALCITONIN: Procalcitonin: 1.37 ng/mL

## 2020-08-14 SURGERY — ESOPHAGOGASTRODUODENOSCOPY (EGD) WITH PROPOFOL
Anesthesia: Monitor Anesthesia Care

## 2020-08-14 MED ORDER — IOHEXOL 9 MG/ML PO SOLN
500.0000 mL | ORAL | Status: AC
  Administered 2020-08-14 (×2): 500 mL via ORAL

## 2020-08-14 MED ORDER — METRONIDAZOLE IN NACL 5-0.79 MG/ML-% IV SOLN
500.0000 mg | Freq: Three times a day (TID) | INTRAVENOUS | Status: DC
  Administered 2020-08-14 – 2020-08-21 (×22): 500 mg via INTRAVENOUS
  Filled 2020-08-14 (×22): qty 100

## 2020-08-14 MED ORDER — DEXTROSE 50 % IV SOLN
INTRAVENOUS | Status: AC
  Administered 2020-08-14: 50 mL
  Filled 2020-08-14: qty 50

## 2020-08-14 MED ORDER — IOHEXOL 300 MG/ML  SOLN
100.0000 mL | Freq: Once | INTRAMUSCULAR | Status: AC | PRN
  Administered 2020-08-14: 100 mL via INTRAVENOUS

## 2020-08-14 MED ORDER — VANCOMYCIN 50 MG/ML ORAL SOLUTION
500.0000 mg | Freq: Four times a day (QID) | ORAL | Status: DC
  Administered 2020-08-14 (×2): 500 mg via ORAL
  Filled 2020-08-14 (×5): qty 10

## 2020-08-14 MED ORDER — VANCOMYCIN 50 MG/ML ORAL SOLUTION
500.0000 mg | Freq: Four times a day (QID) | ORAL | Status: DC
  Administered 2020-08-14 – 2020-08-20 (×21): 500 mg
  Filled 2020-08-14 (×27): qty 10

## 2020-08-14 NOTE — Progress Notes (Signed)
SLP Cancellation Note  Patient Details Name: Samantha Paul MRN: [id number] DOB: 02/24/1946   Cancelled treatment:       Reason Eval/Treat Not Completed: Patient at procedure or test/unavailable   Christina Gintz, Riley Nearing 08/14/2020, 1:47 PM

## 2020-08-14 NOTE — Progress Notes (Signed)
Occupational Therapy Treatment Patient Details Name: Samantha Paul MRN: [id number] DOB: 13-Jan-1946 Today's Date: 08/14/2020    History of present illness 74 y.o. female with history of atrial fibrillation, HTN, DM and stroke, who presents with acute onset of right gaze deviation, left-sided hemiplegia and ?dense receptive and expressive aphasia. 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 Pt received tPA and underwent R ICA and MCA thrombectomy on 8/1. Follow-up MRI demonstrating moderate R MCA as well as R ACA and PCA infarcts with trace petechial hemorrhage.   OT comments  Patient continues to make steady progress towards goals in skilled OT session. Patient's session encompassed co-treat with PT in order to address functional deficits with regard to cognition, bed mobility, and transfers. Therapist's utilized in house interpreter in session with increased success as pt was able to engage in movements on R and nod yes and no with a ten second delay with 80% accuracy. Pt remains a max of 2 for bed mobility and ADLs, however noted increased ability for sitting balance (mod A of one) and able to hold head upright with cues from therapist. Discharge continues to remain appropriate, will continue to follow acutely.    Follow Up Recommendations  SNF;LTACH    Equipment Recommendations  None recommended by OT    Recommendations for Other Services      Precautions / Restrictions Precautions Precautions: Fall Precaution Comments: dense Lt hemiplegic with Rt gaze preference Restrictions Weight Bearing Restrictions: No       Mobility Bed Mobility Overal bed mobility: Needs Assistance Bed Mobility: Supine to Sit;Sit to Supine;Rolling Rolling: Max assist;+2 for physical assistance;+2 for safety/equipment   Supine to sit: Max assist;+2 for physical assistance;+2 for safety/equipment Sit to supine: Max assist;+2 for physical  assistance;+2 for safety/equipment   General bed mobility comments: Pt continues to progress with pursposeful movement on R (with ten second delay) with increased sitting balance noted requiring mod A of 1  Transfers                 General transfer comment: unable to safely attempt     Balance Overall balance assessment: Needs assistance Sitting-balance support: Feet unsupported;Bilateral upper extremity supported Sitting balance-Leahy Scale: Poor Sitting balance - Comments: mod A for sitting balance, increased participation noted on R, posterior lean as pt fatigued Postural control: Posterior lean                                 ADL either performed or assessed with clinical judgement   ADL Overall ADL's : Needs assistance/impaired     Grooming: Maximal assistance;Bed level Grooming Details (indicate cue type and reason): increased ability to follow commands with R                             Functional mobility during ADLs: Maximal assistance;+2 for safety/equipment;+2 for physical assistance;Total assistance General ADL Comments: pt making progress, however remains a dense hemi and now febrile with c-diff     Vision   Vision Assessment?: Yes Additional Comments: Pt able to localize focus for a brief moment with cues, however unable to assess if visually tracking horizontally as gaze bounces bilaterally   Perception Perception Comments: remains difficult to assess even with increased following of commands and use of interpreter   Praxis  Cognition Arousal/Alertness: Lethargic Behavior During Therapy: Flat affect Overall Cognitive Status: Difficult to assess Area of Impairment: Attention;Following commands                   Current Attention Level: Focused   Following Commands: Follows one step commands with increased time       General Comments: With use of interpreter, pt now following commands with about 10 second  delay        Exercises     Shoulder Instructions       General Comments      Pertinent Vitals/ Pain       Pain Assessment: Faces Faces Pain Scale: Hurts little more Pain Location: Grimacing with ROM of RLE Pain Descriptors / Indicators: Grimacing Pain Intervention(s): Limited activity within patient's tolerance;Monitored during session;Repositioned  Home Living                                          Prior Functioning/Environment              Frequency  Min 2X/week        Progress Toward Goals  OT Goals(current goals can now be found in the care plan section)  Progress towards OT goals: Progressing toward goals  Acute Rehab OT Goals Patient Stated Goal: pt unable OT Goal Formulation: With family Time For Goal Achievement: 08/14/20 Potential to Achieve Goals: Good  Plan Discharge plan remains appropriate    Co-evaluation    PT/OT/SLP Co-Evaluation/Treatment: Yes Reason for Co-Treatment: Complexity of the patient's impairments (multi-system involvement);Necessary to address cognition/behavior during functional activity;To address functional/ADL transfers;For patient/therapist safety PT goals addressed during session: Mobility/safety with mobility;Balance;Strengthening/ROM OT goals addressed during session: ADL's and self-care;Strengthening/ROM      AM-PAC OT "6 Clicks" Daily Activity     Outcome Measure     Help from another person taking care of personal grooming?: A Lot Help from another person toileting, which includes using toliet, bedpan, or urinal?: A Lot Help from another person bathing (including washing, rinsing, drying)?: A Lot Help from another person to put on and taking off regular upper body clothing?: A Lot Help from another person to put on and taking off regular lower body clothing?: A Lot 6 Click Score: 10    End of Session Equipment Utilized During Treatment: Oxygen  OT Visit Diagnosis: Unsteadiness on feet  (R26.81);Cognitive communication deficit (R41.841);Hemiplegia and hemiparesis Symptoms and signs involving cognitive functions: Cerebral infarction Hemiplegia - Right/Left: Left Hemiplegia - dominant/non-dominant: Non-Dominant Hemiplegia - caused by: Cerebral infarction   Activity Tolerance Patient limited by fatigue;Patient tolerated treatment well   Patient Left in bed;with call bell/phone within reach;with family/visitor present;with bed alarm set   Nurse Communication Mobility status        Time: 8563-1497 OT Time Calculation (min): 35 min  Charges: OT General Charges $OT Visit: 1 Visit OT Treatments $Self Care/Home Management : 8-22 mins  Pollyann Glen E. Sharell Hilmer, COTA/L Acute Rehabilitation Services [phone]   Cherlyn Cushing 08/14/2020, 4:50 PM

## 2020-08-14 NOTE — Progress Notes (Signed)
PROGRESS NOTE  Samantha Paul FFM:[id number] DOB: 1946-02-21 DOA: 07/26/2020 PCP: Rometta Emery, MD   LOS: 19 days   Brief Narrative / Interim history: 74 year old female with HTN, HLD, DM2, A. fib, admitted with a stroke on 07/26/2020 with symptoms of right gaze deviation, left-sided hemiplegia and aphasia.  CT of the head and neck on admission showed embolic occlusion of the right carotid terminus, and received TPA and underwent interventional revascularization.  She was on neurology service up until 8/19 when she became septic and we were consulted.  She failed speech evaluation on several occasions, remains aphasic, and was scheduled to have a PEG tube placement on 8/20.  She was initially intubated and has remained on 3 L after extubation.  She was treated for Klebsiella pneumonia pneumonia with Zosyn followed by ceftriaxone between 8/7 and 8/15.  On 8/19 developed fever of 100.5, tachycardic, tachypneic.  Chest x-ray was without acute findings.  Urinalysis showed RBCs without leukocytes or nitrates.  She was found to have significant diarrhea, and C. difficile was checked and returned positive.  Subjective / 24h Interval events: Has her eyes open this morning but she is aphasic, nonverbal, does not follow commands for me.  Assessment & Plan: Principal Problem Sepsis likely due to C. difficile colitis -patient was septic on 8/19 with elevated WBC to 50 K, febrile, tachycardic and on source.  She was started on broad-spectrum antibiotics as well and cultures were sent.  I would keep on broad-spectrum antibiotics and if cultures remain negative at 48 hours with medical treatment for C. difficile colitis only.   Active Problems Anemia/concern for acute blood loss-fecal occult was positive, however she has been having profuse diarrhea.  She has received a unit of packed red blood cells.  Would favor treating the C. difficile and close monitoring.  If bleeding/anemia persists with resolution of her  diarrhea will consult gastroenterology.  Type 2 diabetes mellitus-continue Lantus, sliding scale, she had an episode of hypoglycemia probably in the setting of sepsis.  Resume tube feeds.  CBG (last 3)  Recent Labs    08/14/20 0348 08/14/20 0437 08/14/20 0837  GLUCAP 69* 188* 102*   A. Fib -Continue diltiazem and metoprolol -On Eliquis prior to admission-hold for now due to petechial hemorrhages noted on CT.    Hypertension -Continue metoprolol  Right MCA & ACA stroke -S/p TPA and revascularization by IR: Continue aspirin, statin  Acute respiratory failure with hypoxia-Patient intubated for IR procedure and extubated on 8/14.  Currently requiring 3 L of oxygen via nasal cannula.  Dysphagia -On cortrak tube feedings.  Initial plans for PEG tube were on 8/20 but given sepsis this has been postponed likely next week.  Hyperlipidemia -Continue statin  Thank you for this consultation.  Our [place] hospitalist team will follow the patient with you.  Scheduled Meds: .  stroke: mapping our early stages of recovery book   Does not apply Once  . aspirin  81 mg Per Tube Daily  . bethanechol  10 mg Per Tube TID  . chlorhexidine  15 mL Mouth Rinse BID  . diltiazem  90 mg Per Tube Q6H  . feeding supplement (PROSource TF)  45 mL Per Tube Daily  . insulin aspart  0-15 Units Subcutaneous Q4H  . insulin aspart  5 Units Subcutaneous Q4H  . insulin glargine  22 Units Subcutaneous Daily  . mouth rinse  15 mL Mouth Rinse q12n4p  . metoprolol tartrate  50 mg Per Tube BID  . rosuvastatin  20 mg Per Tube Daily  . vancomycin  500 mg Oral Q6H   Continuous Infusions: . sodium chloride 10 mL/hr at 08/09/20 0600  . sodium chloride 75 mL/hr at 08/14/20 0630  . ceFEPime (MAXIPIME) IV Stopped (08/14/20 0513)  . feeding supplement (JEVITY 1.2 CAL) 1,000 mL (08/13/20 2216)  . metronidazole    . [START ON 08/15/2020] vancomycin     PRN Meds:.sodium chloride, acetaminophen **OR** acetaminophen (TYLENOL)  oral liquid 160 mg/5 mL **OR** acetaminophen, senna-docusate    DVT prophylaxis: Place and maintain sequential compression device Start: 07/26/20 1636 SCD's Start: 07/26/20 1443     Code Status: Prior  Family Communication: no family at bedside   Status is: Inpatient  Remains inpatient appropriate because:Inpatient level of care appropriate due to severity of illness   Dispo: The patient is from: Home              Anticipated d/c is to: SNF              Anticipated d/c date is: > 3 days              Patient currently is not medically stable to d/c.  Microbiology  C diff 8/19 - positive Blood cultures 8/19 - pending  Antimicrobials: Vancomycin/cefepime/Flagyl 8/19 >> P.o. vancomycin 8/20 >>   Objective: Vitals:   08/13/20 2116 08/13/20 2347 08/14/20 0350 08/14/20 0831  BP: (!) 143/79 127/64 135/83 137/84  Pulse: 99 67 100 (!) 106  Resp: 18 18 20 16   Temp: 99.2 F (37.3 C) 97.7 F (36.5 C) 99.4 F (37.4 C) 97.6 F (36.4 C)  TempSrc: Oral Oral Oral Oral  SpO2: 100% 100% 100% 100%  Weight:      Height:        Intake/Output Summary (Last 24 hours) at 08/14/2020 0905 Last data filed at 08/14/2020 0630 Gross per 24 hour  Intake 6945.63 ml  Output 3700 ml  Net 3245.63 ml   Filed Weights   08/10/20 0500 08/11/20 0500 08/12/20 0500  Weight: 68.1 kg 67.2 kg 68.2 kg    Examination:  Constitutional: alert, non verbal Eyes: no scleral icterus ENMT: Mucous membranes are moist.  Neck: normal, supple Respiratory: clear to auscultation bilaterally, no wheezing, no crackles. Cardiovascular: Regular rate and rhythm, no murmurs / rubs / gallops. No LE edema. Abdomen: non distended, BS+, does not appear tender Musculoskeletal: no clubbing / cyanosis.  Skin: no rashes Neurologic: Does not participate in neuro exam   Data Reviewed: I have independently reviewed following labs and imaging studies   CBC: Recent Labs  Lab 08/10/20 0239 08/10/20 0239 08/11/20 0551  08/12/20 0151 08/13/20 1352 08/13/20 1634 08/13/20 2200  WBC 22.3*  --  21.3* 20.1* 49.9* 49.9*  --   NEUTROABS  --   --   --  16.0*  --  44.4*  --   HGB 7.5*   < > 7.7* 7.7* 6.6* 6.8* 8.1*  HCT 25.6*   < > 26.3* 26.3* 22.6* 23.5* 26.7*  MCV 69.9*  --  72.1* 72.9* 73.4* 73.9*  --   PLT 491*  --  456* 471* 359 399  --    < > = values in this interval not displayed.   Basic Metabolic Panel: Recent Labs  Lab 08/09/20 0327 08/10/20 0239 08/11/20 0551 08/12/20 0151 08/13/20 1352  NA 133* 128* 132* 131* 132*  K 4.7 4.9 5.3* 4.9 4.6  CL 98 95* 97* 98 101  CO2 24 24 25 25  21*  GLUCOSE 101*  199* 139* 161* 120*  BUN 30* 30* 31* 30* 35*  CREATININE 1.26* 1.25* 1.17* 1.17* 1.37*  CALCIUM 8.6* 8.2* 8.4* 8.3* 8.2*   Liver Function Tests: No results for input(s): AST, ALT, ALKPHOS, BILITOT, PROT, ALBUMIN in the last 168 hours. Coagulation Profile: Recent Labs  Lab 08/13/20 1634  INR 1.2   HbA1C: No results for input(s): HGBA1C in the last 72 hours. CBG: Recent Labs  Lab 08/13/20 2121 08/13/20 2344 08/14/20 0348 08/14/20 0437 08/14/20 0837  GLUCAP 132* 133* 69* 188* 102*    Recent Results (from the past 240 hour(s))  Urine Culture     Status: None   Collection Time: 08/13/20 10:43 AM   Specimen: Urine, Random  Result Value Ref Range Status   Specimen Description URINE, RANDOM  Final   Special Requests NONE  Final   Culture   Final    NO GROWTH Performed at [facility], [address] [place], [place] [address]    Report Status 08/14/2020 FINAL  Final  Culture, blood (routine x 2)     Status: None (Preliminary result)   Collection Time: 08/13/20  1:52 PM   Specimen: BLOOD RIGHT HAND  Result Value Ref Range Status   Specimen Description BLOOD RIGHT HAND  Final   Special Requests   Final    BOTTLES DRAWN AEROBIC ONLY Blood Culture adequate volume   Culture   Final    NO GROWTH < 24 HOURS Performed at [facility], [address] [place], [place]  [address]    Report Status PENDING  Incomplete  Culture, blood (routine x 2)     Status: None (Preliminary result)   Collection Time: 08/13/20  1:58 PM   Specimen: BLOOD RIGHT HAND  Result Value Ref Range Status   Specimen Description BLOOD RIGHT HAND  Final   Special Requests   Final    BOTTLES DRAWN AEROBIC ONLY Blood Culture adequate volume   Culture   Final    NO GROWTH < 24 HOURS Performed at [facility], [address] [place], [place] [address]    Report Status PENDING  Incomplete  C Difficile Quick Screen (NO PCR Reflex)     Status: Abnormal   Collection Time: 08/14/20  1:48 AM   Specimen: STOOL  Result Value Ref Range Status   C Diff antigen POSITIVE (A) NEGATIVE Final   C Diff toxin POSITIVE (A) NEGATIVE Final   C Diff interpretation Toxin producing C. difficile detected.  Final    Comment: CRITICAL RESULT CALLED TO, READ BACK BY AND VERIFIED WITH: Lutricia HorsfallJ STEELMAN RN 08/14/20 0501 JDW Performed at [facility], [address] [place], [place] [address]      Radiology Studies: DG CHEST PORT 1 VIEW  Result Date: 08/13/2020 CLINICAL DATA:  Febrile with leukocytosis. Dysphagia due to recent cerebrovascular accident. EXAM: PORTABLE CHEST 1 VIEW COMPARISON:  08/05/2020 FINDINGS: Feeding tube tip projects at the gastric cardia, similar to prior. Cardiomediastinal silhouette is similar to prior. Similar mild elevation the right hemidiaphragm. No consolidation. Linear bibasilar opacities, favored to represent atelectasis. No sizable pleural effusion or discernible pneumothorax. Biapical pleuroparenchymal scarring. The visualized skeletal structures are unremarkable. Apparent widening of the left glenohumeral joint. IMPRESSION: 1. No evidence of consolidation, although portable technique limits evaluation of the lung bases. Dedicated PA and lateral radiographs could better evaluate if the patient is able. 2. Apparent widening of the left glenohumeral joint. Consider dedicated left  shoulder radiographs to further  evaluate. Electronically Signed   By: Feliberto Harts MD   On: 08/13/2020 11:54    Pamella Pert, MD, PhD [facility]  Between 7 am - 7 pm I am available, please contact me via Amion or Securechat  Between 7 pm - 7 am I am not available, please contact night coverage MD/APP via Amion

## 2020-08-14 NOTE — Progress Notes (Signed)
STROKE TEAM PROGRESS NOTE   INTERVAL HISTORY Patient was found to have elevated white count of 49,000 yesterday believed to be due to sepsis from C. difficile colitis and started on vancomycin and cefepime and medical hospitalist consult was obtained.  PEG tube has been postponed to next week till she improves.  She is afebrile this morning and neurological exam appears unchanged.  She received 1 unit of blood transfusion yesterday and hemoglobin is now up to 8.2 and white count has come down to 35.5 Vitals:   08/13/20 2116 08/13/20 2347 08/14/20 0350 08/14/20 0831  BP: (!) 143/79 127/64 135/83 137/84  Pulse: 99 67 100 (!) 106  Resp: 18 18 20 16   Temp: 99.2 F (37.3 C) 97.7 F (36.5 C) 99.4 F (37.4 C) 97.6 F (36.4 C)  TempSrc: Oral Oral Oral Oral  SpO2: 100% 100% 100% 100%  Weight:      Height:       CBC:  Recent Labs  Lab 08/12/20 0151 08/12/20 0151 08/13/20 1352 08/13/20 1352 08/13/20 1634 08/13/20 2200  WBC 20.1*   < > 49.9*  --  49.9*  --   NEUTROABS 16.0*  --   --   --  44.4*  --   HGB 7.7*   < > 6.6*   < > 6.8* 8.1*  HCT 26.3*   < > 22.6*   < > 23.5* 26.7*  MCV 72.9*   < > 73.4*  --  73.9*  --   PLT 471*   < > 359  --  399  --    < > = values in this interval not displayed.   Basic Metabolic Panel:  Recent Labs  Lab 08/12/20 0151 08/13/20 1352  NA 131* 132*  K 4.9 4.6  CL 98 101  CO2 25 21*  GLUCOSE 161* 120*  BUN 30* 35*  CREATININE 1.17* 1.37*  CALCIUM 8.3* 8.2*   IMAGING past 24 hours No results found.  PHYSICAL EXAM    General - Well nourished, well developed elderly Asian lady, not in distress  Ophthalmologic - fundi not visualized due to noncooperation.  Cardiovascular - irregularly irregular heart rate and rhythm.  Neuro -   eyes are open with voice and maintained opening.  Able to follow limited midline and peripheral commands on the right,  .  Eyes in right gaze position, barely cross midline. Not blinking to visual threat on the left but  blinking on the right, doll's eyes present but not cross midline, PERRL. Corneal reflex present, gag and cough present. Breathing over the vent.  Left lower facial weakness.  Tongue protrusion not cooperative. On pain stimulation, active movement of RUE and RLE with RUE at least 4/5 and RLE at least 3-/5. LUE 0/5 and LLE 2-/5 withdraw on pain. DTR 1+ and bilateral positive babinski. Sensation not cooperative but brisk response to painful stimulation, coordination not cooperative and gait not tested.   ASSESSMENT/PLAN [name] is a 74 y.o. female with history of atrial fibrillation, HTN, DM and stroke presenting with right gaze deviation, left-sided hemiplegia and dense receptive and expressive aphasia. She received tPA 07/26/2020 at 1259. Taken to IR for R ICA T occlusion.  Stroke:   R MCA and ACA infarct due to R tICA occlusion s/p tPA and IR w/ TICI2c-3, embolic secondary to known AF but not on AC for at least a week  Code Stroke CT head No acute abnormality.     CTA head & neck R tICA occlusion  w/ min MCA flow. Some narrowing B VA and BA.   CT perfusion R frontal core 16 cc with penumbra 136 R MCA territory  Cerebral angio RT ICA T occlusion ->TICI 2C revascularization of RT MCA and a TICI 3 revascularization of RT ACA .  CT head post IR - new R frontal lobe hyper and hypointensity (infarct, edema, petechial hemorrhage)  MRI right MCA and ACA multifocal large infarcts with petechial hemo  CT repeat stable right MCA multifocal infarcts, minimal MLS.  2D Echo 07/03/2020 EF 65-70%. No source of embolus   LDL 90  HgbA1c 12.7  VTE prophylaxis - SCDs   Eliquis (apixaban) daily prior to admission, now on aspirin 81 mg. Hold AC for now given petechial hemorrhage, large infarct, GIB/cdiff requiring transfusion and planned PEG procedure.  Therapy recommendations: SNF  Disposition:  Pending - full code   Palliative Care, involved for followup next week  Sepsis likely due to C. difficile  colitis w/ diarrhea  Code sepsis 8/19   WBC 20.2->22.3->21.3->20.1->49.9  TM 102.5  Tachycardia 100s  Lactic acid 1.5  Cdiff Positive   Blood Cx no growth < 24h  UA > 50 RBC, 30 Protein. No WBC or bacteria  Urine Cx pending   CT chest, abd, pelvis pending   Rectal tube remains in place  Flagyl 8/19>>  Cefepime 8/19>>  Vancomycin 8/19  Microcytic anemia  Possible acute blood loss in setting of Diarrhea requiring rectal tube  Hgb 8.1->7.5->7.7->6.6 - 6.8 - 1u RBC - 8.1 - pending   APTT 38  FOB positive  Fe 15, Sat 5, Fer 634  Rectal tube remains in place  Acute Respiratory Failure, resolved  Secondary to stroke  Intubated for IR, left intubated post IR   Extubated 8/14   On 3L [place]  Stable on the floor   Atrial Fibrillation w/ RVR  Home anticoagulation:  Eliquis (apixaban) daily - not on for at least a week prior to admission   On diltiazem 240, metoprolol 12.5 bid  PTA  On diltiazem 90mg  Q6  On metoprolol 50 bid  No AC for now given petechial hemorrhage, large infarct, GIB/cdiff requiring transfusion and planned PEG procedure. Continue aspirin for now and start Eliquis after PEG tube or when able to swallow  Hypotension  Home meds:  None, no hx HTN . BP stable now . On metoprolol  50 bid . Long-term BP goal normotensive  Hyperlipidemia  Home meds:  crestor 20, resumed in hospital  LDL 90, at goal < 70  Continue statin at discharge  Diabetes type II Uncontrolled  Home meds:  lantus 20 daily, metformin 500 bid  HgbA1c 12.7, goal < 7.0  CBGs  SSI  DM coordinator assistance appreciated  On lantus 22, novolog 5U Q4h  Hypoglycemia during night 8/20 d/t TF on hold for possible PEG - treated w/ D 50  Dysphagia . Secondary to stroke . NPO . Has cortrak . On TF @50  . Trauma consulted for PEG, on hold until next week d/t sepsis . Speech on board   Klebsiella UTI, treated  CXR unremarkable  UA WBC 21-50  U Cx  insignificant growth  B Cx NGTD  Respiratory culture - klebsiella pneumo (pan sensitive)  Zosyn 8/7>>8/9  Rocephin 8/9>>8/15  AKI  creatinine 1.37  On TF @ 50 + Pro Source 45 ml daily  Off IVF  Hyponatremia, improving  Na 132  Partially due to hyperglycemia  Close monitoring  Hx of stroke  06/2018 left pontine infarct  likely secondary to small vessel disease source. CTA head and neck neg. EF 65-70%. LDL 140 and A1C 12.3. DAPT x 3 weeks and lipitor 40.  Other Stroke Risk Factors  Advanced age  Other Active Problems  Urinary retention. I&O cath in setting of sepsis, 1800 out following spontaneous voiding. Bladder scan for ongoing urinary retention   Hospital day # 19 Appreciate help from medical hospitalist team.  Continue vancomycin and cefepime for C. difficile colitis.  Continue tube feeds and postpone PEG tube till next week after medical stability.  Long discussion with patient and husband at the bedside and answered questions.  Discussed with Dr. Stevphen Meuse.  Greater than 50% time during this 35-minute visit was spent in counseling and coordination of care about her stroke, C. difficile colitis and sepsis and answering questions  Delia Heady, MD To contact Stroke Continuity provider, please refer to [email or web address]. After hours, contact General Neurology

## 2020-08-14 NOTE — Progress Notes (Signed)
Physical Therapy Treatment Patient Details Name: Samantha Paul MRN: [id number] DOB: 1946/07/08 Today's Date: 08/14/2020    History of Present Illness 74 y.o. female with history of atrial fibrillation, HTN, DM and stroke, who presents with acute onset of right gaze deviation, left-sided hemiplegia and ?dense receptive and expressive aphasia. 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 Pt received tPA and underwent R ICA and MCA thrombectomy on 8/1. Follow-up MRI demonstrating moderate R MCA as well as R ACA and PCA infarcts with trace petechial hemorrhage.    PT Comments    Pt was lethargic for all of the session with little sign of overt focus or attention to the session.  With interpreter's assist pt showed ability to follow a 1 step verbal command without cues,but with about a 10 second delay.  Emphasis on transition, truncal activation at EOB and sitting balance as well as ability to follow 1 step commands in her primary language.   Follow Up Recommendations  SNF     Equipment Recommendations  Wheelchair (measurements PT);Wheelchair cushion (measurements PT);Hospital bed    Recommendations for Other Services       Precautions / Restrictions Precautions Precautions: Fall Precaution Comments: dense Lt hemiplegic with Rt gaze preference Restrictions Weight Bearing Restrictions: No    Mobility  Bed Mobility Overal bed mobility: Needs Assistance Bed Mobility: Supine to Sit;Sit to Supine;Rolling Rolling: Max assist;+2 for physical assistance;+2 for safety/equipment   Supine to sit: Max assist;+2 for physical assistance;+2 for safety/equipment Sit to supine: Max assist;+2 for physical assistance;+2 for safety/equipment   General bed mobility comments: Pt continues to progress with pursposeful movement on R (with ten second delay) with increased sitting balance noted requiring mod A of 1  Transfers                  General transfer comment: unable to safely attempt   Ambulation/Gait                 Stairs             Wheelchair Mobility    Modified Rankin (Stroke Patients Only)       Balance Overall balance assessment: Needs assistance Sitting-balance support: Feet unsupported;Bilateral upper extremity supported Sitting balance-Leahy Scale: Poor Sitting balance - Comments: mod A for sitting balance, increased participation noted on R, posterior lean as pt fatigued.  Within the trial of balance, pt did show truncal activation, but was generally uncoordinatee Postural control: Posterior lean                                  Cognition Arousal/Alertness: Lethargic Behavior During Therapy: Flat affect Overall Cognitive Status: Difficult to assess Area of Impairment: Attention;Following commands                   Current Attention Level: Focused   Following Commands: Follows one step commands with increased time       General Comments: With use of interpreter, pt now following commands with about 10 second delay      Exercises Other Exercises Other Exercises: bil LE P to A ROM as well as P to AA rom of UE's    General Comments General comments (skin integrity, edema, etc.): vss      Pertinent Vitals/Pain Pain Assessment: Faces Faces Pain Scale: Hurts little more Pain Location: Grimacing with ROM of  RLE Pain Descriptors / Indicators: Grimacing Pain Intervention(s): Limited activity within patient's tolerance;Monitored during session    Home Living                      Prior Function            PT Goals (current goals can now be found in the care plan section) Acute Rehab PT Goals Patient Stated Goal: pt unable PT Goal Formulation: Patient unable to participate in goal setting Time For Goal Achievement: 08/25/20 Potential to Achieve Goals: Fair Progress towards PT goals: Progressing toward goals    Frequency    Min  2X/week      PT Plan Current plan remains appropriate    Co-evaluation PT/OT/SLP Co-Evaluation/Treatment: Yes Reason for Co-Treatment: Complexity of the patient's impairments (multi-system involvement) PT goals addressed during session: Mobility/safety with mobility OT goals addressed during session: ADL's and self-care      AM-PAC PT "6 Clicks" Mobility   Outcome Measure  Help needed turning from your back to your side while in a flat bed without using bedrails?: Total Help needed moving from lying on your back to sitting on the side of a flat bed without using bedrails?: Total Help needed moving to and from a bed to a chair (including a wheelchair)?: Total Help needed standing up from a chair using your arms (e.g., wheelchair or bedside chair)?: Total Help needed to walk in hospital room?: Total Help needed climbing 3-5 steps with a railing? : Total 6 Click Score: 6    End of Session Equipment Utilized During Treatment: Oxygen Activity Tolerance: Patient limited by fatigue Patient left: in bed;with call bell/phone within reach;with bed alarm set;with family/visitor present Nurse Communication: Mobility status PT Visit Diagnosis: Hemiplegia and hemiparesis;Difficulty in walking, not elsewhere classified (R26.2) Hemiplegia - Right/Left: Left Hemiplegia - dominant/non-dominant: Non-dominant Hemiplegia - caused by: Cerebral infarction     Time: 9417-4081 PT Time Calculation (min) (ACUTE ONLY): 35 min  Charges:  $Neuromuscular Re-education: 8-22 mins                     08/14/2020  Jacinto Halim., PT Acute Rehabilitation Services [phone]  (pager) [phone]  (office)   Eliseo Gum Lavetta Geier 08/14/2020, 5:44 PM

## 2020-08-14 NOTE — Progress Notes (Signed)
Hypoglycemic Event  CBG: 69  Treatment: 50mg  of dextrose  Symptoms: None  Follow-up CBG: Time: 0435 CBG Result:188  Possible Reasons for Event:  Tube feed was held since mid-night for peg tube placement  Comments/MD notified:MD S. Essien-Akpan

## 2020-08-14 NOTE — TOC Progression Note (Signed)
Transition of Care [facility]) - Progression Note    Patient Details  Name: Samantha Paul MRN: [id number] Date of Birth: May 15, 1946  Transition of Care [facility]) CM/SW Contact  Kermit Balo, RN Phone Number: 08/14/2020, 11:40 AM  Clinical Narrative:    Using the interpreter: CM provided the patients spouse the bed offers for SNF. Pts spouse wants to use the weekend to go and see the facilities. CM will meet with him again on Monday for his choice.  TOC following.   Expected Discharge Plan: Skilled Nursing Facility Barriers to Discharge: Continued Medical Work up  Expected Discharge Plan and Services Expected Discharge Plan: Skilled Nursing Facility In-house Referral: Clinical Social Work Discharge Planning Services: CM Consult Post Acute Care Choice: Skilled Nursing Facility Living arrangements for the past 2 months: Single Family Home                                       Social Determinants of Health (SDOH) Interventions    Readmission Risk Interventions Readmission Risk Prevention Plan 07/03/2020 07/03/2020  Post Dischage Appt Complete -  Medication Screening Complete Complete  Transportation Screening Complete Complete

## 2020-08-14 NOTE — Progress Notes (Signed)
Patient ID: Samantha Paul, female   DOB: [birth year], 74 y.o.   MRN: [id number] Code sepsis called yesterday. WBC 49.9. It looks like she has C diff colitis. PEG is cancelled for today. We will follow along to reconsider PEG when she improves.  [name], MD, MPH, FACS Please use [email or web address] to contact on call provider

## 2020-08-15 DIAGNOSIS — I69391 Dysphagia following cerebral infarction: Secondary | ICD-10-CM

## 2020-08-15 LAB — COMPREHENSIVE METABOLIC PANEL
ALT: 43 U/L (ref 0–44)
AST: 28 U/L (ref 15–41)
Albumin: 1.7 g/dL — ABNORMAL LOW (ref 3.5–5.0)
Alkaline Phosphatase: 159 U/L — ABNORMAL HIGH (ref 38–126)
Anion gap: 8 (ref 5–15)
BUN: 31 mg/dL — ABNORMAL HIGH (ref 8–23)
CO2: 21 mmol/L — ABNORMAL LOW (ref 22–32)
Calcium: 8.1 mg/dL — ABNORMAL LOW (ref 8.9–10.3)
Chloride: 102 mmol/L (ref 98–111)
Creatinine, Ser: 1.28 mg/dL — ABNORMAL HIGH (ref 0.44–1.00)
GFR calc Af Amer: 48 mL/min — ABNORMAL LOW (ref >60)
GFR calc non Af Amer: 42 mL/min — ABNORMAL LOW (ref >60)
Glucose, Bld: 146 mg/dL — ABNORMAL HIGH (ref 70–99)
Potassium: 4.5 mmol/L (ref 3.5–5.1)
Sodium: 131 mmol/L — ABNORMAL LOW (ref 135–145)
Total Bilirubin: 0.3 mg/dL (ref 0.3–1.2)
Total Protein: 5.5 g/dL — ABNORMAL LOW (ref 6.5–8.1)

## 2020-08-15 LAB — GLUCOSE, CAPILLARY
Glucose-Capillary: 113 mg/dL — ABNORMAL HIGH (ref 70–99)
Glucose-Capillary: 117 mg/dL — ABNORMAL HIGH (ref 70–99)
Glucose-Capillary: 134 mg/dL — ABNORMAL HIGH (ref 70–99)
Glucose-Capillary: 134 mg/dL — ABNORMAL HIGH (ref 70–99)
Glucose-Capillary: 146 mg/dL — ABNORMAL HIGH (ref 70–99)
Glucose-Capillary: 162 mg/dL — ABNORMAL HIGH (ref 70–99)
Glucose-Capillary: 91 mg/dL (ref 70–99)

## 2020-08-15 LAB — PROCALCITONIN: Procalcitonin: 0.87 ng/mL

## 2020-08-15 LAB — CBC
HCT: 24.2 % — ABNORMAL LOW (ref 36.0–46.0)
Hemoglobin: 7.3 g/dL — ABNORMAL LOW (ref 12.0–15.0)
MCH: 22.8 pg — ABNORMAL LOW (ref 26.0–34.0)
MCHC: 30.2 g/dL (ref 30.0–36.0)
MCV: 75.6 fL — ABNORMAL LOW (ref 80.0–100.0)
Platelets: 238 10*3/uL (ref 150–400)
RBC: 3.2 MIL/uL — ABNORMAL LOW (ref 3.87–5.11)
RDW: 20.3 % — ABNORMAL HIGH (ref 11.5–15.5)
WBC: 21.7 10*3/uL — ABNORMAL HIGH (ref 4.0–10.5)
nRBC: 0 % (ref 0.0–0.2)

## 2020-08-15 MED ORDER — SODIUM CHLORIDE 0.9 % IV SOLN
INTRAVENOUS | Status: DC | PRN
  Administered 2020-08-18 – 2020-08-21 (×4): 250 mL via INTRAVENOUS

## 2020-08-15 MED ORDER — FERROUS SULFATE 325 (65 FE) MG PO TABS
325.0000 mg | ORAL_TABLET | Freq: Three times a day (TID) | ORAL | Status: DC
  Administered 2020-08-15 – 2020-08-16 (×4): 325 mg via ORAL
  Filled 2020-08-15 (×6): qty 1

## 2020-08-15 MED ORDER — FERROUS SULFATE 325 (65 FE) MG PO TABS: 325.0000 mg | ORAL_TABLET | Freq: Three times a day (TID) | ORAL | Status: DC

## 2020-08-15 MED ORDER — CHLORHEXIDINE GLUCONATE CLOTH 2 % EX PADS
6.0000 | MEDICATED_PAD | Freq: Every day | CUTANEOUS | Status: DC
  Administered 2020-08-15 – 2020-08-23 (×8): 6 via TOPICAL

## 2020-08-15 NOTE — Progress Notes (Signed)
STROKE TEAM PROGRESS NOTE   INTERVAL HISTORY Patient husband and RN are at bedside. Patient initially eyes closed, however easy open on voice. However still has left neglect, left hemianopia, left hemiplegia. Afebrile, leukocytosis improved, still has diarrhea due to C. difficile, on antibiotics. Still on tube feeding. Hemoglobin continue to trending down but steady.  Vitals:   08/14/20 1946 08/14/20 2335 08/15/20 0311 08/15/20 0811  BP: 124/88 117/75 114/79 127/85  Pulse: 89 93 69 93  Resp: 19 20 17  (!) 23  Temp: 98.3 F (36.8 C) 98.2 F (36.8 C) 98.2 F (36.8 C) 98.5 F (36.9 C)  TempSrc: Oral Oral Oral Axillary  SpO2: 100% 100% 100% 100%  Weight:      Height:       CBC:  Recent Labs  Lab 08/12/20 0151 08/13/20 1352 08/13/20 1634 08/13/20 2200 08/14/20 1321 08/15/20 0302  WBC 20.1*   < > 49.9*  --  35.5* 21.7*  NEUTROABS 16.0*  --  44.4*  --   --   --   HGB 7.7*   < > 6.8*   < > 7.7* 7.3*  HCT 26.3*   < > 23.5*   < > 25.0* 24.2*  MCV 72.9*   < > 73.9*  --  75.5* 75.6*  PLT 471*   < > 399  --  298 238   < > = values in this interval not displayed.   Basic Metabolic Panel:  Recent Labs  Lab 08/14/20 1321 08/15/20 0302  NA 131* 131*  K 4.4 4.5  CL 101 102  CO2 19* 21*  GLUCOSE 174* 146*  BUN 31* 31*  CREATININE 1.31* 1.28*  CALCIUM 8.2* 8.1*   IMAGING past 24 hours CT CHEST W CONTRAST  Result Date: 08/14/2020 CLINICAL DATA:  Abdominal pain, fever, sepsis EXAM: CT CHEST, ABDOMEN, AND PELVIS WITH CONTRAST TECHNIQUE: Multidetector CT imaging of the chest, abdomen and pelvis was performed following the standard protocol during bolus administration of intravenous contrast. CONTRAST:  08/16/2020 OMNIPAQUE IOHEXOL 300 MG/ML  SOLN COMPARISON:  07/29/2020, 06/29/2020 FINDINGS: CT CHEST FINDINGS Cardiovascular: Heart is mildly enlarged with prominent biatrial dilatation. No pericardial effusion. There is calcification of the mitral annulus. The aorta is normal in caliber with no  evidence of aneurysm or dissection. Mediastinum/Nodes: An enteric catheter extends into the gastric lumen. Stable appearance of the thyroid. Trachea is unremarkable. No pathologic adenopathy. Lungs/Pleura: Respiratory motion limits evaluation of the lungs. There is upper lobe predominant interlobular septal thickening, with trace bilateral pleural effusions, suggesting mild edema. No focal consolidation or pneumothorax. Musculoskeletal: No acute or destructive bony lesions. Reconstructed images demonstrate no additional findings. CT ABDOMEN PELVIS FINDINGS Hepatobiliary: No focal liver abnormality is seen. Status post cholecystectomy. No biliary dilatation. Pancreas: Unremarkable. No pancreatic ductal dilatation or surrounding inflammatory changes. Spleen: Normal in size without focal abnormality. Adrenals/Urinary Tract: Evaluation limited due to respiratory motion. There is heterogeneous decreased enhancement of the left kidney, with mild renal edema and perinephric fat stranding. Findings are concerning for acute pyelonephritis. No evidence of renal abscess. Large right renal cyst again noted. The remainder of the right kidney enhances normally. The adrenals are unremarkable. The bladder is moderately distended without focal abnormality. Stomach/Bowel: Enteric catheter within the bladder lumen. No bowel obstruction or ileus. No wall thickening or inflammatory change. Rectal catheter in place. Vascular/Lymphatic: No significant vascular findings are present. No enlarged abdominal or pelvic lymph nodes. Reproductive: Uterus and bilateral adnexa are unremarkable. Other: No free fluid or free gas. Mild subcutaneous  edema within the bilateral flanks. No abdominal wall hernia. Musculoskeletal: No acute or destructive bony lesions. Reconstructed images demonstrate no additional findings. IMPRESSION: 1. Heterogeneous decreased enhancement of the left kidney, with mild renal edema and perinephric fat stranding. Findings  are concerning for acute pyelonephritis. No evidence of renal abscess. 2. Mild cardiomegaly with prominent biatrial dilatation. 3. Trace bilateral pleural effusions and mild interstitial edema. Electronically Signed   By: Sharlet Salina M.D.   On: 08/14/2020 15:59   CT ABDOMEN PELVIS W CONTRAST  Result Date: 08/14/2020 CLINICAL DATA:  Abdominal pain, fever, sepsis EXAM: CT CHEST, ABDOMEN, AND PELVIS WITH CONTRAST TECHNIQUE: Multidetector CT imaging of the chest, abdomen and pelvis was performed following the standard protocol during bolus administration of intravenous contrast. CONTRAST:  OMNIPAQUE IOHEXOL 300 MG/ML  SOLN COMPARISON:  07/29/2020, 06/29/2020 FINDINGS: CT CHEST FINDINGS Cardiovascular: Heart is mildly enlarged with prominent biatrial dilatation. No pericardial effusion. There is calcification of the mitral annulus. The aorta is normal in caliber with no evidence of aneurysm or dissection. Mediastinum/Nodes: An enteric catheter extends into the gastric lumen. Stable appearance of the thyroid. Trachea is unremarkable. No pathologic adenopathy. Lungs/Pleura: Respiratory motion limits evaluation of the lungs. There is upper lobe predominant interlobular septal thickening, with trace bilateral pleural effusions, suggesting mild edema. No focal consolidation or pneumothorax. Musculoskeletal: No acute or destructive bony lesions. Reconstructed images demonstrate no additional findings. CT ABDOMEN PELVIS FINDINGS Hepatobiliary: No focal liver abnormality is seen. Status post cholecystectomy. No biliary dilatation. Pancreas: Unremarkable. No pancreatic ductal dilatation or surrounding inflammatory changes. Spleen: Normal in size without focal abnormality. Adrenals/Urinary Tract: Evaluation limited due to respiratory motion. There is heterogeneous decreased enhancement of the left kidney, with mild renal edema and perinephric fat stranding. Findings are concerning for acute pyelonephritis. No evidence  of renal abscess. Large right renal cyst again noted. The remainder of the right kidney enhances normally. The adrenals are unremarkable. The bladder is moderately distended without focal abnormality. Stomach/Bowel: Enteric catheter within the bladder lumen. No bowel obstruction or ileus. No wall thickening or inflammatory change. Rectal catheter in place. Vascular/Lymphatic: No significant vascular findings are present. No enlarged abdominal or pelvic lymph nodes. Reproductive: Uterus and bilateral adnexa are unremarkable. Other: No free fluid or free gas. Mild subcutaneous edema within the bilateral flanks. No abdominal wall hernia. Musculoskeletal: No acute or destructive bony lesions. Reconstructed images demonstrate no additional findings. IMPRESSION: 1. Heterogeneous decreased enhancement of the left kidney, with mild renal edema and perinephric fat stranding. Findings are concerning for acute pyelonephritis. No evidence of renal abscess. 2. Mild cardiomegaly with prominent biatrial dilatation. 3. Trace bilateral pleural effusions and mild interstitial edema. Electronically Signed   By: Sharlet Salina M.D.   On: 08/14/2020 15:59    PHYSICAL EXAM    General - Well nourished, well developed elderly Asian lady, not in distress  Ophthalmologic - fundi not visualized due to noncooperation.  Cardiovascular - irregularly irregular heart rate and rhythm.  Neuro -  Eyes initially closed, however easily open with voice and maintained opening. Nonverbal, not following commands.  Eyes in right gaze position, not able to cross midline. Not blinking to visual threat on the left but blinking on the right, PERRL. Corneal reflex present, gag and cough present. Left lower facial weakness. Tongue protrusion not cooperative. Cortrak in place. On pain stimulation, active movement of RUE and RLE with RUE at least 4/5 and RLE at least 3-/5. LUE 0/5 and LLE 2-/5 withdraw on pain. DTR 1+ and bilateral  positive babinski.  Sensation not cooperative but brisk response to painful stimulation, coordination not cooperative and gait not tested.   ASSESSMENT/PLAN Samantha Paul is a 74 y.o. female with history of atrial fibrillation, HTN, DM and stroke presenting with right gaze deviation, left-sided hemiplegia and dense receptive and expressive aphasia. She received tPA 07/26/2020 at 1259. Taken to IR for R ICA T occlusion.  Stroke:  R MCA and ACA infarct due to R tICA occlusion s/p tPA and IR w/ TICI2c-3, embolic secondary to known AF but not on AC for at least a week  Code Stroke CT head No acute abnormality.     CTA head & neck R tICA occlusion w/ min MCA flow. Some narrowing B VA and BA.   CT perfusion R frontal core 16 cc with penumbra 136 R MCA territory  Cerebral angio RT ICA T occlusion ->TICI 2C revascularization of RT MCA and a TICI 3 revascularization of RT ACA .  CT head post IR - new R frontal lobe hyper and hypointensity (infarct, edema, petechial hemorrhage)  MRI right MCA and ACA multifocal large infarcts with petechial hemo  CT repeat stable right MCA multifocal infarcts, minimal MLS.  2D Echo 07/03/2020 EF 65-70%. No source of embolus   LDL 90  HgbA1c 12.7  VTE prophylaxis - SCDs   Eliquis (apixaban) daily prior to admission, now on aspirin 81 mg. Hold AC for now given severe anemia needing PRBC, and planned PEG procedure.  Therapy recommendations: SNF  Disposition:  Pending - full code   Palliative Care on board also  Sepsis likely due to C. difficile colitis w/ diarrhea  Code sepsis 8/19   WBC 20.2->22.3->21.3->20.1->49.9->35.5->21.7  TM 102.5->afebrile  Lactic acid 1.5  C. diff Positive   Blood Cx 8/19 NGTD  UA > 50 RBC, 30 Protein. No WBC or bacteria  Urine Cx - 8/19 - no growth  CT chest, abd, pelvis - 08/14/20 - concerning for acute pyelonephritis   Rectal tube remains in place  Flagyl 8/19>>  Cefepime 8/19>>  Vancomycin 8/19  Microcytic anemia   Hgb  8.1->7.5->7.7->6.6 - 6.8 - PRBC - 8.1->7.7->7.3   FOB positive  Fe 15, Sat 5, Fer 634  Rectal tube remains in place  Add iron pills  Atrial Fibrillation w/ RVR  Home anticoagulation:  Eliquis (apixaban) daily - not on for at least a week prior to admission   On diltiazem 240, metoprolol 12.5 bid  PTA  On diltiazem 90mg  Q6  On metoprolol 50 bid  On ASA 81  No AC for now given severe anemia requiring transfusion and planned PEG procedure.   Hypotension  Home meds:  None, no hx HTN . BP stable now . On metoprolol  50 bid . Long-term BP goal normotensive  Hyperlipidemia  Home meds:  crestor 20, resumed in hospital  LDL 90, at goal < 70  Continue statin at discharge  Diabetes type II Uncontrolled  Home meds:  lantus 20 daily, metformin 500 bid  HgbA1c 12.7, goal < 7.0  CBGs  SSI  DM coordinator assistance appreciated  On lantus 22, novolog 5U Q4h  Hypoglycemia during night 8/20 d/t TF on hold for possible PEG - treated w/ D 50  Dysphagia . Secondary to stroke . NPO . Has cortrak . On TF @50  . Trauma consulted for PEG, on hold until next week d/t sepsis . Speech on board   AKI  creatinine 1.37->1.31->1.28  On TF @ 50 + Pro Source 45 ml daily  Off IVF  Hyponatremia, improving  Na - 132->131  Hyperglycemia resolved  Close monitoring  Hx of stroke  06/2018 left pontine infarct likely secondary to small vessel disease source. CTA head and neck neg. EF 65-70%. LDL 140 and A1C 12.3. DAPT x 3 weeks and lipitor 40.  Other Stroke Risk Factors  Advanced age  Other Active Problems  Urinary retention    Hospital day # 20   Marvel PlanJindong Deetta Siegmann, MD PhD Stroke Neurology 08/15/2020 5:10 PM    To contact Stroke Continuity provider, please refer to [email or web address]. After hours, contact General Neurology

## 2020-08-15 NOTE — Progress Notes (Signed)
PROGRESS NOTE  Samantha Paul IRJ:[id number] DOB: [birth year] DOA: 07/26/2020 PCP: Rometta Emery, MD   LOS: 20 days   Brief Narrative / Interim history: 74 year old female with HTN, HLD, DM2, A. fib, admitted with a stroke on 07/26/2020 with symptoms of right gaze deviation, left-sided hemiplegia and aphasia.  CT of the head and neck on admission showed embolic occlusion of the right carotid terminus, and received TPA and underwent interventional revascularization.  She was on neurology service up until 8/19 when she became septic and we were consulted.  She failed speech evaluation on several occasions, remains aphasic, and was scheduled to have a PEG tube placement on 8/20.  She was initially intubated and has remained on 3 L after extubation.  She was treated for Klebsiella pneumonia pneumonia with Zosyn followed by ceftriaxone between 8/7 and 8/15.  On 8/19 developed fever of 100.5, tachycardic, tachypneic.  Chest x-ray was without acute findings.  Urinalysis showed RBCs without leukocytes or nitrates.  She was found to have significant diarrhea, and C. difficile was checked and returned positive.  Subjective / 24h Interval events: She is alert, eyes are open but nonverbal.  Husband is at bedside  Assessment & Plan: Principal Problem Sepsis likely due to C. difficile colitis -patient was septic on 8/19 with elevated WBC to 50 K, febrile, tachycardic and on source.  She was started on broad-spectrum antibiotics as well and cultures were sent. -If blood cultures remain negative today discontinue broad-spectrum intravenous antibiotics and continue C. difficile treatment alone.  WBC improving, she is afebrile and appears improved.   Active Problems Anemia/concern for acute blood loss-fecal occult was positive, however she has been having profuse diarrhea.  She has received a unit of packed red blood cells.  Would favor treating the C. difficile and close monitoring.  If bleeding/anemia persists with  resolution of her diarrhea will consult gastroenterology. -Currently her hemoglobin is 7.3, all lines are down may be dilutional as well.  Monitor.  Type 2 diabetes mellitus-continue Lantus, sliding scale, she had an episode of hypoglycemia probably in the setting of sepsis.  Continue tube feeds, CBGs controlled  CBG (last 3)  Recent Labs    08/15/20 0006 08/15/20 0403 08/15/20 0814  GLUCAP 134* 146* 117*   A. Fib -Continue diltiazem and metoprolol -On Eliquis prior to admission-hold for now due to petechial hemorrhages noted on CT.    Hypertension -Continue metoprolol  Right MCA & ACA stroke -S/p TPA and revascularization by IR: Continue aspirin, statin  Acute respiratory failure with hypoxia-Patient intubated for IR procedure and extubated on 8/14.  Currently requiring 3 L of oxygen via nasal cannula.  Dysphagia -On cortrak tube feedings.  Initial plans for PEG tube were on 8/20 but given sepsis this has been postponed likely next week.  Hyperlipidemia -Continue statin  Thank you for this consultation.  Our [facility] hospitalist team will follow the patient with you.  Scheduled Meds: .  stroke: mapping our early stages of recovery book   Does not apply Once  . aspirin  81 mg Per Tube Daily  . bethanechol  10 mg Per Tube TID  . chlorhexidine  15 mL Mouth Rinse BID  . Chlorhexidine Gluconate Cloth  6 each Topical Daily  . diltiazem  90 mg Per Tube Q6H  . feeding supplement (PROSource TF)  45 mL Per Tube Daily  . insulin aspart  0-15 Units Subcutaneous Q4H  . insulin aspart  5 Units Subcutaneous Q4H  . insulin glargine  22 Units  Subcutaneous Daily  . mouth rinse  15 mL Mouth Rinse q12n4p  . metoprolol tartrate  50 mg Per Tube BID  . rosuvastatin  20 mg Per Tube Daily  . vancomycin  500 mg Per Tube Q6H   Continuous Infusions: . sodium chloride    . ceFEPime (MAXIPIME) IV Stopped (08/15/20 0554)  . feeding supplement (JEVITY 1.2 CAL) 1,000 mL (08/15/20 0955)  .  metronidazole 100 mL/hr at 08/15/20 1000  . vancomycin Stopped (08/15/20 0728)   PRN Meds:.sodium chloride, acetaminophen **OR** acetaminophen (TYLENOL) oral liquid 160 mg/5 mL **OR** acetaminophen, senna-docusate    DVT prophylaxis: Place and maintain sequential compression device Start: 07/26/20 1636 SCD's Start: 07/26/20 1443     Code Status: Prior  Family Communication: Husband at bedside  Status is: Inpatient  Remains inpatient appropriate because:Inpatient level of care appropriate due to severity of illness   Dispo: The patient is from: Home              Anticipated d/c is to: SNF              Anticipated d/c date is: > 3 days              Patient currently is not medically stable to d/c.  Microbiology  C diff 8/19 - positive Blood cultures 8/19 - pending  Antimicrobials: Vancomycin/cefepime/Flagyl 8/19 >> P.o. vancomycin 8/20 >>   Objective: Vitals:   08/14/20 1946 08/14/20 2335 08/15/20 0311 08/15/20 0811  BP: 124/88 117/75 114/79 127/85  Pulse: 89 93 69 93  Resp: (!) 23  Temp: 98.3 F (36.8 C) 98.2 F (36.8 C) 98.2 F (36.8 C) 98.5 F (36.9 C)  TempSrc: Oral Oral Oral Axillary  SpO2: 100% 100% 100% 100%  Weight:      Height:        Intake/Output Summary (Last 24 hours) at 08/15/2020 1138 Last data filed at 08/15/2020 1000 Gross per 24 hour  Intake 3115.78 ml  Output 3100 ml  Net 15.78 ml   Filed Weights   08/10/20 0500 08/11/20 0500 08/12/20 0500  Weight: 68.1 kg 67.2 kg 68.2 kg    Examination:  Constitutional: Alert, nonverbal Eyes: No scleral icterus ENMT: Moist mucous membranes Neck: normal, supple Respiratory: Clear to auscultation bilaterally, no wheeze or crackles heard Cardiovascular: Regular rate and rhythm, no murmurs.  Upper extremity edema Abdomen: Soft, nondistended, does not appear tender, no guarding Musculoskeletal: no clubbing / cyanosis.  Skin: No rashes seen Neurologic: Left hemiplegia   Data Reviewed: I  have independently reviewed following labs and imaging studies   CBC: Recent Labs  Lab 08/12/20 0151 08/12/20 0151 08/13/20 1352 08/13/20 1634 08/13/20 2200 08/14/20 1321 08/15/20 0302  WBC 20.1*  --  49.9* 49.9*  --  35.5* 21.7*  NEUTROABS 16.0*  --   --  44.4*  --   --   --   HGB 7.7*   < > 6.6* 6.8* 8.1* 7.7* 7.3*  HCT 26.3*   < > 22.6* 23.5* 26.7* 25.0* 24.2*  MCV 72.9*  --  73.4* 73.9*  --  75.5* 75.6*  PLT 471*  --  359 399  --  298 238   < > = values in this interval not displayed.   Basic Metabolic Panel: Recent Labs  Lab 08/11/20 0551 08/12/20 0151 08/13/20 1352 08/14/20 1321 08/15/20 0302  NA 132* 131* 132* 131* 131*  K 5.3* 4.9 4.6 4.4 4.5  CL 97* 98 101 101 102  CO2 25 25  21* 19* 21*  GLUCOSE 139* 161* 120* 174* 146*  BUN 31* 30* 35* 31* 31*  CREATININE 1.17* 1.17* 1.37* 1.31* 1.28*  CALCIUM 8.4* 8.3* 8.2* 8.2* 8.1*   Liver Function Tests: Recent Labs  Lab 08/15/20 0302  AST 28  ALT 43  ALKPHOS 159*  BILITOT 0.3  PROT 5.5*  ALBUMIN 1.7*   Coagulation Profile: Recent Labs  Lab 08/13/20 1634  INR 1.2   HbA1C: No results for input(s): HGBA1C in the last 72 hours. CBG: Recent Labs  Lab 08/14/20 1950 08/14/20 2012 08/15/20 0006 08/15/20 0403 08/15/20 0814  GLUCAP 177* 165* 134* 146* 117*    Recent Results (from the past 240 hour(s))  Urine Culture     Status: None   Collection Time: 08/13/20 10:43 AM   Specimen: Urine, Random  Result Value Ref Range Status   Specimen Description URINE, RANDOM  Final   Special Requests NONE  Final   Culture   Final    NO GROWTH Performed at [facility], [address] [place], [place] [address]    Report Status 08/14/2020 FINAL  Final  Culture, blood (routine x 2)     Status: None (Preliminary result)   Collection Time: 08/13/20  1:52 PM   Specimen: BLOOD RIGHT HAND  Result Value Ref Range Status   Specimen Description BLOOD RIGHT HAND  Final   Special Requests   Final    BOTTLES DRAWN  AEROBIC ONLY Blood Culture adequate volume   Culture   Final    NO GROWTH < 24 HOURS Performed at [facility], [address], [place], [place] [address]    Report Status PENDING  Incomplete  Culture, blood (routine x 2)     Status: None (Preliminary result)   Collection Time: 08/13/20  1:58 PM   Specimen: BLOOD RIGHT HAND  Result Value Ref Range Status   Specimen Description BLOOD RIGHT HAND  Final   Special Requests   Final    BOTTLES DRAWN AEROBIC ONLY Blood Culture adequate volume   Culture   Final    NO GROWTH < 24 HOURS Performed at [facility], [address] [place], [place] [address]    Report Status PENDING  Incomplete  C Difficile Quick Screen (NO PCR Reflex)     Status: Abnormal   Collection Time: 08/14/20  1:48 AM   Specimen: STOOL  Result Value Ref Range Status   C Diff antigen POSITIVE (A) NEGATIVE Final   C Diff toxin POSITIVE (A) NEGATIVE Final   C Diff interpretation Toxin producing C. difficile detected.  Final    Comment: CRITICAL RESULT CALLED TO, READ BACK BY AND VERIFIED WITH: Lutricia Horsfall RN 08/14/20 0501 JDW Performed at [facility], [address], [place], [place] [address]      Radiology Studies: CT CHEST W CONTRAST  Result Date: 08/14/2020 CLINICAL DATA:  Abdominal pain, fever, sepsis EXAM: CT CHEST, ABDOMEN, AND PELVIS WITH CONTRAST TECHNIQUE: Multidetector CT imaging of the chest, abdomen and pelvis was performed following the standard protocol during bolus administration of intravenous contrast. CONTRAST:  OMNIPAQUE IOHEXOL 300 MG/ML  SOLN COMPARISON:  07/29/2020, 06/29/2020 FINDINGS: CT CHEST FINDINGS Cardiovascular: Heart is mildly enlarged with prominent biatrial dilatation. No pericardial effusion. There is calcification of the mitral annulus. The aorta is normal in caliber with no evidence of aneurysm or dissection. Mediastinum/Nodes: An enteric catheter extends into the gastric lumen. Stable appearance of the thyroid.  Trachea is unremarkable.  No pathologic adenopathy. Lungs/Pleura: Respiratory motion limits evaluation of the lungs. There is upper lobe predominant interlobular septal thickening, with trace bilateral pleural effusions, suggesting mild edema. No focal consolidation or pneumothorax. Musculoskeletal: No acute or destructive bony lesions. Reconstructed images demonstrate no additional findings. CT ABDOMEN PELVIS FINDINGS Hepatobiliary: No focal liver abnormality is seen. Status post cholecystectomy. No biliary dilatation. Pancreas: Unremarkable. No pancreatic ductal dilatation or surrounding inflammatory changes. Spleen: Normal in size without focal abnormality. Adrenals/Urinary Tract: Evaluation limited due to respiratory motion. There is heterogeneous decreased enhancement of the left kidney, with mild renal edema and perinephric fat stranding. Findings are concerning for acute pyelonephritis. No evidence of renal abscess. Large right renal cyst again noted. The remainder of the right kidney enhances normally. The adrenals are unremarkable. The bladder is moderately distended without focal abnormality. Stomach/Bowel: Enteric catheter within the bladder lumen. No bowel obstruction or ileus. No wall thickening or inflammatory change. Rectal catheter in place. Vascular/Lymphatic: No significant vascular findings are present. No enlarged abdominal or pelvic lymph nodes. Reproductive: Uterus and bilateral adnexa are unremarkable. Other: No free fluid or free gas. Mild subcutaneous edema within the bilateral flanks. No abdominal wall hernia. Musculoskeletal: No acute or destructive bony lesions. Reconstructed images demonstrate no additional findings. IMPRESSION: 1. Heterogeneous decreased enhancement of the left kidney, with mild renal edema and perinephric fat stranding. Findings are concerning for acute pyelonephritis. No evidence of renal abscess. 2. Mild cardiomegaly with prominent biatrial dilatation. 3. Trace  bilateral pleural effusions and mild interstitial edema. Electronically Signed   By: Sharlet SalinaMichael  Brown M.D.   On: 08/14/2020 15:59   CT ABDOMEN PELVIS W CONTRAST  Result Date: 08/14/2020 CLINICAL DATA:  Abdominal pain, fever, sepsis EXAM: CT CHEST, ABDOMEN, AND PELVIS WITH CONTRAST TECHNIQUE: Multidetector CT imaging of the chest, abdomen and pelvis was performed following the standard protocol during bolus administration of intravenous contrast. CONTRAST:  100mL OMNIPAQUE IOHEXOL 300 MG/ML  SOLN COMPARISON:  07/29/2020, 06/29/2020 FINDINGS: CT CHEST FINDINGS Cardiovascular: Heart is mildly enlarged with prominent biatrial dilatation. No pericardial effusion. There is calcification of the mitral annulus. The aorta is normal in caliber with no evidence of aneurysm or dissection. Mediastinum/Nodes: An enteric catheter extends into the gastric lumen. Stable appearance of the thyroid. Trachea is unremarkable. No pathologic adenopathy. Lungs/Pleura: Respiratory motion limits evaluation of the lungs. There is upper lobe predominant interlobular septal thickening, with trace bilateral pleural effusions, suggesting mild edema. No focal consolidation or pneumothorax. Musculoskeletal: No acute or destructive bony lesions. Reconstructed images demonstrate no additional findings. CT ABDOMEN PELVIS FINDINGS Hepatobiliary: No focal liver abnormality is seen. Status post cholecystectomy. No biliary dilatation. Pancreas: Unremarkable. No pancreatic ductal dilatation or surrounding inflammatory changes. Spleen: Normal in size without focal abnormality. Adrenals/Urinary Tract: Evaluation limited due to respiratory motion. There is heterogeneous decreased enhancement of the left kidney, with mild renal edema and perinephric fat stranding. Findings are concerning for acute pyelonephritis. No evidence of renal abscess. Large right renal cyst again noted. The remainder of the right kidney enhances normally. The adrenals are  unremarkable. The bladder is moderately distended without focal abnormality. Stomach/Bowel: Enteric catheter within the bladder lumen. No bowel obstruction or ileus. No wall thickening or inflammatory change. Rectal catheter in place. Vascular/Lymphatic: No significant vascular findings are present. No enlarged abdominal or pelvic lymph nodes. Reproductive: Uterus and bilateral adnexa are unremarkable. Other: No free fluid or free gas. Mild subcutaneous edema within the bilateral flanks. No abdominal wall hernia. Musculoskeletal: No acute or destructive bony lesions. Reconstructed images  demonstrate no additional findings. IMPRESSION: 1. Heterogeneous decreased enhancement of the left kidney, with mild renal edema and perinephric fat stranding. Findings are concerning for acute pyelonephritis. No evidence of renal abscess. 2. Mild cardiomegaly with prominent biatrial dilatation. 3. Trace bilateral pleural effusions and mild interstitial edema. Electronically Signed   By: Sharlet Salina M.D.   On: 08/14/2020 15:59    Pamella Pert, MD, PhD [facility]  Between 7 am - 7 pm I am available, please contact me via Amion or Securechat  Between 7 pm - 7 am I am not available, please contact night coverage MD/APP via Amion

## 2020-08-16 ENCOUNTER — Inpatient Hospital Stay (HOSPITAL_COMMUNITY): Payer: Medicare Other

## 2020-08-16 DIAGNOSIS — A0472 Enterocolitis due to Clostridium difficile, not specified as recurrent: Secondary | ICD-10-CM

## 2020-08-16 DIAGNOSIS — R0682 Tachypnea, not elsewhere classified: Secondary | ICD-10-CM

## 2020-08-16 DIAGNOSIS — I10 Essential (primary) hypertension: Secondary | ICD-10-CM

## 2020-08-16 LAB — CBC
HCT: 24.6 % — ABNORMAL LOW (ref 36.0–46.0)
Hemoglobin: 7.3 g/dL — ABNORMAL LOW (ref 12.0–15.0)
MCH: 22.5 pg — ABNORMAL LOW (ref 26.0–34.0)
MCHC: 29.7 g/dL — ABNORMAL LOW (ref 30.0–36.0)
MCV: 75.9 fL — ABNORMAL LOW (ref 80.0–100.0)
Platelets: 306 10*3/uL (ref 150–400)
RBC: 3.24 MIL/uL — ABNORMAL LOW (ref 3.87–5.11)
RDW: 21.2 % — ABNORMAL HIGH (ref 11.5–15.5)
WBC: 12.8 10*3/uL — ABNORMAL HIGH (ref 4.0–10.5)
nRBC: 0 % (ref 0.0–0.2)

## 2020-08-16 LAB — GLUCOSE, CAPILLARY
Glucose-Capillary: 111 mg/dL — ABNORMAL HIGH (ref 70–99)
Glucose-Capillary: 112 mg/dL — ABNORMAL HIGH (ref 70–99)
Glucose-Capillary: 117 mg/dL — ABNORMAL HIGH (ref 70–99)
Glucose-Capillary: 123 mg/dL — ABNORMAL HIGH (ref 70–99)
Glucose-Capillary: 132 mg/dL — ABNORMAL HIGH (ref 70–99)
Glucose-Capillary: 154 mg/dL — ABNORMAL HIGH (ref 70–99)
Glucose-Capillary: 68 mg/dL — ABNORMAL LOW (ref 70–99)

## 2020-08-16 LAB — BASIC METABOLIC PANEL
Anion gap: 7 (ref 5–15)
BUN: 31 mg/dL — ABNORMAL HIGH (ref 8–23)
CO2: 22 mmol/L (ref 22–32)
Calcium: 8.3 mg/dL — ABNORMAL LOW (ref 8.9–10.3)
Chloride: 104 mmol/L (ref 98–111)
Creatinine, Ser: 1.3 mg/dL — ABNORMAL HIGH (ref 0.44–1.00)
GFR calc Af Amer: 47 mL/min — ABNORMAL LOW (ref >60)
GFR calc non Af Amer: 41 mL/min — ABNORMAL LOW (ref >60)
Glucose, Bld: 140 mg/dL — ABNORMAL HIGH (ref 70–99)
Potassium: 4.3 mmol/L (ref 3.5–5.1)
Sodium: 133 mmol/L — ABNORMAL LOW (ref 135–145)

## 2020-08-16 MED ORDER — DEXTROSE 50 % IV SOLN
INTRAVENOUS | Status: AC
  Administered 2020-08-16: 25 mL
  Filled 2020-08-16: qty 50

## 2020-08-16 NOTE — Progress Notes (Addendum)
[facility]  PROGRESS NOTE  Samantha Paul SWN:[id number] DOB: 1946/02/29 DOA: 07/26/2020 PCP: Rometta Emery, MD Admit date - 07/26/2020   Admitting Physician Caryl Pina, MD  Outpatient Primary MD for the patient is Rometta Emery, MD  LOS - 21 Brief Narrative   Samantha Paul is a 74 y.o. year old female with medical history significant for atrial fibrillation with poor adherence to Eliquis, diabetes, HTN, prior CVA who presented on 07/26/2020 with acute onset right gaze deviation, left-sided hemiplegia receptive/expressive aphasia after unwitnessed fall heard by her husband who found her in the kitchen with the above findings.  Code stroke was called and CT head showed no acute findings, but CTA head/neck showed embolic occlusion of the right carotid with minimal flow seen in the right MCA territory with large penumbra remainder of the right MCA territory.  Patient was admitted to neuro ICU and underwent TPA thrombectomy patient was intubated for revascularization procedure and required prolonged mechanical ventilation due to poor mentation before successful extubation on 8/14.  Hospital course was also complicated by Klebsiella pneumonia (respiratory culture on 8/8) requiring course of Zosyn followed by ceftriaxone for total of 9 days (8/7-8/15)  [facility] was consulted on 8/19 in the setting of code sepsis as patient was febrile with T-max of one 2.5, tachycardic, tachypneic, WBC of 49.9.  She was empirically started on vancomycin and cefepime for HCAP.  C. difficile was sent given diarrhea and was found to be positive so patient was started on oral vancomycin and IV Flagyl.  In work-up CT abdomen also showed concern for potential pyelonephritis, but UA was essentially unremarkable    Subjective  Today husband is at bedside.  Nursing noted patient seemed to have increased work of breathing this morning with increased oral secretions  A & P  Sepsis secondary to C. difficile colitis, improving.   Afebrile greater than 48 hours, blood cultures remain unremarkable x48 hours, leukocytosis downtrending (20--12.8), sepsis physiology resolved -Continue oral vancomycin, IV Flagyl -DC IV vancomycin -Continue IV cefepime given query for potential pyelonephritis on CT abdomen on 8/20 -Continue monitor stool output -Repeat chest x-ray this a.m. with no acute etiologies  Acute right MCA stroke right ICA total occlusion, Status post TPA and revascularization by IR on 8/1 Dysphagia, secondary to acute CVA Patient has dense left hemiplegia and unable to handle oral secretions or support herself nutritionally.  Continues to have poor ability to clear oral secretions, at high risk for aspiration pneumonia.  Chest x-ray this a.m. showed cardiomegaly with no edema, no opacities. -Continue aspirin statin -Currently n.p.o. with core track feeding tube as recommended by ST -Surgery plans for PEG tube once stable from infection standpoint -Palliative care has been assisting with goals of care discussions  Atrial fibrillation, currently rate controlled.  Patient was nonadherent with home Eliquis prior to admission -Diltiazem 90 mg every 6 hours -Lopressor 50 mg twice daily -Neurology recommends holding off on home Eliquis given large infarct, petechial hemorrhaging, and eventual need for PEG procedure  Acute on chronic deficiency anemia table, stable. Iron panel consistent with deficiency. Acutely worsened in setting of c.diff colitis. Hgb stable at 7.3 -Oral iron 3 times daily, via core track --transfuse to keep hgb goal > 7  Type 2 diabetes, controlled. A1c 8.7. FBG at goal -Holding home Metformin -Lantus 22 units daily -NovoLog 5 units every 4 hours for 2 weeks protocol  HLD, stable on-Crestor   Family Communication  : Husband updated at bedside on 8/22 via assistance from  translator present  Code Status :  FULL CODE  Disposition Plan  :  Patient is from home. Anticipated d/c date:  Greater  than 3 days. Barriers to d/c or necessity for inpatient status:  Currently requiring IV antibiotics for C. difficile colitis and likely pyelonephritis, close monitoring of feeding/neurologic status/respiratory status  Procedures  :   8/4 Cortrak 8/1 revascularization of right ACA by IR  DVT Prophylaxis  :   SCDs   Lab Results  Component Value Date   PLT 306 08/16/2020    Diet :  Diet Order    None       Inpatient Medications Scheduled Meds: .  stroke: mapping our early stages of recovery book   Does not apply Once  . aspirin  81 mg Per Tube Daily  . bethanechol  10 mg Per Tube TID  . chlorhexidine  15 mL Mouth Rinse BID  . Chlorhexidine Gluconate Cloth  6 each Topical Daily  . diltiazem  90 mg Per Tube Q6H  . feeding supplement (PROSource TF)  45 mL Per Tube Daily  . ferrous sulfate  325 mg Oral TID WC  . insulin aspart  0-15 Units Subcutaneous Q4H  . insulin aspart  5 Units Subcutaneous Q4H  . insulin glargine  22 Units Subcutaneous Daily  . mouth rinse  15 mL Mouth Rinse q12n4p  . metoprolol tartrate  50 mg Per Tube BID  . rosuvastatin  20 mg Per Tube Daily  . vancomycin  500 mg Per Tube Q6H   Continuous Infusions: . sodium chloride    . ceFEPime (MAXIPIME) IV 2 g (08/16/20 0357)  . feeding supplement (JEVITY 1.2 CAL) 50 mL/hr at 08/15/20 1500  . metronidazole 500 mg (08/16/20 0630)  . vancomycin Stopped (08/15/20 0728)   PRN Meds:.sodium chloride, acetaminophen **OR** acetaminophen (TYLENOL) oral liquid 160 mg/5 mL **OR** acetaminophen, senna-docusate  Antibiotics  :   Anti-infectives (From admission, onward)   Start     Dose/Rate Route Frequency Ordered Stop   08/15/20 0500  vancomycin (VANCOCIN) IVPB 1000 mg/200 mL premix        1,000 mg 200 mL/hr over 60 Minutes Intravenous Every 36 hours 08/13/20 1657     08/14/20 2000  vancomycin (VANCOCIN) 50 mg/mL oral solution 500 mg        500 mg Per Tube Every 6 hours 08/14/20 1820 08/28/20 0159   08/14/20 0730   vancomycin (VANCOCIN) 50 mg/mL oral solution 500 mg  Status:  Discontinued        500 mg Oral Every 6 hours 08/14/20 0649 08/14/20 1820   08/14/20 0730  metroNIDAZOLE (FLAGYL) IVPB 500 mg        500 mg 100 mL/hr over 60 Minutes Intravenous Every 8 hours 08/14/20 0649 08/28/20 0729   08/13/20 1700  vancomycin (VANCOREADY) IVPB 1250 mg/250 mL        1,250 mg 166.7 mL/hr over 90 Minutes Intravenous  Once 08/13/20 1656 08/13/20 1958   08/13/20 1630  ceFEPIme (MAXIPIME) 2 g in sodium chloride 0.9 % 100 mL IVPB        2 g 200 mL/hr over 30 Minutes Intravenous Every 12 hours 08/13/20 1622     08/13/20 1600  ampicillin-sulbactam (UNASYN) 1.5 g in sodium chloride 0.9 % 100 mL IVPB  Status:  Discontinued        1.5 g 200 mL/hr over 30 Minutes Intravenous Every 8 hours 08/13/20 1447 08/13/20 1558   08/06/20 1630  cefTRIAXone (ROCEPHIN) 2 g in sodium  chloride 0.9 % 100 mL IVPB        2 g 200 mL/hr over 30 Minutes Intravenous Every 24 hours 08/06/20 1325 08/09/20 1701   08/03/20 1630  cefTRIAXone (ROCEPHIN) 2 g in sodium chloride 0.9 % 100 mL IVPB  Status:  Discontinued        2 g 200 mL/hr over 30 Minutes Intravenous Every 24 hours 08/03/20 1547 08/06/20 1325   08/01/20 1200  piperacillin-tazobactam (ZOSYN) IVPB 3.375 g  Status:  Discontinued        3.375 g 12.5 mL/hr over 240 Minutes Intravenous Every 8 hours 08/01/20 1134 08/03/20 1509   07/26/20 1336  ceFAZolin (ANCEF) 2-4 GM/100ML-% IVPB       Note to Pharmacy: [facility]   : cabinet override      07/26/20 1336 07/27/20 0144       Objective   Vitals:   08/16/20 0834 08/16/20 0900 08/16/20 1059 08/16/20 1200  BP: 112/80 121/89 124/71 121/76  Pulse: 87 97 88 90  Resp: (!) 27 (!) 23 19 20   Temp: 98.3 F (36.8 C) 98.1 F (36.7 C) 98 F (36.7 C) 98 F (36.7 C)  TempSrc:  Axillary Axillary Axillary  SpO2: 100% 100% 100% 100%  Weight:      Height:        SpO2: 100 % O2 Flow Rate (L/min): 305 L/min FiO2 (%): 40 %  Wt Readings  from Last 3 Encounters:  08/16/20 72.6 kg  06/29/20 81.6 kg  08/29/18 58.1 kg     Intake/Output Summary (Last 24 hours) at 08/16/2020 1250 Last data filed at 08/16/2020 1245 Gross per 24 hour  Intake 646.13 ml  Output 3700 ml  Net -3053.87 ml    Physical Exam:     Lying in bed, resting comfortably Left hemiplegia Sporadically moves right hand, able to squeeze right hand Copious oral secretions and mild No tachypnea, increased upper airway sounds Regular rate, no peripheral edema noted Abdomen soft   I have personally reviewed the following:   Data Reviewed:  CBC Recent Labs  Lab 08/12/20 0151 08/12/20 0151 08/13/20 1352 08/13/20 1352 08/13/20 1634 08/13/20 2200 08/14/20 1321 08/15/20 0302 08/16/20 0203  WBC 20.1*   < > 49.9*  --  49.9*  --  35.5* 21.7* 12.8*  HGB 7.7*   < > 6.6*   < > 6.8* 8.1* 7.7* 7.3* 7.3*  HCT 26.3*   < > 22.6*   < > 23.5* 26.7* 25.0* 24.2* 24.6*  PLT 471*   < > 359  --  399  --  298 238 306  MCV 72.9*   < > 73.4*  --  73.9*  --  75.5* 75.6* 75.9*  MCH 21.3*   < > 21.4*  --  21.4*  --  23.3* 22.8* 22.5*  MCHC 29.3*   < > 29.2*  --  28.9*  --  30.8 30.2 29.7*  RDW 18.8*   < > 19.5*  --  19.8*  --  19.9* 20.3* 21.2*  LYMPHSABS 1.7  --   --   --  2.0  --   --   --   --   MONOABS 1.7*  --   --   --  2.5*  --   --   --   --   EOSABS 0.1  --   --   --  0.5  --   --   --   --   BASOSABS 0.1  --   --   --  0.0  --   --   --   --    < > = values in this interval not displayed.    Chemistries  Recent Labs  Lab 08/12/20 0151 08/13/20 1352 08/14/20 1321 08/15/20 0302 08/16/20 0203  NA 131* 132* 131* 131* 133*  K 4.9 4.6 4.4 4.5 4.3  CL 98 101 101 102 104  CO2 25 21* 19* 21* 22  GLUCOSE 161* 120* 174* 146* 140*  BUN 30* 35* 31* 31* 31*  CREATININE 1.17* 1.37* 1.31* 1.28* 1.30*  CALCIUM 8.3* 8.2* 8.2* 8.1* 8.3*  AST  --   --   --  28  --   ALT  --   --   --  43  --   ALKPHOS  --   --   --  159*  --   BILITOT  --   --   --  0.3  --     ------------------------------------------------------------------------------------------------------------------ No results for input(s): CHOL, HDL, LDLCALC, TRIG, CHOLHDL, LDLDIRECT in the last 72 hours.  Lab Results  Component Value Date   HGBA1C 8.7 (H) 07/27/2020   ------------------------------------------------------------------------------------------------------------------ No results for input(s): TSH, T4TOTAL, T3FREE, THYROIDAB in the last 72 hours.  Invalid input(s): FREET3 ------------------------------------------------------------------------------------------------------------------ Recent Labs    08/13/20 1930  FERRITIN 634*  TIBC 302  IRON 15*    Coagulation profile Recent Labs  Lab 08/13/20 1634  INR 1.2    No results for input(s): DDIMER in the last 72 hours.  Cardiac Enzymes No results for input(s): CKMB, TROPONINI, MYOGLOBIN in the last 168 hours.  Invalid input(s): CK ------------------------------------------------------------------------------------------------------------------ No results found for: BNP  Micro Results Recent Results (from the past 240 hour(s))  Urine Culture     Status: None   Collection Time: 08/13/20 10:43 AM   Specimen: Urine, Random  Result Value Ref Range Status   Specimen Description URINE, RANDOM  Final   Special Requests NONE  Final   Culture   Final    NO GROWTH Performed at [facility], [address] [place], [place] [address]    Report Status 08/14/2020 FINAL  Final  Culture, blood (routine x 2)     Status: None (Preliminary result)   Collection Time: 08/13/20  1:52 PM   Specimen: BLOOD RIGHT HAND  Result Value Ref Range Status   Specimen Description BLOOD RIGHT HAND  Final   Special Requests   Final    BOTTLES DRAWN AEROBIC ONLY Blood Culture adequate volume   Culture   Final    NO GROWTH 3 DAYS Performed at [facility], [address], [place], [place] [address]    Report Status  PENDING  Incomplete  Culture, blood (routine x 2)     Status: None (Preliminary result)   Collection Time: 08/13/20  1:58 PM   Specimen: BLOOD RIGHT HAND  Result Value Ref Range Status   Specimen Description BLOOD RIGHT HAND  Final   Special Requests   Final    BOTTLES DRAWN AEROBIC ONLY Blood Culture adequate volume   Culture   Final    NO GROWTH 3 DAYS Performed at [facility], [address], [place], [place] [address]    Report Status PENDING  Incomplete  C Difficile Quick Screen (NO PCR Reflex)     Status: Abnormal   Collection Time: 08/14/20  1:48 AM   Specimen: STOOL  Result Value Ref Range Status   C Diff antigen POSITIVE (A) NEGATIVE Final   C  Diff toxin POSITIVE (A) NEGATIVE Final   C Diff interpretation Toxin producing C. difficile detected.  Final    Comment: CRITICAL RESULT CALLED TO, READ BACK BY AND VERIFIED WITH: Lutricia Horsfall RN 08/14/20 0501 JDW Performed at [facility], [address] [place], [place] [address]     Radiology Reports CT HEAD WO CONTRAST  Result Date: 08/01/2020 CLINICAL DATA:  Stroke follow-up. Postop endovascular revascularization of occluded right internal carotid artery distally. EXAM: CT HEAD WITHOUT CONTRAST TECHNIQUE: Contiguous axial images were obtained from the base of the skull through the vertex without intravenous contrast. COMPARISON:  CT head 07/27/2020.  MRI head 07/27/2020 FINDINGS: Brain: Involving acute right MCA infarct. There is progressive edema in the right insula and right frontal lobe. There is involvement of the basal ganglia with a mild amount of hemorrhage in the right basal ganglia. This is unchanged. This is lateral to the area of calcification in the right basal ganglia. Acute infarct in the caudate is more apparent and lower density today. Right medial frontal lobe infarct also shows progressive edema. Small area of infarct in the right occipital lobe best seen on diffusion-weighted imaging. Generalized atrophy.  Negative for hydrocephalus. 1 mm midline shift to the left. No new area of hemorrhage. Chronic infarct in the left pons. Vascular: Negative for hyperdense vessel Skull: Negative Sinuses/Orbits: Mucosal edema paranasal sinuses.  Negative orbit Other: None IMPRESSION: Evolving acute right MCA infarct. Low-density edema infarction is more apparent now in the insula and frontal lobe and basal ganglia. There is progressive low-density in the head of the caudate which did show infarct on prior diffusion imaging. Small amount of hemorrhage in the right basal ganglia is unchanged from the prior CT. Right frontal ACA infarct is unchanged in size but is lower density today. Small acute infarct right occipital lobe best seen on diffusion-weighted imaging. No new area of hemorrhage since the prior studies. Electronically Signed   By: Marlan Palau M.D.   On: 08/01/2020 15:43   CT HEAD WO CONTRAST  Addendum Date: 07/27/2020   ADDENDUM REPORT: 07/27/2020 13:27 ADDENDUM: Study discussed by telephone with Dr. Pearlean Brownie on 07/27/2020 at 1313 hours. He advised that follow-up MRI is pending. Electronically Signed   By: Odessa Fleming M.D.   On: 07/27/2020 13:27   Result Date: 07/27/2020 CLINICAL DATA:  74 year old female status post code stroke presentation with right ICA terminus occlusion. Status post IV tPA, endovascular revascularization. EXAM: CT HEAD WITHOUT CONTRAST TECHNIQUE: Contiguous axial images were obtained from the base of the skull through the vertex without intravenous contrast. COMPARISON:  Post treatment plain head CT, CTA head, CT Perfusion, presentation head CT yesterday. FINDINGS: Brain: Mass effect now on the right lateral ventricle, especially the right frontal horn (series 6, image 13) with mixed hypo- and hyper density in the anterior right frontal lobe, right inferior frontal gyrus and right basal ganglia (series 6, image 13). No significant midline shift. No extra-axial hemorrhage or ventriculomegaly. Superimposed  dystrophic basal ganglia calcifications are stable. Posterior to the anterior genu gray-white matter differentiation appears largely stable. There is possible early cytotoxic edema at the posterior operculum. Stable left hemisphere and posterior fossa gray-white matter differentiation. No other intracranial blood products. Vascular: Calcified atherosclerosis at the skull base. No suspicious intracranial vascular hyperdensity. Skull: No acute osseous abnormality identified. Sinuses/Orbits: Intubated, small volume of fluid layering in the pharynx. Mild paranasal sinus fluid or mucosal thickening. Tympanic cavities and mastoids remain clear. Other: Disconjugate gaze now.  Scalp soft  tissues remain negative. IMPRESSION: 1. New mass effect in the anterior right frontal lobe with regional increasing mixed hypo- and hyperdensity. Favor a combination of anterior right MCA territory cytotoxic edema and petechial hemorrhage. 2. No malignant hemorrhagic transformation at this time. No midline shift or loss of basilar cisterns. 3. Serial noncontrast Head CT for surveillance of mass effect recommended. Electronically Signed: By: Odessa Fleming M.D. On: 07/27/2020 12:34   CT HEAD WO CONTRAST  Result Date: 07/26/2020 CLINICAL DATA:  Stroke, follow-up post revascularization. EXAM: CT HEAD WITHOUT CONTRAST TECHNIQUE: Contiguous axial images were obtained from the base of the skull through the vertex without intravenous contrast. COMPARISON:  CT angiogram head/neck and CT perfusion 07/26/2020, noncontrast head CT 07/26/2020 FINDINGS: Brain: Redemonstrated mineralization within the bilateral basal ganglia. There is subtle loss of gray-white differentiation within the right insula consistent with acute ischemic infarction. Additionally, there is subtle asymmetric hyperdensity of the right caudate and lentiform nuclei which may reflect contrast staining/enhancement at site of acute infarction. No convincing evidence of acute intracranial  hemorrhage. Stable background generalized parenchymal atrophy and chronic small vessel ischemic disease. No extra-axial fluid collection. No evidence of intracranial mass. No midline shift. Vascular: Residual circulating contrast material limits evaluation for hyperdense vessels Skull: Normal. Negative for fracture or focal lesion. Sinuses/Orbits: Visualized orbits show no acute finding. Mild ethmoid and maxillary sinus mucosal thickening at the imaged levels. No significant mastoid effusion IMPRESSION: Subtle changes of acute ischemic infarction within the right insula. Additionally, there is subtle asymmetric hyperdensity of the right caudate and lentiform nuclei which may reflect contrast staining or enhancement at site of acute infarction. No convincing evidence of acute intracranial hemorrhage. Stable background generalized parenchymal atrophy and chronic small vessel ischemic disease. Electronically Signed   By: Jackey Loge DO   On: 07/26/2020 15:47   CT CHEST W CONTRAST  Result Date: 08/14/2020 CLINICAL DATA:  Abdominal pain, fever, sepsis EXAM: CT CHEST, ABDOMEN, AND PELVIS WITH CONTRAST TECHNIQUE: Multidetector CT imaging of the chest, abdomen and pelvis was performed following the standard protocol during bolus administration of intravenous contrast. CONTRAST:  OMNIPAQUE IOHEXOL 300 MG/ML  SOLN COMPARISON:  07/29/2020, 06/29/2020 FINDINGS: CT CHEST FINDINGS Cardiovascular: Heart is mildly enlarged with prominent biatrial dilatation. No pericardial effusion. There is calcification of the mitral annulus. The aorta is normal in caliber with no evidence of aneurysm or dissection. Mediastinum/Nodes: An enteric catheter extends into the gastric lumen. Stable appearance of the thyroid. Trachea is unremarkable. No pathologic adenopathy. Lungs/Pleura: Respiratory motion limits evaluation of the lungs. There is upper lobe predominant interlobular septal thickening, with trace bilateral pleural effusions,  suggesting mild edema. No focal consolidation or pneumothorax. Musculoskeletal: No acute or destructive bony lesions. Reconstructed images demonstrate no additional findings. CT ABDOMEN PELVIS FINDINGS Hepatobiliary: No focal liver abnormality is seen. Status post cholecystectomy. No biliary dilatation. Pancreas: Unremarkable. No pancreatic ductal dilatation or surrounding inflammatory changes. Spleen: Normal in size without focal abnormality. Adrenals/Urinary Tract: Evaluation limited due to respiratory motion. There is heterogeneous decreased enhancement of the left kidney, with mild renal edema and perinephric fat stranding. Findings are concerning for acute pyelonephritis. No evidence of renal abscess. Large right renal cyst again noted. The remainder of the right kidney enhances normally. The adrenals are unremarkable. The bladder is moderately distended without focal abnormality. Stomach/Bowel: Enteric catheter within the bladder lumen. No bowel obstruction or ileus. No wall thickening or inflammatory change. Rectal catheter in place. Vascular/Lymphatic: No significant vascular findings are present. No enlarged abdominal or pelvic  lymph nodes. Reproductive: Uterus and bilateral adnexa are unremarkable. Other: No free fluid or free gas. Mild subcutaneous edema within the bilateral flanks. No abdominal wall hernia. Musculoskeletal: No acute or destructive bony lesions. Reconstructed images demonstrate no additional findings. IMPRESSION: 1. Heterogeneous decreased enhancement of the left kidney, with mild renal edema and perinephric fat stranding. Findings are concerning for acute pyelonephritis. No evidence of renal abscess. 2. Mild cardiomegaly with prominent biatrial dilatation. 3. Trace bilateral pleural effusions and mild interstitial edema. Electronically Signed   By: Sharlet Salina M.D.   On: 08/14/2020 15:59   MR BRAIN WO CONTRAST  Result Date: 07/27/2020 CLINICAL DATA:  Stroke follow-up EXAM: MRI HEAD  WITHOUT CONTRAST TECHNIQUE: Multiplanar, multiecho pulse sequences of the brain and surrounding structures were obtained without intravenous contrast. COMPARISON:  Brain MRI 07/10/2018 FINDINGS: Brain: Multifocal ischemic infarction within the right MCA territory, greatest at the frontal operculum and insula. There is also cortical ischemia within the right ACA territory and right PCA territory. There is a punctate focus of acute ischemia in the left occipital lobe. Large area of infarction in the right basal ganglia. There is petechial hemorrhage versus is contrast staining in the right basal ganglia as well. Moderate edema associated with the ischemic areas. There is mass effect on the frontal horn of the right lateral ventricle. No herniation. There is an old left pontine infarct. No chronic microhemorrhage. Normal midline structures. Vascular: Normal flow voids. Skull and upper cervical spine: Normal marrow signal. Sinuses/Orbits: Right maxillary sinus mucosal thickening. Normal orbits. Other: None. IMPRESSION: 1. Multifocal ischemic infarction within the right MCA territory, greatest at the frontal operculum and insula. 2. Additional cortical ischemia within the right ACA territory and right PCA territory. 3. Large area of infarction in the right basal ganglia with petechial hemorrhage versus contrast staining. 4. Punctate focus of acute ischemia in the left occipital lobe. Electronically Signed   By: Deatra Robinson M.D.   On: 07/27/2020 23:50   CT ABDOMEN PELVIS W CONTRAST  Result Date: 08/14/2020 CLINICAL DATA:  Abdominal pain, fever, sepsis EXAM: CT CHEST, ABDOMEN, AND PELVIS WITH CONTRAST TECHNIQUE: Multidetector CT imaging of the chest, abdomen and pelvis was performed following the standard protocol during bolus administration of intravenous contrast. CONTRAST:  OMNIPAQUE IOHEXOL 300 MG/ML  SOLN COMPARISON:  07/29/2020, 06/29/2020 FINDINGS: CT CHEST FINDINGS Cardiovascular: Heart is mildly  enlarged with prominent biatrial dilatation. No pericardial effusion. There is calcification of the mitral annulus. The aorta is normal in caliber with no evidence of aneurysm or dissection. Mediastinum/Nodes: An enteric catheter extends into the gastric lumen. Stable appearance of the thyroid. Trachea is unremarkable. No pathologic adenopathy. Lungs/Pleura: Respiratory motion limits evaluation of the lungs. There is upper lobe predominant interlobular septal thickening, with trace bilateral pleural effusions, suggesting mild edema. No focal consolidation or pneumothorax. Musculoskeletal: No acute or destructive bony lesions. Reconstructed images demonstrate no additional findings. CT ABDOMEN PELVIS FINDINGS Hepatobiliary: No focal liver abnormality is seen. Status post cholecystectomy. No biliary dilatation. Pancreas: Unremarkable. No pancreatic ductal dilatation or surrounding inflammatory changes. Spleen: Normal in size without focal abnormality. Adrenals/Urinary Tract: Evaluation limited due to respiratory motion. There is heterogeneous decreased enhancement of the left kidney, with mild renal edema and perinephric fat stranding. Findings are concerning for acute pyelonephritis. No evidence of renal abscess. Large right renal cyst again noted. The remainder of the right kidney enhances normally. The adrenals are unremarkable. The bladder is moderately distended without focal abnormality. Stomach/Bowel: Enteric catheter within the bladder lumen. No  bowel obstruction or ileus. No wall thickening or inflammatory change. Rectal catheter in place. Vascular/Lymphatic: No significant vascular findings are present. No enlarged abdominal or pelvic lymph nodes. Reproductive: Uterus and bilateral adnexa are unremarkable. Other: No free fluid or free gas. Mild subcutaneous edema within the bilateral flanks. No abdominal wall hernia. Musculoskeletal: No acute or destructive bony lesions. Reconstructed images demonstrate no  additional findings. IMPRESSION: 1. Heterogeneous decreased enhancement of the left kidney, with mild renal edema and perinephric fat stranding. Findings are concerning for acute pyelonephritis. No evidence of renal abscess. 2. Mild cardiomegaly with prominent biatrial dilatation. 3. Trace bilateral pleural effusions and mild interstitial edema. Electronically Signed   By: Sharlet Salina M.D.   On: 08/14/2020 15:59   CT CEREBRAL PERFUSION W CONTRAST  Result Date: 07/26/2020 CLINICAL DATA:  Acute presentation with left body weakness and speech disturbance. EXAM: CT ANGIOGRAPHY HEAD AND NECK CT PERFUSION BRAIN TECHNIQUE: Multidetector CT imaging of the head and neck was performed using the standard protocol during bolus administration of intravenous contrast. Multiplanar CT image reconstructions and MIPs were obtained to evaluate the vascular anatomy. Carotid stenosis measurements (when applicable) are obtained utilizing NASCET criteria, using the distal internal carotid diameter as the denominator. Multiphase CT imaging of the brain was performed following IV bolus contrast injection. Subsequent parametric perfusion maps were calculated using RAPID software. CONTRAST:  Not annotated COMPARISON:  Head CT earlier same day FINDINGS: CTA NECK FINDINGS Aortic arch: No aortic atherosclerotic calcification seen. No aneurysm or dissection. Branching pattern is normal with wide patency of the origins. Right carotid system: Common carotid artery widely patent to the bifurcation. No atherosclerotic disease at the carotid bifurcation. ICA widely patent. Left carotid system: Common carotid artery widely patent to the bifurcation. Carotid bifurcation is normal without soft or calcified plaque. ICA widely patent. Vertebral arteries: Both vertebral artery origins are widely patent. Both vertebral arteries are normal through the cervical region to the foramen magnum. Skeleton: Ordinary cervical spondylosis. Other neck: No mass or  lymphadenopathy. Upper chest: No active chest disease. 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 Review of the MIP images confirms the above findings CTA HEAD FINDINGS Anterior circulation: Complete occlusion of the right carotid terminus. Minimal flow visible in the right MCA territory. Right ACA receives it supply through the anterior communicating route. No left stenosis or occlusion. Right PCA arises from the carotid artery immediately proximal to the occlusion. Posterior circulation: Both vertebral arteries patent to the basilar. Atherosclerotic narrowing the distal vertebral arteries and of the basilar. Posterior circulation branch vessels are patent. Right PCA receives most of it supply from the anterior circulation. Venous sinuses: Patent and normal. Anatomic variants: None significant. 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 Minimal flow seen in the right MCA territory. Right ACA territory appears normally perfused, probably due to a patent anterior communicating artery. 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 Large penumbra, 136 cc in the remainder of the right MCA territory. No aortic atherosclerotic disease seen. No atherosclerotic disease at either carotid bifurcation. Some atherosclerotic narrowing of the distal vertebral arteries and basilar artery. Preliminary report sent by text at 1238 hours. Discussed with stroke PA 1242 hours. Electronically Signed   By: Scherrie Bateman.D.  On: 07/26/2020 12:48   DG CHEST PORT 1 VIEW  Result Date: 08/16/2020 CLINICAL DATA:  74 year old female with history of stroke. EXAM: PORTABLE CHEST 1 VIEW COMPARISON:  Chest x-ray 08/13/2020. FINDINGS: Feeding  tube extending into the stomach with tip coiled back in the proximal stomach. Lung volumes are normal. No consolidative airspace disease. No pleural effusions. No evidence of pulmonary edema. No pneumothorax. No suspicious appearing pulmonary nodules or masses are noted. Mild cardiomegaly. Upper mediastinal contours are within normal limits allowing for patient positioning. Surgical clips project over the right upper quadrant of the abdomen, likely from prior cholecystectomy. IMPRESSION: 1. Tip of feeding tube is in the proximal stomach. 2. No radiographic evidence of acute cardiopulmonary disease. 3. Mild cardiomegaly. Electronically Signed   By: Trudie Reedaniel  Entrikin M.D.   On: 08/16/2020 10:09   DG CHEST PORT 1 VIEW  Result Date: 08/13/2020 CLINICAL DATA:  Febrile with leukocytosis. Dysphagia due to recent cerebrovascular accident. EXAM: PORTABLE CHEST 1 VIEW COMPARISON:  08/05/2020 FINDINGS: Feeding tube tip projects at the gastric cardia, similar to prior. Cardiomediastinal silhouette is similar to prior. Similar mild elevation the right hemidiaphragm. No consolidation. Linear bibasilar opacities, favored to represent atelectasis. No sizable pleural effusion or discernible pneumothorax. Biapical pleuroparenchymal scarring. The visualized skeletal structures are unremarkable. Apparent widening of the left glenohumeral joint. IMPRESSION: 1. No evidence of consolidation, although portable technique limits evaluation of the lung bases. Dedicated PA and lateral radiographs could better evaluate if the patient is able. 2. Apparent widening of the left glenohumeral joint. Consider dedicated left shoulder radiographs to further evaluate. Electronically Signed   By: Feliberto HartsFrederick S Jones MD   On: 08/13/2020 11:54   DG CHEST PORT 1 VIEW  Result Date: 08/05/2020 CLINICAL DATA:  Fever EXAM: PORTABLE CHEST 1 VIEW COMPARISON:  08/03/2020 FINDINGS: Endotracheal tube terminates 3 cm above the carina. Weighted feeding tube  terminates in the gastric cardia. Lungs are clear. No pleural effusion or pneumothorax. Mild eventration of the right hemidiaphragm. Cardiomegaly. IMPRESSION: Endotracheal tube terminates 3 cm above the carina. Weighted feeding tube terminates in the gastric cardia. Electronically Signed   By: Charline BillsSriyesh  Krishnan M.D.   On: 08/05/2020 09:53   DG CHEST PORT 1 VIEW  Result Date: 08/03/2020 CLINICAL DATA:  Fevers. EXAM: PORTABLE CHEST 1 VIEW COMPARISON:  08/01/2020 FINDINGS: ETT tip is 3.4 cm above the carina. Feeding tube is noted with tip looped in the stomach. Mild cardiac enlargement. There is no pleural effusion or edema. No airspace opacities. IMPRESSION: 1. Stable position of support apparatus. 2. Mild cardiac enlargement. Electronically Signed   By: Signa Kellaylor  Stroud M.D.   On: 08/03/2020 10:29   DG CHEST PORT 1 VIEW  Result Date: 08/01/2020 CLINICAL DATA:  Cardiomegaly, CHF EXAM: PORTABLE CHEST 1 VIEW COMPARISON:  07/27/2020 chest radiograph. FINDINGS: Endotracheal tube tip is 3.3 cm above the carina. Weighted enteric tube loops in the proximal stomach with the tip overlying the gastric cardia/fundus. Stable cardiomediastinal silhouette with mild to moderate cardiomegaly. No pneumothorax. No pleural effusion. No overt pulmonary edema. No acute consolidative airspace disease. IMPRESSION: Well-positioned support structures. Stable mild-to-moderate cardiomegaly without overt pulmonary edema. Electronically Signed   By: Delbert PhenixJason A Poff M.D.   On: 08/01/2020 08:57   DG Chest Port 1 View  Result Date: 07/27/2020 CLINICAL DATA:  History of stroke and hypertension EXAM: PORTABLE CHEST 1 VIEW COMPARISON:  07/26/2020 FINDINGS: Endotracheal and enteric tubes are unchanged in position. Shallow inspiration. Cardiac enlargement. No vascular congestion or edema. Lungs are clear with improvement  since prior study. No effusions. Surgical clips in the right upper quadrant. IMPRESSION: Cardiac enlargement. No evidence of active  pulmonary disease. Improvement since prior study. Electronically Signed   By: Burman Nieves M.D.   On: 07/27/2020 06:35   DG CHEST PORT 1 VIEW  Result Date: 07/26/2020 CLINICAL DATA:  Inhibition EXAM: PORTABLE CHEST 1 VIEW COMPARISON:  Chest x-ray dated 06/29/2020. FINDINGS: Endotracheal tube appears well positioned with tip approximately 3 cm above the carina. Enteric tube passes below the diaphragm. Cardiomegaly. Central pulmonary vascular congestion. No pleural effusion or pneumothorax is seen. IMPRESSION: 1. Endotracheal tube appears well positioned with tip approximately 3 cm above the carina. 2. Cardiomegaly with central pulmonary vascular congestion indicating CHF/volume overload. Electronically Signed   By: Bary Richard M.D.   On: 07/26/2020 16:58   DG Abd Portable 1V  Result Date: 07/29/2020 CLINICAL DATA:  Feeding into placement EXAM: PORTABLE ABDOMEN - 1 VIEW COMPARISON:  July 26, 2020 FINDINGS: Nasogastric tube removed. Feeding tube present with tip in the stomach. Visualized bowel unremarkable. No free air evident. Visualized lungs clear. Endotracheal tube tip is 4.0 cm above the carina. IMPRESSION: Feeding tube tip in stomach. Visualized bowel gas pattern unremarkable. Visualized lungs clear. Electronically Signed   By: Bretta Bang III M.D.   On: 07/29/2020 12:02   DG Abd Portable 1V  Result Date: 07/26/2020 CLINICAL DATA:  OG tube placement EXAM: PORTABLE ABDOMEN - 1 VIEW COMPARISON:  None. FINDINGS: Enteric tube appears well positioned in the stomach. Visualized bowel gas pattern is nonobstructive. Cholecystectomy clips in the RIGHT upper quadrant. IMPRESSION: Enteric tube appears well positioned in the stomach. Electronically Signed   By: Bary Richard M.D.   On: 07/26/2020 16:57   IR PERCUTANEOUS ART THROMBECTOMY/INFUSION INTRACRANIAL INC DIAG ANGIO  Result Date: 07/28/2020 INDICATION: Acute onset of left-sided hemiplegia, right gaze deviation and slurred speech. Occluded  right internal carotid artery supraclinoid segment, right middle cerebral artery and the right anterior cerebral artery. EXAM: 1. EMERGENT LARGE VESSEL OCCLUSION THROMBOLYSIS (anterior CIRCULATION) COMPARISON:  CT angiogram of the head and neck of July 26, 2020. MEDICATIONS: Ancef 2 g IV antibiotic was administered within 1 hour of the procedure. ANESTHESIA/SEDATION: General anesthesia CONTRAST:  Isovue 300 approximately 100 mL FLUOROSCOPY TIME:  Fluoroscopy Time: 38 minutes 36 seconds (1410 mGy). COMPLICATIONS: None immediate. TECHNIQUE: Following a full explanation of the procedure along with the potential associated complications, an informed witnessed consent was obtained the patient's spouse via an interpreter. The risks of intracranial hemorrhage of 10%, worsening neurological deficit, ventilator dependency, death and inability to revascularize were all reviewed in detail with the patient's spouse. The patient was then put under general anesthesia by the Department of Anesthesiology at [facility]. The right groin was prepped and draped in the usual sterile fashion. Thereafter using modified Seldinger technique, transfemoral access into the right common femoral artery was obtained without difficulty. Over a 0.035 inch guidewire a 5 French Pinnacle sheath was inserted. Through this, and also over a 0.035 inch guidewire a 8 French 25 cm catheter was advanced to the aortic arch region and selectively positioned in the innominate artery and right common carotid artery. FINDINGS: Innominate arteriogram demonstrates the origin of the right subclavian artery and the right common carotid artery to be widely patent. Moderate tortuosity is seen of the right common carotid artery proximally. The right vertebral artery origin is widely patent. The vessel is seen to opacify to the cranial skull base. The opacified portion of the basilar artery  on the AP projection demonstrates patency with suggestion of a 50%  stenosis of the right vertebrobasilar junction. The right common carotid arteriogram demonstrates the right external carotid artery and its major branches to be widely patent. The right internal carotid artery at the bulb to the cranial skull base is patent with moderate severe tortuosity involving the mid right M1 segment associated with a 50% stenosis. The petrous and proximal cavernous segments are widely patent. There is complete angiographic occlusion of the right internal carotid artery just distal to the origin of the right posterior communicating artery supplying the right posterior cerebral artery distribution. PROCEDURE: The diagnostic JB 1 catheter in the right common carotid artery was exchanged over a 0.035 inch 300 cm Rosen exchange guidewire for an 087 95 cm balloon guide catheter which had been prepped with 50% contrast and 50% heparinized saline infusion. Guidewire was removed. Good aspiration obtained from the hub of the balloon guide catheter. A gentle contrast injection demonstrated no evidence of vasospasm, or of intraluminal filling defects. Over a 0.014 inch standard Synchro micro guidewire, an 027 160 cm Marksman microcatheter inside of an 071 135 cm Zoom aspiration catheter was advanced without difficulty to the distal cavernous segment of the right internal carotid artery. The micro guidewire was then gently manipulated without difficulty and advanced into the inferior division of the right middle cerebral artery M2 M3 region followed by the microcatheter. The guidewire was removed. Mild resistance was again to aspiration which cleared with more proximal position of the microcatheter. A gentle control arteriogram performed through the microcatheter demonstrated safe position of tip of the microcatheter, which was connected to continuous heparinized saline infusion. A 3 mm x 20 mm Solitaire X retrieval device was then advanced to the distal end of the microcatheter. The O ring on the  delivery microcatheter was loosened. With slight forward gentle traction with the right hand on the delivery micro guidewire with the left hand the delivery microcatheter was retrieved unsheathing the retrieval device. The 071 aspiration catheter was engaged in the occluded right middle cerebral artery M1 segment. Continued aspiration was applied using an aspiration device with proximal flow arrest in the right internal carotid artery, and aspiration using a 60 mL syringe at the hub of the balloon guide catheter for approximately 2-1/2 minutes. Thereafter, the combination of the retrieval device, the microcatheter, aspiration catheter was retrieved and removed as aspiration was applied at the hub of the balloon guide catheter. Large clot and small pieces were seen from within the aspiration catheter. A control arteriogram performed through the balloon guide following reversal of flow arrest in the right internal carotid artery demonstrated wide patency of the superior and inferior divisions of the right middle cerebral artery. A control arteriogram performed following infusion of 25 mcg of nitroglycerin intra-arterially now demonstrated significantly improved caliber and flow through the superior and inferior divisions. There continued be slow flow involving the M3 M4 branches of the anterior of peri insular branches consistent with the site of the core seen on the CT perfusion studies. A TICI 2C revascularization of the right MCA distribution had been achieved. The right anterior cerebral artery was now also widely patent with a TICI 3 revascularization. Patency of the right posterior communicating artery and the right posterior cerebral artery territory was intact. Control arteriogram performed through the balloon guide in the right common carotid artery demonstrated free flow in the right internal carotid artery extra cranially and intracranially. This was then removed. The 8 [place] Pinnacle sheath  in the right  groin was replaced with an 8 French Angio-Seal closure device for hemostasis. The distal pulses remained Dopplerable in both the DPs and posterior tibial arteries bilaterally unchanged. Post procedure CT scan of the brain demonstrated contrast blush in the right basal ganglia region. No gross mass effect or midline shift was noted. The patient was left intubated on account of the patient's neurologic condition prior to intubation. She was then transported to the neuro ICU for post thrombectomy care. IMPRESSION: Status post endovascular revascularization of occluded right internal carotid artery supraclinoid segment, the right middle cerebral and the right anterior cerebral artery with 1 pass with a 3 mm x 20 mm Solitaire X retrieval device, contact aspiration and proximal flow arrest achieving a TICI 2C revascularization. PLAN: Follow-up as per referring MD. Electronically Signed   By: Julieanne Cotton M.D.   On: 07/27/2020 14:02   CT HEAD CODE STROKE WO CONTRAST  Result Date: 07/26/2020 CLINICAL DATA:  Code stroke. Left-sided weakness. Right gaze disturbance. EXAM: CT HEAD WITHOUT CONTRAST TECHNIQUE: Contiguous axial images were obtained from the base of the skull through the vertex without intravenous contrast. COMPARISON:  07/09/2018 FINDINGS: Brain: Age related volume loss. Chronic small-vessel ischemic changes of the brainstem, cerebellum and cerebral hemispheres. Physiologic basal ganglia calcification. No visible acute infarction, mass lesion, hemorrhage, hydrocephalus or extra-axial collection. Vascular: There is atherosclerotic calcification of the major vessels at the base of the brain. Skull: Negative Sinuses/Orbits: Clear/normal Other: None ASPECTS ([place] Stroke Program Early CT Score) - Ganglionic level infarction (caudate, lentiform nuclei, internal capsule, insula, M1-M3 cortex): 7 - Supraganglionic infarction (M4-M6 cortex): 3 Total score (0-10 with 10 being normal): 10 IMPRESSION: 1. No acute  CT finding. Chronic small-vessel ischemic changes throughout the brain. Physiologic calcification of basal ganglia. 2. ASPECTS is 10 3. Discussed with Dr. Otelia Limes during interpretation at 12 17 hours. Electronically Signed   By: Paulina Fusi M.D.   On: 07/26/2020 12:22   VAS [place] UPPER EXTREMITY VENOUS DUPLEX  Result Date: 08/07/2020 UPPER VENOUS STUDY  Indications: Edema Risk Factors: None identified. Limitations: Body habitus, poor ultrasound/tissue interface and patient positioning, patient movement. Comparison Study: No prior studies. Performing Technologist: Chanda Busing RVT  Examination Guidelines: A complete evaluation includes B-mode imaging, spectral Doppler, color Doppler, and power Doppler as needed of all accessible portions of each vessel. Bilateral testing is considered an integral part of a complete examination. Limited examinations for reoccurring indications may be performed as noted.  Right Findings: +----------+------------+---------+-----------+----------+-------+ RIGHT     CompressiblePhasicitySpontaneousPropertiesSummary +----------+------------+---------+-----------+----------+-------+ Subclavian    Full       Yes       Yes                      +----------+------------+---------+-----------+----------+-------+  Left Findings: +----------+------------+---------+-----------+----------+-------+ LEFT      CompressiblePhasicitySpontaneousPropertiesSummary +----------+------------+---------+-----------+----------+-------+ IJV           Full       Yes       Yes                      +----------+------------+---------+-----------+----------+-------+ Subclavian    Full       Yes       Yes                      +----------+------------+---------+-----------+----------+-------+ Axillary      Full       Yes       Yes                      +----------+------------+---------+-----------+----------+-------+  Brachial      Full       Yes       Yes                       +----------+------------+---------+-----------+----------+-------+ Radial        Full                                          +----------+------------+---------+-----------+----------+-------+ Ulnar         Full                                          +----------+------------+---------+-----------+----------+-------+ Cephalic      None                                   Acute  +----------+------------+---------+-----------+----------+-------+ Basilic       Full                                          +----------+------------+---------+-----------+----------+-------+  Summary:  Right: No evidence of thrombosis in the subclavian.  Left: No evidence of deep vein thrombosis in the upper extremity. Findings consistent with acute superficial vein thrombosis involving the left cephalic vein.  *See table(s) above for measurements and observations.  Diagnosing physician: Lemar Livings MD Electronically signed by Lemar Livings MD on 08/07/2020 at 8:17:46 AM.    Final    CT ANGIO HEAD CODE STROKE  Result Date: 07/26/2020 CLINICAL DATA:  Acute presentation with left body weakness and speech disturbance. EXAM: CT ANGIOGRAPHY HEAD AND NECK CT PERFUSION BRAIN TECHNIQUE: Multidetector CT imaging of the head and neck was performed using the standard protocol during bolus administration of intravenous contrast. Multiplanar CT image reconstructions and MIPs were obtained to evaluate the vascular anatomy. Carotid stenosis measurements (when applicable) are obtained utilizing NASCET criteria, using the distal internal carotid diameter as the denominator. Multiphase CT imaging of the brain was performed following IV bolus contrast injection. Subsequent parametric perfusion maps were calculated using RAPID software. CONTRAST:  Not annotated COMPARISON:  Head CT earlier same day FINDINGS: CTA NECK FINDINGS Aortic arch: No aortic atherosclerotic calcification seen. No aneurysm or dissection. Branching  pattern is normal with wide patency of the origins. Right carotid system: Common carotid artery widely patent to the bifurcation. No atherosclerotic disease at the carotid bifurcation. ICA widely patent. Left carotid system: Common carotid artery widely patent to the bifurcation. Carotid bifurcation is normal without soft or calcified plaque. ICA widely patent. Vertebral arteries: Both vertebral artery origins are widely patent. Both vertebral arteries are normal through the cervical region to the foramen magnum. Skeleton: Ordinary cervical spondylosis. Other neck: No mass or lymphadenopathy. Upper chest: No active chest disease. 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 Review of the MIP images confirms the above findings CTA HEAD FINDINGS Anterior circulation: Complete occlusion of the right carotid terminus. Minimal flow visible in the right MCA territory. Right ACA receives it supply through the anterior communicating route. No left stenosis or occlusion. Right PCA arises  from the carotid artery immediately proximal to the occlusion. Posterior circulation: Both vertebral arteries patent to the basilar. Atherosclerotic narrowing the distal vertebral arteries and of the basilar. Posterior circulation branch vessels are patent. Right PCA receives most of it supply from the anterior circulation. Venous sinuses: Patent and normal. Anatomic variants: None significant. Review of the MIP images confirms the above findings CT Brain Perfusion Findings: ASPECTS: 10 CBF (<30%) Volume: 60mL Perfusion (Tmax>6.0s) volume: Mismatch Volume: Infarction Location:Right frontal operculum IMPRESSION: Embolic occlusion at the right carotid terminus. Minimal flow seen in the right MCA territory. Right ACA territory appears normally perfused, probably due to a patent anterior communicating artery. 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 Large  penumbra, 136 cc in the remainder of the right MCA territory. No aortic atherosclerotic disease seen. No atherosclerotic disease at either carotid bifurcation. Some atherosclerotic narrowing of the distal vertebral arteries and basilar artery. Preliminary report sent by text at 1238 hours. Discussed with stroke PA 1242 hours. Electronically Signed   By: Paulina Fusi M.D.   On: 07/26/2020 12:48   CT ANGIO NECK CODE STROKE  Result Date: 07/26/2020 CLINICAL DATA:  Acute presentation with left body weakness and speech disturbance. EXAM: CT ANGIOGRAPHY HEAD AND NECK CT PERFUSION BRAIN TECHNIQUE: Multidetector CT imaging of the head and neck was performed using the standard protocol during bolus administration of intravenous contrast. Multiplanar CT image reconstructions and MIPs were obtained to evaluate the vascular anatomy. Carotid stenosis measurements (when applicable) are obtained utilizing NASCET criteria, using the distal internal carotid diameter as the denominator. Multiphase CT imaging of the brain was performed following IV bolus contrast injection. Subsequent parametric perfusion maps were calculated using RAPID software. CONTRAST:  Not annotated COMPARISON:  Head CT earlier same day FINDINGS: CTA NECK FINDINGS Aortic arch: No aortic atherosclerotic calcification seen. No aneurysm or dissection. Branching pattern is normal with wide patency of the origins. Right carotid system: Common carotid artery widely patent to the bifurcation. No atherosclerotic disease at the carotid bifurcation. ICA widely patent. Left carotid system: Common carotid artery widely patent to the bifurcation. Carotid bifurcation is normal without soft or calcified plaque. ICA widely patent. Vertebral arteries: Both vertebral artery origins are widely patent. Both vertebral arteries are normal through the cervical region to the foramen magnum. Skeleton: Ordinary cervical spondylosis. Other neck: No mass or lymphadenopathy. Upper chest:  No active chest disease. 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 Review of the MIP images confirms the above findings CTA HEAD FINDINGS Anterior circulation: Complete occlusion of the right carotid terminus. Minimal flow visible in the right MCA territory. Right ACA receives it supply through the anterior communicating route. No left stenosis or occlusion. Right PCA arises from the carotid artery immediately proximal to the occlusion. Posterior circulation: Both vertebral arteries patent to the basilar. Atherosclerotic narrowing the distal vertebral arteries and of the basilar. Posterior circulation branch vessels are patent. Right PCA receives most of it supply from the anterior circulation. Venous sinuses: Patent and normal. Anatomic variants: None significant. 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 Minimal flow seen in the right MCA territory. Right ACA territory appears normally perfused, probably due to a patent anterior communicating artery. 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  Large penumbra, 136 cc in the remainder of the right MCA territory. No aortic atherosclerotic disease seen. No atherosclerotic disease at either carotid bifurcation. Some atherosclerotic narrowing of the distal vertebral arteries and basilar artery. Preliminary report sent by text at 1238 hours. Discussed with stroke PA 1242 hours. Electronically Signed   By: Paulina Fusi M.D.   On: 07/26/2020 12:48   IR ANGIO VERTEBRAL SEL SUBCLAVIAN INNOMINATE UNI R MOD SED  Result Date: 07/29/2020 INDICATION: Acute onset of left-sided hemiplegia, right gaze deviation and slurred speech.  Occluded right internal carotid artery  supraclinoid segment, right middle cerebral artery and the right anterior cerebral artery.  EXAM: 1. EMERGENT LARGE VESSEL OCCLUSION THROMBOLYSIS (anterior CIRCULATION)  COMPARISON:  CT angiogram of the head and neck of July 26, 2020.  MEDICATIONS: Ancef 2 g IV antibiotic was administered within 1 hour of the procedure.  ANESTHESIA/SEDATION: General anesthesia  CONTRAST:  Isovue 300 approximately 100 mL  FLUOROSCOPY TIME:  Fluoroscopy Time: 38 minutes 36 seconds (1410 mGy).  COMPLICATIONS: None immediate.  TECHNIQUE: Following a full explanation of the procedure along with the potential associated complications, an informed witnessed consent was obtained the patient's spouse via an interpreter. The risks of intracranial hemorrhage of 10%, worsening neurological deficit, ventilator dependency, death and inability to revascularize were all reviewed in detail with the patient's spouse.  The patient was then put under general anesthesia by the Department of Anesthesiology at [facility].  The right groin was prepped and draped in the usual sterile fashion. Thereafter using modified Seldinger technique, transfemoral access into the right common femoral artery was obtained without difficulty. Over a 0.035 inch guidewire a 5 French Pinnacle sheath was inserted. Through this, and also over a 0.035 inch guidewire a 8 French 25 cm catheter was advanced to the aortic arch region and selectively positioned in the innominate artery and right common carotid artery.  FINDINGS: Innominate arteriogram demonstrates the origin of the right subclavian artery and the right common carotid artery to be widely patent.  Moderate tortuosity is seen of the right common carotid artery proximally.  The right vertebral artery origin is widely patent.  The vessel is seen to opacify to the cranial skull base. The opacified portion of the basilar artery on the AP projection demonstrates patency with suggestion of a 50%  stenosis of the right vertebrobasilar junction.  The right common carotid arteriogram demonstrates the right external carotid artery and its major branches to be widely patent.  The right internal carotid artery at the bulb to the cranial skull base is patent with moderate severe tortuosity involving the mid right M1 segment associated with a 50% stenosis.  The petrous and proximal cavernous segments are widely patent.  There is complete angiographic occlusion of the right internal carotid artery just distal to the origin of the right posterior communicating artery supplying the right posterior cerebral artery distribution.  PROCEDURE: The diagnostic JB 1 catheter in the right common carotid artery was exchanged over a 0.035 inch 300 cm Rosen exchange guidewire for an 087 95 cm balloon guide catheter which had been prepped with 50% contrast and 50% heparinized saline infusion.  Guidewire was removed. Good aspiration obtained from the hub of the balloon guide catheter. A gentle contrast injection demonstrated no evidence of vasospasm, or of intraluminal filling defects.  Over a 0.014 inch standard Synchro micro guidewire, an 027 160 cm Marksman microcatheter inside of an 071 135 cm Zoom aspiration catheter was advanced without difficulty to the distal cavernous  segment of the right internal carotid artery.  The micro guidewire was then gently manipulated without difficulty and advanced into the inferior division of the right middle cerebral artery M2 M3 region followed by the microcatheter. The guidewire was removed. Mild resistance was again to aspiration which cleared with more proximal position of the microcatheter. A gentle control arteriogram performed through the microcatheter demonstrated safe position of tip of the microcatheter, which was connected to continuous heparinized saline infusion.  A 3 mm x 20 mm Solitaire X retrieval device was then advanced to the distal end of the microcatheter. The O  ring on the delivery microcatheter was loosened. With slight forward gentle traction with the right hand on the delivery micro guidewire with the left hand the delivery microcatheter was retrieved unsheathing the retrieval device.  The 071 aspiration catheter was engaged in the occluded right middle cerebral artery M1 segment. Continued aspiration was applied using an aspiration device with proximal flow arrest in the right internal carotid artery, and aspiration using a 60 mL syringe at the hub of the balloon guide catheter for approximately 2-1/2 minutes.  Thereafter, the combination of the retrieval device, the microcatheter, aspiration catheter was retrieved and removed as aspiration was applied at the hub of the balloon guide catheter. Large clot and small pieces were seen from within the aspiration catheter.  A control arteriogram performed through the balloon guide following reversal of flow arrest in the right internal carotid artery demonstrated wide patency of the superior and inferior divisions of the right middle cerebral artery. A control arteriogram performed following infusion of 25 mcg of nitroglycerin intra-arterially now demonstrated significantly improved caliber and flow through the superior and inferior divisions. There continued be slow flow involving the M3 M4 branches of the anterior of peri insular branches consistent with the site of the core seen on the CT perfusion studies.  A TICI 2C revascularization of the right MCA distribution had been achieved. The right anterior cerebral artery was now also widely patent with a TICI 3 revascularization.  Patency of the right posterior communicating artery and the right posterior cerebral artery territory was intact.  Control arteriogram performed through the balloon guide in the right common carotid artery demonstrated free flow in the right internal carotid artery extra cranially and intracranially.  This was then removed. The 8 French  Pinnacle sheath in the right groin was replaced with an 8 French Angio-Seal closure device for hemostasis. The distal pulses remained Dopplerable in both the DPs and posterior tibial arteries bilaterally unchanged.  Post procedure CT scan of the brain demonstrated contrast blush in the right basal ganglia region. No gross mass effect or midline shift was noted.  The patient was left intubated on account of the patient's neurologic condition prior to intubation. She was then transported to the neuro ICU for post thrombectomy care.  IMPRESSION: Status post endovascular revascularization of occluded right internal carotid artery supraclinoid segment, the right middle cerebral and the right anterior cerebral artery with 1 pass with a 3 mm x 20 mm Solitaire X retrieval device, contact aspiration and proximal flow arrest achieving a TICI 2C revascularization.  PLAN: Follow-up as per referring MD.   Electronically Signed   By: Julieanne Cotton M.D.   On: 07/27/2020 14:02    Time Spent in minutes  30     Laverna Peace M.D on 08/16/2020 at 12:50 PM  To page go to [email or web address] - password [facility]

## 2020-08-16 NOTE — Progress Notes (Signed)
Pharmacy Antibiotic Note  Samantha Paul is a 74 y.o. female admitted on 07/26/2020 with embolic occlusion of R-carotid terminus s/p TPA and IR revascularization.  Now with new fever up to 102.5, significantly worse leukocytosis, and tachycardia concerning for sepsis of unknown cause. CXR was negative for pneumonia. UA with hematuria. Blood and urine cultures negative.  Found to have C.diff and started on PO Vanc/IV Flagyl.  Continues on Cefepime given CT concerning for pyelo..  Plan: Continue Cefepime 2g IV every 12 hours.  Utilize shortest course of abx possible for treatment of pyelo given concurrent Cdiff infection. OK to d/c Vancomycin IV per MD. Monitor serum creatinine, clinical status, and culture results  Height: 5\' 2"  (157.5 cm) Weight: 72.6 kg (160 lb 0.9 oz) IBW/kg (Calculated) : 50.1  Temp (24hrs), Avg:98.1 F (36.7 C), Min:96.9 F (36.1 C), Max:98.4 F (36.9 C)  Recent Labs  Lab 08/12/20 0151 08/12/20 0151 08/13/20 1352 08/13/20 1634 08/14/20 1321 08/15/20 0302 08/16/20 0203  WBC 20.1*   < > 49.9* 49.9* 35.5* 21.7* 12.8*  CREATININE 1.17*  --  1.37*  --  1.31* 1.28* 1.30*  LATICACIDVEN  --   --   --  1.5  --   --   --    < > = values in this interval not displayed.    Estimated Creatinine Clearance: 36.5 mL/min (A) (by C-G formula based on SCr of 1.3 mg/dL (H)).    Allergies  Allergen Reactions   Metformin And Related Diarrhea    Abdominal pain and diarrhea    Antimicrobials this admission: Zosyn 8/7>>8/9 CTX 8/9>> 8/15 Vanc 8/19 >> 8/22 PO Vanc 8/20 >> Flagyl IV 8/20 >> Cefepime 8/19 >>  Dose adjustments this admission:   Microbiology results: 8/1 MRSA PCR negative 8/1 COVID negative 8/8 TA - rare Kleb pneumo (pan-S except R-amp) 8/7 BCx - negative 8/9 UCx - negative */19 UCx - negative 8/19 BCx x 2 - ngtd 8/20 Cdiff - positive  Thank you for allowing pharmacy to be a part of this patients care.  9/20, Pharm.D., BCPS Clinical  Pharmacist Clinical phone for 08/16/2020 from 8:30-4:00 is [phone].  **Pharmacist phone directory can be found on [email or web address] listed under [facility].  08/16/2020 1:02 PM

## 2020-08-16 NOTE — Progress Notes (Signed)
STROKE TEAM PROGRESS NOTE   INTERVAL HISTORY Patient husband and RN are at bedside. Patient neuro unchanged. No fever, leukocytosis continues to improved, and Hb stable. On TF. Had one episode of hypoglycemia, was given D 50.   Vitals:   08/16/20 1300 08/16/20 1346 08/16/20 1540 08/16/20 1545  BP:  116/76 (!) 98/32 111/72  Pulse:  71 70 76  Resp: (!) 22 20 17 17   Temp:  98 F (36.7 C) 97.8 F (36.6 C) 97.8 F (36.6 C)  TempSrc:  Axillary Axillary Axillary  SpO2:  100% 100% 100%  Weight:      Height:       CBC:  Recent Labs  Lab 08/12/20 0151 08/13/20 1352 08/13/20 1634 08/13/20 2200 08/15/20 0302 08/16/20 0203  WBC 20.1*   < > 49.9*   < > 21.7* 12.8*  NEUTROABS 16.0*  --  44.4*  --   --   --   HGB 7.7*   < > 6.8*   < > 7.3* 7.3*  HCT 26.3*   < > 23.5*   < > 24.2* 24.6*  MCV 72.9*   < > 73.9*   < > 75.6* 75.9*  PLT 471*   < > 399   < > 238 306   < > = values in this interval not displayed.   Basic Metabolic Panel:  Recent Labs  Lab 08/15/20 0302 08/16/20 0203  NA 131* 133*  K 4.5 4.3  CL 102 104  CO2 21* 22  GLUCOSE 146* 140*  BUN 31* 31*  CREATININE 1.28* 1.30*  CALCIUM 8.1* 8.3*   IMAGING past 24 hours DG CHEST PORT 1 VIEW  Result Date: 08/16/2020 CLINICAL DATA:  74 year old female with history of stroke. EXAM: PORTABLE CHEST 1 VIEW COMPARISON:  Chest x-ray 08/13/2020. FINDINGS: Feeding tube extending into the stomach with tip coiled back in the proximal stomach. Lung volumes are normal. No consolidative airspace disease. No pleural effusions. No evidence of pulmonary edema. No pneumothorax. No suspicious appearing pulmonary nodules or masses are noted. Mild cardiomegaly. Upper mediastinal contours are within normal limits allowing for patient positioning. Surgical clips project over the right upper quadrant of the abdomen, likely from prior cholecystectomy. IMPRESSION: 1. Tip of feeding tube is in the proximal stomach. 2. No radiographic evidence of acute  cardiopulmonary disease. 3. Mild cardiomegaly. Electronically Signed   By: 08/15/2020 M.D.   On: 08/16/2020 10:09    PHYSICAL EXAM    General - Well nourished, well developed elderly Asian lady, not in distress  Ophthalmologic - fundi not visualized due to noncooperation.  Cardiovascular - irregularly irregular heart rate and rhythm.  Neuro -  Eyes initially closed, however easily open with voice and maintained opening. Nonverbal, not following commands.  Eyes in right gaze position, not able to cross midline. Not blinking to visual threat on the left but blinking on the right, PERRL. Corneal reflex present, gag and cough present. Left lower facial weakness. Tongue protrusion not cooperative. Cortrak in place. On pain stimulation, active movement of RUE and RLE with RUE at least 4/5 and RLE at least 3-/5. LUE 0/5 and LLE 2-/5 withdraw on pain. DTR 1+ and bilateral positive babinski. Sensation not cooperative but brisk response to painful stimulation, coordination not cooperative and gait not tested.   ASSESSMENT/PLAN [name] is a 74 y.o. female with history of atrial fibrillation, HTN, DM and stroke presenting with right gaze deviation, left-sided hemiplegia and dense receptive and expressive aphasia. She received tPA 07/26/2020  at 1259. Taken to IR for R ICA T occlusion.  Stroke:  R MCA and ACA infarct due to R tICA occlusion s/p tPA and IR w/ TICI2c-3, embolic secondary to known AF but not on AC for at least a week  Code Stroke CT head No acute abnormality.     CTA head & neck R tICA occlusion w/ min MCA flow. Some narrowing B VA and BA.   CT perfusion R frontal core 16 cc with penumbra 136 R MCA territory  Cerebral angio RT ICA T occlusion ->TICI 2C revascularization of RT MCA and a TICI 3 revascularization of RT ACA .  CT head post IR - new R frontal lobe hyper and hypointensity (infarct, edema, petechial hemorrhage)  MRI right MCA and ACA multifocal large infarcts with  petechial hemo  CT repeat stable right MCA multifocal infarcts, minimal MLS.  2D Echo 07/03/2020 EF 65-70%. No source of embolus   LDL 90  HgbA1c 12.7  VTE prophylaxis - SCDs   Eliquis (apixaban) daily prior to admission, now on aspirin 81 mg. Hold AC for now given severe anemia needing PRBC, and planned PEG procedure.  Therapy recommendations: SNF  Disposition:  Pending    Palliative Care on board also but husband wants to continue full code  Sepsis likely due to C. difficile colitis w/ diarrhea  Code sepsis 8/19   WBC 20.2->22.3->21.3->20.1->49.9->35.5->21.7->12.8  TM 102.5->afebrile  Lactic acid 1.5  C. diff Positive   Blood Cx 8/19 NGTD  UA > 50 RBC, 30 Protein. No WBC or bacteria  Urine Cx - 8/19 - no growth  CT chest, abd, pelvis - 08/14/20 - concerning for acute pyelonephritis   Rectal tube remains in place  Flagyl 8/19>>  Cefepime 8/19>>  Vancomycin 8/19  Microcytic anemia   Hgb 8.1->7.5->7.7->6.6 - 6.8 - PRBC - 8.1->7.7->7.3->7.3  FOB positive  Fe 15, Sat 5, Fer 634  Rectal tube remains in place  On iron pills  Atrial Fibrillation w/ RVR  Home anticoagulation:  Eliquis (apixaban) daily - not on for at least a week prior to admission   On diltiazem 240, metoprolol 12.5 bid  PTA  On diltiazem 90mg  Q6  On metoprolol 50 bid  On ASA 81  No AC for now given severe anemia requiring transfusion and planned PEG procedure.   Hypotension  Home meds:  None, no hx HTN . BP stable now . On metoprolol  50 bid . Long-term BP goal normotensive  Hyperlipidemia  Home meds:  crestor 20, resumed in hospital  LDL 90, at goal < 70  Continue statin at discharge  Diabetes type II Uncontrolled Hypoglycemia episode 8/22  Home meds:  lantus 20 daily, metformin 500 bid  HgbA1c 12.7, goal < 7.0  CBGs  SSI  DM coordinator assistance appreciated  On lantus 22, novolog 5U Q4h  Hypoglycemia during night 8/22 Glucose 68 -> D50 ->  154  Dysphagia . Secondary to stroke . NPO . Has cortrak . On TF @50  . Trauma consulted for PEG, on hold until next week d/t sepsis . Speech on board   AKI  Creatinine 1.37->1.31->1.28->1.30  On TF @ 50 + Pro Source 45 ml daily  Off IVF  Hyponatremia, improving  Na - 132->131->133  Hyperglycemia resolved  Close monitoring  Hx of stroke  06/2018 left pontine infarct likely secondary to small vessel disease source. CTA head and neck neg. EF 65-70%. LDL 140 and A1C 12.3. DAPT x 3 weeks and lipitor 40.  Other Stroke Risk  Factors  Advanced age  Other Active Problems  Urinary retention    Hospital day # 58  I had long discussion with husband at bedside, updated pt current condition, treatment plan and potential prognosis, and answered all the questions.  He expressed understanding and appreciation.    Marvel Plan, MD PhD Stroke Neurology 08/16/2020 5:53 PM  To contact Stroke Continuity provider, please refer to [email or web address]. After hours, contact General Neurology

## 2020-08-16 NOTE — Progress Notes (Addendum)
CRITICAL VALUE ALERT  Critical Value:  CGB 68  Date & Time Notied:  08/16/20 1532  Provider Notified: 08/16/20 1534  Orders Received/Actions taken: Hypoglycemia protocol followed, 1/2 amp D50 IV given, rechecked in 15 min CBG 154.

## 2020-08-16 NOTE — Progress Notes (Signed)
   08/16/20 0900  Vitals  Temp 98.1 F (36.7 C)  Temp Source Axillary  BP 121/89  MAP (mmHg) 95  BP Location Left Leg  BP Method Automatic  Patient Position (if appropriate) Lying  Pulse Rate 97  Pulse Rate Source Monitor  ECG Heart Rate 98  Resp (!) 23  Level of Consciousness  Level of Consciousness Alert  MEWS COLOR  MEWS Score Color Green  Oxygen Therapy  SpO2 100 %  O2 Device Nasal Cannula  O2 Flow Rate (L/min) 3.5 L/min  Patient Activity (if Appropriate) In bed  Pain Assessment  Pain Scale 0-10  Pain Score 0  MEWS Score  MEWS Temp 0  MEWS Systolic 0  MEWS Pulse 0  MEWS RR 1  MEWS LOC 0  MEWS Score 1  Reassess VS now green.  RR with WNL.

## 2020-08-17 DIAGNOSIS — N10 Acute tubulo-interstitial nephritis: Secondary | ICD-10-CM

## 2020-08-17 LAB — GLUCOSE, CAPILLARY
Glucose-Capillary: 111 mg/dL — ABNORMAL HIGH (ref 70–99)
Glucose-Capillary: 161 mg/dL — ABNORMAL HIGH (ref 70–99)
Glucose-Capillary: 121 mg/dL — ABNORMAL HIGH (ref 70–99)
Glucose-Capillary: 124 mg/dL — ABNORMAL HIGH (ref 70–99)
Glucose-Capillary: 58 mg/dL — ABNORMAL LOW (ref 70–99)
Glucose-Capillary: 75 mg/dL (ref 70–99)
Glucose-Capillary: 138 mg/dL — ABNORMAL HIGH (ref 70–99)

## 2020-08-17 LAB — BASIC METABOLIC PANEL
Anion gap: 8 (ref 5–15)
BUN: 31 mg/dL — ABNORMAL HIGH (ref 8–23)
CO2: 21 mmol/L — ABNORMAL LOW (ref 22–32)
Calcium: 8.4 mg/dL — ABNORMAL LOW (ref 8.9–10.3)
Chloride: 107 mmol/L (ref 98–111)
Creatinine, Ser: 1.31 mg/dL — ABNORMAL HIGH (ref 0.44–1.00)
GFR calc Af Amer: 47 mL/min — ABNORMAL LOW (ref >60)
GFR calc non Af Amer: 41 mL/min — ABNORMAL LOW (ref >60)
Glucose, Bld: 89 mg/dL (ref 70–99)
Potassium: 4.3 mmol/L (ref 3.5–5.1)
Sodium: 136 mmol/L (ref 135–145)

## 2020-08-17 LAB — CBC
HCT: 25.7 % — ABNORMAL LOW (ref 36.0–46.0)
Hemoglobin: 7.5 g/dL — ABNORMAL LOW (ref 12.0–15.0)
MCH: 22.3 pg — ABNORMAL LOW (ref 26.0–34.0)
MCHC: 29.2 g/dL — ABNORMAL LOW (ref 30.0–36.0)
MCV: 76.5 fL — ABNORMAL LOW (ref 80.0–100.0)
Platelets: 247 10*3/uL (ref 150–400)
RBC: 3.36 MIL/uL — ABNORMAL LOW (ref 3.87–5.11)
RDW: 22.2 % — ABNORMAL HIGH (ref 11.5–15.5)
WBC: 10.1 10*3/uL (ref 4.0–10.5)
nRBC: 0 % (ref 0.0–0.2)

## 2020-08-17 MED ORDER — DEXTROSE 50 % IV SOLN: 12.5000 g | INTRAVENOUS | Status: AC

## 2020-08-17 MED ORDER — FERROUS SULFATE 300 (60 FE) MG/5ML PO SYRP
300.0000 mg | ORAL_SOLUTION | Freq: Three times a day (TID) | ORAL | Status: DC
  Administered 2020-08-17 – 2020-08-23 (×19): 300 mg
  Filled 2020-08-17 (×19): qty 5

## 2020-08-17 MED ORDER — INSULIN ASPART 100 UNIT/ML ~~LOC~~ SOLN
0.0000 [IU] | SUBCUTANEOUS | Status: DC
  Administered 2020-08-18: 2 [IU] via SUBCUTANEOUS
  Administered 2020-08-18 (×2): 1 [IU] via SUBCUTANEOUS
  Administered 2020-08-19 (×6): 2 [IU] via SUBCUTANEOUS
  Administered 2020-08-20: 1 [IU] via SUBCUTANEOUS
  Administered 2020-08-20: 2 [IU] via SUBCUTANEOUS
  Administered 2020-08-20: 1 [IU] via SUBCUTANEOUS
  Administered 2020-08-20: 2 [IU] via SUBCUTANEOUS
  Administered 2020-08-20: 1 [IU] via SUBCUTANEOUS
  Administered 2020-08-21 (×3): 2 [IU] via SUBCUTANEOUS
  Administered 2020-08-21: 1 [IU] via SUBCUTANEOUS
  Administered 2020-08-21: 2 [IU] via SUBCUTANEOUS
  Administered 2020-08-22 (×2): 1 [IU] via SUBCUTANEOUS
  Administered 2020-08-22: 2 [IU] via SUBCUTANEOUS
  Administered 2020-08-22 (×2): 1 [IU] via SUBCUTANEOUS
  Administered 2020-08-22 – 2020-08-23 (×3): 2 [IU] via SUBCUTANEOUS
  Administered 2020-08-23: 1 [IU] via SUBCUTANEOUS

## 2020-08-17 MED ORDER — DEXTROSE 50 % IV SOLN
INTRAVENOUS | Status: AC
  Administered 2020-08-17: 12.5 g via INTRAVENOUS
  Filled 2020-08-17: qty 50

## 2020-08-17 NOTE — Progress Notes (Signed)
STROKE TEAM PROGRESS NOTE   INTERVAL HISTORY Pt lying in bed, eyes open, still right gaze and nonverbal. Left hemiplegia. Leukocytosis resolved. No fever, Cre and Hb stable. Had interpreter at bedside, I had long discussion with husband at bedside, updated pt current condition, treatment plan and potential prognosis, and answered all the questions. He expressed understanding and appreciation.     Vitals:   08/17/20 0300 08/17/20 0816 08/17/20 0855 08/17/20 1218  BP: 128/67 115/75 (!) 110/38 121/63  Pulse: 76 99 89 (!) 57  Resp: 16 16 14 16   Temp: 98.3 F (36.8 C) 97.9 F (36.6 C) 98.3 F (36.8 C) 97.8 F (36.6 C)  TempSrc:  Axillary Axillary Axillary  SpO2:  100% 100% 100%  Weight:      Height:       CBC:  Recent Labs  Lab 08/12/20 0151 08/13/20 1352 08/13/20 1634 08/13/20 2200 08/16/20 0203 08/17/20 0844  WBC 20.1*   < > 49.9*   < > 12.8* 10.1  NEUTROABS 16.0*  --  44.4*  --   --   --   HGB 7.7*   < > 6.8*   < > 7.3* 7.5*  HCT 26.3*   < > 23.5*   < > 24.6* 25.7*  MCV 72.9*   < > 73.9*   < > 75.9* 76.5*  PLT 471*   < > 399   < > 306 247   < > = values in this interval not displayed.   Basic Metabolic Panel:  Recent Labs  Lab 08/16/20 0203 08/17/20 0220  NA 133* 136  K 4.3 4.3  CL 104 107  CO2 22 21*  GLUCOSE 140* 89  BUN 31* 31*  CREATININE 1.30* 1.31*  CALCIUM 8.3* 8.4*   IMAGING past 24 hours No results found.  PHYSICAL EXAM  General - Well nourished, well developed elderly Asian lady, not in distress  Ophthalmologic - fundi not visualized due to noncooperation.  Cardiovascular - irregularly irregular heart rate and rhythm.  Neuro -  Eyes initially closed, however easily open with voice and maintained opening. Nonverbal, not following commands.  Eyes in right gaze position, not able to cross midline. Not blinking to visual threat on the left but blinking on the right, PERRL. Corneal reflex present, gag and cough present. Left lower facial weakness.  Tongue protrusion not cooperative. Cortrak in place. On pain stimulation, active movement of RUE and RLE with RUE at least 4/5 and RLE at least 3-/5. LUE 0/5 and LLE 2-/5 withdraw on pain. DTR 1+ and bilateral positive babinski. Sensation not cooperative but brisk response to painful stimulation, coordination not cooperative and gait not tested.   ASSESSMENT/PLAN Ms. Shanavia Callejo is a 74 y.o. female with history of atrial fibrillation, HTN, DM and stroke presenting with right gaze deviation, left-sided hemiplegia and dense receptive and expressive aphasia. She received tPA 07/26/2020 at 1259. Taken to IR for R ICA T occlusion.  Stroke:  R MCA and ACA infarct due to R tICA occlusion s/p tPA and IR w/ TICI2c-3, embolic secondary to known AF but not on AC for at least a week  Code Stroke CT head No acute abnormality.     CTA head & neck R tICA occlusion w/ min MCA flow. Some narrowing B VA and BA.   CT perfusion R frontal core 16 cc with penumbra 136 R MCA territory  Cerebral angio RT ICA T occlusion ->TICI 2C revascularization of RT MCA and a TICI 3 revascularization of RT ACA .  CT head post IR - new R frontal lobe hyper and hypointensity (infarct, edema, petechial hemorrhage)  MRI right MCA and ACA multifocal large infarcts with petechial hemo  CT repeat stable right MCA multifocal infarcts, minimal MLS.  2D Echo 07/03/2020 EF 65-70%. No source of embolus   LDL 90  HgbA1c 12.7  VTE prophylaxis - SCDs   Eliquis (apixaban) daily prior to admission, now on aspirin 81 mg. Will consider AC after PEG placement  Therapy recommendations: SNF  Disposition:  Pending  - TOC working with husband on facility decision  Sepsis likely due to C. difficile colitis w/ diarrhea  Code sepsis 8/19   WBC 20.2->22.3->21.3->20.1->49.9->35.5->21.7->12.8->10.1  TM 102.5->afebrile  Lactic acid 1.5  C. diff Positive   Blood Cx 8/19 NGTD  UA > 50 RBC, 30 Protein. No WBC or bacteria  Urine Cx - 8/19 -  no growth  CT chest, abd, pelvis - 08/14/20 - concerning for acute pyelonephritis   Rectal tube out 8/23  Flagyl 8/19>>  Cefepime 8/19>>  Vancomycin 8/19>>  Microcytic anemia   Hgb 8.1->7.5->7.7->6.6 - 6.8 - PRBC - 8.1->7.7->7.3->7.3->7.5  FOB positive  Fe 15, Sat 5, Fer 634  Rectal tube remains in place  On iron pills  Atrial Fibrillation w/ RVR  Home anticoagulation:  Eliquis (apixaban) daily - not on for at least a week prior to admission   On diltiazem 240, metoprolol 12.5 bid  PTA  On diltiazem 90mg  Q6  On metoprolol 50 bid  On ASA 81  Consider AC after PEG placement   Hyperlipidemia  Home meds:  crestor 20, resumed in hospital  LDL 90, at goal < 70  Continue statin at discharge  Diabetes type II Uncontrolled Hypoglycemia episode 8/22  Home meds:  lantus 20 daily, metformin 500 bid  HgbA1c 12.7, goal < 7.0  CBGs  SSI  On lantus 22, novolog 5U Q4h  Hypoglycemia during night 8/22 Glucose 68 -> D50 -> 154  Hypoglycemia 8/23 pm glucose 58 -> D 50  DM coordinator consulted again  Dysphagia . Secondary to stroke . NPO . Has cortrak . On TF @50  . Trauma plan for PEG placement in am . Speech on board   AKI  Creatinine 1.37->1.31->1.28->1.30  On TF @ 50 + Pro Source 45 ml daily  Off IVF  Hyponatremia, resolved  Na - 132->131->133->136  Close monitoring  Hx of stroke  06/2018 left pontine infarct likely secondary to small vessel disease source. CTA head and neck neg. EF 65-70%. LDL 140 and A1C 12.3. DAPT x 3 weeks and lipitor 40.  Other Stroke Risk Factors  Advanced age  Other Active Problems  Urinary retention. Possible pyelonephritis on CT abd. UA w/ large hgb. Urine Cx - 8/19 - no growth  Hospital day # 22  I had long discussion with husband at bedside through interpreter, updated pt current condition, treatment plan and potential prognosis, and answered all the questions. He expressed understanding and appreciation.     9/19, MD PhD Stroke Neurology 08/17/2020 1:35 PM  To contact Stroke Continuity provider, please refer to [facility] Plan. After hours, contact General Neurology

## 2020-08-17 NOTE — Progress Notes (Signed)
[facility]  PROGRESS NOTE  Samantha Paul ZOX:[id number] DOB: 04-Jan-1946 DOA: 07/26/2020 PCP: Rometta Emery, MD Admit date - 07/26/2020   Admitting Physician Caryl Pina, MD  Outpatient Primary MD for the patient is Rometta Emery, MD  LOS - 22 Brief Narrative   Samantha Paul is a 74 y.o. year old female with medical history significant for atrial fibrillation with poor adherence to Eliquis, diabetes, HTN, prior CVA who presented on 07/26/2020 with acute onset right gaze deviation, left-sided hemiplegia receptive/expressive aphasia after unwitnessed fall heard by her husband who found her in the kitchen with the above findings.  Code stroke was called and CT head showed no acute findings, but CTA head/neck showed embolic occlusion of the right carotid with minimal flow seen in the right MCA territory with large penumbra remainder of the right MCA territory.  Patient was admitted to neuro ICU and underwent TPA thrombectomy patient was intubated for revascularization procedure and required prolonged mechanical ventilation due to poor mentation before successful extubation on 8/14.  Hospital course was also complicated by Klebsiella pneumonia (respiratory culture on 8/8) requiring course of Zosyn followed by ceftriaxone for total of 9 days (8/7-8/15)  [facility] was consulted on 8/19 in the setting of code sepsis as patient was febrile with T-max of one 2.5, tachycardic, tachypneic, WBC of 49.9.  She was empirically started on vancomycin and cefepime for HCAP.  C. difficile was sent given diarrhea and was found to be positive so patient was started on oral vancomycin and IV Flagyl.  In work-up CT abdomen also showed concern for potential pyelonephritis, but UA was essentially unremarkable    Subjective  Today husband is at bedside.  Spoke to him with the help of bedside translator.  We talked about her prognosis and the severity of herstroke as well as her improvement from an infection standpoint. She  remains afebrile.   A & P  Sepsis secondary to C. difficile colitis, improving.  Afebrile greater than 48 hours, blood cultures remain unremarkable x48 hours, leukocytosis resolved, sepsis physiology resolved -Continue oral vancomycin, IV Flagyl -DC IV vancomycinon 8/23 -On IV cefepime given query for potential pyelonephritis on CT abdomen on 8/20,  -Continue monitor stool output  Possible pyelonephritis. UA on 8/19 only shows large hgbb and budding yeast. No growth on urine culture. Patient was previously treated for klebsiella pneumonia with IV ceftriaxone. CT abd on 8/20 mentioned concerning perinephric fat stranding concerning for acute pylelonephritis. Currently no culture data and unable to asses urinary symptoms given mentation after CVA --currently on IV cefepime(given hospitalization), at risk for MDR given recent broad spectrum antibiotics earler in hospital stay  Acute right MCA stroke right ICA total occlusion, Status post TPA and revascularization by IR on 8/1 Dysphagia, secondary to acute CVA Patient has dense left hemiplegia and unable to handle oral secretions or support herself nutritionally.  Continues to have poor ability to clear oral secretions, at high risk for aspiration pneumonia.  Chest x-ray this a.m. showed cardiomegaly with no edema, no opacities. -Continue aspirin statin -Currently n.p.o. with core track feeding tube as recommended by ST -Surgery plans for PEG tube once stable from infection standpoint -Palliative care has been assisting with goals of care discussions  Atrial fibrillation, currently rate controlled.  Patient was nonadherent with home Eliquis prior to admission -Diltiazem 90 mg every 6 hours -Lopressor 50 mg twice daily -Neurology recommends holding off on home Eliquis given large infarct, petechial hemorrhaging, and eventual need for PEG procedure  Acute on  chronic deficiency anemia table, stable. Iron panel consistent with deficiency. Acutely  worsened in setting of c.diff colitis. Hgb stable at 7.3 -Oral iron 3 times daily, via core track --transfuse to keep hgb goal > 7  Type 2 diabetes, controlled. A1c 8.7. FBG at goal -Holding home Metformin -Lantus 22 units daily -NovoLog 5 units every 4 hours for 2 weeks protocol  HLD, stable on-Crestor   Family Communication  : Husband updated at bedside on 8/22 via assistance from translator present  Code Status :  FULL CODE  Disposition Plan  :  Patient is from home. Anticipated d/c date:  Greater than 3 days. Barriers to d/c or necessity for inpatient status:  Currently requiring IV antibiotics for C. difficile colitis and likely pyelonephritis, close monitoring of feeding/neurologic status/respiratory status  Procedures  :   8/4 Cortrak 8/1 revascularization of right ACA by IR  DVT Prophylaxis  :   SCDs   Lab Results  Component Value Date   PLT 247 08/17/2020    Diet :  Diet Order    None       Inpatient Medications Scheduled Meds: .  stroke: mapping our early stages of recovery book   Does not apply Once  . aspirin  81 mg Per Tube Daily  . bethanechol  10 mg Per Tube TID  . chlorhexidine  15 mL Mouth Rinse BID  . Chlorhexidine Gluconate Cloth  6 each Topical Daily  . diltiazem  90 mg Per Tube Q6H  . feeding supplement (PROSource TF)  45 mL Per Tube Daily  . ferrous sulfate  300 mg Per Tube TID  . insulin aspart  0-15 Units Subcutaneous Q4H  . insulin aspart  5 Units Subcutaneous Q4H  . insulin glargine  22 Units Subcutaneous Daily  . mouth rinse  15 mL Mouth Rinse q12n4p  . metoprolol tartrate  50 mg Per Tube BID  . rosuvastatin  20 mg Per Tube Daily  . vancomycin  500 mg Per Tube Q6H   Continuous Infusions: . sodium chloride    . ceFEPime (MAXIPIME) IV 2 g (08/17/20 0400)  . feeding supplement (JEVITY 1.2 CAL) 50 mL/hr at 08/15/20 1500  . metronidazole 500 mg (08/17/20 0726)   PRN Meds:.sodium chloride, acetaminophen **OR** acetaminophen (TYLENOL)  oral liquid 160 mg/5 mL **OR** acetaminophen, senna-docusate  Antibiotics  :   Anti-infectives (From admission, onward)   Start     Dose/Rate Route Frequency Ordered Stop   08/15/20 0500  vancomycin (VANCOCIN) IVPB 1000 mg/200 mL premix  Status:  Discontinued        1,000 mg 200 mL/hr over 60 Minutes Intravenous Every 36 hours 08/13/20 1657 08/16/20 1255   08/14/20 2000  vancomycin (VANCOCIN) 50 mg/mL oral solution 500 mg        500 mg Per Tube Every 6 hours 08/14/20 1820 08/28/20 0159   08/14/20 0730  vancomycin (VANCOCIN) 50 mg/mL oral solution 500 mg  Status:  Discontinued        500 mg Oral Every 6 hours 08/14/20 0649 08/14/20 1820   08/14/20 0730  metroNIDAZOLE (FLAGYL) IVPB 500 mg        500 mg 100 mL/hr over 60 Minutes Intravenous Every 8 hours 08/14/20 0649 08/28/20 0729   08/13/20 1700  vancomycin (VANCOREADY) IVPB 1250 mg/250 mL        1,250 mg 166.7 mL/hr over 90 Minutes Intravenous  Once 08/13/20 1656 08/13/20 1958   08/13/20 1630  ceFEPIme (MAXIPIME) 2 g in sodium chloride 0.9 %  100 mL IVPB        2 g 200 mL/hr over 30 Minutes Intravenous Every 12 hours 08/13/20 1622     08/13/20 1600  ampicillin-sulbactam (UNASYN) 1.5 g in sodium chloride 0.9 % 100 mL IVPB  Status:  Discontinued        1.5 g 200 mL/hr over 30 Minutes Intravenous Every 8 hours 08/13/20 1447 08/13/20 1558   08/06/20 1630  cefTRIAXone (ROCEPHIN) 2 g in sodium chloride 0.9 % 100 mL IVPB        2 g 200 mL/hr over 30 Minutes Intravenous Every 24 hours 08/06/20 1325 08/09/20 1701   08/03/20 1630  cefTRIAXone (ROCEPHIN) 2 g in sodium chloride 0.9 % 100 mL IVPB  Status:  Discontinued        2 g 200 mL/hr over 30 Minutes Intravenous Every 24 hours 08/03/20 1547 08/06/20 1325   08/01/20 1200  piperacillin-tazobactam (ZOSYN) IVPB 3.375 g  Status:  Discontinued        3.375 g 12.5 mL/hr over 240 Minutes Intravenous Every 8 hours 08/01/20 1134 08/03/20 1509   07/26/20 1336  ceFAZolin (ANCEF) 2-4 GM/100ML-% IVPB        Note to Pharmacy: [facility]   : cabinet override      07/26/20 1336 07/27/20 0144       Objective   Vitals:   08/17/20 0022 08/17/20 0300 08/17/20 0816 08/17/20 0855  BP: (!) 126/111 128/67 115/75 (!) 110/38  Pulse: 88 76 99 89  Resp:  16 16 14   Temp: 98 F (36.7 C) 98.3 F (36.8 C) 97.9 F (36.6 C) 98.3 F (36.8 C)  TempSrc:   Axillary Axillary  SpO2:   100% 100%  Weight:      Height:        SpO2: 100 % O2 Flow Rate (L/min): 3.5 L/min FiO2 (%): 40 %  Wt Readings from Last 3 Encounters:  08/16/20 72.6 kg  06/29/20 81.6 kg  08/29/18 58.1 kg     Intake/Output Summary (Last 24 hours) at 08/17/2020 1155 Last data filed at 08/17/2020 1147 Gross per 24 hour  Intake 160 ml  Output 6400 ml  Net -6240 ml    Physical Exam:     Lying in bed, resting comfortably Left hemiplegia Sporadically moves right hand, able to squeeze right hand Copious oral secretions  No tachypnea, normal respirator effort on 3.5 L [place] increased upper airway sounds Regular rate, no peripheral edema noted Abdomen soft   I have personally reviewed the following:   Data Reviewed:  CBC Recent Labs  Lab 08/12/20 0151 08/13/20 1352 08/13/20 1634 08/13/20 1634 08/13/20 2200 08/14/20 1321 08/15/20 0302 08/16/20 0203 08/17/20 0844  WBC 20.1*   < > 49.9*  --   --  35.5* 21.7* 12.8* 10.1  HGB 7.7*   < > 6.8*   < > 8.1* 7.7* 7.3* 7.3* 7.5*  HCT 26.3*   < > 23.5*   < > 26.7* 25.0* 24.2* 24.6* 25.7*  PLT 471*   < > 399  --   --  298 238 306 247  MCV 72.9*   < > 73.9*  --   --  75.5* 75.6* 75.9* 76.5*  MCH 21.3*   < > 21.4*  --   --  23.3* 22.8* 22.5* 22.3*  MCHC 29.3*   < > 28.9*  --   --  30.8 30.2 29.7* 29.2*  RDW 18.8*   < > 19.8*  --   --  19.9* 20.3* 21.2*  22.2*  LYMPHSABS 1.7  --  2.0  --   --   --   --   --   --   MONOABS 1.7*  --  2.5*  --   --   --   --   --   --   EOSABS 0.1  --  0.5  --   --   --   --   --   --   BASOSABS 0.1  --  0.0  --   --   --   --   --   --    <  > = values in this interval not displayed.    Chemistries  Recent Labs  Lab 08/13/20 1352 08/14/20 1321 08/15/20 0302 08/16/20 0203 08/17/20 0220  NA 132* 131* 131* 133* 136  K 4.6 4.4 4.5 4.3 4.3  CL 101 101 102 104 107  CO2 21* 19* 21* 22 21*  GLUCOSE 120* 174* 146* 140* 89  BUN 35* 31* 31* 31* 31*  CREATININE 1.37* 1.31* 1.28* 1.30* 1.31*  CALCIUM 8.2* 8.2* 8.1* 8.3* 8.4*  AST  --   --  28  --   --   ALT  --   --  43  --   --   ALKPHOS  --   --  159*  --   --   BILITOT  --   --  0.3  --   --    ------------------------------------------------------------------------------------------------------------------ No results for input(s): CHOL, HDL, LDLCALC, TRIG, CHOLHDL, LDLDIRECT in the last 72 hours.  Lab Results  Component Value Date   HGBA1C 8.7 (H) 07/27/2020   ------------------------------------------------------------------------------------------------------------------ No results for input(s): TSH, T4TOTAL, T3FREE, THYROIDAB in the last 72 hours.  Invalid input(s): FREET3 ------------------------------------------------------------------------------------------------------------------ No results for input(s): VITAMINB12, FOLATE, FERRITIN, TIBC, IRON, RETICCTPCT in the last 72 hours.  Coagulation profile Recent Labs  Lab 08/13/20 1634  INR 1.2    No results for input(s): DDIMER in the last 72 hours.  Cardiac Enzymes No results for input(s): CKMB, TROPONINI, MYOGLOBIN in the last 168 hours.  Invalid input(s): CK ------------------------------------------------------------------------------------------------------------------ No results found for: BNP  Micro Results Recent Results (from the past 240 hour(s))  Urine Culture     Status: None   Collection Time: 08/13/20 10:43 AM   Specimen: Urine, Random  Result Value Ref Range Status   Specimen Description URINE, RANDOM  Final   Special Requests NONE  Final   Culture   Final    NO GROWTH Performed at  [facility], [address], [place], [place] [address]    Report Status 08/14/2020 FINAL  Final  Culture, blood (routine x 2)     Status: None (Preliminary result)   Collection Time: 08/13/20  1:52 PM   Specimen: BLOOD RIGHT HAND  Result Value Ref Range Status   Specimen Description BLOOD RIGHT HAND  Final   Special Requests   Final    BOTTLES DRAWN AEROBIC ONLY Blood Culture adequate volume   Culture   Final    NO GROWTH 4 DAYS Performed at [facility], [address], [place], [place] [address]    Report Status PENDING  Incomplete  Culture, blood (routine x 2)     Status: None (Preliminary result)   Collection Time: 08/13/20  1:58 PM   Specimen: BLOOD RIGHT HAND  Result Value Ref Range Status   Specimen Description BLOOD RIGHT HAND  Final   Special Requests   Final    BOTTLES  DRAWN AEROBIC ONLY Blood Culture adequate volume   Culture   Final    NO GROWTH 4 DAYS Performed at [facility], [address] [place], [place] [address]    Report Status PENDING  Incomplete  C Difficile Quick Screen (NO PCR Reflex)     Status: Abnormal   Collection Time: 08/14/20  1:48 AM   Specimen: STOOL  Result Value Ref Range Status   C Diff antigen POSITIVE (A) NEGATIVE Final   C Diff toxin POSITIVE (A) NEGATIVE Final   C Diff interpretation Toxin producing C. difficile detected.  Final    Comment: CRITICAL RESULT CALLED TO, READ BACK BY AND VERIFIED WITH: Lutricia Horsfall RN 08/14/20 0501 JDW Performed at [facility], [address], [place], [place] [address]     Radiology Reports CT HEAD WO CONTRAST  Result Date: 08/01/2020 CLINICAL DATA:  Stroke follow-up. Postop endovascular revascularization of occluded right internal carotid artery distally. EXAM: CT HEAD WITHOUT CONTRAST TECHNIQUE: Contiguous axial images were obtained from the base of the skull through the vertex without intravenous contrast. COMPARISON:  CT head 07/27/2020.  MRI head 07/27/2020 FINDINGS: Brain:  Involving acute right MCA infarct. There is progressive edema in the right insula and right frontal lobe. There is involvement of the basal ganglia with a mild amount of hemorrhage in the right basal ganglia. This is unchanged. This is lateral to the area of calcification in the right basal ganglia. Acute infarct in the caudate is more apparent and lower density today. Right medial frontal lobe infarct also shows progressive edema. Small area of infarct in the right occipital lobe best seen on diffusion-weighted imaging. Generalized atrophy. Negative for hydrocephalus. 1 mm midline shift to the left. No new area of hemorrhage. Chronic infarct in the left pons. Vascular: Negative for hyperdense vessel Skull: Negative Sinuses/Orbits: Mucosal edema paranasal sinuses.  Negative orbit Other: None IMPRESSION: Evolving acute right MCA infarct. Low-density edema infarction is more apparent now in the insula and frontal lobe and basal ganglia. There is progressive low-density in the head of the caudate which did show infarct on prior diffusion imaging. Small amount of hemorrhage in the right basal ganglia is unchanged from the prior CT. Right frontal ACA infarct is unchanged in size but is lower density today. Small acute infarct right occipital lobe best seen on diffusion-weighted imaging. No new area of hemorrhage since the prior studies. Electronically Signed   By: Marlan Palau M.D.   On: 08/01/2020 15:43   CT HEAD WO CONTRAST  Addendum Date: 07/27/2020   ADDENDUM REPORT: 07/27/2020 13:27 ADDENDUM: Study discussed by telephone with Dr. Pearlean Brownie on 07/27/2020 at 1313 hours. He advised that follow-up MRI is pending. Electronically Signed   By: Odessa Fleming M.D.   On: 07/27/2020 13:27   Result Date: 07/27/2020 CLINICAL DATA:  74 year old female status post code stroke presentation with right ICA terminus occlusion. Status post IV tPA, endovascular revascularization. EXAM: CT HEAD WITHOUT CONTRAST TECHNIQUE: Contiguous axial  images were obtained from the base of the skull through the vertex without intravenous contrast. COMPARISON:  Post treatment plain head CT, CTA head, CT Perfusion, presentation head CT yesterday. FINDINGS: Brain: Mass effect now on the right lateral ventricle, especially the right frontal horn (series 6, image 13) with mixed hypo- and hyper density in the anterior right frontal lobe, right inferior frontal gyrus and right basal ganglia (series 6, image 13). No significant midline shift. No extra-axial hemorrhage or ventriculomegaly. Superimposed dystrophic basal ganglia calcifications  are stable. Posterior to the anterior genu gray-white matter differentiation appears largely stable. There is possible early cytotoxic edema at the posterior operculum. Stable left hemisphere and posterior fossa gray-white matter differentiation. No other intracranial blood products. Vascular: Calcified atherosclerosis at the skull base. No suspicious intracranial vascular hyperdensity. Skull: No acute osseous abnormality identified. Sinuses/Orbits: Intubated, small volume of fluid layering in the pharynx. Mild paranasal sinus fluid or mucosal thickening. Tympanic cavities and mastoids remain clear. Other: Disconjugate gaze now.  Scalp soft tissues remain negative. IMPRESSION: 1. New mass effect in the anterior right frontal lobe with regional increasing mixed hypo- and hyperdensity. Favor a combination of anterior right MCA territory cytotoxic edema and petechial hemorrhage. 2. No malignant hemorrhagic transformation at this time. No midline shift or loss of basilar cisterns. 3. Serial noncontrast Head CT for surveillance of mass effect recommended. Electronically Signed: By: Odessa Fleming M.D. On: 07/27/2020 12:34   CT HEAD WO CONTRAST  Result Date: 07/26/2020 CLINICAL DATA:  Stroke, follow-up post revascularization. EXAM: CT HEAD WITHOUT CONTRAST TECHNIQUE: Contiguous axial images were obtained from the base of the skull through the  vertex without intravenous contrast. COMPARISON:  CT angiogram head/neck and CT perfusion 07/26/2020, noncontrast head CT 07/26/2020 FINDINGS: Brain: Redemonstrated mineralization within the bilateral basal ganglia. There is subtle loss of gray-white differentiation within the right insula consistent with acute ischemic infarction. Additionally, there is subtle asymmetric hyperdensity of the right caudate and lentiform nuclei which may reflect contrast staining/enhancement at site of acute infarction. No convincing evidence of acute intracranial hemorrhage. Stable background generalized parenchymal atrophy and chronic small vessel ischemic disease. No extra-axial fluid collection. No evidence of intracranial mass. No midline shift. Vascular: Residual circulating contrast material limits evaluation for hyperdense vessels Skull: Normal. Negative for fracture or focal lesion. Sinuses/Orbits: Visualized orbits show no acute finding. Mild ethmoid and maxillary sinus mucosal thickening at the imaged levels. No significant mastoid effusion IMPRESSION: Subtle changes of acute ischemic infarction within the right insula. Additionally, there is subtle asymmetric hyperdensity of the right caudate and lentiform nuclei which may reflect contrast staining or enhancement at site of acute infarction. No convincing evidence of acute intracranial hemorrhage. Stable background generalized parenchymal atrophy and chronic small vessel ischemic disease. Electronically Signed   By: Jackey Loge DO   On: 07/26/2020 15:47   CT CHEST W CONTRAST  Result Date: 08/14/2020 CLINICAL DATA:  Abdominal pain, fever, sepsis EXAM: CT CHEST, ABDOMEN, AND PELVIS WITH CONTRAST TECHNIQUE: Multidetector CT imaging of the chest, abdomen and pelvis was performed following the standard protocol during bolus administration of intravenous contrast. CONTRAST:  OMNIPAQUE IOHEXOL 300 MG/ML  SOLN COMPARISON:  07/29/2020, 06/29/2020 FINDINGS: CT CHEST  FINDINGS Cardiovascular: Heart is mildly enlarged with prominent biatrial dilatation. No pericardial effusion. There is calcification of the mitral annulus. The aorta is normal in caliber with no evidence of aneurysm or dissection. Mediastinum/Nodes: An enteric catheter extends into the gastric lumen. Stable appearance of the thyroid. Trachea is unremarkable. No pathologic adenopathy. Lungs/Pleura: Respiratory motion limits evaluation of the lungs. There is upper lobe predominant interlobular septal thickening, with trace bilateral pleural effusions, suggesting mild edema. No focal consolidation or pneumothorax. Musculoskeletal: No acute or destructive bony lesions. Reconstructed images demonstrate no additional findings. CT ABDOMEN PELVIS FINDINGS Hepatobiliary: No focal liver abnormality is seen. Status post cholecystectomy. No biliary dilatation. Pancreas: Unremarkable. No pancreatic ductal dilatation or surrounding inflammatory changes. Spleen: Normal in size without focal abnormality. Adrenals/Urinary Tract: Evaluation limited due to respiratory motion. There is heterogeneous  decreased enhancement of the left kidney, with mild renal edema and perinephric fat stranding. Findings are concerning for acute pyelonephritis. No evidence of renal abscess. Large right renal cyst again noted. The remainder of the right kidney enhances normally. The adrenals are unremarkable. The bladder is moderately distended without focal abnormality. Stomach/Bowel: Enteric catheter within the bladder lumen. No bowel obstruction or ileus. No wall thickening or inflammatory change. Rectal catheter in place. Vascular/Lymphatic: No significant vascular findings are present. No enlarged abdominal or pelvic lymph nodes. Reproductive: Uterus and bilateral adnexa are unremarkable. Other: No free fluid or free gas. Mild subcutaneous edema within the bilateral flanks. No abdominal wall hernia. Musculoskeletal: No acute or destructive bony  lesions. Reconstructed images demonstrate no additional findings. IMPRESSION: 1. Heterogeneous decreased enhancement of the left kidney, with mild renal edema and perinephric fat stranding. Findings are concerning for acute pyelonephritis. No evidence of renal abscess. 2. Mild cardiomegaly with prominent biatrial dilatation. 3. Trace bilateral pleural effusions and mild interstitial edema. Electronically Signed   By: Sharlet Salina M.D.   On: 08/14/2020 15:59   MR BRAIN WO CONTRAST  Result Date: 07/27/2020 CLINICAL DATA:  Stroke follow-up EXAM: MRI HEAD WITHOUT CONTRAST TECHNIQUE: Multiplanar, multiecho pulse sequences of the brain and surrounding structures were obtained without intravenous contrast. COMPARISON:  Brain MRI 07/10/2018 FINDINGS: Brain: Multifocal ischemic infarction within the right MCA territory, greatest at the frontal operculum and insula. There is also cortical ischemia within the right ACA territory and right PCA territory. There is a punctate focus of acute ischemia in the left occipital lobe. Large area of infarction in the right basal ganglia. There is petechial hemorrhage versus is contrast staining in the right basal ganglia as well. Moderate edema associated with the ischemic areas. There is mass effect on the frontal horn of the right lateral ventricle. No herniation. There is an old left pontine infarct. No chronic microhemorrhage. Normal midline structures. Vascular: Normal flow voids. Skull and upper cervical spine: Normal marrow signal. Sinuses/Orbits: Right maxillary sinus mucosal thickening. Normal orbits. Other: None. IMPRESSION: 1. Multifocal ischemic infarction within the right MCA territory, greatest at the frontal operculum and insula. 2. Additional cortical ischemia within the right ACA territory and right PCA territory. 3. Large area of infarction in the right basal ganglia with petechial hemorrhage versus contrast staining. 4. Punctate focus of acute ischemia in the left  occipital lobe. Electronically Signed   By: Deatra Robinson M.D.   On: 07/27/2020 23:50   CT ABDOMEN PELVIS W CONTRAST  Result Date: 08/14/2020 CLINICAL DATA:  Abdominal pain, fever, sepsis EXAM: CT CHEST, ABDOMEN, AND PELVIS WITH CONTRAST TECHNIQUE: Multidetector CT imaging of the chest, abdomen and pelvis was performed following the standard protocol during bolus administration of intravenous contrast. CONTRAST:  OMNIPAQUE IOHEXOL 300 MG/ML  SOLN COMPARISON:  07/29/2020, 06/29/2020 FINDINGS: CT CHEST FINDINGS Cardiovascular: Heart is mildly enlarged with prominent biatrial dilatation. No pericardial effusion. There is calcification of the mitral annulus. The aorta is normal in caliber with no evidence of aneurysm or dissection. Mediastinum/Nodes: An enteric catheter extends into the gastric lumen. Stable appearance of the thyroid. Trachea is unremarkable. No pathologic adenopathy. Lungs/Pleura: Respiratory motion limits evaluation of the lungs. There is upper lobe predominant interlobular septal thickening, with trace bilateral pleural effusions, suggesting mild edema. No focal consolidation or pneumothorax. Musculoskeletal: No acute or destructive bony lesions. Reconstructed images demonstrate no additional findings. CT ABDOMEN PELVIS FINDINGS Hepatobiliary: No focal liver abnormality is seen. Status post cholecystectomy. No biliary dilatation. Pancreas: Unremarkable.  No pancreatic ductal dilatation or surrounding inflammatory changes. Spleen: Normal in size without focal abnormality. Adrenals/Urinary Tract: Evaluation limited due to respiratory motion. There is heterogeneous decreased enhancement of the left kidney, with mild renal edema and perinephric fat stranding. Findings are concerning for acute pyelonephritis. No evidence of renal abscess. Large right renal cyst again noted. The remainder of the right kidney enhances normally. The adrenals are unremarkable. The bladder is moderately distended  without focal abnormality. Stomach/Bowel: Enteric catheter within the bladder lumen. No bowel obstruction or ileus. No wall thickening or inflammatory change. Rectal catheter in place. Vascular/Lymphatic: No significant vascular findings are present. No enlarged abdominal or pelvic lymph nodes. Reproductive: Uterus and bilateral adnexa are unremarkable. Other: No free fluid or free gas. Mild subcutaneous edema within the bilateral flanks. No abdominal wall hernia. Musculoskeletal: No acute or destructive bony lesions. Reconstructed images demonstrate no additional findings. IMPRESSION: 1. Heterogeneous decreased enhancement of the left kidney, with mild renal edema and perinephric fat stranding. Findings are concerning for acute pyelonephritis. No evidence of renal abscess. 2. Mild cardiomegaly with prominent biatrial dilatation. 3. Trace bilateral pleural effusions and mild interstitial edema. Electronically Signed   By: Sharlet Salina M.D.   On: 08/14/2020 15:59   CT CEREBRAL PERFUSION W CONTRAST  Result Date: 07/26/2020 CLINICAL DATA:  Acute presentation with left body weakness and speech disturbance. EXAM: CT ANGIOGRAPHY HEAD AND NECK CT PERFUSION BRAIN TECHNIQUE: Multidetector CT imaging of the head and neck was performed using the standard protocol during bolus administration of intravenous contrast. Multiplanar CT image reconstructions and MIPs were obtained to evaluate the vascular anatomy. Carotid stenosis measurements (when applicable) are obtained utilizing NASCET criteria, using the distal internal carotid diameter as the denominator. Multiphase CT imaging of the brain was performed following IV bolus contrast injection. Subsequent parametric perfusion maps were calculated using RAPID software. CONTRAST:  Not annotated COMPARISON:  Head CT earlier same day FINDINGS: CTA NECK FINDINGS Aortic arch: No aortic atherosclerotic calcification seen. No aneurysm or dissection. Branching pattern is normal with  wide patency of the origins. Right carotid system: Common carotid artery widely patent to the bifurcation. No atherosclerotic disease at the carotid bifurcation. ICA widely patent. Left carotid system: Common carotid artery widely patent to the bifurcation. Carotid bifurcation is normal without soft or calcified plaque. ICA widely patent. Vertebral arteries: Both vertebral artery origins are widely patent. Both vertebral arteries are normal through the cervical region to the foramen magnum. Skeleton: Ordinary cervical spondylosis. Other neck: No mass or lymphadenopathy. Upper chest: No active chest disease. 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 Review of the MIP images confirms the above findings CTA HEAD FINDINGS Anterior circulation: Complete occlusion of the right carotid terminus. Minimal flow visible in the right MCA territory. Right ACA receives it supply through the anterior communicating route. No left stenosis or occlusion. Right PCA arises from the carotid artery immediately proximal to the occlusion. Posterior circulation: Both vertebral arteries patent to the basilar. Atherosclerotic narrowing the distal vertebral arteries and of the basilar. Posterior circulation branch vessels are patent. Right PCA receives most of it supply from the anterior circulation. Venous sinuses: Patent and normal. Anatomic variants: None significant. 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 Minimal flow seen in  the right MCA territory. Right ACA territory appears normally perfused, probably due to a patent anterior communicating artery. 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 Large penumbra, 136 cc in the  remainder of the right MCA territory. No aortic atherosclerotic disease seen. No atherosclerotic disease at either carotid bifurcation. Some atherosclerotic narrowing of the distal vertebral arteries and basilar artery. Preliminary report sent by text at 1238 hours. Discussed with stroke PA 1242 hours. Electronically Signed   By: Paulina Fusi M.D.   On: 07/26/2020 12:48   DG CHEST PORT 1 VIEW  Result Date: 08/16/2020 CLINICAL DATA:  74 year old female with history of stroke. EXAM: PORTABLE CHEST 1 VIEW COMPARISON:  Chest x-ray 08/13/2020. FINDINGS: Feeding tube extending into the stomach with tip coiled back in the proximal stomach. Lung volumes are normal. No consolidative airspace disease. No pleural effusions. No evidence of pulmonary edema. No pneumothorax. No suspicious appearing pulmonary nodules or masses are noted. Mild cardiomegaly. Upper mediastinal contours are within normal limits allowing for patient positioning. Surgical clips project over the right upper quadrant of the abdomen, likely from prior cholecystectomy. IMPRESSION: 1. Tip of feeding tube is in the proximal stomach. 2. No radiographic evidence of acute cardiopulmonary disease. 3. Mild cardiomegaly. Electronically Signed   By: Trudie Reed M.D.   On: 08/16/2020 10:09   DG CHEST PORT 1 VIEW  Result Date: 08/13/2020 CLINICAL DATA:  Febrile with leukocytosis. Dysphagia due to recent cerebrovascular accident. EXAM: PORTABLE CHEST 1 VIEW COMPARISON:  08/05/2020 FINDINGS: Feeding tube tip projects at the gastric cardia, similar to prior. Cardiomediastinal silhouette is similar to prior. Similar mild elevation the right hemidiaphragm. No consolidation. Linear bibasilar opacities, favored to represent atelectasis. No sizable pleural effusion or discernible pneumothorax. Biapical pleuroparenchymal scarring. The visualized skeletal structures are unremarkable. Apparent widening of the left glenohumeral joint. IMPRESSION: 1. No evidence of  consolidation, although portable technique limits evaluation of the lung bases. Dedicated PA and lateral radiographs could better evaluate if the patient is able. 2. Apparent widening of the left glenohumeral joint. Consider dedicated left shoulder radiographs to further evaluate. Electronically Signed   By: Feliberto Harts MD   On: 08/13/2020 11:54   DG CHEST PORT 1 VIEW  Result Date: 08/05/2020 CLINICAL DATA:  Fever EXAM: PORTABLE CHEST 1 VIEW COMPARISON:  08/03/2020 FINDINGS: Endotracheal tube terminates 3 cm above the carina. Weighted feeding tube terminates in the gastric cardia. Lungs are clear. No pleural effusion or pneumothorax. Mild eventration of the right hemidiaphragm. Cardiomegaly. IMPRESSION: Endotracheal tube terminates 3 cm above the carina. Weighted feeding tube terminates in the gastric cardia. Electronically Signed   By: Charline Bills M.D.   On: 08/05/2020 09:53   DG CHEST PORT 1 VIEW  Result Date: 08/03/2020 CLINICAL DATA:  Fevers. EXAM: PORTABLE CHEST 1 VIEW COMPARISON:  08/01/2020 FINDINGS: ETT tip is 3.4 cm above the carina. Feeding tube is noted with tip looped in the stomach. Mild cardiac enlargement. There is no pleural effusion or edema. No airspace opacities. IMPRESSION: 1. Stable position of support apparatus. 2. Mild cardiac enlargement. Electronically Signed   By: Signa Kell M.D.   On: 08/03/2020 10:29   DG CHEST PORT 1 VIEW  Result Date: 08/01/2020 CLINICAL DATA:  Cardiomegaly, CHF EXAM: PORTABLE CHEST 1 VIEW COMPARISON:  07/27/2020 chest radiograph. FINDINGS: Endotracheal tube tip is 3.3 cm above the carina. Weighted enteric tube loops in the proximal stomach with the tip overlying the gastric cardia/fundus. Stable cardiomediastinal silhouette with mild to moderate cardiomegaly. No pneumothorax. No pleural  effusion. No overt pulmonary edema. No acute consolidative airspace disease. IMPRESSION: Well-positioned support structures. Stable mild-to-moderate  cardiomegaly without overt pulmonary edema. Electronically Signed   By: Delbert Phenix M.D.   On: 08/01/2020 08:57   DG Chest Port 1 View  Result Date: 07/27/2020 CLINICAL DATA:  History of stroke and hypertension EXAM: PORTABLE CHEST 1 VIEW COMPARISON:  07/26/2020 FINDINGS: Endotracheal and enteric tubes are unchanged in position. Shallow inspiration. Cardiac enlargement. No vascular congestion or edema. Lungs are clear with improvement since prior study. No effusions. Surgical clips in the right upper quadrant. IMPRESSION: Cardiac enlargement. No evidence of active pulmonary disease. Improvement since prior study. Electronically Signed   By: Burman Nieves M.D.   On: 07/27/2020 06:35   DG CHEST PORT 1 VIEW  Result Date: 07/26/2020 CLINICAL DATA:  Inhibition EXAM: PORTABLE CHEST 1 VIEW COMPARISON:  Chest x-ray dated 06/29/2020. FINDINGS: Endotracheal tube appears well positioned with tip approximately 3 cm above the carina. Enteric tube passes below the diaphragm. Cardiomegaly. Central pulmonary vascular congestion. No pleural effusion or pneumothorax is seen. IMPRESSION: 1. Endotracheal tube appears well positioned with tip approximately 3 cm above the carina. 2. Cardiomegaly with central pulmonary vascular congestion indicating CHF/volume overload. Electronically Signed   By: Bary Richard M.D.   On: 07/26/2020 16:58   DG Abd Portable 1V  Result Date: 07/29/2020 CLINICAL DATA:  Feeding into placement EXAM: PORTABLE ABDOMEN - 1 VIEW COMPARISON:  July 26, 2020 FINDINGS: Nasogastric tube removed. Feeding tube present with tip in the stomach. Visualized bowel unremarkable. No free air evident. Visualized lungs clear. Endotracheal tube tip is 4.0 cm above the carina. IMPRESSION: Feeding tube tip in stomach. Visualized bowel gas pattern unremarkable. Visualized lungs clear. Electronically Signed   By: Bretta Bang III M.D.   On: 07/29/2020 12:02   DG Abd Portable 1V  Result Date: 07/26/2020 CLINICAL  DATA:  OG tube placement EXAM: PORTABLE ABDOMEN - 1 VIEW COMPARISON:  None. FINDINGS: Enteric tube appears well positioned in the stomach. Visualized bowel gas pattern is nonobstructive. Cholecystectomy clips in the RIGHT upper quadrant. IMPRESSION: Enteric tube appears well positioned in the stomach. Electronically Signed   By: Bary Richard M.D.   On: 07/26/2020 16:57   IR PERCUTANEOUS ART THROMBECTOMY/INFUSION INTRACRANIAL INC DIAG ANGIO  Result Date: 07/28/2020 INDICATION: Acute onset of left-sided hemiplegia, right gaze deviation and slurred speech. Occluded right internal carotid artery supraclinoid segment, right middle cerebral artery and the right anterior cerebral artery. EXAM: 1. EMERGENT LARGE VESSEL OCCLUSION THROMBOLYSIS (anterior CIRCULATION) COMPARISON:  CT angiogram of the head and neck of July 26, 2020. MEDICATIONS: Ancef 2 g IV antibiotic was administered within 1 hour of the procedure. ANESTHESIA/SEDATION: General anesthesia CONTRAST:  Isovue 300 approximately 100 mL FLUOROSCOPY TIME:  Fluoroscopy Time: 38 minutes 36 seconds (1410 mGy). COMPLICATIONS: None immediate. TECHNIQUE: Following a full explanation of the procedure along with the potential associated complications, an informed witnessed consent was obtained the patient's spouse via an interpreter. The risks of intracranial hemorrhage of 10%, worsening neurological deficit, ventilator dependency, death and inability to revascularize were all reviewed in detail with the patient's spouse. The patient was then put under general anesthesia by the Department of Anesthesiology at [facility]. The right groin was prepped and draped in the usual sterile fashion. Thereafter using modified Seldinger technique, transfemoral access into the right common femoral artery was obtained without difficulty. Over a 0.035 inch guidewire a 5 French Pinnacle sheath was inserted. Through this, and also over a 0.035  inch guidewire a 8 French 25 cm  catheter was advanced to the aortic arch region and selectively positioned in the innominate artery and right common carotid artery. FINDINGS: Innominate arteriogram demonstrates the origin of the right subclavian artery and the right common carotid artery to be widely patent. Moderate tortuosity is seen of the right common carotid artery proximally. The right vertebral artery origin is widely patent. The vessel is seen to opacify to the cranial skull base. The opacified portion of the basilar artery on the AP projection demonstrates patency with suggestion of a 50% stenosis of the right vertebrobasilar junction. The right common carotid arteriogram demonstrates the right external carotid artery and its major branches to be widely patent. The right internal carotid artery at the bulb to the cranial skull base is patent with moderate severe tortuosity involving the mid right M1 segment associated with a 50% stenosis. The petrous and proximal cavernous segments are widely patent. There is complete angiographic occlusion of the right internal carotid artery just distal to the origin of the right posterior communicating artery supplying the right posterior cerebral artery distribution. PROCEDURE: The diagnostic JB 1 catheter in the right common carotid artery was exchanged over a 0.035 inch 300 cm Rosen exchange guidewire for an 087 95 cm balloon guide catheter which had been prepped with 50% contrast and 50% heparinized saline infusion. Guidewire was removed. Good aspiration obtained from the hub of the balloon guide catheter. A gentle contrast injection demonstrated no evidence of vasospasm, or of intraluminal filling defects. Over a 0.014 inch standard Synchro micro guidewire, an 027 160 cm Marksman microcatheter inside of an 071 135 cm Zoom aspiration catheter was advanced without difficulty to the distal cavernous segment of the right internal carotid artery. The micro guidewire was then gently manipulated without  difficulty and advanced into the inferior division of the right middle cerebral artery M2 M3 region followed by the microcatheter. The guidewire was removed. Mild resistance was again to aspiration which cleared with more proximal position of the microcatheter. A gentle control arteriogram performed through the microcatheter demonstrated safe position of tip of the microcatheter, which was connected to continuous heparinized saline infusion. A 3 mm x 20 mm Solitaire X retrieval device was then advanced to the distal end of the microcatheter. The O ring on the delivery microcatheter was loosened. With slight forward gentle traction with the right hand on the delivery micro guidewire with the left hand the delivery microcatheter was retrieved unsheathing the retrieval device. The 071 aspiration catheter was engaged in the occluded right middle cerebral artery M1 segment. Continued aspiration was applied using an aspiration device with proximal flow arrest in the right internal carotid artery, and aspiration using a 60 mL syringe at the hub of the balloon guide catheter for approximately 2-1/2 minutes. Thereafter, the combination of the retrieval device, the microcatheter, aspiration catheter was retrieved and removed as aspiration was applied at the hub of the balloon guide catheter. Large clot and small pieces were seen from within the aspiration catheter. A control arteriogram performed through the balloon guide following reversal of flow arrest in the right internal carotid artery demonstrated wide patency of the superior and inferior divisions of the right middle cerebral artery. A control arteriogram performed following infusion of 25 mcg of nitroglycerin intra-arterially now demonstrated significantly improved caliber and flow through the superior and inferior divisions. There continued be slow flow involving the M3 M4 branches of the anterior of peri insular branches consistent with the site of the  core seen on  the CT perfusion studies. A TICI 2C revascularization of the right MCA distribution had been achieved. The right anterior cerebral artery was now also widely patent with a TICI 3 revascularization. Patency of the right posterior communicating artery and the right posterior cerebral artery territory was intact. Control arteriogram performed through the balloon guide in the right common carotid artery demonstrated free flow in the right internal carotid artery extra cranially and intracranially. This was then removed. The 8 French Pinnacle sheath in the right groin was replaced with an 8 French Angio-Seal closure device for hemostasis. The distal pulses remained Dopplerable in both the DPs and posterior tibial arteries bilaterally unchanged. Post procedure CT scan of the brain demonstrated contrast blush in the right basal ganglia region. No gross mass effect or midline shift was noted. The patient was left intubated on account of the patient's neurologic condition prior to intubation. She was then transported to the neuro ICU for post thrombectomy care. IMPRESSION: Status post endovascular revascularization of occluded right internal carotid artery supraclinoid segment, the right middle cerebral and the right anterior cerebral artery with 1 pass with a 3 mm x 20 mm Solitaire X retrieval device, contact aspiration and proximal flow arrest achieving a TICI 2C revascularization. PLAN: Follow-up as per referring MD. Electronically Signed   By: Julieanne Cotton M.D.   On: 07/27/2020 14:02   CT HEAD CODE STROKE WO CONTRAST  Result Date: 07/26/2020 CLINICAL DATA:  Code stroke. Left-sided weakness. Right gaze disturbance. EXAM: CT HEAD WITHOUT CONTRAST TECHNIQUE: Contiguous axial images were obtained from the base of the skull through the vertex without intravenous contrast. COMPARISON:  07/09/2018 FINDINGS: Brain: Age related volume loss. Chronic small-vessel ischemic changes of the brainstem, cerebellum and cerebral  hemispheres. Physiologic basal ganglia calcification. No visible acute infarction, mass lesion, hemorrhage, hydrocephalus or extra-axial collection. Vascular: There is atherosclerotic calcification of the major vessels at the base of the brain. Skull: Negative Sinuses/Orbits: Clear/normal Other: None ASPECTS ([place] Stroke Program Early CT Score) - Ganglionic level infarction (caudate, lentiform nuclei, internal capsule, insula, M1-M3 cortex): 7 - Supraganglionic infarction (M4-M6 cortex): 3 Total score (0-10 with 10 being normal): 10 IMPRESSION: 1. No acute CT finding. Chronic small-vessel ischemic changes throughout the brain. Physiologic calcification of basal ganglia. 2. ASPECTS is 10 3. Discussed with Dr. Otelia Limes during interpretation at 12 17 hours. Electronically Signed   By: Paulina Fusi M.D.   On: 07/26/2020 12:22   VAS [place] UPPER EXTREMITY VENOUS DUPLEX  Result Date: 08/07/2020 UPPER VENOUS STUDY  Indications: Edema Risk Factors: None identified. Limitations: Body habitus, poor ultrasound/tissue interface and patient positioning, patient movement. Comparison Study: No prior studies. Performing Technologist: Chanda Busing RVT  Examination Guidelines: A complete evaluation includes B-mode imaging, spectral Doppler, color Doppler, and power Doppler as needed of all accessible portions of each vessel. Bilateral testing is considered an integral part of a complete examination. Limited examinations for reoccurring indications may be performed as noted.  Right Findings: +----------+------------+---------+-----------+----------+-------+ RIGHT     CompressiblePhasicitySpontaneousPropertiesSummary +----------+------------+---------+-----------+----------+-------+ Subclavian    Full       Yes       Yes                      +----------+------------+---------+-----------+----------+-------+  Left Findings: +----------+------------+---------+-----------+----------+-------+ LEFT       CompressiblePhasicitySpontaneousPropertiesSummary +----------+------------+---------+-----------+----------+-------+ IJV           Full       Yes       Yes                      +----------+------------+---------+-----------+----------+-------+  Subclavian    Full       Yes       Yes                      +----------+------------+---------+-----------+----------+-------+ Axillary      Full       Yes       Yes                      +----------+------------+---------+-----------+----------+-------+ Brachial      Full       Yes       Yes                      +----------+------------+---------+-----------+----------+-------+ Radial        Full                                          +----------+------------+---------+-----------+----------+-------+ Ulnar         Full                                          +----------+------------+---------+-----------+----------+-------+ Cephalic      None                                   Acute  +----------+------------+---------+-----------+----------+-------+ Basilic       Full                                          +----------+------------+---------+-----------+----------+-------+  Summary:  Right: No evidence of thrombosis in the subclavian.  Left: No evidence of deep vein thrombosis in the upper extremity. Findings consistent with acute superficial vein thrombosis involving the left cephalic vein.  *See table(s) above for measurements and observations.  Diagnosing physician: Lemar Livings MD Electronically signed by Lemar Livings MD on 08/07/2020 at 8:17:46 AM.    Final    CT ANGIO HEAD CODE STROKE  Result Date: 07/26/2020 CLINICAL DATA:  Acute presentation with left body weakness and speech disturbance. EXAM: CT ANGIOGRAPHY HEAD AND NECK CT PERFUSION BRAIN TECHNIQUE: Multidetector CT imaging of the head and neck was performed using the standard protocol during bolus administration of intravenous contrast. Multiplanar  CT image reconstructions and MIPs were obtained to evaluate the vascular anatomy. Carotid stenosis measurements (when applicable) are obtained utilizing NASCET criteria, using the distal internal carotid diameter as the denominator. Multiphase CT imaging of the brain was performed following IV bolus contrast injection. Subsequent parametric perfusion maps were calculated using RAPID software. CONTRAST:  Not annotated COMPARISON:  Head CT earlier same day FINDINGS: CTA NECK FINDINGS Aortic arch: No aortic atherosclerotic calcification seen. No aneurysm or dissection. Branching pattern is normal with wide patency of the origins. Right carotid system: Common carotid artery widely patent to the bifurcation. No atherosclerotic disease at the carotid bifurcation. ICA widely patent. Left carotid system: Common carotid artery widely patent to the bifurcation. Carotid bifurcation is normal without soft or calcified plaque. ICA widely patent. Vertebral arteries: Both vertebral artery origins are widely patent. Both vertebral arteries are normal through the cervical region to the foramen magnum. Skeleton:  Ordinary cervical spondylosis. Other neck: No mass or lymphadenopathy. Upper chest: No active chest disease. 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 Review of the MIP images confirms the above findings CTA HEAD FINDINGS Anterior circulation: Complete occlusion of the right carotid terminus. Minimal flow visible in the right MCA territory. Right ACA receives it supply through the anterior communicating route. No left stenosis or occlusion. Right PCA arises from the carotid artery immediately proximal to the occlusion. Posterior circulation: Both vertebral arteries patent to the basilar. Atherosclerotic narrowing the distal vertebral arteries and of the basilar. Posterior circulation branch vessels are patent. Right PCA receives most of it supply from the  anterior circulation. Venous sinuses: Patent and normal. Anatomic variants: None significant. 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 Minimal flow seen in the right MCA territory. Right ACA territory appears normally perfused, probably due to a patent anterior communicating artery. 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 Large penumbra, 136 cc in the remainder of the right MCA territory. No aortic atherosclerotic disease seen. No atherosclerotic disease at either carotid bifurcation. Some atherosclerotic narrowing of the distal vertebral arteries and basilar artery. Preliminary report sent by text at 1238 hours. Discussed with stroke PA 1242 hours. Electronically Signed   By: Paulina Fusi M.D.   On: 07/26/2020 12:48   CT ANGIO NECK CODE STROKE  Result Date: 07/26/2020 CLINICAL DATA:  Acute presentation with left body weakness and speech disturbance. EXAM: CT ANGIOGRAPHY HEAD AND NECK CT PERFUSION BRAIN TECHNIQUE: Multidetector CT imaging of the head and neck was performed using the standard protocol during bolus administration of intravenous contrast. Multiplanar CT image reconstructions and MIPs were obtained to evaluate the vascular anatomy. Carotid stenosis measurements (when applicable) are obtained utilizing NASCET criteria, using the distal internal carotid diameter as the denominator. Multiphase CT imaging of the brain was performed following IV bolus contrast injection. Subsequent parametric perfusion maps were calculated using RAPID software. CONTRAST:  Not annotated COMPARISON:  Head CT earlier same day FINDINGS: CTA NECK FINDINGS Aortic arch: No aortic atherosclerotic calcification seen. No aneurysm or dissection. Branching pattern is normal with wide patency  of the origins. Right carotid system: Common carotid artery widely patent to the bifurcation. No atherosclerotic disease at the carotid bifurcation. ICA widely patent. Left carotid system: Common carotid artery widely patent to the bifurcation. Carotid bifurcation is normal without soft or calcified plaque. ICA widely patent. Vertebral arteries: Both vertebral artery origins are widely patent. Both vertebral arteries are normal through the cervical region to the foramen magnum. Skeleton: Ordinary cervical spondylosis. Other neck: No mass or lymphadenopathy. Upper chest: No active chest disease. 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 Review of the MIP images confirms the above findings CTA HEAD FINDINGS Anterior circulation: Complete occlusion of the right carotid terminus. Minimal flow visible in the right MCA territory. Right ACA receives it supply through the anterior communicating route. No left stenosis or occlusion. Right PCA arises from the carotid artery immediately proximal to the occlusion. Posterior circulation: Both vertebral arteries patent to the basilar. Atherosclerotic narrowing the distal vertebral arteries and of the basilar. Posterior circulation branch vessels are patent. Right PCA receives most  of it supply from the anterior circulation. Venous sinuses: Patent and normal. Anatomic variants: None significant. 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 Minimal flow seen in the right MCA territory. Right ACA territory appears normally perfused, probably due to a patent anterior communicating artery. 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 Large penumbra, 136 cc in the remainder of the  right MCA territory. No aortic atherosclerotic disease seen. No atherosclerotic disease at either carotid bifurcation. Some atherosclerotic narrowing of the distal vertebral arteries and basilar artery. Preliminary report sent by text at 1238 hours. Discussed with stroke PA 1242 hours. Electronically Signed   By: Paulina Fusi M.D.   On: 07/26/2020 12:48   IR ANGIO VERTEBRAL SEL SUBCLAVIAN INNOMINATE UNI R MOD SED  Result Date: 07/29/2020 INDICATION: Acute onset of left-sided hemiplegia, right gaze deviation and slurred speech.  Occluded right internal carotid artery supraclinoid segment, right middle cerebral artery and the right anterior cerebral artery.  EXAM: 1. EMERGENT LARGE VESSEL OCCLUSION THROMBOLYSIS (anterior CIRCULATION)  COMPARISON:  CT angiogram of the head and neck of July 26, 2020.  MEDICATIONS: Ancef 2 g IV antibiotic was administered within 1 hour of the procedure.  ANESTHESIA/SEDATION: General anesthesia  CONTRAST:  Isovue 300 approximately 100 mL  FLUOROSCOPY TIME:  Fluoroscopy Time: 38 minutes 36 seconds (1410 mGy).  COMPLICATIONS: None immediate.  TECHNIQUE: Following a full explanation of the procedure along with the potential associated complications, an informed witnessed consent was obtained the patient's spouse via an interpreter. The risks of intracranial hemorrhage of 10%, worsening neurological deficit, ventilator dependency, death and inability to revascularize were all reviewed in detail with the patient's spouse.  The patient was then put under general anesthesia by the Department of Anesthesiology at [facility].  The right groin was prepped and draped in the usual sterile fashion. Thereafter using modified Seldinger technique, transfemoral access into the right common femoral artery was obtained without difficulty. Over a 0.035 inch guidewire a 5 French Pinnacle sheath was inserted. Through this, and also over a 0.035 inch guidewire a 8 French 25 cm catheter  was advanced to the aortic arch region and selectively positioned in the innominate artery and right common carotid artery.  FINDINGS: Innominate arteriogram demonstrates the origin of the right subclavian artery and the right common carotid artery to be widely patent.  Moderate tortuosity is seen of the right common carotid artery proximally.  The right vertebral artery origin is widely patent.  The vessel is seen to opacify to the cranial skull base. The opacified portion of the basilar artery on the AP projection demonstrates patency with suggestion of a 50% stenosis of the right vertebrobasilar junction.  The right common carotid arteriogram demonstrates the right external carotid artery and its major branches to be widely patent.  The right internal carotid artery at the bulb to the cranial skull base is patent with moderate severe tortuosity involving the mid right M1 segment associated with a 50% stenosis.  The petrous and proximal cavernous segments are widely patent.  There is complete angiographic occlusion of the right internal carotid artery just distal to the origin of the right posterior communicating artery supplying the right posterior cerebral artery distribution.  PROCEDURE: The diagnostic JB 1 catheter in the right common carotid artery was exchanged over a 0.035 inch 300 cm Pollyann Kennedy  exchange guidewire for an 087 95 cm balloon guide catheter which had been prepped with 50% contrast and 50% heparinized saline infusion.  Guidewire was removed. Good aspiration obtained from the hub of the balloon guide catheter. A gentle contrast injection demonstrated no evidence of vasospasm, or of intraluminal filling defects.  Over a 0.014 inch standard Synchro micro guidewire, an 027 160 cm Marksman microcatheter inside of an 071 135 cm Zoom aspiration catheter was advanced without difficulty to the distal cavernous segment of the right internal carotid artery.  The micro guidewire was then gently  manipulated without difficulty and advanced into the inferior division of the right middle cerebral artery M2 M3 region followed by the microcatheter. The guidewire was removed. Mild resistance was again to aspiration which cleared with more proximal position of the microcatheter. A gentle control arteriogram performed through the microcatheter demonstrated safe position of tip of the microcatheter, which was connected to continuous heparinized saline infusion.  A 3 mm x 20 mm Solitaire X retrieval device was then advanced to the distal end of the microcatheter. The O ring on the delivery microcatheter was loosened. With slight forward gentle traction with the right hand on the delivery micro guidewire with the left hand the delivery microcatheter was retrieved unsheathing the retrieval device.  The 071 aspiration catheter was engaged in the occluded right middle cerebral artery M1 segment. Continued aspiration was applied using an aspiration device with proximal flow arrest in the right internal carotid artery, and aspiration using a 60 mL syringe at the hub of the balloon guide catheter for approximately 2-1/2 minutes.  Thereafter, the combination of the retrieval device, the microcatheter, aspiration catheter was retrieved and removed as aspiration was applied at the hub of the balloon guide catheter. Large clot and small pieces were seen from within the aspiration catheter.  A control arteriogram performed through the balloon guide following reversal of flow arrest in the right internal carotid artery demonstrated wide patency of the superior and inferior divisions of the right middle cerebral artery. A control arteriogram performed following infusion of 25 mcg of nitroglycerin intra-arterially now demonstrated significantly improved caliber and flow through the superior and inferior divisions. There continued be slow flow involving the M3 M4 branches of the anterior of peri insular branches consistent with  the site of the core seen on the CT perfusion studies.  A TICI 2C revascularization of the right MCA distribution had been achieved. The right anterior cerebral artery was now also widely patent with a TICI 3 revascularization.  Patency of the right posterior communicating artery and the right posterior cerebral artery territory was intact.  Control arteriogram performed through the balloon guide in the right common carotid artery demonstrated free flow in the right internal carotid artery extra cranially and intracranially.  This was then removed. The 8 French Pinnacle sheath in the right groin was replaced with an 8 French Angio-Seal closure device for hemostasis. The distal pulses remained Dopplerable in both the DPs and posterior tibial arteries bilaterally unchanged.  Post procedure CT scan of the brain demonstrated contrast blush in the right basal ganglia region. No gross mass effect or midline shift was noted.  The patient was left intubated on account of the patient's neurologic condition prior to intubation. She was then transported to the neuro ICU for post thrombectomy care.  IMPRESSION: Status post endovascular revascularization of occluded right internal carotid artery supraclinoid segment, the right middle cerebral and the right anterior cerebral artery with 1 pass with a 3  mm x 20 mm Solitaire X retrieval device, contact aspiration and proximal flow arrest achieving a TICI 2C revascularization.  PLAN: Follow-up as per referring MD.   Electronically Signed   By: Julieanne Cotton M.D.   On: 07/27/2020 14:02    Time Spent in minutes  30     Samantha Paul M.D on 08/17/2020 at 11:55 AM  To page go to [email or web address] - password [email or web address]

## 2020-08-17 NOTE — Progress Notes (Signed)
Inpatient Diabetes Program Recommendations  AACE/ADA: New Consensus Statement on Inpatient Glycemic Control (2015)  Target Ranges:  Prepandial:   less than 140 mg/dL      Peak postprandial:   less than 180 mg/dL (1-2 hours)      Critically ill patients:  140 - 180 mg/dL   Lab Results  Component Value Date   GLUCAP 124 (H) 08/17/2020   HGBA1C 8.7 (H) 07/27/2020    Review of Glycemic Control Results for Samantha Paul, Samantha Paul (MRN [id number]) as of 08/17/2020 16:33  Ref. Range 08/17/2020 08:14 08/17/2020 11:45 08/17/2020 15:37 08/17/2020 16:04  Glucose-Capillary Latest Ref Range: 70 - 99 mg/dL 932 (H) Novolog 8 units (5 units tube feed coverage + 2 units correction) 121 (H) Tube feed discontinued approx. 10:30 Novolog 8 units (5 units tube feed coverage + 2 units correction) 58 (L) Hypoglycemia protocol 124 (H)   Inpatient Diabetes Program Recommendations:   Received consult regarding patient having hypoglycemic event. Patient had tube feeding discontinued approx. 10:30 am per conversation with RN Haylie Clendenin and received Novolog 5 units tube feed coverage @ 12:20 pm along with Novolog 2 units Novolog correction.  Noted patient to have peg tube placed in am. While patient is NPO: -D/C Novolog tube feed coverage to assure patient does not receive -Decrease Novolog correction to sensitive 0-9 units q 4 hrs.  Thank you, Billy Fischer. Anitria Andon, RN, MSN, CDE  Diabetes Coordinator Inpatient Glycemic Control Team Team Pager [phone] (8am-5pm) 08/17/2020 4:39 PM

## 2020-08-17 NOTE — Progress Notes (Signed)
Fecal Management system came out while flushing tubing. RN attempted to reinsert two times without success. Will report to day shift RN.

## 2020-08-17 NOTE — Progress Notes (Signed)
Patient ID: Samantha Paul, female   DOB: [birth year], 74 y.o.   MRN: [id number] PEG scheduled 8/24. I ordered TF to be geld at [place].  [name], MD, MPH, FACS Please use [email or web address] to contact on call provider

## 2020-08-17 NOTE — TOC Progression Note (Signed)
Transition of Care [facility]) - Progression Note    Patient Details  Name: Samantha Paul MRN: [id number] Date of Birth: 05-30-1946  [facility] CM/SW Contact  Kermit Balo, RN Phone Number: 08/17/2020, 1:11 PM  Clinical Narrative:    Spouse still has not made decision for SNF rehab. CM encouraged him to have a decision by the am. Spouse voiced understanding. Interpreter scheduled for tomorrow and Wednesday.  TOC following.   Expected Discharge Plan: Skilled Nursing Facility Barriers to Discharge: Continued Medical Work up  Expected Discharge Plan and Services Expected Discharge Plan: Skilled Nursing Facility In-house Referral: Clinical Social Work Discharge Planning Services: CM Consult Post Acute Care Choice: Skilled Nursing Facility Living arrangements for the past 2 months: Single Family Home                                       Social Determinants of Health (SDOH) Interventions    Readmission Risk Interventions Readmission Risk Prevention Plan 07/03/2020 07/03/2020  Post Dischage Appt Complete -  Medication Screening Complete Complete  Transportation Screening Complete Complete

## 2020-08-18 ENCOUNTER — Inpatient Hospital Stay (HOSPITAL_COMMUNITY): Payer: Medicare Other | Admitting: Certified Registered"

## 2020-08-18 ENCOUNTER — Encounter (HOSPITAL_COMMUNITY)
Admission: EM | Disposition: A | Discharge status: Skilled Nursing Facility | Payer: Self-pay | Source: Home / Self Care | Attending: Neurology

## 2020-08-18 DIAGNOSIS — E7849 Other hyperlipidemia: Secondary | ICD-10-CM

## 2020-08-18 DIAGNOSIS — R531 Weakness: Secondary | ICD-10-CM

## 2020-08-18 HISTORY — PX: PEG PLACEMENT: SHX5437

## 2020-08-18 HISTORY — PX: ESOPHAGOGASTRODUODENOSCOPY: SHX5428

## 2020-08-18 LAB — CBC WITH DIFFERENTIAL/PLATELET
Abs Immature Granulocytes: 0.15 10*3/uL — ABNORMAL HIGH (ref 0.00–0.07)
Basophils Absolute: 0.1 10*3/uL (ref 0.0–0.1)
Basophils Relative: 1 %
Eosinophils Absolute: 0.2 10*3/uL (ref 0.0–0.5)
Eosinophils Relative: 2 %
HCT: 26.6 % — ABNORMAL LOW (ref 36.0–46.0)
Hemoglobin: 7.9 g/dL — ABNORMAL LOW (ref 12.0–15.0)
Immature Granulocytes: 1 %
Lymphocytes Relative: 12 %
Lymphs Abs: 1.3 10*3/uL (ref 0.7–4.0)
MCH: 22.8 pg — ABNORMAL LOW (ref 26.0–34.0)
MCHC: 29.7 g/dL — ABNORMAL LOW (ref 30.0–36.0)
MCV: 76.7 fL — ABNORMAL LOW (ref 80.0–100.0)
Monocytes Absolute: 1.4 10*3/uL — ABNORMAL HIGH (ref 0.1–1.0)
Monocytes Relative: 12 %
Neutro Abs: 7.9 10*3/uL — ABNORMAL HIGH (ref 1.7–7.7)
Neutrophils Relative %: 72 %
Platelets: 268 10*3/uL (ref 150–400)
RBC: 3.47 MIL/uL — ABNORMAL LOW (ref 3.87–5.11)
RDW: 22 % — ABNORMAL HIGH (ref 11.5–15.5)
WBC: 10.9 10*3/uL — ABNORMAL HIGH (ref 4.0–10.5)
nRBC: 0 % (ref 0.0–0.2)

## 2020-08-18 LAB — GLUCOSE, CAPILLARY
Glucose-Capillary: 129 mg/dL — ABNORMAL HIGH (ref 70–99)
Glucose-Capillary: 130 mg/dL — ABNORMAL HIGH (ref 70–99)
Glucose-Capillary: 149 mg/dL — ABNORMAL HIGH (ref 70–99)
Glucose-Capillary: 166 mg/dL — ABNORMAL HIGH (ref 70–99)
Glucose-Capillary: 68 mg/dL — ABNORMAL LOW (ref 70–99)
Glucose-Capillary: 81 mg/dL (ref 70–99)
Glucose-Capillary: 99 mg/dL (ref 70–99)

## 2020-08-18 LAB — CULTURE, BLOOD (ROUTINE X 2)
Culture: NO GROWTH
Culture: NO GROWTH
Special Requests: ADEQUATE
Special Requests: ADEQUATE

## 2020-08-18 SURGERY — EGD (ESOPHAGOGASTRODUODENOSCOPY)
Anesthesia: General

## 2020-08-18 MED ORDER — DEXTROSE 50 % IV SOLN: 25.0000 mL | Freq: Once | INTRAVENOUS | Status: AC

## 2020-08-18 MED ORDER — ONDANSETRON HCL 4 MG/2ML IJ SOLN
INTRAMUSCULAR | Status: DC | PRN
  Administered 2020-08-18: 4 mg via INTRAVENOUS

## 2020-08-18 MED ORDER — PROPOFOL 10 MG/ML IV BOLUS
INTRAVENOUS | Status: DC | PRN
  Administered 2020-08-18: 150 mg via INTRAVENOUS

## 2020-08-18 MED ORDER — PHENYLEPHRINE HCL (PRESSORS) 10 MG/ML IV SOLN
INTRAVENOUS | Status: DC | PRN
  Administered 2020-08-18: 40 ug via INTRAVENOUS
  Administered 2020-08-18: 80 ug via INTRAVENOUS
  Administered 2020-08-18 (×2): 40 ug via INTRAVENOUS

## 2020-08-18 MED ORDER — DEXTROSE 50 % IV SOLN
INTRAVENOUS | Status: AC
  Filled 2020-08-18: qty 50

## 2020-08-18 MED ORDER — DEXTROSE 50 % IV SOLN
INTRAVENOUS | Status: AC
  Administered 2020-08-18: 25 mL via INTRAVENOUS
  Filled 2020-08-18: qty 50

## 2020-08-18 MED ORDER — LACTATED RINGERS IV SOLN: INTRAVENOUS | Status: DC | PRN

## 2020-08-18 MED ORDER — FENTANYL CITRATE (PF) 100 MCG/2ML IJ SOLN
INTRAMUSCULAR | Status: DC | PRN
  Administered 2020-08-18: 50 ug via INTRAVENOUS

## 2020-08-18 MED ORDER — SUCCINYLCHOLINE CHLORIDE 20 MG/ML IJ SOLN
INTRAMUSCULAR | Status: DC | PRN
  Administered 2020-08-18: 120 mg via INTRAVENOUS

## 2020-08-18 NOTE — Progress Notes (Signed)
Physical Therapy Treatment Patient Details Name: Samantha Paul MRN: [id number] DOB: 02-29-1946 Today's Date: 08/18/2020    History of Present Illness 74 y.o. female with history of atrial fibrillation, HTN, DM and stroke, who presents with acute onset of right gaze deviation, left-sided hemiplegia and ?dense receptive and expressive aphasia. 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 Pt received tPA and underwent R ICA and MCA thrombectomy on 8/1. Follow-up MRI demonstrating moderate R MCA as well as R ACA and PCA infarcts with trace petechial hemorrhage. Plan for PEG 8/24    PT Comments    Patient overall more alert during session. As fatigued in sitting, began to close her eyes. Using RUE with rail to initiate/assist with rolling. Pt with RUE engaged once sitting EOB (using to support herself in sitting) with periods of min assist to maintain balance (for 2 seconds at a time); very delayed reaction to move/adjust RUE when LOB posteriorly or to her rt allowed (required max assist to recover her balance). On return to supine, assessed ability to follow simple commands with pt showing weak "thumbs up" 2 of 2 trials. OT arrived to work with pt and deferred further UE work to them.    Follow Up Recommendations  SNF     Equipment Recommendations  Other (comment) (TBD at next venue)    Recommendations for Other Services       Precautions / Restrictions Precautions Precautions: Fall Precaution Comments: dense Lt hemiplegic with Rt gaze preference    Mobility  Bed Mobility Overal bed mobility: Needs Assistance Bed Mobility: Rolling;Sidelying to Sit;Sit to Sidelying Rolling: Max assist;+2 for physical assistance Sidelying to sit: Total assist;+2 for safety/equipment;HOB elevated (from rt side trying to engage use of RUE)     Sit to sidelying: Max assist;+2 for physical assistance General bed mobility comments: pt grabbing rt  rail with RUE to assist with rolling to her rt; grabbed rail with RUE when scooting up to [facility]  Transfers                    Ambulation/Gait                 Stairs             Wheelchair Mobility    Modified Rankin (Stroke Patients Only) Modified Rankin (Stroke Patients Only) Pre-Morbid Rankin Score: No symptoms Modified Rankin: Severe disability     Balance Overall balance assessment: Needs assistance Sitting-balance support: Feet unsupported;Bilateral upper extremity supported (pt very short and feet not reaching the floor) Sitting balance-Leahy Scale: Poor Sitting balance - Comments: pt with RUE engaged once sitting EOB (using to support herself in sitting); very delayed reaction to move/adjust RUE when LOB posteriorly or to her rt allowed (required max assist to recover her balance) Postural control: Posterior lean;Right lateral lean                                  Cognition Arousal/Alertness:  (awake on arrival; frequent eye closing) Behavior During Therapy: Flat affect Overall Cognitive Status: Difficult to assess Area of Impairment: Following commands                       Following Commands: Follows one step commands with increased time (with RUE )       General Comments: interpreter did not show  up at assigned time; husband assisted--he tends to gesture and facilitate and not provide purely verbal commands      Exercises      General Comments General comments (skin integrity, edema, etc.): Husband present; very attentive and supportive      Pertinent Vitals/Pain Pain Assessment: Faces Faces Pain Scale: No hurt    Home Living                      Prior Function            PT Goals (current goals can now be found in the care plan section) Acute Rehab PT Goals Patient Stated Goal: pt unable Time For Goal Achievement: 08/25/20 Potential to Achieve Goals: Fair Progress towards PT goals: Progressing  toward goals (slowly beginning to use rail more functionally)    Frequency    Min 2X/week      PT Plan Current plan remains appropriate    Co-evaluation              AM-PAC PT "6 Clicks" Mobility   Outcome Measure  Help needed turning from your back to your side while in a flat bed without using bedrails?: A Lot Help needed moving from lying on your back to sitting on the side of a flat bed without using bedrails?: A Lot Help needed moving to and from a bed to a chair (including a wheelchair)?: Total Help needed standing up from a chair using your arms (e.g., wheelchair or bedside chair)?: Total Help needed to walk in hospital room?: Total Help needed climbing 3-5 steps with a railing? : Total 6 Click Score: 8    End of Session Equipment Utilized During Treatment: Oxygen Activity Tolerance: Patient limited by fatigue Patient left: in bed;with call bell/phone within reach;with family/visitor present;Other (comment) (OT in to work with pt at bed level after PT) Nurse Communication: Mobility status PT Visit Diagnosis: Hemiplegia and hemiparesis;Difficulty in walking, not elsewhere classified (R26.2) Hemiplegia - Right/Left: Left Hemiplegia - dominant/non-dominant: Non-dominant Hemiplegia - caused by: Cerebral infarction     Time: 1030-1051 PT Time Calculation (min) (ACUTE ONLY): 21 min  Charges:  $Neuromuscular Re-education: 8-22 mins                      Jerolyn Center, PT Pager [phone]    Zena Amos 08/18/2020, 11:09 AM

## 2020-08-18 NOTE — Progress Notes (Signed)
STROKE TEAM PROGRESS NOTE   INTERVAL HISTORY No family at bedside. Met husband in the hallway on my way out. Pt had PEG done today with trauma service. Tolerating well, now on TF. She again had hypoglycemic event earlier today. DM coordinator on board.    Vitals:   08/17/20 2300 08/18/20 0300 08/18/20 0313 08/18/20 0826  BP: (!) 141/74  130/85 132/80  Pulse: (!) 56  74 81  Resp: _0 Temp: 98 F (36.7 C)  97.9 F (36.6 C) 98.2 F (36.8 C)  TempSrc: Oral  Oral Axillary  SpO2: 100%  100% 100%  Weight:      Height:       CBC:  Recent Labs  Lab 08/13/20 1634 08/13/20 2200 08/17/20 0844 08/18/20 0258  WBC 49.9*   < > 10.1 10.9*  NEUTROABS 44.4*  --   --  7.9*  HGB 6.8*   < > 7.5* 7.9*  HCT 23.5*   < > 25.7* 26.6*  MCV 73.9*   < > 76.5* 76.7*  PLT 399   < > 247 268   < > = values in this interval not displayed.   Basic Metabolic Panel:  Recent Labs  Lab 08/16/20 0203 08/17/20 0220  NA 133* 136  K 4.3 4.3  CL 104 107  CO2 22 21*  GLUCOSE 140* 89  BUN 31* 31*  CREATININE 1.30* 1.31*  CALCIUM 8.3* 8.4*   IMAGING past 24 hours No results found.  PHYSICAL EXAM  General - Well nourished, well developed elderly Asian lady, mild respiratory distress with prolonged phase of expiration.  Ophthalmologic - fundi not visualized due to noncooperation.  Cardiovascular - irregularly irregular heart rate and rhythm.  Neuro -  Eyes initially closed, however easily open with voice and maintained opening. Nonverbal, not following commands.  Eyes in right gaze position, not able to cross midline. Not blinking to visual threat on the left but blinking on the right, PERRL. Corneal reflex present, gag and cough present. Left lower facial weakness. Tongue protrusion not cooperative. Cortrak in place. On pain stimulation, active movement of RUE and RLE with RUE at least 4/5 and RLE at least 3-/5. LUE 0/5 and LLE 2-/5 withdraw on pain. DTR 1+ and bilateral positive babinski.  Sensation not cooperative but brisk response to painful stimulation, coordination not cooperative and gait not tested.   ASSESSMENT/PLAN Ms. Karren Araque is a 74 y.o. female with history of atrial fibrillation, HTN, DM and stroke presenting with right gaze deviation, left-sided hemiplegia and dense receptive and expressive aphasia. She received tPA 07/26/2020 at 1259. Taken to IR for R ICA T occlusion.  Stroke:  R MCA and ACA infarct due to R tICA occlusion s/p tPA and IR w/ [id number], embolic secondary to known AF but not on AC for at least a week  Code Stroke CT head No acute abnormality.     CTA head & neck R tICA occlusion w/ min MCA flow. Some narrowing B VA and BA.   CT perfusion R frontal core 16 cc with penumbra 136 R MCA territory  Cerebral angio RT ICA T occlusion ->TICI 2C revascularization of RT MCA and a TICI 3 revascularization of RT ACA .  CT head post IR - new R frontal lobe hyper and hypointensity (infarct, edema, petechial hemorrhage)  MRI right MCA and ACA multifocal large infarcts with petechial hemo  CT repeat stable right MCA multifocal infarcts, minimal MLS.  2D Echo 07/03/2020 EF 65-70%. No source of  embolus   LDL 90  HgbA1c 12.7  VTE prophylaxis - SCDs   Eliquis (apixaban) daily prior to admission, now on aspirin 81 mg. Will consider [facility] tomorrow if hemodynamically stable  Therapy recommendations: SNF  Disposition:  Pending  - TOC working with husband on facility decision  Sepsis likely due to C. difficile colitis w/ diarrhea  Code sepsis 8/19   WBC 20.2->22.3->21.3->20.1->49.9->35.5->21.7->12.8->10.1->10.9  TM 102.5->afebrile  Lactic acid 1.5  C. diff Positive   Blood Cx 8/19 NGTD  UA > 50 RBC, 30 Protein. No WBC or bacteria  Urine Cx - 8/19 - no growth  CT chest, abd, pelvis - 08/14/20 - concerning for acute pyelonephritis   Rectal tube out 8/23  Flagyl 8/19>>  Cefepime 8/19>>  Vancomycin 8/19>>  Microcytic anemia   Hgb  8.1->7.5->7.7->6.6 - 6.8 - PRBC - 8.1->7.7->7.3->7.3->7.5->7.9  FOB positive  Fe 15, Sat 5, Fer 634  Rectal tube remains in place  On iron pills  Atrial Fibrillation w/ RVR  Home anticoagulation:  Eliquis (apixaban) daily - not on for at least a week prior to admission   On diltiazem 240, metoprolol 12.5 bid  PTA  On diltiazem 23m Q6  On metoprolol 50 bid  On ASA 81  Consider AC tomorrow if hemodynamically stable  Hyperlipidemia  Home meds:  crestor 20, resumed in hospital  LDL 90, at goal < 70  Continue statin at discharge  Diabetes type II Uncontrolled Hypoglycemia episode 8/22  Home meds:  lantus 20 daily, metformin 500 bid  HgbA1c 12.7, goal < 7.0  CBGs  SSI  On lantus 22, novolog 5U Q4h -> on hold around PEG placement  Hypoglycemia during night 8/22 Glucose 68 -> D50 -> 154  Hypoglycemia 8/23 pm glucose 58 -> D 50  Hypoglycemia 8/24 12pm glucose 68 -> D50  DM coordinator following w/ recs around PEG placement  Dysphagia . Secondary to stroke . NPO . Cortrak removed post PEG . On TF _0  . PEG placed 8/24 . Speech on board   AKI  Creatinine 1.37->1.31->1.28->1.30->1.31  On TF @ 50 + Pro Source 45 ml daily  Off IVF  Hyponatremia, resolved  Na - 132->131->133->136  Close monitoring  Hx of stroke  06/2018 left pontine infarct likely secondary to small vessel disease source. CTA head and neck neg. EF 65-70%. LDL 140 and A1C 12.3. DAPT x 3 weeks and lipitor 40.  Other Stroke Risk Factors  Advanced age  Other Active Problems  Urinary retention. Possible pyelonephritis on CT abd. UA w/ large hgb. Urine Cx - 8/19 - no growth  Hospital day # 223  JRosalin Hawking MD PhD Stroke Neurology 08/18/2020 11:29 AM  To contact Stroke Continuity provider, please refer to [email or web address] After hours, contact General Neurology

## 2020-08-18 NOTE — Progress Notes (Signed)
Patient CBG 68. Patient NPO. Dr. Caleb Popp paged for intervention. Will continue to monitor. Melony Overly, RN

## 2020-08-18 NOTE — Progress Notes (Signed)
Patient ID: Samantha Paul, female   DOB: [birth year], 74 y.o.   MRN: [id number] 23 Days Post-Op   Subjective: Non-verbal  ROS negative except as listed above. Objective: Vital signs in last 24 hours: Temp:  [97.4 F (36.3 C)-98.3 F (36.8 C)] 98.2 F (36.8 C) (08/24 0826) Pulse Rate:  [56-100] 81 (08/24 0826) Resp:  [14-20] 18 (08/24 0826) BP: (110-141)/(38-85) 132/80 (08/24 0826) SpO2:  [100 %] 100 % (08/24 0826) Last BM Date: 08/16/20  Intake/Output from previous day: 08/23 0701 - 08/24 0700 In: 1037.1 [I.V.:37.1; IV Piggyback:1000] Out: 2850 [Urine:2850] Intake/Output this shift: No intake/output data recorded.  GI: soft, NT, ND  Lab Results: CBC  Recent Labs    08/17/20 0844 08/18/20 0258  WBC 10.1 10.9*  HGB 7.5* 7.9*  HCT 25.7* 26.6*  PLT 247 268   BMET Recent Labs    08/16/20 0203 08/17/20 0220  NA 133* 136  K 4.3 4.3  CL 104 107  CO2 22 21*  GLUCOSE 140* 89  BUN 31* 31*  CREATININE 1.30* 1.31*  CALCIUM 8.3* 8.4*   PT/INR No results for input(s): LABPROT, INR in the last 72 hours. ABG No results for input(s): PHART, HCO3 in the last 72 hours.  Invalid input(s): PCO2, PO2  Studies/Results: DG CHEST PORT 1 VIEW  Result Date: 08/16/2020 CLINICAL DATA:  74 year old female with history of stroke. EXAM: PORTABLE CHEST 1 VIEW COMPARISON:  Chest x-ray 08/13/2020. FINDINGS: Feeding tube extending into the stomach with tip coiled back in the proximal stomach. Lung volumes are normal. No consolidative airspace disease. No pleural effusions. No evidence of pulmonary edema. No pneumothorax. No suspicious appearing pulmonary nodules or masses are noted. Mild cardiomegaly. Upper mediastinal contours are within normal limits allowing for patient positioning. Surgical clips project over the right upper quadrant of the abdomen, likely from prior cholecystectomy. IMPRESSION: 1. Tip of feeding tube is in the proximal stomach. 2. No radiographic evidence of acute  cardiopulmonary disease. 3. Mild cardiomegaly. Electronically Signed   By: Trudie Reed M.D.   On: 08/16/2020 10:09    Anti-infectives: Anti-infectives (From admission, onward)   Start     Dose/Rate Route Frequency Ordered Stop   08/15/20 0500  vancomycin (VANCOCIN) IVPB 1000 mg/200 mL premix  Status:  Discontinued        1,000 mg 200 mL/hr over 60 Minutes Intravenous Every 36 hours 08/13/20 1657 08/16/20 1255   08/14/20 2000  vancomycin (VANCOCIN) 50 mg/mL oral solution 500 mg        500 mg Per Tube Every 6 hours 08/14/20 1820 08/28/20 0159   08/14/20 0730  vancomycin (VANCOCIN) 50 mg/mL oral solution 500 mg  Status:  Discontinued        500 mg Oral Every 6 hours 08/14/20 0649 08/14/20 1820   08/14/20 0730  metroNIDAZOLE (FLAGYL) IVPB 500 mg        500 mg 100 mL/hr over 60 Minutes Intravenous Every 8 hours 08/14/20 0649 08/28/20 0729   08/13/20 1700  vancomycin (VANCOREADY) IVPB 1250 mg/250 mL        1,250 mg 166.7 mL/hr over 90 Minutes Intravenous  Once 08/13/20 1656 08/13/20 1958   08/13/20 1630  ceFEPIme (MAXIPIME) 2 g in sodium chloride 0.9 % 100 mL IVPB        2 g 200 mL/hr over 30 Minutes Intravenous Every 12 hours 08/13/20 1622     08/13/20 1600  ampicillin-sulbactam (UNASYN) 1.5 g in sodium chloride 0.9 % 100 mL IVPB  Status:  Discontinued  1.5 g 200 mL/hr over 30 Minutes Intravenous Every 8 hours 08/13/20 1447 08/13/20 1558   08/06/20 1630  cefTRIAXone (ROCEPHIN) 2 g in sodium chloride 0.9 % 100 mL IVPB        2 g 200 mL/hr over 30 Minutes Intravenous Every 24 hours 08/06/20 1325 08/09/20 1701   08/03/20 1630  cefTRIAXone (ROCEPHIN) 2 g in sodium chloride 0.9 % 100 mL IVPB  Status:  Discontinued        2 g 200 mL/hr over 30 Minutes Intravenous Every 24 hours 08/03/20 1547 08/06/20 1325   08/01/20 1200  piperacillin-tazobactam (ZOSYN) IVPB 3.375 g  Status:  Discontinued        3.375 g 12.5 mL/hr over 240 Minutes Intravenous Every 8 hours 08/01/20 1134 08/03/20 1509    07/26/20 1336  ceFAZolin (ANCEF) 2-4 GM/100ML-% IVPB       Note to Pharmacy: [facility]   : cabinet override      07/26/20 1336 07/27/20 0144      Assessment/Plan: For PEG today in Endo. Consent on chart. TF have been held.  LOS: 23 days    [name], MD, MPH, FACS Trauma & General Surgery Use [email or web address] to contact on call provider  08/18/2020

## 2020-08-18 NOTE — Op Note (Signed)
[facility] Patient Name: Samantha GuttingHpot Chandler Procedure Date : 08/18/2020 MRN: [id number] Attending MD: Violeta GelinasBurke Chloeanne Paul , MD Date of Birth: 04/24/1946 CSN: [id number] Age: 74 Admit Type: Inpatient Procedure:                Upper GI endoscopy Indications:              Place PEG because patient is unable to eat due to                            stroke (CVA) Providers:                Violeta GelinasBurke Sohum Delillo, MD, Bailey MechLiz Simaan, PA-C, Norman ClayLisa Nunn,                            RN, Brion AlimentShayla Proctor, Technician Referring MD:              Medicines:                General Anesthesia Complications:            No immediate complications. Estimated Blood Loss:     Estimated blood loss: none. Procedure:                Pre-Anesthesia Assessment:                           - Prior to the procedure, a History and Physical                            was performed, and patient medications and                            allergies were reviewed. The patient is unable to                            give consent secondary to the patient's altered                            mental status. The risks and benefits of the                            procedure and the sedation options and risks were                            discussed with the patient's spouse. All questions                            were answered and informed consent was obtained.                            Patient identification and proposed procedure were                            verified by the physician, the nurse, the  anesthetist and the technician in the procedure                            room. ASA Grade Assessment: III - A patient with                            severe systemic disease. After reviewing the risks                            and benefits, the patient was deemed in                            satisfactory condition to undergo the procedure.                            The anesthesia plan was to use general  anesthesia.                            Immediately prior to administration of medications,                            the patient was re-assessed for adequacy to receive                            sedatives. The heart rate, respiratory rate, oxygen                            saturations, blood pressure, adequacy of pulmonary                            ventilation, and response to care were monitored                            throughout the procedure. The physical status of                            the patient was re-assessed after the procedure.                           After obtaining informed consent, the endoscope was                            passed under direct vision. Throughout the                            procedure, the patient's blood pressure, pulse, and                            oxygen saturations were monitored continuously. The                            GIF-H190 ([id number] Olympus gastroscope was  introduced through the mouth, and advanced to the                            duodenal bulb. The upper GI endoscopy was                            accomplished without difficulty. The patient                            tolerated the procedure well. Scope In: Scope Out: Findings:      No gross lesions were noted in the esophagus.      No gross lesions were noted in the stomach. Placement of an externally       removable PEG with no T-fasteners was successfully completed. The       external bumper was at the 3.5 cm marking on the tube.      No gross lesions were noted in the duodenal bulb. Impression:               - No gross lesions in esophagus.                           - No gross lesions in the stomach.                           - No gross lesions in the duodenal bulb.                           - An externally removable PEG placement was                            successfully completed.                           - No specimens  collected. Recommendation:           meds now, resume tube feeds in 4h. Binder at all                            times to protect PEG. Procedure Code(s):        --- Professional ---                           [phone], Esophagogastroduodenoscopy, flexible,                            transoral; with directed placement of percutaneous                            gastrostomy tube Diagnosis Code(s):        --- Professional ---                           Y09.983, Other sequelae of cerebral infarction                           R63.3, Feeding difficulties  Z43.1, Encounter for attention to gastrostomy CPT copyright 2019 American Medical Association. All rights reserved. The codes documented in this report are preliminary and upon coder review may  be revised to meet current compliance requirements. [name], MD 08/18/2020 1:42:45 PM This report has been signed electronically. Number of Addenda: 0

## 2020-08-18 NOTE — Transfer of Care (Signed)
Immediate Anesthesia Transfer of Care Note  Patient: Samantha Paul  Procedure(s) Performed: ESOPHAGOGASTRODUODENOSCOPY (EGD) (N/A ) PERCUTANEOUS ENDOSCOPIC GASTROSTOMY (PEG) PLACEMENT (N/A )  Patient Location: Endoscopy Unit  Anesthesia Type:General  Level of Consciousness: sedated  Airway & Oxygen Therapy: Patient connected to face mask oxygen  Post-op Assessment: Post -op Vital signs reviewed and stable  Post vital signs: stable  Last Vitals:  Vitals Value Taken Time  BP    Temp    Pulse    Resp    SpO2      Last Pain:  Vitals:   08/18/20 1230  TempSrc: Temporal  PainSc:          Complications: No complications documented.

## 2020-08-18 NOTE — Anesthesia Postprocedure Evaluation (Signed)
Anesthesia Post Note  Patient: Samantha Paul  Procedure(s) Performed: ESOPHAGOGASTRODUODENOSCOPY (EGD) (N/A ) PERCUTANEOUS ENDOSCOPIC GASTROSTOMY (PEG) PLACEMENT (N/A )     Patient location during evaluation: PACU Anesthesia Type: General Level of consciousness: awake and alert Pain management: pain level controlled Vital Signs Assessment: post-procedure vital signs reviewed and stable Respiratory status: spontaneous breathing, nonlabored ventilation, respiratory function stable and patient connected to nasal cannula oxygen Cardiovascular status: blood pressure returned to baseline and stable Postop Assessment: no apparent nausea or vomiting Anesthetic complications: no   No complications documented.  Last Vitals:  Vitals:   08/18/20 1410 08/18/20 1420  BP: 106/65 (!) 116/100  Pulse: (!) 110 73  Resp: (!) 22 (!) 24  Temp:    SpO2: 100% 100%    Last Pain:  Vitals:   08/18/20 1358  TempSrc: Temporal  PainSc:                  Mitchell Iwanicki

## 2020-08-18 NOTE — Anesthesia Preprocedure Evaluation (Signed)
Anesthesia Evaluation  Patient identified by MRN, date of birth, ID band  Reviewed: Allergy & Precautions, NPO status , Patient's Chart, lab work & pertinent test results  Airway    Neck ROM: Limited  Mouth opening: Limited Mouth Opening Comment: Unable to assess due to stroke Dental  (+) Teeth Intact   Pulmonary neg pulmonary ROS,    breath sounds clear to auscultation       Cardiovascular Exercise Tolerance: Poor hypertension, + dysrhythmias Atrial Fibrillation  Rhythm:Irregular Rate:Abnormal     Neuro/Psych CVA, Residual Symptoms negative psych ROS   GI/Hepatic negative GI ROS,   Endo/Other  diabetes, Poorly Controlled, Type 2, Insulin Dependent  Renal/GU Renal disease  negative genitourinary   Musculoskeletal negative musculoskeletal ROS (+)   Abdominal   Peds  Hematology  (+) anemia , 7.9   Anesthesia Other Findings Copious oral secretions  Reproductive/Obstetrics                            Anesthesia Physical Anesthesia Plan  ASA: IV  Anesthesia Plan: General   Post-op Pain Management:    Induction: Intravenous  PONV Risk Score and Plan: 3 and Ondansetron, Dexamethasone, TIVA, Treatment may vary due to age or medical condition and Propofol infusion  Airway Management Planned: Oral ETT  Additional Equipment:   Intra-op Plan:   Post-operative Plan:   Informed Consent: I have reviewed the patients History and Physical, chart, labs and discussed the procedure including the risks, benefits and alternatives for the proposed anesthesia with the patient or authorized representative who has indicated his/her understanding and acceptance.     Interpreter used for [employer] Discussed with:   Anesthesia Plan Comments: (Copious secretions and inability of patient to follow commands to open her mouth. Plan for GETA to prevent aspiration and to assist with mouth opening. Plan  discussed with husband and interpreter. Tanna Furry, MD 08/18/20 )        Anesthesia Quick Evaluation

## 2020-08-18 NOTE — Progress Notes (Signed)
SLP Cancellation Note  Patient Details Name: Tiyah Zelenak MRN: [id number] DOB: January 26, 1946   Cancelled treatment:       Reason Eval/Treat Not Completed: Patient at procedure or test/unavailable; pt off the floor for PEG placement; will follow up for PO readiness as able   Chandra Feger E Kenae Lindquist MA, CCC-SLP  08/18/2020, 12:33 PM

## 2020-08-18 NOTE — Progress Notes (Signed)
Daily Progress Note   Patient Name: Samantha Paul       Date: 08/18/2020 DOB: 1946/09/08  Age: 74 y.o. MRN#: [id number] Attending Physician: Desiree Hane, MD Primary Care Physician: Elwyn Reach, MD Admit Date: 07/26/2020   Patient Profile/HPI:  74 y.o.femalewith past medical history of previous stroke, uncontrolled DM2, and atrial fibrillation on Eliquiswho was admitted on 8/1/2021with left sided weakness and expressive aphasia. She was found to have a right sided CVA in her MCA and ACA. She received TPA. She underwent thrombectomy on 8/1. She is  with dense left hemiplegia.  She does attempt to work with therapies.  She remains weak, unable to support herself nutritionally.  She is for PEG placement today.  Plan is for disposition to SNF for continued rehab.  Family face treatment option decisions, advanced directive decisions and anticipatory care needs.  Reason for Consultation/Follow-up: To discuss complex medical decision making related to patient's goals of care.    Conversation/Education/GOCs  As scheduled I met with husband/ Y-Yel R'Mah and  2 of his sons along with  interpretor/ [employer]  c# [phone], language line # [phone] for continued education regarding current medical situation; diagnosis, prognosis, goals of care, disposition and anticipatory care needs.    Education had regarding the best case and worst case scenario in a patient with serious stroke and residual deficits.   At this time family is open to all offered and available medical interventions to prolong life.  They are hopeful for improvement   Education offered on the difference between an aggressive medical intervention path and a palliative comfort path for this patient at this time in this  situation.  Discussed with family  the importance of continued conversation with each other  and the  medical providers at the SNFregarding overall plan of care and treatment options,  ensuring decisions are within the context of the patients values and GOCs.  Strongly encouraged family to request interpreter for any communications  Questions and concerns answered to the best of my ability   Length of Stay: 23   Vital Signs: BP 132/80 (BP Location: Left Arm)   Pulse 81   Temp 98.2 F (36.8 C) (Axillary)   Resp 18   Ht _0  (1.575 m)   Wt 72.6 kg   SpO2 100%   BMI  29.27 kg/m  SpO2: SpO2: 100 % O2 Device: O2 Device: Room Air O2 Flow Rate: O2 Flow Rate (L/min): 2 L/min       Palliative Assessment/Data:  20%     Palliative Care Plan     Code Status:  Full code  Encouraged patient/family to consider DNR/DNI status understanding evidenced based poor outcomes in similar hospitalized patient, as the cause of arrest is likely associated with advanced chronic illness rather than an easily reversible acute cardio-pulmonary event.  Prognosis:  Unable to determine   Discharge Planning: SNF for rehab   Discussed with Dr  Lonny Prude Total time spent:  60  min.     Greater than 50%  of this time was spent counseling and coordinating care related to the above assessment and plan.  Wadie Lessen NP  Palliative Medicine Team Team Phone # [phone] Pager [phone]    Please contact Palliative MedicineTeam phone at [phone] for questions and concerns between 7 am - 7 pm.   Please see AMION for individual provider pager numbers.

## 2020-08-18 NOTE — Progress Notes (Signed)
[facility]  PROGRESS NOTE  Samantha Paul ONG:[id number] DOB: 06-May-1946 DOA: 07/26/2020 PCP: Rometta Emery, MD Admit date - 07/26/2020   Admitting Physician Caryl Pina, MD  Outpatient Primary MD for the patient is Rometta Emery, MD  LOS - 23 Brief Narrative   Samantha Paul is a 74 y.o. year old female with medical history significant for atrial fibrillation with poor adherence to Eliquis, diabetes, HTN, prior CVA who presented on 07/26/2020 with acute onset right gaze deviation, left-sided hemiplegia receptive/expressive aphasia after unwitnessed fall heard by her husband who found her in the kitchen with the above findings.  Code stroke was called and CT head showed no acute findings, but CTA head/neck showed embolic occlusion of the right carotid with minimal flow seen in the right MCA territory with large penumbra remainder of the right MCA territory.  Patient was admitted to neuro ICU and underwent TPA thrombectomy patient was intubated for revascularization procedure and required prolonged mechanical ventilation due to poor mentation before successful extubation on 8/14.  Hospital course was also complicated by Klebsiella pneumonia (respiratory culture on 8/8) requiring course of Zosyn followed by ceftriaxone for total of 9 days (8/7-8/15)  [facility] was consulted on 8/19 in the setting of code sepsis as patient was febrile with T-max of one 2.5, tachycardic, tachypneic, WBC of 49.9.  She was empirically started on vancomycin and cefepime for HCAP.  C. difficile was sent given diarrhea and was found to be positive so patient was started on oral vancomycin and IV Flagyl.  In work-up CT abdomen also showed concern for potential pyelonephritis, but UA was essentially unremarkable.    Subjective  Plan for PEG tube today  A & P  Sepsis secondary to C. difficile colitis, improving.  Afebrile greater than 48 hours, blood cultures remain unremarkable x48 hours, leukocytosis resolved, sepsis physiology  resolved -Continue oral vancomycin, IV Flagyl -DC IV vancomycinon 8/23 -On IV cefepime given query for potential pyelonephritis on CT abdomen on 8/20,  -Continue monitor stool output  Possible pyelonephritis. UA on 8/19 only shows large hgbb and budding yeast. No growth on urine culture. Patient was previously treated for klebsiella pneumonia with IV ceftriaxone. CT abd on 8/20 mentioned concerning perinephric fat stranding concerning for acute pylelonephritis. Currently no culture data and unable to asses urinary symptoms given mentation after CVA --currently on IV cefepime(given hospitalization), at risk for MDR given recent broad spectrum antibiotics earler in hospital stay--continue for total 7 day course  Acute right MCA stroke right ICA total occlusion, Status post TPA and revascularization by IR on 8/1 Dysphagia, secondary to acute CVA Patient has dense left hemiplegia and unable to handle oral secretions or support herself nutritionally.  Continues to have poor ability to clear oral secretions, at high risk for aspiration pneumonia.  Chest x-ray this a.m. showed cardiomegaly with no edema, no opacities. -Continue aspirin statin -Currently n.p.o. with core track feeding tube as recommended by ST,  -Surgery plans for PEG tube on 8/24 -Palliative care has been assisting with goals of care discussions  Atrial fibrillation, currently rate controlled.  Patient was nonadherent with home Eliquis prior to admission -Diltiazem 90 mg every 6 hours -Lopressor 50 mg twice daily -Neurology recommends holding off on home Eliquis given large infarct, petechial hemorrhaging, and eventual need for PEG procedure  Acute on chronic deficiency anemia table, stable. Iron panel consistent with deficiency. Acutely worsened in setting of c.diff colitis. Hgb stable at 7.3 -Oral iron 3 times daily, via core track --transfuse to  keep hgb goal > 7  Type 2 diabetes, controlled. A1c 8.7. FBG at goal -Holding home  Metformin -Lantus 22 units daily -NovoLog 5 units every 4 hours for 2 weeks protocol  HLD, stable on-Crestor   Family Communication  : Husband updated on 8/23 via assistance from translator present  Code Status :  FULL CODE  Disposition Plan  :  Patient is from home. Anticipated d/c date:  Greater than 3 days. Barriers to d/c or necessity for inpatient status:  Currently requiring IV antibiotics for C. difficile colitis and likely pyelonephritis, close monitoring of feeding/neurologic status/respiratory status  Procedures  :   8/4 Cortrak 8/1 revascularization of right ACA by IR 8/24 PEG tube placement  DVT Prophylaxis  :   SCDs   Lab Results  Component Value Date   PLT 268 08/18/2020    Diet :  Diet Order    None       Inpatient Medications Scheduled Meds: .  stroke: mapping our early stages of recovery book   Does not apply Once  . aspirin  81 mg Per Tube Daily  . bethanechol  10 mg Per Tube TID  . chlorhexidine  15 mL Mouth Rinse BID  . Chlorhexidine Gluconate Cloth  6 each Topical Daily  . diltiazem  90 mg Per Tube Q6H  . feeding supplement (PROSource TF)  45 mL Per Tube Daily  . ferrous sulfate  300 mg Per Tube TID  . insulin aspart  0-9 Units Subcutaneous Q4H  . insulin glargine  22 Units Subcutaneous Daily  . mouth rinse  15 mL Mouth Rinse q12n4p  . metoprolol tartrate  50 mg Per Tube BID  . rosuvastatin  20 mg Per Tube Daily  . vancomycin  500 mg Per Tube Q6H   Continuous Infusions: . sodium chloride Stopped (08/18/20 0449)  . ceFEPime (MAXIPIME) IV 2 g (08/18/20 0449)  . feeding supplement (JEVITY 1.2 CAL) 50 mL/hr at 08/15/20 1500  . metronidazole 500 mg (08/18/20 1001)   PRN Meds:.sodium chloride, acetaminophen **OR** acetaminophen (TYLENOL) oral liquid 160 mg/5 mL **OR** acetaminophen, senna-docusate  Antibiotics  :   Anti-infectives (From admission, onward)   Start     Dose/Rate Route Frequency Ordered Stop   08/15/20 0500  vancomycin  (VANCOCIN) IVPB 1000 mg/200 mL premix  Status:  Discontinued        1,000 mg 200 mL/hr over 60 Minutes Intravenous Every 36 hours 08/13/20 1657 08/16/20 1255   08/14/20 2000  vancomycin (VANCOCIN) 50 mg/mL oral solution 500 mg        500 mg Per Tube Every 6 hours 08/14/20 1820 08/28/20 0159   08/14/20 0730  vancomycin (VANCOCIN) 50 mg/mL oral solution 500 mg  Status:  Discontinued        500 mg Oral Every 6 hours 08/14/20 0649 08/14/20 1820   08/14/20 0730  metroNIDAZOLE (FLAGYL) IVPB 500 mg        500 mg 100 mL/hr over 60 Minutes Intravenous Every 8 hours 08/14/20 0649 08/28/20 0729   08/13/20 1700  vancomycin (VANCOREADY) IVPB 1250 mg/250 mL        1,250 mg 166.7 mL/hr over 90 Minutes Intravenous  Once 08/13/20 1656 08/13/20 1958   08/13/20 1630  ceFEPIme (MAXIPIME) 2 g in sodium chloride 0.9 % 100 mL IVPB        2 g 200 mL/hr over 30 Minutes Intravenous Every 12 hours 08/13/20 1622 08/19/20 2359   08/13/20 1600  ampicillin-sulbactam (UNASYN) 1.5 g in sodium  chloride 0.9 % 100 mL IVPB  Status:  Discontinued        1.5 g 200 mL/hr over 30 Minutes Intravenous Every 8 hours 08/13/20 1447 08/13/20 1558   08/06/20 1630  cefTRIAXone (ROCEPHIN) 2 g in sodium chloride 0.9 % 100 mL IVPB        2 g 200 mL/hr over 30 Minutes Intravenous Every 24 hours 08/06/20 1325 08/09/20 1701   08/03/20 1630  cefTRIAXone (ROCEPHIN) 2 g in sodium chloride 0.9 % 100 mL IVPB  Status:  Discontinued        2 g 200 mL/hr over 30 Minutes Intravenous Every 24 hours 08/03/20 1547 08/06/20 1325   08/01/20 1200  piperacillin-tazobactam (ZOSYN) IVPB 3.375 g  Status:  Discontinued        3.375 g 12.5 mL/hr over 240 Minutes Intravenous Every 8 hours 08/01/20 1134 08/03/20 1509   07/26/20 1336  ceFAZolin (ANCEF) 2-4 GM/100ML-% IVPB       Note to Pharmacy: [facility]   : cabinet override      07/26/20 1336 07/27/20 0144       Objective   Vitals:   08/17/20 2300 08/18/20 0300 08/18/20 0313 08/18/20 0826  BP: (!)  141/74  130/85 132/80  Pulse: (!) 56  74 81  Resp: 18 14 17 18   Temp: 98 F (36.7 C)  97.9 F (36.6 C) 98.2 F (36.8 C)  TempSrc: Oral  Oral Axillary  SpO2: 100%  100% 100%  Weight:      Height:        SpO2: 100 % O2 Flow Rate (L/min): 2 L/min FiO2 (%): 40 %  Wt Readings from Last 3 Encounters:  08/16/20 72.6 kg  06/29/20 81.6 kg  08/29/18 58.1 kg     Intake/Output Summary (Last 24 hours) at 08/18/2020 1111 Last data filed at 08/18/2020 0609 Gross per 24 hour  Intake 1037.09 ml  Output 2850 ml  Net -1812.91 ml    Physical Exam:     Lying in bed, resting comfortably Left hemiplegia Sporadically moves right hand, able to squeeze right hand Copious oral secretions  No tachypnea, normal respirator effort on 2L Stephens increased upper airway sounds Regular rate, no peripheral edema noted Abdomen soft   I have personally reviewed the following:   Data Reviewed:  CBC Recent Labs  Lab 08/12/20 0151 08/13/20 1352 08/13/20 1634 08/13/20 2200 08/14/20 1321 08/15/20 0302 08/16/20 0203 08/17/20 0844 08/18/20 0258  WBC 20.1*   < > 49.9*  --  35.5* 21.7* 12.8* 10.1 10.9*  HGB 7.7*   < > 6.8*   < > 7.7* 7.3* 7.3* 7.5* 7.9*  HCT 26.3*   < > 23.5*   < > 25.0* 24.2* 24.6* 25.7* 26.6*  PLT 471*   < > 399  --  298 238 306 247 268  MCV 72.9*   < > 73.9*  --  75.5* 75.6* 75.9* 76.5* 76.7*  MCH 21.3*   < > 21.4*  --  23.3* 22.8* 22.5* 22.3* 22.8*  MCHC 29.3*   < > 28.9*  --  30.8 30.2 29.7* 29.2* 29.7*  RDW 18.8*   < > 19.8*  --  19.9* 20.3* 21.2* 22.2* 22.0*  LYMPHSABS 1.7  --  2.0  --   --   --   --   --  1.3  MONOABS 1.7*  --  2.5*  --   --   --   --   --  1.4*  EOSABS 0.1  --  0.5  --   --   --   --   --  0.2  BASOSABS 0.1  --  0.0  --   --   --   --   --  0.1   < > = values in this interval not displayed.    Chemistries  Recent Labs  Lab 08/13/20 1352 08/14/20 1321 08/15/20 0302 08/16/20 0203 08/17/20 0220  NA 132* 131* 131* 133* 136  K 4.6 4.4 4.5 4.3 4.3   CL 101 101 102 104 107  CO2 21* 19* 21* 22 21*  GLUCOSE 120* 174* 146* 140* 89  BUN 35* 31* 31* 31* 31*  CREATININE 1.37* 1.31* 1.28* 1.30* 1.31*  CALCIUM 8.2* 8.2* 8.1* 8.3* 8.4*  AST  --   --  28  --   --   ALT  --   --  43  --   --   ALKPHOS  --   --  159*  --   --   BILITOT  --   --  0.3  --   --    ------------------------------------------------------------------------------------------------------------------ No results for input(s): CHOL, HDL, LDLCALC, TRIG, CHOLHDL, LDLDIRECT in the last 72 hours.  Lab Results  Component Value Date   HGBA1C 8.7 (H) 07/27/2020   ------------------------------------------------------------------------------------------------------------------ No results for input(s): TSH, T4TOTAL, T3FREE, THYROIDAB in the last 72 hours.  Invalid input(s): FREET3 ------------------------------------------------------------------------------------------------------------------ No results for input(s): VITAMINB12, FOLATE, FERRITIN, TIBC, IRON, RETICCTPCT in the last 72 hours.  Coagulation profile Recent Labs  Lab 08/13/20 1634  INR 1.2    No results for input(s): DDIMER in the last 72 hours.  Cardiac Enzymes No results for input(s): CKMB, TROPONINI, MYOGLOBIN in the last 168 hours.  Invalid input(s): CK ------------------------------------------------------------------------------------------------------------------ No results found for: BNP  Micro Results Recent Results (from the past 240 hour(s))  Urine Culture     Status: None   Collection Time: 08/13/20 10:43 AM   Specimen: Urine, Random  Result Value Ref Range Status   Specimen Description URINE, RANDOM  Final   Special Requests NONE  Final   Culture   Final    NO GROWTH Performed at [facility], [address], [place], [place] [address]    Report Status 08/14/2020 FINAL  Final  Culture, blood (routine x 2)     Status: None   Collection Time: 08/13/20  1:52 PM   Specimen:  BLOOD RIGHT HAND  Result Value Ref Range Status   Specimen Description BLOOD RIGHT HAND  Final   Special Requests   Final    BOTTLES DRAWN AEROBIC ONLY Blood Culture adequate volume   Culture   Final    NO GROWTH 5 DAYS Performed at [facility], [address] [place], [place] [address]    Report Status 08/18/2020 FINAL  Final  Culture, blood (routine x 2)     Status: None   Collection Time: 08/13/20  1:58 PM   Specimen: BLOOD RIGHT HAND  Result Value Ref Range Status   Specimen Description BLOOD RIGHT HAND  Final   Special Requests   Final    BOTTLES DRAWN AEROBIC ONLY Blood Culture adequate volume   Culture   Final    NO GROWTH 5 DAYS Performed at [facility], [address], [place], [place] [address]    Report Status 08/18/2020 FINAL  Final  C Difficile Quick Screen (NO PCR Reflex)     Status: Abnormal   Collection Time: 08/14/20  1:48 AM  Specimen: STOOL  Result Value Ref Range Status   C Diff antigen POSITIVE (A) NEGATIVE Final   C Diff toxin POSITIVE (A) NEGATIVE Final   C Diff interpretation Toxin producing C. difficile detected.  Final    Comment: CRITICAL RESULT CALLED TO, READ BACK BY AND VERIFIED WITH: Lutricia Horsfall RN 08/14/20 0501 JDW Performed at [facility], [address], [place], [place] [address]     Radiology Reports CT HEAD WO CONTRAST  Result Date: 08/01/2020 CLINICAL DATA:  Stroke follow-up. Postop endovascular revascularization of occluded right internal carotid artery distally. EXAM: CT HEAD WITHOUT CONTRAST TECHNIQUE: Contiguous axial images were obtained from the base of the skull through the vertex without intravenous contrast. COMPARISON:  CT head 07/27/2020.  MRI head 07/27/2020 FINDINGS: Brain: Involving acute right MCA infarct. There is progressive edema in the right insula and right frontal lobe. There is involvement of the basal ganglia with a mild amount of hemorrhage in the right basal ganglia. This is unchanged. This is  lateral to the area of calcification in the right basal ganglia. Acute infarct in the caudate is more apparent and lower density today. Right medial frontal lobe infarct also shows progressive edema. Small area of infarct in the right occipital lobe best seen on diffusion-weighted imaging. Generalized atrophy. Negative for hydrocephalus. 1 mm midline shift to the left. No new area of hemorrhage. Chronic infarct in the left pons. Vascular: Negative for hyperdense vessel Skull: Negative Sinuses/Orbits: Mucosal edema paranasal sinuses.  Negative orbit Other: None IMPRESSION: Evolving acute right MCA infarct. Low-density edema infarction is more apparent now in the insula and frontal lobe and basal ganglia. There is progressive low-density in the head of the caudate which did show infarct on prior diffusion imaging. Small amount of hemorrhage in the right basal ganglia is unchanged from the prior CT. Right frontal ACA infarct is unchanged in size but is lower density today. Small acute infarct right occipital lobe best seen on diffusion-weighted imaging. No new area of hemorrhage since the prior studies. Electronically Signed   By: Marlan Palau M.D.   On: 08/01/2020 15:43   CT HEAD WO CONTRAST  Addendum Date: 07/27/2020   ADDENDUM REPORT: 07/27/2020 13:27 ADDENDUM: Study discussed by telephone with Dr. Pearlean Brownie on 07/27/2020 at 1313 hours. He advised that follow-up MRI is pending. Electronically Signed   By: Odessa Fleming M.D.   On: 07/27/2020 13:27   Result Date: 07/27/2020 CLINICAL DATA:  75 year old female status post code stroke presentation with right ICA terminus occlusion. Status post IV tPA, endovascular revascularization. EXAM: CT HEAD WITHOUT CONTRAST TECHNIQUE: Contiguous axial images were obtained from the base of the skull through the vertex without intravenous contrast. COMPARISON:  Post treatment plain head CT, CTA head, CT Perfusion, presentation head CT yesterday. FINDINGS: Brain: Mass effect now on the  right lateral ventricle, especially the right frontal horn (series 6, image 13) with mixed hypo- and hyper density in the anterior right frontal lobe, right inferior frontal gyrus and right basal ganglia (series 6, image 13). No significant midline shift. No extra-axial hemorrhage or ventriculomegaly. Superimposed dystrophic basal ganglia calcifications are stable. Posterior to the anterior genu gray-white matter differentiation appears largely stable. There is possible early cytotoxic edema at the posterior operculum. Stable left hemisphere and posterior fossa gray-white matter differentiation. No other intracranial blood products. Vascular: Calcified atherosclerosis at the skull base. No suspicious intracranial vascular hyperdensity. Skull: No acute osseous abnormality identified. Sinuses/Orbits: Intubated, small volume of fluid layering in the pharynx.  Mild paranasal sinus fluid or mucosal thickening. Tympanic cavities and mastoids remain clear. Other: Disconjugate gaze now.  Scalp soft tissues remain negative. IMPRESSION: 1. New mass effect in the anterior right frontal lobe with regional increasing mixed hypo- and hyperdensity. Favor a combination of anterior right MCA territory cytotoxic edema and petechial hemorrhage. 2. No malignant hemorrhagic transformation at this time. No midline shift or loss of basilar cisterns. 3. Serial noncontrast Head CT for surveillance of mass effect recommended. Electronically Signed: By: Odessa Fleming M.D. On: 07/27/2020 12:34   CT HEAD WO CONTRAST  Result Date: 07/26/2020 CLINICAL DATA:  Stroke, follow-up post revascularization. EXAM: CT HEAD WITHOUT CONTRAST TECHNIQUE: Contiguous axial images were obtained from the base of the skull through the vertex without intravenous contrast. COMPARISON:  CT angiogram head/neck and CT perfusion 07/26/2020, noncontrast head CT 07/26/2020 FINDINGS: Brain: Redemonstrated mineralization within the bilateral basal ganglia. There is subtle loss  of gray-white differentiation within the right insula consistent with acute ischemic infarction. Additionally, there is subtle asymmetric hyperdensity of the right caudate and lentiform nuclei which may reflect contrast staining/enhancement at site of acute infarction. No convincing evidence of acute intracranial hemorrhage. Stable background generalized parenchymal atrophy and chronic small vessel ischemic disease. No extra-axial fluid collection. No evidence of intracranial mass. No midline shift. Vascular: Residual circulating contrast material limits evaluation for hyperdense vessels Skull: Normal. Negative for fracture or focal lesion. Sinuses/Orbits: Visualized orbits show no acute finding. Mild ethmoid and maxillary sinus mucosal thickening at the imaged levels. No significant mastoid effusion IMPRESSION: Subtle changes of acute ischemic infarction within the right insula. Additionally, there is subtle asymmetric hyperdensity of the right caudate and lentiform nuclei which may reflect contrast staining or enhancement at site of acute infarction. No convincing evidence of acute intracranial hemorrhage. Stable background generalized parenchymal atrophy and chronic small vessel ischemic disease. Electronically Signed   By: Jackey Loge DO   On: 07/26/2020 15:47   CT CHEST W CONTRAST  Result Date: 08/14/2020 CLINICAL DATA:  Abdominal pain, fever, sepsis EXAM: CT CHEST, ABDOMEN, AND PELVIS WITH CONTRAST TECHNIQUE: Multidetector CT imaging of the chest, abdomen and pelvis was performed following the standard protocol during bolus administration of intravenous contrast. CONTRAST:  OMNIPAQUE IOHEXOL 300 MG/ML  SOLN COMPARISON:  07/29/2020, 06/29/2020 FINDINGS: CT CHEST FINDINGS Cardiovascular: Heart is mildly enlarged with prominent biatrial dilatation. No pericardial effusion. There is calcification of the mitral annulus. The aorta is normal in caliber with no evidence of aneurysm or dissection.  Mediastinum/Nodes: An enteric catheter extends into the gastric lumen. Stable appearance of the thyroid. Trachea is unremarkable. No pathologic adenopathy. Lungs/Pleura: Respiratory motion limits evaluation of the lungs. There is upper lobe predominant interlobular septal thickening, with trace bilateral pleural effusions, suggesting mild edema. No focal consolidation or pneumothorax. Musculoskeletal: No acute or destructive bony lesions. Reconstructed images demonstrate no additional findings. CT ABDOMEN PELVIS FINDINGS Hepatobiliary: No focal liver abnormality is seen. Status post cholecystectomy. No biliary dilatation. Pancreas: Unremarkable. No pancreatic ductal dilatation or surrounding inflammatory changes. Spleen: Normal in size without focal abnormality. Adrenals/Urinary Tract: Evaluation limited due to respiratory motion. There is heterogeneous decreased enhancement of the left kidney, with mild renal edema and perinephric fat stranding. Findings are concerning for acute pyelonephritis. No evidence of renal abscess. Large right renal cyst again noted. The remainder of the right kidney enhances normally. The adrenals are unremarkable. The bladder is moderately distended without focal abnormality. Stomach/Bowel: Enteric catheter within the bladder lumen. No bowel obstruction or ileus. No wall  thickening or inflammatory change. Rectal catheter in place. Vascular/Lymphatic: No significant vascular findings are present. No enlarged abdominal or pelvic lymph nodes. Reproductive: Uterus and bilateral adnexa are unremarkable. Other: No free fluid or free gas. Mild subcutaneous edema within the bilateral flanks. No abdominal wall hernia. Musculoskeletal: No acute or destructive bony lesions. Reconstructed images demonstrate no additional findings. IMPRESSION: 1. Heterogeneous decreased enhancement of the left kidney, with mild renal edema and perinephric fat stranding. Findings are concerning for acute  pyelonephritis. No evidence of renal abscess. 2. Mild cardiomegaly with prominent biatrial dilatation. 3. Trace bilateral pleural effusions and mild interstitial edema. Electronically Signed   By: Sharlet Salina M.D.   On: 08/14/2020 15:59   MR BRAIN WO CONTRAST  Result Date: 07/27/2020 CLINICAL DATA:  Stroke follow-up EXAM: MRI HEAD WITHOUT CONTRAST TECHNIQUE: Multiplanar, multiecho pulse sequences of the brain and surrounding structures were obtained without intravenous contrast. COMPARISON:  Brain MRI 07/10/2018 FINDINGS: Brain: Multifocal ischemic infarction within the right MCA territory, greatest at the frontal operculum and insula. There is also cortical ischemia within the right ACA territory and right PCA territory. There is a punctate focus of acute ischemia in the left occipital lobe. Large area of infarction in the right basal ganglia. There is petechial hemorrhage versus is contrast staining in the right basal ganglia as well. Moderate edema associated with the ischemic areas. There is mass effect on the frontal horn of the right lateral ventricle. No herniation. There is an old left pontine infarct. No chronic microhemorrhage. Normal midline structures. Vascular: Normal flow voids. Skull and upper cervical spine: Normal marrow signal. Sinuses/Orbits: Right maxillary sinus mucosal thickening. Normal orbits. Other: None. IMPRESSION: 1. Multifocal ischemic infarction within the right MCA territory, greatest at the frontal operculum and insula. 2. Additional cortical ischemia within the right ACA territory and right PCA territory. 3. Large area of infarction in the right basal ganglia with petechial hemorrhage versus contrast staining. 4. Punctate focus of acute ischemia in the left occipital lobe. Electronically Signed   By: Deatra Robinson M.D.   On: 07/27/2020 23:50   CT ABDOMEN PELVIS W CONTRAST  Result Date: 08/14/2020 CLINICAL DATA:  Abdominal pain, fever, sepsis EXAM: CT CHEST, ABDOMEN, AND  PELVIS WITH CONTRAST TECHNIQUE: Multidetector CT imaging of the chest, abdomen and pelvis was performed following the standard protocol during bolus administration of intravenous contrast. CONTRAST:  OMNIPAQUE IOHEXOL 300 MG/ML  SOLN COMPARISON:  07/29/2020, 06/29/2020 FINDINGS: CT CHEST FINDINGS Cardiovascular: Heart is mildly enlarged with prominent biatrial dilatation. No pericardial effusion. There is calcification of the mitral annulus. The aorta is normal in caliber with no evidence of aneurysm or dissection. Mediastinum/Nodes: An enteric catheter extends into the gastric lumen. Stable appearance of the thyroid. Trachea is unremarkable. No pathologic adenopathy. Lungs/Pleura: Respiratory motion limits evaluation of the lungs. There is upper lobe predominant interlobular septal thickening, with trace bilateral pleural effusions, suggesting mild edema. No focal consolidation or pneumothorax. Musculoskeletal: No acute or destructive bony lesions. Reconstructed images demonstrate no additional findings. CT ABDOMEN PELVIS FINDINGS Hepatobiliary: No focal liver abnormality is seen. Status post cholecystectomy. No biliary dilatation. Pancreas: Unremarkable. No pancreatic ductal dilatation or surrounding inflammatory changes. Spleen: Normal in size without focal abnormality. Adrenals/Urinary Tract: Evaluation limited due to respiratory motion. There is heterogeneous decreased enhancement of the left kidney, with mild renal edema and perinephric fat stranding. Findings are concerning for acute pyelonephritis. No evidence of renal abscess. Large right renal cyst again noted. The remainder of the right kidney enhances normally.  The adrenals are unremarkable. The bladder is moderately distended without focal abnormality. Stomach/Bowel: Enteric catheter within the bladder lumen. No bowel obstruction or ileus. No wall thickening or inflammatory change. Rectal catheter in place. Vascular/Lymphatic: No significant  vascular findings are present. No enlarged abdominal or pelvic lymph nodes. Reproductive: Uterus and bilateral adnexa are unremarkable. Other: No free fluid or free gas. Mild subcutaneous edema within the bilateral flanks. No abdominal wall hernia. Musculoskeletal: No acute or destructive bony lesions. Reconstructed images demonstrate no additional findings. IMPRESSION: 1. Heterogeneous decreased enhancement of the left kidney, with mild renal edema and perinephric fat stranding. Findings are concerning for acute pyelonephritis. No evidence of renal abscess. 2. Mild cardiomegaly with prominent biatrial dilatation. 3. Trace bilateral pleural effusions and mild interstitial edema. Electronically Signed   By: Sharlet Salina M.D.   On: 08/14/2020 15:59   CT CEREBRAL PERFUSION W CONTRAST  Result Date: 07/26/2020 CLINICAL DATA:  Acute presentation with left body weakness and speech disturbance. EXAM: CT ANGIOGRAPHY HEAD AND NECK CT PERFUSION BRAIN TECHNIQUE: Multidetector CT imaging of the head and neck was performed using the standard protocol during bolus administration of intravenous contrast. Multiplanar CT image reconstructions and MIPs were obtained to evaluate the vascular anatomy. Carotid stenosis measurements (when applicable) are obtained utilizing NASCET criteria, using the distal internal carotid diameter as the denominator. Multiphase CT imaging of the brain was performed following IV bolus contrast injection. Subsequent parametric perfusion maps were calculated using RAPID software. CONTRAST:  Not annotated COMPARISON:  Head CT earlier same day FINDINGS: CTA NECK FINDINGS Aortic arch: No aortic atherosclerotic calcification seen. No aneurysm or dissection. Branching pattern is normal with wide patency of the origins. Right carotid system: Common carotid artery widely patent to the bifurcation. No atherosclerotic disease at the carotid bifurcation. ICA widely patent. Left carotid system: Common carotid  artery widely patent to the bifurcation. Carotid bifurcation is normal without soft or calcified plaque. ICA widely patent. Vertebral arteries: Both vertebral artery origins are widely patent. Both vertebral arteries are normal through the cervical region to the foramen magnum. Skeleton: Ordinary cervical spondylosis. Other neck: No mass or lymphadenopathy. Upper chest: No active chest disease. 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 Review of the MIP images confirms the above findings CTA HEAD FINDINGS Anterior circulation: Complete occlusion of the right carotid terminus. Minimal flow visible in the right MCA territory. Right ACA receives it supply through the anterior communicating route. No left stenosis or occlusion. Right PCA arises from the carotid artery immediately proximal to the occlusion. Posterior circulation: Both vertebral arteries patent to the basilar. Atherosclerotic narrowing the distal vertebral arteries and of the basilar. Posterior circulation branch vessels are patent. Right PCA receives most of it supply from the anterior circulation. Venous sinuses: Patent and normal. Anatomic variants: None significant. 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 Minimal flow seen in the right MCA territory. Right ACA territory appears normally perfused, probably due to a patent anterior communicating artery. 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 Large penumbra, 136 cc in the remainder of the right MCA territory. No aortic atherosclerotic disease seen. No atherosclerotic disease at either carotid bifurcation. Some atherosclerotic narrowing of the distal vertebral arteries and basilar artery.  Preliminary report sent by  text at 1238 hours. Discussed with stroke PA 1242 hours. Electronically Signed   By: Paulina Fusi M.D.   On: 07/26/2020 12:48   DG CHEST PORT 1 VIEW  Result Date: 08/16/2020 CLINICAL DATA:  73 year old female with history of stroke. EXAM: PORTABLE CHEST 1 VIEW COMPARISON:  Chest x-ray 08/13/2020. FINDINGS: Feeding tube extending into the stomach with tip coiled back in the proximal stomach. Lung volumes are normal. No consolidative airspace disease. No pleural effusions. No evidence of pulmonary edema. No pneumothorax. No suspicious appearing pulmonary nodules or masses are noted. Mild cardiomegaly. Upper mediastinal contours are within normal limits allowing for patient positioning. Surgical clips project over the right upper quadrant of the abdomen, likely from prior cholecystectomy. IMPRESSION: 1. Tip of feeding tube is in the proximal stomach. 2. No radiographic evidence of acute cardiopulmonary disease. 3. Mild cardiomegaly. Electronically Signed   By: Trudie Reed M.D.   On: 08/16/2020 10:09   DG CHEST PORT 1 VIEW  Result Date: 08/13/2020 CLINICAL DATA:  Febrile with leukocytosis. Dysphagia due to recent cerebrovascular accident. EXAM: PORTABLE CHEST 1 VIEW COMPARISON:  08/05/2020 FINDINGS: Feeding tube tip projects at the gastric cardia, similar to prior. Cardiomediastinal silhouette is similar to prior. Similar mild elevation the right hemidiaphragm. No consolidation. Linear bibasilar opacities, favored to represent atelectasis. No sizable pleural effusion or discernible pneumothorax. Biapical pleuroparenchymal scarring. The visualized skeletal structures are unremarkable. Apparent widening of the left glenohumeral joint. IMPRESSION: 1. No evidence of consolidation, although portable technique limits evaluation of the lung bases. Dedicated PA and lateral radiographs could better evaluate if the patient is able. 2. Apparent widening of the left glenohumeral joint.  Consider dedicated left shoulder radiographs to further evaluate. Electronically Signed   By: Feliberto Harts MD   On: 08/13/2020 11:54   DG CHEST PORT 1 VIEW  Result Date: 08/05/2020 CLINICAL DATA:  Fever EXAM: PORTABLE CHEST 1 VIEW COMPARISON:  08/03/2020 FINDINGS: Endotracheal tube terminates 3 cm above the carina. Weighted feeding tube terminates in the gastric cardia. Lungs are clear. No pleural effusion or pneumothorax. Mild eventration of the right hemidiaphragm. Cardiomegaly. IMPRESSION: Endotracheal tube terminates 3 cm above the carina. Weighted feeding tube terminates in the gastric cardia. Electronically Signed   By: Charline Bills M.D.   On: 08/05/2020 09:53   DG CHEST PORT 1 VIEW  Result Date: 08/03/2020 CLINICAL DATA:  Fevers. EXAM: PORTABLE CHEST 1 VIEW COMPARISON:  08/01/2020 FINDINGS: ETT tip is 3.4 cm above the carina. Feeding tube is noted with tip looped in the stomach. Mild cardiac enlargement. There is no pleural effusion or edema. No airspace opacities. IMPRESSION: 1. Stable position of support apparatus. 2. Mild cardiac enlargement. Electronically Signed   By: Signa Kell M.D.   On: 08/03/2020 10:29   DG CHEST PORT 1 VIEW  Result Date: 08/01/2020 CLINICAL DATA:  Cardiomegaly, CHF EXAM: PORTABLE CHEST 1 VIEW COMPARISON:  07/27/2020 chest radiograph. FINDINGS: Endotracheal tube tip is 3.3 cm above the carina. Weighted enteric tube loops in the proximal stomach with the tip overlying the gastric cardia/fundus. Stable cardiomediastinal silhouette with mild to moderate cardiomegaly. No pneumothorax. No pleural effusion. No overt pulmonary edema. No acute consolidative airspace disease. IMPRESSION: Well-positioned support structures. Stable mild-to-moderate cardiomegaly without overt pulmonary edema. Electronically Signed   By: Delbert Phenix M.D.   On: 08/01/2020 08:57   DG Chest Port 1 View  Result Date: 07/27/2020 CLINICAL DATA:  History of stroke and hypertension EXAM:  PORTABLE CHEST 1 VIEW COMPARISON:  07/26/2020 FINDINGS: Endotracheal  and enteric tubes are unchanged in position. Shallow inspiration. Cardiac enlargement. No vascular congestion or edema. Lungs are clear with improvement since prior study. No effusions. Surgical clips in the right upper quadrant. IMPRESSION: Cardiac enlargement. No evidence of active pulmonary disease. Improvement since prior study. Electronically Signed   By: Burman Nieves M.D.   On: 07/27/2020 06:35   DG CHEST PORT 1 VIEW  Result Date: 07/26/2020 CLINICAL DATA:  Inhibition EXAM: PORTABLE CHEST 1 VIEW COMPARISON:  Chest x-ray dated 06/29/2020. FINDINGS: Endotracheal tube appears well positioned with tip approximately 3 cm above the carina. Enteric tube passes below the diaphragm. Cardiomegaly. Central pulmonary vascular congestion. No pleural effusion or pneumothorax is seen. IMPRESSION: 1. Endotracheal tube appears well positioned with tip approximately 3 cm above the carina. 2. Cardiomegaly with central pulmonary vascular congestion indicating CHF/volume overload. Electronically Signed   By: Bary Richard M.D.   On: 07/26/2020 16:58   DG Abd Portable 1V  Result Date: 07/29/2020 CLINICAL DATA:  Feeding into placement EXAM: PORTABLE ABDOMEN - 1 VIEW COMPARISON:  July 26, 2020 FINDINGS: Nasogastric tube removed. Feeding tube present with tip in the stomach. Visualized bowel unremarkable. No free air evident. Visualized lungs clear. Endotracheal tube tip is 4.0 cm above the carina. IMPRESSION: Feeding tube tip in stomach. Visualized bowel gas pattern unremarkable. Visualized lungs clear. Electronically Signed   By: Bretta Bang III M.D.   On: 07/29/2020 12:02   DG Abd Portable 1V  Result Date: 07/26/2020 CLINICAL DATA:  OG tube placement EXAM: PORTABLE ABDOMEN - 1 VIEW COMPARISON:  None. FINDINGS: Enteric tube appears well positioned in the stomach. Visualized bowel gas pattern is nonobstructive. Cholecystectomy clips in the RIGHT  upper quadrant. IMPRESSION: Enteric tube appears well positioned in the stomach. Electronically Signed   By: Bary Richard M.D.   On: 07/26/2020 16:57   IR PERCUTANEOUS ART THROMBECTOMY/INFUSION INTRACRANIAL INC DIAG ANGIO  Result Date: 07/28/2020 INDICATION: Acute onset of left-sided hemiplegia, right gaze deviation and slurred speech. Occluded right internal carotid artery supraclinoid segment, right middle cerebral artery and the right anterior cerebral artery. EXAM: 1. EMERGENT LARGE VESSEL OCCLUSION THROMBOLYSIS (anterior CIRCULATION) COMPARISON:  CT angiogram of the head and neck of July 26, 2020. MEDICATIONS: Ancef 2 g IV antibiotic was administered within 1 hour of the procedure. ANESTHESIA/SEDATION: General anesthesia CONTRAST:  Isovue 300 approximately 100 mL FLUOROSCOPY TIME:  Fluoroscopy Time: 38 minutes 36 seconds (1410 mGy). COMPLICATIONS: None immediate. TECHNIQUE: Following a full explanation of the procedure along with the potential associated complications, an informed witnessed consent was obtained the patient's spouse via an interpreter. The risks of intracranial hemorrhage of 10%, worsening neurological deficit, ventilator dependency, death and inability to revascularize were all reviewed in detail with the patient's spouse. The patient was then put under general anesthesia by the Department of Anesthesiology at [facility]. The right groin was prepped and draped in the usual sterile fashion. Thereafter using modified Seldinger technique, transfemoral access into the right common femoral artery was obtained without difficulty. Over a 0.035 inch guidewire a 5 French Pinnacle sheath was inserted. Through this, and also over a 0.035 inch guidewire a 8 French 25 cm catheter was advanced to the aortic arch region and selectively positioned in the innominate artery and right common carotid artery. FINDINGS: Innominate arteriogram demonstrates the origin of the right subclavian artery and  the right common carotid artery to be widely patent. Moderate tortuosity is seen of the right common carotid artery proximally. The right vertebral artery origin  is widely patent. The vessel is seen to opacify to the cranial skull base. The opacified portion of the basilar artery on the AP projection demonstrates patency with suggestion of a 50% stenosis of the right vertebrobasilar junction. The right common carotid arteriogram demonstrates the right external carotid artery and its major branches to be widely patent. The right internal carotid artery at the bulb to the cranial skull base is patent with moderate severe tortuosity involving the mid right M1 segment associated with a 50% stenosis. The petrous and proximal cavernous segments are widely patent. There is complete angiographic occlusion of the right internal carotid artery just distal to the origin of the right posterior communicating artery supplying the right posterior cerebral artery distribution. PROCEDURE: The diagnostic JB 1 catheter in the right common carotid artery was exchanged over a 0.035 inch 300 cm Rosen exchange guidewire for an 087 95 cm balloon guide catheter which had been prepped with 50% contrast and 50% heparinized saline infusion. Guidewire was removed. Good aspiration obtained from the hub of the balloon guide catheter. A gentle contrast injection demonstrated no evidence of vasospasm, or of intraluminal filling defects. Over a 0.014 inch standard Synchro micro guidewire, an 027 160 cm Marksman microcatheter inside of an 071 135 cm Zoom aspiration catheter was advanced without difficulty to the distal cavernous segment of the right internal carotid artery. The micro guidewire was then gently manipulated without difficulty and advanced into the inferior division of the right middle cerebral artery M2 M3 region followed by the microcatheter. The guidewire was removed. Mild resistance was again to aspiration which cleared with more  proximal position of the microcatheter. A gentle control arteriogram performed through the microcatheter demonstrated safe position of tip of the microcatheter, which was connected to continuous heparinized saline infusion. A 3 mm x 20 mm Solitaire X retrieval device was then advanced to the distal end of the microcatheter. The O ring on the delivery microcatheter was loosened. With slight forward gentle traction with the right hand on the delivery micro guidewire with the left hand the delivery microcatheter was retrieved unsheathing the retrieval device. The 071 aspiration catheter was engaged in the occluded right middle cerebral artery M1 segment. Continued aspiration was applied using an aspiration device with proximal flow arrest in the right internal carotid artery, and aspiration using a 60 mL syringe at the hub of the balloon guide catheter for approximately 2-1/2 minutes. Thereafter, the combination of the retrieval device, the microcatheter, aspiration catheter was retrieved and removed as aspiration was applied at the hub of the balloon guide catheter. Large clot and small pieces were seen from within the aspiration catheter. A control arteriogram performed through the balloon guide following reversal of flow arrest in the right internal carotid artery demonstrated wide patency of the superior and inferior divisions of the right middle cerebral artery. A control arteriogram performed following infusion of 25 mcg of nitroglycerin intra-arterially now demonstrated significantly improved caliber and flow through the superior and inferior divisions. There continued be slow flow involving the M3 M4 branches of the anterior of peri insular branches consistent with the site of the core seen on the CT perfusion studies. A TICI 2C revascularization of the right MCA distribution had been achieved. The right anterior cerebral artery was now also widely patent with a TICI 3 revascularization. Patency of the right  posterior communicating artery and the right posterior cerebral artery territory was intact. Control arteriogram performed through the balloon guide in the right common carotid artery demonstrated  free flow in the right internal carotid artery extra cranially and intracranially. This was then removed. The 8 French Pinnacle sheath in the right groin was replaced with an 8 French Angio-Seal closure device for hemostasis. The distal pulses remained Dopplerable in both the DPs and posterior tibial arteries bilaterally unchanged. Post procedure CT scan of the brain demonstrated contrast blush in the right basal ganglia region. No gross mass effect or midline shift was noted. The patient was left intubated on account of the patient's neurologic condition prior to intubation. She was then transported to the neuro ICU for post thrombectomy care. IMPRESSION: Status post endovascular revascularization of occluded right internal carotid artery supraclinoid segment, the right middle cerebral and the right anterior cerebral artery with 1 pass with a 3 mm x 20 mm Solitaire X retrieval device, contact aspiration and proximal flow arrest achieving a TICI 2C revascularization. PLAN: Follow-up as per referring MD. Electronically Signed   By: Julieanne Cotton M.D.   On: 07/27/2020 14:02   CT HEAD CODE STROKE WO CONTRAST  Result Date: 07/26/2020 CLINICAL DATA:  Code stroke. Left-sided weakness. Right gaze disturbance. EXAM: CT HEAD WITHOUT CONTRAST TECHNIQUE: Contiguous axial images were obtained from the base of the skull through the vertex without intravenous contrast. COMPARISON:  07/09/2018 FINDINGS: Brain: Age related volume loss. Chronic small-vessel ischemic changes of the brainstem, cerebellum and cerebral hemispheres. Physiologic basal ganglia calcification. No visible acute infarction, mass lesion, hemorrhage, hydrocephalus or extra-axial collection. Vascular: There is atherosclerotic calcification of the major vessels  at the base of the brain. Skull: Negative Sinuses/Orbits: Clear/normal Other: None ASPECTS ([place] Stroke Program Early CT Score) - Ganglionic level infarction (caudate, lentiform nuclei, internal capsule, insula, M1-M3 cortex): 7 - Supraganglionic infarction (M4-M6 cortex): 3 Total score (0-10 with 10 being normal): 10 IMPRESSION: 1. No acute CT finding. Chronic small-vessel ischemic changes throughout the brain. Physiologic calcification of basal ganglia. 2. ASPECTS is 10 3. Discussed with Dr. Otelia Limes during interpretation at 12 17 hours. Electronically Signed   By: Paulina Fusi M.D.   On: 07/26/2020 12:22   VAS [place] UPPER EXTREMITY VENOUS DUPLEX  Result Date: 08/07/2020 UPPER VENOUS STUDY  Indications: Edema Risk Factors: None identified. Limitations: Body habitus, poor ultrasound/tissue interface and patient positioning, patient movement. Comparison Study: No prior studies. Performing Technologist: Chanda Busing RVT  Examination Guidelines: A complete evaluation includes B-mode imaging, spectral Doppler, color Doppler, and power Doppler as needed of all accessible portions of each vessel. Bilateral testing is considered an integral part of a complete examination. Limited examinations for reoccurring indications may be performed as noted.  Right Findings: +----------+------------+---------+-----------+----------+-------+ RIGHT     CompressiblePhasicitySpontaneousPropertiesSummary +----------+------------+---------+-----------+----------+-------+ Subclavian    Full       Yes       Yes                      +----------+------------+---------+-----------+----------+-------+  Left Findings: +----------+------------+---------+-----------+----------+-------+ LEFT      CompressiblePhasicitySpontaneousPropertiesSummary +----------+------------+---------+-----------+----------+-------+ IJV           Full       Yes       Yes                       +----------+------------+---------+-----------+----------+-------+ Subclavian    Full       Yes       Yes                      +----------+------------+---------+-----------+----------+-------+ Axillary  Full       Yes       Yes                      +----------+------------+---------+-----------+----------+-------+ Brachial      Full       Yes       Yes                      +----------+------------+---------+-----------+----------+-------+ Radial        Full                                          +----------+------------+---------+-----------+----------+-------+ Ulnar         Full                                          +----------+------------+---------+-----------+----------+-------+ Cephalic      None                                   Acute  +----------+------------+---------+-----------+----------+-------+ Basilic       Full                                          +----------+------------+---------+-----------+----------+-------+  Summary:  Right: No evidence of thrombosis in the subclavian.  Left: No evidence of deep vein thrombosis in the upper extremity. Findings consistent with acute superficial vein thrombosis involving the left cephalic vein.  *See table(s) above for measurements and observations.  Diagnosing physician: Lemar Livings MD Electronically signed by Lemar Livings MD on 08/07/2020 at 8:17:46 AM.    Final    CT ANGIO HEAD CODE STROKE  Result Date: 07/26/2020 CLINICAL DATA:  Acute presentation with left body weakness and speech disturbance. EXAM: CT ANGIOGRAPHY HEAD AND NECK CT PERFUSION BRAIN TECHNIQUE: Multidetector CT imaging of the head and neck was performed using the standard protocol during bolus administration of intravenous contrast. Multiplanar CT image reconstructions and MIPs were obtained to evaluate the vascular anatomy. Carotid stenosis measurements (when applicable) are obtained utilizing NASCET criteria, using the distal  internal carotid diameter as the denominator. Multiphase CT imaging of the brain was performed following IV bolus contrast injection. Subsequent parametric perfusion maps were calculated using RAPID software. CONTRAST:  Not annotated COMPARISON:  Head CT earlier same day FINDINGS: CTA NECK FINDINGS Aortic arch: No aortic atherosclerotic calcification seen. No aneurysm or dissection. Branching pattern is normal with wide patency of the origins. Right carotid system: Common carotid artery widely patent to the bifurcation. No atherosclerotic disease at the carotid bifurcation. ICA widely patent. Left carotid system: Common carotid artery widely patent to the bifurcation. Carotid bifurcation is normal without soft or calcified plaque. ICA widely patent. Vertebral arteries: Both vertebral artery origins are widely patent. Both vertebral arteries are normal through the cervical region to the foramen magnum. Skeleton: Ordinary cervical spondylosis. Other neck: No mass or lymphadenopathy. Upper chest: No active chest disease. 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 Review of the MIP images confirms the above findings CTA HEAD  FINDINGS Anterior circulation: Complete occlusion of the right carotid terminus. Minimal flow visible in the right MCA territory. Right ACA receives it supply through the anterior communicating route. No left stenosis or occlusion. Right PCA arises from the carotid artery immediately proximal to the occlusion. Posterior circulation: Both vertebral arteries patent to the basilar. Atherosclerotic narrowing the distal vertebral arteries and of the basilar. Posterior circulation branch vessels are patent. Right PCA receives most of it supply from the anterior circulation. Venous sinuses: Patent and normal. Anatomic variants: None significant. 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 Minimal flow seen in the right MCA territory. Right ACA territory appears normally perfused, probably due to a patent anterior communicating artery. 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 Large penumbra, 136 cc in the remainder of the right MCA territory. No aortic atherosclerotic disease seen. No atherosclerotic disease at either carotid bifurcation. Some atherosclerotic narrowing of the distal vertebral arteries and basilar artery. Preliminary report sent by text at 1238 hours. Discussed with stroke PA 1242 hours. Electronically Signed   By: Paulina Fusi M.D.   On: 07/26/2020 12:48   CT ANGIO NECK CODE STROKE  Result Date: 07/26/2020 CLINICAL DATA:  Acute presentation with left body weakness and speech disturbance. EXAM: CT ANGIOGRAPHY HEAD AND NECK CT PERFUSION BRAIN TECHNIQUE: Multidetector CT imaging of the head and neck was performed using the standard protocol during bolus administration of intravenous contrast. Multiplanar CT image reconstructions and MIPs were obtained to evaluate the vascular anatomy. Carotid stenosis measurements (when applicable) are obtained utilizing NASCET criteria, using the distal internal carotid diameter as the denominator. Multiphase CT imaging of the brain was performed following IV bolus contrast injection. Subsequent parametric perfusion maps were calculated using RAPID software. CONTRAST:  Not annotated COMPARISON:  Head CT earlier same day FINDINGS: CTA NECK FINDINGS Aortic arch: No aortic atherosclerotic calcification seen. No aneurysm or dissection. Branching pattern is normal with wide patency of the origins. Right carotid system: Common carotid artery widely patent to the bifurcation. No atherosclerotic disease at the carotid bifurcation. ICA widely patent. Left carotid  system: Common carotid artery widely patent to the bifurcation. Carotid bifurcation is normal without soft or calcified plaque. ICA widely patent. Vertebral arteries: Both vertebral artery origins are widely patent. Both vertebral arteries are normal through the cervical region to the foramen magnum. Skeleton: Ordinary cervical spondylosis. Other neck: No mass or lymphadenopathy. Upper chest: No active chest disease. 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 Review of the MIP images confirms the above findings CTA HEAD FINDINGS Anterior circulation: Complete occlusion of the right carotid terminus. Minimal flow visible in the right MCA territory. Right ACA receives it supply through the anterior communicating route. No left stenosis or occlusion. Right PCA arises from the carotid artery immediately proximal to the occlusion. Posterior circulation: Both vertebral arteries patent to the basilar. Atherosclerotic narrowing the distal vertebral arteries and of the basilar. Posterior circulation branch vessels are patent. Right PCA receives most of it supply from the anterior circulation. Venous sinuses: Patent and normal. Anatomic variants: None significant. 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 Minimal flow seen in the right MCA territory. Right ACA territory appears normally perfused, probably due to a patent anterior communicating artery. 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 Large penumbra, 136 cc in the remainder of the right MCA territory. No aortic atherosclerotic disease seen. No atherosclerotic disease at either carotid bifurcation. Some atherosclerotic narrowing of the distal vertebral arteries  and basilar artery. Preliminary report sent by text at 1238 hours. Discussed with stroke PA 1242 hours. Electronically Signed   By: Paulina Fusi M.D.   On: 07/26/2020 12:48   IR ANGIO VERTEBRAL SEL SUBCLAVIAN INNOMINATE UNI R MOD SED  Result Date: 07/29/2020 INDICATION: Acute onset of left-sided hemiplegia, right gaze deviation and slurred speech.  Occluded right internal carotid artery supraclinoid segment, right middle cerebral artery and the right anterior cerebral artery.  EXAM: 1. EMERGENT LARGE VESSEL OCCLUSION THROMBOLYSIS (anterior CIRCULATION)  COMPARISON:  CT angiogram of the head and neck of July 26, 2020.  MEDICATIONS: Ancef 2 g IV antibiotic was administered within 1 hour of the procedure.  ANESTHESIA/SEDATION: General anesthesia  CONTRAST:  Isovue 300 approximately 100 mL  FLUOROSCOPY TIME:  Fluoroscopy Time: 38 minutes 36 seconds (1410 mGy).  COMPLICATIONS: None immediate.  TECHNIQUE: Following a full explanation of the procedure along with the potential associated complications, an informed witnessed consent was obtained the patient's spouse via an interpreter. The risks of intracranial hemorrhage of 10%, worsening neurological deficit, ventilator dependency, death and inability to revascularize were all reviewed in detail with the patient's spouse.  The patient was then put under general anesthesia by the Department of Anesthesiology at [facility]  The right groin was prepped and draped in the usual sterile fashion. Thereafter using modified Seldinger technique, transfemoral access into the right common femoral artery was obtained without difficulty. Over a 0.035 inch guidewire a 5 French Pinnacle sheath was inserted. Through this, and also over a 0.035 inch guidewire a 8 French 25 cm catheter was advanced to the aortic arch region and selectively positioned in the innominate artery and right common carotid artery.  FINDINGS: Innominate arteriogram demonstrates the origin  of the right subclavian artery and the right common carotid artery to be widely patent.  Moderate tortuosity is seen of the right common carotid artery proximally.  The right vertebral artery origin is widely patent.  The vessel is seen to opacify to the cranial skull base. The opacified portion of the basilar artery on the AP projection demonstrates patency with suggestion of a 50% stenosis of the right vertebrobasilar junction.  The right common carotid arteriogram demonstrates the right external carotid artery and its major branches to be widely patent.  The right internal carotid artery at the bulb to the cranial skull base is patent with moderate severe tortuosity involving the mid right M1 segment associated with a 50% stenosis.  The petrous and proximal cavernous segments are widely patent.  There is complete angiographic occlusion of the right internal carotid artery just distal to the origin of the right posterior communicating artery supplying the right posterior cerebral artery distribution.  PROCEDURE: The diagnostic JB 1 catheter in the right common carotid artery was exchanged over a 0.035 inch 300 cm Rosen exchange guidewire for an 087 95 cm balloon guide catheter which had been prepped with 50% contrast and 50% heparinized saline infusion.  Guidewire was removed. Good aspiration obtained from the hub of the balloon guide catheter. A gentle contrast injection demonstrated no evidence of vasospasm, or  of intraluminal filling defects.  Over a 0.014 inch standard Synchro micro guidewire, an 027 160 cm Marksman microcatheter inside of an 071 135 cm Zoom aspiration catheter was advanced without difficulty to the distal cavernous segment of the right internal carotid artery.  The micro guidewire was then gently manipulated without difficulty and advanced into the inferior division of the right middle cerebral artery M2 M3 region followed by the microcatheter. The guidewire was removed. Mild  resistance was again to aspiration which cleared with more proximal position of the microcatheter. A gentle control arteriogram performed through the microcatheter demonstrated safe position of tip of the microcatheter, which was connected to continuous heparinized saline infusion.  A 3 mm x 20 mm Solitaire X retrieval device was then advanced to the distal end of the microcatheter. The O ring on the delivery microcatheter was loosened. With slight forward gentle traction with the right hand on the delivery micro guidewire with the left hand the delivery microcatheter was retrieved unsheathing the retrieval device.  The 071 aspiration catheter was engaged in the occluded right middle cerebral artery M1 segment. Continued aspiration was applied using an aspiration device with proximal flow arrest in the right internal carotid artery, and aspiration using a 60 mL syringe at the hub of the balloon guide catheter for approximately 2-1/2 minutes.  Thereafter, the combination of the retrieval device, the microcatheter, aspiration catheter was retrieved and removed as aspiration was applied at the hub of the balloon guide catheter. Large clot and small pieces were seen from within the aspiration catheter.  A control arteriogram performed through the balloon guide following reversal of flow arrest in the right internal carotid artery demonstrated wide patency of the superior and inferior divisions of the right middle cerebral artery. A control arteriogram performed following infusion of 25 mcg of nitroglycerin intra-arterially now demonstrated significantly improved caliber and flow through the superior and inferior divisions. There continued be slow flow involving the M3 M4 branches of the anterior of peri insular branches consistent with the site of the core seen on the CT perfusion studies.  A TICI 2C revascularization of the right MCA distribution had been achieved. The right anterior cerebral artery was now also  widely patent with a TICI 3 revascularization.  Patency of the right posterior communicating artery and the right posterior cerebral artery territory was intact.  Control arteriogram performed through the balloon guide in the right common carotid artery demonstrated free flow in the right internal carotid artery extra cranially and intracranially.  This was then removed. The 8 French Pinnacle sheath in the right groin was replaced with an 8 French Angio-Seal closure device for hemostasis. The distal pulses remained Dopplerable in both the DPs and posterior tibial arteries bilaterally unchanged.  Post procedure CT scan of the brain demonstrated contrast blush in the right basal ganglia region. No gross mass effect or midline shift was noted.  The patient was left intubated on account of the patient's neurologic condition prior to intubation. She was then transported to the neuro ICU for post thrombectomy care.  IMPRESSION: Status post endovascular revascularization of occluded right internal carotid artery supraclinoid segment, the right middle cerebral and the right anterior cerebral artery with 1 pass with a 3 mm x 20 mm Solitaire X retrieval device, contact aspiration and proximal flow arrest achieving a TICI 2C revascularization.  PLAN: Follow-up as per referring MD.   Electronically Signed   By: Julieanne Cotton M.D.   On: 07/27/2020 14:02    Time Spent  in minutes  30     Laverna PeaceShayla D Elka Satterfield M.D on 08/18/2020 at 11:11 AM  To page go to [email or web address] - password [email or web address]

## 2020-08-18 NOTE — Anesthesia Procedure Notes (Signed)
Procedure Name: Intubation Date/Time: 08/18/2020 1:17 PM Performed by: Dorie Rank, CRNA Pre-anesthesia Checklist: Patient identified, Emergency Drugs available, Suction available, Patient being monitored and Timeout performed Patient Re-evaluated:Patient Re-evaluated prior to induction Oxygen Delivery Method: Circle system utilized Preoxygenation: Pre-oxygenation with 100% oxygen Induction Type: IV induction Ventilation: Mask ventilation without difficulty Laryngoscope Size: Glidescope Grade View: Grade I Tube type: Oral Tube size: 7.0 mm Number of attempts: 1 Airway Equipment and Method: Stylet and Video-laryngoscopy Placement Confirmation: ETT inserted through vocal cords under direct vision,  positive ETCO2 and breath sounds checked- equal and bilateral Secured at: 22 cm Tube secured with: Tape Dental Injury: Teeth and Oropharynx as per pre-operative assessment  Difficulty Due To: Difficulty was anticipated and Difficult Airway- due to limited oral opening

## 2020-08-18 NOTE — Progress Notes (Signed)
Occupational Therapy Treatment Patient Details Name: Samantha Paul MRN: [id number] DOB: [birth year] Today's Date: 08/18/2020    History of present illness 74 y.o. female with history of atrial fibrillation, HTN, DM and stroke, who presents with acute onset of right gaze deviation, left-sided hemiplegia and ?dense receptive and expressive aphasia. 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 Pt received tPA and underwent R ICA and MCA thrombectomy on 8/1. Follow-up MRI demonstrating moderate R MCA as well as R ACA and PCA infarcts with trace petechial hemorrhage. Plan for PEG 8/24   OT comments  Patient supine in bed finishing up with PT upon entry.  Fatigued from PT session, but engaged in bed level ADL tasks.  Spouse translated at bedside, as was informed translator would be present but not.  Patient continues to require max assist for basic self care tasks (washing face), able to initiate once engaged with R UE but poor task attention to sustain/complete task.  Able to follow minimal simple commands with R UE. Patient with R gaze preference, unable to keep eyes open during session but able to follow commands to open eyes.  Educated spouse on positioning and ROM of L UE, voiced understanding to place on pillows.  Will follow acutely, dc plan remains appropriate. Goals updated, partially met 1/5.    Follow Up Recommendations  SNF;LTACH    Equipment Recommendations  None recommended by OT    Recommendations for Other Services      Precautions / Restrictions Precautions Precautions: Fall Precaution Comments: dense Lt hemiplegic with Rt gaze preference       Mobility Bed Mobility Overal bed mobility: Needs Assistance Bed Mobility: Rolling;Sidelying to Sit;Sit to Sidelying Rolling: Max assist;+2 for physical assistance Sidelying to sit: Total assist;+2 for safety/equipment;HOB elevated (from rt side trying to engage use of RUE)      Sit to sidelying: Max assist;+2 for physical assistance General bed mobility comments: pt grabbing rt rail with RUE to assist with rolling to her rt; grabbed rail with RUE when scooting up to [facility]  Transfers                      Balance Overall balance assessment: Needs assistance Sitting-balance support: Feet unsupported;Bilateral upper extremity supported (pt very short and feet not reaching the floor) Sitting balance-Leahy Scale: Poor Sitting balance - Comments: pt with RUE engaged once sitting EOB (using to support herself in sitting); very delayed reaction to move/adjust RUE when LOB posteriorly or to her rt allowed (required max assist to recover her balance) Postural control: Posterior lean;Right lateral lean                                 ADL either performed or assessed with clinical judgement   ADL Overall ADL's : Needs assistance/impaired     Grooming: Maximal assistance;Bed level;Wash/dry face Grooming Details (indicate cue type and reason): patient provided hand over hand support to wash face using R hand, patient able to initate minimally once guided but poor attention and ability to sustain task; able to open mouth with support when cueing for suction due to increased secretions                                 General ADL Comments: pt fatigued from EOB session  with PT      Vision   Additional Comments: Patient able to open eyes when cued, R gaze preference; support to turn head to midline but unable to achieve scan towards L side. Does not blink to threat or turn head to stimuli on L side.    Perception     Praxis      Cognition Arousal/Alertness: Lethargic (awake on arrival, but lethargic ) Behavior During Therapy: Flat affect Overall Cognitive Status: Difficult to assess Area of Impairment: Following commands;Attention                   Current Attention Level: Focused   Following Commands: Follows one step  commands with increased time;Follows one step commands inconsistently (with R UE )       General Comments: interpreter not present (did not show up for session), husband assisted as able but provides maximal cueing/gesturing for simple tasks         Exercises Exercises: Other exercises Other Exercises Other Exercises: L UE PROM from shoulder to hand, UE remains flaccid, edematous --positioned in elevation using pillows    Shoulder Instructions       General Comments husband present and supportive; further education on positioning of L UE and spouse able to report "they told me to keep her arms on pillows"     Pertinent Vitals/ Pain       Pain Assessment: Faces Faces Pain Scale: No hurt  Home Living                                          Prior Functioning/Environment              Frequency  Min 2X/week        Progress Toward Goals  OT Goals(current goals can now be found in the care plan section)  Progress towards OT goals: Not progressing toward goals - comment (lethargy )  Acute Rehab OT Goals Patient Stated Goal: pt unable ADL Goals Pt Will Perform Grooming: with mod assist;sitting Pt Will Perform Upper Body Bathing: with mod assist;sitting Additional ADL Goal #1: Pt will sustain attention to familiar self care activity x 3 min with mod cues. Additional ADL Goal #2: Pt will locate item/person on left side with min cueing at least 50% of the time. Additional ADL Goal #3: Family will be independent with L UE ROM and positioning.  Plan Discharge plan remains appropriate;Frequency remains appropriate    Co-evaluation                 AM-PAC OT "6 Clicks" Daily Activity     Outcome Measure   Help from another person eating meals?: Total Help from another person taking care of personal grooming?: A Lot Help from another person toileting, which includes using toliet, bedpan, or urinal?: Total Help from another person bathing  (including washing, rinsing, drying)?: A Lot Help from another person to put on and taking off regular upper body clothing?: Total Help from another person to put on and taking off regular lower body clothing?: Total 6 Click Score: 8    End of Session Equipment Utilized During Treatment: Oxygen  OT Visit Diagnosis: Unsteadiness on feet (R26.81);Cognitive communication deficit (R41.841);Hemiplegia and hemiparesis Symptoms and signs involving cognitive functions: Cerebral infarction Hemiplegia - Right/Left: Left Hemiplegia - dominant/non-dominant: Non-Dominant Hemiplegia - caused by: Cerebral infarction   Activity Tolerance Patient limited  by lethargy   Patient Left in bed;with call bell/phone within reach;with bed alarm set;with family/visitor present;with SCD's reapplied   Nurse Communication Mobility status;Precautions        Time: 0626-9485 OT Time Calculation (min): 16 min  Charges: OT General Charges $OT Visit: 1 Visit OT Treatments $Self Care/Home Management : 8-22 mins  Jolaine Artist, [place] Pager [phone] Office [place] 08/18/2020, 1:10 PM

## 2020-08-19 ENCOUNTER — Encounter (HOSPITAL_COMMUNITY): Payer: Self-pay | Admitting: General Surgery

## 2020-08-19 DIAGNOSIS — I69391 Dysphagia following cerebral infarction: Secondary | ICD-10-CM

## 2020-08-19 LAB — CBC WITH DIFFERENTIAL/PLATELET
Abs Immature Granulocytes: 0.14 10*3/uL — ABNORMAL HIGH (ref 0.00–0.07)
Basophils Absolute: 0.1 10*3/uL (ref 0.0–0.1)
Basophils Relative: 1 %
Eosinophils Absolute: 0.3 10*3/uL (ref 0.0–0.5)
Eosinophils Relative: 2 %
HCT: 27.3 % — ABNORMAL LOW (ref 36.0–46.0)
Hemoglobin: 8.2 g/dL — ABNORMAL LOW (ref 12.0–15.0)
Immature Granulocytes: 1 %
Lymphocytes Relative: 9 %
Lymphs Abs: 1.4 10*3/uL (ref 0.7–4.0)
MCH: 23.2 pg — ABNORMAL LOW (ref 26.0–34.0)
MCHC: 30 g/dL (ref 30.0–36.0)
MCV: 77.3 fL — ABNORMAL LOW (ref 80.0–100.0)
Monocytes Absolute: 1.3 10*3/uL — ABNORMAL HIGH (ref 0.1–1.0)
Monocytes Relative: 9 %
Neutro Abs: 11.9 10*3/uL — ABNORMAL HIGH (ref 1.7–7.7)
Neutrophils Relative %: 78 %
Platelets: 309 10*3/uL (ref 150–400)
RBC: 3.53 MIL/uL — ABNORMAL LOW (ref 3.87–5.11)
RDW: 23 % — ABNORMAL HIGH (ref 11.5–15.5)
WBC: 15.2 10*3/uL — ABNORMAL HIGH (ref 4.0–10.5)
nRBC: 0 % (ref 0.0–0.2)

## 2020-08-19 LAB — GLUCOSE, CAPILLARY
Glucose-Capillary: 157 mg/dL — ABNORMAL HIGH (ref 70–99)
Glucose-Capillary: 158 mg/dL — ABNORMAL HIGH (ref 70–99)
Glucose-Capillary: 160 mg/dL — ABNORMAL HIGH (ref 70–99)
Glucose-Capillary: 169 mg/dL — ABNORMAL HIGH (ref 70–99)
Glucose-Capillary: 174 mg/dL — ABNORMAL HIGH (ref 70–99)
Glucose-Capillary: 176 mg/dL — ABNORMAL HIGH (ref 70–99)
Glucose-Capillary: 177 mg/dL — ABNORMAL HIGH (ref 70–99)

## 2020-08-19 MED ORDER — APIXABAN 5 MG PO TABS
5.0000 mg | ORAL_TABLET | Freq: Two times a day (BID) | ORAL | Status: DC
  Administered 2020-08-19 – 2020-08-23 (×8): 5 mg
  Filled 2020-08-19 (×8): qty 1

## 2020-08-19 MED ORDER — FREE WATER
100.0000 mL | Freq: Three times a day (TID) | Status: DC
  Administered 2020-08-19 – 2020-08-23 (×13): 100 mL

## 2020-08-19 MED ORDER — APIXABAN 5 MG PO TABS: 5.0000 mg | ORAL_TABLET | Freq: Two times a day (BID) | ORAL | Status: DC

## 2020-08-19 MED ORDER — DILTIAZEM 12 MG/ML ORAL SUSPENSION
60.0000 mg | Freq: Four times a day (QID) | ORAL | Status: DC
  Administered 2020-08-19 – 2020-08-23 (×17): 60 mg
  Filled 2020-08-19 (×19): qty 6

## 2020-08-19 NOTE — TOC Progression Note (Signed)
Transition of Care [facility]) - Progression Note    Patient Details  Name: Samantha Paul MRN: [id number] Date of Birth: 1946/05/01  [facility] CM/SW Contact  Pollie Friar, RN Phone Number: 08/19/2020, 11:06 AM  Clinical Narrative:    CM met with the patients spouse and children yesterday with interpreter present. [facility]. CM updated [facility] and they have retracted their bed offer d/t the language barrier and need for interpreter.  CM reached out to the other facility that offered a bed: [facility] and they can provided the needed interpreter services. CM met today with the patient, spouse and interpreter. Spouse is in agreement with [facility]. [place] wont have a female bed until Sunday. Spouse and MD updated.  TOC will initiate insurance closer to Sunday.  TOC following.   Expected Discharge Plan: [place] Barriers to Discharge: Continued Medical Work up  Expected Discharge Plan and Services Expected Discharge Plan: [place] In-house Referral: Clinical Social Work Discharge Planning Services: CM Consult Post Acute Care Choice: [place] Living arrangements for the past 2 months: Single Family Home                                       Social Determinants of Health (SDOH) Interventions    Readmission Risk Interventions Readmission Risk Prevention Plan 07/03/2020 07/03/2020  Post Dischage Appt Complete -  Medication Screening Complete Complete  Transportation Screening Complete Complete

## 2020-08-19 NOTE — Plan of Care (Signed)
  Problem: Nutrition: Goal: Adequate nutrition will be maintained Outcome: Progressing   Problem: Pain Managment: Goal: General experience of comfort will improve Outcome: Progressing   Problem: Safety: Goal: Ability to remain free from injury will improve Outcome: Progressing   

## 2020-08-19 NOTE — Progress Notes (Signed)
STROKE TEAM PROGRESS NOTE   INTERVAL HISTORY Husband is at bedside. Pt neuro unchanged. WBC elevated again today however, Hb improving. Will restart eliquis today.     Vitals:   08/19/20 0411 08/19/20 0809 08/19/20 1138 08/19/20 1544  BP: 129/79 (!) 145/77 (!) 144/84 131/63  Pulse: 90 77 69 89  Resp: 18 18 19 18   Temp: 98.5 F (36.9 C) 98.4 F (36.9 C) 97.7 F (36.5 C) 98.3 F (36.8 C)  TempSrc: Oral Axillary Oral Axillary  SpO2: 100% 100% 100% 99%  Weight:      Height:       CBC:  Recent Labs  Lab 08/18/20 0258 08/19/20 0141  WBC 10.9* 15.2*  NEUTROABS 7.9* 11.9*  HGB 7.9* 8.2*  HCT 26.6* 27.3*  MCV 76.7* 77.3*  PLT 268 309   Basic Metabolic Panel:  Recent Labs  Lab 08/16/20 0203 08/17/20 0220  NA 133* 136  K 4.3 4.3  CL 104 107  CO2 22 21*  GLUCOSE 140* 89  BUN 31* 31*  CREATININE 1.30* 1.31*  CALCIUM 8.3* 8.4*   IMAGING past 24 hours No results found.  PHYSICAL EXAM  General - Well nourished, well developed elderly Asian lady, no acute distress  Ophthalmologic - fundi not visualized due to noncooperation.  Cardiovascular - irregularly irregular heart rate and rhythm.  Neuro -  Eyes initially closed, however easily open with voice and maintained opening. Nonverbal, did not follow commands on the right.  Eyes in right gaze position, not able to cross midline. Not blinking to visual threat on the left but blinking on the right, PERRL. Corneal reflex present, gag and cough present. Left lower facial weakness. Tongue protrusion not cooperative. Cortrak in place. On pain stimulation, active movement of RUE and RLE with RUE at least 4/5 and RLE at least 3-/5. LUE 0/5 and LLE 2-/5 withdraw on pain. DTR 1+ and bilateral positive babinski. Sensation not cooperative but brisk response to painful stimulation, coordination not cooperative and gait not tested.   ASSESSMENT/PLAN Ms. Dee Lavey is a 74 y.o. female with history of atrial fibrillation, HTN, DM and stroke  presenting with right gaze deviation, left-sided hemiplegia and dense receptive and expressive aphasia. She received tPA 07/26/2020 at 1259. Taken to IR for R ICA T occlusion.  Stroke:  R MCA and ACA infarct due to R tICA occlusion s/p tPA and IR w/ TICI2c-3, embolic secondary to known AF but not on AC for at least a week  Code Stroke CT head No acute abnormality.     CTA head & neck R tICA occlusion w/ min MCA flow. Some narrowing B VA and BA.   CT perfusion R frontal core 16 cc with penumbra 136 R MCA territory  Cerebral angio RT ICA T occlusion ->TICI 2C revascularization of RT MCA and a TICI 3 revascularization of RT ACA .  CT head post IR - new R frontal lobe hyper and hypointensity (infarct, edema, petechial hemorrhage)  MRI right MCA and ACA multifocal large infarcts with petechial hemo  CT repeat stable right MCA multifocal infarcts, minimal MLS.  2D Echo 07/03/2020 EF 65-70%. No source of embolus   LDL 90  HgbA1c 12.7  VTE prophylaxis - SCDs   Eliquis (apixaban) daily prior to admission, switch ASA to eliquis today.   Therapy recommendations: SNF  Disposition:  Pending  Sepsis likely due to C. difficile colitis w/ diarrhea Leukocytosis Fever  Code sepsis 8/19   WBC 20.2->22.3->21.3->20.1->49.9->35.5->21.7->12.8->10.1->10.9->15.2  TM 102.5->afebrile->afebrile  Lactic acid 1.5  C. diff Positive   Blood Cx 8/19 NGTD  UA > 50 RBC, 30 Protein. No WBC or bacteria  Urine Cx - 8/19 - no growth  CT chest, abd, pelvis - 08/14/20 - concerning for acute pyelonephritis   Rectal tube out 8/23  Flagyl 8/19>>  Cefepime 8/19>>8/25  Vancomycin 8/19>>  Microcytic anemia   Hgb 8.1->7.5->7.7->6.6 - 6.8 - PRBC - 8.1->7.7->7.3->7.3->7.5->7.9->8.2  FOB positive  Fe 15, Sat 5, Fer 634  Rectal tube remains in place  On iron pills  CBC monitoring  Atrial Fibrillation w/ RVR  Home anticoagulation:  Eliquis (apixaban) daily - not on for at least a week prior to  admission   On diltiazem 240, metoprolol 12.5 bid  PTA  On diltiazem 90mg  Q6  On metoprolol 50 bid  On ASA 81 -> now switch to eliquis  Hyperlipidemia  Home meds:  crestor 20, resumed in hospital  LDL 90, at goal < 70  Continue statin at discharge  Diabetes type II Uncontrolled Hypoglycemia episodes  Home meds:  lantus 20 daily, metformin 500 bid  HgbA1c 12.7, goal < 7.0  CBGs  SSI  On lantus 22, novolog 5U Q4h -> on hold around PEG placement  Hypoglycemia during night 8/22 Glucose 68 -> D50 -> 154  Hypoglycemia 8/23 pm glucose 58 -> D 50  Hypoglycemia 8/24 12pm glucose 68 -> D50  DM coordinator assistance appreciated  Dysphagia . Secondary to stroke . NPO . Cortrak removed post PEG . On TF @50  . PEG placed 8/24 . Speech on board   AKI  Creatinine 1.37->1.31->1.28->1.30->1.31  On TF @ 50 + Pro Source 45 ml daily  Off IVF  Hyponatremia, resolved  Na - 132->131->133->136  Close monitoring  Hx of stroke  06/2018 left pontine infarct likely secondary to small vessel disease source. CTA head and neck neg. EF 65-70%. LDL 140 and A1C 12.3. DAPT x 3 weeks and lipitor 40.  Other Stroke Risk Factors  Advanced age  Other Active Problems  Urinary retention. Possible pyelonephritis on CT abd. UA w/ large hgb. Urine Cx - 8/19 - no growth  Hospital day # 24  Neurology will sign off. Please call with questions. Pt will follow up with stroke clinic NP at [facility] in about 4 weeks after discharge. Thanks for the consult.   9/19, MD PhD Stroke Neurology 08/19/2020 5:39 PM  To contact Stroke Continuity provider, please refer to Marvel Plan. After hours, contact General Neurology

## 2020-08-19 NOTE — Progress Notes (Signed)
PROGRESS NOTE    Samantha Paul  WUJ:[id number] DOB: [birth year] DOA: 07/26/2020 PCP: Rometta Emery, MD    No chief complaint on file.   Brief Narrative:   Samantha Paul is a 74 y.o. year old female with medical history significant for atrial fibrillation with poor adherence to Eliquis, diabetes, HTN, prior CVA who presented on 07/26/2020 with acute onset right gaze deviation, left-sided hemiplegia receptive/expressive aphasia after unwitnessed fall heard by her husband who found her in the kitchen with the above findings.  Code stroke was called and CT head showed no acute findings, but CTA head/neck showed embolic occlusion of the right carotid with minimal flow seen in the right MCA territory with large penumbra remainder of the right MCA territory.  Patient was admitted to neuro ICU and underwent TPA thrombectomy patient was intubated for revascularization procedure and required prolonged mechanical ventilation due to poor mentation before successful extubation on 8/14.  Hospital course was also complicated by Klebsiella pneumonia (respiratory culture on 8/8) requiring course of Zosyn followed by ceftriaxone for total of 9 days (8/7-8/15)  TRH was consulted on 8/19 in the setting of code sepsis as patient was febrile with T-max of one 2.5, tachycardic, tachypneic, WBC of 49.9.  She was empirically started on vancomycin and cefepime for HCAP.  C. difficile was sent given diarrhea and was found to be positive so patient was started on oral vancomycin and IV Flagyl.  In work-up CT abdomen also showed concern for potential pyelonephritis, but UA was essentially unremarkable.  Assessment & Plan:   Principal Problem:   Sepsis (HCC) Active Problems:   Weakness generalized   Hyperlipidemia   Essential hypertension   Diabetes mellitus type 2 in nonobese (HCC)   Acute blood loss anemia   Stroke [facility])   Stroke due to embolism (HCC)   Middle cerebral artery embolism, right   Acute respiratory failure  with hypoxia (HCC)   DNR (do not resuscitate) discussion   Palliative care by specialist   C. difficile colitis   Acute pyelonephritis   Dysphagia due to recent cerebrovascular accident   Sepsis secondary to C. difficile colitis Afebrile, blood cultures remain unremarkable, Continue with oral vancomycin and IV Flagyl to complete the course.    Possible pyelonephritis. CT of the abdomen on 8/20 showed perinephric fat stranding concerning for acute pyelonephritis unfortunately no culture data are unable to assess urinary symptoms giving mentation after CVA.  Recommend 7 days of IV antibiotics given hospitalization and risk for MDR.    Acute right MCA stroke, right ICA total occlusion, s/p TPA and revascularization by IR on 07/26/2020 Patient has dense left hemiplegia and unable to handle oral secretions or support herself, continues to have poor ability to clear oral secretions, high risk for aspiration pneumonia.  Patient has a PEG tube and palliative care consulted to help with goals of care discussion.     Atrial fibrillation Currently rate controlled Continue with Eliquis and Cardizem has been decreased to 60 mg every 6 hours for pause of 2 seconds. Continue with Lopressor 50 mg twice daily.   Acute on chronic anemia Continue with iron supplementation.   Type 2 diabetes mellitus A1c is 8.7. Continue with sliding scale and Lantus   . Hyperlipidemia Continue with Crestor.   DVT prophylaxis: (Lovenox) Code Status: (full code.  Family Communication: Husband at bedside Disposition:   Status is: Inpatient  Remains inpatient appropriate because:IV treatments appropriate due to intensity of illness or inability to take PO   Dispo:  The patient is from: Home              Anticipated d/c is to: SNF              Anticipated d/c date is: > 3 days              Patient currently is not medically stable to d/c.       Consultants:   Neurology  Surgery.    Procedures:   Antimicrobials:  Anti-infectives (From admission, onward)   Start     Dose/Rate Route Frequency Ordered Stop   08/15/20 0500  vancomycin (VANCOCIN) IVPB 1000 mg/200 mL premix  Status:  Discontinued        1,000 mg 200 mL/hr over 60 Minutes Intravenous Every 36 hours 08/13/20 1657 08/16/20 1255   08/14/20 2000  vancomycin (VANCOCIN) 50 mg/mL oral solution 500 mg        500 mg Per Tube Every 6 hours 08/14/20 1820 08/28/20 0159   08/14/20 0730  vancomycin (VANCOCIN) 50 mg/mL oral solution 500 mg  Status:  Discontinued        500 mg Oral Every 6 hours 08/14/20 0649 08/14/20 1820   08/14/20 0730  metroNIDAZOLE (FLAGYL) IVPB 500 mg        500 mg 100 mL/hr over 60 Minutes Intravenous Every 8 hours 08/14/20 0649 08/28/20 0729   08/13/20 1700  vancomycin (VANCOREADY) IVPB 1250 mg/250 mL        1,250 mg 166.7 mL/hr over 90 Minutes Intravenous  Once 08/13/20 1656 08/13/20 1958   08/13/20 1630  ceFEPIme (MAXIPIME) 2 g in sodium chloride 0.9 % 100 mL IVPB        2 g 200 mL/hr over 30 Minutes Intravenous Every 12 hours 08/13/20 1622 08/19/20 2359   08/13/20 1600  ampicillin-sulbactam (UNASYN) 1.5 g in sodium chloride 0.9 % 100 mL IVPB  Status:  Discontinued        1.5 g 200 mL/hr over 30 Minutes Intravenous Every 8 hours 08/13/20 1447 08/13/20 1558   08/06/20 1630  cefTRIAXone (ROCEPHIN) 2 g in sodium chloride 0.9 % 100 mL IVPB        2 g 200 mL/hr over 30 Minutes Intravenous Every 24 hours 08/06/20 1325 08/09/20 1701   08/03/20 1630  cefTRIAXone (ROCEPHIN) 2 g in sodium chloride 0.9 % 100 mL IVPB  Status:  Discontinued        2 g 200 mL/hr over 30 Minutes Intravenous Every 24 hours 08/03/20 1547 08/06/20 1325   08/01/20 1200  piperacillin-tazobactam (ZOSYN) IVPB 3.375 g  Status:  Discontinued        3.375 g 12.5 mL/hr over 240 Minutes Intravenous Every 8 hours 08/01/20 1134 08/03/20 1509   07/26/20 1336  ceFAZolin (ANCEF) 2-4 GM/100ML-% IVPB       Note to Pharmacy: Teofilo PodJohnson,  Ericka   : cabinet override      07/26/20 1336 07/27/20 0144          Subjective: No new complaints. Husband at bedside. No new questions at this time.   Objective: Vitals:   08/18/20 2346 08/19/20 0411 08/19/20 0809 08/19/20 1138  BP: (!) 125/99 129/79 (!) 145/77 (!) 144/84  Pulse: 77 90 77 69  Resp: 18 18 18 19   Temp: 98.7 F (37.1 C) 98.5 F (36.9 C) 98.4 F (36.9 C) 97.7 F (36.5 C)  TempSrc: Axillary Oral Axillary Oral  SpO2: 100% 100% 100% 100%  Weight:      Height:  Intake/Output Summary (Last 24 hours) at 08/19/2020 1438 Last data filed at 08/19/2020 0813 Gross per 24 hour  Intake 736.38 ml  Output 1500 ml  Net -763.62 ml   Filed Weights   08/11/20 0500 08/12/20 0500 08/16/20 0331  Weight: 67.2 kg 68.2 kg 72.6 kg    Examination:  General exam: Appears calm and comfortable  Respiratory system: Clear to auscultation. Respiratory effort normal. Cardiovascular system: S1 & S2 heard, RRR. No JVD,  No pedal edema. Gastrointestinal system: Abdomen is nondistended, soft and nontender. . Normal bowel sounds heard. Central nervous system: Alert and opens eyes to verbal commands.  Extremities: no pedal edema.  Skin: No rashes,  Psychiatry: cannot be assessed.     Data Reviewed: I have personally reviewed following labs and imaging studies  CBC: Recent Labs  Lab 08/13/20 1634 08/13/20 2200 08/15/20 0302 08/16/20 0203 08/17/20 0844 08/18/20 0258 08/19/20 0141  WBC 49.9*   < > 21.7* 12.8* 10.1 10.9* 15.2*  NEUTROABS 44.4*  --   --   --   --  7.9* 11.9*  HGB 6.8*   < > 7.3* 7.3* 7.5* 7.9* 8.2*  HCT 23.5*   < > 24.2* 24.6* 25.7* 26.6* 27.3*  MCV 73.9*   < > 75.6* 75.9* 76.5* 76.7* 77.3*  PLT 399   < > 238 306 247 268 309   < > = values in this interval not displayed.    Basic Metabolic Panel: Recent Labs  Lab 08/13/20 1352 08/14/20 1321 08/15/20 0302 08/16/20 0203 08/17/20 0220  NA 132* 131* 131* 133* 136  K 4.6 4.4 4.5 4.3 4.3  CL 101  101 102 104 107  CO2 21* 19* 21* 22 21*  GLUCOSE 120* 174* 146* 140* 89  BUN 35* 31* 31* 31* 31*  CREATININE 1.37* 1.31* 1.28* 1.30* 1.31*  CALCIUM 8.2* 8.2* 8.1* 8.3* 8.4*    GFR: Estimated Creatinine Clearance: 36.2 mL/min (A) (by C-G formula based on SCr of 1.31 mg/dL (H)).  Liver Function Tests: Recent Labs  Lab 08/15/20 0302  AST 28  ALT 43  ALKPHOS 159*  BILITOT 0.3  PROT 5.5*  ALBUMIN 1.7*    CBG: Recent Labs  Lab 08/18/20 1920 08/18/20 2344 08/19/20 0406 08/19/20 0807 08/19/20 1136  GLUCAP 149* 166* 158* 157* 160*     Recent Results (from the past 240 hour(s))  Urine Culture     Status: None   Collection Time: 08/13/20 10:43 AM   Specimen: Urine, Random  Result Value Ref Range Status   Specimen Description URINE, RANDOM  Final   Special Requests NONE  Final   Culture   Final    NO GROWTH Performed at [facility], [address] [place], [place] [address]    Report Status 08/14/2020 FINAL  Final  Culture, blood (routine x 2)     Status: None   Collection Time: 08/13/20  1:52 PM   Specimen: BLOOD RIGHT HAND  Result Value Ref Range Status   Specimen Description BLOOD RIGHT HAND  Final   Special Requests   Final    BOTTLES DRAWN AEROBIC ONLY Blood Culture adequate volume   Culture   Final    NO GROWTH 5 DAYS Performed at [facility], [address] [place], [place] [address]    Report Status 08/18/2020 FINAL  Final  Culture, blood (routine x 2)     Status: None   Collection Time: 08/13/20  1:58 PM   Specimen: BLOOD RIGHT HAND  Result Value Ref Range Status   Specimen Description BLOOD RIGHT HAND  Final   Special Requests   Final    BOTTLES DRAWN AEROBIC ONLY Blood Culture adequate volume   Culture   Final    NO GROWTH 5 DAYS Performed at [facility], [address] [place], [place] [address]    Report Status 08/18/2020 FINAL  Final  C Difficile Quick Screen (NO PCR Reflex)     Status: Abnormal   Collection Time:  08/14/20  1:48 AM   Specimen: STOOL  Result Value Ref Range Status   C Diff antigen POSITIVE (A) NEGATIVE Final   C Diff toxin POSITIVE (A) NEGATIVE Final   C Diff interpretation Toxin producing C. difficile detected.  Final    Comment: CRITICAL RESULT CALLED TO, READ BACK BY AND VERIFIED WITH: Lutricia Horsfall RN 08/14/20 0501 JDW Performed at [facility], [address] [place], [place] [address]          Radiology Studies: No results found.      Scheduled Meds: .  stroke: mapping our early stages of recovery book   Does not apply Once  . aspirin  81 mg Per Tube Daily  . bethanechol  10 mg Per Tube TID  . chlorhexidine  15 mL Mouth Rinse BID  . Chlorhexidine Gluconate Cloth  6 each Topical Daily  . diltiazem  60 mg Per Tube Q6H  . feeding supplement (PROSource TF)  45 mL Per Tube Daily  . ferrous sulfate  300 mg Per Tube TID  . free water  100 mL Per Tube Q8H  . insulin aspart  0-9 Units Subcutaneous Q4H  . insulin glargine  22 Units Subcutaneous Daily  . mouth rinse  15 mL Mouth Rinse q12n4p  . metoprolol tartrate  50 mg Per Tube BID  . rosuvastatin  20 mg Per Tube Daily  . vancomycin  500 mg Per Tube Q6H   Continuous Infusions: . sodium chloride Stopped (08/19/20 0452)  . ceFEPime (MAXIPIME) IV 200 mL/hr at 08/19/20 0514  . feeding supplement (JEVITY 1.2 CAL) 1,000 mL (08/19/20 0456)  . metronidazole 500 mg (08/19/20 0906)     LOS: 24 days        Kathlen Mody, MD [facility]   To contact the attending provider between 7A-7P or the covering provider during after hours 7P-7A, please log into the web site [email or web address] and access using universal Man password for that web site. If you do not have the password, please call the hospital operator.  08/19/2020, 2:38 PM

## 2020-08-19 NOTE — Progress Notes (Signed)
  Speech Language Pathology Treatment: Dysphagia  Patient Details Name: Samantha Paul MRN: [id number] DOB: Oct 10, 1946 Today's Date: 08/19/2020 Time: 4259-5638 SLP Time Calculation (min) (ACUTE ONLY): 13 min  Assessment / Plan / Recommendation Clinical Impression  Pt was seen for dysphagia treatment with her husband present. Pt's husband reported that the pt has been tired today and sleeping frequently. She roused easily with verbal and tactile stimulation. Bolus manipulation of ice chips was limited. She swallowed thin liquids via tsp but consistently demonstrated anterior spillage and throat clearing thereafter, suggesting possible aspiration. Pt exhibited difficulty maintaining an adequate level of alertness and it was agreed that additional boluses be deferred to allow pt to rest. SLP will continue to follow pt.    HPI HPI: Patient is a 74 y.o. female with PMH: atrial fibrillation, HTN, DM, CVA in 2019, who presented to hospital with right gaze deviation, left sided hemiplegia and dense receptive and expressive aphasiaCTA head and neck showed embolic occulsion of right carotid terminus, minimal flow was seen in right MCA territory. MRI revealed multifocal ischemic infarction within the right MCA territory, greatest at the frontal operculum and insula; large area of infarction in right basal gangliaa with petechial hemorrhage versus contrast staining, punctate focus of acute ischemia in left occipital lobe. PEG placed 8/24      SLP Plan  Continue with current plan of care       Recommendations  Diet recommendations: NPO Medication Administration: Via alternative means                Oral Care Recommendations: Oral care QID Follow up Recommendations: 24 hour supervision/assistance;Inpatient Rehab;Skilled Nursing facility SLP Visit Diagnosis: Dysphagia, unspecified (R13.10) Plan: Continue with current plan of care       Atoya Andrew I. Vear Clock, MS, CCC-SLP Acute Rehabilitation  Services Office number [phone] Pager [phone]                 Scheryl Marten 08/19/2020, 4:38 PM

## 2020-08-19 NOTE — Final Consult Note (Signed)
Consultant Final Sign-Off Note    Assessment/Final recommendations  Samantha Paul is a 74 y.o. female followed by me for placement of PEG tube in the setting of recent CVA and dysphagia. PEG performed yesterday 08/18/20 without complications. Tolerating TF at goal. Having bowel function. Abdominal binder in place. G-tube care orders and flush orders have been placed.   Wound care (if applicable): local care to PEG tube site - keep surrounding skin clean and dry, change drain sponge daily and PRN.    Diet at discharge: per primary team   Activity at discharge: per primary team   Follow-up appointment:  None required. May call our office as needed/for future PEG removal if neurologic condition improves.   Pending results:  Unresulted Labs (From admission, onward)          Start     Ordered   08/18/20 0500  CBC with Differential/Platelet  Daily,   R     Question:  Specimen collection method  Answer:  Lab=Lab collect   08/17/20 0752   08/12/20 0500  CBC  Tomorrow morning,   R       Question:  Specimen collection method  Answer:  Lab=Lab collect   08/11/20 1419   Unscheduled  Occult blood card to lab, stool  As needed,   R      08/13/20 1837           Medication recommendations:   Other recommendations:    Thank you for allowing [place] to participate in the care of your patient!  Please consult [place] again if you have further needs for your patient.  Samantha Paul 08/19/2020 8:10 AM    Subjective   Non-verbal  Objective  Vital signs in last 24 hours: Temp:  [97 F (36.1 C)-98.7 F (37.1 C)] 98.4 F (36.9 C) (08/25 0809) Pulse Rate:  [73-156] 77 (08/25 0809) Resp:  [16-24] 18 (08/25 0809) BP: (106-154)/(65-117) 145/77 (08/25 0809) SpO2:  [100 %] 100 % (08/25 0809)  General: NAD Abd: soft, non-tender, PEG tube site clean and dry, no drainage, no erythema. TF running @ 50 cc/hr. +nonbloody BM   Pertinent labs and Studies: Recent Labs    08/17/20 0844 08/18/20 0258  08/19/20 0141  WBC 10.1 10.9* 15.2*  HGB 7.5* 7.9* 8.2*  HCT 25.7* 26.6* 27.3*   BMET Recent Labs    08/17/20 0220  NA 136  K 4.3  CL 107  CO2 21*  GLUCOSE 89  BUN 31*  CREATININE 1.31*  CALCIUM 8.4*   No results for input(s): LABURIN in the last 72 hours. Results for orders placed or performed during the hospital encounter of 07/26/20  SARS Coronavirus 2 by RT PCR (hospital order, performed in [facility] hospital lab) Nasopharyngeal Nasopharyngeal Swab     Status: None   Collection Time: 07/26/20 12:56 PM   Specimen: Nasopharyngeal Swab  Result Value Ref Range Status   SARS Coronavirus 2 NEGATIVE NEGATIVE Final    Comment: (NOTE) SARS-CoV-2 target nucleic acids are NOT DETECTED.  The SARS-CoV-2 RNA is generally detectable in upper and lower respiratory specimens during the acute phase of infection. The lowest concentration of SARS-CoV-2 viral copies this assay can detect is 250 copies / mL. A negative result does not preclude SARS-CoV-2 infection and should not be used as the sole basis for treatment or other patient management decisions.  A negative result may occur with improper specimen collection / handling, submission of specimen other than nasopharyngeal swab, presence of viral mutation(s) within  the areas targeted by this assay, and inadequate number of viral copies (<250 copies / mL). A negative result must be combined with clinical observations, patient history, and epidemiological information.  Fact Sheet for Patients:   BoilerBrush.com.cy  Fact Sheet for Healthcare Providers: https://pope.com/  This test is not yet approved or  cleared by the [place] FDA and has been authorized for detection and/or diagnosis of SARS-CoV-2 by FDA under an Emergency Use Authorization (EUA).  This EUA will remain in effect (meaning this test can be used) for the duration of the COVID-19 declaration under Section  564(b)(1) of the Act, 21 U.S.C. section 360bbb-3(b)(1), unless the authorization is terminated or revoked sooner.  Performed at [facility], [address], [place], [place] [address]   MRSA PCR Screening     Status: None   Collection Time: 07/26/20  5:43 PM   Specimen: Nasopharyngeal  Result Value Ref Range Status   MRSA by PCR NEGATIVE NEGATIVE Final    Comment:        The GeneXpert MRSA Assay (FDA approved for NASAL specimens only), is one component of a comprehensive MRSA colonization surveillance program. It is not intended to diagnose MRSA infection nor to guide or monitor treatment for MRSA infections. Performed at [facility], [address], [place], [place] [address]   Culture, blood (Routine X 2) w Reflex to ID Panel     Status: None   Collection Time: 08/01/20  6:24 PM   Specimen: BLOOD LEFT HAND  Result Value Ref Range Status   Specimen Description BLOOD LEFT HAND  Final   Special Requests   Final    BOTTLES DRAWN AEROBIC AND ANAEROBIC Blood Culture adequate volume   Culture   Final    NO GROWTH 5 DAYS Performed at [facility], [address] [place], [place] [address]    Report Status 08/06/2020 FINAL  Final  Culture, blood (Routine X 2) w Reflex to ID Panel     Status: None   Collection Time: 08/01/20  6:24 PM   Specimen: BLOOD  Result Value Ref Range Status   Specimen Description BLOOD RIGHT ARM  Final   Special Requests   Final    BOTTLES DRAWN AEROBIC AND ANAEROBIC Blood Culture adequate volume   Culture   Final    NO GROWTH 5 DAYS Performed at [facility], [address], [place], [place] [address]    Report Status 08/06/2020 FINAL  Final  Culture, respiratory (non-expectorated)     Status: None   Collection Time: 08/02/20  3:11 AM   Specimen: Tracheal Aspirate; Respiratory  Result Value Ref Range Status   Specimen Description TRACHEAL ASPIRATE  Final   Special Requests NONE  Final   Gram Stain   Final    ABUNDANT  WBC PRESENT, PREDOMINANTLY PMN FEW GRAM POSITIVE COCCI Performed at [facility], [address], [place], [place] [address]    Culture RARE KLEBSIELLA PNEUMONIAE  Final   Report Status 08/04/2020 FINAL  Final   Organism ID, Bacteria KLEBSIELLA PNEUMONIAE  Final      Susceptibility   Klebsiella pneumoniae - MIC*    AMPICILLIN >=32 RESISTANT Resistant     CEFAZOLIN <=4 SENSITIVE Sensitive     CEFEPIME <=0.12 SENSITIVE Sensitive     CEFTAZIDIME <=1 SENSITIVE Sensitive     CEFTRIAXONE <=0.25 SENSITIVE Sensitive     CIPROFLOXACIN <=0.25 SENSITIVE Sensitive     GENTAMICIN <=1 SENSITIVE Sensitive  IMIPENEM 0.5 SENSITIVE Sensitive     TRIMETH/SULFA <=20 SENSITIVE Sensitive     AMPICILLIN/SULBACTAM 8 SENSITIVE Sensitive     PIP/TAZO <=4 SENSITIVE Sensitive     * RARE KLEBSIELLA PNEUMONIAE  Culture, Urine     Status: Abnormal   Collection Time: 08/03/20 10:20 AM   Specimen: Urine, Random  Result Value Ref Range Status   Specimen Description URINE, RANDOM  Final   Special Requests NONE  Final   Culture (A)  Final    <10,000 COLONIES/mL INSIGNIFICANT GROWTH Performed at [facility], [address] [place], [place] [address]    Report Status 08/04/2020 FINAL  Final  Urine Culture     Status: None   Collection Time: 08/13/20 10:43 AM   Specimen: Urine, Random  Result Value Ref Range Status   Specimen Description URINE, RANDOM  Final   Special Requests NONE  Final   Culture   Final    NO GROWTH Performed at [facility], [address], [place], [place] [address]    Report Status 08/14/2020 FINAL  Final  Culture, blood (routine x 2)     Status: None   Collection Time: 08/13/20  1:52 PM   Specimen: BLOOD RIGHT HAND  Result Value Ref Range Status   Specimen Description BLOOD RIGHT HAND  Final   Special Requests   Final    BOTTLES DRAWN AEROBIC ONLY Blood Culture adequate volume   Culture   Final    NO GROWTH 5 DAYS Performed at [facility], [address] [place], [place] [address]    Report Status 08/18/2020 FINAL  Final  Culture, blood (routine x 2)     Status: None   Collection Time: 08/13/20  1:58 PM   Specimen: BLOOD RIGHT HAND  Result Value Ref Range Status   Specimen Description BLOOD RIGHT HAND  Final   Special Requests   Final    BOTTLES DRAWN AEROBIC ONLY Blood Culture adequate volume   Culture   Final    NO GROWTH 5 DAYS Performed at [facility], [address], [place], [place] [address]    Report Status 08/18/2020 FINAL  Final  C Difficile Quick Screen (NO PCR Reflex)     Status: Abnormal   Collection Time: 08/14/20  1:48 AM   Specimen: STOOL  Result Value Ref Range Status   C Diff antigen POSITIVE (A) NEGATIVE Final   C Diff toxin POSITIVE (A) NEGATIVE Final   C Diff interpretation Toxin producing C. difficile detected.  Final    Comment: CRITICAL RESULT CALLED TO, READ BACK BY AND VERIFIED WITH: Lutricia Horsfall RN 08/14/20 0501 JDW Performed at [facility], [address], [place], [place] [address]     Imaging: No results found.

## 2020-08-19 NOTE — Progress Notes (Signed)
ANTICOAGULATION CONSULT NOTE - Initial Consult  Pharmacy Consult for Eliquis Indication: atrial fibrillation  Allergies  Allergen Reactions  . Metformin And Related Diarrhea    Abdominal pain and diarrhea    Patient Measurements: Height: 5\' 2"  (157.5 cm) Weight: 72.6 kg (160 lb 0.9 oz) IBW/kg (Calculated) : 50.1  Vital Signs: Temp: 98.3 F (36.8 C) (08/25 1544) Temp Source: Axillary (08/25 1544) BP: 131/63 (08/25 1544) Pulse Rate: 89 (08/25 1544)  Labs: Recent Labs    08/17/20 0220 08/17/20 0844 08/17/20 0844 08/18/20 0258 08/19/20 0141  HGB  --  7.5*   < > 7.9* 8.2*  HCT  --  25.7*  --  26.6* 27.3*  PLT  --  247  --  268 309  CREATININE 1.31*  --   --   --   --    < > = values in this interval not displayed.    Estimated Creatinine Clearance: 36.2 mL/min (A) (by C-G formula based on SCr of 1.31 mg/dL (H)).   Medical History: Past Medical History:  Diagnosis Date  . Afib (HCC) 06/2020   Diagnosed 06/2020, on cardizem, metoprolol, eliquis  . Back pain   . CVA (cerebral vascular accident) (HCC) 07/26/2020   R MCA/ACA CVA  . DM II (diabetes mellitus, type II), controlled (HCC)   . HTN (hypertension)   . Knee pain   . Stroke [facility]) ?   residual mild right sided weakness     Medications:  Scheduled:  .  stroke: mapping our early stages of recovery book   Does not apply Once  . apixaban  5 mg Oral BID  . bethanechol  10 mg Per Tube TID  . chlorhexidine  15 mL Mouth Rinse BID  . Chlorhexidine Gluconate Cloth  6 each Topical Daily  . diltiazem  60 mg Per Tube Q6H  . feeding supplement (PROSource TF)  45 mL Per Tube Daily  . ferrous sulfate  300 mg Per Tube TID  . free water  100 mL Per Tube Q8H  . insulin aspart  0-9 Units Subcutaneous Q4H  . insulin glargine  22 Units Subcutaneous Daily  . mouth rinse  15 mL Mouth Rinse q12n4p  . metoprolol tartrate  50 mg Per Tube BID  . rosuvastatin  20 mg Per Tube Daily  . vancomycin  500 mg Per Tube Q6H     Assessment: 74 yo female on Eliquis previously for afib.  Now s/p stroke.  Doses were held on admission, but per MD okay to resume this evening.  Goal of Therapy:  Monitor platelets by anticoagulation protocol: Yes   Plan:  1. Start Eliquis 5 mg po BID.  61, Reece Leader, BCCP Clinical Pharmacist  08/19/2020 6:31 PM   [facility] pharmacy phone numbers are listed on [email or web address]

## 2020-08-19 NOTE — Progress Notes (Signed)
Nutrition Follow-up  DOCUMENTATION CODES:   Not applicable  INTERVENTION:  ContinueTube feeding via PEG: Jevity1.2 at 50 ml/h(1200 ml per day) Prosource TF 45 ml daily Free water per MD, currently 168m Q8H  Provides 1480 kcal, 77 gm protein, 973 ml free water daily (12738mtotal free water with flushes)   NUTRITION DIAGNOSIS:   Inadequate oral intake related to inability to eat as evidenced by NPO status.  Ongoing  GOAL:   Patient will meet greater than or equal to 90% of their needs  Met with TF  MONITOR:   TF tolerance, Diet advancement, Labs, Weight trends, I & O's  REASON FOR ASSESSMENT:   Consult, Ventilator Enteral/tube feeding initiation and management  ASSESSMENT:   Pt with PMH of HTN, afib, DM admitted with R MCA stroke s/p tPA and thrombectomy.  8/1 pt intubated 8/4 Cortrak placed (gastric) 8/14 pt extubated 8/19 pt found to have C. Diff 8/24 PEG placed   Discussed pt with RN. Pt tolerating TF well.   Current TFQH:UTMLYY5.0t 50 ml/h,Prosource TF 45 ml daily Provides 1480 kcal, 77 gm protein, 973 ml free water daily  Labs: CBGs 157-166 Medications: Novolog, Lantus  Diet Order:   Diet Order    None      EDUCATION NEEDS:   No education needs have been identified at this time  Skin:  Skin Assessment: Reviewed RN Assessment  Last BM:  8/22  Height:   Ht Readings from Last 1 Encounters:  07/26/20 5' 2"  (1.575 m)    Weight:   Wt Readings from Last 1 Encounters:  08/16/20 72.6 kg    Ideal Body Weight:  50 kg  BMI:  Body mass index is 29.27 kg/m.  Estimated Nutritional Needs:   Kcal:  1500-1700  Protein:  75-90 grams  Fluid:  >1.5 L/day    AmLarkin InaMS, RD, LDN RD pager number and weekend/on-call pager number located in [email or web address]

## 2020-08-19 NOTE — Addendum Note (Signed)
Addendum  created 08/19/20 0907 by Bethena Midget, MD   Attestation recorded in Intraprocedure, Intraprocedure Attestations filed, Intraprocedure Event edited

## 2020-08-20 LAB — CBC WITH DIFFERENTIAL/PLATELET
Abs Immature Granulocytes: 0.17 10*3/uL — ABNORMAL HIGH (ref 0.00–0.07)
Basophils Absolute: 0.1 10*3/uL (ref 0.0–0.1)
Basophils Relative: 1 %
Eosinophils Absolute: 0.4 10*3/uL (ref 0.0–0.5)
Eosinophils Relative: 4 %
HCT: 28.5 % — ABNORMAL LOW (ref 36.0–46.0)
Hemoglobin: 8.5 g/dL — ABNORMAL LOW (ref 12.0–15.0)
Immature Granulocytes: 2 %
Lymphocytes Relative: 14 %
Lymphs Abs: 1.5 10*3/uL (ref 0.7–4.0)
MCH: 22.9 pg — ABNORMAL LOW (ref 26.0–34.0)
MCHC: 29.8 g/dL — ABNORMAL LOW (ref 30.0–36.0)
MCV: 76.8 fL — ABNORMAL LOW (ref 80.0–100.0)
Monocytes Absolute: 1.1 10*3/uL — ABNORMAL HIGH (ref 0.1–1.0)
Monocytes Relative: 10 %
Neutro Abs: 7.3 10*3/uL (ref 1.7–7.7)
Neutrophils Relative %: 69 %
Platelets: 263 10*3/uL (ref 150–400)
RBC: 3.71 MIL/uL — ABNORMAL LOW (ref 3.87–5.11)
RDW: 23.3 % — ABNORMAL HIGH (ref 11.5–15.5)
WBC: 10.5 10*3/uL (ref 4.0–10.5)
nRBC: 0 % (ref 0.0–0.2)

## 2020-08-20 LAB — GLUCOSE, CAPILLARY
Glucose-Capillary: 135 mg/dL — ABNORMAL HIGH (ref 70–99)
Glucose-Capillary: 157 mg/dL — ABNORMAL HIGH (ref 70–99)
Glucose-Capillary: 143 mg/dL — ABNORMAL HIGH (ref 70–99)
Glucose-Capillary: 142 mg/dL — ABNORMAL HIGH (ref 70–99)
Glucose-Capillary: 183 mg/dL — ABNORMAL HIGH (ref 70–99)

## 2020-08-20 LAB — PATHOLOGIST SMEAR REVIEW

## 2020-08-20 MED ORDER — VANCOMYCIN 50 MG/ML ORAL SOLUTION
125.0000 mg | Freq: Four times a day (QID) | ORAL | Status: DC
  Administered 2020-08-20 – 2020-08-23 (×13): 125 mg
  Filled 2020-08-20 (×18): qty 2.5

## 2020-08-20 NOTE — Progress Notes (Signed)
PROGRESS NOTE    Samantha Paul  RDE:[id number] DOB: 05/20/1946 DOA: 07/26/2020 PCP: Rometta Emery, MD    No chief complaint on file.   Brief Narrative:   Samantha Paul is a 74 y.o. year old female with medical history significant for atrial fibrillation with poor adherence to Eliquis, diabetes, HTN, prior CVA who presented on 07/26/2020 with acute onset right gaze deviation, left-sided hemiplegia receptive/expressive aphasia after unwitnessed fall heard by her husband who found her in the kitchen with the above findings.  Code stroke was called and CT head showed no acute findings, but CTA head/neck showed embolic occlusion of the right carotid with minimal flow seen in the right MCA territory with large penumbra remainder of the right MCA territory.  Patient was admitted to neuro ICU and underwent TPA thrombectomy patient was intubated for revascularization procedure and required prolonged mechanical ventilation due to poor mentation before successful extubation on 8/14.  Hospital course was also complicated by Klebsiella pneumonia (respiratory culture on 8/8) requiring course of Zosyn followed by ceftriaxone for total of 9 days (8/7-8/15)  TRH was consulted on 8/19 in the setting of code sepsis as patient was febrile with T-max of one 2.5, tachycardic, tachypneic, WBC of 49.9.  She was empirically started on vancomycin and cefepime for HCAP.  C. difficile was sent given diarrhea and was found to be positive so patient was started on oral vancomycin and IV Flagyl.  In work-up CT abdomen also showed concern for potential pyelonephritis, but UA was essentially unremarkable.     Assessment & Plan:   Principal Problem:   Sepsis (HCC) Active Problems:   Weakness generalized   Hyperlipidemia   Essential hypertension   Diabetes mellitus type 2 in nonobese (HCC)   Acute blood loss anemia   Stroke [facility])   Stroke due to embolism (HCC)   Middle cerebral artery embolism, right   Acute respiratory  failure with hypoxia (HCC)   DNR (do not resuscitate) discussion   Palliative care by specialist   C. difficile colitis   Acute pyelonephritis   Dysphagia due to recent cerebrovascular accident   Sepsis secondary to C. difficile colitis Afebrile, blood cultures remain unremarkable, Decrease oral vancomycin to 125 mg every 6 hours and IV Flagyl for another 3 days to complete a course of 10 days. Patient continues to remain afebrile and sepsis physiology has resolved.    Possible pyelonephritis. CT of the abdomen on 8/20 showed perinephric fat stranding concerning for acute pyelonephritis unfortunately no culture data are unable to assess urinary symptoms giving mentation after CVA.  Patient completed 7 days of IV cefepime, given hospitalization and risk for MDR.    Acute right MCA stroke, right ICA total occlusion, s/p TPA and revascularization by IR on 07/26/2020 Patient has dense left hemiplegia and unable to handle oral secretions or support herself, continues to have poor ability to clear oral secretions, high risk for aspiration pneumonia.  Patient has a PEG tube and palliative care consulted to help with goals of care discussion. PT OT evaluation recommending SNF and she has a bed that will open up on Sunday.    Atrial fibrillation Currently rate controlled Continue with Eliquis and Cardizem has been decreased to 60 mg every 6 hours for pause of 2 seconds. Continue with Lopressor 50 mg twice daily. Aspirin changed to Eliquis by neurology today.   Anemia of chronic disease Continue with iron supplementation.   Type 2 diabetes mellitus A1c is 8.7. Continue with sliding scale and Lantus  CBG (last 3)  Recent Labs    08/20/20 0340 08/20/20 0826 08/20/20 1222  GLUCAP 135* 157* 143*   No changes in medications at this time.   . Hyperlipidemia Continue with Crestor.           DVT prophylaxis: (Lovenox) Code Status: (full code.  Family Communication: Husband  at bedside  Disposition:   Status is: Inpatient  Remains inpatient appropriate because:Unsafe d/c plan and Inpatient level of care appropriate due to severity of illness   Dispo: The patient is from: Home              Anticipated d/c is to: SNF              Anticipated d/c date is: 3 days              Patient currently is medically stable to d/c.       Consultants:   Neurology  Surgery.   Procedures:   Antimicrobials:  Anti-infectives (From admission, onward)   Start     Dose/Rate Route Frequency Ordered Stop   08/20/20 1400  vancomycin (VANCOCIN) 50 mg/mL oral solution 125 mg        125 mg Per Tube Every 6 hours 08/20/20 1145 08/28/20 0159   08/15/20 0500  vancomycin (VANCOCIN) IVPB 1000 mg/200 mL premix  Status:  Discontinued        1,000 mg 200 mL/hr over 60 Minutes Intravenous Every 36 hours 08/13/20 1657 08/16/20 1255   08/14/20 2000  vancomycin (VANCOCIN) 50 mg/mL oral solution 500 mg  Status:  Discontinued        500 mg Per Tube Every 6 hours 08/14/20 1820 08/20/20 1145   08/14/20 0730  vancomycin (VANCOCIN) 50 mg/mL oral solution 500 mg  Status:  Discontinued        500 mg Oral Every 6 hours 08/14/20 0649 08/14/20 1820   08/14/20 0730  metroNIDAZOLE (FLAGYL) IVPB 500 mg        500 mg 100 mL/hr over 60 Minutes Intravenous Every 8 hours 08/14/20 0649 08/28/20 0729   08/13/20 1700  vancomycin (VANCOREADY) IVPB 1250 mg/250 mL        1,250 mg 166.7 mL/hr over 90 Minutes Intravenous  Once 08/13/20 1656 08/13/20 1958   08/13/20 1630  ceFEPIme (MAXIPIME) 2 g in sodium chloride 0.9 % 100 mL IVPB        2 g 200 mL/hr over 30 Minutes Intravenous Every 12 hours 08/13/20 1622 08/19/20 1904   08/13/20 1600  ampicillin-sulbactam (UNASYN) 1.5 g in sodium chloride 0.9 % 100 mL IVPB  Status:  Discontinued        1.5 g 200 mL/hr over 30 Minutes Intravenous Every 8 hours 08/13/20 1447 08/13/20 1558   08/06/20 1630  cefTRIAXone (ROCEPHIN) 2 g in sodium chloride 0.9 % 100 mL IVPB         2 g 200 mL/hr over 30 Minutes Intravenous Every 24 hours 08/06/20 1325 08/09/20 1701   08/03/20 1630  cefTRIAXone (ROCEPHIN) 2 g in sodium chloride 0.9 % 100 mL IVPB  Status:  Discontinued        2 g 200 mL/hr over 30 Minutes Intravenous Every 24 hours 08/03/20 1547 08/06/20 1325   08/01/20 1200  piperacillin-tazobactam (ZOSYN) IVPB 3.375 g  Status:  Discontinued        3.375 g 12.5 mL/hr over 240 Minutes Intravenous Every 8 hours 08/01/20 1134 08/03/20 1509   07/26/20 1336  ceFAZolin (ANCEF) 2-4 GM/100ML-% IVPB  Note to Pharmacy: Teofilo PodJohnson, Ericka   : cabinet override      07/26/20 1336 07/27/20 0144         Subjective: No new complaints appears comfortable, voiding trial today. Objective: Vitals:   08/19/20 2327 08/20/20 0342 08/20/20 0830 08/20/20 1224  BP: 135/84 117/80 (!) 143/53 132/67  Pulse: (!) 105 88 90 84  Resp: 17 16 18 20   Temp: 98.4 F (36.9 C) 98.5 F (36.9 C) 98.3 F (36.8 C) 98.2 F (36.8 C)  TempSrc: Oral Oral Oral Axillary  SpO2: 100% 100% 100% 100%  Weight:  70.3 kg    Height:        Intake/Output Summary (Last 24 hours) at 08/20/2020 1554 Last data filed at 08/20/2020 1500 Gross per 24 hour  Intake 453.99 ml  Output 3400 ml  Net -2946.01 ml   Filed Weights   08/12/20 0500 08/16/20 0331 08/20/20 0342  Weight: 68.2 kg 72.6 kg 70.3 kg    Examination:  General exam: Opens eyes to verbal cues, does not appear to be in distress. Respiratory system: Clear to auscultation bilaterally, no wheezing or rhonchi. Cardiovascular system: S1-S2 heard, regular rate and rhythm, no JVD, no pedal edema Gastrointestinal system: Abdomen is soft, nontender, nondistended, bowel sounds normal, G-tube in place. Central nervous system: Alert opens eyes to verbal cues, not in any distress. Extremities: No pedal edema. Skin: No rashes seen Psychiatry: cannot be assessed.     Data Reviewed: I have personally reviewed following labs and imaging  studies  CBC: Recent Labs  Lab 08/13/20 1634 08/13/20 2200 08/16/20 0203 08/17/20 0844 08/18/20 0258 08/19/20 0141 08/20/20 0425  WBC 49.9*   < > 12.8* 10.1 10.9* 15.2* 10.5  NEUTROABS 44.4*  --   --   --  7.9* 11.9* 7.3  HGB 6.8*   < > 7.3* 7.5* 7.9* 8.2* 8.5*  HCT 23.5*   < > 24.6* 25.7* 26.6* 27.3* 28.5*  MCV 73.9*   < > 75.9* 76.5* 76.7* 77.3* 76.8*  PLT 399   < > 306 247 268 309 263   < > = values in this interval not displayed.    Basic Metabolic Panel: Recent Labs  Lab 08/14/20 1321 08/15/20 0302 08/16/20 0203 08/17/20 0220  NA 131* 131* 133* 136  K 4.4 4.5 4.3 4.3  CL 101 102 104 107  CO2 19* 21* 22 21*  GLUCOSE 174* 146* 140* 89  BUN 31* 31* 31* 31*  CREATININE 1.31* 1.28* 1.30* 1.31*  CALCIUM 8.2* 8.1* 8.3* 8.4*    GFR: Estimated Creatinine Clearance: 35.7 mL/min (A) (by C-G formula based on SCr of 1.31 mg/dL (H)).  Liver Function Tests: Recent Labs  Lab 08/15/20 0302  AST 28  ALT 43  ALKPHOS 159*  BILITOT 0.3  PROT 5.5*  ALBUMIN 1.7*    CBG: Recent Labs  Lab 08/19/20 1959 08/19/20 2332 08/20/20 0340 08/20/20 0826 08/20/20 1222  GLUCAP 176* 174* 135* 157* 143*     Recent Results (from the past 240 hour(s))  Urine Culture     Status: None   Collection Time: 08/13/20 10:43 AM   Specimen: Urine, Random  Result Value Ref Range Status   Specimen Description URINE, RANDOM  Final   Special Requests NONE  Final   Culture   Final    NO GROWTH Performed at [facility], [address] [place], [place] [address]    Report Status 08/14/2020 FINAL  Final  Culture, blood (routine x 2)  Status: None   Collection Time: 08/13/20  1:52 PM   Specimen: BLOOD RIGHT HAND  Result Value Ref Range Status   Specimen Description BLOOD RIGHT HAND  Final   Special Requests   Final    BOTTLES DRAWN AEROBIC ONLY Blood Culture adequate volume   Culture   Final    NO GROWTH 5 DAYS Performed at [facility], [address],  [place], [place] [address]    Report Status 08/18/2020 FINAL  Final  Culture, blood (routine x 2)     Status: None   Collection Time: 08/13/20  1:58 PM   Specimen: BLOOD RIGHT HAND  Result Value Ref Range Status   Specimen Description BLOOD RIGHT HAND  Final   Special Requests   Final    BOTTLES DRAWN AEROBIC ONLY Blood Culture adequate volume   Culture   Final    NO GROWTH 5 DAYS Performed at [facility], [address], [place], [place] [address]    Report Status 08/18/2020 FINAL  Final  C Difficile Quick Screen (NO PCR Reflex)     Status: Abnormal   Collection Time: 08/14/20  1:48 AM   Specimen: STOOL  Result Value Ref Range Status   C Diff antigen POSITIVE (A) NEGATIVE Final   C Diff toxin POSITIVE (A) NEGATIVE Final   C Diff interpretation Toxin producing C. difficile detected.  Final    Comment: CRITICAL RESULT CALLED TO, READ BACK BY AND VERIFIED WITH: Lutricia Horsfall RN 08/14/20 0501 JDW Performed at [facility], [address] [place], [place] [address]          Radiology Studies: No results found.      Scheduled Meds: .  stroke: mapping our early stages of recovery book   Does not apply Once  . apixaban  5 mg Per Tube BID  . bethanechol  10 mg Per Tube TID  . chlorhexidine  15 mL Mouth Rinse BID  . Chlorhexidine Gluconate Cloth  6 each Topical Daily  . diltiazem  60 mg Per Tube Q6H  . feeding supplement (PROSource TF)  45 mL Per Tube Daily  . ferrous sulfate  300 mg Per Tube TID  . free water  100 mL Per Tube Q8H  . insulin aspart  0-9 Units Subcutaneous Q4H  . insulin glargine  22 Units Subcutaneous Daily  . mouth rinse  15 mL Mouth Rinse q12n4p  . metoprolol tartrate  50 mg Per Tube BID  . rosuvastatin  20 mg Per Tube Daily  . vancomycin  125 mg Per Tube Q6H   Continuous Infusions: . sodium chloride 250 mL (08/19/20 2320)  . feeding supplement (JEVITY 1.2 CAL) 1,000 mL (08/19/20 0456)  . metronidazole 500 mg (08/20/20 0859)     LOS: 25 days         Kathlen Mody, MD [facility]   To contact the attending provider between 7A-7P or the covering provider during after hours 7P-7A, please log into the web site [email or web address] and access using universal [place] password for that web site. If you do not have the password, please call the hospital operator.  08/20/2020, 3:54 PM

## 2020-08-20 NOTE — Progress Notes (Signed)
Physical Therapy Treatment Patient Details Name: Samantha Paul MRN: [id number] DOB: July 16, 1946 Today's Date: 08/20/2020    History of Present Illness 74 y.o. female with history of atrial fibrillation, HTN, DM and stroke, who presents with acute onset of right gaze deviation, left-sided hemiplegia and ?dense receptive and expressive aphasia. 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 Pt received tPA and underwent R ICA and MCA thrombectomy on 8/1. Follow-up MRI demonstrating moderate R MCA as well as R ACA and PCA infarcts with trace petechial hemorrhage. Plan for PEG 8/24    PT Comments    Pt asleep on arrival, but awoke to her name.  Used warm up LE exercise to elicit reactions and to help her arouse.  Emphasis on transition to sitting and long session of sitting EOB working to activate truncal musculature with "startle" activities, propping on elbows forward and to the sides.  Head into cervical extension and combing tangles and generally supported upright sitting trials.  Rolling several times in supine to position padding and get pt in proper alignment.    Follow Up Recommendations  SNF     Equipment Recommendations  Other (comment)    Recommendations for Other Services       Precautions / Restrictions Precautions Precautions: Fall    Mobility  Bed Mobility   Bed Mobility: Supine to Sit;Sit to Supine;Rolling Rolling: Max assist   Supine to sit: Total assist;+2 for safety/equipment Sit to supine: Total assist;+2 for physical assistance   General bed mobility comments: Initially no attempts to assist due to lethargy, but she was aware we were laying down and reach out R UE to block going too fast.  Transfers Overall transfer level: Needs assistance               General transfer comment: NT today  Ambulation/Gait                 Stairs             Wheelchair Mobility    Modified  Rankin (Stroke Patients Only) Modified Rankin (Stroke Patients Only) Pre-Morbid Rankin Score: No symptoms Modified Rankin: Severe disability     Balance Overall balance assessment: Needs assistance Sitting-balance support: Feet supported;Single extremity supported;Bilateral upper extremity supported Sitting balance-Leahy Scale: Poor Sitting balance - Comments: worked at EOB>15 min to ellicit truncal reaction to startle activities, positional changes outside BOS, propped up on bil elbows.  Worked on cervical/spinal extension mostly with tactile cues though pt's husband participated in cuing.  Spent time just trying to untangle her hair.  Pt did not scream or winch at pulling her hair as I would have expected. Postural control: Posterior lean;Right lateral lean                                  Cognition Arousal/Alertness: Lethargic Behavior During Therapy: Flat affect Overall Cognitive Status: Difficult to assess                     Current Attention Level: Focused   Following Commands: Follows one step commands inconsistently;Follows one step commands with increased time              Exercises Other Exercises Other Exercises: bil LE hip/knee flex/ext P/AAROM prior to mobility (as warm up)    General Comments General comments (skin integrity, edema, etc.): vss overall  Pertinent Vitals/Pain Pain Assessment: No/denies pain Faces Pain Scale: No hurt Pain Intervention(s): Other (comment) (not even much response to pulling/detangling her hair)    Home Living                      Prior Function            PT Goals (current goals can now be found in the care plan section) Acute Rehab PT Goals PT Goal Formulation: Patient unable to participate in goal setting Time For Goal Achievement: 08/25/20 Potential to Achieve Goals: Fair Progress towards PT goals: Progressing toward goals (expected slow progress)    Frequency    Min  2X/week      PT Plan Current plan remains appropriate    Co-evaluation              AM-PAC PT "6 Clicks" Mobility   Outcome Measure  Help needed turning from your back to your side while in a flat bed without using bedrails?: Total Help needed moving from lying on your back to sitting on the side of a flat bed without using bedrails?: Total Help needed moving to and from a bed to a chair (including a wheelchair)?: Total Help needed standing up from a chair using your arms (e.g., wheelchair or bedside chair)?: Total Help needed to walk in hospital room?: Total Help needed climbing 3-5 steps with a railing? : Total 6 Click Score: 6    End of Session   Activity Tolerance: Patient limited by fatigue Patient left: in bed;with call bell/phone within reach;with family/visitor present Nurse Communication: Mobility status PT Visit Diagnosis: Hemiplegia and hemiparesis;Difficulty in walking, not elsewhere classified (R26.2) Hemiplegia - Right/Left: Left Hemiplegia - dominant/non-dominant: Non-dominant Hemiplegia - caused by: Cerebral infarction     Time: 6295-2841 PT Time Calculation (min) (ACUTE ONLY): 32 min  Charges:  $Therapeutic Activity: 23-37 mins                     08/20/2020  Samantha Paul., PT Acute Rehabilitation Services [phone]  (pager) [phone]  (office)   Samantha Paul 08/20/2020, 4:32 PM

## 2020-08-20 NOTE — Progress Notes (Signed)
  Speech Language Pathology Treatment: Dysphagia  Patient Details Name: Samantha Paul MRN: [id number] DOB: [birth year] Today's Date: 08/20/2020 Time: 5809-9833 SLP Time Calculation (min) (ACUTE ONLY): 9 min  Assessment / Plan / Recommendation Clinical Impression  Pt was seen for dysphagia treatment with her husband present. Her husband reported that she is more alert today but has not been communicating or allowing him to do things such as oral suction. Pt was more alert at baseline but did not accept any boluses presented during this session. She pursed her lips when boluses were presented and was resistant to oral care or cueing to participate. SLP will continue to follow pt.    HPI HPI: Patient is a 74 y.o. female with PMH: atrial fibrillation, HTN, DM, CVA in 2019, who presented to hospital with right gaze deviation, left sided hemiplegia and dense receptive and expressive aphasiaCTA head and neck showed embolic occulsion of right carotid terminus, minimal flow was seen in right MCA territory. MRI revealed multifocal ischemic infarction within the right MCA territory, greatest at the frontal operculum and insula; large area of infarction in right basal gangliaa with petechial hemorrhage versus contrast staining, punctate focus of acute ischemia in left occipital lobe. PEG placed 8/24      SLP Plan  Continue with current plan of care       Recommendations  Diet recommendations: NPO Medication Administration: Via alternative means                Oral Care Recommendations: Oral care QID Follow up Recommendations: 24 hour supervision/assistance;Inpatient Rehab;Skilled Nursing facility SLP Visit Diagnosis: Dysphagia, unspecified (R13.10) Plan: Continue with current plan of care       Sande Pickert I. Vear Clock, MS, CCC-SLP Acute Rehabilitation Services Office number [phone] Pager [phone]                Scheryl Marten 08/20/2020, 5:44 PM

## 2020-08-20 NOTE — TOC Progression Note (Signed)
Transition of Care [facility]) - Progression Note    Patient Details  Name: Samantha Paul MRN: [id number] Date of Birth: [birth year]  Transition of [facility]) CM/SW Contact  Kermit Balo, RN Phone Number: 08/20/2020, 4:24 PM  Clinical Narrative:    Cm faxed in information to [facility] health for SNF authorization. Covid to be done in am. Plan is for [facility] on Sunday. Pts spouse updated.     Expected Discharge Plan: Skilled Nursing Facility Barriers to Discharge: Continued Medical Work up  Expected Discharge Plan and Services Expected Discharge Plan: Skilled Nursing Facility In-house Referral: Clinical Social Work Discharge Planning Services: CM Consult Post Acute Care Choice: Skilled Nursing Facility Living arrangements for the past 2 months: Single Family Home                                       Social Determinants of Health (SDOH) Interventions    Readmission Risk Interventions Readmission Risk Prevention Plan 07/03/2020 07/03/2020  Post Dischage Appt Complete -  Medication Screening Complete Complete  Transportation Screening Complete Complete

## 2020-08-21 LAB — GLUCOSE, CAPILLARY
Glucose-Capillary: 112 mg/dL — ABNORMAL HIGH (ref 70–99)
Glucose-Capillary: 131 mg/dL — ABNORMAL HIGH (ref 70–99)
Glucose-Capillary: 151 mg/dL — ABNORMAL HIGH (ref 70–99)
Glucose-Capillary: 157 mg/dL — ABNORMAL HIGH (ref 70–99)
Glucose-Capillary: 174 mg/dL — ABNORMAL HIGH (ref 70–99)
Glucose-Capillary: 186 mg/dL — ABNORMAL HIGH (ref 70–99)

## 2020-08-21 LAB — URINALYSIS, ROUTINE W REFLEX MICROSCOPIC
Bilirubin Urine: NEGATIVE
Glucose, UA: NEGATIVE mg/dL
Ketones, ur: NEGATIVE mg/dL
Leukocytes,Ua: NEGATIVE
Nitrite: NEGATIVE
Protein, ur: NEGATIVE mg/dL
Specific Gravity, Urine: 1.008 (ref 1.005–1.030)
pH: 6 (ref 5.0–8.0)

## 2020-08-21 LAB — SARS CORONAVIRUS 2 (TAT 6-24 HRS): SARS Coronavirus 2: NEGATIVE

## 2020-08-21 MED ORDER — METRONIDAZOLE 50 MG/ML ORAL SUSPENSION
500.0000 mg | Freq: Three times a day (TID) | ORAL | Status: DC
  Administered 2020-08-21 – 2020-08-23 (×7): 500 mg
  Filled 2020-08-21 (×11): qty 10

## 2020-08-21 NOTE — Progress Notes (Addendum)
Rx will follow apixaban peripherally. Stable on 5mg  BID. Change Flagyl to per tube for c.diff.  , PharmD, BCIDP, AAHIVP, CPP Infectious Disease Pharmacist 08/21/2020 8:38 AM

## 2020-08-21 NOTE — Plan of Care (Signed)
  Problem: Education: Goal: Knowledge of disease or condition will improve Outcome: Progressing Goal: Knowledge of secondary prevention will improve Outcome: Progressing Goal: Knowledge of patient specific risk factors addressed and post discharge goals established will improve Outcome: Progressing Goal: Individualized Educational Video(s) Outcome: Progressing   Problem: Coping: Goal: Will verbalize positive feelings about self Outcome: Progressing Goal: Will identify appropriate support needs Outcome: Progressing   Problem: Health Behavior/Discharge Planning: Goal: Ability to manage health-related needs will improve Outcome: Progressing   Problem: Self-Care: Goal: Ability to participate in self-care as condition permits will improve Outcome: Progressing Goal: Verbalization of feelings and concerns over difficulty with self-care will improve Outcome: Progressing Goal: Ability to communicate needs accurately will improve Outcome: Progressing   Problem: Nutrition: Goal: Risk of aspiration will decrease Outcome: Progressing Goal: Dietary intake will improve Outcome: Progressing   Problem: Ischemic Stroke/TIA Tissue Perfusion: Goal: Complications of ischemic stroke/TIA will be minimized Outcome: Progressing   Problem: Education: Goal: Knowledge of General Education information will improve Description: Including pain rating scale, medication(s)/side effects and non-pharmacologic comfort measures Outcome: Progressing   Problem: Health Behavior/Discharge Planning: Goal: Ability to manage health-related needs will improve Outcome: Progressing   Problem: Clinical Measurements: Goal: Ability to maintain clinical measurements within normal limits will improve Outcome: Progressing Goal: Will remain free from infection Outcome: Progressing Goal: Diagnostic test results will improve Outcome: Progressing Goal: Respiratory complications will improve Outcome: Progressing Goal:  Cardiovascular complication will be avoided Outcome: Progressing

## 2020-08-21 NOTE — Progress Notes (Signed)
PROGRESS NOTE    Samantha Paul  AST:[id number] DOB: Feb 01, 1946 DOA: 07/26/2020 PCP: Rometta Emery, MD    No chief complaint on file.   Brief Narrative:   Samantha Paul is a 74 y.o. year old female with medical history significant for atrial fibrillation with poor adherence to Eliquis, diabetes, HTN, prior CVA who presented on 07/26/2020 with acute onset right gaze deviation, left-sided hemiplegia receptive/expressive aphasia after unwitnessed fall heard by her husband who found her in the kitchen with the above findings.  Code stroke was called and CT head showed no acute findings, but CTA head/neck showed embolic occlusion of the right carotid with minimal flow seen in the right MCA territory with large penumbra remainder of the right MCA territory.  Patient was admitted to neuro ICU and underwent TPA thrombectomy patient was intubated for revascularization procedure and required prolonged mechanical ventilation due to poor mentation before successful extubation on 8/14.  Hospital course was also complicated by Klebsiella pneumonia (respiratory culture on 8/8) requiring course of Zosyn followed by ceftriaxone for total of 9 days (8/7-8/15)  TRH was consulted on 8/19 in the setting of code sepsis as patient was febrile with T-max of one 2.5, tachycardic, tachypneic, WBC of 49.9.  She was empirically started on vancomycin and cefepime for HCAP.  C. difficile was sent given diarrhea and was found to be positive so patient was started on oral vancomycin and IV Flagyl.  In work-up CT abdomen also showed concern for potential pyelonephritis, but UA was essentially unremarkable. Pt seen and examined this am, pt had urinary retension and had in and out cath earlier this am.  She hasn't voided after 10 hours, will do bladder scan and re insert the foley if she is not able to void on her own.  She has completed the treatment for pyelonephritis and will complete the course of vancomycin and 10 d of flagyl.       Assessment & Plan:   Principal Problem:   Sepsis (HCC) Active Problems:   Weakness generalized   Hyperlipidemia   Essential hypertension   Diabetes mellitus type 2 in nonobese (HCC)   Acute blood loss anemia   Stroke [facility])   Stroke due to embolism (HCC)   Middle cerebral artery embolism, right   Acute respiratory failure with hypoxia (HCC)   DNR (do not resuscitate) discussion   Palliative care by specialist   C. difficile colitis   Acute pyelonephritis   Dysphagia due to recent cerebrovascular accident   Sepsis secondary to C. difficile colitis Afebrile, blood cultures remain unremarkable, Decrease oral vancomycin to 125 mg every 6 hours and IV Flagyl for another 3 days to complete a course of 10 days. Patient continues to remain afebrile and sepsis physiology has resolved. Opens eyes briefly , not following commands. Still has 2lit of [place] oxygen, suspect its for comfort.     Possible pyelonephritis. CT of the abdomen on 8/20 showed perinephric fat stranding concerning for acute pyelonephritis unfortunately no culture data are unable to assess urinary symptoms giving mentation after CVA.  Patient completed 7 days of IV cefepime, given hospitalization and risk for MDR. Repeat UA is negative for infection.    Acute right MCA stroke, right ICA total occlusion, s/p TPA and revascularization by IR on 07/26/2020 Patient has dense left hemiplegia and unable to handle oral secretions or support herself, continues to have poor ability to clear oral secretions, high risk for aspiration pneumonia.  Patient has a PEG tube and palliative care  consulted to help with goals of care discussion. PT OT evaluation recommending SNF and she has a bed that will open up on Sunday.    Atrial fibrillation Currently rate controlled Continue with Eliquis and Cardizem has been decreased to 60 mg every 6 hours for pause of 2 seconds. Continue with Lopressor 50 mg twice daily. No pauses seen  today.  Aspirin changed to Eliquis by neurology today.   Anemia of chronic disease/ guiac positive stools.  Continue with iron supplementation. Last hemoglobin from 8/26 is around 8.5.    Type 2 diabetes mellitus A1c is 8.7. Continue with sliding scale and Lantus CBG (last 3)  Recent Labs    08/21/20 0354 08/21/20 0814 08/21/20 1248  GLUCAP 112* 157* 186*   No changes in medications at this time.continue with lantus and SSI.    . Hyperlipidemia Continue with Crestor.   Hyponatremia;  Resolved.   Essential hypertension:  Well controlled BP parameters.    Urinary retention:  Unclear etiology, failed voiding trial yesterday had in and out cath earlier this am and repeat bladder scan this afternoon showed about 500 ml urine in the bladder.  Re insert the foley catheter and recommend outpatient followup with UROLOGY for voiding trials.  Repeat UA does not show any infection.    Dysphagia secondary to stroke.  S/p PEG placement and tube feeds running.  SLP following the patient.     In view of her extensive co morbidities, previous strokes, cognitive deficits, poor progression, clinical deterioration, low functional status, palliative care consulted, on further discussions,  family open to all offered and available medical interventions to prolong life.  They are hopeful for improvement    DVT prophylaxis: (Lovenox) Code Status: (full code.  Family Communication: Husband at bedside  Disposition:   Status is: Inpatient  Remains inpatient appropriate because:Unsafe d/c plan and Inpatient level of care appropriate due to severity of illness   Dispo: The patient is from: Home              Anticipated d/c is to: SNF              Anticipated d/c date is: 3 days              Patient currently is medically stable to d/c.       Consultants:   Neurology  Surgery.   Procedures: 8/4 Cortrak 8/1 revascularization of right ACA by IR 8/24 PEG tube  placement  Antimicrobials:  Anti-infectives (From admission, onward)   Start     Dose/Rate Route Frequency Ordered Stop   08/21/20 1000  metroNIDAZOLE (FLAGYL) 50 mg/ml oral suspension 500 mg        500 mg Per Tube 3 times daily 08/21/20 0840 08/28/20 0959   08/20/20 1400  vancomycin (VANCOCIN) 50 mg/mL oral solution 125 mg        125 mg Per Tube Every 6 hours 08/20/20 1145 08/28/20 0159   08/15/20 0500  vancomycin (VANCOCIN) IVPB 1000 mg/200 mL premix  Status:  Discontinued        1,000 mg 200 mL/hr over 60 Minutes Intravenous Every 36 hours 08/13/20 1657 08/16/20 1255   08/14/20 2000  vancomycin (VANCOCIN) 50 mg/mL oral solution 500 mg  Status:  Discontinued        50 0 mg Per Tube Every 6 hours 08/14/20 1820 08/20/20 1145   08/14/20 0730  vancomycin (VANCOCIN) 50 mg/mL oral solution 500 mg  Status:  Discontinued  500 mg Oral Every 6 hours 08/14/20 0649 08/14/20 1820   08/14/20 0730  metroNIDAZOLE (FLAGYL) IVPB 500 mg  Status:  Discontinued        500 mg 100 mL/hr over 60 Minutes Intravenous Every 8 hours 08/14/20 0649 08/21/20 0840   08/13/20 1700  vancomycin (VANCOREADY) IVPB 1250 mg/250 mL        1,250 mg 166.7 mL/hr over 90 Minutes Intravenous  Once 08/13/20 1656 08/13/20 1958   08/13/20 1630  ceFEPIme (MAXIPIME) 2 g in sodium chloride 0.9 % 100 mL IVPB        2 g 200 mL/hr over 30 Minutes Intravenous Every 12 hours 08/13/20 1622 08/19/20 1904   08/13/20 1600  ampicillin-sulbactam (UNASYN) 1.5 g in sodium chloride 0.9 % 100 mL IVPB  Status:  Discontinued        1.5 g 200 mL/hr over 30 Minutes Intravenous Every 8 hours 08/13/20 1447 08/13/20 1558   08/06/20 1630  cefTRIAXone (ROCEPHIN) 2 g in sodium chloride 0.9 % 100 mL IVPB        2 g 200 mL/hr over 30 Minutes Intravenous Every 24 hours 08/06/20 1325 08/09/20 1701   08/03/20 1630  cefTRIAXone (ROCEPHIN) 2 g in sodium chloride 0.9 % 100 mL IVPB  Status:  Discontinued        2 g 200 mL/hr over 30 Minutes Intravenous Every  24 hours 08/03/20 1547 08/06/20 1325   08/01/20 1200  piperacillin-tazobactam (ZOSYN) IVPB 3.375 g  Status:  Discontinued        3.375 g 12.5 mL/hr over 240 Minutes Intravenous Every 8 hours 08/01/20 1134 08/03/20 1509   07/26/20 1336  ceFAZolin (ANCEF) 2-4 GM/100ML-% IVPB       Note to Pharmacy: [facility]   : cabinet override      07/26/20 1336 07/27/20 0144         Subjective: No new complaints today laying in bed and appears comfortable.  Objective: Vitals:   08/21/20 0354 08/21/20 0832 08/21/20 1055 08/21/20 1250  BP: 133/69 128/79 (!) 149/101 117/87  Pulse: 89 87 84 71  Resp: 18 18  16   Temp: 98 F (36.7 C) 98.9 F (37.2 C)  98.6 F (37 C)  TempSrc: Axillary Oral  Axillary  SpO2: 100% 100%  100%  Weight: 68.4 kg     Height:        Intake/Output Summary (Last 24 hours) at 08/21/2020 1607 Last data filed at 08/21/2020 08/23/2020 Gross per 24 hour  Intake 635.54 ml  Output 1550 ml  Net -914.46 ml   Filed Weights   08/16/20 0331 08/20/20 0342 08/21/20 0354  Weight: 72.6 kg 70.3 kg 68.4 kg    Examination:  General exam: Opens eyes to verbal cues, not in any kind of distress. Respiratory system: Diminished air entry at bases,no wheezing or rhonchi.  Cardiovascular system: S1-S2 heard, irregular, no JVD Gastrointestinal system: Abdomen is soft, nontender, nondistended, bowel sounds normal, G-tube in place. Central nervous system: Patient is alert has left hemiplegia Extremities: No pedal edema. Skin: No rashes seen Psychiatry: cannot be assessed.     Data Reviewed: I have personally reviewed following labs and imaging studies  CBC: Recent Labs  Lab 08/16/20 0203 08/17/20 0844 08/18/20 0258 08/19/20 0141 08/20/20 0425  WBC 12.8* 10.1 10.9* 15.2* 10.5  NEUTROABS  --   --  7.9* 11.9* 7.3  HGB 7.3* 7.5* 7.9* 8.2* 8.5*  HCT 24.6* 25.7* 26.6* 27.3* 28.5*  MCV 75.9* 76.5* 76.7* 77.3* 76.8*  PLT  306 247 268 309 263    Basic Metabolic Panel: Recent Labs   Lab 08/15/20 0302 08/16/20 0203 08/17/20 0220  NA 131* 133* 136  K 4.5 4.3 4.3  CL 102 104 107  CO2 21* 22 21*  GLUCOSE 146* 140* 89  BUN 31* 31* 31*  CREATININE 1.28* 1.30* 1.31*  CALCIUM 8.1* 8.3* 8.4*    GFR: Estimated Creatinine Clearance: 35.2 mL/min (A) (by C-G formula based on SCr of 1.31 mg/dL (H)).  Liver Function Tests: Recent Labs  Lab 08/15/20 0302  AST 28  ALT 43  ALKPHOS 159*  BILITOT 0.3  PROT 5.5*  ALBUMIN 1.7*    CBG: Recent Labs  Lab 08/20/20 2007 08/21/20 0006 08/21/20 0354 08/21/20 0814 08/21/20 1248  GLUCAP 183* 174* 112* 157* 186*     Recent Results (from the past 240 hour(s))  Urine Culture     Status: None   Collection Time: 08/13/20 10:43 AM   Specimen: Urine, Random  Result Value Ref Range Status   Specimen Description URINE, RANDOM  Final   Special Requests NONE  Final   Culture   Final    NO GROWTH Performed at [facility], [address] [place], [place] [address]    Report Status 08/14/2020 FINAL  Final  Culture, blood (routine x 2)     Status: None   Collection Time: 08/13/20  1:52 PM   Specimen: BLOOD RIGHT HAND  Result Value Ref Range Status   Specimen Description BLOOD RIGHT HAND  Final   Special Requests   Final    BOTTLES DRAWN AEROBIC ONLY Blood Culture adequate volume   Culture   Final    NO GROWTH 5 DAYS Performed at [facility], [address] [place], [place] [address]    Report Status 08/18/2020 FINAL  Final  Culture, blood (routine x 2)     Status: None   Collection Time: 08/13/20  1:58 PM   Specimen: BLOOD RIGHT HAND  Result Value Ref Range Status   Specimen Description BLOOD RIGHT HAND  Final   Special Requests   Final    BOTTLES DRAWN AEROBIC ONLY Blood Culture adequate volume   Culture   Final    NO GROWTH 5 DAYS Performed at [facility], [address] [place], [place] [address]    Report Status 08/18/2020 FINAL  Final  C Difficile Quick Screen (NO PCR Reflex)      Status: Abnormal   Collection Time: 08/14/20  1:48 AM   Specimen: STOOL  Result Value Ref Range Status   C Diff antigen POSITIVE (A) NEGATIVE Final   C Diff toxin POSITIVE (A) NEGATIVE Final   C Diff interpretation Toxin producing C. difficile detected.  Final    Comment: CRITICAL RESULT CALLED TO, READ BACK BY AND VERIFIED WITH: Lutricia HorsfallJ STEELMAN RN 08/14/20 0501 JDW Performed at [facility], [address] [place], [place] [address]   SARS CORONAVIRUS 2 (TAT 6-24 HRS) Nasopharyngeal Nasopharyngeal Swab     Status: None   Collection Time: 08/21/20  5:00 AM   Specimen: Nasopharyngeal Swab  Result Value Ref Range Status   SARS Coronavirus 2 NEGATIVE NEGATIVE Final    Comment: (NOTE) SARS-CoV-2 target nucleic acids are NOT DETECTED.  The SARS-CoV-2 RNA is generally detectable in upper and lower respiratory specimens during the acute phase of infection. Negative results do not preclude SARS-CoV-2 infection, do not rule out co-infections with other pathogens, and should not be used  as the sole basis for treatment or other patient management decisions. Negative results must be combined with clinical observations, patient history, and epidemiological information. The expected result is Negative.  Fact Sheet for Patients: HairSlick.no  Fact Sheet for Healthcare Providers: quierodirigir.com  This test is not yet approved or cleared by the [place] FDA and  has been authorized for detection and/or diagnosis of SARS-CoV-2 by FDA under an Emergency Use Authorization (EUA). This EUA will remain  in effect (meaning this test can be used) for the duration of the COVID-19 declaration under Se ction 564(b)(1) of the Act, 21 U.S.C. section 360bbb-3(b)(1), unless the authorization is terminated or revoked sooner.  Performed at [facility], [address], [place], [place] [address]          Radiology Studies: No  results found.      Scheduled Meds: .  stroke: mapping our early stages of recovery book   Does not apply Once  . apixaban  5 mg Per Tube BID  . bethanechol  10 mg Per Tube TID  . chlorhexidine  15 mL Mouth Rinse BID  . Chlorhexidine Gluconate Cloth  6 each Topical Daily  . diltiazem  60 mg Per Tube Q6H  . feeding supplement (PROSource TF)  45 mL Per Tube Daily  . ferrous sulfate  300 mg Per Tube TID  . free water  100 mL Per Tube Q8H  . insulin aspart  0-9 Units Subcutaneous Q4H  . insulin glargine  22 Units Subcutaneous Daily  . mouth rinse  15 mL Mouth Rinse q12n4p  . metoprolol tartrate  50 mg Per Tube BID  . metroNIDAZOLE  500 mg Per Tube TID  . rosuvastatin  20 mg Per Tube Daily  . vancomycin  125 mg Per Tube Q6H   Continuous Infusions: . sodium chloride 10 mL/hr at 08/21/20 0429  . feeding supplement (JEVITY 1.2 CAL) 1,000 mL (08/21/20 0618)     LOS: 26 days        Kathlen Mody, MD [facility]   To contact the attending provider between 7A-7P or the covering provider during after hours 7P-7A, please log into the web site [email or web address] and access using universal [place] password for that web site. If you do not have the password, please call the hospital operator.  08/21/2020, 4:07 PM

## 2020-08-22 LAB — BASIC METABOLIC PANEL
Anion gap: 9 (ref 5–15)
BUN: 28 mg/dL — ABNORMAL HIGH (ref 8–23)
CO2: 21 mmol/L — ABNORMAL LOW (ref 22–32)
Calcium: 8.4 mg/dL — ABNORMAL LOW (ref 8.9–10.3)
Chloride: 103 mmol/L (ref 98–111)
Creatinine, Ser: 1.17 mg/dL — ABNORMAL HIGH (ref 0.44–1.00)
GFR calc Af Amer: 54 mL/min — ABNORMAL LOW (ref >60)
GFR calc non Af Amer: 47 mL/min — ABNORMAL LOW (ref >60)
Glucose, Bld: 145 mg/dL — ABNORMAL HIGH (ref 70–99)
Potassium: 4.7 mmol/L (ref 3.5–5.1)
Sodium: 133 mmol/L — ABNORMAL LOW (ref 135–145)

## 2020-08-22 LAB — GLUCOSE, CAPILLARY
Glucose-Capillary: 131 mg/dL — ABNORMAL HIGH (ref 70–99)
Glucose-Capillary: 135 mg/dL — ABNORMAL HIGH (ref 70–99)
Glucose-Capillary: 143 mg/dL — ABNORMAL HIGH (ref 70–99)
Glucose-Capillary: 149 mg/dL — ABNORMAL HIGH (ref 70–99)
Glucose-Capillary: 172 mg/dL — ABNORMAL HIGH (ref 70–99)
Glucose-Capillary: 173 mg/dL — ABNORMAL HIGH (ref 70–99)
Glucose-Capillary: 173 mg/dL — ABNORMAL HIGH (ref 70–99)

## 2020-08-22 NOTE — TOC Progression Note (Signed)
Transition of Care [facility]) - Progression Note    Patient Details  Name: Paisly Fingerhut MRN: [id number] Date of Birth: 1946-12-09  Transition of Care [facility]) CM/SW [place], [place] Phone Number: 08/22/2020, 11:44 AM  Clinical Narrative:    CSW met with patient and spouse bedside. Patient's spouse stated he has church Sunday at [place], but will be at the hospital afterward. Spouse requested patient discharge once he's made it to the hospital. CSW expressed understanding and will follow-up Sunday.    Expected Discharge Plan: [place] Barriers to Discharge: Continued Medical Work up  Expected Discharge Plan and Services Expected Discharge Plan: Deputy In-house Referral: Clinical Social Work Discharge Planning Services: CM Consult Post Acute Care Choice: [place] Living arrangements for the past 2 months: Single Family Home                                       Social Determinants of Health (SDOH) Interventions    Readmission Risk Interventions Readmission Risk Prevention Plan 07/03/2020 07/03/2020  Post Dischage Appt Complete -  Medication Screening Complete Complete  Transportation Screening Complete Complete

## 2020-08-22 NOTE — Progress Notes (Signed)
PROGRESS NOTE    Samantha Paul  XLK:[id number] DOB: 1946/01/18 DOA: 07/26/2020 PCP: Rometta Emery, MD    No chief complaint on file.   Brief Narrative:   Samantha Paul is a 74 y.o. year old female with medical history significant for atrial fibrillation with poor adherence to Eliquis, diabetes, HTN, prior CVA who presented on 07/26/2020 with acute onset right gaze deviation, left-sided hemiplegia receptive/expressive aphasia after unwitnessed fall heard by her husband who found her in the kitchen with the above findings.  Code stroke was called and CT head showed no acute findings, but CTA head/neck showed embolic occlusion of the right carotid with minimal flow seen in the right MCA territory with large penumbra remainder of the right MCA territory.  Patient was admitted to neuro ICU and underwent TPA thrombectomy patient was intubated for revascularization procedure and required prolonged mechanical ventilation due to poor mentation before successful extubation on 8/14.  Hospital course was also complicated by Klebsiella pneumonia (respiratory culture on 8/8) requiring course of Zosyn followed by ceftriaxone for total of 9 days (8/7-8/15)  TRH was consulted on 8/19 in the setting of code sepsis as patient was febrile with T-max of one 2.5, tachycardic, tachypneic, WBC of 49.9.  She was empirically started on vancomycin and cefepime for HCAP.  C. difficile was sent given diarrhea and was found to be positive so patient was started on oral vancomycin and IV Flagyl.  In work-up CT abdomen also showed concern for potential pyelonephritis, but UA was essentially unremarkable. Hospital course complicated by urinary retention and she failed voiding trial. Catheter was replaced and recommend outpatient follow up .  She has completed the treatment for pyelonephritis and will complete the course of vancomycin and 10 d of flagyl.   Pt seen and examined at bedside, no new complaints overnight. Discussed with  husband at bedside, regarding the plan for discharge tomorrow.      Assessment & Plan:   Principal Problem:   Sepsis (HCC) Active Problems:   Weakness generalized   Hyperlipidemia   Essential hypertension   Diabetes mellitus type 2 in nonobese (HCC)   Acute blood loss anemia   Stroke [facility])   Stroke due to embolism (HCC)   Middle cerebral artery embolism, right   Acute respiratory failure with hypoxia (HCC)   DNR (do not resuscitate) discussion   Palliative care by specialist   C. difficile colitis   Acute pyelonephritis   Dysphagia due to recent cerebrovascular accident   Sepsis secondary to C. difficile colitis Afebrile, blood cultures remain unremarkable, Decrease oral vancomycin to 125 mg every 6 hours and oral flagyl to complete the course.  Patient continues to remain afebrile and sepsis physiology has resolved. Pt opens eyes briefly and does not follow commands. She does not appear to be in distress.  No fevers or chills. No worsening diarrhea.     Possible pyelonephritis. CT of the abdomen on 8/20 showed perinephric fat stranding concerning for acute pyelonephritis unfortunately no culture data are unable to assess urinary symptoms giving mentation after CVA.  Patient completed 7 days of IV cefepime, given hospitalization and risk for MDR. Repeat UA is negative for infection.    Acute right MCA stroke, right ICA total occlusion, s/p TPA and revascularization by IR on 07/26/2020 Patient has dense left hemiplegia and unable to handle oral secretions or support herself, continues to have poor ability to clear oral secretions, high risk for aspiration pneumonia.  Patient has a PEG tube and palliative care  consulted to help with goals of care discussion. PT OT evaluation recommending SNF and she has a bed that will open up on Sunday. Plan for discharge on sunday   Atrial fibrillation/ Acquired thrombocytopenia Currently rate controlled Continue with Eliquis and Cardizem  has been decreased to 60 mg every 6 hours for pause of 2 seconds.  Continue with Lopressor 50 mg twice daily.  Aspirin changed to Eliquis by neurology.  Continue with eliquis. , and lopressor for rate control.    Anemia of chronic disease/ guiac positive stools.  Continue with iron supplementation. Last hemoglobin from 8/26 is around 8.5.    Type 2 diabetes mellitus A1c is 8.7.  CBG (last 3)  Recent Labs    08/22/20 0915 08/22/20 1251 08/22/20 1600  GLUCAP 143* 149* 172*  continue with lantus 22 units and SSI.    . Hyperlipidemia Continue with Crestor.   Hyponatremia;  Resolved.   Essential hypertension:  Well controlled.    Urinary retention:  Unclear etiology, failed voiding trial, required inand out cath twice and foley had to be re inserted.  Re insert the foley catheter and recommend outpatient followup with UROLOGY for voiding trials.  Repeat UA does not show any infection.    Dysphagia secondary to stroke.  S/p PEG placement and tube feeds running.  SLP following the patient.     In view of her extensive co morbidities, previous strokes, cognitive deficits, poor progression, clinical deterioration, low functional status, palliative care consulted, on further discussions,  family open to all offered and available medical interventions to prolong life.  They are hopeful for improvement    DVT prophylaxis: (Lovenox) Code Status: (full code.  Family Communication: Husband at bedside  Disposition:   Status is: Inpatient  Remains inpatient appropriate because:Unsafe d/c plan and Inpatient level of care appropriate due to severity of illness   Dispo: The patient is from: Home              Anticipated d/c is to: SNF              Anticipated d/c date is: 3 days              Patient currently is medically stable to d/c.       Consultants:   Neurology  Surgery.   Procedures: 8/4 Cortrak 8/1 revascularization of right ACA by IR 8/24 PEG tube  placement  Antimicrobials:  Anti-infectives (From admission, onward)   Start     Dose/Rate Route Frequency Ordered Stop   08/21/20 1000  metroNIDAZOLE (FLAGYL) 50 mg/ml oral suspension 500 mg        500 mg Per Tube 3 times daily 08/21/20 0840 08/28/20 0959   08/20/20 1400  vancomycin (VANCOCIN) 50 mg/mL oral solution 125 mg        125 mg Per Tube Every 6 hours 08/20/20 1145 08/28/20 0159   08/15/20 0500  vancomycin (VANCOCIN) IVPB 1000 mg/200 mL premix  Status:  Discontinued        1,000 mg 200 mL/hr over 60 Minutes Intravenous Every 36 hours 08/13/20 1657 08/16/20 1255   08/14/20 2000  vancomycin (VANCOCIN) 50 mg/mL oral solution 500 mg  Status:  Discontinued        500 mg Per Tube Every 6 hours 08/14/20 1820 08/20/20 1145   08/14/20 0730  vancomycin (VANCOCIN) 50 mg/mL oral solution 500 mg  Status:  Discontinued        50 0 mg Oral Every 6 hours 08/14/20 0649 08/14/20 1820  08/14/20 0730  metroNIDAZOLE (FLAGYL) IVPB 500 mg  Status:  Discontinued        500 mg 100 mL/hr over 60 Minutes Intravenous Every 8 hours 08/14/20 0649 08/21/20 0840   08/13/20 1700  vancomycin (VANCOREADY) IVPB 1250 mg/250 mL        1,250 mg 166.7 mL/hr over 90 Minutes Intravenous  Once 08/13/20 1656 08/13/20 1958   08/13/20 1630  ceFEPIme (MAXIPIME) 2 g in sodium chloride 0.9 % 100 mL IVPB        2 g 200 mL/hr over 30 Minutes Intravenous Every 12 hours 08/13/20 1622 08/19/20 1904   08/13/20 1600  ampicillin-sulbactam (UNASYN) 1.5 g in sodium chloride 0.9 % 100 mL IVPB  Status:  Discontinued        1.5 g 200 mL/hr over 30 Minutes Intravenous Every 8 hours 08/13/20 1447 08/13/20 1558   08/06/20 1630  cefTRIAXone (ROCEPHIN) 2 g in sodium chloride 0.9 % 100 mL IVPB        2 g 200 mL/hr over 30 Minutes Intravenous Every 24 hours 08/06/20 1325 08/09/20 1701   08/03/20 1630  cefTRIAXone (ROCEPHIN) 2 g in sodium chloride 0.9 % 100 mL IVPB  Status:  Discontinued        2 g 200 mL/hr over 30 Minutes Intravenous Every  24 hours 08/03/20 1547 08/06/20 1325   08/01/20 1200  piperacillin-tazobactam (ZOSYN) IVPB 3.375 g  Status:  Discontinued        3.375 g 12.5 mL/hr over 240 Minutes Intravenous Every 8 hours 08/01/20 1134 08/03/20 1509   07/26/20 1336  ceFAZolin (ANCEF) 2-4 GM/100ML-% IVPB       Note to Pharmacy: [facility]   : cabinet override      07/26/20 1336 07/27/20 0144         Subjective: No overnight events.   Objective: Vitals:   08/22/20 0432 08/22/20 0920 08/22/20 1251 08/22/20 1602  BP: (!) 157/93 123/76 140/85 124/73  Pulse: 63 (!) 105 99 (!) 102  Resp: Temp: 97.7 F (36.5 C) 98.4 F (36.9 C) 98.4 F (36.9 C) 98.7 F (37.1 C)  TempSrc: Oral Oral Oral   SpO2: 99% 100% 100% 100%  Weight:      Height:        Intake/Output Summary (Last 24 hours) at 08/22/2020 1701 Last data filed at 08/22/2020 1330 Gross per 24 hour  Intake 36.33 ml  Output 2500 ml  Net -2463.67 ml   Filed Weights   08/20/20 0342 08/21/20 0354 08/22/20 0026  Weight: 70.3 kg 68.4 kg 67 kg    Examination:  General exam: elderly female appears comfortable.  Respiratory system: diminished air entry at bases, no wheezing or rhonchi.  Cardiovascular system: S1S2,irregular, no JVD.  Gastrointestinal system: Abdomen is soft, non tender non distended, PEG in place. Central nervous system: arousable, has left hemiplegia.  Extremities: No pedal edema.  Skin: no rashes seen.  Psychiatry: cannot be assessed.     Data Reviewed: I have personally reviewed following labs and imaging studies  CBC: Recent Labs  Lab 08/16/20 0203 08/17/20 0844 08/18/20 0258 08/19/20 0141 08/20/20 0425  WBC 12.8* 10.1 10.9* 15.2* 10.5  NEUTROABS  --   --  7.9* 11.9* 7.3  HGB 7.3* 7.5* 7.9* 8.2* 8.5*  HCT 24.6* 25.7* 26.6* 27.3* 28.5*  MCV 75.9* 76.5* 76.7* 77.3* 76.8*  PLT 306 247 268 309 263    Basic Metabolic Panel: Recent Labs  Lab 08/16/20 0203 08/17/20 0220 08/22/20  0145  NA 133* 136 133*   K 4.3 4.3 4.7  CL 104 107 103  CO2 22 21* 21*  GLUCOSE 140* 89 145*  BUN 31* 31* 28*  CREATININE 1.30* 1.31* 1.17*  CALCIUM 8.3* 8.4* 8.4*    GFR: Estimated Creatinine Clearance: 39 mL/min (A) (by C-G formula based on SCr of 1.17 mg/dL (H)).  Liver Function Tests: No results for input(s): AST, ALT, ALKPHOS, BILITOT, PROT, ALBUMIN in the last 168 hours.  CBG: Recent Labs  Lab 08/22/20 0024 08/22/20 0432 08/22/20 0915 08/22/20 1251 08/22/20 1600  GLUCAP 135* 131* 143* 149* 172*     Recent Results (from the past 240 hour(s))  Urine Culture     Status: None   Collection Time: 08/13/20 10:43 AM   Specimen: Urine, Random  Result Value Ref Range Status   Specimen Description URINE, RANDOM  Final   Special Requests NONE  Final   Culture   Final    NO GROWTH Performed at [facility], [address] [place], [place] [address]    Report Status 08/14/2020 FINAL  Final  Culture, blood (routine x 2)     Status: None   Collection Time: 08/13/20  1:52 PM   Specimen: BLOOD RIGHT HAND  Result Value Ref Range Status   Specimen Description BLOOD RIGHT HAND  Final   Special Requests   Final    BOTTLES DRAWN AEROBIC ONLY Blood Culture adequate volume   Culture   Final    NO GROWTH 5 DAYS Performed at [facility], [address] [place], [place] [address]    Report Status 08/18/2020 FINAL  Final  Culture, blood (routine x 2)     Status: None   Collection Time: 08/13/20  1:58 PM   Specimen: BLOOD RIGHT HAND  Result Value Ref Range Status   Specimen Description BLOOD RIGHT HAND  Final   Special Requests   Final    BOTTLES DRAWN AEROBIC ONLY Blood Culture adequate volume   Culture   Final    NO GROWTH 5 DAYS Performed at [facility], [address], [place], [place] [address]    Report Status 08/18/2020 FINAL  Final  C Difficile Quick Screen (NO PCR Reflex)     Status: Abnormal   Collection Time: 08/14/20  1:48 AM   Specimen: STOOL  Result Value Ref  Range Status   C Diff antigen POSITIVE (A) NEGATIVE Final   C Diff toxin POSITIVE (A) NEGATIVE Final   C Diff interpretation Toxin producing C. difficile detected.  Final    Comment: CRITICAL RESULT CALLED TO, READ BACK BY AND VERIFIED WITH: Lutricia Horsfall RN 08/14/20 0501 JDW Performed at [facility], [address], [place], [place] [address]   SARS CORONAVIRUS 2 (TAT 6-24 HRS) Nasopharyngeal Nasopharyngeal Swab     Status: None   Collection Time: 08/21/20  5:00 AM   Specimen: Nasopharyngeal Swab  Result Value Ref Range Status   SARS Coronavirus 2 NEGATIVE NEGATIVE Final    Comment: (NOTE) SARS-CoV-2 target nucleic acids are NOT DETECTED.  The SARS-CoV-2 RNA is generally detectable in upper and lower respiratory specimens during the acute phase of infection. Negative results do not preclude SARS-CoV-2 infection, do not rule out co-infections with other pathogens, and should not be used as the sole basis for treatment or other patient management decisions. Negative results must be combined with clinical observations, patient history, and epidemiological information. The expected result is Negative.  Fact  Sheet for Patients: HairSlick.no  Fact Sheet for Healthcare Providers: quierodirigir.com  This test is not yet approved or cleared by the [place] FDA and  has been authorized for detection and/or diagnosis of SARS-CoV-2 by FDA under an Emergency Use Authorization (EUA). This EUA will remain  in effect (meaning this test can be used) for the duration of the COVID-19 declaration under Se ction 564(b)(1) of the Act, 21 U.S.C. section 360bbb-3(b)(1), unless the authorization is terminated or revoked sooner.  Performed at [facility], [address], [place], [place] [address]          Radiology Studies: No results found.      Scheduled Meds: .  stroke: mapping our early stages of recovery book    Does not apply Once  . apixaban  5 mg Per Tube BID  . bethanechol  10 mg Per Tube TID  . chlorhexidine  15 mL Mouth Rinse BID  . Chlorhexidine Gluconate Cloth  6 each Topical Daily  . diltiazem  60 mg Per Tube Q6H  . feeding supplement (PROSource TF)  45 mL Per Tube Daily  . ferrous sulfate  300 mg Per Tube TID  . free water  100 mL Per Tube Q8H  . insulin aspart  0-9 Units Subcutaneous Q4H  . insulin glargine  22 Units Subcutaneous Daily  . mouth rinse  15 mL Mouth Rinse q12n4p  . metoprolol tartrate  50 mg Per Tube BID  . metroNIDAZOLE  500 mg Per Tube TID  . rosuvastatin  20 mg Per Tube Daily  . vancomycin  125 mg Per Tube Q6H   Continuous Infusions: . sodium chloride Stopped (08/21/20 1054)  . feeding supplement (JEVITY 1.2 CAL) 1,000 mL (08/21/20 0618)     LOS: 27 days        Kathlen Mody, MD [facility]   To contact the attending provider between 7A-7P or the covering provider during after hours 7P-7A, please log into the web site [email or web address] and access using universal [facility] password for that web site. If you do not have the password, please call the hospital operator.  08/22/2020, 5:01 PM

## 2020-08-23 LAB — GLUCOSE, CAPILLARY
Glucose-Capillary: 118 mg/dL — ABNORMAL HIGH (ref 70–99)
Glucose-Capillary: 198 mg/dL — ABNORMAL HIGH (ref 70–99)
Glucose-Capillary: 175 mg/dL — ABNORMAL HIGH (ref 70–99)
Glucose-Capillary: 135 mg/dL — ABNORMAL HIGH (ref 70–99)

## 2020-08-23 MED ORDER — METOPROLOL TARTRATE 50 MG PO TABS: 50.0000 mg | ORAL_TABLET | Freq: Two times a day (BID) | ORAL | Status: DC

## 2020-08-23 MED ORDER — ACETAMINOPHEN 160 MG/5ML PO SOLN: 650.0000 mg | ORAL | 0 refills | Status: DC | PRN

## 2020-08-23 MED ORDER — BETHANECHOL CHLORIDE 10 MG PO TABS: 10.0000 mg | ORAL_TABLET | Freq: Three times a day (TID) | ORAL | Status: DC

## 2020-08-23 MED ORDER — METRONIDAZOLE 50 MG/ML ORAL SUSPENSION: 500.0000 mg | Freq: Three times a day (TID) | ORAL | 0 refills | Status: DC

## 2020-08-23 MED ORDER — DILTIAZEM 12 MG/ML ORAL SUSPENSION: 60.0000 mg | Freq: Four times a day (QID) | ORAL | Status: DC

## 2020-08-23 MED ORDER — PROSOURCE TF PO LIQD: 45.0000 mL | Freq: Every day | ORAL | Status: DC

## 2020-08-23 MED ORDER — INSULIN ASPART 100 UNIT/ML ~~LOC~~ SOLN: SUBCUTANEOUS | 11 refills | Status: AC

## 2020-08-23 MED ORDER — INSULIN GLARGINE 100 UNIT/ML ~~LOC~~ SOLN: 22.0000 [IU] | Freq: Every day | SUBCUTANEOUS | 11 refills | Status: DC

## 2020-08-23 MED ORDER — VANCOMYCIN 50 MG/ML ORAL SOLUTION: 125.0000 mg | Freq: Four times a day (QID) | ORAL | 0 refills | Status: DC

## 2020-08-23 MED ORDER — JEVITY 1.2 CAL PO LIQD: 1000.0000 mL | ORAL | 0 refills | Status: DC

## 2020-08-23 MED ORDER — FREE WATER: 100.0000 mL | Freq: Three times a day (TID) | Status: DC

## 2020-08-23 MED ORDER — FERROUS SULFATE 300 (60 FE) MG/5ML PO SYRP
300.0000 mg | ORAL_SOLUTION | Freq: Three times a day (TID) | ORAL | 3 refills | Status: DC
Start: 2020-08-23 — End: 2020-11-18

## 2020-08-23 MED ORDER — ROSUVASTATIN CALCIUM 20 MG PO TABS: 20.0000 mg | ORAL_TABLET | Freq: Every day | ORAL | Status: DC

## 2020-08-23 NOTE — Progress Notes (Signed)
[facility] picked up pt for transport to [facility]. Pt stable VS. Tolerated well. Called spouse to report patient was on the way to SNF. Spouse acknowledged understanding.

## 2020-08-23 NOTE — Discharge Summary (Signed)
Physician Discharge Summary  Samantha Paul TXM:[id number] DOB: 13-Jul-1946 DOA: 07/26/2020  PCP: Elwyn Reach, MD  Admit date: 07/26/2020 Discharge date: 08/23/2020  Admitted From: HOME Disposition:  SNF  Recommendations for Outpatient Follow-up:  1. Follow up with PCP in 1-2 weeks 2. Please obtain BMP/CBC in one week Please follow up with neurology as recommended.  Please follow up with urology for voiding trial in the outpatient setting.  Recommend outpatient follow up with SLP Recommend outpatient follow up with palliative care   Discharge Condition:stable.  CODE STATUS:full code.  Diet recommendation: tube feeds.   Brief/Interim Summary: Samantha Paul a 74 y.o.year old femalewith medical history significant for atrial fibrillation with poor adherence to Eliquis, diabetes, HTN, prior CVA who presented on 8/1/2021with acute onset right gaze deviation, left-sided hemiplegia receptive/expressive aphasia after unwitnessed fall heard by her husband who found her in the kitchen with the above findings. Code stroke was called and CT head showed no acute findings, but CTA head/neck showed embolic occlusion of the right carotid with minimal flow seen in the right MCA territory with large penumbra remainder of the right MCA territory. Patient was admitted to neuro ICU and underwent TPA thrombectomy patient was intubated for revascularization procedure and required prolonged mechanical ventilation due to poor mentation before successful extubation on 8/14.  Hospital course was also complicated by Klebsiella pneumonia (respiratory culture on 8/8) requiring course of Zosyn followed by ceftriaxone for total of 9 days (8/7-8/15)  TRH was consulted on 8/19 in the setting of code sepsis as patient was febrile with T-max of one 2.5, tachycardic, tachypneic, WBC of 49.9. She was empirically started on vancomycin and cefepime for HCAP. C. difficile was sent given diarrhea and was found to be positive so  patient was started on oral vancomycin and IV Flagyl. In work-up CT abdomen also showed concern for potential pyelonephritis, but UA was essentially unremarkable. Hospital course complicated by urinary retention and she failed voiding trial. Catheter was replaced and recommend outpatient follow up .  She has completed the treatment for pyelonephritis.  She will finish the course of antibiotics by 08/28/2020.   Discharge Diagnoses:  Principal Problem:   Sepsis ([place]) Active Problems:   Weakness generalized   Hyperlipidemia   Essential hypertension   Diabetes mellitus type 2 in nonobese (HCC)   Acute blood loss anemia   Stroke [facility])   Stroke due to embolism [facility])   Middle cerebral artery embolism, right   Acute respiratory failure with hypoxia ([place])   DNR (do not resuscitate) discussion   Palliative care by specialist   C. difficile colitis   Acute pyelonephritis   Dysphagia due to recent cerebrovascular accident  Sepsis secondary to C. difficile colitis Afebrile, blood cultures remain unremarkable, Decrease oral vancomycin to 125 mg every 6 hours and oral flagyl to complete the course, end date is 08/28/2020.  Patient continues to remain afebrile and sepsis physiology has resolved. Pt opens eyes briefly and does not follow commands. She does not appear to be in distress.  No fevers or chills. No worsening diarrhea.     Possible pyelonephritis. CT of the abdomen on 8/20 showed perinephric fat stranding concerning for acute pyelonephritis unfortunately no culture data are unable to assess urinary symptoms giving mentation after CVA.  Patient completed 7 days of IV cefepime, given hospitalization and risk for MDR. Repeat UA is negative for infection.    Acute right MCA stroke, right ICA total occlusion, s/p TPA and revascularization by IR on 07/26/2020 Patient has  dense left hemiplegia and unable to handle oral secretions or support herself, continues to have poor ability to clear  oral secretions, high risk for aspiration pneumonia.  Patient has a PEG tube and palliative care consulted to help with goals of care discussion. PT OT evaluation recommending SNF and she has a bed that will open up on Sunday. Plan for discharge on sunday   Atrial fibrillation/ Acquired thrombocytopenia Currently rate controlled Continue with Eliquis and Cardizem has been decreased to 60 mg every 6 hours for pause of 2 seconds.  Continue with Lopressor 50 mg twice daily.  Aspirin changed to Eliquis by neurology.  Continue with eliquis. , and lopressor for rate control.    Anemia of chronic disease/ guiac positive stools.  Continue with iron supplementation. Last hemoglobin from 8/26 is around 8.5.    Type 2 diabetes mellitus A1c is 8.7.  CBG (last 3)  Recent Labs (last 2 labs)        Recent Labs    08/22/20 0915 08/22/20 1251 08/22/20 1600  GLUCAP 143* 149* 172*    continue with lantus 22 units and SSI.    Marland Kitchen Hyperlipidemia Continue with Crestor.   Hyponatremia;  Resolved.   Essential hypertension:  Well controlled.    Urinary retention:  Unclear etiology, failed voiding trial, required inand out cath twice and foley had to be re inserted.  Re insert the foley catheter and recommend outpatient followup with UROLOGY for voiding trials.  Repeat UA does not show any infection.    Dysphagia secondary to stroke.  S/p PEG placement and tube feeds running.  SLP following the patient.     In view of her extensive co morbidities, previous strokes, cognitive deficits, poor progression, clinical deterioration, low functional status, palliative care consulted, on further discussions,  family open to all offered and available medical interventions to prolong life. They are hopeful for improvement     Discharge Instructions  Discharge Instructions    Ambulatory referral to Neurology   Complete by: As directed    Follow up with stroke clinic NP  (Jessica Vanschaick or Cecille Rubin, if both not available, consider Zachery Dauer, or [place]) at [facility] in about 4 weeks. Thanks.   Diet - low sodium heart healthy   Complete by: As directed    Discharge instructions   Complete by: As directed    Please follow up with neurology as recommended.     Allergies as of 08/23/2020      Reactions   Metformin And Related Diarrhea   Abdominal pain and diarrhea      Medication List    STOP taking these medications   acetaminophen 325 MG tablet Commonly known as: TYLENOL Replaced by: acetaminophen 160 MG/5ML solution   diltiazem 240 MG 24 hr capsule Commonly known as: Cartia XT   insulin glargine 100 UNIT/ML Solostar Pen Commonly known as: LANTUS Replaced by: insulin glargine 100 UNIT/ML injection   oxyCODONE 5 MG immediate release tablet Commonly known as: Oxy IR/ROXICODONE     TAKE these medications   acetaminophen 160 MG/5ML solution Commonly known as: TYLENOL Place 20.3 mLs (650 mg total) into feeding tube every 4 (four) hours as needed for mild pain (or temp > 37.5 C (99.5 F)). Replaces: acetaminophen 325 MG tablet   apixaban 5 MG Tabs tablet Commonly known as: ELIQUIS Take 1 tablet (5 mg total) by mouth 2 (two) times daily.   bethanechol 10 MG tablet Commonly known as: URECHOLINE Place 1 tablet (10 mg total)  into feeding tube 3 (three) times daily.   blood glucose meter kit and supplies Kit Dispense based on patient and insurance preference. Use up to four times daily as directed. (FOR ICD-10--E11.9).   diltiazem 10 mg/ml  oral suspension Commonly known as: CARDIZEM Place 6 mLs (60 mg total) into feeding tube every 6 (six) hours.   feeding supplement (JEVITY 1.2 CAL) Liqd Place 1,000 mLs into feeding tube continuous.   feeding supplement (PROSource TF) liquid Place 45 mLs into feeding tube daily. Start taking on: August 24, 2020   ferrous sulfate 300 (60 Fe) MG/5ML syrup Place 5 mLs (300 mg total) into feeding  tube 3 (three) times daily.   free water Soln Place 100 mLs into feeding tube every 8 (eight) hours.   insulin aspart 100 UNIT/ML injection Commonly known as: novoLOG CBG 70 - 120: 0 units CBG 121 - 150: 1 unit CBG 151 - 200: 2 units CBG 201 - 250: 3 units CBG 251 - 300: 5 units CBG 301 - 350: 7 units CBG 351 - 400: 9 units   insulin glargine 100 UNIT/ML injection Commonly known as: LANTUS Inject 0.22 mLs (22 Units total) into the skin daily. Start taking on: August 24, 2020 Replaces: insulin glargine 100 UNIT/ML Solostar Pen   metFORMIN 500 MG tablet Commonly known as: Glucophage Take 1 tablet (500 mg total) by mouth 2 (two) times daily with a meal.   metoprolol tartrate 50 MG tablet Commonly known as: LOPRESSOR Place 1 tablet (50 mg total) into feeding tube 2 (two) times daily. What changed:   medication strength  how much to take  how to take this   metroNIDAZOLE 50 mg/ml oral suspension Commonly known as: FLAGYL Place 10 mLs (500 mg total) into feeding tube 3 (three) times daily. Take until 08/28/20   Pen Needles 32G X 4 MM Misc Use as directed with insulin pen   rosuvastatin 20 MG tablet Commonly known as: CRESTOR Place 1 tablet (20 mg total) into feeding tube daily. Start taking on: August 24, 2020 What changed: See the new instructions.   senna-docusate 8.6-50 MG tablet Commonly known as: Senokot-S Take 2 tablets by mouth at bedtime.   vancomycin 50 mg/mL  oral solution Commonly known as: VANCOCIN Place 2.5 mLs (125 mg total) into feeding tube every 6 (six) hours. Take until 08/28/20       Follow-up Information    [facility]. Schedule an appointment as soon as possible for a visit in 4 week(s).   Specialty: Neurology Contact information: [address] [place] [phone]       Elwyn Reach, MD. Schedule an appointment as soon as possible for a visit in 1 week(s).   Specialty:  Internal Medicine Contact information: [address] [address] [phone]              Allergies  Allergen Reactions  . Metformin And Related Diarrhea    Abdominal pain and diarrhea    Consultations:  Neurology  Surgery.     Procedures/Studies: CT HEAD WO CONTRAST  Result Date: 08/01/2020 CLINICAL DATA:  Stroke follow-up. Postop endovascular revascularization of occluded right internal carotid artery distally. EXAM: CT HEAD WITHOUT CONTRAST TECHNIQUE: Contiguous axial images were obtained from the base of the skull through the vertex without intravenous contrast. COMPARISON:  CT head 07/27/2020.  MRI head 07/27/2020 FINDINGS: Brain: Involving acute right MCA infarct. There is progressive edema in the right insula and right  frontal lobe. There is involvement of the basal ganglia with a mild amount of hemorrhage in the right basal ganglia. This is unchanged. This is lateral to the area of calcification in the right basal ganglia. Acute infarct in the caudate is more apparent and lower density today. Right medial frontal lobe infarct also shows progressive edema. Small area of infarct in the right occipital lobe best seen on diffusion-weighted imaging. Generalized atrophy. Negative for hydrocephalus. 1 mm midline shift to the left. No new area of hemorrhage. Chronic infarct in the left pons. Vascular: Negative for hyperdense vessel Skull: Negative Sinuses/Orbits: Mucosal edema paranasal sinuses.  Negative orbit Other: None IMPRESSION: Evolving acute right MCA infarct. Low-density edema infarction is more apparent now in the insula and frontal lobe and basal ganglia. There is progressive low-density in the head of the caudate which did show infarct on prior diffusion imaging. Small amount of hemorrhage in the right basal ganglia is unchanged from the prior CT. Right frontal ACA infarct is unchanged in size but is lower density today. Small acute infarct right occipital  lobe best seen on diffusion-weighted imaging. No new area of hemorrhage since the prior studies. Electronically Signed   By: Franchot Gallo M.D.   On: 08/01/2020 15:43   CT HEAD WO CONTRAST  Addendum Date: 07/27/2020   ADDENDUM REPORT: 07/27/2020 13:27 ADDENDUM: Study discussed by telephone with Dr. Leonie Man on 07/27/2020 at 1313 hours. He advised that follow-up MRI is pending. Electronically Signed   By: Genevie Ann M.D.   On: 07/27/2020 13:27   Result Date: 07/27/2020 CLINICAL DATA:  74 year old female status post code stroke presentation with right ICA terminus occlusion. Status post IV tPA, endovascular revascularization. EXAM: CT HEAD WITHOUT CONTRAST TECHNIQUE: Contiguous axial images were obtained from the base of the skull through the vertex without intravenous contrast. COMPARISON:  Post treatment plain head CT, CTA head, CT Perfusion, presentation head CT yesterday. FINDINGS: Brain: Mass effect now on the right lateral ventricle, especially the right frontal horn (series 6, image 13) with mixed hypo- and hyper density in the anterior right frontal lobe, right inferior frontal gyrus and right basal ganglia (series 6, image 13). No significant midline shift. No extra-axial hemorrhage or ventriculomegaly. Superimposed dystrophic basal ganglia calcifications are stable. Posterior to the anterior genu gray-white matter differentiation appears largely stable. There is possible early cytotoxic edema at the posterior operculum. Stable left hemisphere and posterior fossa gray-white matter differentiation. No other intracranial blood products. Vascular: Calcified atherosclerosis at the skull base. No suspicious intracranial vascular hyperdensity. Skull: No acute osseous abnormality identified. Sinuses/Orbits: Intubated, small volume of fluid layering in the pharynx. Mild paranasal sinus fluid or mucosal thickening. Tympanic cavities and mastoids remain clear. Other: Disconjugate gaze now.  Scalp soft tissues remain  negative. IMPRESSION: 1. New mass effect in the anterior right frontal lobe with regional increasing mixed hypo- and hyperdensity. Favor a combination of anterior right MCA territory cytotoxic edema and petechial hemorrhage. 2. No malignant hemorrhagic transformation at this time. No midline shift or loss of basilar cisterns. 3. Serial noncontrast Head CT for surveillance of mass effect recommended. Electronically Signed: By: Genevie Ann M.D. On: 07/27/2020 12:34   CT HEAD WO CONTRAST  Result Date: 07/26/2020 CLINICAL DATA:  Stroke, follow-up post revascularization. EXAM: CT HEAD WITHOUT CONTRAST TECHNIQUE: Contiguous axial images were obtained from the base of the skull through the vertex without intravenous contrast. COMPARISON:  CT angiogram head/neck and CT perfusion 07/26/2020, noncontrast head CT 07/26/2020 FINDINGS: Brain: Redemonstrated mineralization within  the bilateral basal ganglia. There is subtle loss of gray-white differentiation within the right insula consistent with acute ischemic infarction. Additionally, there is subtle asymmetric hyperdensity of the right caudate and lentiform nuclei which may reflect contrast staining/enhancement at site of acute infarction. No convincing evidence of acute intracranial hemorrhage. Stable background generalized parenchymal atrophy and chronic small vessel ischemic disease. No extra-axial fluid collection. No evidence of intracranial mass. No midline shift. Vascular: Residual circulating contrast material limits evaluation for hyperdense vessels Skull: Normal. Negative for fracture or focal lesion. Sinuses/Orbits: Visualized orbits show no acute finding. Mild ethmoid and maxillary sinus mucosal thickening at the imaged levels. No significant mastoid effusion IMPRESSION: Subtle changes of acute ischemic infarction within the right insula. Additionally, there is subtle asymmetric hyperdensity of the right caudate and lentiform nuclei which may reflect contrast  staining or enhancement at site of acute infarction. No convincing evidence of acute intracranial hemorrhage. Stable background generalized parenchymal atrophy and chronic small vessel ischemic disease. Electronically Signed   By: Kellie Simmering DO   On: 07/26/2020 15:47   CT CHEST W CONTRAST  Result Date: 08/14/2020 CLINICAL DATA:  Abdominal pain, fever, sepsis EXAM: CT CHEST, ABDOMEN, AND PELVIS WITH CONTRAST TECHNIQUE: Multidetector CT imaging of the chest, abdomen and pelvis was performed following the standard protocol during bolus administration of intravenous contrast. CONTRAST:  155m OMNIPAQUE IOHEXOL 300 MG/ML  SOLN COMPARISON:  07/29/2020, 06/29/2020 FINDINGS: CT CHEST FINDINGS Cardiovascular: Heart is mildly enlarged with prominent biatrial dilatation. No pericardial effusion. There is calcification of the mitral annulus. The aorta is normal in caliber with no evidence of aneurysm or dissection. Mediastinum/Nodes: An enteric catheter extends into the gastric lumen. Stable appearance of the thyroid. Trachea is unremarkable. No pathologic adenopathy. Lungs/Pleura: Respiratory motion limits evaluation of the lungs. There is upper lobe predominant interlobular septal thickening, with trace bilateral pleural effusions, suggesting mild edema. No focal consolidation or pneumothorax. Musculoskeletal: No acute or destructive bony lesions. Reconstructed images demonstrate no additional findings. CT ABDOMEN PELVIS FINDINGS Hepatobiliary: No focal liver abnormality is seen. Status post cholecystectomy. No biliary dilatation. Pancreas: Unremarkable. No pancreatic ductal dilatation or surrounding inflammatory changes. Spleen: Normal in size without focal abnormality. Adrenals/Urinary Tract: Evaluation limited due to respiratory motion. There is heterogeneous decreased enhancement of the left kidney, with mild renal edema and perinephric fat stranding. Findings are concerning for acute pyelonephritis. No evidence of  renal abscess. Large right renal cyst again noted. The remainder of the right kidney enhances normally. The adrenals are unremarkable. The bladder is moderately distended without focal abnormality. Stomach/Bowel: Enteric catheter within the bladder lumen. No bowel obstruction or ileus. No wall thickening or inflammatory change. Rectal catheter in place. Vascular/Lymphatic: No significant vascular findings are present. No enlarged abdominal or pelvic lymph nodes. Reproductive: Uterus and bilateral adnexa are unremarkable. Other: No free fluid or free gas. Mild subcutaneous edema within the bilateral flanks. No abdominal wall hernia. Musculoskeletal: No acute or destructive bony lesions. Reconstructed images demonstrate no additional findings. IMPRESSION: 1. Heterogeneous decreased enhancement of the left kidney, with mild renal edema and perinephric fat stranding. Findings are concerning for acute pyelonephritis. No evidence of renal abscess. 2. Mild cardiomegaly with prominent biatrial dilatation. 3. Trace bilateral pleural effusions and mild interstitial edema. Electronically Signed   By: MRanda NgoM.D.   On: 08/14/2020 15:59   MR BRAIN WO CONTRAST  Result Date: 07/27/2020 CLINICAL DATA:  Stroke follow-up EXAM: MRI HEAD WITHOUT CONTRAST TECHNIQUE: Multiplanar, multiecho pulse sequences of the brain and surrounding structures  were obtained without intravenous contrast. COMPARISON:  Brain MRI 07/10/2018 FINDINGS: Brain: Multifocal ischemic infarction within the right MCA territory, greatest at the frontal operculum and insula. There is also cortical ischemia within the right ACA territory and right PCA territory. There is a punctate focus of acute ischemia in the left occipital lobe. Large area of infarction in the right basal ganglia. There is petechial hemorrhage versus is contrast staining in the right basal ganglia as well. Moderate edema associated with the ischemic areas. There is mass effect on the  frontal horn of the right lateral ventricle. No herniation. There is an old left pontine infarct. No chronic microhemorrhage. Normal midline structures. Vascular: Normal flow voids. Skull and upper cervical spine: Normal marrow signal. Sinuses/Orbits: Right maxillary sinus mucosal thickening. Normal orbits. Other: None. IMPRESSION: 1. Multifocal ischemic infarction within the right MCA territory, greatest at the frontal operculum and insula. 2. Additional cortical ischemia within the right ACA territory and right PCA territory. 3. Large area of infarction in the right basal ganglia with petechial hemorrhage versus contrast staining. 4. Punctate focus of acute ischemia in the left occipital lobe. Electronically Signed   By: Ulyses Jarred M.D.   On: 07/27/2020 23:50   CT ABDOMEN PELVIS W CONTRAST  Result Date: 08/14/2020 CLINICAL DATA:  Abdominal pain, fever, sepsis EXAM: CT CHEST, ABDOMEN, AND PELVIS WITH CONTRAST TECHNIQUE: Multidetector CT imaging of the chest, abdomen and pelvis was performed following the standard protocol during bolus administration of intravenous contrast. CONTRAST:  155m OMNIPAQUE IOHEXOL 300 MG/ML  SOLN COMPARISON:  07/29/2020, 06/29/2020 FINDINGS: CT CHEST FINDINGS Cardiovascular: Heart is mildly enlarged with prominent biatrial dilatation. No pericardial effusion. There is calcification of the mitral annulus. The aorta is normal in caliber with no evidence of aneurysm or dissection. Mediastinum/Nodes: An enteric catheter extends into the gastric lumen. Stable appearance of the thyroid. Trachea is unremarkable. No pathologic adenopathy. Lungs/Pleura: Respiratory motion limits evaluation of the lungs. There is upper lobe predominant interlobular septal thickening, with trace bilateral pleural effusions, suggesting mild edema. No focal consolidation or pneumothorax. Musculoskeletal: No acute or destructive bony lesions. Reconstructed images demonstrate no additional findings. CT ABDOMEN  PELVIS FINDINGS Hepatobiliary: No focal liver abnormality is seen. Status post cholecystectomy. No biliary dilatation. Pancreas: Unremarkable. No pancreatic ductal dilatation or surrounding inflammatory changes. Spleen: Normal in size without focal abnormality. Adrenals/Urinary Tract: Evaluation limited due to respiratory motion. There is heterogeneous decreased enhancement of the left kidney, with mild renal edema and perinephric fat stranding. Findings are concerning for acute pyelonephritis. No evidence of renal abscess. Large right renal cyst again noted. The remainder of the right kidney enhances normally. The adrenals are unremarkable. The bladder is moderately distended without focal abnormality. Stomach/Bowel: Enteric catheter within the bladder lumen. No bowel obstruction or ileus. No wall thickening or inflammatory change. Rectal catheter in place. Vascular/Lymphatic: No significant vascular findings are present. No enlarged abdominal or pelvic lymph nodes. Reproductive: Uterus and bilateral adnexa are unremarkable. Other: No free fluid or free gas. Mild subcutaneous edema within the bilateral flanks. No abdominal wall hernia. Musculoskeletal: No acute or destructive bony lesions. Reconstructed images demonstrate no additional findings. IMPRESSION: 1. Heterogeneous decreased enhancement of the left kidney, with mild renal edema and perinephric fat stranding. Findings are concerning for acute pyelonephritis. No evidence of renal abscess. 2. Mild cardiomegaly with prominent biatrial dilatation. 3. Trace bilateral pleural effusions and mild interstitial edema. Electronically Signed   By: MRanda NgoM.D.   On: 08/14/2020 15:59   CT CEREBRAL PERFUSION  W CONTRAST  Result Date: 07/26/2020 CLINICAL DATA:  Acute presentation with left body weakness and speech disturbance. EXAM: CT ANGIOGRAPHY HEAD AND NECK CT PERFUSION BRAIN TECHNIQUE: Multidetector CT imaging of the head and neck was performed using the  standard protocol during bolus administration of intravenous contrast. Multiplanar CT image reconstructions and MIPs were obtained to evaluate the vascular anatomy. Carotid stenosis measurements (when applicable) are obtained utilizing NASCET criteria, using the distal internal carotid diameter as the denominator. Multiphase CT imaging of the brain was performed following IV bolus contrast injection. Subsequent parametric perfusion maps were calculated using RAPID software. CONTRAST:  Not annotated COMPARISON:  Head CT earlier same day FINDINGS: CTA NECK FINDINGS Aortic arch: No aortic atherosclerotic calcification seen. No aneurysm or dissection. Branching pattern is normal with wide patency of the origins. Right carotid system: Common carotid artery widely patent to the bifurcation. No atherosclerotic disease at the carotid bifurcation. ICA widely patent. Left carotid system: Common carotid artery widely patent to the bifurcation. Carotid bifurcation is normal without soft or calcified plaque. ICA widely patent. Vertebral arteries: Both vertebral artery origins are widely patent. Both vertebral arteries are normal through the cervical region to the foramen magnum. Skeleton: Ordinary cervical spondylosis. Other neck: No mass or lymphadenopathy. Upper chest: No active chest disease. 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 Review of the MIP images confirms the above findings CTA HEAD FINDINGS Anterior circulation: Complete occlusion of the right carotid terminus. Minimal flow visible in the right MCA territory. Right ACA receives it supply through the anterior communicating route. No left stenosis or occlusion. Right PCA arises from the carotid artery immediately proximal to the occlusion. Posterior circulation: Both vertebral arteries patent to the basilar. Atherosclerotic narrowing the distal vertebral arteries and of the basilar. Posterior  circulation branch vessels are patent. Right PCA receives most of it supply from the anterior circulation. Venous sinuses: Patent and normal. Anatomic variants: None significant. 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 Minimal flow seen in the right MCA territory. Right ACA territory appears normally perfused, probably due to a patent anterior communicating artery. 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 Large penumbra, 136 cc in the remainder of the right MCA territory. No aortic atherosclerotic disease seen. No atherosclerotic disease at either carotid bifurcation. Some atherosclerotic narrowing of the distal vertebral arteries and basilar artery. Preliminary report sent by text at 1238 hours. Discussed with stroke PA 1242 hours. Electronically Signed   By: MarNelson ChimesD.   On: 07/26/2020 12:48   DG CHEST PORT 1 VIEW  Result Date: 08/16/2020 CLINICAL DATA:  74 38ar old female with history of stroke. EXAM: PORTABLE CHEST 1 VIEW COMPARISON:  Chest x-ray 08/13/2020. FINDINGS: Feeding tube extending into the stomach with tip coiled back in the proximal stomach. Lung volumes are normal. No consolidative airspace disease. No pleural effusions. No evidence of pulmonary edema. No pneumothorax. No suspicious appearing pulmonary nodules or masses are noted. Mild cardiomegaly. Upper mediastinal contours are within normal limits allowing for patient positioning. Surgical clips project over the right upper quadrant of the abdomen, likely from prior cholecystectomy. IMPRESSION: 1. Tip of feeding tube is in the proximal stomach. 2. No radiographic evidence of acute cardiopulmonary disease. 3. Mild cardiomegaly. Electronically Signed  By: Vinnie Langton M.D.   On: 08/16/2020  10:09   DG CHEST PORT 1 VIEW  Result Date: 08/13/2020 CLINICAL DATA:  Febrile with leukocytosis. Dysphagia due to recent cerebrovascular accident. EXAM: PORTABLE CHEST 1 VIEW COMPARISON:  08/05/2020 FINDINGS: Feeding tube tip projects at the gastric cardia, similar to prior. Cardiomediastinal silhouette is similar to prior. Similar mild elevation the right hemidiaphragm. No consolidation. Linear bibasilar opacities, favored to represent atelectasis. No sizable pleural effusion or discernible pneumothorax. Biapical pleuroparenchymal scarring. The visualized skeletal structures are unremarkable. Apparent widening of the left glenohumeral joint. IMPRESSION: 1. No evidence of consolidation, although portable technique limits evaluation of the lung bases. Dedicated PA and lateral radiographs could better evaluate if the patient is able. 2. Apparent widening of the left glenohumeral joint. Consider dedicated left shoulder radiographs to further evaluate. Electronically Signed   By: Margaretha Sheffield MD   On: 08/13/2020 11:54   DG CHEST PORT 1 VIEW  Result Date: 08/05/2020 CLINICAL DATA:  Fever EXAM: PORTABLE CHEST 1 VIEW COMPARISON:  08/03/2020 FINDINGS: Endotracheal tube terminates 3 cm above the carina. Weighted feeding tube terminates in the gastric cardia. Lungs are clear. No pleural effusion or pneumothorax. Mild eventration of the right hemidiaphragm. Cardiomegaly. IMPRESSION: Endotracheal tube terminates 3 cm above the carina. Weighted feeding tube terminates in the gastric cardia. Electronically Signed   By: Julian Hy M.D.   On: 08/05/2020 09:53   DG CHEST PORT 1 VIEW  Result Date: 08/03/2020 CLINICAL DATA:  Fevers. EXAM: PORTABLE CHEST 1 VIEW COMPARISON:  08/01/2020 FINDINGS: ETT tip is 3.4 cm above the carina. Feeding tube is noted with tip looped in the stomach. Mild cardiac enlargement. There is no pleural effusion or edema. No airspace opacities. IMPRESSION: 1. Stable position of support  apparatus. 2. Mild cardiac enlargement. Electronically Signed   By: Kerby Moors M.D.   On: 08/03/2020 10:29   DG CHEST PORT 1 VIEW  Result Date: 08/01/2020 CLINICAL DATA:  Cardiomegaly, CHF EXAM: PORTABLE CHEST 1 VIEW COMPARISON:  07/27/2020 chest radiograph. FINDINGS: Endotracheal tube tip is 3.3 cm above the carina. Weighted enteric tube loops in the proximal stomach with the tip overlying the gastric cardia/fundus. Stable cardiomediastinal silhouette with mild to moderate cardiomegaly. No pneumothorax. No pleural effusion. No overt pulmonary edema. No acute consolidative airspace disease. IMPRESSION: Well-positioned support structures. Stable mild-to-moderate cardiomegaly without overt pulmonary edema. Electronically Signed   By: Ilona Sorrel M.D.   On: 08/01/2020 08:57   DG Chest Port 1 View  Result Date: 07/27/2020 CLINICAL DATA:  History of stroke and hypertension EXAM: PORTABLE CHEST 1 VIEW COMPARISON:  07/26/2020 FINDINGS: Endotracheal and enteric tubes are unchanged in position. Shallow inspiration. Cardiac enlargement. No vascular congestion or edema. Lungs are clear with improvement since prior study. No effusions. Surgical clips in the right upper quadrant. IMPRESSION: Cardiac enlargement. No evidence of active pulmonary disease. Improvement since prior study. Electronically Signed   By: Lucienne Capers M.D.   On: 07/27/2020 06:35   DG CHEST PORT 1 VIEW  Result Date: 07/26/2020 CLINICAL DATA:  Inhibition EXAM: PORTABLE CHEST 1 VIEW COMPARISON:  Chest x-ray dated 06/29/2020. FINDINGS: Endotracheal tube appears well positioned with tip approximately 3 cm above the carina. Enteric tube passes below the diaphragm. Cardiomegaly. Central pulmonary vascular congestion. No pleural effusion or pneumothorax is seen. IMPRESSION: 1. Endotracheal tube appears well positioned with tip approximately 3 cm above the carina. 2. Cardiomegaly with central pulmonary vascular congestion indicating CHF/volume  overload. Electronically Signed   By: Cherlynn Kaiser  Enriqueta Shutter M.D.   On: 07/26/2020 16:58   DG Abd Portable 1V  Result Date: 07/29/2020 CLINICAL DATA:  Feeding into placement EXAM: PORTABLE ABDOMEN - 1 VIEW COMPARISON:  July 26, 2020 FINDINGS: Nasogastric tube removed. Feeding tube present with tip in the stomach. Visualized bowel unremarkable. No free air evident. Visualized lungs clear. Endotracheal tube tip is 4.0 cm above the carina. IMPRESSION: Feeding tube tip in stomach. Visualized bowel gas pattern unremarkable. Visualized lungs clear. Electronically Signed   By: Lowella Grip III M.D.   On: 07/29/2020 12:02   DG Abd Portable 1V  Result Date: 07/26/2020 CLINICAL DATA:  OG tube placement EXAM: PORTABLE ABDOMEN - 1 VIEW COMPARISON:  None. FINDINGS: Enteric tube appears well positioned in the stomach. Visualized bowel gas pattern is nonobstructive. Cholecystectomy clips in the RIGHT upper quadrant. IMPRESSION: Enteric tube appears well positioned in the stomach. Electronically Signed   By: Franki Cabot M.D.   On: 07/26/2020 16:57   IR PERCUTANEOUS ART THROMBECTOMY/INFUSION INTRACRANIAL INC DIAG ANGIO  Result Date: 07/28/2020 INDICATION: Acute onset of left-sided hemiplegia, right gaze deviation and slurred speech. Occluded right internal carotid artery supraclinoid segment, right middle cerebral artery and the right anterior cerebral artery. EXAM: 1. EMERGENT LARGE VESSEL OCCLUSION THROMBOLYSIS (anterior CIRCULATION) COMPARISON:  CT angiogram of the head and neck of July 26, 2020. MEDICATIONS: Ancef 2 g IV antibiotic was administered within 1 hour of the procedure. ANESTHESIA/SEDATION: General anesthesia CONTRAST:  Isovue 300 approximately 100 mL FLUOROSCOPY TIME:  Fluoroscopy Time: 38 minutes 36 seconds (1410 mGy). COMPLICATIONS: None immediate. TECHNIQUE: Following a full explanation of the procedure along with the potential associated complications, an informed witnessed consent was obtained the  patient's spouse via an interpreter. The risks of intracranial hemorrhage of 10%, worsening neurological deficit, ventilator dependency, death and inability to revascularize were all reviewed in detail with the patient's spouse. The patient was then put under general anesthesia by the Department of Anesthesiology at [facility]. The right groin was prepped and draped in the usual sterile fashion. Thereafter using modified Seldinger technique, transfemoral access into the right common femoral artery was obtained without difficulty. Over a 0.035 inch guidewire a 5 French Pinnacle sheath was inserted. Through this, and also over a 0.035 inch guidewire a 8 French 25 cm catheter was advanced to the aortic arch region and selectively positioned in the innominate artery and right common carotid artery. FINDINGS: Innominate arteriogram demonstrates the origin of the right subclavian artery and the right common carotid artery to be widely patent. Moderate tortuosity is seen of the right common carotid artery proximally. The right vertebral artery origin is widely patent. The vessel is seen to opacify to the cranial skull base. The opacified portion of the basilar artery on the AP projection demonstrates patency with suggestion of a 50% stenosis of the right vertebrobasilar junction. The right common carotid arteriogram demonstrates the right external carotid artery and its major branches to be widely patent. The right internal carotid artery at the bulb to the cranial skull base is patent with moderate severe tortuosity involving the mid right M1 segment associated with a 50% stenosis. The petrous and proximal cavernous segments are widely patent. There is complete angiographic occlusion of the right internal carotid artery just distal to the origin of the right posterior communicating artery supplying the right posterior cerebral artery distribution. PROCEDURE: The diagnostic JB 1 catheter in the right common  carotid artery was exchanged over a 0.035 inch 300 cm Rosen exchange guidewire for an  087 95 cm balloon guide catheter which had been prepped with 50% contrast and 50% heparinized saline infusion. Guidewire was removed. Good aspiration obtained from the hub of the balloon guide catheter. A gentle contrast injection demonstrated no evidence of vasospasm, or of intraluminal filling defects. Over a 0.014 inch standard Synchro micro guidewire, an 027 160 cm Marksman microcatheter inside of an 071 135 cm Zoom aspiration catheter was advanced without difficulty to the distal cavernous segment of the right internal carotid artery. The micro guidewire was then gently manipulated without difficulty and advanced into the inferior division of the right middle cerebral artery M2 M3 region followed by the microcatheter. The guidewire was removed. Mild resistance was again to aspiration which cleared with more proximal position of the microcatheter. A gentle control arteriogram performed through the microcatheter demonstrated safe position of tip of the microcatheter, which was connected to continuous heparinized saline infusion. A 3 mm x 20 mm Solitaire X retrieval device was then advanced to the distal end of the microcatheter. The O ring on the delivery microcatheter was loosened. With slight forward gentle traction with the right hand on the delivery micro guidewire with the left hand the delivery microcatheter was retrieved unsheathing the retrieval device. The 071 aspiration catheter was engaged in the occluded right middle cerebral artery M1 segment. Continued aspiration was applied using an aspiration device with proximal flow arrest in the right internal carotid artery, and aspiration using a 60 mL syringe at the hub of the balloon guide catheter for approximately 2-1/2 minutes. Thereafter, the combination of the retrieval device, the microcatheter, aspiration catheter was retrieved and removed as aspiration was applied  at the hub of the balloon guide catheter. Large clot and small pieces were seen from within the aspiration catheter. A control arteriogram performed through the balloon guide following reversal of flow arrest in the right internal carotid artery demonstrated wide patency of the superior and inferior divisions of the right middle cerebral artery. A control arteriogram performed following infusion of 25 mcg of nitroglycerin intra-arterially now demonstrated significantly improved caliber and flow through the superior and inferior divisions. There continued be slow flow involving the M3 M4 branches of the anterior of peri insular branches consistent with the site of the core seen on the CT perfusion studies. A TICI 2C revascularization of the right MCA distribution had been achieved. The right anterior cerebral artery was now also widely patent with a TICI 3 revascularization. Patency of the right posterior communicating artery and the right posterior cerebral artery territory was intact. Control arteriogram performed through the balloon guide in the right common carotid artery demonstrated free flow in the right internal carotid artery extra cranially and intracranially. This was then removed. The 8 French Pinnacle sheath in the right groin was replaced with an 8 French Angio-Seal closure device for hemostasis. The distal pulses remained Dopplerable in both the DPs and posterior tibial arteries bilaterally unchanged. Post procedure CT scan of the brain demonstrated contrast blush in the right basal ganglia region. No gross mass effect or midline shift was noted. The patient was left intubated on account of the patient's neurologic condition prior to intubation. She was then transported to the neuro ICU for post thrombectomy care. IMPRESSION: Status post endovascular revascularization of occluded right internal carotid artery supraclinoid segment, the right middle cerebral and the right anterior cerebral artery with 1  pass with a 3 mm x 20 mm Solitaire X retrieval device, contact aspiration and proximal flow arrest achieving a TICI 2C  revascularization. PLAN: Follow-up as per referring MD. Electronically Signed   By: Luanne Bras M.D.   On: 07/27/2020 14:02   CT HEAD CODE STROKE WO CONTRAST  Result Date: 07/26/2020 CLINICAL DATA:  Code stroke. Left-sided weakness. Right gaze disturbance. EXAM: CT HEAD WITHOUT CONTRAST TECHNIQUE: Contiguous axial images were obtained from the base of the skull through the vertex without intravenous contrast. COMPARISON:  07/09/2018 FINDINGS: Brain: Age related volume loss. Chronic small-vessel ischemic changes of the brainstem, cerebellum and cerebral hemispheres. Physiologic basal ganglia calcification. No visible acute infarction, mass lesion, hemorrhage, hydrocephalus or extra-axial collection. Vascular: There is atherosclerotic calcification of the major vessels at the base of the brain. Skull: Negative Sinuses/Orbits: Clear/normal Other: None ASPECTS ([place] Stroke Program Early CT Score) - Ganglionic level infarction (caudate, lentiform nuclei, internal capsule, insula, M1-M3 cortex): 7 - Supraganglionic infarction (M4-M6 cortex): 3 Total score (0-10 with 10 being normal): 10 IMPRESSION: 1. No acute CT finding. Chronic small-vessel ischemic changes throughout the brain. Physiologic calcification of basal ganglia. 2. ASPECTS is 10 3. Discussed with Dr. Cheral Marker during interpretation at 12 17 hours. Electronically Signed   By: Nelson Chimes M.D.   On: 07/26/2020 12:22   VAS [place] UPPER EXTREMITY VENOUS DUPLEX  Result Date: 08/07/2020 UPPER VENOUS STUDY  Indications: Edema Risk Factors: None identified. Limitations: Body habitus, poor ultrasound/tissue interface and patient positioning, patient movement. Comparison Study: No prior studies. Performing Technologist: Oliver Hum RVT  Examination Guidelines: A complete evaluation includes B-mode imaging, spectral Doppler, color  Doppler, and power Doppler as needed of all accessible portions of each vessel. Bilateral testing is considered an integral part of a complete examination. Limited examinations for reoccurring indications may be performed as noted.  Right Findings: +----------+------------+---------+-----------+----------+-------+ RIGHT     CompressiblePhasicitySpontaneousPropertiesSummary +----------+------------+---------+-----------+----------+-------+ Subclavian    Full       Yes       Yes                      +----------+------------+---------+-----------+----------+-------+  Left Findings: +----------+------------+---------+-----------+----------+-------+ LEFT      CompressiblePhasicitySpontaneousPropertiesSummary +----------+------------+---------+-----------+----------+-------+ IJV           Full       Yes       Yes                      +----------+------------+---------+-----------+----------+-------+ Subclavian    Full       Yes       Yes                      +----------+------------+---------+-----------+----------+-------+ Axillary      Full       Yes       Yes                      +----------+------------+---------+-----------+----------+-------+ Brachial      Full       Yes       Yes                      +----------+------------+---------+-----------+----------+-------+ Radial        Full                                          +----------+------------+---------+-----------+----------+-------+ Ulnar         Full                                          +----------+------------+---------+-----------+----------+-------+  Cephalic      None                                   Acute  +----------+------------+---------+-----------+----------+-------+ Basilic       Full                                          +----------+------------+---------+-----------+----------+-------+  Summary:  Right: No evidence of thrombosis in the subclavian.  Left: No  evidence of deep vein thrombosis in the upper extremity. Findings consistent with acute superficial vein thrombosis involving the left cephalic vein.  *See table(s) above for measurements and observations.  Diagnosing physician: Servando Snare MD Electronically signed by Servando Snare MD on 08/07/2020 at 8:17:46 AM.    Final    CT ANGIO HEAD CODE STROKE  Result Date: 07/26/2020 CLINICAL DATA:  Acute presentation with left body weakness and speech disturbance. EXAM: CT ANGIOGRAPHY HEAD AND NECK CT PERFUSION BRAIN TECHNIQUE: Multidetector CT imaging of the head and neck was performed using the standard protocol during bolus administration of intravenous contrast. Multiplanar CT image reconstructions and MIPs were obtained to evaluate the vascular anatomy. Carotid stenosis measurements (when applicable) are obtained utilizing NASCET criteria, using the distal internal carotid diameter as the denominator. Multiphase CT imaging of the brain was performed following IV bolus contrast injection. Subsequent parametric perfusion maps were calculated using RAPID software. CONTRAST:  Not annotated COMPARISON:  Head CT earlier same day FINDINGS: CTA NECK FINDINGS Aortic arch: No aortic atherosclerotic calcification seen. No aneurysm or dissection. Branching pattern is normal with wide patency of the origins. Right carotid system: Common carotid artery widely patent to the bifurcation. No atherosclerotic disease at the carotid bifurcation. ICA widely patent. Left carotid system: Common carotid artery widely patent to the bifurcation. Carotid bifurcation is normal without soft or calcified plaque. ICA widely patent. Vertebral arteries: Both vertebral artery origins are widely patent. Both vertebral arteries are normal through the cervical region to the foramen magnum. Skeleton: Ordinary cervical spondylosis. Other neck: No mass or lymphadenopathy. Upper chest: No active chest disease. 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 Review of the MIP images confirms the above findings CTA HEAD FINDINGS Anterior circulation: Complete occlusion of the right carotid terminus. Minimal flow visible in the right MCA territory. Right ACA receives it supply through the anterior communicating route. No left stenosis or occlusion. Right PCA arises from the carotid artery immediately proximal to the occlusion. Posterior circulation: Both vertebral arteries patent to the basilar. Atherosclerotic narrowing the distal vertebral arteries and of the basilar. Posterior circulation branch vessels are patent. Right PCA receives most of it supply from the anterior circulation. Venous sinuses: Patent and normal. Anatomic variants: None significant. 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 Minimal flow seen in the right MCA territory. Right ACA territory appears normally perfused, probably due to a patent anterior communicating artery. 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 Large penumbra, 136 cc in the remainder of the right MCA territory. No aortic atherosclerotic disease seen. No atherosclerotic disease at either carotid bifurcation. Some  atherosclerotic narrowing of the distal vertebral arteries and basilar artery. Preliminary report sent by text at 1238 hours. Discussed with stroke PA 1242 hours. Electronically Signed   By: Nelson Chimes M.D.   On: 07/26/2020 12:48   CT ANGIO NECK CODE STROKE  Result Date: 07/26/2020 CLINICAL DATA:  Acute presentation with left body weakness and speech disturbance. EXAM: CT ANGIOGRAPHY HEAD AND NECK CT PERFUSION BRAIN TECHNIQUE: Multidetector CT imaging of the head and neck was performed using the standard protocol  during bolus administration of intravenous contrast. Multiplanar CT image reconstructions and MIPs were obtained to evaluate the vascular anatomy. Carotid stenosis measurements (when applicable) are obtained utilizing NASCET criteria, using the distal internal carotid diameter as the denominator. Multiphase CT imaging of the brain was performed following IV bolus contrast injection. Subsequent parametric perfusion maps were calculated using RAPID software. CONTRAST:  Not annotated COMPARISON:  Head CT earlier same day FINDINGS: CTA NECK FINDINGS Aortic arch: No aortic atherosclerotic calcification seen. No aneurysm or dissection. Branching pattern is normal with wide patency of the origins. Right carotid system: Common carotid artery widely patent to the bifurcation. No atherosclerotic disease at the carotid bifurcation. ICA widely patent. Left carotid system: Common carotid artery widely patent to the bifurcation. Carotid bifurcation is normal without soft or calcified plaque. ICA widely patent. Vertebral arteries: Both vertebral artery origins are widely patent. Both vertebral arteries are normal through the cervical region to the foramen magnum. Skeleton: Ordinary cervical spondylosis. Other neck: No mass or lymphadenopathy. Upper chest: No active chest disease. 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 Review of the MIP images confirms the above findings CTA HEAD FINDINGS Anterior circulation: Complete occlusion of the right carotid terminus. Minimal flow visible in the right MCA territory. Right ACA receives it supply through the anterior communicating route. No left stenosis or occlusion. Right PCA arises from the carotid artery immediately proximal to the occlusion. Posterior circulation: Both vertebral arteries patent to the basilar. Atherosclerotic narrowing the distal vertebral arteries and of the basilar. Posterior circulation branch  vessels are patent. Right PCA receives most of it supply from the anterior circulation. Venous sinuses: Patent and normal. Anatomic variants: None significant. 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 Minimal flow seen in the right MCA territory. Right ACA territory appears normally perfused, probably due to a patent anterior communicating artery. 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 Large penumbra, 136 cc in the remainder of the right MCA territory. No aortic atherosclerotic disease seen. No atherosclerotic disease at either carotid bifurcation. Some atherosclerotic narrowing of the distal vertebral arteries and basilar artery. Preliminary report sent by text at 1238 hours. Discussed with stroke PA 1242 hours. Electronically Signed   By: MarNelson ChimesD.   On: 07/26/2020 12:48   IR ANGIO VERTEBRAL SEL SUBCLAVIAN INNOMINATE UNI R MOD SED  Result Date: 07/29/2020 INDICATION: Acute onset of left-sided hemiplegia, right gaze deviation and slurred speech.  Occluded right internal carotid artery supraclinoid segment, right middle cerebral artery and the right anterior cerebral artery.  EXAM: 1. EMERGENT LARGE VESSEL OCCLUSION THROMBOLYSIS (anterior CIRCULATION)  COMPARISON:  CT angiogram of the head and neck of July 26, 2020.  MEDICATIONS: Ancef 2 g IV antibiotic was administered within 1 hour of  the procedure.  ANESTHESIA/SEDATION: General anesthesia  CONTRAST:  Isovue 300 approximately 100 mL  FLUOROSCOPY TIME:  Fluoroscopy Time: 38 minutes 36 seconds (1410 mGy).  COMPLICATIONS: None immediate.  TECHNIQUE: Following a full explanation of the procedure along with the potential associated complications, an informed witnessed consent was obtained the patient's  spouse via an interpreter. The risks of intracranial hemorrhage of 10%, worsening neurological deficit, ventilator dependency, death and inability to revascularize were all reviewed in detail with the patient's spouse.  The patient was then put under general anesthesia by the Department of Anesthesiology at [facility].  The right groin was prepped and draped in the usual sterile fashion. Thereafter using modified Seldinger technique, transfemoral access into the right common femoral artery was obtained without difficulty. Over a 0.035 inch guidewire a 5 French Pinnacle sheath was inserted. Through this, and also over a 0.035 inch guidewire a 8 French 25 cm catheter was advanced to the aortic arch region and selectively positioned in the innominate artery and right common carotid artery.  FINDINGS: Innominate arteriogram demonstrates the origin of the right subclavian artery and the right common carotid artery to be widely patent.  Moderate tortuosity is seen of the right common carotid artery proximally.  The right vertebral artery origin is widely patent.  The vessel is seen to opacify to the cranial skull base. The opacified portion of the basilar artery on the AP projection demonstrates patency with suggestion of a 50% stenosis of the right vertebrobasilar junction.  The right common carotid arteriogram demonstrates the right external carotid artery and its major branches to be widely patent.  The right internal carotid artery at the bulb to the cranial skull base is patent with moderate severe tortuosity involving the mid right M1 segment associated with a 50% stenosis.  The petrous and proximal cavernous segments are widely patent.  There is complete angiographic occlusion of the right internal carotid artery just distal to the origin of the right posterior communicating artery supplying the right posterior cerebral artery distribution.  PROCEDURE: The diagnostic JB 1 catheter in the right  common carotid artery was exchanged over a 0.035 inch 300 cm Rosen exchange guidewire for an 087 95 cm balloon guide catheter which had been prepped with 50% contrast and 50% heparinized saline infusion.  Guidewire was removed. Good aspiration obtained from the hub of the balloon guide catheter. A gentle contrast injection demonstrated no evidence of vasospasm, or of intraluminal filling defects.  Over a 0.014 inch standard Synchro micro guidewire, an 027 160 cm Marksman microcatheter inside of an 071 135 cm Zoom aspiration catheter was advanced without difficulty to the distal cavernous segment of the right internal carotid artery.  The micro guidewire was then gently manipulated without difficulty and advanced into the inferior division of the right middle cerebral artery M2 M3 region followed by the microcatheter. The guidewire was removed. Mild resistance was again to aspiration which cleared with more proximal position of the microcatheter. A gentle control arteriogram performed through the microcatheter demonstrated safe position of tip of the microcatheter, which was connected to continuous heparinized saline infusion.  A 3 mm x 20 mm Solitaire X retrieval device was then advanced to the distal end of the microcatheter. The O ring on the delivery microcatheter was loosened. With slight forward gentle traction with the right hand on the delivery micro guidewire with the left hand the delivery microcatheter was retrieved unsheathing the retrieval device.  The 071 aspiration catheter was engaged in the occluded  right middle cerebral artery M1 segment. Continued aspiration was applied using an aspiration device with proximal flow arrest in the right internal carotid artery, and aspiration using a 60 mL syringe at the hub of the balloon guide catheter for approximately 2-1/2 minutes.  Thereafter, the combination of the retrieval device, the microcatheter, aspiration catheter was retrieved and removed as  aspiration was applied at the hub of the balloon guide catheter. Large clot and small pieces were seen from within the aspiration catheter.  A control arteriogram performed through the balloon guide following reversal of flow arrest in the right internal carotid artery demonstrated wide patency of the superior and inferior divisions of the right middle cerebral artery. A control arteriogram performed following infusion of 25 mcg of nitroglycerin intra-arterially now demonstrated significantly improved caliber and flow through the superior and inferior divisions. There continued be slow flow involving the M3 M4 branches of the anterior of peri insular branches consistent with the site of the core seen on the CT perfusion studies.  A TICI 2C revascularization of the right MCA distribution had been achieved. The right anterior cerebral artery was now also widely patent with a TICI 3 revascularization.  Patency of the right posterior communicating artery and the right posterior cerebral artery territory was intact.  Control arteriogram performed through the balloon guide in the right common carotid artery demonstrated free flow in the right internal carotid artery extra cranially and intracranially.  This was then removed. The 8 French Pinnacle sheath in the right groin was replaced with an 8 French Angio-Seal closure device for hemostasis. The distal pulses remained Dopplerable in both the DPs and posterior tibial arteries bilaterally unchanged.  Post procedure CT scan of the brain demonstrated contrast blush in the right basal ganglia region. No gross mass effect or midline shift was noted.  The patient was left intubated on account of the patient's neurologic condition prior to intubation. She was then transported to the neuro ICU for post thrombectomy care.  IMPRESSION: Status post endovascular revascularization of occluded right internal carotid artery supraclinoid segment, the right middle cerebral and the  right anterior cerebral artery with 1 pass with a 3 mm x 20 mm Solitaire X retrieval device, contact aspiration and proximal flow arrest achieving a TICI 2C revascularization.  PLAN: Follow-up as per referring MD.   Electronically Signed   By: Luanne Bras M.D.   On: 07/27/2020 14:02  8/1 revascularization of right ACA by IR 8/24 PEG tube placement   Subjective: NO NEW COMPLAINTS.  Discharge Exam: Vitals:   08/23/20 0335 08/23/20 0808  BP: 122/84 (!) 141/92  Pulse: 92 91  Resp:    Temp: 98.2 F (36.8 C) 98.1 F (36.7 C)  SpO2: 100% 98%   Vitals:   08/22/20 2048 08/23/20 0335 08/23/20 0500 08/23/20 0808  BP: 133/73 122/84  (!) 141/92  Pulse: 93 92  91  Resp:      Temp: 98.1 F (36.7 C) 98.2 F (36.8 C)  98.1 F (36.7 C)  TempSrc: Oral Oral  Oral  SpO2: 100% 100%  98%  Weight:   64.4 kg   Height:        General: Pt is alert, awake, not in acute distress Cardiovascular: RRR, S1/S2 +, no rubs, no gallops Respiratory: CTA bilaterally, no wheezing, no rhonchi Abdominal: Soft, NT, ND, bowel sounds + Extremities: no edema, no cyanosis    The results of significant diagnostics from this hospitalization (including imaging, microbiology, ancillary and laboratory) are listed below for  reference.     Microbiology: Recent Results (from the past 240 hour(s))  Culture, blood (routine x 2)     Status: None   Collection Time: 08/13/20  1:52 PM   Specimen: BLOOD RIGHT HAND  Result Value Ref Range Status   Specimen Description BLOOD RIGHT HAND  Final   Special Requests   Final    BOTTLES DRAWN AEROBIC ONLY Blood Culture adequate volume   Culture   Final    NO GROWTH 5 DAYS Performed at [facility], [address] [place], [place] [address]    Report Status 08/18/2020 FINAL  Final  Culture, blood (routine x 2)     Status: None   Collection Time: 08/13/20  1:58 PM   Specimen: BLOOD RIGHT HAND  Result Value Ref Range Status   Specimen Description BLOOD RIGHT  HAND  Final   Special Requests   Final    BOTTLES DRAWN AEROBIC ONLY Blood Culture adequate volume   Culture   Final    NO GROWTH 5 DAYS Performed at [facility], [place] [address], [place], [place] [address]    Report Status 08/18/2020 FINAL  Final  C Difficile Quick Screen (NO PCR Reflex)     Status: Abnormal   Collection Time: 08/14/20  1:48 AM   Specimen: STOOL  Result Value Ref Range Status   C Diff antigen POSITIVE (A) NEGATIVE Final   C Diff toxin POSITIVE (A) NEGATIVE Final   C Diff interpretation Toxin producing C. difficile detected.  Final    Comment: CRITICAL RESULT CALLED TO, READ BACK BY AND VERIFIED WITH: Lavonia Drafts RN 08/14/20 0501 JDW Performed at [facility], [place] [address] [place], [place] [address]   SARS CORONAVIRUS 2 (TAT 6-24 HRS) Nasopharyngeal Nasopharyngeal Swab     Status: None   Collection Time: 08/21/20  5:00 AM   Specimen: Nasopharyngeal Swab  Result Value Ref Range Status   SARS Coronavirus 2 NEGATIVE NEGATIVE Final    Comment: (NOTE) SARS-CoV-2 target nucleic acids are NOT DETECTED.  The SARS-CoV-2 RNA is generally detectable in upper and lower respiratory specimens during the acute phase of infection. Negative results do not preclude SARS-CoV-2 infection, do not rule out co-infections with other pathogens, and should not be used as the sole basis for treatment or other patient management decisions. Negative results must be combined with clinical observations, patient history, and epidemiological information. The expected result is Negative.  Fact Sheet for Patients: SugarRoll.be  Fact Sheet for Healthcare Providers: https://www.woods-mathews.com/  This test is not yet approved or cleared by the [place] FDA and  has been authorized for detection and/or diagnosis of SARS-CoV-2 by FDA under an Emergency Use Authorization (EUA). This EUA will remain  in effect (meaning this test can be  used) for the duration of the COVID-19 declaration under Se ction 564(b)(1) of the Act, 21 U.S.C. section 360bbb-3(b)(1), unless the authorization is terminated or revoked sooner.  Performed at [facility], [place] [address] [place],  [address]      Labs: BNP (last 3 results) No results for input(s): BNP in the last 8760 hours. Basic Metabolic Panel: Recent Labs  Lab 08/17/20 0220 08/22/20 0145  NA 136 133*  K 4.3 4.7  CL 107 103  CO2 21* 21*  GLUCOSE 89 145*  BUN 31* 28*  CREATININE 1.31* 1.17*  CALCIUM 8.4* 8.4*   Liver Function Tests: No results for input(s): AST, ALT, ALKPHOS, BILITOT, PROT, ALBUMIN in  the last 168 hours. No results for input(s): LIPASE, AMYLASE in the last 168 hours. No results for input(s): AMMONIA in the last 168 hours. CBC: Recent Labs  Lab 08/17/20 0844 08/18/20 0258 08/19/20 0141 08/20/20 0425  WBC 10.1 10.9* 15.2* 10.5  NEUTROABS  --  7.9* 11.9* 7.3  HGB 7.5* 7.9* 8.2* 8.5*  HCT 25.7* 26.6* 27.3* 28.5*  MCV 76.5* 76.7* 77.3* 76.8*  PLT 247 268 309 263   Cardiac Enzymes: No results for input(s): CKTOTAL, CKMB, CKMBINDEX, TROPONINI in the last 168 hours. BNP: Invalid input(s): POCBNP CBG: Recent Labs  Lab 08/22/20 1600 08/22/20 2043 08/22/20 2348 08/23/20 0309 08/23/20 0805  GLUCAP 172* 173* 173* 118* 198*   D-Dimer No results for input(s): DDIMER in the last 72 hours. Hgb A1c No results for input(s): HGBA1C in the last 72 hours. Lipid Profile No results for input(s): CHOL, HDL, LDLCALC, TRIG, CHOLHDL, LDLDIRECT in the last 72 hours. Thyroid function studies No results for input(s): TSH, T4TOTAL, T3FREE, THYROIDAB in the last 72 hours.  Invalid input(s): FREET3 Anemia work up No results for input(s): VITAMINB12, FOLATE, FERRITIN, TIBC, IRON, RETICCTPCT in the last 72 hours. Urinalysis    Component Value Date/Time   COLORURINE YELLOW 08/21/2020 0230   APPEARANCEUR CLEAR 08/21/2020 0230   LABSPEC 1.008  08/21/2020 0230   PHURINE 6.0 08/21/2020 0230   GLUCOSEU NEGATIVE 08/21/2020 0230   HGBUR MODERATE (A) 08/21/2020 0230   BILIRUBINUR NEGATIVE 08/21/2020 0230   KETONESUR NEGATIVE 08/21/2020 0230   PROTEINUR NEGATIVE 08/21/2020 0230   NITRITE NEGATIVE 08/21/2020 0230   LEUKOCYTESUR NEGATIVE 08/21/2020 0230   Sepsis Labs Invalid input(s): PROCALCITONIN,  WBC,  LACTICIDVEN Microbiology Recent Results (from the past 240 hour(s))  Culture, blood (routine x 2)     Status: None   Collection Time: 08/13/20  1:52 PM   Specimen: BLOOD RIGHT HAND  Result Value Ref Range Status   Specimen Description BLOOD RIGHT HAND  Final   Special Requests   Final    BOTTLES DRAWN AEROBIC ONLY Blood Culture adequate volume   Culture   Final    NO GROWTH 5 DAYS Performed at [facility], [address], [place], [place] [address]    Report Status 08/18/2020 FINAL  Final  Culture, blood (routine x 2)     Status: None   Collection Time: 08/13/20  1:58 PM   Specimen: BLOOD RIGHT HAND  Result Value Ref Range Status   Specimen Description BLOOD RIGHT HAND  Final   Special Requests   Final    BOTTLES DRAWN AEROBIC ONLY Blood Culture adequate volume   Culture   Final    NO GROWTH 5 DAYS Performed at [facility], [place] [address] [place], [place] [address]    Report Status 08/18/2020 FINAL  Final  C Difficile Quick Screen (NO PCR Reflex)     Status: Abnormal   Collection Time: 08/14/20  1:48 AM   Specimen: STOOL  Result Value Ref Range Status   C Diff antigen POSITIVE (A) NEGATIVE Final   C Diff toxin POSITIVE (A) NEGATIVE Final   C Diff interpretation Toxin producing C. difficile detected.  Final    Comment: CRITICAL RESULT CALLED TO, READ BACK BY AND VERIFIED WITH: Lavonia Drafts RN 08/14/20 0501 JDW Performed at [facility], [place] [address] [place], [place] [address]   SARS CORONAVIRUS 2 (TAT 6-24 HRS) Nasopharyngeal Nasopharyngeal Swab     Status: None   Collection Time: 08/21/20  5:00 AM   Specimen: Nasopharyngeal Swab  Result Value Ref Range Status   SARS Coronavirus 2 NEGATIVE NEGATIVE Final    Comment: (NOTE) SARS-CoV-2 target nucleic acids are NOT DETECTED.  The SARS-CoV-2 RNA is generally detectable in upper and lower respiratory specimens during the acute phase of infection. Negative results do not preclude SARS-CoV-2 infection, do not rule out co-infections with other pathogens, and should not be used as the sole basis for treatment or other patient management decisions. Negative results must be combined with clinical observations, patient history, and epidemiological information. The expected result is Negative.  Fact Sheet for Patients: SugarRoll.be  Fact Sheet for Healthcare Providers: https://www.woods-mathews.com/  This test is not yet approved or cleared by the [place] FDA and  has been authorized for detection and/or diagnosis of SARS-CoV-2 by FDA under an Emergency Use Authorization (EUA). This EUA will remain  in effect (meaning this test can be used) for the duration of the COVID-19 declaration under Se ction 564(b)(1) of the Act, 21 U.S.C. section 360bbb-3(b)(1), unless the authorization is terminated or revoked sooner.  Performed at [facility], [place] [address], [place], [place] [address]      Time coordinating discharge: 53 MINUTES   SIGNED:   Hosie Poisson, MD  [facility] 08/23/2020, 10:58 AM

## 2020-08-23 NOTE — Progress Notes (Signed)
Gave report to Danella Penton at [facility]. PTAR to pick up patient for transport

## 2020-08-23 NOTE — TOC Transition Note (Signed)
Transition of Care [facility]) - CM/SW Discharge Note   Patient Details  Name: Samantha Paul MRN: [id number] Date of Birth: May 08, 1946  [facility] of Care [facility]) CM/SW Contact:  Lavon Paganini Phone Number: 08/23/2020, 11:25 AM   Clinical Narrative:    Patient will DC to: Producer, television/film/video by: Sharin Mons  RN, patient, patient's family, and facility notified of DC. Discharge Summary and FL2 sent to facility. RN to call report prior to discharge [phone] Rm 115). DC packet on chart.   CSW will sign off for now as social work intervention is no longer needed. Please consult [place] again if new needs arise.    Final next level of care: Skilled Nursing Facility Barriers to Discharge: No Barriers Identified   Patient Goals and CMS Choice   CMS Medicare.gov Compare Post Acute Care list provided to:: Patient Represenative (must comment) Ansel Bong Rmah) Choice offered to / list presented to : Spouse  Discharge Placement              Patient chooses bed at: [facility] Patient to be transferred to facility by: PTAR Name of family member notified: Buddy Duty Patient and family notified of of transfer: 08/23/20  Discharge Plan and Services In-house Referral: Clinical Social Work Discharge Planning Services: CM Consult Post Acute Care Choice: Skilled Nursing Facility                               Social Determinants of Health (SDOH) Interventions     Readmission Risk Interventions Readmission Risk Prevention Plan 07/03/2020 07/03/2020  Post Dischage Appt Complete -  Medication Screening Complete Complete  Transportation Screening Complete Complete

## 2020-08-23 NOTE — Social Work (Addendum)
3:30p CSW confirmed with Juliann Pulse that an interpreter will be available. CSW updated patient's spouse. PTAR called.  1:30p CSW met with patient and spouse bedside. CSW spoke with interpreter in spouse in reference to patient's discharge. Patient inquired on whether interpreter will be available at the SNF today. CSW contacted Juliann Pulse with [facility] who agreed to verify and follow-up with CSW.  Darra Lis, [place] Social Worker

## 2020-08-23 NOTE — Plan of Care (Signed)
Adequate for discharge.

## 2020-08-23 NOTE — Progress Notes (Signed)
Attempted report x 1 to [facility]

## 2020-08-23 NOTE — TOC Progression Note (Signed)
Transition of Care [facility]) - Progression Note    Patient Details  Name: Samantha Paul MRN: [id number] Date of Birth: 1946-03-30  Transition of Care [facility]) CM/SW Contact  Cleda Clarks, [place] Phone Number: 08/23/2020, 9:16 AM  Clinical Narrative:     9:15a CSW spoke with Olegario Messier of [facility] to confirm patient's discharge today. Olegario Messier speaking with admission and will get back to CSW.  Expected Discharge Plan: Skilled Nursing Facility Barriers to Discharge: Continued Medical Work up  Expected Discharge Plan and Services Expected Discharge Plan: Skilled Nursing Facility In-house Referral: Clinical Social Work Discharge Planning Services: CM Consult Post Acute Care Choice: Skilled Nursing Facility Living arrangements for the past 2 months: Single Family Home                                       Social Determinants of Health (SDOH) Interventions    Readmission Risk Interventions Readmission Risk Prevention Plan 07/03/2020 07/03/2020  Post Dischage Appt Complete -  Medication Screening Complete Complete  Transportation Screening Complete Complete

## 2020-08-23 NOTE — Progress Notes (Signed)
Patient D/C with foley to facility per MD order, d/c instructions

## 2020-09-11 ENCOUNTER — Other Ambulatory Visit: Payer: Self-pay

## 2020-09-11 ENCOUNTER — Non-Acute Institutional Stay: Payer: Medicare Other | Admitting: Hospice

## 2020-09-11 DIAGNOSIS — I63011 Cerebral infarction due to thrombosis of right vertebral artery: Secondary | ICD-10-CM

## 2020-09-11 DIAGNOSIS — Z515 Encounter for palliative care: Secondary | ICD-10-CM

## 2020-09-11 NOTE — Progress Notes (Signed)
[facility] Consult Note Telephone: [phone]  Fax: [phone]  PATIENT NAME: Samantha Paul [address] [place] [address] [phone] (home)  DOB: 03/17/1946 MRN: [id number]  PRIMARY CARE PROVIDER:    Elwyn Reach, MD,  [place]. [place] [address] [phone]  REFERRING PROVIDER:   Elwyn Reach, MD [address] [place],  [place] [address] [phone]  RESPONSIBLE PARTY:   Extended Emergency Contact Information Primary Emergency Contact: Rmah,Yyel Home Phone: [phone] Mobile Phone: [phone] Relation: Spouse Secondary Emergency Contact: [place] Home Phone: [phone] Mobile Phone: [phone] Relation: Son  I met face to face with patient in the facility.  RECOMMENDATIONS/PLAN:    Visit at the request of Martinique Blattenberger NP for palliative consult. Visit consisted of building trust and discussions on Palliative Medicine as specialized medical care for people living with serious illness, aimed at facilitating better quality of life through symptoms relief, assisting with advance care plan and establishing goals of care. Patient is lethargic through visit and not able to participate in discussion.  NP called spouse and left him a voicemail with callback number.  CODE STATUS: Facility records and chart review in epic indicate that patient is a FULL CODE  GOALS OF CARE: Goals of care include to maximize quality of life and symptom management.  Follow up Palliative Care Visit: Palliative care will continue to follow for goals of care clarification and symptom management.  Follow-up in 1 to 2 months.  Functional decline/Symptom Management: Patient in SNF for rehab following recent hospitalization last month 8/1 - 8/29 for CVA. Epic chart review indicates CTA head/neck showed embolic occlusion of the right carotid with minimal flow seen in the right MCA territory with large penumbra  remainder of the right MCA territory. Patient was in neuro ICU and underwent TPA thrombectomy; she was intubated for revascularization procedure; she required prolonged mechanical ventilation due to poor mentation before successful extubation on 8/14. Today, patient still experiecing lethargy post CVA, currently on neurostimulant Methylphenidate.  Patient currently on Levaquin via [place] for pneumonia, end date 09/16/20.  Patient in no acute distress, FLACC 0, bedbound, n.p.o., supplemental feeding via PEG tube nursing staff with no complaint at this time. Palliative will continue to monitor for symptom management/decline and make recommendations as needed.  I spent one hour and 20 minutes providing this initial consultation; time includes time spent with patient/family, chart review, provider coordination,  and documentation. More than 50% of the time in this consultation was spent on coordinating communication  CHIEF COMPLAIN/HISTORY OF PRESENT ILLNESS:  Samantha Paul is a 74 y.o. female with multiple medical problems including recent CVA, dysphagia, left hemiplegia/hemiparesis, type 2 diabetes mellitus, history of A. fib. Palliative Care was asked to follow this patient by consultation request of Martinique Blattenberger NP  to help address advance care planning and goals of care.  CODE STATUS: Full  PPS: 20%  HOSPICE ELIGIBILITY/DIAGNOSIS: TBD  PAST MEDICAL HISTORY:  Past Medical History:  Diagnosis Date  . Afib ([facility]) 06/2020   Diagnosed 06/2020, on cardizem, metoprolol, eliquis  . Back pain   . CVA (cerebral vascular accident) ([facility]) 07/26/2020   R MCA/ACA CVA  . DM II (diabetes mellitus, type II), controlled ([facility])   . HTN (hypertension)   . Knee pain   . Stroke [facility]) ?   residual mild right sided weakness     SOCIAL HX:  Social History   Tobacco Use  . Smoking status: Never Smoker  .  Smokeless tobacco: Never Used  Substance Use Topics  . Alcohol use: Not Currently   FAMILY HX:    Family History  Problem Relation Age of Onset  . Arthritis Brother     ALLERGIES:  Allergies  Allergen Reactions  . Metformin And Related Diarrhea    Abdominal pain and diarrhea     PERTINENT MEDICATIONS:  Outpatient Encounter Medications as of 09/11/2020  Medication Sig  . acetaminophen (TYLENOL) 160 MG/5ML solution Place 20.3 mLs (650 mg total) into feeding tube every 4 (four) hours as needed for mild pain (or temp > 37.5 C (99.5 F)).  Marland Kitchen apixaban (ELIQUIS) 5 MG TABS tablet Take 1 tablet (5 mg total) by mouth 2 (two) times daily.  . bethanechol (URECHOLINE) 10 MG tablet Place 1 tablet (10 mg total) into feeding tube 3 (three) times daily.  . blood glucose meter kit and supplies KIT Dispense based on patient and insurance preference. Use up to four times daily as directed. (FOR ICD-10--E11.9).  Marland Kitchen diltiazem (CARDIZEM) 10 mg/ml oral suspension Place 6 mLs (60 mg total) into feeding tube every 6 (six) hours.  . ferrous sulfate 300 (60 Fe) MG/5ML syrup Place 5 mLs (300 mg total) into feeding tube 3 (three) times daily.  . insulin aspart (NOVOLOG) 100 UNIT/ML injection CBG 70 - 120: 0 units CBG 121 - 150: 1 unit CBG 151 - 200: 2 units CBG 201 - 250: 3 units CBG 251 - 300: 5 units CBG 301 - 350: 7 units CBG 351 - 400: 9 units  . insulin glargine (LANTUS) 100 UNIT/ML injection Inject 0.22 mLs (22 Units total) into the skin daily.  . Insulin Pen Needle (PEN NEEDLES) 32G X 4 MM MISC Use as directed with insulin pen  . metFORMIN (GLUCOPHAGE) 500 MG tablet Take 1 tablet (500 mg total) by mouth 2 (two) times daily with a meal.  . metoprolol tartrate (LOPRESSOR) 50 MG tablet Place 1 tablet (50 mg total) into feeding tube 2 (two) times daily.  . metroNIDAZOLE (FLAGYL) 50 mg/ml oral suspension Place 10 mLs (500 mg total) into feeding tube 3 (three) times daily. Take until 08/28/20  . Nutritional Supplements (FEEDING SUPPLEMENT, JEVITY 1.2 CAL,) LIQD Place 1,000 mLs into feeding tube continuous.  .  Nutritional Supplements (FEEDING SUPPLEMENT, PROSOURCE TF,) liquid Place 45 mLs into feeding tube daily.  . rosuvastatin (CRESTOR) 20 MG tablet Place 1 tablet (20 mg total) into feeding tube daily.  Marland Kitchen senna-docusate (SENOKOT-S) 8.6-50 MG tablet Take 2 tablets by mouth at bedtime.  . vancomycin (VANCOCIN) 50 mg/mL oral solution Place 2.5 mLs (125 mg total) into feeding tube every 6 (six) hours. Take until 08/28/20  . Water For Irrigation, Sterile (FREE WATER) SOLN Place 100 mLs into feeding tube every 8 (eight) hours.   No facility-administered encounter medications on file as of 09/11/2020.    PHYSICAL EXAM / ROS  General: NAD Cardiovascular: regular rate and rhythm, no chest pain reported Pulmonary: rhonchous breath sounds on all lung fields;  on oxygen supplementation Abdomen: soft, non tender, positive bowel sounds in all quadrants; Peg tube in place for Jevity 1.2 running 32m/hr GU:  no suprapubic tenderness, foley cath in place, patent, clear light yellow urine Extremities: mild non pitting edema to bil ankle Skin: no rashes to exposed skin Neurological: Weakness but otherwise non focal; lethargic  LTeodoro Spray NP

## 2020-09-18 ENCOUNTER — Other Ambulatory Visit: Payer: Self-pay

## 2020-09-18 ENCOUNTER — Encounter (HOSPITAL_COMMUNITY): Payer: Self-pay

## 2020-09-18 ENCOUNTER — Inpatient Hospital Stay (HOSPITAL_COMMUNITY)
Admission: EM | Admit: 2020-09-18 | Discharge: 2020-09-22 | DRG: 698 | Disposition: A | Discharge status: Skilled Nursing Facility | Payer: Medicare Other | Source: Skilled Nursing Facility | Attending: Internal Medicine | Admitting: Internal Medicine

## 2020-09-18 ENCOUNTER — Emergency Department (HOSPITAL_COMMUNITY): Payer: Medicare Other

## 2020-09-18 DIAGNOSIS — Z8261 Family history of arthritis: Secondary | ICD-10-CM | POA: Diagnosis not present

## 2020-09-18 DIAGNOSIS — I69354 Hemiplegia and hemiparesis following cerebral infarction affecting left non-dominant side: Secondary | ICD-10-CM | POA: Diagnosis not present

## 2020-09-18 DIAGNOSIS — Z79899 Other long term (current) drug therapy: Secondary | ICD-10-CM

## 2020-09-18 DIAGNOSIS — I4891 Unspecified atrial fibrillation: Secondary | ICD-10-CM

## 2020-09-18 DIAGNOSIS — R7401 Elevation of levels of liver transaminase levels: Secondary | ICD-10-CM | POA: Diagnosis present

## 2020-09-18 DIAGNOSIS — Z9049 Acquired absence of other specified parts of digestive tract: Secondary | ICD-10-CM

## 2020-09-18 DIAGNOSIS — I69320 Aphasia following cerebral infarction: Secondary | ICD-10-CM

## 2020-09-18 DIAGNOSIS — Z7901 Long term (current) use of anticoagulants: Secondary | ICD-10-CM | POA: Diagnosis not present

## 2020-09-18 DIAGNOSIS — Z20822 Contact with and (suspected) exposure to covid-19: Secondary | ICD-10-CM | POA: Diagnosis present

## 2020-09-18 DIAGNOSIS — R6520 Severe sepsis without septic shock: Secondary | ICD-10-CM | POA: Diagnosis present

## 2020-09-18 DIAGNOSIS — E875 Hyperkalemia: Secondary | ICD-10-CM | POA: Diagnosis present

## 2020-09-18 DIAGNOSIS — A419 Sepsis, unspecified organism: Secondary | ICD-10-CM | POA: Diagnosis present

## 2020-09-18 DIAGNOSIS — I5032 Chronic diastolic (congestive) heart failure: Secondary | ICD-10-CM | POA: Diagnosis present

## 2020-09-18 DIAGNOSIS — Z888 Allergy status to other drugs, medicaments and biological substances status: Secondary | ICD-10-CM | POA: Diagnosis not present

## 2020-09-18 DIAGNOSIS — E872 Acidosis: Secondary | ICD-10-CM | POA: Diagnosis present

## 2020-09-18 DIAGNOSIS — T83511A Infection and inflammatory reaction due to indwelling urethral catheter, initial encounter: Principal | ICD-10-CM | POA: Diagnosis present

## 2020-09-18 DIAGNOSIS — G92 Toxic encephalopathy: Secondary | ICD-10-CM | POA: Diagnosis present

## 2020-09-18 DIAGNOSIS — E1165 Type 2 diabetes mellitus with hyperglycemia: Secondary | ICD-10-CM | POA: Diagnosis present

## 2020-09-18 DIAGNOSIS — Z794 Long term (current) use of insulin: Secondary | ICD-10-CM

## 2020-09-18 DIAGNOSIS — R1310 Dysphagia, unspecified: Secondary | ICD-10-CM | POA: Diagnosis present

## 2020-09-18 DIAGNOSIS — I110 Hypertensive heart disease with heart failure: Secondary | ICD-10-CM | POA: Diagnosis present

## 2020-09-18 DIAGNOSIS — I482 Chronic atrial fibrillation, unspecified: Secondary | ICD-10-CM | POA: Diagnosis present

## 2020-09-18 DIAGNOSIS — N390 Urinary tract infection, site not specified: Secondary | ICD-10-CM | POA: Diagnosis present

## 2020-09-18 DIAGNOSIS — D509 Iron deficiency anemia, unspecified: Secondary | ICD-10-CM | POA: Diagnosis present

## 2020-09-18 DIAGNOSIS — J9601 Acute respiratory failure with hypoxia: Secondary | ICD-10-CM | POA: Diagnosis present

## 2020-09-18 DIAGNOSIS — Z6822 Body mass index (BMI) 22.0-22.9, adult: Secondary | ICD-10-CM

## 2020-09-18 DIAGNOSIS — R000 Tachycardia, unspecified: Secondary | ICD-10-CM | POA: Diagnosis not present

## 2020-09-18 DIAGNOSIS — E46 Unspecified protein-calorie malnutrition: Secondary | ICD-10-CM | POA: Diagnosis present

## 2020-09-18 DIAGNOSIS — E785 Hyperlipidemia, unspecified: Secondary | ICD-10-CM | POA: Diagnosis present

## 2020-09-18 LAB — COMPREHENSIVE METABOLIC PANEL
ALT: 40 U/L (ref 0–44)
AST: 54 U/L — ABNORMAL HIGH (ref 15–41)
Albumin: 1.8 g/dL — ABNORMAL LOW (ref 3.5–5.0)
Alkaline Phosphatase: 54 U/L (ref 38–126)
Anion gap: 8 (ref 5–15)
BUN: 36 mg/dL — ABNORMAL HIGH (ref 8–23)
CO2: 25 mmol/L (ref 22–32)
Calcium: 7.9 mg/dL — ABNORMAL LOW (ref 8.9–10.3)
Chloride: 106 mmol/L (ref 98–111)
Creatinine, Ser: 1.06 mg/dL — ABNORMAL HIGH (ref 0.44–1.00)
GFR calc Af Amer: >60 mL/min (ref >60)
GFR calc non Af Amer: 52 mL/min — ABNORMAL LOW (ref >60)
Glucose, Bld: 212 mg/dL — ABNORMAL HIGH (ref 70–99)
Potassium: 5.3 mmol/L — ABNORMAL HIGH (ref 3.5–5.1)
Sodium: 139 mmol/L (ref 135–145)
Total Bilirubin: 0.8 mg/dL (ref 0.3–1.2)
Total Protein: 5.5 g/dL — ABNORMAL LOW (ref 6.5–8.1)

## 2020-09-18 LAB — CBC WITH DIFFERENTIAL/PLATELET
Abs Immature Granulocytes: 0.06 10*3/uL (ref 0.00–0.07)
Basophils Absolute: 0.1 10*3/uL (ref 0.0–0.1)
Basophils Relative: 1 %
Eosinophils Absolute: 0 10*3/uL (ref 0.0–0.5)
Eosinophils Relative: 0 %
HCT: 36.5 % (ref 36.0–46.0)
Hemoglobin: 10.4 g/dL — ABNORMAL LOW (ref 12.0–15.0)
Immature Granulocytes: 1 %
Lymphocytes Relative: 15 %
Lymphs Abs: 1.9 10*3/uL (ref 0.7–4.0)
MCH: 23.6 pg — ABNORMAL LOW (ref 26.0–34.0)
MCHC: 28.5 g/dL — ABNORMAL LOW (ref 30.0–36.0)
MCV: 83 fL (ref 80.0–100.0)
Monocytes Absolute: 1 10*3/uL (ref 0.1–1.0)
Monocytes Relative: 8 %
Neutro Abs: 9.8 10*3/uL — ABNORMAL HIGH (ref 1.7–7.7)
Neutrophils Relative %: 75 %
Platelets: 189 10*3/uL (ref 150–400)
RBC: 4.4 MIL/uL (ref 3.87–5.11)
RDW: 18.7 % — ABNORMAL HIGH (ref 11.5–15.5)
WBC: 12.8 10*3/uL — ABNORMAL HIGH (ref 4.0–10.5)
nRBC: 0.2 % (ref 0.0–0.2)

## 2020-09-18 LAB — PROTIME-INR
INR: 1.4 — ABNORMAL HIGH (ref 0.8–1.2)
Prothrombin Time: 16.7 seconds — ABNORMAL HIGH (ref 11.4–15.2)

## 2020-09-18 LAB — LACTIC ACID, PLASMA
Lactic Acid, Venous: 2.8 mmol/L (ref 0.5–1.9)
Lactic Acid, Venous: 2.2 mmol/L (ref 0.5–1.9)

## 2020-09-18 LAB — URINALYSIS, ROUTINE W REFLEX MICROSCOPIC
Bilirubin Urine: NEGATIVE
Glucose, UA: 50 mg/dL — AB
Ketones, ur: NEGATIVE mg/dL
Nitrite: NEGATIVE
Protein, ur: 100 mg/dL — AB
RBC / HPF: >50 RBC/hpf — ABNORMAL HIGH (ref 0–5)
Specific Gravity, Urine: 1.018 (ref 1.005–1.030)
pH: 6 (ref 5.0–8.0)

## 2020-09-18 LAB — CBG MONITORING, ED: Glucose-Capillary: 220 mg/dL — ABNORMAL HIGH (ref 70–99)

## 2020-09-18 LAB — RESPIRATORY PANEL BY RT PCR (FLU A&B, COVID)
Influenza A by PCR: NEGATIVE
Influenza B by PCR: NEGATIVE
SARS Coronavirus 2 by RT PCR: NEGATIVE

## 2020-09-18 LAB — APTT: aPTT: 27 seconds (ref 24–36)

## 2020-09-18 MED ORDER — METRONIDAZOLE IN NACL 5-0.79 MG/ML-% IV SOLN
500.0000 mg | Freq: Three times a day (TID) | INTRAVENOUS | Status: DC
  Administered 2020-09-19: 500 mg via INTRAVENOUS
  Filled 2020-09-18: qty 100

## 2020-09-18 MED ORDER — SODIUM CHLORIDE 0.9% FLUSH: 10.0000 mL | INTRAVENOUS | Status: DC | PRN

## 2020-09-18 MED ORDER — SODIUM CHLORIDE 0.9% FLUSH
10.0000 mL | Freq: Two times a day (BID) | INTRAVENOUS | Status: DC
  Administered 2020-09-19 – 2020-09-22 (×7): 10 mL

## 2020-09-18 MED ORDER — VANCOMYCIN HCL 1250 MG/250ML IV SOLN
1250.0000 mg | Freq: Once | INTRAVENOUS | Status: AC
  Administered 2020-09-18: 1250 mg via INTRAVENOUS
  Filled 2020-09-18: qty 250

## 2020-09-18 MED ORDER — SODIUM CHLORIDE 0.9 % IV SOLN
2.0000 g | Freq: Two times a day (BID) | INTRAVENOUS | Status: DC
  Administered 2020-09-19 – 2020-09-21 (×5): 2 g via INTRAVENOUS
  Filled 2020-09-18 (×6): qty 2

## 2020-09-18 MED ORDER — SODIUM CHLORIDE 0.9 % IV SOLN: 2.0000 g | Freq: Two times a day (BID) | INTRAVENOUS | Status: DC

## 2020-09-18 MED ORDER — LACTATED RINGERS IV BOLUS (SEPSIS)
1000.0000 mL | Freq: Once | INTRAVENOUS | Status: AC
  Administered 2020-09-18: 1000 mL via INTRAVENOUS

## 2020-09-18 MED ORDER — METRONIDAZOLE IN NACL 5-0.79 MG/ML-% IV SOLN
500.0000 mg | Freq: Once | INTRAVENOUS | Status: AC
  Administered 2020-09-18: 500 mg via INTRAVENOUS
  Filled 2020-09-18: qty 100

## 2020-09-18 MED ORDER — AMIODARONE HCL IN DEXTROSE 360-4.14 MG/200ML-% IV SOLN
30.0000 mg/h | INTRAVENOUS | Status: DC
  Administered 2020-09-19: 30 mg/h via INTRAVENOUS
  Filled 2020-09-18 (×2): qty 200

## 2020-09-18 MED ORDER — VANCOMYCIN HCL IN DEXTROSE 1-5 GM/200ML-% IV SOLN: 1000.0000 mg | Freq: Once | INTRAVENOUS | Status: DC

## 2020-09-18 MED ORDER — SODIUM CHLORIDE 0.9 % IV SOLN
2.0000 g | Freq: Once | INTRAVENOUS | Status: AC
  Administered 2020-09-18: 2 g via INTRAVENOUS
  Filled 2020-09-18: qty 2

## 2020-09-18 MED ORDER — METOPROLOL TARTRATE 5 MG/5ML IV SOLN
5.0000 mg | Freq: Once | INTRAVENOUS | Status: AC
  Administered 2020-09-18: 5 mg via INTRAVENOUS
  Filled 2020-09-18: qty 5

## 2020-09-18 MED ORDER — AMIODARONE HCL IN DEXTROSE 360-4.14 MG/200ML-% IV SOLN
60.0000 mg/h | INTRAVENOUS | Status: AC
  Administered 2020-09-18 – 2020-09-19 (×2): 60 mg/h via INTRAVENOUS
  Filled 2020-09-18: qty 200

## 2020-09-18 MED ORDER — METOPROLOL TARTRATE 5 MG/5ML IV SOLN
2.5000 mg | Freq: Once | INTRAVENOUS | Status: AC
  Administered 2020-09-18: 2.5 mg via INTRAVENOUS
  Filled 2020-09-18: qty 5

## 2020-09-18 MED ORDER — VANCOMYCIN HCL 750 MG/150ML IV SOLN: 750.0000 mg | INTRAVENOUS | Status: DC

## 2020-09-18 MED ORDER — LACTATED RINGERS IV SOLN: INTRAVENOUS | Status: AC

## 2020-09-18 MED ORDER — ACETAMINOPHEN 650 MG RE SUPP
650.0000 mg | Freq: Once | RECTAL | Status: AC
  Administered 2020-09-18: 650 mg via RECTAL
  Filled 2020-09-18: qty 1

## 2020-09-18 NOTE — ED Provider Notes (Signed)
[facility] EMERGENCY DEPARTMENT Provider Note   CSN: [id number] Arrival date & time: 09/18/20  1725     History Chief Complaint  Patient presents with  . Tachycardia  . Unresponsive    Samantha Paul is a 74 y.o. female.  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  History is limited by language and history of aphasia.  Unable to talk with husband when he was at bedside, did not pick up on calling her interpreter  Per EMS facility had called for tachycardia. Husband had asked him to evaluate her because he thought he she appeared ill. When EMS arrived, she was in atrial fibrillation with RVR with a rate of 190. They found her to have a temperature of 100.7. In route, she had an episode of unresponsiveness or apnea and required bag-valve-mask for approximately 2 minutes and then began breathing spontaneously and was placed on nonrebreather. No report of falls, trauma, medication changes.  After admission spoke with husband who reports she had some nausea, vomiting, increased secretions, and 430 seemed worse, more ill not herself.     Past Medical History:  Diagnosis Date  . Afib (HCC) 06/2020   Diagnosed 06/2020, on cardizem, metoprolol, eliquis  . Back pain   . CVA (cerebral vascular accident) (HCC) 07/26/2020   R MCA/ACA CVA  . DM II (diabetes mellitus, type II), controlled (HCC)   . HTN (hypertension)   . Knee pain   . Stroke (HCC) ?   residual mild right sided weakness     Patient Active Problem List   Diagnosis Date Noted  . Dysphagia due to recent cerebrovascular accident   . Acute  pyelonephritis 08/17/2020  . C. difficile colitis 08/16/2020  . Palliative care by specialist   . DNR (do not resuscitate) discussion   . Acute respiratory failure with hypoxia (HCC)   . Stroke (HCC) 07/26/2020  . Stroke due to embolism (HCC) 07/26/2020  . Middle cerebral artery embolism, right 07/26/2020  . Sepsis (HCC) 06/29/2020  . DKA (diabetic ketoacidoses) (HCC) 06/29/2020  . ARF (acute renal failure) (HCC) 06/29/2020  . Hemiparesis affecting right side as late effect of stroke (HCC)   . Acute blood loss anemia   . Left pontine stroke (HCC) 07/13/2018  . Essential hypertension   . Diabetes mellitus type 2 in nonobese (HCC)   . CVA (cerebral vascular accident) (HCC) 07/10/2018  . Hypertensive urgency 07/10/2018  . Hyperglycemia 07/10/2018  . Weakness generalized 07/10/2018  . Hyperlipidemia     Past Surgical History:  Procedure Laterality Date  . CHOLECYSTECTOMY N/A 06/30/2020   Procedure: LAPAROSCOPIC CHOLECYSTECTOMY;  Surgeon: Thompson, Burke, MD;  Location: [facility] OR;  Service: General;  Laterality: N/A;  . ESOPHAGOGASTRODUODENOSCOPY N/A 08/18/2020   Procedure: ESOPHAGOGASTRODUODENOSCOPY (EGD);  Surgeon: Thompson, Burke, MD;  Location: [facility] ENDOSCOPY;  Service: Endoscopy;  Laterality: N/A;  . IR ANGIO VERTEBRAL SEL SUBCLAVIAN INNOMINATE UNI R MOD SED  07/29/2020  . IR PERCUTANEOUS ART THROMBECTOMY/INFUSION INTRACRANIAL INC DIAG ANGIO  07/26/2020  . PEG PLACEMENT N/A 08/18/2020   Procedure: PERCUTANEOUS ENDOSCOPIC GASTROSTOMY (PEG) PLACEMENT;  Surgeon: Thompson, Burke, MD;  Location: [facility] ENDOSCOPY;  Service: Endoscopy;  Laterality: N/A;  . RADIOLOGY   WITH ANESTHESIA N/A 07/26/2020   Procedure: IR WITH ANESTHESIA;  Surgeon: Radiologist, Medication, MD;  Location: [facility] OR;  Service: Radiology;  Laterality: N/A;     OB History   No obstetric history on file.     Family History  Problem Relation Age of Onset  . Arthritis Brother     Social History   Tobacco Use  . Smoking status:  Never Smoker  . Smokeless tobacco: Never Used  Substance Use Topics  . Alcohol use: Not Currently  . Drug use: Not Currently    Home Medications Prior to Admission medications   Medication Sig Start Date End Date Taking? Authorizing Provider  acetaminophen (TYLENOL) 160 MG/5ML solution Place 20.3 mLs (650 mg total) into feeding tube every 4 (four) hours as needed for mild pain (or temp > 37.5 C (99.5 F)). 08/23/20  Yes Akula, Vijaya, MD  acetaminophen (TYLENOL) 325 MG tablet Place 650 mg into feeding tube every 8 (eight) hours as needed for moderate pain.   Yes [provider]  apixaban (ELIQUIS) 5 MG TABS tablet Take 1 tablet (5 mg total) by mouth 2 (two) times daily. 07/03/20  Yes Lama, Gagan S, MD  bethanechol (URECHOLINE) 10 MG tablet Place 1 tablet (10 mg total) into feeding tube 3 (three) times daily. 08/23/20  Yes Akula, Vijaya, MD  diltiazem (CARDIZEM) 60 MG tablet Place 60 mg into feeding tube 4 (four) times daily.   Yes [provider]  ferrous sulfate 300 (60 Fe) MG/5ML syrup Place 5 mLs (300 mg total) into feeding tube 3 (three) times daily. 08/23/20  Yes Akula, Vijaya, MD  insulin aspart (NOVOLOG) 100 UNIT/ML injection CBG 70 - 120: 0 units CBG 121 - 150: 1 unit CBG 151 - 200: 2 units CBG 201 - 250: 3 units CBG 251 - 300: 5 units CBG 301 - 350: 7 units CBG 351 - 400: 9 units Patient taking differently: Inject 0-9 Units into the skin. CBG 0 - 59 : Notify MD CBG 60 - 150: 0 units CBG 151 - 199: 2 units CBG 200 - 249: 4 units CBG 250 - 299: 6 units CBG 300 - 349: 8 units CBG 350 - 399: 10 units CBG 400-1000 : Notify MD 08/23/20  Yes Akula, Vijaya, MD  insulin glargine (LANTUS) 100 UNIT/ML injection Inject 0.22 mLs (22 Units total) into the skin daily. Patient taking differently: Inject 25 Units into the skin daily.  08/24/20  Yes Akula, Vijaya, MD  metFORMIN (GLUCOPHAGE) 500 MG tablet Take 1 tablet (500 mg total) by mouth 2 (two) times daily with a meal. 07/03/20  07/03/21 Yes Lama, Gagan S, MD  methylphenidate (RITALIN) 5 MG tablet Take 5 mg by mouth 2 (two) times daily.   Yes [provider]  metoprolol tartrate (LOPRESSOR) 50 MG tablet Place 1 tablet (50 mg total) into feeding tube 2 (two) times daily. 08/23/20  Yes Akula, Vijaya, MD  Nutritional Supplements (FEEDING SUPPLEMENT, JEVITY 1.2 CAL,) LIQD Place 1,000 mLs into feeding tube continuous. 08/23/20  Yes Akula, Vijaya, MD  rosuvastatin (CRESTOR) 20 MG tablet Place 1 tablet (20 mg total) into feeding tube daily. 08/24/20  Yes Akula, Vijaya, MD  senna (SENOKOT) 8.6 MG tablet Take 2 tablets by mouth daily.   Yes [provider]  blood glucose meter kit and supplies KIT Dispense based on patient and insurance preference. Use up to four times daily as directed. (FOR ICD-10--E11.9). 07/25/18   Love, Pamela S, PA-C  diltiazem (CARDIZEM) 10 mg/ml oral suspension   Place 6 mLs (60 mg total) into feeding tube every 6 (six) hours. Patient not taking: Reported on 09/18/2020 08/23/20   Akula, Vijaya, MD  Insulin Pen Needle (PEN NEEDLES) 32G X 4 MM MISC Use as directed with insulin pen 07/03/20   Lama, Gagan S, MD  metroNIDAZOLE (FLAGYL) 50 mg/ml oral suspension Place 10 mLs (500 mg total) into feeding tube 3 (three) times daily. Take until 08/28/20 Patient not taking: Reported on 09/18/2020 08/23/20   Akula, Vijaya, MD  Nutritional Supplements (FEEDING SUPPLEMENT, PROSOURCE TF,) liquid Place 45 mLs into feeding tube daily. Patient not taking: Reported on 09/18/2020 08/24/20   Akula, Vijaya, MD  senna-docusate (SENOKOT-S) 8.6-50 MG tablet Take 2 tablets by mouth at bedtime. Patient not taking: Reported on 09/18/2020 07/25/18   Love, Pamela S, PA-C  vancomycin (VANCOCIN) 50 mg/mL oral solution Place 2.5 mLs (125 mg total) into feeding tube every 6 (six) hours. Take until 08/28/20 Patient not taking: Reported on 09/18/2020 08/23/20   Akula, Vijaya, MD  Water For Irrigation, Sterile (FREE WATER) SOLN Place 100 mLs into  feeding tube every 8 (eight) hours. 08/23/20   Akula, Vijaya, MD    Allergies    Metformin and related  Review of Systems   Review of Systems  Unable to perform ROS: Patient nonverbal  Gastrointestinal: Positive for nausea and vomiting.    Physical Exam Updated Vital Signs BP 107/83 (BP Location: Right Arm)   Pulse (!) 150   Temp 100.3 F (37.9 C)   Resp 19   Ht 5' 2" (1.575 m)   Wt 54.8 kg   SpO2 100%   BMI 22.10 kg/m   Physical Exam Vitals and nursing note reviewed.  Constitutional:      General: She is not in acute distress.    Appearance: She is well-developed. She is ill-appearing. She is not diaphoretic.  HENT:     Head: Normocephalic and atraumatic.  Eyes:     Conjunctiva/sclera: Conjunctivae normal.  Cardiovascular:     Rate and Rhythm: Tachycardia present. Rhythm irregular.     Heart sounds: Normal heart sounds. No murmur heard.  No friction rub. No gallop.   Pulmonary:     Effort: Pulmonary effort is normal. No respiratory distress.     Breath sounds: Normal breath sounds. No wheezing or rales.  Abdominal:     General: There is no distension.     Palpations: Abdomen is soft.     Tenderness: There is no abdominal tenderness. There is no guarding.  Musculoskeletal:        General: No tenderness.     Cervical back: Normal range of motion.  Skin:    General: Skin is warm and dry.     Findings: No erythema or rash.  Neurological:     Mental Status: She is alert.     Comments: Moans, left-sided hemiparesis, does follow commands on right side with weakness, left facial droop, left hemineglect and right gaze preference     ED Results / Procedures / Treatments   Labs (all labs ordered are listed, but only abnormal results are displayed) Labs Reviewed  LACTIC ACID, PLASMA - Abnormal; Notable for the following components:      Result Value   Lactic Acid, Venous 2.8 (*)    All other components within normal limits  LACTIC ACID, PLASMA - Abnormal; Notable  for the following components:   Lactic Acid, Venous 2.2 (*)    All other components within normal limits  CBC WITH DIFFERENTIAL/PLATELET -   Abnormal; Notable for the following components:   WBC 12.8 (*)    Hemoglobin 10.4 (*)    MCH 23.6 (*)    MCHC 28.5 (*)    RDW 18.7 (*)    Neutro Abs 9.8 (*)    All other components within normal limits  URINALYSIS, ROUTINE W REFLEX MICROSCOPIC - Abnormal; Notable for the following components:   Color, Urine AMBER (*)    APPearance CLOUDY (*)    Glucose, UA 50 (*)    Hgb urine dipstick LARGE (*)    Protein, ur 100 (*)    Leukocytes,Ua LARGE (*)    RBC / HPF >50 (*)    Bacteria, UA RARE (*)    Non Squamous Epithelial 0-5 (*)    All other components within normal limits  COMPREHENSIVE METABOLIC PANEL - Abnormal; Notable for the following components:   Potassium 5.3 (*)    Glucose, Bld 212 (*)    BUN 36 (*)    Creatinine, Ser 1.06 (*)    Calcium 7.9 (*)    Total Protein 5.5 (*)    Albumin 1.8 (*)    AST 54 (*)    GFR calc non Af Amer 52 (*)    All other components within normal limits  PROTIME-INR - Abnormal; Notable for the following components:   Prothrombin Time 16.7 (*)    INR 1.4 (*)    All other components within normal limits  CBG MONITORING, ED - Abnormal; Notable for the following components:   Glucose-Capillary 220 (*)    All other components within normal limits  RESPIRATORY PANEL BY RT PCR (FLU A&B, COVID)  CULTURE, BLOOD (ROUTINE X 2)  CULTURE, BLOOD (ROUTINE X 2)  URINE CULTURE  APTT    EKG EKG Interpretation  Date/Time:  Friday September 18 2020 17:29:24 EDT Ventricular Rate:  177 PR Interval:    QRS Duration: 83 QT Interval:  243 QTC Calculation: 417 R Axis:   46 Text Interpretation: Atrial fibrillation with rapid V-rate Repolarization abnormality, prob rate related Since prior ECG< rate has increased Confirmed by Schlossman, Erin ([id number]) on 09/18/2020 5:49:51 PM   Radiology CT HEAD WO CONTRAST  Result  Date: 09/19/2020 CLINICAL DATA:  Delirium, tachycardia, unresponsiveness. History of CVA and atrial fibrillation EXAM: CT HEAD WITHOUT CONTRAST TECHNIQUE: Contiguous axial images were obtained from the base of the skull through the vertex without intravenous contrast. COMPARISON:  08/01/2020 FINDINGS: Brain: Cerebral atrophy. Ventricular dilatation consistent with central atrophy. Low-attenuation changes in the deep white matter consistent with small vessel ischemia. Evolving infarct in the right frontal region consistent with anterior cerebral and middle cerebral artery distributions. Basal ganglia calcifications. No acute intracranial hemorrhage. No developing mass effect or midline shift. No abnormal extra-axial fluid collections. Gray-white matter junctions are distinct. Basal cisterns are not effaced. Vascular: Intracranial arterial vascular calcifications. Skull: Calvarium appears intact. Sinuses/Orbits: Retention cysts in the maxillary antra. Paranasal sinuses and mastoid air cells are otherwise clear. Other: None. IMPRESSION: 1. Evolving infarct in the right frontal region consistent with anterior cerebral and middle cerebral artery distributions as seen on prior study. 2. No acute intracranial hemorrhage or mass effect. 3. Chronic atrophy and small vessel ischemia. Electronically Signed   By: William  Stevens M.D.   On: 09/19/2020 00:23   DG Chest Port 1 View  Result Date: 09/18/2020 CLINICAL DATA:  Sepsis EXAM: PORTABLE CHEST 1 VIEW COMPARISON:  08/16/2020 FINDINGS: Single frontal view of the chest demonstrates stable enlargement of the cardiac silhouette. Marked calcification of the   mitral annulus. No airspace disease, effusion, or pneumothorax. IMPRESSION: 1. Stable enlarged cardiac silhouette.  No acute process. Electronically Signed   By: Randa Ngo M.D.   On: 09/18/2020 19:02    Procedures .Critical Care Performed by: Gareth Morgan, MD Authorized by: Gareth Morgan, MD   Critical  care provider statement:    Critical care time (minutes):  45   Critical care was necessary to treat or prevent imminent or life-threatening deterioration of the following conditions:  Sepsis   Critical care was time spent personally by me on the following activities:  Evaluation of patient's response to treatment, examination of patient, ordering and performing treatments and interventions, ordering and review of laboratory studies, ordering and review of radiographic studies, pulse oximetry, re-evaluation of patient's condition, obtaining history from patient or surrogate and review of old charts   (including critical care time)  Medications Ordered in ED Medications  lactated ringers infusion ( Intravenous New Bag/Given 09/18/20 1846)  sodium chloride flush (NS) 0.9 % injection 10-40 mL (10 mLs Intracatheter Not Given 09/18/20 2125)  sodium chloride flush (NS) 0.9 % injection 10-40 mL (has no administration in time range)  vancomycin (VANCOREADY) IVPB 750 mg/150 mL (has no administration in time range)  ceFEPIme (MAXIPIME) 2 g in sodium chloride 0.9 % 100 mL IVPB (has no administration in time range)  amiodarone (NEXTERONE PREMIX) 360-4.14 MG/200ML-% (1.8 mg/mL) IV infusion (60 mg/hr Intravenous New Bag/Given 09/18/20 2255)  amiodarone (NEXTERONE PREMIX) 360-4.14 MG/200ML-% (1.8 mg/mL) IV infusion (has no administration in time range)  metroNIDAZOLE (FLAGYL) IVPB 500 mg (has no administration in time range)  lactated ringers bolus 1,000 mL (0 mLs Intravenous Stopped 09/18/20 2035)    And  lactated ringers bolus 1,000 mL (0 mLs Intravenous Stopped 09/18/20 1930)  ceFEPIme (MAXIPIME) 2 g in sodium chloride 0.9 % 100 mL IVPB (0 g Intravenous Stopped 09/18/20 1928)  metroNIDAZOLE (FLAGYL) IVPB 500 mg (0 mg Intravenous Stopped 09/18/20 2035)  acetaminophen (TYLENOL) suppository 650 mg (650 mg Rectal Given 09/18/20 1928)  vancomycin (VANCOREADY) IVPB 1250 mg/250 mL (0 mg Intravenous Stopped 09/18/20 2147)   metoprolol tartrate (LOPRESSOR) injection 2.5 mg (2.5 mg Intravenous Given 09/18/20 2152)  metoprolol tartrate (LOPRESSOR) injection 5 mg (5 mg Intravenous Given 09/18/20 2229)    ED Course  I have reviewed the triage vital signs and the nursing notes.  Pertinent labs & imaging results that were available during my care of the patient were reviewed by me and considered in my medical decision making (see chart for details).    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  Patient arrives to the emergency department on nonrebreather, febrile to 102, tachycardic to 190s and atrial fibrillation with RVR. Code sepsis was initiated, she is given broad-spectrum antibiotics and 30 cc/kg of lactated Ringer's.  Her neurologic exam appears similar to recent stroke and do not suspect new intracranial pathology at this time in setting of fever as cause for increased sleepiness.  Labs with leukocytosis, signs of UTI and mild lactic acidosis.  While heart rate improved with fluids,  antibiotics, acetaminophen to 160s, she continued to have tachycardia and metoprolol as ordered. Initially attempted to treat the atrial fibrillation with RVR by treating underlying cause and wanting to use caution given concern for sepsis/blood pressures. Given metoprolol 2.5mg without effect then given additional metoprolol.     Will admit for sepsis secondary to urinary source with afib RVR.   Final Clinical Impression(s) / ED Diagnoses Final diagnoses:  Sepsis without acute organ dysfunction, due to unspecified  organism (HCC)  Atrial fibrillation with RVR (HCC)    Rx / DC Orders ED Discharge Orders    None       Schlossman, Erin, MD 09/19/20 0042  

## 2020-09-18 NOTE — Progress Notes (Signed)
Pharmacy Antibiotic Note  Samantha Paul is a 74 y.o. female admitted on 09/18/2020 with sepsis.  Pharmacy has been consulted for vancomycin and cefepime dosing.  Tmax 102.28F, WBC 12.8. Kidney function appears to be at baseline, SCr 1.06 and CrCl ~37.9. Lactic acid 2.8.   Plan: Vancomycin 1,250mg  IV x1 Vancomycin 750mg  IV every 24 hours.  Goal trough 15-20 mcg/mL.  Cefepime 2g IV every 12 hours Follow up with cultures, antibiotic de-escalation and LOT Monitor renal function and clinical progress   Height: 5\' 2"  (157.5 cm) Weight: 54.8 kg (120 lb 13 oz) IBW/kg (Calculated) : 50.1  Temp (24hrs), Avg:102.4 F (39.1 C), Min:102.4 F (39.1 C), Max:102.4 F (39.1 C)  No results for input(s): WBC, CREATININE, LATICACIDVEN, VANCOTROUGH, VANCOPEAK, VANCORANDOM, GENTTROUGH, GENTPEAK, GENTRANDOM, TOBRATROUGH, TOBRAPEAK, TOBRARND, AMIKACINPEAK, AMIKACINTROU, AMIKACIN in the last 168 hours.  CrCl cannot be calculated (Patient's most recent lab result is older than the maximum 21 days allowed.).    Allergies  Allergen Reactions  . Metformin And Related Diarrhea    Abdominal pain and diarrhea    Antimicrobials this admission: Vancomycin 9/24>> Cefepime 9/24>>  Dose adjustments this admission: N/a  Microbiology results: 9/24 BCx: pending 9/24 UCx: pending  Thank you for allowing pharmacy to be a part of this patient's care.  10/24, PharmD PGY1 Acute Care Pharmacy Resident Please refer to [facility] for unit-specific pharmacist

## 2020-09-18 NOTE — ED Notes (Signed)
Patient transported to CT 

## 2020-09-18 NOTE — H&P (Addendum)
History and Physical  Samantha Paul KGM:[id number] DOB: 10-27-1946 DOA: 09/18/2020  Referring physician: Dr. Billy Fischer, EDP  PCP: Elwyn Reach, MD  Outpatient Specialists: Cardiology Patient coming from: SNF  Chief Complaint:  Tachycardia, generalized weakness  HPI: Samantha Paul is a 74 y.o. 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  History is obtained from patient's husband, EDP, and medical records.  Per the patient's husband he has been visiting her at the SNF daily, 2 days prior to presentation, he noted that her breathing was a little off and she was too weak to participate in physical therapy.  This morning when he visited, he became concerned about her appearance, she was weaker and minimally responsive.  Vital signs were checked and showed HR in the 190's.  EMS was called.  Per EMS she had episodes of apnea in route for which she was placed on nonrebreather.  Currently she is on 2 L with O2 saturation of 99%.  At the time of this visit she is minimally responsive, arousable to gentle sternal rubs but drifts back to sleep.   ED Course: Febrile with T-max 102.4, heart rate in the 160s, respiration rate 33.  Lab studies remarkable for leukocytosis with WBC 12.8K, neutrophilia 9.8.  Urine analysis positive for pyuria.  Review of Systems: Review of systems as noted in the HPI. All other systems reviewed and are negative.   Past Medical History:  Diagnosis Date  . Afib ([place]) 06/2020   Diagnosed 06/2020, on cardizem, metoprolol, eliquis  . Back pain   . CVA (cerebral vascular accident) ([place]) 07/26/2020   R MCA/ACA CVA  . DM II (diabetes mellitus, type II), controlled ([place])   . HTN (hypertension)   . Knee pain   . Stroke [facility]) ?    residual mild right sided weakness    Past Surgical History:  Procedure Laterality Date  . CHOLECYSTECTOMY N/A 06/30/2020   Procedure: LAPAROSCOPIC CHOLECYSTECTOMY;  Surgeon: Georganna Skeans, MD;  Location: [place];  Service: General;  Laterality: N/A;  . ESOPHAGOGASTRODUODENOSCOPY N/A 08/18/2020   Procedure: ESOPHAGOGASTRODUODENOSCOPY (EGD);  Surgeon: Georganna Skeans, MD;  Location: [place];  Service: Endoscopy;  Laterality: N/A;  . IR ANGIO VERTEBRAL SEL SUBCLAVIAN INNOMINATE UNI R MOD SED  07/29/2020  . IR PERCUTANEOUS ART THROMBECTOMY/INFUSION INTRACRANIAL INC DIAG ANGIO  07/26/2020  . PEG PLACEMENT N/A 08/18/2020   Procedure: PERCUTANEOUS ENDOSCOPIC GASTROSTOMY (PEG) PLACEMENT;  Surgeon: Georganna Skeans, MD;  Location: [place];  Service: Endoscopy;  Laterality: N/A;  . RADIOLOGY WITH ANESTHESIA N/A 07/26/2020   Procedure: IR WITH ANESTHESIA;  Surgeon: Radiologist, Medication, MD;  Location: [place];  Service: Radiology;  Laterality: N/A;    Social History:  reports that she has never smoked. She has never used smokeless tobacco. She reports previous alcohol use. She reports previous drug use.   Allergies  Allergen Reactions  . Metformin And Related Diarrhea    Abdominal pain and diarrhea    Family History  Problem Relation Age of Onset  . Arthritis Brother       Prior to Admission medications   Medication Sig Start Date End Date Taking? Authorizing Provider  acetaminophen (TYLENOL) 160 MG/5ML solution Place 20.3 mLs (650 mg total) into feeding tube every 4 (four) hours as needed for mild pain (or temp >  37.5 C (99.5 F)). 08/23/20  Yes Hosie Poisson, MD  acetaminophen (TYLENOL) 325 MG tablet Place 650 mg into feeding tube every 8 (eight) hours as needed for moderate pain.   Yes [provider]  apixaban (ELIQUIS) 5 MG TABS tablet Take 1 tablet (5 mg total) by mouth 2 (two) times daily. 07/03/20  Yes Oswald Hillock, MD  bethanechol (URECHOLINE) 10 MG tablet Place 1 tablet (10  mg total) into feeding tube 3 (three) times daily. 08/23/20  Yes Hosie Poisson, MD  diltiazem (CARDIZEM) 60 MG tablet Place 60 mg into feeding tube 4 (four) times daily.   Yes [provider]  ferrous sulfate 300 (60 Fe) MG/5ML syrup Place 5 mLs (300 mg total) into feeding tube 3 (three) times daily. 08/23/20  Yes Hosie Poisson, MD  insulin aspart (NOVOLOG) 100 UNIT/ML injection CBG 70 - 120: 0 units CBG 121 - 150: 1 unit CBG 151 - 200: 2 units CBG 201 - 250: 3 units CBG 251 - 300: 5 units CBG 301 - 350: 7 units CBG 351 - 400: 9 units Patient taking differently: Inject 0-9 Units into the skin. CBG 0 - 59 : Notify MD CBG 60 - 150: 0 units CBG 151 - 199: 2 units CBG 200 - 249: 4 units CBG 250 - 299: 6 units CBG 300 - 349: 8 units CBG 350 - 399: 10 units CBG [id number] : Notify MD 08/23/20  Yes Hosie Poisson, MD  insulin glargine (LANTUS) 100 UNIT/ML injection Inject 0.22 mLs (22 Units total) into the skin daily. Patient taking differently: Inject 25 Units into the skin daily.  08/24/20  Yes Hosie Poisson, MD  metFORMIN (GLUCOPHAGE) 500 MG tablet Take 1 tablet (500 mg total) by mouth 2 (two) times daily with a meal. 07/03/20 07/03/21 Yes Darrick Meigs, Marge Duncans, MD  methylphenidate (RITALIN) 5 MG tablet Take 5 mg by mouth 2 (two) times daily.   Yes [provider]  metoprolol tartrate (LOPRESSOR) 50 MG tablet Place 1 tablet (50 mg total) into feeding tube 2 (two) times daily. 08/23/20  Yes Hosie Poisson, MD  Nutritional Supplements (FEEDING SUPPLEMENT, JEVITY 1.2 CAL,) LIQD Place 1,000 mLs into feeding tube continuous. 08/23/20  Yes Hosie Poisson, MD  rosuvastatin (CRESTOR) 20 MG tablet Place 1 tablet (20 mg total) into feeding tube daily. 08/24/20  Yes Hosie Poisson, MD  senna (SENOKOT) 8.6 MG tablet Take 2 tablets by mouth daily.   Yes [provider]  blood glucose meter kit and supplies KIT Dispense based on patient and insurance preference. Use up to four times daily as directed. (FOR  ICD-10--E11.9). 07/25/18   Love, Ivan Anchors, PA-C  diltiazem (CARDIZEM) 10 mg/ml oral suspension Place 6 mLs (60 mg total) into feeding tube every 6 (six) hours. Patient not taking: Reported on 09/18/2020 08/23/20   Hosie Poisson, MD  Insulin Pen Needle (PEN NEEDLES) 32G X 4 MM MISC Use as directed with insulin pen 07/03/20   Oswald Hillock, MD  metroNIDAZOLE (FLAGYL) 50 mg/ml oral suspension Place 10 mLs (500 mg total) into feeding tube 3 (three) times daily. Take until 08/28/20 Patient not taking: Reported on 09/18/2020 08/23/20   Hosie Poisson, MD  Nutritional Supplements (FEEDING SUPPLEMENT, PROSOURCE TF,) liquid Place 45 mLs into feeding tube daily. Patient not taking: Reported on 09/18/2020 08/24/20   Hosie Poisson, MD  senna-docusate (SENOKOT-S) 8.6-50 MG tablet Take 2 tablets by mouth at bedtime. Patient not taking: Reported on 09/18/2020 07/25/18   Bary Leriche, PA-C  vancomycin (  VANCOCIN) 50 mg/mL oral solution Place 2.5 mLs (125 mg total) into feeding tube every 6 (six) hours. Take until 08/28/20 Patient not taking: Reported on 09/18/2020 08/23/20   Hosie Poisson, MD  Water For Irrigation, Sterile (FREE WATER) SOLN Place 100 mLs into feeding tube every 8 (eight) hours. 08/23/20   Hosie Poisson, MD    Physical Exam: BP (!) 123/98   Pulse (!) 160   Temp (!) 100.5 F (38.1 C)   Resp (!) 21   Ht _0  (1.575 m)   Wt 54.8 kg   SpO2 99%   BMI 22.10 kg/m   . General: 74 y.o. year-old female well developed well nourished in no acute distress.  Minimally responsive.  Arousable to gentle sternal rubs but goes back to sleep. . Cardiovascular: Irregular rate and rhythm with no rubs or gallops.  No thyromegaly or JVD noted.  Dependent edema throughout. Marland Kitchen Respiratory: Mild diffuse rales noted bilaterally.  Poor inspiratory effort. . Abdomen: Soft nontender nondistended with normal bowel sounds present.  PEG tube in place. . Muskuloskeletal: Dependent edema noted throughout. Marland Kitchen Neuro: Patient does not follow  commands, is minimally arousable. . Skin: No ulcerative lesions noted or rashes . Psychiatry: Unable to assess her mood due to minimal responsiveness.         Labs on Admission:  Basic Metabolic Panel: Recent Labs  Lab 09/18/20 2005  NA 139  K 5.3*  CL 106  CO2 25  GLUCOSE 212*  BUN 36*  CREATININE 1.06*  CALCIUM 7.9*   Liver Function Tests: Recent Labs  Lab 09/18/20 2005  AST 54*  ALT 40  ALKPHOS 54  BILITOT 0.8  PROT 5.5*  ALBUMIN 1.8*   No results for input(s): LIPASE, AMYLASE in the last 168 hours. No results for input(s): AMMONIA in the last 168 hours. CBC: Recent Labs  Lab 09/18/20 1845  WBC 12.8*  NEUTROABS 9.8*  HGB 10.4*  HCT 36.5  MCV 83.0  PLT 189   Cardiac Enzymes: No results for input(s): CKTOTAL, CKMB, CKMBINDEX, TROPONINI in the last 168 hours.  BNP (last 3 results) No results for input(s): BNP in the last 8760 hours.  ProBNP (last 3 results) No results for input(s): PROBNP in the last 8760 hours.  CBG: Recent Labs  Lab 09/18/20 1733  GLUCAP 220*    Radiological Exams on Admission: DG Chest Port 1 View  Result Date: 09/18/2020 CLINICAL DATA:  Sepsis EXAM: PORTABLE CHEST 1 VIEW COMPARISON:  08/16/2020 FINDINGS: Single frontal view of the chest demonstrates stable enlargement of the cardiac silhouette. Marked calcification of the mitral annulus. No airspace disease, effusion, or pneumothorax. IMPRESSION: 1. Stable enlarged cardiac silhouette.  No acute process. Electronically Signed   By: Randa Ngo M.D.   On: 09/18/2020 19:02    EKG: I independently viewed the EKG done and my findings are as followed: A. fib with RVR of 177.  Assessment/Plan Present on Admission: . Sepsis ([place])  Active Problems:   Sepsis ([place])  Severe sepsis secondary to presumed UTI Presented minimally responsive, febrile with T-max 102.4, tachycardia with heart rate of 160, tachypnea with respiratory rate of 33, urine analysis positive for pyuria Lactic  acid peaked at 2.8 trending down Obtain urine culture, blood culture x2 peripherally Follow cultures Continue IV empiric antibiotics started in the ED, IV vancomycin, cefepime, and IV Flagyl Continue IV fluid Obtain MRSA screen Obtain procalcitonin level Monitor fever curve and WBC Maintain MAP greater than 65 CBC with differentials in the morning  Toxic metabolic encephalopathy likely secondary to severe sepsis Presented minimally responsive We will obtain a CT head to rule out any acute intracranial etiologies Rest of management as noted above  Chronic A. fib with RVR Presented with heart rate in the 160s, EKG showing A. fib with RVR Suspect RVR driven by severe sepsis Blood pressures are soft Started on amiodarone drip Last 2D echo done on 07/03/2020 showed LVEF 65 to 70%, mild mitral valve regurgitation, left atrial size was normal size. Obtain TSH Resume home Eliquis for secondary CVA prevention if no intracranial hemorrhage on CT head Resume lower dose home metoprolol via PEG tube Hold off home Cardizem for now due to soft blood pressures Continue to closely monitor on telemetry  Acute hypoxic respiratory Failure with periods of apnea, possibly central Periods of apnea en route, per EMS, requiring nonrebreather. Not on oxygen supplementation at baseline Currently on 2 L to maintain O2 saturation greater than 92% Personally reviewed chest x-ray done on admission which shows some atelectasis at bases.  No lobular infiltrates or increase in pulmonary vascularity. Continue to closely monitor and maintain O2 saturation greater than 71%  Chronic diastolic CHF Closely monitor volume status while on IV fluid Strict I's and O's and daily weight  Type 2 diabetes with hyperglycemia Presented with serum glucose greater than 200 Hemoglobin A1c 8.7 on 07/27/2020 Hold off home oral hypoglycemics Start insulin sliding scale  Hyperkalemia Presented with serum potassium 5.3 Continue IV  fluid and repeat serum potassium  Isolated elevated AST AST mildly elevated at 54 Monitor and repeat CMP in the morning  Iron deficiency anemia Hemoglobin stable at 10.4 No overt bleeding noted on exam Resume home iron supplement  Hyperlipidemia Resume home Crestor  Essential hypertension, blood pressures are currently soft Resume home metoprolol, lower dose Hold off home Cardizem due to soft blood pressures to avoid hypotension Currently on amiodarone drip for A. fib with RVR Continue to closely monitor vital signs  Dysphagia post PEG tube placement Aspiration precautions Dietary consult Resume feedings in the morning  Chronic urinary retention post indwelling Foley catheter Resume home regimen Foley catheter was exchanged in the ED on 09/18/2020.  DVT prophylaxis: SCDs, resume Eliquis if CT head negative for intracranial hemorrhage.  Code Status: Full code as stated by her husband via phone.  Family Communication: Updated husband via phone.  Disposition Plan: Admit to stepdown unit.  Consults called: None.  Admission status: Inpatient status.   Status is: Inpatient    Dispo:  Patient From: [place]  Planned Disposition: [facility]  Expected discharge date: 09/22/20  Medically stable for discharge: No, ongoing management for severe sepsis and A. fib with RVR.        Kayleen Memos MD [facility] Pager [phone]  If 7PM-7AM, please contact night-coverage [email or web address] Password [email or web address]  09/18/2020, 11:07 PM

## 2020-09-18 NOTE — ED Triage Notes (Signed)
Pt BIB [facility] from [facility].   Staff called for Tachycardia. Upon EMS arrival, pt was tachycardic at 180.  Pt was also slightly unresponsive but en route pt became extremely unresponsive and EMS gave BVM for 2 minutes with return of spontaneous Breathing.  Pt was hypotensive at 80s systolic (EMS gave 1000 NS IV)  Pt also febrile with EMS at 101.5.  NPA placed by EMS but removed upon arrival to ED.  Baseline is A&Ox1 per facility.   Previous Hx of stroke with left sided deficits.

## 2020-09-18 NOTE — ED Notes (Signed)
EDP made aware of persistent Tachycardia despite medication admin. No new orders at this time.

## 2020-09-19 ENCOUNTER — Inpatient Hospital Stay (HOSPITAL_COMMUNITY): Payer: Medicare Other

## 2020-09-19 LAB — COMPREHENSIVE METABOLIC PANEL
ALT: 44 U/L (ref 0–44)
AST: 33 U/L (ref 15–41)
Albumin: 2 g/dL — ABNORMAL LOW (ref 3.5–5.0)
Alkaline Phosphatase: 64 U/L (ref 38–126)
Anion gap: 12 (ref 5–15)
BUN: 32 mg/dL — ABNORMAL HIGH (ref 8–23)
CO2: 22 mmol/L (ref 22–32)
Calcium: 8.3 mg/dL — ABNORMAL LOW (ref 8.9–10.3)
Chloride: 104 mmol/L (ref 98–111)
Creatinine, Ser: 0.91 mg/dL (ref 0.44–1.00)
GFR calc Af Amer: >60 mL/min (ref >60)
GFR calc non Af Amer: >60 mL/min (ref >60)
Glucose, Bld: 179 mg/dL — ABNORMAL HIGH (ref 70–99)
Potassium: 4.3 mmol/L (ref 3.5–5.1)
Sodium: 138 mmol/L (ref 135–145)
Total Bilirubin: 0.8 mg/dL (ref 0.3–1.2)
Total Protein: 6.1 g/dL — ABNORMAL LOW (ref 6.5–8.1)

## 2020-09-19 LAB — CBC WITH DIFFERENTIAL/PLATELET
Abs Immature Granulocytes: 0.04 10*3/uL (ref 0.00–0.07)
Basophils Absolute: 0.1 10*3/uL (ref 0.0–0.1)
Basophils Relative: 1 %
Eosinophils Absolute: 0.1 10*3/uL (ref 0.0–0.5)
Eosinophils Relative: 1 %
HCT: 30.3 % — ABNORMAL LOW (ref 36.0–46.0)
Hemoglobin: 8.8 g/dL — ABNORMAL LOW (ref 12.0–15.0)
Immature Granulocytes: 0 %
Lymphocytes Relative: 22 %
Lymphs Abs: 2.4 10*3/uL (ref 0.7–4.0)
MCH: 23.7 pg — ABNORMAL LOW (ref 26.0–34.0)
MCHC: 29 g/dL — ABNORMAL LOW (ref 30.0–36.0)
MCV: 81.5 fL (ref 80.0–100.0)
Monocytes Absolute: 0.8 10*3/uL (ref 0.1–1.0)
Monocytes Relative: 8 %
Neutro Abs: 7.5 10*3/uL (ref 1.7–7.7)
Neutrophils Relative %: 68 %
Platelets: 161 10*3/uL (ref 150–400)
RBC: 3.72 MIL/uL — ABNORMAL LOW (ref 3.87–5.11)
RDW: 18.1 % — ABNORMAL HIGH (ref 11.5–15.5)
WBC: 10.9 10*3/uL — ABNORMAL HIGH (ref 4.0–10.5)
nRBC: 0.2 % (ref 0.0–0.2)

## 2020-09-19 LAB — GLUCOSE, CAPILLARY
Glucose-Capillary: 179 mg/dL — ABNORMAL HIGH (ref 70–99)
Glucose-Capillary: 186 mg/dL — ABNORMAL HIGH (ref 70–99)
Glucose-Capillary: 228 mg/dL — ABNORMAL HIGH (ref 70–99)
Glucose-Capillary: 252 mg/dL — ABNORMAL HIGH (ref 70–99)
Glucose-Capillary: 296 mg/dL — ABNORMAL HIGH (ref 70–99)

## 2020-09-19 LAB — HEMOGLOBIN A1C
Hgb A1c MFr Bld: 7.2 % — ABNORMAL HIGH (ref 4.8–5.6)
Mean Plasma Glucose: 159.94 mg/dL

## 2020-09-19 LAB — TSH: TSH: 0.62 u[IU]/mL (ref 0.350–4.500)

## 2020-09-19 LAB — PROCALCITONIN: Procalcitonin: 0.2 ng/mL

## 2020-09-19 MED ORDER — METHYLPHENIDATE HCL 5 MG PO TABS
5.0000 mg | ORAL_TABLET | Freq: Two times a day (BID) | ORAL | Status: DC
  Administered 2020-09-19 – 2020-09-22 (×7): 5 mg
  Filled 2020-09-19 (×7): qty 1

## 2020-09-19 MED ORDER — INSULIN ASPART 100 UNIT/ML ~~LOC~~ SOLN
0.0000 [IU] | SUBCUTANEOUS | Status: DC
  Administered 2020-09-19: 5 [IU] via SUBCUTANEOUS
  Administered 2020-09-19: 3 [IU] via SUBCUTANEOUS
  Administered 2020-09-19: 2 [IU] via SUBCUTANEOUS
  Administered 2020-09-20 (×2): 3 [IU] via SUBCUTANEOUS
  Administered 2020-09-20: 5 [IU] via SUBCUTANEOUS
  Administered 2020-09-20 (×2): 3 [IU] via SUBCUTANEOUS
  Administered 2020-09-20: 5 [IU] via SUBCUTANEOUS
  Administered 2020-09-21: 2 [IU] via SUBCUTANEOUS
  Administered 2020-09-21: 3 [IU] via SUBCUTANEOUS
  Administered 2020-09-21: 2 [IU] via SUBCUTANEOUS
  Administered 2020-09-21: 3 [IU] via SUBCUTANEOUS
  Administered 2020-09-21 – 2020-09-22 (×3): 2 [IU] via SUBCUTANEOUS
  Administered 2020-09-22: 3 [IU] via SUBCUTANEOUS

## 2020-09-19 MED ORDER — FERROUS SULFATE 300 (60 FE) MG/5ML PO SYRP
300.0000 mg | ORAL_SOLUTION | Freq: Three times a day (TID) | ORAL | Status: DC
  Administered 2020-09-19 – 2020-09-22 (×10): 300 mg
  Filled 2020-09-19 (×13): qty 5

## 2020-09-19 MED ORDER — CHLORHEXIDINE GLUCONATE CLOTH 2 % EX PADS
6.0000 | MEDICATED_PAD | Freq: Every day | CUTANEOUS | Status: DC
  Administered 2020-09-19 – 2020-09-22 (×4): 6 via TOPICAL

## 2020-09-19 MED ORDER — APIXABAN 5 MG PO TABS
5.0000 mg | ORAL_TABLET | Freq: Two times a day (BID) | ORAL | Status: DC
  Administered 2020-09-19 – 2020-09-22 (×7): 5 mg
  Filled 2020-09-19 (×7): qty 1

## 2020-09-19 MED ORDER — DILTIAZEM HCL-DEXTROSE 125-5 MG/125ML-% IV SOLN (PREMIX)
5.0000 mg/h | INTRAVENOUS | Status: DC
  Administered 2020-09-19: 5 mg/h via INTRAVENOUS
  Filled 2020-09-19 (×2): qty 125

## 2020-09-19 MED ORDER — INSULIN GLARGINE 100 UNIT/ML ~~LOC~~ SOLN
15.0000 [IU] | Freq: Every day | SUBCUTANEOUS | Status: DC
  Administered 2020-09-19: 15 [IU] via SUBCUTANEOUS
  Filled 2020-09-19 (×2): qty 0.15

## 2020-09-19 MED ORDER — ACETAMINOPHEN 160 MG/5ML PO SOLN
650.0000 mg | ORAL | Status: DC | PRN
  Administered 2020-09-19 – 2020-09-22 (×4): 650 mg
  Filled 2020-09-19 (×4): qty 20.3

## 2020-09-19 MED ORDER — LACTATED RINGERS IV SOLN: INTRAVENOUS | Status: AC

## 2020-09-19 MED ORDER — APIXABAN 5 MG PO TABS: 5.0000 mg | ORAL_TABLET | Freq: Two times a day (BID) | ORAL | Status: DC

## 2020-09-19 MED ORDER — JEVITY 1.2 CAL PO LIQD
1000.0000 mL | ORAL | Status: DC
  Filled 2020-09-19: qty 1000

## 2020-09-19 MED ORDER — SENNA 8.6 MG PO TABS
2.0000 | ORAL_TABLET | Freq: Every day | ORAL | Status: DC
  Administered 2020-09-19 – 2020-09-22 (×4): 17.2 mg via ORAL
  Filled 2020-09-19 (×7): qty 2

## 2020-09-19 MED ORDER — METOPROLOL TARTRATE 12.5 MG HALF TABLET
50.0000 mg | ORAL_TABLET | Freq: Two times a day (BID) | ORAL | Status: DC
  Administered 2020-09-19 – 2020-09-22 (×7): 50 mg
  Filled 2020-09-19 (×7): qty 4

## 2020-09-19 MED ORDER — BETHANECHOL CHLORIDE 10 MG PO TABS
10.0000 mg | ORAL_TABLET | Freq: Three times a day (TID) | ORAL | Status: DC
  Administered 2020-09-19 – 2020-09-22 (×10): 10 mg
  Filled 2020-09-19 (×12): qty 1

## 2020-09-19 MED ORDER — ROSUVASTATIN CALCIUM 20 MG PO TABS
20.0000 mg | ORAL_TABLET | Freq: Every day | ORAL | Status: DC
  Administered 2020-09-19 – 2020-09-22 (×4): 20 mg
  Filled 2020-09-19 (×4): qty 1

## 2020-09-19 MED ORDER — PROSOURCE TF PO LIQD
45.0000 mL | Freq: Every day | ORAL | Status: DC
  Administered 2020-09-19 – 2020-09-22 (×4): 45 mL
  Filled 2020-09-19 (×4): qty 45

## 2020-09-19 MED ORDER — JEVITY 1.2 CAL PO LIQD
1000.0000 mL | ORAL | Status: DC
  Filled 2020-09-19 (×5): qty 1000

## 2020-09-19 MED ORDER — FREE WATER
100.0000 mL | Freq: Three times a day (TID) | Status: DC
  Administered 2020-09-19 – 2020-09-22 (×11): 100 mL

## 2020-09-19 MED ORDER — DILTIAZEM HCL 60 MG PO TABS
60.0000 mg | ORAL_TABLET | Freq: Four times a day (QID) | ORAL | Status: DC
  Administered 2020-09-19 – 2020-09-22 (×14): 60 mg
  Filled 2020-09-19 (×14): qty 1

## 2020-09-19 MED ORDER — AMIODARONE IV BOLUS ONLY 150 MG/100ML
150.0000 mg | Freq: Once | INTRAVENOUS | Status: AC
  Administered 2020-09-19: 150 mg via INTRAVENOUS

## 2020-09-19 NOTE — Progress Notes (Signed)
PROGRESS NOTE  Samantha Paul  DOB: [birth year]  PCP: Rometta Emery, MD QIW:[id number]  DOA: 09/18/2020  LOS: 1 day   Chief Complaint  Patient presents with  . Tachycardia  . Unresponsive   Brief narrative: Samantha Paul is a 74 y.o. female with PMH of DM2, HTN, chronic A. fib, CVA in August 2021 with left-sided hemiplegia and expressive aphasia, dysphagia on PEG tube feeding and chronic Foley catheter. Patient was sent to the ED on 09/18/2020 from SNF for altered mental status and tachycardia up to 190s.  Patient husband who visits her in the SNF. Over the last few weeks, she has been improving physically but for the last 2 to 3 days, has been noted progressive shortness of breath, generalized weakness and inability to participate with physical therapy. On the morning of 9/24, he found her weaker and minimally responsive.  Heart rate was elevated to 190s. EMS was called. In route to the hospital, she had episodes of apnea and was placed on nonrebreather.  In the ED, patient was arousable on sternal rub. Temperature 102.4, heart rate in 175, RR 27, blood pressure over 100, required 6 L oxygen to maintain oxygen saturation. Labs Labs showed potassium elevated to 5.3, glucose 212, albumin low at 1.8, lactic acid elevated to 2.2, WBC 12.8, hemoglobin 10.4. Respiratory virus panel including Covid PCR and influenza a and B PCR were negative. Chest x-ray unremarkable. Urinalysis with cloudy amber urine, large amount of leukocytes, rare bacteria The scan of head did not show any acute intracranial hemorrhage or mass-effect.  It showed evolving infarct in the right frontal region consistent with anterior cerebral and middle cerebral artery distributions as seen in CT scan from August.  Subjective: Patient was seen and examined this morning. Elderly [place] female. Husband at bedside who helped with translation. Patient was awake and able to follow motor commands on the right. Has hemiplegia on  the left. Chart reviewed. Patient was constantly febrile to 100.5 overnight. Remains persistently tachycardic to 140s in the night, blood pressure maintained however. Labs pending this morning  Assessment/Plan: Severe sepsis -POA  Urinary tract infection related to indwelling chronic Foley catheter -Brought from SNF with fever, tachypnea, leukocytosis, lactic acidosis -Urinalysis positive for pyuria.  -Blood culture sent.  Currently on broad-spectrum IV antibiotics with IV cefepime, IV vancomycin and IV Flagyl. -We will choose monotherapy with IV cefepime only for now. -Lactic acid improving with IV fluids. -Currently on LR at 150 mL/h. Will cut down to 75 mm/h. -Monitor temperature trend, WBC and lactic acid.   Recent Labs  Lab 09/18/20 1747 09/18/20 1845 09/18/20 2145 09/19/20 0858  WBC  --  12.8*  --  10.9*  LATICACIDVEN 2.8*  --  2.2*  --   PROCALCITON  --   --   --  0.20   Acute metabolic encephalopathy -Multifactorial: Sepsis, UTI, recent stroke -CT head unremarkable for acute findings.   -Was minimally responsive in the ED.   -Mental status improving now. Continue to monitor mental status.  Chronic A. fib with RVR -Presented with RVR triggered by sepsis -Last echo from July 2021 showed EF of 65 to 70%. -Patient was started on amiodarone drip last night. -Home meds include Cardizem 60 mg 4 times daily, metoprolol 50 mg twice daily, Eliquis. -I would resume both metoprolol and Cardizem (at a lower dose) at this time. -I will also stop amiodarone drip and start the patient on Cardizem drip. I believe RVR is triggered by sepsis and patient most likely  would respond to her home dose of AV nodal blocking agents. -Continue Eliquis for CVA prevention.  Acute hypoxic respiratory  failure  -Episodes of apnea was noted by EMS and patient was started on nonrebreather.  -Chest x-ray normal.  Improving oxygen requirement. -Wean down as tolerated.  Chronic diastolic  CHF Essential hypertension -Not on diuretics at home. -Continue to monitor blood pressure and heart rate symptoms on Cardizem and metoprolol  Type 2 diabetes mellitus Hyperglycemia -A1c 8.7 on 07/27/2020 -Home meds include Lantus 22 units daily, Metformin 500 mg twice daily -Resume Lantus at 15 units along with sliding scale insulin. Recent Labs  Lab 09/18/20 1733 09/19/20 0852 09/19/20 1209  GLUCAP 220* 186* 179*   Hyperkalemia -Expect improvement with hydration. Recent Labs  Lab 09/18/20 2005 09/19/20 0858  K 5.3* 4.3   Protein calorie malnutrition Hypoalbuminemia -Albumin level low at 1.8.  Nutrition consult  Hyperlipidemia -Continue Crestor  Dysphagia post PEG tube placement Aspiration precautions Dietary consult Resume PEG tube feeding.  Chronic urinary retention post indwelling Foley catheter -Foley catheter exchange in the ED.  Mobility: PT eval Code Status:   Code Status: Full Code  Nutritional status: Body mass index is 22.1 kg/m.     Diet Order            Diet NPO time specified  Diet effective now                DVT prophylaxis:  apixaban (ELIQUIS) tablet 5 mg   Antimicrobials:  IV cefepime Fluid: Normal saline at 75 mL/h Consultants: None Family Communication:  Husband at bedside  Status is: Inpatient  Remains inpatient appropriate because:Hemodynamically unstable, Ongoing diagnostic testing needed not appropriate for outpatient work up and IV treatments appropriate due to intensity of illness or inability to take PO   Dispo:  Patient From: Skilled Nursing Facility  Planned Disposition: Skilled Nursing Facility  Expected discharge date: 09/22/20  Medically stable for discharge: No        Infusions:  . ceFEPime (MAXIPIME) IV 2 g (09/19/20 0852)  . diltiazem (CARDIZEM) infusion    . feeding supplement (JEVITY 1.2 CAL) 55 mL/hr at 09/19/20 1140  . lactated ringers 150 mL/hr at 09/19/20 0128    Scheduled Meds: .  apixaban  5 mg Per Tube BID  . bethanechol  10 mg Per Tube TID  . Chlorhexidine Gluconate Cloth  6 each Topical Daily  . diltiazem  60 mg Per Tube QID  . ferrous sulfate  300 mg Per Tube TID  . free water  100 mL Per Tube Q8H  . insulin aspart  0-9 Units Subcutaneous Q4H  . insulin glargine  15 Units Subcutaneous Daily  . methylphenidate  5 mg Per Tube BID  . metoprolol tartrate  50 mg Per Tube BID  . rosuvastatin  20 mg Per Tube Daily  . senna  2 tablet Oral Daily  . sodium chloride flush  10-40 mL Intracatheter Q12H    Antimicrobials: Anti-infectives (From admission, onward)   Start     Dose/Rate Route Frequency Ordered Stop   09/19/20 2000  vancomycin (VANCOREADY) IVPB 750 mg/150 mL  Status:  Discontinued        750 mg 150 mL/hr over 60 Minutes Intravenous Every 24 hours 09/18/20 2125 09/19/20 0846   09/19/20 0800  ceFEPIme (MAXIPIME) 2 g in sodium chloride 0.9 % 100 mL IVPB  Status:  Discontinued        2 g 200 mL/hr over 30 Minutes Intravenous Every 12  hours 09/18/20 2127 09/18/20 2131   09/19/20 0700  ceFEPIme (MAXIPIME) 2 g in sodium chloride 0.9 % 100 mL IVPB        2 g 200 mL/hr over 30 Minutes Intravenous Every 12 hours 09/18/20 2131     09/19/20 0600  metroNIDAZOLE (FLAGYL) IVPB 500 mg  Status:  Discontinued        500 mg 100 mL/hr over 60 Minutes Intravenous Every 8 hours 09/18/20 2323 09/19/20 0846   09/18/20 1800  ceFEPIme (MAXIPIME) 2 g in sodium chloride 0.9 % 100 mL IVPB        2 g 200 mL/hr over 30 Minutes Intravenous  Once 09/18/20 1748 09/18/20 1928   09/18/20 1800  metroNIDAZOLE (FLAGYL) IVPB 500 mg        500 mg 100 mL/hr over 60 Minutes Intravenous  Once 09/18/20 1748 09/18/20 2035   09/18/20 1800  vancomycin (VANCOCIN) IVPB 1000 mg/200 mL premix  Status:  Discontinued        1,000 mg 200 mL/hr over 60 Minutes Intravenous  Once 09/18/20 1748 09/18/20 1756   09/18/20 1800  vancomycin (VANCOREADY) IVPB 1250 mg/250 mL        1,250 mg 166.7 mL/hr over 90  Minutes Intravenous  Once 09/18/20 1756 09/18/20 2147      PRN meds: acetaminophen, sodium chloride flush   Objective: Vitals:   09/19/20 0736 09/19/20 1227  BP:    Pulse:    Resp:    Temp: (!) 97.3 F (36.3 C) 97.7 F (36.5 C)  SpO2:      Intake/Output Summary (Last 24 hours) at 09/19/2020 1257 Last data filed at 09/19/2020 0500 Gross per 24 hour  Intake 4815.98 ml  Output 350 ml  Net 4465.98 ml   Filed Weights   09/18/20 1750  Weight: 54.8 kg   Weight change:  Body mass index is 22.1 kg/m.   Physical Exam: General exam: Appears calm and comfortable. Not in physical distress Skin: No rashes, lesions or ulcers. HEENT: Atraumatic, normocephalic, supple neck, no obvious bleeding Lungs: Clear to auscultation bilaterally CVS: Irregular tachycardic, no murmur GI/Abd soft, nontender, nondistended, bowel sound present. PEG tube site intact. CNS: Alert, awake, nonverbal after last stroke Psychiatry: Depressed look Extremities: No pedal edema, no calf tenderness  Data Review: I have personally reviewed the laboratory data and studies available.  Recent Labs  Lab 09/18/20 1845 09/19/20 0858  WBC 12.8* 10.9*  NEUTROABS 9.8* 7.5  HGB 10.4* 8.8*  HCT 36.5 30.3*  MCV 83.0 81.5  PLT 189 161   Recent Labs  Lab 09/18/20 2005 09/19/20 0858  NA 139 138  K 5.3* 4.3  CL 106 104  CO2 25 22  GLUCOSE 212* 179*  BUN 36* 32*  CREATININE 1.06* 0.91  CALCIUM 7.9* 8.3*   F/u labs ordered.  Signed, Lorin Glass, MD [facility] 09/19/2020

## 2020-09-19 NOTE — Progress Notes (Signed)
Initial Nutrition Assessment  DOCUMENTATION CODES:   Not applicable  INTERVENTION:  Increase Jevity 1.2 formula via PEG to new goal rate of 60 ml/hr.   Provide 45 ml Prosource TF once daily per tube.   Free water flushes of 100 ml q 8 hours. (MD to adjust as appropriate)  Tube feeding regimen provides 1768 kcal (100% of needs), 91 grams of protein, and 1466 ml of H2O.   NUTRITION DIAGNOSIS:   Inadequate oral intake related to inability to eat as evidenced by NPO status.  GOAL:   Patient will meet greater than or equal to 90% of their needs  MONITOR:   TF tolerance, Skin, Weight trends, Labs, I & O's  REASON FOR ASSESSMENT:   Consult Enteral/tube feeding initiation and management  ASSESSMENT:   74 y.o. female with PMH of DM2, HTN, chronic A. fib, CVA in August 2021 with left-sided hemiplegia and expressive aphasia, dysphagia on PEG tube feeding and chronic Foley catheter presents with tachycardia and AMS. Pt with severe sepsis, urinary tract infection related to indwelling chronic Foley catheter  Pt from SNF. Per weight records, pt with a 15% weight loss over the past 1 month which is significant for time fame. Pt previously had been receiving Jevity 1.2 formula via PEG at rate of 55 ml/hr which provides 1584 kcal, 73 grams of protein. RD to increase rate of tube feeding to meet new re-estimated needs and to prevent further weight loss.   Unable to complete Nutrition-Focused physical exam at this time. RD working remotely.  Labs and medications reviewed.   Diet Order:   Diet Order            Diet NPO time specified  Diet effective now                 EDUCATION NEEDS:   Not appropriate for education at this time  Skin:  Skin Assessment: Reviewed RN Assessment  Last BM:  Unknown  Height:   Ht Readings from Last 1 Encounters:  09/18/20 5\' 2"  (1.575 m)    Weight:   Wt Readings from Last 1 Encounters:  09/18/20 54.8 kg    BMI:  Body mass index is 22.1  kg/m.  Estimated Nutritional Needs:   Kcal:  1650-1850  Protein:  75-90 grams  Fluid:  >/= 1.7 L/day  09/20/20, MS, RD, LDN RD pager number/after hours weekend pager number on Amion.

## 2020-09-19 NOTE — Progress Notes (Signed)
Arrived to floor from ED and situated in  room and bed..  Pt  Is drowsy  and not alert to communicate. Pt's husband called and update was given. Amiodarone drip infusing @ 33.3 ml/hr, HR ranging 150-170's. LR @ 150 ml/hr, G tube to abdomen clamped, skin is clear and checked with [place] RN. Foley patent and draining yellow urine. Will cont to monitor.  [phone] Paged Dr Antionette Char regarding HR fluctuating between 155-180, B/P 134/96.  0219 New orders received for Amiodarone 150 mg bolus- same given at this time

## 2020-09-20 LAB — BASIC METABOLIC PANEL
Anion gap: 9 (ref 5–15)
BUN: 34 mg/dL — ABNORMAL HIGH (ref 8–23)
CO2: 23 mmol/L (ref 22–32)
Calcium: 8 mg/dL — ABNORMAL LOW (ref 8.9–10.3)
Chloride: 101 mmol/L (ref 98–111)
Creatinine, Ser: 0.8 mg/dL (ref 0.44–1.00)
GFR calc Af Amer: >60 mL/min (ref >60)
GFR calc non Af Amer: >60 mL/min (ref >60)
Glucose, Bld: 301 mg/dL — ABNORMAL HIGH (ref 70–99)
Potassium: 3.9 mmol/L (ref 3.5–5.1)
Sodium: 133 mmol/L — ABNORMAL LOW (ref 135–145)

## 2020-09-20 LAB — CBC WITH DIFFERENTIAL/PLATELET
Abs Immature Granulocytes: 0.04 10*3/uL (ref 0.00–0.07)
Basophils Absolute: 0 10*3/uL (ref 0.0–0.1)
Basophils Relative: 1 %
Eosinophils Absolute: 0.2 10*3/uL (ref 0.0–0.5)
Eosinophils Relative: 2 %
HCT: 29.5 % — ABNORMAL LOW (ref 36.0–46.0)
Hemoglobin: 8.6 g/dL — ABNORMAL LOW (ref 12.0–15.0)
Immature Granulocytes: 1 %
Lymphocytes Relative: 20 %
Lymphs Abs: 1.7 10*3/uL (ref 0.7–4.0)
MCH: 23.4 pg — ABNORMAL LOW (ref 26.0–34.0)
MCHC: 29.2 g/dL — ABNORMAL LOW (ref 30.0–36.0)
MCV: 80.2 fL (ref 80.0–100.0)
Monocytes Absolute: 0.5 10*3/uL (ref 0.1–1.0)
Monocytes Relative: 6 %
Neutro Abs: 5.9 10*3/uL (ref 1.7–7.7)
Neutrophils Relative %: 70 %
Platelets: 131 10*3/uL — ABNORMAL LOW (ref 150–400)
RBC: 3.68 MIL/uL — ABNORMAL LOW (ref 3.87–5.11)
RDW: 17.8 % — ABNORMAL HIGH (ref 11.5–15.5)
WBC: 8.3 10*3/uL (ref 4.0–10.5)
nRBC: 0 % (ref 0.0–0.2)

## 2020-09-20 LAB — GLUCOSE, CAPILLARY
Glucose-Capillary: 262 mg/dL — ABNORMAL HIGH (ref 70–99)
Glucose-Capillary: 243 mg/dL — ABNORMAL HIGH (ref 70–99)
Glucose-Capillary: 222 mg/dL — ABNORMAL HIGH (ref 70–99)
Glucose-Capillary: 229 mg/dL — ABNORMAL HIGH (ref 70–99)
Glucose-Capillary: 249 mg/dL — ABNORMAL HIGH (ref 70–99)
Glucose-Capillary: 226 mg/dL — ABNORMAL HIGH (ref 70–99)

## 2020-09-20 LAB — LACTIC ACID, PLASMA: Lactic Acid, Venous: 2.5 mmol/L (ref 0.5–1.9)

## 2020-09-20 LAB — PHOSPHORUS: Phosphorus: 2.8 mg/dL (ref 2.5–4.6)

## 2020-09-20 LAB — URINE CULTURE: Culture: >=100000 — AB

## 2020-09-20 LAB — MAGNESIUM: Magnesium: 1.7 mg/dL (ref 1.7–2.4)

## 2020-09-20 MED ORDER — INSULIN GLARGINE 100 UNIT/ML ~~LOC~~ SOLN
22.0000 [IU] | Freq: Every day | SUBCUTANEOUS | Status: DC
  Administered 2020-09-20 – 2020-09-22 (×3): 22 [IU] via SUBCUTANEOUS
  Filled 2020-09-20 (×3): qty 0.22

## 2020-09-20 MED ORDER — SODIUM CHLORIDE 0.9 % IV SOLN: INTRAVENOUS | Status: DC

## 2020-09-20 NOTE — Evaluation (Signed)
Physical Therapy Evaluation Patient Details Name: Samantha Paul MRN: [id number] DOB: [birth year] Today's Date: 09/20/2020   History of Present Illness  Pt is a 74 y.o. female with PMH of DM2, HTN, chronic A. fib, CVA in August 2021 (with left-sided hemiplegia and expressive aphasia, dysphagia on PEG tube feeding and chronic Foley catheter), admitted from [facility] 09/18/20 for AMS and tachycardia. Workup for acute metabolic encephalopathy secondary to sepsis, UTI recent stroke. Head CT showed evolving infarct in R frontal region consistent with ACA and MCA distributions as seen in CT 07/2020.    Clinical Impression  Pt presents with an overall decrease in functional mobility secondary to above. PTA, pt has been receiving rehab at SNF since recent stroke 07/2020; prior to this, pt independent and living with husband. Today, pt requiring totalA for all aspects of mobility and ADLs; no active movement observed in LUE/LLE, pt not communicating or attending to L-side. Pt would benefit from continued acute PT services to maximize functional mobility prior to return to SNF for continued post-acute rehab.     Follow Up Recommendations SNF;Supervision/Assistance - 24 hour    Equipment Recommendations  None recommended by PT    Recommendations for Other Services       Precautions / Restrictions Precautions Precautions: Fall;Other (comment) Precaution Comments: L-side inattention and hemiplegia Restrictions Weight Bearing Restrictions: No      Mobility  Bed Mobility Overal bed mobility: Needs Assistance Bed Mobility: Rolling Rolling: Total assist;+2 for physical assistance         General bed mobility comments: TotalA to roll R/L for linen change and pericare, pt gripping bed rail with R hand when physically placed on rail to do so; totalA to scoot up in bed and left in chair position; significant R-lean with pillow placed for midline posture; pillow for LUE elevation and bilateral heel  offloading  Transfers                 General transfer comment: Will require totalA/maximove lift  Ambulation/Gait                Stairs            Wheelchair Mobility    Modified Rankin (Stroke Patients Only)       Balance Overall balance assessment: Needs assistance   Sitting balance-Leahy Scale: Zero Sitting balance - Comments: TotalA to flex neck and lean forward into long sitting for repositioning                                     Pertinent Vitals/Pain Pain Assessment: Faces Faces Pain Scale: Hurts a little bit Pain Location: Generalized, especially with oral care from RN Pain Descriptors / Indicators: Moaning;Grimacing Pain Intervention(s): Monitored during session;Repositioned    Home Living Family/patient expects to be discharged to:: Skilled nursing facility                      Prior Function Level of Independence: Needs assistance         Comments: Per chart, was independent prior to recent stroke in 07/2020 with d/c to SNF; per chart, working with PT at [facility]. Dependent for all aspects of care and mobility during today's evaluation, not communicating     Hand Dominance        Extremity/Trunk Assessment   Upper Extremity Assessment Upper Extremity Assessment: RUE deficits/detail;LUE deficits/detail;Difficult to assess due to impaired cognition RUE  Deficits / Details: Not following commands, but functional strength at least 3/5, including grip; did not observe much shoulder flex/abd LUE Deficits / Details: No active movement noted in LUE; significant hand/finger swelling    Lower Extremity Assessment Lower Extremity Assessment: RLE deficits/detail;LLE deficits/detail;Difficult to assess due to impaired cognition RLE Deficits / Details: Not following commands to move, functionally <3/5 strength observed bed level LLE Deficits / Details: No active movement noted in LLE       Communication    Communication: Prefers language other than English  Cognition Arousal/Alertness: Awake/alert Behavior During Therapy: Flat affect Overall Cognitive Status: No family/caregiver present to determine baseline cognitive functioning                                 General Comments: L-side inattention. Pt briefly attending to therapist and RN, but unable to recreate and does not scan past midline, did turn head to L-side 1x. Pt not speaking despite attempts to communicate in basic English, with gestures and phone interpreter. Eyes open and alert, but not participating in care. Husband not present      General Comments General comments (skin integrity, edema, etc.): RN/NT present to assist    Exercises     Assessment/Plan    PT Assessment Patient needs continued PT services  PT Problem List Decreased strength;Decreased range of motion;Decreased activity tolerance;Decreased balance;Decreased mobility;Decreased cognition;Decreased skin integrity       PT Treatment Interventions DME instruction;Functional mobility training;Therapeutic activities;Therapeutic exercise;Balance training;Neuromuscular re-education;Cognitive remediation;Patient/family education;Wheelchair mobility training    PT Goals (Current goals can be found in the Care Plan section)  Acute Rehab PT Goals PT Goal Formulation: Patient unable to participate in goal setting Time For Goal Achievement: 10/04/20 Potential to Achieve Goals: Poor    Frequency Min 2X/week   Barriers to discharge        Co-evaluation               AM-PAC PT "6 Clicks" Mobility  Outcome Measure Help needed turning from your back to your side while in a flat bed without using bedrails?: Total Help needed moving from lying on your back to sitting on the side of a flat bed without using bedrails?: Total Help needed moving to and from a bed to a chair (including a wheelchair)?: Total Help needed standing up from a chair using your  arms (e.g., wheelchair or bedside chair)?: Total Help needed to walk in hospital room?: Total Help needed climbing 3-5 steps with a railing? : Total 6 Click Score: 6    End of Session   Activity Tolerance: Treatment limited secondary to medical complications (Comment) Patient left: in bed;with call bell/phone within reach;with nursing/sitter in room Nurse Communication: Mobility status;Need for lift equipment PT Visit Diagnosis: Other abnormalities of gait and mobility (R26.89)    Time: 1660-6301 PT Time Calculation (min) (ACUTE ONLY): 21 min   Charges:   PT Evaluation $PT Eval Moderate Complexity: 1 Mod         Ina Homes, PT, DPT Acute Rehabilitation Services  Pager [phone] Office [phone]  Malachy Chamber 09/20/2020, 11:57 AM

## 2020-09-20 NOTE — Progress Notes (Signed)
First dose of Diltiazem 60 mg given via PEG tube, will discontinue Cardizem drip 1-2 hours later as per orders.  0030 Cardizem drip stopped at this time, vital signs stable,  will cont to monitor.

## 2020-09-20 NOTE — Progress Notes (Signed)
PROGRESS NOTE  Samantha Paul  DOB: Feb 25, 1946  PCP: Rometta Emery, MD [id number]  DOA: 09/18/2020  LOS: 2 days   Chief Complaint  Patient presents with  . Tachycardia  . Unresponsive   Brief narrative: Samantha Paul is a 74 y.o. female with PMH of DM2, HTN, chronic A. fib, CVA in August 2021 with left-sided hemiplegia and expressive aphasia, dysphagia on PEG tube feeding and chronic Foley catheter. Patient was sent to the ED on 09/18/2020 from SNF for altered mental status and tachycardia up to 190s.  Patient husband who visits her in the SNF. Over the last few weeks, she has been improving physically but for the last 2 to 3 days, has been noted progressive shortness of breath, generalized weakness and inability to participate with physical therapy. On the morning of 9/24, he found her weaker and minimally responsive.  Heart rate was elevated to 190s. EMS was called. In route to the hospital, she had episodes of apnea and was placed on nonrebreather.  In the ED, patient was arousable on sternal rub. Temperature 102.4, heart rate in 175, RR 27, blood pressure over 100, required 6 L oxygen to maintain oxygen saturation. Labs Labs showed potassium elevated to 5.3, glucose 212, albumin low at 1.8, lactic acid elevated to 2.2, WBC 12.8, hemoglobin 10.4. Respiratory virus panel including Covid PCR and influenza a and B PCR were negative. Chest x-ray unremarkable. Urinalysis with cloudy amber urine, large amount of leukocytes, rare bacteria The scan of head did not show any acute intracranial hemorrhage or mass-effect.  It showed evolving infarct in the right frontal region consistent with anterior cerebral and middle cerebral artery distributions as seen in CT scan from August.  Subjective: Patient was seen and examined this morning. Lying on bed.  Not in distress.  Husband not at bedside.  Patient at baseline is nonverbal and has language barrier. No fever in last 24 hours.  Heart rate in  the 70s improving, off Cardizem drip since last night. Blood glucose level this morning elevated to 262.  Hemoglobin at 8.6.  Assessment/Plan: Severe sepsis -POA  Urinary tract infection related to indwelling chronic Foley catheter -Brought from SNF with fever, tachypnea, leukocytosis, lactic acidosis -Urinalysis positive for pyuria.  -Culture and urine culture report pending.  Currently on IV cefepime.  Continue same. -Lactic acid still remains elevated with IV fluids.  Repeat lactic acid level tomorrow. -Currently on LR at 75 mill per hour.  Continue the same. -Monitor temperature trend, WBC and lactic acid.   Recent Labs  Lab 09/18/20 1747 09/18/20 1845 09/18/20 2145 09/19/20 0858 09/20/20 0218  WBC  --  12.8*  --  10.9* 8.3  LATICACIDVEN 2.8*  --  2.2*  --  2.5*  PROCALCITON  --   --   --  0.20  --    Acute metabolic encephalopathy -Multifactorial: Sepsis, UTI, recent stroke -CT head unremarkable for acute findings.   -Was minimally responsive in the ED.   -Mental status improving now. Continue to monitor mental status.  Chronic A. fib with RVR -Presented with RVR triggered by sepsis -Last echo from July 2021 showed EF of 65 to 70%. -Home meds include Cardizem 60 mg 4 times daily, metoprolol 50 mg twice daily, Eliquis. -Home meds resumed.  Cardizem drip was stopped last night.   -Continue Eliquis for CVA prevention.  Acute hypoxic respiratory  failure  -Episodes of apnea was noted by EMS and patient was started on nonrebreather.  -Chest x-ray normal.  Improving  oxygen requirement.  Currently on nasal cannula. -Wean down as tolerated.  Chronic diastolic CHF Essential hypertension -Not on diuretics at home. -Continue to monitor blood pressure and heart rate symptoms on Cardizem and metoprolol  Type 2 diabetes mellitus Hyperglycemia -A1c 8.7 on 07/27/2020 -Home meds include Lantus 22 units daily, Metformin 500 mg twice daily -Blood sugar level running elevated this  morning.  Increase Lantus to home dose of 22 units.  Continue sliding scale insulin. Recent Labs  Lab 09/19/20 1722 09/19/20 1937 09/19/20 2338 09/20/20 0329 09/20/20 0813  GLUCAP 228* 252* 296* 262* 243*   Hyperkalemia -Expect improvement with hydration. Recent Labs  Lab 09/18/20 2005 09/19/20 0858 09/20/20 0218  K 5.3* 4.3 3.9  MG  --   --  1.7  PHOS  --   --  2.8   Protein calorie malnutrition Hypoalbuminemia -Albumin level low at 1.8.  Nutrition consult  Hyperlipidemia -Continue Crestor  Dysphagia post PEG tube placement Aspiration precautions Dietary consult Resume PEG tube feeding.  Chronic urinary retention post indwelling Foley catheter -Foley catheter exchange in the ED.  Mobility: PT eval Code Status:   Code Status: Full Code  Nutritional status: Body mass index is 22.1 kg/m. Nutrition Problem: Inadequate oral intake Etiology: inability to eat Signs/Symptoms: NPO status Diet Order            Diet NPO time specified  Diet effective now                DVT prophylaxis:  apixaban (ELIQUIS) tablet 5 mg   Antimicrobials:  IV cefepime Fluid: Normal saline at 75 mL/h Consultants: None Family Communication:  Husband not at bedside this morning   Status is: Inpatient  Remains inpatient appropriate because:Hemodynamically unstable, Ongoing diagnostic testing needed not appropriate for outpatient work up and IV treatments appropriate due to intensity of illness or inability to take PO   Dispo:  Patient From: Skilled Nursing Facility  Planned Disposition: Skilled Nursing Facility  Expected discharge date: 09/22/20  Medically stable for discharge: No    Infusions:  . ceFEPime (MAXIPIME) IV 2 g (09/20/20 0554)  . diltiazem (CARDIZEM) infusion Stopped (09/20/20 0030)  . feeding supplement (JEVITY 1.2 CAL) 60 mL/hr at 09/19/20 1355  . lactated ringers 75 mL/hr at 09/20/20 0107    Scheduled Meds: . apixaban  5 mg Per Tube BID  .  bethanechol  10 mg Per Tube TID  . Chlorhexidine Gluconate Cloth  6 each Topical Daily  . diltiazem  60 mg Per Tube QID  . feeding supplement (PROSource TF)  45 mL Per Tube Daily  . ferrous sulfate  300 mg Per Tube TID  . free water  100 mL Per Tube Q8H  . insulin aspart  0-9 Units Subcutaneous Q4H  . insulin glargine  22 Units Subcutaneous Daily  . methylphenidate  5 mg Per Tube BID  . metoprolol tartrate  50 mg Per Tube BID  . rosuvastatin  20 mg Per Tube Daily  . senna  2 tablet Oral Daily  . sodium chloride flush  10-40 mL Intracatheter Q12H    Antimicrobials: Anti-infectives (From admission, onward)   Start     Dose/Rate Route Frequency Ordered Stop   09/19/20 2000  vancomycin (VANCOREADY) IVPB 750 mg/150 mL  Status:  Discontinued        750 mg 150 mL/hr over 60 Minutes Intravenous Every 24 hours 09/18/20 2125 09/19/20 0846   09/19/20 0800  ceFEPIme (MAXIPIME) 2 g in sodium chloride 0.9 % 100 mL  IVPB  Status:  Discontinued        2 g 200 mL/hr over 30 Minutes Intravenous Every 12 hours 09/18/20 2127 09/18/20 2131   09/19/20 0700  ceFEPIme (MAXIPIME) 2 g in sodium chloride 0.9 % 100 mL IVPB        2 g 200 mL/hr over 30 Minutes Intravenous Every 12 hours 09/18/20 2131     09/19/20 0600  metroNIDAZOLE (FLAGYL) IVPB 500 mg  Status:  Discontinued        500 mg 100 mL/hr over 60 Minutes Intravenous Every 8 hours 09/18/20 2323 09/19/20 0846   09/18/20 1800  ceFEPIme (MAXIPIME) 2 g in sodium chloride 0.9 % 100 mL IVPB        2 g 200 mL/hr over 30 Minutes Intravenous  Once 09/18/20 1748 09/18/20 1928   09/18/20 1800  metroNIDAZOLE (FLAGYL) IVPB 500 mg        500 mg 100 mL/hr over 60 Minutes Intravenous  Once 09/18/20 1748 09/18/20 2035   09/18/20 1800  vancomycin (VANCOCIN) IVPB 1000 mg/200 mL premix  Status:  Discontinued        1,000 mg 200 mL/hr over 60 Minutes Intravenous  Once 09/18/20 1748 09/18/20 1756   09/18/20 1800  vancomycin (VANCOREADY) IVPB 1250 mg/250 mL         1,250 mg 166.7 mL/hr over 90 Minutes Intravenous  Once 09/18/20 1756 09/18/20 2147      PRN meds: acetaminophen, sodium chloride flush   Objective: Vitals:   09/20/20 0435 09/20/20 0810  BP: 123/80   Pulse: 74   Resp: (!) 25   Temp:  (!) 86 F (30 C)  SpO2: 98%     Intake/Output Summary (Last 24 hours) at 09/20/2020 1044 Last data filed at 09/20/2020 0500 Gross per 24 hour  Intake 1608.92 ml  Output 1150 ml  Net 458.92 ml   Filed Weights   09/18/20 1750  Weight: 54.8 kg   Weight change:  Body mass index is 22.1 kg/m.   Physical Exam: General exam: Appears calm and comfortable. Not in physical distress Skin: No rashes, lesions or ulcers. HEENT: Atraumatic, normocephalic, supple neck, no obvious bleeding Lungs: Clear to auscultation bilaterally CVS: Irregular, rate controlled, no murmur GI/Abd soft, nontender, nondistended, bowel sound present. PEG tube site intact. CNS: Alert, awake, nonverbal after last stroke Psychiatry: Depressed look Extremities: No pedal edema, no calf tenderness  Data Review: I have personally reviewed the laboratory data and studies available.  Recent Labs  Lab 09/18/20 1845 09/19/20 0858 09/20/20 0218  WBC 12.8* 10.9* 8.3  NEUTROABS 9.8* 7.5 5.9  HGB 10.4* 8.8* 8.6*  HCT 36.5 30.3* 29.5*  MCV 83.0 81.5 80.2  PLT 189 161 131*   Recent Labs  Lab 09/18/20 2005 09/19/20 0858 09/20/20 0218  NA 139 138 133*  K 5.3* 4.3 3.9  CL 106 104 101  CO2 25 22 23   GLUCOSE 212* 179* 301*  BUN 36* 32* 34*  CREATININE 1.06* 0.91 0.80  CALCIUM 7.9* 8.3* 8.0*  MG  --   --  1.7  PHOS  --   --  2.8   F/u labs ordered.  Signed, , MD [facility] 09/20/2020

## 2020-09-21 LAB — GLUCOSE, CAPILLARY
Glucose-Capillary: 184 mg/dL — ABNORMAL HIGH (ref 70–99)
Glucose-Capillary: 187 mg/dL — ABNORMAL HIGH (ref 70–99)
Glucose-Capillary: 196 mg/dL — ABNORMAL HIGH (ref 70–99)
Glucose-Capillary: 210 mg/dL — ABNORMAL HIGH (ref 70–99)
Glucose-Capillary: 220 mg/dL — ABNORMAL HIGH (ref 70–99)

## 2020-09-21 LAB — CBC WITH DIFFERENTIAL/PLATELET
Abs Immature Granulocytes: 0.03 10*3/uL (ref 0.00–0.07)
Basophils Absolute: 0 10*3/uL (ref 0.0–0.1)
Basophils Relative: 0 %
Eosinophils Absolute: 0.3 10*3/uL (ref 0.0–0.5)
Eosinophils Relative: 3 %
HCT: 28.6 % — ABNORMAL LOW (ref 36.0–46.0)
Hemoglobin: 8.4 g/dL — ABNORMAL LOW (ref 12.0–15.0)
Immature Granulocytes: 0 %
Lymphocytes Relative: 14 %
Lymphs Abs: 1.3 10*3/uL (ref 0.7–4.0)
MCH: 23.7 pg — ABNORMAL LOW (ref 26.0–34.0)
MCHC: 29.4 g/dL — ABNORMAL LOW (ref 30.0–36.0)
MCV: 80.6 fL (ref 80.0–100.0)
Monocytes Absolute: 0.7 10*3/uL (ref 0.1–1.0)
Monocytes Relative: 8 %
Neutro Abs: 6.4 10*3/uL (ref 1.7–7.7)
Neutrophils Relative %: 75 %
Platelets: 127 10*3/uL — ABNORMAL LOW (ref 150–400)
RBC: 3.55 MIL/uL — ABNORMAL LOW (ref 3.87–5.11)
RDW: 17.6 % — ABNORMAL HIGH (ref 11.5–15.5)
WBC: 8.7 10*3/uL (ref 4.0–10.5)
nRBC: 0.3 % — ABNORMAL HIGH (ref 0.0–0.2)

## 2020-09-21 LAB — MAGNESIUM: Magnesium: 1.7 mg/dL (ref 1.7–2.4)

## 2020-09-21 LAB — BASIC METABOLIC PANEL
Anion gap: 7 (ref 5–15)
BUN: 24 mg/dL — ABNORMAL HIGH (ref 8–23)
CO2: 24 mmol/L (ref 22–32)
Calcium: 7.9 mg/dL — ABNORMAL LOW (ref 8.9–10.3)
Chloride: 101 mmol/L (ref 98–111)
Creatinine, Ser: 0.65 mg/dL (ref 0.44–1.00)
GFR calc Af Amer: >60 mL/min (ref >60)
GFR calc non Af Amer: >60 mL/min (ref >60)
Glucose, Bld: 212 mg/dL — ABNORMAL HIGH (ref 70–99)
Potassium: 4.1 mmol/L (ref 3.5–5.1)
Sodium: 132 mmol/L — ABNORMAL LOW (ref 135–145)

## 2020-09-21 LAB — PHOSPHORUS: Phosphorus: 2.6 mg/dL (ref 2.5–4.6)

## 2020-09-21 MED ORDER — METFORMIN HCL 500 MG PO TABS
500.0000 mg | ORAL_TABLET | Freq: Two times a day (BID) | ORAL | Status: DC
  Administered 2020-09-21 – 2020-09-22 (×2): 500 mg via ORAL
  Filled 2020-09-21 (×2): qty 1

## 2020-09-21 MED ORDER — CEFDINIR 300 MG PO CAPS
300.0000 mg | ORAL_CAPSULE | Freq: Two times a day (BID) | ORAL | Status: DC
  Administered 2020-09-21 – 2020-09-22 (×3): 300 mg via ORAL
  Filled 2020-09-21 (×4): qty 1

## 2020-09-21 NOTE — Progress Notes (Signed)
PROGRESS NOTE  Samantha Paul  DOB: 06-23-1946  PCP: Rometta Emery, MD ERX:[id number]  DOA: 09/18/2020  LOS: 3 days   Chief Complaint  Patient presents with  . Tachycardia  . Unresponsive   Brief narrative: Samantha Paul is a 74 y.o. female with PMH of DM2, HTN, chronic A. fib, CVA in August 2021 with left-sided hemiplegia and expressive aphasia, dysphagia on PEG tube feeding and chronic Foley catheter. Patient was sent to the ED on 09/18/2020 from SNF for altered mental status and tachycardia up to 190s.  Patient husband who visits her in the SNF. Over the last few weeks, she has been improving physically but for the last 2 to 3 days, has been noted progressive shortness of breath, generalized weakness and inability to participate with physical therapy. On the morning of 9/24, he found her weaker and minimally responsive.  Heart rate was elevated to 190s. EMS was called. In route to the hospital, she had episodes of apnea and was placed on nonrebreather.  In the ED, patient was arousable on sternal rub. Temperature 102.4, heart rate in 175, RR 27, blood pressure over 100, required 6 L oxygen to maintain oxygen saturation. Labs Labs showed potassium elevated to 5.3, glucose 212, albumin low at 1.8, lactic acid elevated to 2.2, WBC 12.8, hemoglobin 10.4. Respiratory virus panel including Covid PCR and influenza a and B PCR were negative. Chest x-ray unremarkable. Urinalysis with cloudy amber urine, large amount of leukocytes, rare bacteria The scan of head did not show any acute intracranial hemorrhage or mass-effect.  It showed evolving infarct in the right frontal region consistent with anterior cerebral and middle cerebral artery distributions as seen in CT scan from August.  Subjective: Patient was seen and examined this morning. Husband at bedside.  Patient is alert, awake, she is able to follow some commands by the husband.  Nonverbal. No fever.  Hemodynamically stabilized.  Off  Cardizem drip.  Assessment/Plan: Severe sepsis -POA  Secondary to tract infection related to indwelling chronic Foley catheter -Brought from SNF with fever, tachypnea, leukocytosis, lactic acidosis -Urinalysis positive for pyuria.  -Urine culture positive for more than 100,000 CFU per mL of yeast -Patient has been improving without antifungal needs on broad-spectrum antibiotics.  -I think the fungal growth is not the cause of sepsis.  We will switch her to oral antibiotics. -Stop IV fluid. -Monitor temperature trend, WBC and lactic acid.   Recent Labs  Lab 09/18/20 1747 09/18/20 1845 09/18/20 2145 09/19/20 0858 09/20/20 0218 09/21/20 0500  WBC  --  12.8*  --  10.9* 8.3 8.7  LATICACIDVEN 2.8*  --  2.2*  --  2.5*  --   PROCALCITON  --   --   --  0.20  --   --    Acute metabolic encephalopathy -Multifactorial: Sepsis, UTI, recent stroke -CT head unremarkable for acute findings.   -Patient was minimally responsive in the ED.   -Mental status improving now, back to baseline. Continue to monitor mental status.  Chronic A. fib with RVR -Presented with RVR triggered by sepsis -Last echo from July 2021 showed EF of 65 to 70%. -Initially required Cardizem drip.  Heart rate is now better after resumption of home meds which include cardizem 60 mg 4 times daily, metoprolol 50 mg twice daily.  -Continue Eliquis for CVA prevention.  Acute hypoxic respiratory  failure  -Episodes of apnea was noted by EMS and patient was initially started on nonrebreather.  -Chest x-ray normal.  Improving oxygen requirement.  Currently on room air.  Chronic diastolic CHF Essential hypertension -Not on diuretics at home. -Continue to monitor blood pressure and heart rate on Cardizem and metoprolol  Type 2 diabetes mellitus Hyperglycemia -A1c 8.7 on 07/27/2020 -Home meds include Lantus 22 units daily, Metformin 500 mg twice daily. -Continue same dose of Lantus, resume Metformin.  Continue sliding-scale  insulin with Accu-Cheks. Recent Labs  Lab 09/20/20 1927 09/21/20 0008 09/21/20 0347 09/21/20 0728 09/21/20 1209  GLUCAP 226* 196* 187* 184* 210*   Hyperkalemia -Expect improvement with hydration. Recent Labs  Lab 09/18/20 2005 09/19/20 0858 09/20/20 0218 09/21/20 0500  K 5.3* 4.3 3.9 4.1  MG  --   --  1.7 1.7  PHOS  --   --  2.8 2.6   Protein calorie malnutrition Hypoalbuminemia -Albumin level low at 1.8.  Nutrition consult  Hyperlipidemia -Continue Crestor  Dysphagia post PEG tube placement Aspiration precautions Dietary consult Resume PEG tube feeding.  Chronic urinary retention post indwelling Foley catheter -Foley catheter exchange in the ED.  Mobility: PT eval Code Status:   Code Status: Full Code  Nutritional status: Body mass index is 22.1 kg/m. Nutrition Problem: Inadequate oral intake Etiology: inability to eat Signs/Symptoms: NPO status Diet Order            Diet NPO time specified  Diet effective now                DVT prophylaxis:  apixaban (ELIQUIS) tablet 5 mg   Antimicrobials:  Oral Omnicef Fluid: Okay to stop fluid Consultants: None Family Communication:  Husband not at bedside this morning   Status is: Inpatient  Remains inpatient appropriate because : Monitor on oral pills.  Dispo:  Patient From: Skilled Nursing Facility  Planned Disposition: Skilled Nursing Facility  Expected discharge date: 09/22/20  Medically stable for discharge: No    Infusions:  . ceFEPime (MAXIPIME) IV 2 g (09/21/20 0816)  . diltiazem (CARDIZEM) infusion Stopped (09/20/20 0030)  . feeding supplement (JEVITY 1.2 CAL) 60 mL/hr at 09/19/20 1355    Scheduled Meds: . apixaban  5 mg Per Tube BID  . bethanechol  10 mg Per Tube TID  . Chlorhexidine Gluconate Cloth  6 each Topical Daily  . diltiazem  60 mg Per Tube QID  . feeding supplement (PROSource TF)  45 mL Per Tube Daily  . ferrous sulfate  300 mg Per Tube TID  . free water  100 mL Per  Tube Q8H  . insulin aspart  0-9 Units Subcutaneous Q4H  . insulin glargine  22 Units Subcutaneous Daily  . methylphenidate  5 mg Per Tube BID  . metoprolol tartrate  50 mg Per Tube BID  . rosuvastatin  20 mg Per Tube Daily  . senna  2 tablet Oral Daily  . sodium chloride flush  10-40 mL Intracatheter Q12H    Antimicrobials: Anti-infectives (From admission, onward)   Start     Dose/Rate Route Frequency Ordered Stop   09/19/20 2000  vancomycin (VANCOREADY) IVPB 750 mg/150 mL  Status:  Discontinued        750 mg 150 mL/hr over 60 Minutes Intravenous Every 24 hours 09/18/20 2125 09/19/20 0846   09/19/20 0800  ceFEPIme (MAXIPIME) 2 g in sodium chloride 0.9 % 100 mL IVPB  Status:  Discontinued        2 g 200 mL/hr over 30 Minutes Intravenous Every 12 hours 09/18/20 2127 09/18/20 2131   09/19/20 0700  ceFEPIme (MAXIPIME) 2 g in sodium chloride 0.9 % 100  mL IVPB        2 g 200 mL/hr over 30 Minutes Intravenous Every 12 hours 09/18/20 2131     09/19/20 0600  metroNIDAZOLE (FLAGYL) IVPB 500 mg  Status:  Discontinued        500 mg 100 mL/hr over 60 Minutes Intravenous Every 8 hours 09/18/20 2323 09/19/20 0846   09/18/20 1800  ceFEPIme (MAXIPIME) 2 g in sodium chloride 0.9 % 100 mL IVPB        2 g 200 mL/hr over 30 Minutes Intravenous  Once 09/18/20 1748 09/18/20 1928   09/18/20 1800  metroNIDAZOLE (FLAGYL) IVPB 500 mg        500 mg 100 mL/hr over 60 Minutes Intravenous  Once 09/18/20 1748 09/18/20 2035   09/18/20 1800  vancomycin (VANCOCIN) IVPB 1000 mg/200 mL premix  Status:  Discontinued        1,000 mg 200 mL/hr over 60 Minutes Intravenous  Once 09/18/20 1748 09/18/20 1756   09/18/20 1800  vancomycin (VANCOREADY) IVPB 1250 mg/250 mL        1,250 mg 166.7 mL/hr over 90 Minutes Intravenous  Once 09/18/20 1756 09/18/20 2147      PRN meds: acetaminophen, sodium chloride flush   Objective: Vitals:   09/21/20 0800 09/21/20 1213  BP:  (!) 132/92  Pulse:  92  Resp: 17 (!) 28  Temp:   97.8 F (36.6 C)  SpO2: 100% 100%    Intake/Output Summary (Last 24 hours) at 09/21/2020 1349 Last data filed at 09/21/2020 6962 Gross per 24 hour  Intake 2292.5 ml  Output 1400 ml  Net 892.5 ml   Filed Weights   09/18/20 1750  Weight: 54.8 kg   Weight change:  Body mass index is 22.1 kg/m.   Physical Exam: General exam: Appears calm and comfortable. Not in physical distress.  Chronic Foley catheter.  Chronic PEG tube. Skin: No rashes, lesions or ulcers. HEENT: Atraumatic, normocephalic, supple neck, no obvious bleeding Lungs: Clear to auscultation bilaterally CVS: Irregular, rate controlled, no murmur GI/Abd soft, nontender, nondistended, bowel sound present. PEG tube site intact. CNS: Alert, awake, nonverbal after last stroke Psychiatry: Depressed look Extremities: No pedal edema, no calf tenderness  Data Review: I have personally reviewed the laboratory data and studies available.  Recent Labs  Lab 09/18/20 1845 09/19/20 0858 09/20/20 0218 09/21/20 0500  WBC 12.8* 10.9* 8.3 8.7  NEUTROABS 9.8* 7.5 5.9 6.4  HGB 10.4* 8.8* 8.6* 8.4*  HCT 36.5 30.3* 29.5* 28.6*  MCV 83.0 81.5 80.2 80.6  PLT 189 161 131* 127*   Recent Labs  Lab 09/18/20 2005 09/19/20 0858 09/20/20 0218 09/21/20 0500  NA 139 138 133* 132*  K 5.3* 4.3 3.9 4.1  CL 106 104 101 101  CO2 25 22 23 24   GLUCOSE 212* 179* 301* 212*  BUN 36* 32* 34* 24*  CREATININE 1.06* 0.91 0.80 0.65  CALCIUM 7.9* 8.3* 8.0* 7.9*  MG  --   --  1.7 1.7  PHOS  --   --  2.8 2.6   F/u labs ordered.  Signed, , MD [facility] 09/21/2020

## 2020-09-21 NOTE — TOC Initial Note (Signed)
Transition of Care [facility]) - Initial/Assessment Note    Patient Details  Name: Samantha Paul MRN: [id number] Date of Birth: [birth year]  [facility]) CM/SW Contact:    Joanne Chars, LCSW Phone Number: 09/21/2020, 3:52 PM  Clinical Narrative:      CSW attempted to meet with pt who was not responsive, met with husband later.  Husband spoke some english but said he had medicare questions and requested interpretor.  CSW attempted translator through [employer] and [employer] and both reported they were unable to provide interpretation for [place].  Husband aware that pt scheduled for DC tomorrow back to [facility].  Pt is vaccinated.   Juliann Pulse at [facility] care informed.            Expected Discharge Plan: Skilled Nursing Facility Barriers to Discharge: Continued Medical Work up   Patient Goals and CMS Choice        Expected Discharge Plan and Services Expected Discharge Plan: [place] Choice: [place] Living arrangements for the past 2 months: [place]                                      Prior Living Arrangements/Services Living arrangements for the past 2 months: [place] Lives with:: Facility Resident Patient language and need for interpreter reviewed:: Yes (husband speaks some english but requests interpretor, [employer] attempted but did not have capability in this language)        Need for Family Participation in Patient Care: Yes (Comment) Care giver support system in place?: Yes (comment) (husband)   Criminal Activity/Legal Involvement Pertinent to Current Situation/Hospitalization: No - Comment as needed  Activities of Daily Living      Permission Sought/Granted                  Emotional Assessment Appearance:: Appears stated age Attitude/Demeanor/Rapport: Unable to Assess Affect (typically observed): Unable to  Assess Orientation: : Oriented to Self Alcohol / Substance Use: Not Applicable Psych Involvement: No (comment)  Admission diagnosis:  Atrial fibrillation with RVR ([place]) [I48.91] Sepsis ([place]) [A41.9] Sepsis without acute organ dysfunction, due to unspecified organism [facility]) [A41.9] Patient Active Problem List   Diagnosis Date Noted  . Dysphagia due to recent cerebrovascular accident   . Acute pyelonephritis 08/17/2020  . C. difficile colitis 08/16/2020  . Palliative care by specialist   . DNR (do not resuscitate) discussion   . Acute respiratory failure with hypoxia ([place])   . Stroke ([place]) 07/26/2020  . Stroke due to embolism ([place]) 07/26/2020  . Middle cerebral artery embolism, right 07/26/2020  . Sepsis ([place]) 06/29/2020  . DKA (diabetic ketoacidoses) ([place]) 06/29/2020  . ARF (acute renal failure) ([place]) 06/29/2020  . Hemiparesis affecting right side as late effect of stroke ([place])   . Acute blood loss anemia   . Left pontine stroke ([place]) 07/13/2018  . Essential hypertension   . Diabetes mellitus type 2 in nonobese (HCC)   . CVA (cerebral vascular accident) ([place]) 07/10/2018  . Hypertensive urgency 07/10/2018  . Hyperglycemia 07/10/2018  . Weakness generalized 07/10/2018  . Hyperlipidemia    PCP:  Elwyn Reach, MD Pharmacy:   [facility] [place] [address] [place] [address] Phone: [phone]  Fax: [phone]  [facility] [place] [place] - [address] [place] [address] Phone: [phone] Fax: [phone]     Social Determinants of Health (SDOH) Interventions    Readmission Risk Interventions Readmission Risk Prevention Plan 07/03/2020 07/03/2020  Post Dischage Appt Complete -  Medication Screening Complete Complete  Transportation Screening Complete Complete

## 2020-09-22 LAB — GLUCOSE, CAPILLARY
Glucose-Capillary: 171 mg/dL — ABNORMAL HIGH (ref 70–99)
Glucose-Capillary: 191 mg/dL — ABNORMAL HIGH (ref 70–99)
Glucose-Capillary: 211 mg/dL — ABNORMAL HIGH (ref 70–99)
Glucose-Capillary: 218 mg/dL — ABNORMAL HIGH (ref 70–99)
Glucose-Capillary: 222 mg/dL — ABNORMAL HIGH (ref 70–99)

## 2020-09-22 LAB — CBC WITH DIFFERENTIAL/PLATELET
Abs Immature Granulocytes: 0.06 10*3/uL (ref 0.00–0.07)
Basophils Absolute: 0 10*3/uL (ref 0.0–0.1)
Basophils Relative: 1 %
Eosinophils Absolute: 0.3 10*3/uL (ref 0.0–0.5)
Eosinophils Relative: 4 %
HCT: 31 % — ABNORMAL LOW (ref 36.0–46.0)
Hemoglobin: 9.1 g/dL — ABNORMAL LOW (ref 12.0–15.0)
Immature Granulocytes: 1 %
Lymphocytes Relative: 20 %
Lymphs Abs: 1.5 10*3/uL (ref 0.7–4.0)
MCH: 23.7 pg — ABNORMAL LOW (ref 26.0–34.0)
MCHC: 29.4 g/dL — ABNORMAL LOW (ref 30.0–36.0)
MCV: 80.7 fL (ref 80.0–100.0)
Monocytes Absolute: 0.6 10*3/uL (ref 0.1–1.0)
Monocytes Relative: 9 %
Neutro Abs: 4.7 10*3/uL (ref 1.7–7.7)
Neutrophils Relative %: 65 %
Platelets: 137 10*3/uL — ABNORMAL LOW (ref 150–400)
RBC: 3.84 MIL/uL — ABNORMAL LOW (ref 3.87–5.11)
RDW: 17.9 % — ABNORMAL HIGH (ref 11.5–15.5)
WBC: 7.2 10*3/uL (ref 4.0–10.5)
nRBC: 0.4 % — ABNORMAL HIGH (ref 0.0–0.2)

## 2020-09-22 LAB — RESPIRATORY PANEL BY RT PCR (FLU A&B, COVID)
Influenza A by PCR: NEGATIVE
Influenza B by PCR: NEGATIVE
SARS Coronavirus 2 by RT PCR: NEGATIVE

## 2020-09-22 LAB — BASIC METABOLIC PANEL
Anion gap: 7 (ref 5–15)
BUN: 21 mg/dL (ref 8–23)
CO2: 23 mmol/L (ref 22–32)
Calcium: 8.2 mg/dL — ABNORMAL LOW (ref 8.9–10.3)
Chloride: 104 mmol/L (ref 98–111)
Creatinine, Ser: 0.77 mg/dL (ref 0.44–1.00)
GFR calc Af Amer: >60 mL/min (ref >60)
GFR calc non Af Amer: >60 mL/min (ref >60)
Glucose, Bld: 193 mg/dL — ABNORMAL HIGH (ref 70–99)
Potassium: 4.1 mmol/L (ref 3.5–5.1)
Sodium: 134 mmol/L — ABNORMAL LOW (ref 135–145)

## 2020-09-22 LAB — LACTIC ACID, PLASMA: Lactic Acid, Venous: 1.6 mmol/L (ref 0.5–1.9)

## 2020-09-22 MED ORDER — INSULIN GLARGINE 100 UNIT/ML ~~LOC~~ SOLN: 30.0000 [IU] | Freq: Every day | SUBCUTANEOUS | Status: DC

## 2020-09-22 MED ORDER — CEFDINIR 300 MG PO CAPS: 300.0000 mg | ORAL_CAPSULE | Freq: Two times a day (BID) | ORAL | 0 refills | Status: AC

## 2020-09-22 MED ORDER — SACCHAROMYCES BOULARDII 250 MG PO CAPS: 250.0000 mg | ORAL_CAPSULE | Freq: Two times a day (BID) | ORAL | Status: AC

## 2020-09-22 MED ORDER — INSULIN GLARGINE 100 UNIT/ML ~~LOC~~ SOLN: 22.0000 [IU] | Freq: Every day | SUBCUTANEOUS | 11 refills | Status: DC

## 2020-09-22 NOTE — Discharge Summary (Signed)
Physician Discharge Summary  Samantha Paul RKY:[id number] DOB: 05-12-1946 DOA: 09/18/2020  PCP: Elwyn Reach, MD  Admit date: 09/18/2020 Discharge date: 09/22/2020  Admitted From: SNF Discharge disposition: SNF   Code Status: Full Code  Diet Recommendation: Tube feeding  Discharge Diagnosis:   Active Problems:   Sepsis ([place])    History of Present Illness / Brief narrative:  Samantha Paul is a 74 y.o. female with PMH of DM2, HTN, chronic A. fib, CVA in August 2021 with left-sided hemiplegia and expressive aphasia, dysphagia on PEG tube feeding and chronic Foley catheter. Patient was sent to the ED on 09/18/2020 from SNF for altered mental status and tachycardia up to 190s.  Patient husband who visits her in the SNF. Over the last few weeks, she has been improving physically but for the last 2 to 3 days, has been noted progressive shortness of breath, generalized weakness and inability to participate with physical therapy. On the morning of 9/24, he found her weaker and minimally responsive.  Heart rate was elevated to 190s. EMS was called. In route to the hospital, she had episodes of apnea and was placed on nonrebreather.  In the ED, patient was arousable on sternal rub. Temperature 102.4, heart rate in 175, RR 27, blood pressure over 100, required 6 L oxygen to maintain oxygen saturation. Labs Labs showed potassium elevated to 5.3, glucose 212, albumin low at 1.8, lactic acid elevated to 2.2, WBC 12.8, hemoglobin 10.4. Respiratory virus panel including Covid PCR and influenza a and B PCR were negative. Chest x-ray unremarkable. Urinalysis with cloudy amber urine, large amount of leukocytes, rare bacteria The scan of head did not show any acute intracranial hemorrhage or mass-effect.  It showed evolving infarct in the right frontal region consistent with anterior cerebral and middle cerebral artery distributions as seen in CT scan from August.  Subjective:  Seen and examined this  morning.  Alert, awake.  Nonverbal at baseline.  Husband at bedside. Interpreter at bedside.  Answered questions to satisfaction  Hospital Course:  Severe sepsis -POA  Secondary to tract infection related to indwelling chronic Foley catheter -Brought from SNF with fever, tachypnea, leukocytosis, lactic acidosis -Urinalysis positive for pyuria.  -Urine culture positive for more than 100,000 CFU per mL of yeast -Patient has been improving without antifungal needs on broad-spectrum antibiotics.  -I think the fungal growth is not the cause of sepsis.    Her antibiotics were switched to oral. -Discharged on 3 more days of oral antibiotics with probiotics. Recent Labs  Lab 09/18/20 1747 09/18/20 1845 09/18/20 2145 09/19/20 0858 09/20/20 0218 09/21/20 0500 09/22/20 0338  WBC  --  12.8*  --  10.9* 8.3 8.7 7.2  LATICACIDVEN 2.8*  --  2.2*  --  2.5*  --  1.6  PROCALCITON  --   --   --  0.20  --   --   --    Acute metabolic encephalopathy -Multifactorial: Sepsis, UTI, recent stroke -CT head unremarkable for acute findings.   -Patient was minimally responsive in the ED.   -Mental status improving now, back to baseline.  After the recent stroke, at baseline patient is nonverbal and only able to follow some commands.  Chronic A. fib with RVR -Presented with RVR triggered by sepsis -Last echo from July 2021 showed EF of 65 to 70%. -Initially required Cardizem drip.  Heart rate is now better after resumption of home meds which include cardizem 60 mg 4 times daily, metoprolol 50 mg twice daily.  -  Continue Eliquis for CVA prevention.  Acute hypoxic respiratory failure  -Episodes of apnea was noted by EMS and patient was initially started on nonrebreather.  -Chest x-ray normal.  Improving oxygen requirement.  Currently on room air.  Chronic diastolic CHF Essential hypertension -Not on diuretics at home. -Continue to monitor blood pressure and heart rate on Cardizem and  metoprolol  Type 2 diabetes mellitus Hyperglycemia -A1c8.7 on 07/27/2020 -Home meds include Farxiga, Lantus 22 units daily, Metformin 500 mg twice daily. -Resume the same post discharge.  Hyperkalemia -Improved with hydration. Recent Labs  Lab 09/18/20 2005 09/19/20 0858 09/20/20 0218 09/21/20 0500 09/22/20 0338  K 5.3* 4.3 3.9 4.1 4.1  MG  --   --  1.7 1.7  --   PHOS  --   --  2.8 2.6  --    Protein calorie malnutrition Hypoalbuminemia -Albumin level low at 1.8.  Nutrition consult obtained.  Hyperlipidemia -Continue Crestor  Dysphagia post PEG tube placement Aspiration precautions Dietary consult Continue PEG tube feeding.  Chronic urinary retention post indwelling Foley catheter -Foley catheter exchange in the ED.  Stable for discharge back to SNF today.  Wound care: Incision (Closed) 06/30/20 Abdomen Other (Comment) (Active)  Date First Assessed/Time First Assessed: 06/30/20 1225   Location: Abdomen  Location Orientation: Other (Comment)    Assessments 06/30/2020  1:33 PM 07/03/2020  8:00 AM  Dressing Type Liquid skin adhesive Liquid skin adhesive  Dressing Clean;Dry;Intact Clean;Dry;Intact  Site / Wound Assessment Clean;Dry Clean;Dry  Margins Attached edges (approximated) Attached edges (approximated)  Drainage Amount None None     No Linked orders to display    Discharge Exam:   Vitals:   09/22/20 0050 09/22/20 0349 09/22/20 0534 09/22/20 0847  BP:  133/89 125/80 127/85  Pulse: 88 85 96 88  Resp: (!) 24 (!) 26 (!) 24 (!) 28  Temp:  98.1 F (36.7 C) 97.8 F (36.6 C) 97.6 F (36.4 C)  TempSrc:  Axillary Axillary Oral  SpO2: 100% 100% 99% 100%  Weight:      Height:        Body mass index is 22.1 kg/m.  General exam: Appears calm and comfortable.  Not in physical distress Skin: No rashes, lesions or ulcers. HEENT: Atraumatic, normocephalic, supple neck, no obvious bleeding Lungs: Clear to auscultation bilaterally CVS: Regular rate and  rhythm, no murmur GI/Abd soft, nontender, nondistended, bowel sound present, PEG tube site intact CNS: Alert, awake, baseline nonverbal, able to follow some motor commands on the right when asked by husband Psychiatry: Mood appropriate Extremities: No pedal edema, no calf tenderness  Follow ups:   Discharge Instructions    Increase activity slowly   Complete by: As directed       Follow-up Information    Elwyn Reach, MD Follow up.   Specialty: Internal Medicine Contact information: [address] [place] [address] [phone]               Recommendations for Outpatient Follow-Up:   1. Follow-up with PCP as an outpatient   Discharge Instructions:  Follow with Primary MD Elwyn Reach, MD in 7 days   Get CBC/BMP checked in next visit within 1 week by PCP or SNF MD ( we routinely change or add medications that can affect your baseline labs and fluid status, therefore we recommend that you get the mentioned basic workup next visit with your PCP, your PCP may decide not to get them or add new tests based on their  clinical decision)  On your next visit with your PCP, please Get Medicines reviewed and adjusted.  Please request your PCP  to go over all Hospital Tests and Procedure/Radiological results at the follow up, please get all Hospital records sent to your Prim MD by signing hospital release before you go home.  Activity: As tolerated with Full fall precautions use walker/cane & assistance as needed  For Heart failure patients - Check your Weight same time everyday, if you gain over 2 pounds, or you develop in leg swelling, experience more shortness of breath or chest pain, call your Primary MD immediately. Follow Cardiac Low Salt Diet and 1.5 lit/day fluid restriction.  If you have smoked or chewed Tobacco in the last 2 yrs please stop smoking, stop any regular Alcohol  and or any Recreational drug use.  If you experience worsening of your  admission symptoms, develop shortness of breath, life threatening emergency, suicidal or homicidal thoughts you must seek medical attention immediately by calling 911 or calling your MD immediately  if symptoms less severe.  You Must read complete instructions/literature along with all the possible adverse reactions/side effects for all the Medicines you take and that have been prescribed to you. Take any new Medicines after you have completely understood and accpet all the possible adverse reactions/side effects.   Do not drive, operate heavy machinery, perform activities at heights, swimming or participation in water activities or provide baby sitting services if your were admitted for syncope or siezures until you have seen by Primary MD or a Neurologist and advised to do so again.  Do not drive when taking Pain medications.  Do not take more than prescribed Pain, Sleep and Anxiety Medications  Wear Seat belts while driving.   Please note You were cared for by a hospitalist during your hospital stay. If you have any questions about your discharge medications or the care you received while you were in the hospital after you are discharged, you can call the unit and asked to speak with the hospitalist on call if the hospitalist that took care of you is not available. Once you are discharged, your primary care physician will handle any further medical issues. Please note that NO REFILLS for any discharge medications will be authorized once you are discharged, as it is imperative that you return to your primary care physician (or establish a relationship with a primary care physician if you do not have one) for your aftercare needs so that they can reassess your need for medications and monitor your lab values.    Allergies as of 09/22/2020      Reactions   Metformin And Related Diarrhea   Abdominal pain and diarrhea      Medication List    TAKE these medications   acetaminophen 325 MG  tablet Commonly known as: TYLENOL Place 650 mg into feeding tube every 8 (eight) hours as needed for moderate pain.   acetaminophen 160 MG/5ML solution Commonly known as: TYLENOL Place 20.3 mLs (650 mg total) into feeding tube every 4 (four) hours as needed for mild pain (or temp > 37.5 C (99.5 F)).   apixaban 5 MG Tabs tablet Commonly known as: ELIQUIS Take 1 tablet (5 mg total) by mouth 2 (two) times daily.   bethanechol 10 MG tablet Commonly known as: URECHOLINE Place 1 tablet (10 mg total) into feeding tube 3 (three) times daily.   blood glucose meter kit and supplies Kit Dispense based on patient and insurance preference. Use up  to four times daily as directed. (FOR ICD-10--E11.9).   cefdinir 300 MG capsule Commonly known as: OMNICEF Take 1 capsule (300 mg total) by mouth every 12 (twelve) hours for 3 days.   diltiazem 60 MG tablet Commonly known as: CARDIZEM Place 60 mg into feeding tube 4 (four) times daily.   feeding supplement (JEVITY 1.2 CAL) Liqd Place 1,000 mLs into feeding tube continuous.   ferrous sulfate 300 (60 Fe) MG/5ML syrup Place 5 mLs (300 mg total) into feeding tube 3 (three) times daily.   free water Soln Place 100 mLs into feeding tube every 8 (eight) hours.   insulin aspart 100 UNIT/ML injection Commonly known as: novoLOG CBG 70 - 120: 0 units CBG 121 - 150: 1 unit CBG 151 - 200: 2 units CBG 201 - 250: 3 units CBG 251 - 300: 5 units CBG 301 - 350: 7 units CBG 351 - 400: 9 units What changed:   how much to take  how to take this  additional instructions   insulin glargine 100 UNIT/ML injection Commonly known as: LANTUS Inject 0.22 mLs (22 Units total) into the skin daily. Start taking on: September 23, 2020 What changed: how much to take   metFORMIN 500 MG tablet Commonly known as: Glucophage Take 1 tablet (500 mg total) by mouth 2 (two) times daily with a meal.   methylphenidate 5 MG tablet Commonly known as: RITALIN Take 5 mg  by mouth 2 (two) times daily.   metoprolol tartrate 50 MG tablet Commonly known as: LOPRESSOR Place 1 tablet (50 mg total) into feeding tube 2 (two) times daily.   Pen Needles 32G X 4 MM Misc Use as directed with insulin pen   rosuvastatin 20 MG tablet Commonly known as: CRESTOR Place 1 tablet (20 mg total) into feeding tube daily.   saccharomyces boulardii 250 MG capsule Commonly known as: FLORASTOR Take 1 capsule (250 mg total) by mouth 2 (two) times daily for 5 days.   senna 8.6 MG tablet Commonly known as: SENOKOT Take 2 tablets by mouth daily.       Time coordinating discharge: 35 minutes  The results of significant diagnostics from this hospitalization (including imaging, microbiology, ancillary and laboratory) are listed below for reference.    Procedures and Diagnostic Studies:   CT HEAD WO CONTRAST  Result Date: 09/19/2020 CLINICAL DATA:  Delirium, tachycardia, unresponsiveness. History of CVA and atrial fibrillation EXAM: CT HEAD WITHOUT CONTRAST TECHNIQUE: Contiguous axial images were obtained from the base of the skull through the vertex without intravenous contrast. COMPARISON:  08/01/2020 FINDINGS: Brain: Cerebral atrophy. Ventricular dilatation consistent with central atrophy. Low-attenuation changes in the deep white matter consistent with small vessel ischemia. Evolving infarct in the right frontal region consistent with anterior cerebral and middle cerebral artery distributions. Basal ganglia calcifications. No acute intracranial hemorrhage. No developing mass effect or midline shift. No abnormal extra-axial fluid collections. Gray-white matter junctions are distinct. Basal cisterns are not effaced. Vascular: Intracranial arterial vascular calcifications. Skull: Calvarium appears intact. Sinuses/Orbits: Retention cysts in the maxillary antra. Paranasal sinuses and mastoid air cells are otherwise clear. Other: None. IMPRESSION: 1. Evolving infarct in the right frontal  region consistent with anterior cerebral and middle cerebral artery distributions as seen on prior study. 2. No acute intracranial hemorrhage or mass effect. 3. Chronic atrophy and small vessel ischemia. Electronically Signed   By: Lucienne Capers M.D.   On: 09/19/2020 00:23   DG Chest Port 1 View  Result Date: 09/18/2020 CLINICAL DATA:  Sepsis EXAM: PORTABLE CHEST 1 VIEW COMPARISON:  08/16/2020 FINDINGS: Single frontal view of the chest demonstrates stable enlargement of the cardiac silhouette. Marked calcification of the mitral annulus. No airspace disease, effusion, or pneumothorax. IMPRESSION: 1. Stable enlarged cardiac silhouette.  No acute process. Electronically Signed   By: Randa Ngo M.D.   On: 09/18/2020 19:02     Labs:   Basic Metabolic Panel: Recent Labs  Lab 09/18/20 2005 09/18/20 2005 09/19/20 1601 09/19/20 0858 09/20/20 0218 09/20/20 0218 09/21/20 0500 09/22/20 0338  NA 139  --  138  --  133*  --  132* 134*  K 5.3*   < > 4.3   < > 3.9   < > 4.1 4.1  CL 106  --  104  --  101  --  101 104  CO2 25  --  22  --  23  --  24 23  GLUCOSE 212*  --  179*  --  301*  --  212* 193*  BUN 36*  --  32*  --  34*  --  24* 21  CREATININE 1.06*  --  0.91  --  0.80  --  0.65 0.77  CALCIUM 7.9*  --  8.3*  --  8.0*  --  7.9* 8.2*  MG  --   --   --   --  1.7  --  1.7  --   PHOS  --   --   --   --  2.8  --  2.6  --    < > = values in this interval not displayed.   GFR Estimated Creatinine Clearance: 50.3 mL/min (by C-G formula based on SCr of 0.77 mg/dL). Liver Function Tests: Recent Labs  Lab 09/18/20 2005 09/19/20 0858  AST 54* 33  ALT 40 44  ALKPHOS 54 64  BILITOT 0.8 0.8  PROT 5.5* 6.1*  ALBUMIN 1.8* 2.0*   No results for input(s): LIPASE, AMYLASE in the last 168 hours. No results for input(s): AMMONIA in the last 168 hours. Coagulation profile Recent Labs  Lab 09/18/20 2005  INR 1.4*    CBC: Recent Labs  Lab 09/18/20 1845 09/19/20 0858 09/20/20 0218  09/21/20 0500 09/22/20 0338  WBC 12.8* 10.9* 8.3 8.7 7.2  NEUTROABS 9.8* 7.5 5.9 6.4 4.7  HGB 10.4* 8.8* 8.6* 8.4* 9.1*  HCT 36.5 30.3* 29.5* 28.6* 31.0*  MCV 83.0 81.5 80.2 80.6 80.7  PLT 189 161 131* 127* 137*   Cardiac Enzymes: No results for input(s): CKTOTAL, CKMB, CKMBINDEX, TROPONINI in the last 168 hours. BNP: Invalid input(s): POCBNP CBG: Recent Labs  Lab 09/21/20 1209 09/21/20 1605 09/22/20 0042 09/22/20 0537 09/22/20 0844  GLUCAP 210* 220* 211* 191* 222*   D-Dimer No results for input(s): DDIMER in the last 72 hours. Hgb A1c No results for input(s): HGBA1C in the last 72 hours. Lipid Profile No results for input(s): CHOL, HDL, LDLCALC, TRIG, CHOLHDL, LDLDIRECT in the last 72 hours. Thyroid function studies No results for input(s): TSH, T4TOTAL, T3FREE, THYROIDAB in the last 72 hours.  Invalid input(s): FREET3 Anemia work up No results for input(s): VITAMINB12, FOLATE, FERRITIN, TIBC, IRON, RETICCTPCT in the last 72 hours. Microbiology Recent Results (from the past 240 hour(s))  Blood Culture (routine x 2)     Status: None (Preliminary result)   Collection Time: 09/18/20  6:47 PM   Specimen: BLOOD  Result Value Ref Range Status   Specimen Description BLOOD LEFT ANTECUBITAL  Final   Special Requests  Final    BOTTLES DRAWN AEROBIC AND ANAEROBIC Blood Culture results may not be optimal due to an inadequate volume of blood received in culture bottles   Culture   Final    NO GROWTH 4 DAYS Performed at [facility], [place] [address], [place], [place] [address]    Report Status PENDING  Incomplete  Blood Culture (routine x 2)     Status: None (Preliminary result)   Collection Time: 09/18/20  6:47 PM   Specimen: BLOOD RIGHT WRIST  Result Value Ref Range Status   Specimen Description BLOOD RIGHT WRIST  Final   Special Requests   Final    BOTTLES DRAWN AEROBIC AND ANAEROBIC Blood Culture results may not be optimal due to an inadequate volume of blood  received in culture bottles   Culture   Final    NO GROWTH 4 DAYS Performed at [facility], [place] [address], [place], [place] [address]    Report Status PENDING  Incomplete  Urine culture     Status: Abnormal   Collection Time: 09/18/20  6:50 PM   Specimen: In/Out Cath Urine  Result Value Ref Range Status   Specimen Description IN/OUT CATH URINE  Final   Special Requests   Final    NONE Performed at [facility], [place] [address], [place],  [address]    Culture >=100,000 COLONIES/mL YEAST (A)  Final   Report Status 09/20/2020 FINAL  Final  Respiratory Panel by RT PCR (Flu A&B, Covid) - Nasopharyngeal Swab     Status: None   Collection Time: 09/18/20  6:56 PM   Specimen: Nasopharyngeal Swab  Result Value Ref Range Status   SARS Coronavirus 2 by RT PCR NEGATIVE NEGATIVE Final    Comment: (NOTE) SARS-CoV-2 target nucleic acids are NOT DETECTED.  The SARS-CoV-2 RNA is generally detectable in upper respiratoy specimens during the acute phase of infection. The lowest concentration of SARS-CoV-2 viral copies this assay can detect is 131 copies/mL. A negative result does not preclude SARS-Cov-2 infection and should not be used as the sole basis for treatment or other patient management decisions. A negative result may occur with  improper specimen collection/handling, submission of specimen other than nasopharyngeal swab, presence of viral mutation(s) within the areas targeted by this assay, and inadequate number of viral copies (<131 copies/mL). A negative result must be combined with clinical observations, patient history, and epidemiological information. The expected result is Negative.  Fact Sheet for Patients:  PinkCheek.be  Fact Sheet for Healthcare Providers:  GravelBags.it  This test is no t yet approved or cleared by the [place] FDA and  has been authorized for detection and/or diagnosis of  SARS-CoV-2 by FDA under an Emergency Use Authorization (EUA). This EUA will remain  in effect (meaning this test can be used) for the duration of the COVID-19 declaration under Section 564(b)(1) of the Act, 21 U.S.C. section 360bbb-3(b)(1), unless the authorization is terminated or revoked sooner.     Influenza A by PCR NEGATIVE NEGATIVE Final   Influenza B by PCR NEGATIVE NEGATIVE Final    Comment: (NOTE) The Xpert Xpress SARS-CoV-2/FLU/RSV assay is intended as an aid in  the diagnosis of influenza from Nasopharyngeal swab specimens and  should not be used as a sole basis for treatment. Nasal washings and  aspirates are unacceptable for Xpert Xpress SARS-CoV-2/FLU/RSV  testing.  Fact Sheet for Patients: PinkCheek.be  Fact Sheet for Healthcare Providers: GravelBags.it  This test is not yet approved  or cleared by the [place] and  has been authorized for detection and/or diagnosis of SARS-CoV-2 by  FDA under an Emergency Use Authorization (EUA). This EUA will remain  in effect (meaning this test can be used) for the duration of the  Covid-19 declaration under Section 564(b)(1) of the Act, 21  U.S.C. section 360bbb-3(b)(1), unless the authorization is  terminated or revoked. Performed at [facility], [place] [address] [place], [place] [address]      Signed: Terrilee Croak  [facility] 09/22/2020, 10:23 AM

## 2020-09-22 NOTE — NC FL2 (Signed)
[place] MEDICAID FL2 LEVEL OF CARE SCREENING TOOL     IDENTIFICATION  Patient Name: Samantha Paul Birthdate: 07-15-1946 Sex: female Admission Date (Current Location): 09/18/2020  [facility] and [place] Number:  Producer, television/film/video and Address:  [facility], [address], [place], [place] [address]      Provider Number: [id number]  Attending Physician Name and Address:  Lorin Glass, MD  Relative Name and Phone Number:  Caro Laroche [phone]    Current Level of Care: Hospital Recommended Level of Care: Skilled Nursing Facility Prior Approval Number:    Date Approved/Denied:   PASRR Number: [id number]  Discharge Plan: SNF    Current Diagnoses: Patient Active Problem List   Diagnosis Date Noted  . Dysphagia due to recent cerebrovascular accident   . Acute pyelonephritis 08/17/2020  . C. difficile colitis 08/16/2020  . Palliative care by specialist   . DNR (do not resuscitate) discussion   . Acute respiratory failure with hypoxia (HCC)   . Stroke (HCC) 07/26/2020  . Stroke due to embolism (HCC) 07/26/2020  . Middle cerebral artery embolism, right 07/26/2020  . Sepsis (HCC) 06/29/2020  . DKA (diabetic ketoacidoses) (HCC) 06/29/2020  . ARF (acute renal failure) (HCC) 06/29/2020  . Hemiparesis affecting right side as late effect of stroke (HCC)   . Acute blood loss anemia   . Left pontine stroke (HCC) 07/13/2018  . Essential hypertension   . Diabetes mellitus type 2 in nonobese (HCC)   . CVA (cerebral vascular accident) (HCC) 07/10/2018  . Hypertensive urgency 07/10/2018  . Hyperglycemia 07/10/2018  . Weakness generalized 07/10/2018  . Hyperlipidemia     Orientation RESPIRATION BLADDER Height & Weight     Self  Normal Incontinent Weight: 120 lb 13 oz (54.8 kg) Height:  5\' 2"  (157.5 cm)  BEHAVIORAL SYMPTOMS/MOOD NEUROLOGICAL BOWEL NUTRITION STATUS      Incontinent Diet (see discharge summary)  AMBULATORY STATUS COMMUNICATION OF  NEEDS Skin   Total Care Does not communicate Normal                       Personal Care Assistance Level of Assistance  Bathing, Feeding, Dressing Bathing Assistance: Maximum assistance Feeding assistance: Maximum assistance Dressing Assistance: Maximum assistance     Functional Limitations Info  Sight, Hearing, Speech Sight Info: Adequate Hearing Info: Adequate Speech Info: Adequate    SPECIAL CARE FACTORS FREQUENCY  PT (By licensed PT)     PT Frequency: 5x week              Contractures Contractures Info: Not present    Additional Factors Info  Code Status, Allergies Code Status Info: full Allergies Info: metformin and related           Current Medications (09/22/2020):  This is the current hospital active medication list Current Facility-Administered Medications  Medication Dose Route Frequency Provider Last Rate Last Admin  . acetaminophen (TYLENOL) 160 MG/5ML solution 650 mg  650 mg Per Tube Q4H PRN 09/24/2020 N, DO   650 mg at 09/22/20 0816  . apixaban (ELIQUIS) tablet 5 mg  5 mg Per Tube BID 09/24/20 N, DO   5 mg at 09/22/20 0815  . bethanechol (URECHOLINE) tablet 10 mg  10 mg Per Tube TID 09/24/20 N, DO   10 mg at 09/22/20 0816  . cefdinir (OMNICEF) capsule 300 mg  300 mg Oral Q12H Dahal, 09/24/20, MD   300 mg at 09/22/20 0816  . Chlorhexidine  Gluconate Cloth 2 % PADS 6 each  6 each Topical Daily Dahal, Melina Schools, MD   6 each at 09/22/20 0817  . diltiazem (CARDIZEM) tablet 60 mg  60 mg Per Tube QID Lorin Glass, MD   60 mg at 09/22/20 0700  . feeding supplement (JEVITY 1.2 CAL) liquid 1,000 mL  1,000 mL Per Tube Continuous Lorin Glass, MD 60 mL/hr at 09/19/20 1355 New Bag at 09/19/20 1355  . feeding supplement (PROSource TF) liquid 45 mL  45 mL Per Tube Daily Dahal, Melina Schools, MD   45 mL at 09/22/20 0816  . ferrous sulfate 300 (60 Fe) MG/5ML syrup 300 mg  300 mg Per Tube TID Dow Adolph N, DO   300 mg at 09/22/20 0816  . free water 100 mL  100 mL  Per Tube Q8H Hall, Carole N, DO   100 mL at 09/22/20 0538  . insulin aspart (novoLOG) injection 0-9 Units  0-9 Units Subcutaneous Q4H Darlin Drop, DO   2 Units at 09/22/20 0541  . insulin glargine (LANTUS) injection 22 Units  22 Units Subcutaneous Daily Lorin Glass, MD   22 Units at 09/22/20 0816  . metFORMIN (GLUCOPHAGE) tablet 500 mg  500 mg Oral BID WC Dahal, Melina Schools, MD   500 mg at 09/22/20 0815  . methylphenidate (RITALIN) tablet 5 mg  5 mg Per Tube BID Dow Adolph N, DO   5 mg at 09/22/20 0815  . metoprolol tartrate (LOPRESSOR) tablet 50 mg  50 mg Per Tube BID Dow Adolph N, DO   50 mg at 09/22/20 0815  . rosuvastatin (CRESTOR) tablet 20 mg  20 mg Per Tube Daily Dow Adolph N, DO   20 mg at 09/22/20 0818  . senna (SENOKOT) tablet 17.2 mg  2 tablet Oral Daily Dahal, Binaya, MD   17.2 mg at 09/22/20 0815  . sodium chloride flush (NS) 0.9 % injection 10-40 mL  10-40 mL Intracatheter Q12H Alvira Monday, MD   10 mL at 09/22/20 0818  . sodium chloride flush (NS) 0.9 % injection 10-40 mL  10-40 mL Intracatheter PRN Alvira Monday, MD         Discharge Medications: Please see discharge summary for a list of discharge medications.  Relevant Imaging Results:  Relevant Lab Results:   Additional Information SS#: [id number]  Lorri Frederick, LCSW

## 2020-09-22 NOTE — Plan of Care (Signed)
  Problem: Clinical Measurements: Goal: Cardiovascular complication will be avoided Outcome: Progressing   Problem: Nutrition: Goal: Adequate nutrition will be maintained Outcome: Progressing   

## 2020-09-22 NOTE — TOC Transition Note (Signed)
Transition of Care [facility]) - CM/SW Discharge Note   Patient Details  Name: Samantha Paul MRN: [id number] Date of Birth: September 20, 1946  Transition of Care [facility]) CM/SW Contact:  Lorri Frederick, LCSW Phone Number: 09/22/2020, 1:44 PM   Clinical Narrative:   Pt going to [facility], room 203A.  RN call report to [phone].  PTAR called [phone].     Final next level of care: Skilled Nursing Facility Barriers to Discharge: Barriers Resolved   Patient Goals and CMS Choice        Discharge Placement              Patient chooses bed at: [facility] Patient to be transferred to facility by: PTAR Name of family member notified: Ansel Bong, husband Patient and family notified of of transfer: 09/22/20  Discharge Plan and Services     Post Acute Care Choice: Skilled Nursing Facility                               Social Determinants of Health (SDOH) Interventions     Readmission Risk Interventions Readmission Risk Prevention Plan 07/03/2020 07/03/2020  Post Dischage Appt Complete -  Medication Screening Complete Complete  Transportation Screening Complete Complete

## 2020-09-22 NOTE — TOC Progression Note (Signed)
Transition of Care [facility]) - Progression Note    Patient Details  Name: Erynn Vaca MRN: [id number] Date of Birth: 05/26/1946  Transition of Care [facility]) CM/SW Contact  Lorri Frederick, LCSW Phone Number: 09/22/2020, 10:50 AM  Clinical Narrative:   Interpretor Vung Ksor provided services to answer pt husband questions.  Husband had questions about charges at [facility] from [facility] called and was able to explain charges moving forward: they have applied for medicaid, which will cover any copays and out of pocket costs--pt has been in long term care prior to her return to hospital.     Expected Discharge Plan: Skilled Nursing Facility Barriers to Discharge: Continued Medical Work up  Expected Discharge Plan and Services Expected Discharge Plan: Skilled Nursing Facility     Post Acute Care Choice: Skilled Nursing Facility Living arrangements for the past 2 months: Skilled Nursing Facility Expected Discharge Date: 09/22/20                                     Social Determinants of Health (SDOH) Interventions    Readmission Risk Interventions Readmission Risk Prevention Plan 07/03/2020 07/03/2020  Post Dischage Appt Complete -  Medication Screening Complete Complete  Transportation Screening Complete Complete

## 2020-09-23 LAB — CULTURE, BLOOD (ROUTINE X 2)
Culture: NO GROWTH
Culture: NO GROWTH

## 2020-09-24 ENCOUNTER — Inpatient Hospital Stay: Payer: Medicare Other | Admitting: Adult Health

## 2020-09-24 ENCOUNTER — Encounter: Payer: Self-pay | Admitting: Adult Health

## 2020-09-24 NOTE — Progress Notes (Deleted)
[facility] [address] [place]. [place] [address] [phone]       HOSPITAL FOLLOW UP NOTE  [name] Date of Birth:  1946/04/23 Medical Record Number:  [id number]   Reason for Referral:  hospital stroke follow up    SUBJECTIVE:   CHIEF COMPLAINT:  No chief complaint on file.   HPI:   [name] is a 74 y.o. female with history of atrial fibrillation, HTN, DM andstroke who presented on 07/26/2020 with right gaze deviation, left-sidedhemiplegiaand dense receptive and expressive aphasia.  Personally reviewed recent hospitalization pertinent progress notes, lab work and imaging with summary provided below.  Stroke work-up revealed R MCA and ACA infarct due to R ICA occlusion s/p tPA and IR with TICI 2C-3, embolic secondary to known AF not on AC over the past week.  Restart Eliquis for secondary stroke prevention and history of atrial fibrillation.  Continue Crestor for HLD with LDL 90.  Uncontrolled DM with A1c 12.7 with hypoglycemic episodes during admission.  Prior history of L pontine stroke 06/2018 secondary to small vessel disease.  Hospital course complicated by sepsis likely secondary to C. Difficile, microcytic anemia, A. fib with RVR, AKI and hyponatremia.  Residual stroke deficits include dysphagia s/p PEG placement, nonverbal, right gaze preference with peripheral vision deficit, left, left lower facial weakness, and left hemiplegia.  Evaluated by therapy and discharged to SNF for ongoing therapy needs.  Stroke:  R MCA and ACA infarct due to R tICA occlusion s/p tPA and IR w/ [id number], embolic secondary to known AF but not on AC for at least a week  Code Stroke CT head No acute abnormality.     CTA head & neck R tICA occlusion w/ min MCA flow. Some narrowing B VA and BA.   CT perfusion R frontal core 16 cc with [id number] R MCA territory  Cerebral angio RT ICA T occlusion ->TICI 2C revascularization of RT MCA and a TICI 3 revascularization of RT  ACA .  CT head post IR - new R frontal lobe hyper and hypointensity (infarct, edema, petechial hemorrhage)  MRI right MCA and ACA multifocal large infarcts with petechial hemo  CT repeat stable right MCA multifocal infarcts, minimal MLS.  2D Echo 07/03/2020 EF 65-70%. No source of embolus   LDL 90  HgbA1c 12.7  VTE prophylaxis - SCDs   Eliquis (apixaban) daily prior to admission, switch ASA to eliquis today.   Therapy recommendations: SNF  Disposition:  SNF     ROS:   14 system review of systems performed and negative with exception of ***  PMH:  Past Medical History:  Diagnosis Date  . Afib ([place]) 06/2020   Diagnosed 06/2020, on cardizem, metoprolol, eliquis  . Back pain   . CVA (cerebral vascular accident) ([place]) 07/26/2020   R MCA/ACA CVA  . DM II (diabetes mellitus, type II), controlled ([place])   . HTN (hypertension)   . Knee pain   . Stroke [facility]) ?   residual mild right sided weakness     PSH:  Past Surgical History:  Procedure Laterality Date  . CHOLECYSTECTOMY N/A 06/30/2020   Procedure: LAPAROSCOPIC CHOLECYSTECTOMY;  Surgeon: Georganna Skeans, MD;  Location: [place];  Service: General;  Laterality: N/A;  . ESOPHAGOGASTRODUODENOSCOPY N/A 08/18/2020   Procedure: ESOPHAGOGASTRODUODENOSCOPY (EGD);  Surgeon: Georganna Skeans, MD;  Location: [place];  Service: Endoscopy;  Laterality: N/A;  . IR ANGIO VERTEBRAL SEL SUBCLAVIAN INNOMINATE UNI R MOD SED  07/29/2020  . IR PERCUTANEOUS ART  THROMBECTOMY/INFUSION INTRACRANIAL INC DIAG ANGIO  07/26/2020  . PEG PLACEMENT N/A 08/18/2020   Procedure: PERCUTANEOUS ENDOSCOPIC GASTROSTOMY (PEG) PLACEMENT;  Surgeon: Georganna Skeans, MD;  Location: [place];  Service: Endoscopy;  Laterality: N/A;  . RADIOLOGY WITH ANESTHESIA N/A 07/26/2020   Procedure: IR WITH ANESTHESIA;  Surgeon: Radiologist, Medication, MD;  Location: [place];  Service: Radiology;  Laterality: N/A;    Social History:  Social History   Socioeconomic History  .  Marital status: Married    Spouse name: Not on file  . Number of children: Not on file  . Years of education: Not on file  . Highest education level: Not on file  Occupational History  . Not on file  Tobacco Use  . Smoking status: Never Smoker  . Smokeless tobacco: Never Used  Substance and Sexual Activity  . Alcohol use: Not Currently  . Drug use: Not Currently  . Sexual activity: Not on file  Other Topics Concern  . Not on file  Social History Narrative  . Not on file   Social Determinants of Health   Financial Resource Strain:   . Difficulty of Paying Living Expenses: Not on file  Food Insecurity:   . Worried About Charity fundraiser in the Last Year: Not on file  . Ran Out of Food in the Last Year: Not on file  Transportation Needs:   . Lack of Transportation (Medical): Not on file  . Lack of Transportation (Non-Medical): Not on file  Physical Activity:   . Days of Exercise per Week: Not on file  . Minutes of Exercise per Session: Not on file  Stress:   . Feeling of Stress : Not on file  Social Connections:   . Frequency of Communication with Friends and Family: Not on file  . Frequency of Social Gatherings with Friends and Family: Not on file  . Attends Religious Services: Not on file  . Active Member of Clubs or Organizations: Not on file  . Attends Archivist Meetings: Not on file  . Marital Status: Not on file  Intimate Partner Violence:   . Fear of Current or Ex-Partner: Not on file  . Emotionally Abused: Not on file  . Physically Abused: Not on file  . Sexually Abused: Not on file    Family History:  Family History  Problem Relation Age of Onset  . Arthritis Brother     Medications:   Current Outpatient Medications on File Prior to Visit  Medication Sig Dispense Refill  . acetaminophen (TYLENOL) 160 MG/5ML solution Place 20.3 mLs (650 mg total) into feeding tube every 4 (four) hours as needed for mild pain (or temp > 37.5 C (99.5 F)). 120  mL 0  . acetaminophen (TYLENOL) 325 MG tablet Place 650 mg into feeding tube every 8 (eight) hours as needed for moderate pain.    Marland Kitchen apixaban (ELIQUIS) 5 MG TABS tablet Take 1 tablet (5 mg total) by mouth 2 (two) times daily. 60 tablet 3  . bethanechol (URECHOLINE) 10 MG tablet Place 1 tablet (10 mg total) into feeding tube 3 (three) times daily.    . blood glucose meter kit and supplies KIT Dispense based on patient and insurance preference. Use up to four times daily as directed. (FOR ICD-10--E11.9). 1 each 0  . cefdinir (OMNICEF) 300 MG capsule Take 1 capsule (300 mg total) by mouth every 12 (twelve) hours for 3 days. 6 capsule 0  . diltiazem (CARDIZEM) 60 MG tablet Place 60 mg into  feeding tube 4 (four) times daily.    . ferrous sulfate 300 (60 Fe) MG/5ML syrup Place 5 mLs (300 mg total) into feeding tube 3 (three) times daily. 150 mL 3  . insulin aspart (NOVOLOG) 100 UNIT/ML injection CBG 70 - 120: 0 units CBG 121 - 150: 1 unit CBG 151 - 200: 2 units CBG 201 - 250: 3 units CBG 251 - 300: 5 units CBG 301 - 350: 7 units CBG 351 - 400: 9 units (Patient taking differently: Inject 0-9 Units into the skin. CBG 0 - 59 : Notify MD CBG 60 - 150: 0 units CBG 151 - 199: 2 units CBG 200 - 249: 4 units CBG 250 - 299: 6 units CBG 300 - 349: 8 units CBG 350 - 399: 10 units CBG [phone] : Notify MD) 10 mL 11  . insulin glargine (LANTUS) 100 UNIT/ML injection Inject 0.22 mLs (22 Units total) into the skin daily. 10 mL 11  . Insulin Pen Needle (PEN NEEDLES) 32G X 4 MM MISC Use as directed with insulin pen 100 each 11  . metFORMIN (GLUCOPHAGE) 500 MG tablet Take 1 tablet (500 mg total) by mouth 2 (two) times daily with a meal. 60 tablet 11  . methylphenidate (RITALIN) 5 MG tablet Take 5 mg by mouth 2 (two) times daily.    . metoprolol tartrate (LOPRESSOR) 50 MG tablet Place 1 tablet (50 mg total) into feeding tube 2 (two) times daily.    . Nutritional Supplements (FEEDING SUPPLEMENT, JEVITY 1.2 CAL,)  LIQD Place 1,000 mLs into feeding tube continuous.  0  . rosuvastatin (CRESTOR) 20 MG tablet Place 1 tablet (20 mg total) into feeding tube daily.    Marland Kitchen saccharomyces boulardii (FLORASTOR) 250 MG capsule Take 1 capsule (250 mg total) by mouth 2 (two) times daily for 5 days.    Marland Kitchen senna (SENOKOT) 8.6 MG tablet Take 2 tablets by mouth daily.    . Water For Irrigation, Sterile (FREE WATER) SOLN Place 100 mLs into feeding tube every 8 (eight) hours.     No current facility-administered medications on file prior to visit.    Allergies:   Allergies  Allergen Reactions  . Metformin And Related Diarrhea    Abdominal pain and diarrhea      OBJECTIVE:  Physical Exam  There were no vitals filed for this visit. There is no height or weight on file to calculate BMI. No exam data present  Depression screen [facility] 2/9 08/01/2018  Decreased Interest 0  Down, Depressed, Hopeless 0  PHQ - 2 Score 0  Altered sleeping 0  Tired, decreased energy 1  Change in appetite 0  Feeling bad or failure about yourself  0  Trouble concentrating 0  Moving slowly or fidgety/restless 0  Suicidal thoughts 0  PHQ-9 Score 1  Difficult doing work/chores Somewhat difficult     General: well developed, well nourished, seated, in no evident distress Head: head normocephalic and atraumatic.   Neck: supple with no carotid or supraclavicular bruits Cardiovascular: regular rate and rhythm, no murmurs Musculoskeletal: no deformity Skin:  no rash/petichiae Vascular:  Normal pulses all extremities   Neurologic Exam Mental Status: Awake and fully alert. Oriented to place and time. Recent and remote memory intact. Attention span, concentration and fund of knowledge appropriate. Mood and affect appropriate.  Cranial Nerves: Fundoscopic exam reveals sharp disc margins. Pupils equal, briskly reactive to light. Extraocular movements full without nystagmus. Visual fields full to confrontation. Hearing intact. Facial sensation  intact. Face, tongue,  palate moves normally and symmetrically.  Motor: Normal bulk and tone. Normal strength in all tested extremity muscles. Sensory.: intact to touch , pinprick , position and vibratory sensation.  Coordination: Rapid alternating movements normal in all extremities. Finger-to-nose and heel-to-shin performed accurately bilaterally. Gait and Station: Arises from chair without difficulty. Stance is normal. Gait demonstrates normal stride length and balance Reflexes: 1+ and symmetric. Toes downgoing.     NIHSS  *** Modified Rankin  *** CHA2DS2-VASc *** HAS-BLED ***     ASSESSMENT: Renalda Bence is a 74 y.o. year old female presented with *** on *** secondary to ***. Vascular risk factors include ***.      PLAN:  1. *** : Residual deficit: ***. Continue {anticoagulants:31417}  and ***  for secondary stroke prevention. Close PCP follow up for aggressive stroke risk factor management  2. HTN: BP goal <130/90. Continue f/u with PCP 3. HLD: LDL goal <70. Recent LDL ***. F/u with PCP for management as well as prescribing of statin 4. DMII: A1c goal<7.0. Recent A1c ***. F/u with PCP    Follow up in *** or call earlier if needed   I spent *** minutes of face-to-face and non-face-to-face time with patient.  This included previsit chart review, lab review, study review, order entry, electronic health record documentation, patient education regarding recent stroke, residual deficits, importance of managing stroke risk factors and answered all questions to patient satisfaction     Frann Rider, [facility]  [facility] [address] [place], [place] [address]  Phone [phone] Fax [phone] Note: This document was prepared with digital dictation and possible smart phrase technology. Any transcriptional errors that result from this process are unintentional.

## 2020-10-22 ENCOUNTER — Encounter: Payer: Self-pay | Admitting: Adult Health

## 2020-10-22 ENCOUNTER — Inpatient Hospital Stay: Payer: Medicare Other | Admitting: Adult Health

## 2020-10-22 NOTE — Progress Notes (Deleted)
[facility] [address] [place]. [place] [address] [phone]       HOSPITAL FOLLOW UP NOTE  [name] Date of Birth:  01/28/1946 Medical Record Number:  [id number]   Reason for Referral:  hospital stroke follow up    SUBJECTIVE:   CHIEF COMPLAINT:  No chief complaint on file.   HPI:   [name] is a 74 y.o. female with history of atrial fibrillation, HTN, DM andstroke who presented on 07/26/2020 with right gaze deviation, left-sidedhemiplegiaand dense receptive and expressive aphasia.  Personally reviewed recent hospitalization pertinent progress notes, lab work and imaging with summary provided below.  Stroke work-up revealed R MCA and ACA infarct due to R ICA occlusion s/p tPA and IR with TICI 2C-3, embolic secondary to known AF not on AC over the past week.  Restart Eliquis for secondary stroke prevention and history of atrial fibrillation.  Continue Crestor for HLD with LDL 90.  Uncontrolled DM with A1c 12.7 with hypoglycemic episodes during admission.  Prior history of L pontine stroke 06/2018 secondary to small vessel disease.  Hospital course complicated by sepsis likely secondary to C. Difficile, microcytic anemia, A. fib with RVR, AKI and hyponatremia.  Residual stroke deficits include dysphagia s/p PEG placement, nonverbal, right gaze preference with peripheral vision deficit, left, left lower facial weakness, and left hemiplegia.  Evaluated by therapy and discharged to SNF for ongoing therapy needs.  Stroke:  R MCA and ACA infarct due to R tICA occlusion s/p tPA and IR w/ [id number], embolic secondary to known AF but not on AC for at least a week  Code Stroke CT head No acute abnormality.     CTA head & neck R tICA occlusion w/ min MCA flow. Some narrowing B VA and BA.   CT perfusion R frontal core 16 cc with [id number] R MCA territory  Cerebral angio RT ICA T occlusion ->TICI 2C revascularization of RT MCA and a TICI 3 revascularization of RT  ACA .  CT head post IR - new R frontal lobe hyper and hypointensity (infarct, edema, petechial hemorrhage)  MRI right MCA and ACA multifocal large infarcts with petechial hemo  CT repeat stable right MCA multifocal infarcts, minimal MLS.  2D Echo 07/03/2020 EF 65-70%. No source of embolus   LDL 90  HgbA1c 12.7  VTE prophylaxis - SCDs   Eliquis (apixaban) daily prior to admission, switch ASA to eliquis today.   Therapy recommendations: SNF  Disposition:  SNF  Today, 10/22/2020, [name] is being seen for hospital follow-up from admission on 07/26/2020-08/23/2020.       ROS:   14 system review of systems performed and negative with exception of ***  PMH:  Past Medical History:  Diagnosis Date  . Afib ([place]) 06/2020   Diagnosed 06/2020, on cardizem, metoprolol, eliquis  . Back pain   . CVA (cerebral vascular accident) ([place]) 07/26/2020   R MCA/ACA CVA  . DM II (diabetes mellitus, type II), controlled ([place])   . HTN (hypertension)   . Knee pain   . Stroke [facility]) ?   residual mild right sided weakness     PSH:  Past Surgical History:  Procedure Laterality Date  . CHOLECYSTECTOMY N/A 06/30/2020   Procedure: LAPAROSCOPIC CHOLECYSTECTOMY;  Surgeon: Georganna Skeans, MD;  Location: [place];  Service: General;  Laterality: N/A;  . ESOPHAGOGASTRODUODENOSCOPY N/A 08/18/2020   Procedure: ESOPHAGOGASTRODUODENOSCOPY (EGD);  Surgeon: Georganna Skeans, MD;  Location: [place];  Service: Endoscopy;  Laterality: N/A;  .  IR ANGIO VERTEBRAL SEL SUBCLAVIAN INNOMINATE UNI R MOD SED  07/29/2020  . IR PERCUTANEOUS ART THROMBECTOMY/INFUSION INTRACRANIAL INC DIAG ANGIO  07/26/2020  . PEG PLACEMENT N/A 08/18/2020   Procedure: PERCUTANEOUS ENDOSCOPIC GASTROSTOMY (PEG) PLACEMENT;  Surgeon: Georganna Skeans, MD;  Location: [address];  Service: Endoscopy;  Laterality: N/A;  . RADIOLOGY WITH ANESTHESIA N/A 07/26/2020   Procedure: IR WITH ANESTHESIA;  Surgeon: Radiologist, Medication, MD;  Location: [place];   Service: Radiology;  Laterality: N/A;    Social History:  Social History   Socioeconomic History  . Marital status: Married    Spouse name: Not on file  . Number of children: Not on file  . Years of education: Not on file  . Highest education level: Not on file  Occupational History  . Not on file  Tobacco Use  . Smoking status: Never Smoker  . Smokeless tobacco: Never Used  Substance and Sexual Activity  . Alcohol use: Not Currently  . Drug use: Not Currently  . Sexual activity: Not on file  Other Topics Concern  . Not on file  Social History Narrative  . Not on file   Social Determinants of Health   Financial Resource Strain:   . Difficulty of Paying Living Expenses: Not on file  Food Insecurity:   . Worried About Charity fundraiser in the Last Year: Not on file  . Ran Out of Food in the Last Year: Not on file  Transportation Needs:   . Lack of Transportation (Medical): Not on file  . Lack of Transportation (Non-Medical): Not on file  Physical Activity:   . Days of Exercise per Week: Not on file  . Minutes of Exercise per Session: Not on file  Stress:   . Feeling of Stress : Not on file  Social Connections:   . Frequency of Communication with Friends and Family: Not on file  . Frequency of Social Gatherings with Friends and Family: Not on file  . Attends Religious Services: Not on file  . Active Member of Clubs or Organizations: Not on file  . Attends Archivist Meetings: Not on file  . Marital Status: Not on file  Intimate Partner Violence:   . Fear of Current or Ex-Partner: Not on file  . Emotionally Abused: Not on file  . Physically Abused: Not on file  . Sexually Abused: Not on file    Family History:  Family History  Problem Relation Age of Onset  . Arthritis Brother     Medications:   Current Outpatient Medications on File Prior to Visit  Medication Sig Dispense Refill  . acetaminophen (TYLENOL) 160 MG/5ML solution Place 20.3 mLs (650  mg total) into feeding tube every 4 (four) hours as needed for mild pain (or temp > 37.5 C (99.5 F)). 120 mL 0  . acetaminophen (TYLENOL) 325 MG tablet Place 650 mg into feeding tube every 8 (eight) hours as needed for moderate pain.    Marland Kitchen apixaban (ELIQUIS) 5 MG TABS tablet Take 1 tablet (5 mg total) by mouth 2 (two) times daily. 60 tablet 3  . bethanechol (URECHOLINE) 10 MG tablet Place 1 tablet (10 mg total) into feeding tube 3 (three) times daily.    . blood glucose meter kit and supplies KIT Dispense based on patient and insurance preference. Use up to four times daily as directed. (FOR ICD-10--E11.9). 1 each 0  . diltiazem (CARDIZEM) 60 MG tablet Place 60 mg into feeding tube 4 (four) times daily.    Marland Kitchen  ferrous sulfate 300 (60 Fe) MG/5ML syrup Place 5 mLs (300 mg total) into feeding tube 3 (three) times daily. 150 mL 3  . insulin aspart (NOVOLOG) 100 UNIT/ML injection CBG 70 - 120: 0 units CBG 121 - 150: 1 unit CBG 151 - 200: 2 units CBG 201 - 250: 3 units CBG 251 - 300: 5 units CBG 301 - 350: 7 units CBG 351 - 400: 9 units (Patient taking differently: Inject 0-9 Units into the skin. CBG 0 - 59 : Notify MD CBG 60 - 150: 0 units CBG 151 - 199: 2 units CBG 200 - 249: 4 units CBG 250 - 299: 6 units CBG 300 - 349: 8 units CBG 350 - 399: 10 units CBG [phone] : Notify MD) 10 mL 11  . insulin glargine (LANTUS) 100 UNIT/ML injection Inject 0.22 mLs (22 Units total) into the skin daily. 10 mL 11  . Insulin Pen Needle (PEN NEEDLES) 32G X 4 MM MISC Use as directed with insulin pen 100 each 11  . metFORMIN (GLUCOPHAGE) 500 MG tablet Take 1 tablet (500 mg total) by mouth 2 (two) times daily with a meal. 60 tablet 11  . methylphenidate (RITALIN) 5 MG tablet Take 5 mg by mouth 2 (two) times daily.    . metoprolol tartrate (LOPRESSOR) 50 MG tablet Place 1 tablet (50 mg total) into feeding tube 2 (two) times daily.    . Nutritional Supplements (FEEDING SUPPLEMENT, JEVITY 1.2 CAL,) LIQD Place 1,000 mLs  into feeding tube continuous.  0  . rosuvastatin (CRESTOR) 20 MG tablet Place 1 tablet (20 mg total) into feeding tube daily.    Marland Kitchen senna (SENOKOT) 8.6 MG tablet Take 2 tablets by mouth daily.    . Water For Irrigation, Sterile (FREE WATER) SOLN Place 100 mLs into feeding tube every 8 (eight) hours.     No current facility-administered medications on file prior to visit.    Allergies:   Allergies  Allergen Reactions  . Metformin And Related Diarrhea    Abdominal pain and diarrhea      OBJECTIVE:  Physical Exam  There were no vitals filed for this visit. There is no height or weight on file to calculate BMI. No exam data present  Depression screen [facility] 2/9 08/01/2018  Decreased Interest 0  Down, Depressed, Hopeless 0  PHQ - 2 Score 0  Altered sleeping 0  Tired, decreased energy 1  Change in appetite 0  Feeling bad or failure about yourself  0  Trouble concentrating 0  Moving slowly or fidgety/restless 0  Suicidal thoughts 0  PHQ-9 Score 1  Difficult doing work/chores Somewhat difficult     General: well developed, well nourished, seated, in no evident distress Head: head normocephalic and atraumatic.   Neck: supple with no carotid or supraclavicular bruits Cardiovascular: regular rate and rhythm, no murmurs Musculoskeletal: no deformity Skin:  no rash/petichiae Vascular:  Normal pulses all extremities   Neurologic Exam Mental Status: Awake and fully alert. Oriented to place and time. Recent and remote memory intact. Attention span, concentration and fund of knowledge appropriate. Mood and affect appropriate.  Cranial Nerves: Fundoscopic exam reveals sharp disc margins. Pupils equal, briskly reactive to light. Extraocular movements full without nystagmus. Visual fields full to confrontation. Hearing intact. Facial sensation intact. Face, tongue, palate moves normally and symmetrically.  Motor: Normal bulk and tone. Normal strength in all tested extremity  muscles. Sensory.: intact to touch , pinprick , position and vibratory sensation.  Coordination: Rapid alternating movements normal  in all extremities. Finger-to-nose and heel-to-shin performed accurately bilaterally. Gait and Station: Arises from chair without difficulty. Stance is normal. Gait demonstrates normal stride length and balance Reflexes: 1+ and symmetric. Toes downgoing.     NIHSS  *** Modified Rankin  *** CHA2DS2-VASc *** HAS-BLED ***     ASSESSMENT: Samantha Paul is a 74 y.o. year old female presented with right gaze deviation, left-sided hemiplegia and dense receptive and expressive aphasia on 07/26/2020 with stroke work-up revealing R MCA and ACA infarcts due to R ICA occlusion s/p tPA and IR with TICI 2C-3 reperfusion, embolic secondary to known AF but not on AC for the past week.Vascular risk factors include hx of L pontine stroke secondary to small vessel disease in 06/2018, HTN, DM and atrial fibrillation.      PLAN:  1. R MCA and ACA stroke :  a. Discharged to SNF due to residual deficits.  Current residual deficit: ***.  b. Continue Eliquis (apixaban) daily  and Crestor 20 mg daily for secondary stroke prevention.  c. Discussed secondary stroke prevention measures and importance of close PCP follow up for aggressive stroke risk factor management  2. Atrial fibrillation: On Eliquis, diltiazem and metoprolol per cardiology 3. HTN: BP goal <130/90. Continue f/u with PCP 4. HLD: LDL goal <70. Recent LDL 90. F/u with PCP for management as well as prescribing of statin 5. DMII: A1c goal<7.0. Recent A1c 12.7. F/u with PCP    Follow up in *** or call earlier if needed   I spent *** minutes of face-to-face and non-face-to-face time with patient.  This included previsit chart review, lab review, study review, order entry, electronic health record documentation, patient education regarding recent stroke, residual deficits, importance of managing stroke risk factors and  answered all questions to patient satisfaction     Frann Rider, [facility]  [facility] [address] [place], [place] [address]  Phone [phone] Fax [phone] Note: This document was prepared with digital dictation and possible smart phrase technology. Any transcriptional errors that result from this process are unintentional.

## 2020-10-29 ENCOUNTER — Telehealth: Payer: Self-pay | Admitting: Adult Health

## 2020-10-29 ENCOUNTER — Inpatient Hospital Stay: Payer: Medicare Other | Admitting: Adult Health

## 2020-10-29 NOTE — Telephone Encounter (Signed)
LVM, [facility] [place]), Transportation fell through needed to reschedule appt

## 2020-10-29 NOTE — Telephone Encounter (Signed)
Spoke to christie at [facility], needs am appt per transportation.  R/S from 10-29-20 missed appt.

## 2020-10-29 NOTE — Progress Notes (Deleted)
[facility] [address] [place]. [place] [address] [phone]       HOSPITAL FOLLOW UP NOTE  [name] Date of Birth:  01/28/1946 Medical Record Number:  [id number]   Reason for Referral:  hospital stroke follow up    SUBJECTIVE:   CHIEF COMPLAINT:  No chief complaint on file.   HPI:   [name] is a 74 y.o. female with history of atrial fibrillation, HTN, DM andstroke who presented on 07/26/2020 with right gaze deviation, left-sidedhemiplegiaand dense receptive and expressive aphasia.  Personally reviewed recent hospitalization pertinent progress notes, lab work and imaging with summary provided below.  Stroke work-up revealed R MCA and ACA infarct due to R ICA occlusion s/p tPA and IR with TICI 2C-3, embolic secondary to known AF not on AC over the past week.  Restart Eliquis for secondary stroke prevention and history of atrial fibrillation.  Continue Crestor for HLD with LDL 90.  Uncontrolled DM with A1c 12.7 with hypoglycemic episodes during admission.  Prior history of L pontine stroke 06/2018 secondary to small vessel disease.  Hospital course complicated by sepsis likely secondary to C. Difficile, microcytic anemia, A. fib with RVR, AKI and hyponatremia.  Residual stroke deficits include dysphagia s/p PEG placement, nonverbal, right gaze preference with peripheral vision deficit, left, left lower facial weakness, and left hemiplegia.  Evaluated by therapy and discharged to SNF for ongoing therapy needs.  Stroke:  R MCA and ACA infarct due to R tICA occlusion s/p tPA and IR w/ [id number], embolic secondary to known AF but not on AC for at least a week  Code Stroke CT head No acute abnormality.     CTA head & neck R tICA occlusion w/ min MCA flow. Some narrowing B VA and BA.   CT perfusion R frontal core 16 cc with [id number] R MCA territory  Cerebral angio RT ICA T occlusion ->TICI 2C revascularization of RT MCA and a TICI 3 revascularization of RT  ACA .  CT head post IR - new R frontal lobe hyper and hypointensity (infarct, edema, petechial hemorrhage)  MRI right MCA and ACA multifocal large infarcts with petechial hemo  CT repeat stable right MCA multifocal infarcts, minimal MLS.  2D Echo 07/03/2020 EF 65-70%. No source of embolus   LDL 90  HgbA1c 12.7  VTE prophylaxis - SCDs   Eliquis (apixaban) daily prior to admission, switch ASA to eliquis today.   Therapy recommendations: SNF  Disposition:  SNF  Today, 10/22/2020, Samantha Paul is being seen for hospital follow-up from admission on 07/26/2020-08/23/2020.       ROS:   14 system review of systems performed and negative with exception of ***  PMH:  Past Medical History:  Diagnosis Date  . Afib ([place]) 06/2020   Diagnosed 06/2020, on cardizem, metoprolol, eliquis  . Back pain   . CVA (cerebral vascular accident) ([place]) 07/26/2020   R MCA/ACA CVA  . DM II (diabetes mellitus, type II), controlled ([place])   . HTN (hypertension)   . Knee pain   . Stroke [facility]) ?   residual mild right sided weakness     PSH:  Past Surgical History:  Procedure Laterality Date  . CHOLECYSTECTOMY N/A 06/30/2020   Procedure: LAPAROSCOPIC CHOLECYSTECTOMY;  Surgeon: Georganna Skeans, MD;  Location: [place];  Service: General;  Laterality: N/A;  . ESOPHAGOGASTRODUODENOSCOPY N/A 08/18/2020   Procedure: ESOPHAGOGASTRODUODENOSCOPY (EGD);  Surgeon: Georganna Skeans, MD;  Location: [place];  Service: Endoscopy;  Laterality: N/A;  .  IR ANGIO VERTEBRAL SEL SUBCLAVIAN INNOMINATE UNI R MOD SED  07/29/2020  . IR PERCUTANEOUS ART THROMBECTOMY/INFUSION INTRACRANIAL INC DIAG ANGIO  07/26/2020  . PEG PLACEMENT N/A 08/18/2020   Procedure: PERCUTANEOUS ENDOSCOPIC GASTROSTOMY (PEG) PLACEMENT;  Surgeon: Georganna Skeans, MD;  Location: [address];  Service: Endoscopy;  Laterality: N/A;  . RADIOLOGY WITH ANESTHESIA N/A 07/26/2020   Procedure: IR WITH ANESTHESIA;  Surgeon: Radiologist, Medication, MD;  Location: [place];   Service: Radiology;  Laterality: N/A;    Social History:  Social History   Socioeconomic History  . Marital status: Married    Spouse name: Not on file  . Number of children: Not on file  . Years of education: Not on file  . Highest education level: Not on file  Occupational History  . Not on file  Tobacco Use  . Smoking status: Never Smoker  . Smokeless tobacco: Never Used  Substance and Sexual Activity  . Alcohol use: Not Currently  . Drug use: Not Currently  . Sexual activity: Not on file  Other Topics Concern  . Not on file  Social History Narrative  . Not on file   Social Determinants of Health   Financial Resource Strain:   . Difficulty of Paying Living Expenses: Not on file  Food Insecurity:   . Worried About Charity fundraiser in the Last Year: Not on file  . Ran Out of Food in the Last Year: Not on file  Transportation Needs:   . Lack of Transportation (Medical): Not on file  . Lack of Transportation (Non-Medical): Not on file  Physical Activity:   . Days of Exercise per Week: Not on file  . Minutes of Exercise per Session: Not on file  Stress:   . Feeling of Stress : Not on file  Social Connections:   . Frequency of Communication with Friends and Family: Not on file  . Frequency of Social Gatherings with Friends and Family: Not on file  . Attends Religious Services: Not on file  . Active Member of Clubs or Organizations: Not on file  . Attends Archivist Meetings: Not on file  . Marital Status: Not on file  Intimate Partner Violence:   . Fear of Current or Ex-Partner: Not on file  . Emotionally Abused: Not on file  . Physically Abused: Not on file  . Sexually Abused: Not on file    Family History:  Family History  Problem Relation Age of Onset  . Arthritis Brother     Medications:   Current Outpatient Medications on File Prior to Visit  Medication Sig Dispense Refill  . acetaminophen (TYLENOL) 160 MG/5ML solution Place 20.3 mLs (650  mg total) into feeding tube every 4 (four) hours as needed for mild pain (or temp > 37.5 C (99.5 F)). 120 mL 0  . acetaminophen (TYLENOL) 325 MG tablet Place 650 mg into feeding tube every 8 (eight) hours as needed for moderate pain.    Marland Kitchen apixaban (ELIQUIS) 5 MG TABS tablet Take 1 tablet (5 mg total) by mouth 2 (two) times daily. 60 tablet 3  . bethanechol (URECHOLINE) 10 MG tablet Place 1 tablet (10 mg total) into feeding tube 3 (three) times daily.    . blood glucose meter kit and supplies KIT Dispense based on patient and insurance preference. Use up to four times daily as directed. (FOR ICD-10--E11.9). 1 each 0  . diltiazem (CARDIZEM) 60 MG tablet Place 60 mg into feeding tube 4 (four) times daily.    Marland Kitchen  ferrous sulfate 300 (60 Fe) MG/5ML syrup Place 5 mLs (300 mg total) into feeding tube 3 (three) times daily. 150 mL 3  . insulin aspart (NOVOLOG) 100 UNIT/ML injection CBG 70 - 120: 0 units CBG 121 - 150: 1 unit CBG 151 - 200: 2 units CBG 201 - 250: 3 units CBG 251 - 300: 5 units CBG 301 - 350: 7 units CBG 351 - 400: 9 units (Patient taking differently: Inject 0-9 Units into the skin. CBG 0 - 59 : Notify MD CBG 60 - 150: 0 units CBG 151 - 199: 2 units CBG 200 - 249: 4 units CBG 250 - 299: 6 units CBG 300 - 349: 8 units CBG 350 - 399: 10 units CBG [phone] : Notify MD) 10 mL 11  . insulin glargine (LANTUS) 100 UNIT/ML injection Inject 0.22 mLs (22 Units total) into the skin daily. 10 mL 11  . Insulin Pen Needle (PEN NEEDLES) 32G X 4 MM MISC Use as directed with insulin pen 100 each 11  . metFORMIN (GLUCOPHAGE) 500 MG tablet Take 1 tablet (500 mg total) by mouth 2 (two) times daily with a meal. 60 tablet 11  . methylphenidate (RITALIN) 5 MG tablet Take 5 mg by mouth 2 (two) times daily.    . metoprolol tartrate (LOPRESSOR) 50 MG tablet Place 1 tablet (50 mg total) into feeding tube 2 (two) times daily.    . Nutritional Supplements (FEEDING SUPPLEMENT, JEVITY 1.2 CAL,) LIQD Place 1,000 mLs  into feeding tube continuous.  0  . rosuvastatin (CRESTOR) 20 MG tablet Place 1 tablet (20 mg total) into feeding tube daily.    Marland Kitchen senna (SENOKOT) 8.6 MG tablet Take 2 tablets by mouth daily.    . Water For Irrigation, Sterile (FREE WATER) SOLN Place 100 mLs into feeding tube every 8 (eight) hours.     No current facility-administered medications on file prior to visit.    Allergies:   Allergies  Allergen Reactions  . Metformin And Related Diarrhea    Abdominal pain and diarrhea      OBJECTIVE:  Physical Exam  There were no vitals filed for this visit. There is no height or weight on file to calculate BMI. No exam data present  Depression screen [facility] 2/9 08/01/2018  Decreased Interest 0  Down, Depressed, Hopeless 0  PHQ - 2 Score 0  Altered sleeping 0  Tired, decreased energy 1  Change in appetite 0  Feeling bad or failure about yourself  0  Trouble concentrating 0  Moving slowly or fidgety/restless 0  Suicidal thoughts 0  PHQ-9 Score 1  Difficult doing work/chores Somewhat difficult     General: well developed, well nourished, seated, in no evident distress Head: head normocephalic and atraumatic.   Neck: supple with no carotid or supraclavicular bruits Cardiovascular: regular rate and rhythm, no murmurs Musculoskeletal: no deformity Skin:  no rash/petichiae Vascular:  Normal pulses all extremities   Neurologic Exam Mental Status: Awake and fully alert. Oriented to place and time. Recent and remote memory intact. Attention span, concentration and fund of knowledge appropriate. Mood and affect appropriate.  Cranial Nerves: Fundoscopic exam reveals sharp disc margins. Pupils equal, briskly reactive to light. Extraocular movements full without nystagmus. Visual fields full to confrontation. Hearing intact. Facial sensation intact. Face, tongue, palate moves normally and symmetrically.  Motor: Normal bulk and tone. Normal strength in all tested extremity  muscles. Sensory.: intact to touch , pinprick , position and vibratory sensation.  Coordination: Rapid alternating movements normal  in all extremities. Finger-to-nose and heel-to-shin performed accurately bilaterally. Gait and Station: Arises from chair without difficulty. Stance is normal. Gait demonstrates normal stride length and balance Reflexes: 1+ and symmetric. Toes downgoing.     NIHSS  *** Modified Rankin  *** CHA2DS2-VASc *** HAS-BLED ***     ASSESSMENT: Samantha Paul is a 74 y.o. year old female presented with right gaze deviation, left-sided hemiplegia and dense receptive and expressive aphasia on 07/26/2020 with stroke work-up revealing R MCA and ACA infarcts due to R ICA occlusion s/p tPA and IR with TICI 2C-3 reperfusion, embolic secondary to known AF but not on AC for the past week.Vascular risk factors include hx of L pontine stroke secondary to small vessel disease in 06/2018, HTN, DM and atrial fibrillation.      PLAN:  1. R MCA and ACA stroke :  a. Discharged to SNF due to residual deficits.  Current residual deficit: ***.  b. Continue Eliquis (apixaban) daily  and Crestor 20 mg daily for secondary stroke prevention.  c. Discussed secondary stroke prevention measures and importance of close PCP follow up for aggressive stroke risk factor management  2. Atrial fibrillation: On Eliquis, diltiazem and metoprolol per cardiology 3. HTN: BP goal <130/90. Continue f/u with PCP 4. HLD: LDL goal <70. Recent LDL 90. F/u with PCP for management as well as prescribing of statin 5. DMII: A1c goal<7.0. Recent A1c 12.7. F/u with PCP    Follow up in *** or call earlier if needed   I spent *** minutes of face-to-face and non-face-to-face time with patient.  This included previsit chart review, lab review, study review, order entry, electronic health record documentation, patient education regarding recent stroke, residual deficits, importance of managing stroke risk factors and  answered all questions to patient satisfaction     Varick Keys McCue, AGNP-BC  [facility] [address] [place], [place] [address]  Phone [phone] Fax [phone] Note: This document was prepared with digital dictation and possible smart phrase technology. Any transcriptional errors that result from this process are unintentional.      

## 2020-11-04 ENCOUNTER — Inpatient Hospital Stay: Payer: Self-pay | Admitting: Adult Health

## 2020-11-04 NOTE — Progress Notes (Deleted)
[facility] [address] [place]. [place] [address] [phone]       HOSPITAL FOLLOW UP NOTE  [name] Date of Birth:  01/28/1946 Medical Record Number:  [id number]   Reason for Referral:  hospital stroke follow up    SUBJECTIVE:   CHIEF COMPLAINT:  No chief complaint on file.   HPI:   [name] is a 74 y.o. female with history of atrial fibrillation, HTN, DM andstroke who presented on 07/26/2020 with right gaze deviation, left-sidedhemiplegiaand dense receptive and expressive aphasia.  Personally reviewed recent hospitalization pertinent progress notes, lab work and imaging with summary provided below.  Stroke work-up revealed R MCA and ACA infarct due to R ICA occlusion s/p tPA and IR with TICI 2C-3, embolic secondary to known AF not on AC over the past week.  Restart Eliquis for secondary stroke prevention and history of atrial fibrillation.  Continue Crestor for HLD with LDL 90.  Uncontrolled DM with A1c 12.7 with hypoglycemic episodes during admission.  Prior history of L pontine stroke 06/2018 secondary to small vessel disease.  Hospital course complicated by sepsis likely secondary to C. Difficile, microcytic anemia, A. fib with RVR, AKI and hyponatremia.  Residual stroke deficits include dysphagia s/p PEG placement, nonverbal, right gaze preference with peripheral vision deficit, left, left lower facial weakness, and left hemiplegia.  Evaluated by therapy and discharged to SNF for ongoing therapy needs.  Stroke:  R MCA and ACA infarct due to R tICA occlusion s/p tPA and IR w/ [id number], embolic secondary to known AF but not on AC for at least a week  Code Stroke CT head No acute abnormality.     CTA head & neck R tICA occlusion w/ min MCA flow. Some narrowing B VA and BA.   CT perfusion R frontal core 16 cc with [id number] R MCA territory  Cerebral angio RT ICA T occlusion ->TICI 2C revascularization of RT MCA and a TICI 3 revascularization of RT  ACA .  CT head post IR - new R frontal lobe hyper and hypointensity (infarct, edema, petechial hemorrhage)  MRI right MCA and ACA multifocal large infarcts with petechial hemo  CT repeat stable right MCA multifocal infarcts, minimal MLS.  2D Echo 07/03/2020 EF 65-70%. No source of embolus   LDL 90  HgbA1c 12.7  VTE prophylaxis - SCDs   Eliquis (apixaban) daily prior to admission, switch ASA to eliquis today.   Therapy recommendations: SNF  Disposition:  SNF  Today, 10/22/2020, [name] is being seen for hospital follow-up from admission on 07/26/2020-08/23/2020.       ROS:   14 system review of systems performed and negative with exception of ***  PMH:  Past Medical History:  Diagnosis Date  . Afib ([place]) 06/2020   Diagnosed 06/2020, on cardizem, metoprolol, eliquis  . Back pain   . CVA (cerebral vascular accident) ([place]) 07/26/2020   R MCA/ACA CVA  . DM II (diabetes mellitus, type II), controlled ([place])   . HTN (hypertension)   . Knee pain   . Stroke [facility]) ?   residual mild right sided weakness     PSH:  Past Surgical History:  Procedure Laterality Date  . CHOLECYSTECTOMY N/A 06/30/2020   Procedure: LAPAROSCOPIC CHOLECYSTECTOMY;  Surgeon: Georganna Skeans, MD;  Location: [place];  Service: General;  Laterality: N/A;  . ESOPHAGOGASTRODUODENOSCOPY N/A 08/18/2020   Procedure: ESOPHAGOGASTRODUODENOSCOPY (EGD);  Surgeon: Georganna Skeans, MD;  Location: [place];  Service: Endoscopy;  Laterality: N/A;  .  IR ANGIO VERTEBRAL SEL SUBCLAVIAN INNOMINATE UNI R MOD SED  07/29/2020  . IR PERCUTANEOUS ART THROMBECTOMY/INFUSION INTRACRANIAL INC DIAG ANGIO  07/26/2020  . PEG PLACEMENT N/A 08/18/2020   Procedure: PERCUTANEOUS ENDOSCOPIC GASTROSTOMY (PEG) PLACEMENT;  Surgeon: Georganna Skeans, MD;  Location: [address];  Service: Endoscopy;  Laterality: N/A;  . RADIOLOGY WITH ANESTHESIA N/A 07/26/2020   Procedure: IR WITH ANESTHESIA;  Surgeon: Radiologist, Medication, MD;  Location: [place];   Service: Radiology;  Laterality: N/A;    Social History:  Social History   Socioeconomic History  . Marital status: Married    Spouse name: Not on file  . Number of children: Not on file  . Years of education: Not on file  . Highest education level: Not on file  Occupational History  . Not on file  Tobacco Use  . Smoking status: Never Smoker  . Smokeless tobacco: Never Used  Substance and Sexual Activity  . Alcohol use: Not Currently  . Drug use: Not Currently  . Sexual activity: Not on file  Other Topics Concern  . Not on file  Social History Narrative  . Not on file   Social Determinants of Health   Financial Resource Strain:   . Difficulty of Paying Living Expenses: Not on file  Food Insecurity:   . Worried About Charity fundraiser in the Last Year: Not on file  . Ran Out of Food in the Last Year: Not on file  Transportation Needs:   . Lack of Transportation (Medical): Not on file  . Lack of Transportation (Non-Medical): Not on file  Physical Activity:   . Days of Exercise per Week: Not on file  . Minutes of Exercise per Session: Not on file  Stress:   . Feeling of Stress : Not on file  Social Connections:   . Frequency of Communication with Friends and Family: Not on file  . Frequency of Social Gatherings with Friends and Family: Not on file  . Attends Religious Services: Not on file  . Active Member of Clubs or Organizations: Not on file  . Attends Archivist Meetings: Not on file  . Marital Status: Not on file  Intimate Partner Violence:   . Fear of Current or Ex-Partner: Not on file  . Emotionally Abused: Not on file  . Physically Abused: Not on file  . Sexually Abused: Not on file    Family History:  Family History  Problem Relation Age of Onset  . Arthritis Brother     Medications:   Current Outpatient Medications on File Prior to Visit  Medication Sig Dispense Refill  . acetaminophen (TYLENOL) 160 MG/5ML solution Place 20.3 mLs (650  mg total) into feeding tube every 4 (four) hours as needed for mild pain (or temp > 37.5 C (99.5 F)). 120 mL 0  . acetaminophen (TYLENOL) 325 MG tablet Place 650 mg into feeding tube every 8 (eight) hours as needed for moderate pain.    Marland Kitchen apixaban (ELIQUIS) 5 MG TABS tablet Take 1 tablet (5 mg total) by mouth 2 (two) times daily. 60 tablet 3  . bethanechol (URECHOLINE) 10 MG tablet Place 1 tablet (10 mg total) into feeding tube 3 (three) times daily.    . blood glucose meter kit and supplies KIT Dispense based on patient and insurance preference. Use up to four times daily as directed. (FOR ICD-10--E11.9). 1 each 0  . diltiazem (CARDIZEM) 60 MG tablet Place 60 mg into feeding tube 4 (four) times daily.    Marland Kitchen  ferrous sulfate 300 (60 Fe) MG/5ML syrup Place 5 mLs (300 mg total) into feeding tube 3 (three) times daily. 150 mL 3  . insulin aspart (NOVOLOG) 100 UNIT/ML injection CBG 70 - 120: 0 units CBG 121 - 150: 1 unit CBG 151 - 200: 2 units CBG 201 - 250: 3 units CBG 251 - 300: 5 units CBG 301 - 350: 7 units CBG 351 - 400: 9 units (Patient taking differently: Inject 0-9 Units into the skin. CBG 0 - 59 : Notify MD CBG 60 - 150: 0 units CBG 151 - 199: 2 units CBG 200 - 249: 4 units CBG 250 - 299: 6 units CBG 300 - 349: 8 units CBG 350 - 399: 10 units CBG [phone] : Notify MD) 10 mL 11  . insulin glargine (LANTUS) 100 UNIT/ML injection Inject 0.22 mLs (22 Units total) into the skin daily. 10 mL 11  . Insulin Pen Needle (PEN NEEDLES) 32G X 4 MM MISC Use as directed with insulin pen 100 each 11  . metFORMIN (GLUCOPHAGE) 500 MG tablet Take 1 tablet (500 mg total) by mouth 2 (two) times daily with a meal. 60 tablet 11  . methylphenidate (RITALIN) 5 MG tablet Take 5 mg by mouth 2 (two) times daily.    . metoprolol tartrate (LOPRESSOR) 50 MG tablet Place 1 tablet (50 mg total) into feeding tube 2 (two) times daily.    . Nutritional Supplements (FEEDING SUPPLEMENT, JEVITY 1.2 CAL,) LIQD Place 1,000 mLs  into feeding tube continuous.  0  . rosuvastatin (CRESTOR) 20 MG tablet Place 1 tablet (20 mg total) into feeding tube daily.    Marland Kitchen senna (SENOKOT) 8.6 MG tablet Take 2 tablets by mouth daily.    . Water For Irrigation, Sterile (FREE WATER) SOLN Place 100 mLs into feeding tube every 8 (eight) hours.     No current facility-administered medications on file prior to visit.    Allergies:   Allergies  Allergen Reactions  . Metformin And Related Diarrhea    Abdominal pain and diarrhea      OBJECTIVE:  Physical Exam  There were no vitals filed for this visit. There is no height or weight on file to calculate BMI. No exam data present  Depression screen [facility] 2/9 08/01/2018  Decreased Interest 0  Down, Depressed, Hopeless 0  PHQ - 2 Score 0  Altered sleeping 0  Tired, decreased energy 1  Change in appetite 0  Feeling bad or failure about yourself  0  Trouble concentrating 0  Moving slowly or fidgety/restless 0  Suicidal thoughts 0  PHQ-9 Score 1  Difficult doing work/chores Somewhat difficult     General: well developed, well nourished, seated, in no evident distress Head: head normocephalic and atraumatic.   Neck: supple with no carotid or supraclavicular bruits Cardiovascular: regular rate and rhythm, no murmurs Musculoskeletal: no deformity Skin:  no rash/petichiae Vascular:  Normal pulses all extremities   Neurologic Exam Mental Status: Awake and fully alert. Oriented to place and time. Recent and remote memory intact. Attention span, concentration and fund of knowledge appropriate. Mood and affect appropriate.  Cranial Nerves: Fundoscopic exam reveals sharp disc margins. Pupils equal, briskly reactive to light. Extraocular movements full without nystagmus. Visual fields full to confrontation. Hearing intact. Facial sensation intact. Face, tongue, palate moves normally and symmetrically.  Motor: Normal bulk and tone. Normal strength in all tested extremity  muscles. Sensory.: intact to touch , pinprick , position and vibratory sensation.  Coordination: Rapid alternating movements normal  in all extremities. Finger-to-nose and heel-to-shin performed accurately bilaterally. Gait and Station: Arises from chair without difficulty. Stance is normal. Gait demonstrates normal stride length and balance Reflexes: 1+ and symmetric. Toes downgoing.     NIHSS  *** Modified Rankin  ***      ASSESSMENT/PLAN: Torin Susan is a 74 y.o. year old female presented with right gaze deviation, left-sided hemiplegia and dense receptive and expressive aphasia on 07/26/2020 with stroke work-up revealing R MCA and ACA infarcts due to R ICA occlusion s/p tPA and IR with TICI 2C-3 reperfusion, embolic secondary to known AF but not on AC for the past week.Vascular risk factors include hx of L pontine stroke secondary to small vessel disease in 06/2018, HTN, DM and atrial fibrillation.      1. R MCA and ACA stroke :  a. Discharged to SNF due to residual deficits.  Current residual deficit: ***.  b. Continue Eliquis (apixaban) daily  and Crestor 20 mg daily for secondary stroke prevention.  c. Discussed secondary stroke prevention measures and importance of close PCP follow up for aggressive stroke risk factor management  2. Atrial fibrillation:  a. CHA2DS2-VASc score 6 indicating need for anticoagulation for secondary stroke prevention.   b. On Eliquis and metoprolol per cardiology 3. HTN: BP goal <130/90. On diltiazem per cardiology 4. HLD: LDL goal <70. Recent LDL 90. On Crestor 20 mg daily per PCP 5. DMII: A1c goal<7.0. Recent A1c 12.7. On Metformin, Lantus and NovoLog per PCP    Follow up in *** or call earlier if needed   I spent *** minutes of face-to-face and non-face-to-face time with patient.  This included previsit chart review, lab review, study review, order entry, electronic health record documentation, patient education regarding recent stroke, residual  deficits, importance of managing stroke risk factors and answered all questions to patient satisfaction     Frann Rider, [facility]  [facility] [address] [place], [place] [address]  Phone [phone] Fax [phone] Note: This document was prepared with digital dictation and possible smart phrase technology. Any transcriptional errors that result from this process are unintentional.

## 2020-11-11 ENCOUNTER — Encounter (HOSPITAL_COMMUNITY): Payer: Self-pay | Admitting: Emergency Medicine

## 2020-11-11 ENCOUNTER — Emergency Department (HOSPITAL_COMMUNITY): Payer: Medicare Other

## 2020-11-11 ENCOUNTER — Other Ambulatory Visit: Payer: Self-pay

## 2020-11-11 ENCOUNTER — Inpatient Hospital Stay (HOSPITAL_COMMUNITY)
Admission: EM | Admit: 2020-11-11 | Discharge: 2020-11-18 | DRG: 698 | Disposition: A | Discharge status: Hospice | Payer: Medicare Other | Attending: Internal Medicine | Admitting: Internal Medicine

## 2020-11-11 DIAGNOSIS — R6521 Severe sepsis with septic shock: Secondary | ICD-10-CM | POA: Diagnosis present

## 2020-11-11 DIAGNOSIS — I639 Cerebral infarction, unspecified: Secondary | ICD-10-CM | POA: Diagnosis present

## 2020-11-11 DIAGNOSIS — Z8261 Family history of arthritis: Secondary | ICD-10-CM | POA: Diagnosis not present

## 2020-11-11 DIAGNOSIS — R339 Retention of urine, unspecified: Secondary | ICD-10-CM | POA: Diagnosis present

## 2020-11-11 DIAGNOSIS — Z20822 Contact with and (suspected) exposure to covid-19: Secondary | ICD-10-CM | POA: Diagnosis present

## 2020-11-11 DIAGNOSIS — Z66 Do not resuscitate: Secondary | ICD-10-CM | POA: Diagnosis present

## 2020-11-11 DIAGNOSIS — Z978 Presence of other specified devices: Secondary | ICD-10-CM | POA: Diagnosis not present

## 2020-11-11 DIAGNOSIS — Z7189 Other specified counseling: Secondary | ICD-10-CM | POA: Diagnosis not present

## 2020-11-11 DIAGNOSIS — A419 Sepsis, unspecified organism: Secondary | ICD-10-CM | POA: Diagnosis present

## 2020-11-11 DIAGNOSIS — I69320 Aphasia following cerebral infarction: Secondary | ICD-10-CM

## 2020-11-11 DIAGNOSIS — R0600 Dyspnea, unspecified: Secondary | ICD-10-CM

## 2020-11-11 DIAGNOSIS — I63011 Cerebral infarction due to thrombosis of right vertebral artery: Secondary | ICD-10-CM

## 2020-11-11 DIAGNOSIS — N390 Urinary tract infection, site not specified: Secondary | ICD-10-CM | POA: Diagnosis present

## 2020-11-11 DIAGNOSIS — E871 Hypo-osmolality and hyponatremia: Secondary | ICD-10-CM | POA: Diagnosis not present

## 2020-11-11 DIAGNOSIS — I69354 Hemiplegia and hemiparesis following cerebral infarction affecting left non-dominant side: Secondary | ICD-10-CM

## 2020-11-11 DIAGNOSIS — I69391 Dysphagia following cerebral infarction: Secondary | ICD-10-CM

## 2020-11-11 DIAGNOSIS — Z794 Long term (current) use of insulin: Secondary | ICD-10-CM | POA: Diagnosis not present

## 2020-11-11 DIAGNOSIS — B964 Proteus (mirabilis) (morganii) as the cause of diseases classified elsewhere: Secondary | ICD-10-CM | POA: Diagnosis present

## 2020-11-11 DIAGNOSIS — I119 Hypertensive heart disease without heart failure: Secondary | ICD-10-CM | POA: Diagnosis present

## 2020-11-11 DIAGNOSIS — T83518A Infection and inflammatory reaction due to other urinary catheter, initial encounter: Principal | ICD-10-CM | POA: Diagnosis present

## 2020-11-11 DIAGNOSIS — Z7901 Long term (current) use of anticoagulants: Secondary | ICD-10-CM | POA: Diagnosis not present

## 2020-11-11 DIAGNOSIS — E119 Type 2 diabetes mellitus without complications: Secondary | ICD-10-CM | POA: Diagnosis present

## 2020-11-11 DIAGNOSIS — I480 Paroxysmal atrial fibrillation: Secondary | ICD-10-CM | POA: Diagnosis present

## 2020-11-11 DIAGNOSIS — I4891 Unspecified atrial fibrillation: Secondary | ICD-10-CM | POA: Diagnosis present

## 2020-11-11 DIAGNOSIS — Y846 Urinary catheterization as the cause of abnormal reaction of the patient, or of later complication, without mention of misadventure at the time of the procedure: Secondary | ICD-10-CM | POA: Diagnosis present

## 2020-11-11 DIAGNOSIS — Z79899 Other long term (current) drug therapy: Secondary | ICD-10-CM | POA: Diagnosis not present

## 2020-11-11 DIAGNOSIS — E1165 Type 2 diabetes mellitus with hyperglycemia: Secondary | ICD-10-CM | POA: Diagnosis present

## 2020-11-11 DIAGNOSIS — Z888 Allergy status to other drugs, medicaments and biological substances status: Secondary | ICD-10-CM

## 2020-11-11 DIAGNOSIS — Z515 Encounter for palliative care: Secondary | ICD-10-CM | POA: Diagnosis not present

## 2020-11-11 DIAGNOSIS — D638 Anemia in other chronic diseases classified elsewhere: Secondary | ICD-10-CM | POA: Diagnosis present

## 2020-11-11 LAB — GLUCOSE, CAPILLARY
Glucose-Capillary: 101 mg/dL — ABNORMAL HIGH (ref 70–99)
Glucose-Capillary: 188 mg/dL — ABNORMAL HIGH (ref 70–99)

## 2020-11-11 LAB — I-STAT CHEM 8, ED
BUN: 41 mg/dL — ABNORMAL HIGH (ref 8–23)
Calcium, Ion: 0.99 mmol/L — ABNORMAL LOW (ref 1.15–1.40)
Chloride: 102 mmol/L (ref 98–111)
Creatinine, Ser: 0.7 mg/dL (ref 0.44–1.00)
Glucose, Bld: 200 mg/dL — ABNORMAL HIGH (ref 70–99)
HCT: 39 % (ref 36.0–46.0)
Hemoglobin: 13.3 g/dL (ref 12.0–15.0)
Potassium: 4.8 mmol/L (ref 3.5–5.1)
Sodium: 133 mmol/L — ABNORMAL LOW (ref 135–145)
TCO2: 21 mmol/L — ABNORMAL LOW (ref 22–32)

## 2020-11-11 LAB — CBC WITH DIFFERENTIAL/PLATELET
Abs Immature Granulocytes: 0.05 10*3/uL (ref 0.00–0.07)
Basophils Absolute: 0 10*3/uL (ref 0.0–0.1)
Basophils Relative: 0 %
Eosinophils Absolute: 0.1 10*3/uL (ref 0.0–0.5)
Eosinophils Relative: 0 %
HCT: 39.7 % (ref 36.0–46.0)
Hemoglobin: 11.6 g/dL — ABNORMAL LOW (ref 12.0–15.0)
Immature Granulocytes: 0 %
Lymphocytes Relative: 12 %
Lymphs Abs: 1.6 10*3/uL (ref 0.7–4.0)
MCH: 24.5 pg — ABNORMAL LOW (ref 26.0–34.0)
MCHC: 29.2 g/dL — ABNORMAL LOW (ref 30.0–36.0)
MCV: 83.9 fL (ref 80.0–100.0)
Monocytes Absolute: 0.8 10*3/uL (ref 0.1–1.0)
Monocytes Relative: 6 %
Neutro Abs: 11 10*3/uL — ABNORMAL HIGH (ref 1.7–7.7)
Neutrophils Relative %: 82 %
Platelets: 269 10*3/uL (ref 150–400)
RBC: 4.73 MIL/uL (ref 3.87–5.11)
RDW: 14.6 % (ref 11.5–15.5)
WBC: 13.5 10*3/uL — ABNORMAL HIGH (ref 4.0–10.5)
nRBC: 0 % (ref 0.0–0.2)

## 2020-11-11 LAB — COMPREHENSIVE METABOLIC PANEL
ALT: 22 U/L (ref 0–44)
AST: 33 U/L (ref 15–41)
Albumin: 2.6 g/dL — ABNORMAL LOW (ref 3.5–5.0)
Alkaline Phosphatase: 56 U/L (ref 38–126)
Anion gap: 9 (ref 5–15)
BUN: 33 mg/dL — ABNORMAL HIGH (ref 8–23)
CO2: 24 mmol/L (ref 22–32)
Calcium: 9.1 mg/dL (ref 8.9–10.3)
Chloride: 100 mmol/L (ref 98–111)
Creatinine, Ser: 0.74 mg/dL (ref 0.44–1.00)
GFR, Estimated: >60 mL/min (ref >60)
Glucose, Bld: 105 mg/dL — ABNORMAL HIGH (ref 70–99)
Potassium: 4.8 mmol/L (ref 3.5–5.1)
Sodium: 133 mmol/L — ABNORMAL LOW (ref 135–145)
Total Bilirubin: 0.9 mg/dL (ref 0.3–1.2)
Total Protein: 6.3 g/dL — ABNORMAL LOW (ref 6.5–8.1)

## 2020-11-11 LAB — CBG MONITORING, ED
Glucose-Capillary: 156 mg/dL — ABNORMAL HIGH (ref 70–99)
Glucose-Capillary: 125 mg/dL — ABNORMAL HIGH (ref 70–99)
Glucose-Capillary: 84 mg/dL (ref 70–99)
Glucose-Capillary: 110 mg/dL — ABNORMAL HIGH (ref 70–99)

## 2020-11-11 LAB — URINALYSIS, ROUTINE W REFLEX MICROSCOPIC
Bilirubin Urine: NEGATIVE
Glucose, UA: NEGATIVE mg/dL
Hgb urine dipstick: NEGATIVE
Ketones, ur: NEGATIVE mg/dL
Nitrite: POSITIVE — AB
Protein, ur: 100 mg/dL — AB
Specific Gravity, Urine: 1.014 (ref 1.005–1.030)
WBC, UA: >50 WBC/hpf — ABNORMAL HIGH (ref 0–5)
pH: 9 — ABNORMAL HIGH (ref 5.0–8.0)

## 2020-11-11 LAB — RESPIRATORY PANEL BY RT PCR (FLU A&B, COVID)
Influenza A by PCR: NEGATIVE
Influenza B by PCR: NEGATIVE
SARS Coronavirus 2 by RT PCR: NEGATIVE

## 2020-11-11 LAB — LACTIC ACID, PLASMA: Lactic Acid, Venous: 4.3 mmol/L (ref 0.5–1.9)

## 2020-11-11 LAB — TROPONIN I (HIGH SENSITIVITY)
Troponin I (High Sensitivity): 11 ng/L (ref <18)
Troponin I (High Sensitivity): 13 ng/L (ref <18)

## 2020-11-11 LAB — LIPASE, BLOOD: Lipase: 30 U/L (ref 11–51)

## 2020-11-11 MED ORDER — SENNA 8.6 MG PO TABS
2.0000 | ORAL_TABLET | Freq: Every day | ORAL | Status: DC
  Administered 2020-11-11 – 2020-11-12 (×2): 17.2 mg
  Filled 2020-11-11 (×3): qty 2

## 2020-11-11 MED ORDER — LACTATED RINGERS IV BOLUS (SEPSIS)
1000.0000 mL | Freq: Once | INTRAVENOUS | Status: AC
  Administered 2020-11-11: 1000 mL via INTRAVENOUS

## 2020-11-11 MED ORDER — FREE WATER
100.0000 mL | Freq: Three times a day (TID) | Status: DC
  Administered 2020-11-11 – 2020-11-16 (×16): 100 mL

## 2020-11-11 MED ORDER — APIXABAN 5 MG PO TABS
5.0000 mg | ORAL_TABLET | Freq: Two times a day (BID) | ORAL | Status: DC
  Administered 2020-11-11 – 2020-11-15 (×10): 5 mg
  Filled 2020-11-11 (×10): qty 1

## 2020-11-11 MED ORDER — TRAMADOL HCL 50 MG PO TABS
50.0000 mg | ORAL_TABLET | Freq: Four times a day (QID) | ORAL | Status: DC | PRN
  Administered 2020-11-12 – 2020-11-17 (×7): 50 mg
  Filled 2020-11-11 (×9): qty 1

## 2020-11-11 MED ORDER — ACETAMINOPHEN 325 MG PO TABS
650.0000 mg | ORAL_TABLET | Freq: Four times a day (QID) | ORAL | Status: DC | PRN
  Administered 2020-11-11 (×2): 650 mg
  Filled 2020-11-11 (×2): qty 2

## 2020-11-11 MED ORDER — DILTIAZEM LOAD VIA INFUSION
10.0000 mg | Freq: Once | INTRAVENOUS | Status: AC
  Administered 2020-11-11: 10 mg via INTRAVENOUS
  Filled 2020-11-11: qty 10

## 2020-11-11 MED ORDER — INSULIN GLARGINE 100 UNIT/ML ~~LOC~~ SOLN
20.0000 [IU] | Freq: Every day | SUBCUTANEOUS | Status: DC
  Administered 2020-11-12 – 2020-11-15 (×4): 20 [IU] via SUBCUTANEOUS
  Filled 2020-11-11 (×6): qty 0.2

## 2020-11-11 MED ORDER — SODIUM CHLORIDE 0.9% FLUSH: 3.0000 mL | Freq: Two times a day (BID) | INTRAVENOUS | Status: DC

## 2020-11-11 MED ORDER — INSULIN GLARGINE 100 UNIT/ML ~~LOC~~ SOLN: 15.0000 [IU] | Freq: Every day | SUBCUTANEOUS | Status: DC

## 2020-11-11 MED ORDER — ZINC OXIDE 16 % EX OINT: 1.0000 "application " | TOPICAL_OINTMENT | Freq: Two times a day (BID) | CUTANEOUS | Status: DC

## 2020-11-11 MED ORDER — SODIUM CHLORIDE 0.9 % IV SOLN
2.0000 g | Freq: Once | INTRAVENOUS | Status: AC
  Administered 2020-11-11: 2 g via INTRAVENOUS
  Filled 2020-11-11: qty 2

## 2020-11-11 MED ORDER — ZINC OXIDE 20 % EX OINT
TOPICAL_OINTMENT | Freq: Two times a day (BID) | CUTANEOUS | Status: DC
  Administered 2020-11-11 – 2020-11-18 (×4): 1 via TOPICAL
  Filled 2020-11-11 (×2): qty 28.35

## 2020-11-11 MED ORDER — JEVITY 1.2 CAL PO LIQD
1000.0000 mL | ORAL | Status: DC
  Administered 2020-11-11: 1000 mL
  Filled 2020-11-11 (×2): qty 1000

## 2020-11-11 MED ORDER — BETHANECHOL CHLORIDE 10 MG PO TABS
10.0000 mg | ORAL_TABLET | Freq: Three times a day (TID) | ORAL | Status: DC
  Administered 2020-11-11 – 2020-11-18 (×21): 10 mg
  Filled 2020-11-11 (×25): qty 1

## 2020-11-11 MED ORDER — IOHEXOL 300 MG/ML  SOLN
100.0000 mL | Freq: Once | INTRAMUSCULAR | Status: AC | PRN
  Administered 2020-11-11: 100 mL via INTRAVENOUS

## 2020-11-11 MED ORDER — FENTANYL CITRATE (PF) 100 MCG/2ML IJ SOLN: 50.0000 ug | Freq: Once | INTRAMUSCULAR | Status: DC

## 2020-11-11 MED ORDER — ONDANSETRON HCL 4 MG PO TABS: 4.0000 mg | ORAL_TABLET | Freq: Four times a day (QID) | ORAL | Status: DC | PRN

## 2020-11-11 MED ORDER — OSMOLITE 1.2 CAL PO LIQD: 1000.0000 mL | ORAL | Status: DC

## 2020-11-11 MED ORDER — METOPROLOL TARTRATE 50 MG PO TABS
50.0000 mg | ORAL_TABLET | Freq: Two times a day (BID) | ORAL | Status: DC
  Administered 2020-11-11 – 2020-11-12 (×4): 50 mg
  Filled 2020-11-11 (×3): qty 1
  Filled 2020-11-11 (×2): qty 2

## 2020-11-11 MED ORDER — ROSUVASTATIN CALCIUM 20 MG PO TABS
20.0000 mg | ORAL_TABLET | Freq: Every day | ORAL | Status: DC
  Administered 2020-11-11 – 2020-11-16 (×6): 20 mg
  Filled 2020-11-11 (×5): qty 1

## 2020-11-11 MED ORDER — ONDANSETRON HCL 4 MG/2ML IJ SOLN
4.0000 mg | Freq: Four times a day (QID) | INTRAMUSCULAR | Status: DC | PRN
  Administered 2020-11-13 – 2020-11-15 (×2): 4 mg via INTRAVENOUS
  Filled 2020-11-11 (×2): qty 2

## 2020-11-11 MED ORDER — DICLOFENAC SODIUM 1 % EX GEL
2.0000 g | Freq: Three times a day (TID) | CUTANEOUS | Status: DC
  Administered 2020-11-11 – 2020-11-18 (×21): 2 g via TOPICAL
  Filled 2020-11-11 (×2): qty 100

## 2020-11-11 MED ORDER — SODIUM CHLORIDE 0.9 % IV SOLN
2.0000 g | INTRAVENOUS | Status: DC
  Administered 2020-11-12 – 2020-11-14 (×3): 2 g via INTRAVENOUS
  Filled 2020-11-11 (×3): qty 2

## 2020-11-11 MED ORDER — SODIUM CHLORIDE 0.9 % IV SOLN: INTRAVENOUS | Status: DC

## 2020-11-11 MED ORDER — SACCHAROMYCES BOULARDII 250 MG PO CAPS
250.0000 mg | ORAL_CAPSULE | Freq: Two times a day (BID) | ORAL | Status: DC
  Administered 2020-11-11 – 2020-11-16 (×11): 250 mg
  Filled 2020-11-11 (×12): qty 1

## 2020-11-11 MED ORDER — MORPHINE SULFATE (PF) 2 MG/ML IV SOLN
2.0000 mg | INTRAVENOUS | Status: DC | PRN
  Administered 2020-11-12 – 2020-11-16 (×15): 2 mg via INTRAVENOUS
  Filled 2020-11-11 (×16): qty 1

## 2020-11-11 MED ORDER — INSULIN ASPART 100 UNIT/ML ~~LOC~~ SOLN
0.0000 [IU] | SUBCUTANEOUS | Status: DC
  Administered 2020-11-11: 1 [IU] via SUBCUTANEOUS
  Administered 2020-11-11 – 2020-11-12 (×3): 2 [IU] via SUBCUTANEOUS
  Administered 2020-11-12 (×2): 1 [IU] via SUBCUTANEOUS
  Administered 2020-11-12 – 2020-11-13 (×4): 2 [IU] via SUBCUTANEOUS
  Administered 2020-11-13 (×2): 3 [IU] via SUBCUTANEOUS
  Administered 2020-11-14: 1 [IU] via SUBCUTANEOUS
  Administered 2020-11-14 – 2020-11-15 (×3): 2 [IU] via SUBCUTANEOUS
  Administered 2020-11-15 (×3): 1 [IU] via SUBCUTANEOUS
  Administered 2020-11-16 (×2): 2 [IU] via SUBCUTANEOUS
  Administered 2020-11-16: 3 [IU] via SUBCUTANEOUS

## 2020-11-11 MED ORDER — METHYLPHENIDATE HCL 5 MG PO TABS
5.0000 mg | ORAL_TABLET | Freq: Two times a day (BID) | ORAL | Status: DC
  Administered 2020-11-11 – 2020-11-16 (×11): 5 mg
  Filled 2020-11-11 (×11): qty 1

## 2020-11-11 MED ORDER — ACETAMINOPHEN 650 MG RE SUPP: 650.0000 mg | Freq: Four times a day (QID) | RECTAL | Status: DC | PRN

## 2020-11-11 MED ORDER — FENTANYL CITRATE (PF) 100 MCG/2ML IJ SOLN
50.0000 ug | INTRAMUSCULAR | Status: DC | PRN
  Filled 2020-11-11: qty 2

## 2020-11-11 MED ORDER — DILTIAZEM HCL-DEXTROSE 125-5 MG/125ML-% IV SOLN (PREMIX)
5.0000 mg/h | INTRAVENOUS | Status: DC
  Administered 2020-11-11: 5 mg/h via INTRAVENOUS
  Administered 2020-11-11 (×2): 15 mg/h via INTRAVENOUS
  Filled 2020-11-11 (×3): qty 125

## 2020-11-11 MED ORDER — CHLORHEXIDINE GLUCONATE CLOTH 2 % EX PADS
6.0000 | MEDICATED_PAD | Freq: Every day | CUTANEOUS | Status: DC
  Administered 2020-11-12 – 2020-11-18 (×7): 6 via TOPICAL

## 2020-11-11 MED ORDER — JEVITY 1.2 CAL PO LIQD
1000.0000 mL | ORAL | Status: DC
  Administered 2020-11-11: 1000 mL
  Filled 2020-11-11: qty 1000

## 2020-11-11 NOTE — Progress Notes (Signed)
Patient ID: Samantha Paul, female   DOB: 05/13/1946, 74 y.o.   MRN: [id number] Patient admitted early this morning for abdominal pain and was found to have sepsis secondary to UTI along with A. fib with RVR for which she was started on IV fluids, antibiotics and Cardizem drip.  Patient seen and examined at bedside.  I have reviewed patient's medical records including this morning's H&P, vitals, labs, medications myself.  Continue IV antibiotics and start normal saline at 125 cc an hour after IV fluid bolus.  I have consulted cardiology for A. fib with RVR.  Repeat a.m. labs.  Follow cultures.

## 2020-11-11 NOTE — ED Notes (Signed)
Report given to Amanda RN

## 2020-11-11 NOTE — ED Notes (Signed)
RN notified Dr. Hanley Ben of critical lactic 4.3

## 2020-11-11 NOTE — ED Notes (Signed)
CBG collected. Result "110." RN, Steward Drone, notified.

## 2020-11-11 NOTE — Consult Note (Signed)
Consultation Note Date: 11/11/2020   Patient Name: Samantha Paul  DOB: 09-Oct-1946  MRN: [id number]  Age / Sex: 74 y.o., female  PCP: Elwyn Reach, MD Referring Physician: Aline August, MD  Reason for Consultation: Establishing goals of care  HPI/Patient Profile: 74 y.o. female  with past medical history of atrial fibrillation on Eliquis, insulin-dependent diabetes mellitus, hypertension, and recent CVA. She presented to the emergency department from SNF on 11/11/2020 with complaint of abdominal pain.  ED Course: EKG shows a-fib with rapid rate (140's) - patient started on a diltiazem infusion. Chest x-ray negative for acute findings. CT abdomen/pelvis shows distended bladder with debris or hemorrhage within the bladder and significant volume loss of the left kidney likely reflecting prior infarction. Foley catheter was changed and is now draining, urinalysis concerning for infection.   Clinical Assessment and Goals of Care: I have reviewed medical records including EPIC notes, labs and imaging, and examined the patient. She is moaning and moving about restlessly in the bed.  There is no family currently at bedside.  Family speaks [place] Jarai--I have arranged for in-person interpreter services tomorrow at 1:00pm.  Patient was hospitalized 07/26/20 to 08/23/20 with a large right-sided CVA. She was treated with TPA and revascularization by IR on 07/26/20. She was intubated for this procedure and required prolonged mechanical ventilation due to poor neurologic status. She was extubated 08/08/20. Hospital course was complicated by Klebsiella pneumonia, C. Difficile, and potential pyelonephritis. An additional complication was urinary retention, requiring catheter placement. She also had dysphagia secondary to CVA, and PEG was placed for artificial feeding.   Palliative Medicine team was involved throughout this  previous hospitalization. Throughout multiple discussions, family continued to be open to all offered and available medical interventions to prolong life.   Patient is from [place]. 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 She was fully independent and very active up until the stroke.   Family consists of husband (Y-Yel Victoria) and 4 sons. He has previously stated to PMT that he wishes to include them in all medical decisions.    SUMMARY OF RECOMMENDATIONS   - full code full scope for now - Family meeting tomorrow 11/12/20 at 1:00 pm   Code Status/Advance Care Planning:  Full code   Palliative Prophylaxis:   Aspiration, Oral Care and Turn Reposition  Additional Recommendations (Limitations, Scope, Preferences):  Full Scope Treatment  Prognosis:   Unable to determine  Discharge Planning: To Be Determined      Primary Diagnoses: Present on Admission: . Atrial fibrillation with RVR ([place]) . CVA (cerebral vascular accident) ([place]) . Acute lower UTI   I have reviewed the medical record, interviewed the patient and family, and examined the patient. The following aspects are pertinent.  Past Medical History:  Diagnosis Date  . Afib ([place]) 06/2020   Diagnosed 06/2020, on cardizem, metoprolol, eliquis  . Back pain   . CVA (cerebral vascular accident) ([place]) 07/26/2020   R MCA/ACA CVA  . DM II (diabetes mellitus, type II), controlled ([place])   .  HTN (hypertension)   . Knee pain   . Stroke [facility]) ?   residual mild right sided weakness     Family History  Problem Relation Age of Onset  . Arthritis Brother    Scheduled Meds: . apixaban  5 mg Per Tube BID  . bethanechol  10 mg Per Tube TID  . diclofenac Sodium  2 g Topical TID  . free water  100 mL Per Tube Q8H  . insulin aspart  0-9 Units Subcutaneous Q4H  . insulin glargine  20 Units Subcutaneous QHS  . methylphenidate  5 mg Per Tube BID  . metoprolol tartrate  50 mg Per Tube BID  . rosuvastatin  20 mg Per  Tube Daily  . saccharomyces boulardii  250 mg Per Tube BID  . senna  2 tablet Per Tube Daily  . zinc oxide   Topical BID   Continuous Infusions: . sodium chloride 125 mL/hr at 11/11/20 1103  . [START ON 11/12/2020] ceFEPime (MAXIPIME) IV    . diltiazem (CARDIZEM) infusion 15 mg/hr (11/11/20 1515)  . feeding supplement (JEVITY 1.2 CAL) Stopped (11/11/20 1204)   PRN Meds:.acetaminophen **OR** acetaminophen, morphine injection, ondansetron **OR** ondansetron (ZOFRAN) IV, traMADol Medications Prior to Admission:  Prior to Admission medications   Medication Sig Start Date End Date Taking? Authorizing Provider  acetaminophen (TYLENOL) 160 MG/5ML solution Place 20.3 mLs (650 mg total) into feeding tube every 4 (four) hours as needed for mild pain (or temp > 37.5 C (99.5 F)). 08/23/20  Yes Hosie Poisson, MD  acetaminophen (TYLENOL) 325 MG tablet Place 650 mg into feeding tube every 8 (eight) hours as needed for moderate pain.   Yes [provider]  apixaban (ELIQUIS) 5 MG TABS tablet Take 1 tablet (5 mg total) by mouth 2 (two) times daily. Patient taking differently: Place 5 mg into feeding tube 2 (two) times daily.  07/03/20  Yes Oswald Hillock, MD  bethanechol (URECHOLINE) 10 MG tablet Place 1 tablet (10 mg total) into feeding tube 3 (three) times daily. 08/23/20  Yes Hosie Poisson, MD  diclofenac Sodium (VOLTAREN) 1 % GEL Apply 2 g topically in the morning, at noon, and at bedtime.  11/03/20  Yes [provider]  diltiazem (CARDIZEM) 60 MG tablet Place 60 mg into feeding tube 4 (four) times daily.   Yes [provider]  ferrous sulfate 300 (60 Fe) MG/5ML syrup Place 5 mLs (300 mg total) into feeding tube 3 (three) times daily. 08/23/20  Yes Hosie Poisson, MD  gabapentin (NEURONTIN) 100 MG capsule 100 mg every 12 (twelve) hours. Peg tube 11/01/20  Yes [provider]  insulin aspart (NOVOLOG) 100 UNIT/ML injection CBG 70 - 120: 0 units CBG 121 - 150: 1 unit CBG 151 -  200: 2 units CBG 201 - 250: 3 units CBG 251 - 300: 5 units CBG 301 - 350: 7 units CBG 351 - 400: 9 units Patient taking differently: Inject 0-9 Units into the skin.  08/23/20  Yes Hosie Poisson, MD  LANTUS SOLOSTAR 100 UNIT/ML Solostar Pen Inject 22 Units into the skin daily.  10/28/20  Yes [provider]  metFORMIN (GLUCOPHAGE) 500 MG tablet Take 1 tablet (500 mg total) by mouth 2 (two) times daily with a meal. Patient taking differently: Place 500 mg into feeding tube 2 (two) times daily with a meal.  07/03/20 07/03/21 Yes Darrick Meigs, Marge Duncans, MD  methylphenidate (RITALIN) 5 MG tablet Place 5 mg into feeding tube 2 (two) times daily.  Yes [provider]  metoprolol tartrate (LOPRESSOR) 50 MG tablet Place 1 tablet (50 mg total) into feeding tube 2 (two) times daily. 08/23/20  Yes Hosie Poisson, MD  Nutritional Supplements (FEEDING SUPPLEMENT, JEVITY 1.2 CAL,) LIQD Place 1,000 mLs into feeding tube continuous. 08/23/20  Yes Hosie Poisson, MD  Pollen Extracts (PROSTAT PO) 1 packet by PEG Tube route in the morning and at bedtime.   Yes [provider]  rosuvastatin (CRESTOR) 20 MG tablet Place 1 tablet (20 mg total) into feeding tube daily. 08/24/20  Yes Hosie Poisson, MD  saccharomyces boulardii (FLORASTOR) 250 MG capsule Place 250 mg into feeding tube 2 (two) times daily.   Yes [provider]  senna (SENOKOT) 8.6 MG tablet Place 2 tablets into feeding tube daily.    Yes [provider]  traMADol (ULTRAM) 50 MG tablet Place 50 mg into feeding tube every 8 (eight) hours.  11/06/20  Yes [provider]  Water For Irrigation, Sterile (FREE WATER) SOLN Place 100 mLs into feeding tube every 8 (eight) hours. 08/23/20  Yes Hosie Poisson, MD  Zinc Oxide 16 % OINT Apply 1 application topically in the morning and at bedtime.   Yes [provider]  blood glucose meter kit and supplies KIT Dispense based on patient and insurance preference. Use up to four  times daily as directed. (FOR ICD-10--E11.9). 07/25/18   Love, Ivan Anchors, PA-C  insulin glargine (LANTUS) 100 UNIT/ML injection Inject 0.22 mLs (22 Units total) into the skin daily. Patient not taking: Reported on 11/11/2020 09/23/20   Terrilee Croak, MD  Insulin Pen Needle (PEN NEEDLES) 32G X 4 MM MISC Use as directed with insulin pen 07/03/20   Oswald Hillock, MD   Allergies  Allergen Reactions  . Metformin And Related Diarrhea    Abdominal pain and diarrhea   Review of Systems  Unable to perform ROS: Patient nonverbal    Physical Exam Constitutional:      General: She is not in acute distress.    Appearance: She is ill-appearing.     Comments: Patient is moaning   HENT:     Head: Normocephalic and atraumatic.  Cardiovascular:     Comments: A-fib on monitor Pulmonary:     Effort: Pulmonary effort is normal.  Neurological:     Comments: Non-verbal      Vital Signs: BP 111/62   Pulse (!) 113   Temp 98.9 F (37.2 C) (Oral)   Resp 17   Ht 5' 2"  (1.575 m)   Wt 54.9 kg   SpO2 100%   BMI 22.13 kg/m  Pain Scale: Faces       SpO2: SpO2: 100 % O2 Device:SpO2: 100 % O2 Flow Rate: .   IO: Intake/output summary:   Intake/Output Summary (Last 24 hours) at 11/11/2020 1543 Last data filed at 11/11/2020 1342 Gross per 24 hour  Intake 1144.25 ml  Output 600 ml  Net 544.25 ml    LBM:   Baseline Weight: Weight: 54.9 kg Most recent weight: Weight: 54.9 kg      Palliative Assessment/Data: PPS 20%     Time In: 16:00 Time Out: 16:30 Time Total: 30 minutes Greater than 50%  of this time was spent counseling and coordinating care related to the above assessment and plan.  Signed by: Lavena Bullion, NP   Please contact Palliative Medicine Team phone at [phone] for questions and concerns.  For individual provider: See Shea Evans

## 2020-11-11 NOTE — ED Provider Notes (Signed)
[facility] EMERGENCY DEPARTMENT Provider Note   CSN: [id number] Arrival date & time: 11/11/20  0330     History Chief Complaint  Patient presents with  . Abdominal Pain    Samantha Paul is a 74 y.o. female.  The history is provided by the EMS personnel. The history is limited by the condition of the patient (non verbal ).  Abdominal Pain Pain location:  Generalized Pain radiates to:  Does not radiate Pain severity:  Moderate Onset quality:  Gradual Duration:  1 day Timing:  Constant Progression:  Unchanged Chronicity:  New Context: not recent travel and not trauma   Relieved by:  Nothing Worsened by:  Nothing Ineffective treatments:  None tried Associated symptoms: no fever   Risk factors: being elderly        Past Medical History:  Diagnosis Date  . Afib ([place]) 06/2020   Diagnosed 06/2020, on cardizem, metoprolol, eliquis  . Back pain   . CVA (cerebral vascular accident) ([place]) 07/26/2020   R MCA/ACA CVA  . DM II (diabetes mellitus, type II), controlled ([place])   . HTN (hypertension)   . Knee pain   . Stroke [facility]) ?   residual mild right sided weakness     Patient Active Problem List   Diagnosis Date Noted  . Dysphagia due to recent cerebrovascular accident   . Acute pyelonephritis 08/17/2020  . C. difficile colitis 08/16/2020  . Palliative care by specialist   . DNR (do not resuscitate) discussion   . Acute respiratory failure with hypoxia ([place])   . Stroke ([place]) 07/26/2020  . Stroke due to embolism ([place]) 07/26/2020  . Middle cerebral artery embolism, right 07/26/2020  . Sepsis ([place]) 06/29/2020  . DKA (diabetic ketoacidoses) 06/29/2020  . ARF (acute renal failure) ([place]) 06/29/2020  . Hemiparesis affecting right side as late effect of stroke ([place])   . Acute blood loss anemia   . Left pontine stroke ([place]) 07/13/2018  . Essential hypertension   . Diabetes mellitus type 2 in nonobese (HCC)   . CVA (cerebral vascular accident) ([place])  07/10/2018  . Hypertensive urgency 07/10/2018  . Hyperglycemia 07/10/2018  . Weakness generalized 07/10/2018  . Hyperlipidemia     Past Surgical History:  Procedure Laterality Date  . CHOLECYSTECTOMY N/A 06/30/2020   Procedure: LAPAROSCOPIC CHOLECYSTECTOMY;  Surgeon: Georganna Skeans, MD;  Location: [facility];  Service: General;  Laterality: N/A;  . ESOPHAGOGASTRODUODENOSCOPY N/A 08/18/2020   Procedure: ESOPHAGOGASTRODUODENOSCOPY (EGD);  Surgeon: Georganna Skeans, MD;  Location: [facility] [facility];  Service: Endoscopy;  Laterality: N/A;  . IR ANGIO VERTEBRAL SEL SUBCLAVIAN INNOMINATE UNI R MOD SED  07/29/2020  . IR PERCUTANEOUS ART THROMBECTOMY/INFUSION INTRACRANIAL INC DIAG ANGIO  07/26/2020  . PEG PLACEMENT N/A 08/18/2020   Procedure: PERCUTANEOUS ENDOSCOPIC GASTROSTOMY (PEG) PLACEMENT;  Surgeon: Georganna Skeans, MD;  Location: [facility];  Service: Endoscopy;  Laterality: N/A;  . RADIOLOGY WITH ANESTHESIA N/A 07/26/2020   Procedure: IR WITH ANESTHESIA;  Surgeon: Radiologist, Medication, MD;  Location: [facility];  Service: Radiology;  Laterality: N/A;     OB History   No obstetric history on file.     Family History  Problem Relation Age of Onset  . Arthritis Brother     Social History   Tobacco Use  . Smoking status: Never Smoker  . Smokeless tobacco: Never Used  Substance Use Topics  . Alcohol use: Not Currently  . Drug use: Not Currently    Home Medications Prior to Admission medications   Medication Sig Start  Date End Date Taking? Authorizing Provider  acetaminophen (TYLENOL) 160 MG/5ML solution Place 20.3 mLs (650 mg total) into feeding tube every 4 (four) hours as needed for mild pain (or temp > 37.5 C (99.5 F)). 08/23/20   Hosie Poisson, MD  acetaminophen (TYLENOL) 325 MG tablet Place 650 mg into feeding tube every 8 (eight) hours as needed for moderate pain.    [provider]  apixaban (ELIQUIS) 5 MG TABS tablet Take 1 tablet (5 mg total) by mouth 2 (two) times daily. 07/03/20    Oswald Hillock, MD  bethanechol (URECHOLINE) 10 MG tablet Place 1 tablet (10 mg total) into feeding tube 3 (three) times daily. 08/23/20   Hosie Poisson, MD  blood glucose meter kit and supplies KIT Dispense based on patient and insurance preference. Use up to four times daily as directed. (FOR ICD-10--E11.9). 07/25/18   Love, Ivan Anchors, PA-C  diltiazem (CARDIZEM) 60 MG tablet Place 60 mg into feeding tube 4 (four) times daily.    [provider]  ferrous sulfate 300 (60 Fe) MG/5ML syrup Place 5 mLs (300 mg total) into feeding tube 3 (three) times daily. 08/23/20   Hosie Poisson, MD  insulin aspart (NOVOLOG) 100 UNIT/ML injection CBG 70 - 120: 0 units CBG 121 - 150: 1 unit CBG 151 - 200: 2 units CBG 201 - 250: 3 units CBG 251 - 300: 5 units CBG 301 - 350: 7 units CBG 351 - 400: 9 units Patient taking differently: Inject 0-9 Units into the skin. CBG 0 - 59 : Notify MD CBG 60 - 150: 0 units CBG 151 - 199: 2 units CBG 200 - 249: 4 units CBG 250 - 299: 6 units CBG 300 - 349: 8 units CBG 350 - 399: 10 units CBG [phone] : Notify MD 08/23/20   Hosie Poisson, MD  insulin glargine (LANTUS) 100 UNIT/ML injection Inject 0.22 mLs (22 Units total) into the skin daily. 09/23/20   Terrilee Croak, MD  Insulin Pen Needle (PEN NEEDLES) 32G X 4 MM MISC Use as directed with insulin pen 07/03/20   Oswald Hillock, MD  metFORMIN (GLUCOPHAGE) 500 MG tablet Take 1 tablet (500 mg total) by mouth 2 (two) times daily with a meal. 07/03/20 07/03/21  Oswald Hillock, MD  methylphenidate (RITALIN) 5 MG tablet Take 5 mg by mouth 2 (two) times daily.    [provider]  metoprolol tartrate (LOPRESSOR) 50 MG tablet Place 1 tablet (50 mg total) into feeding tube 2 (two) times daily. 08/23/20   Hosie Poisson, MD  Nutritional Supplements (FEEDING SUPPLEMENT, JEVITY 1.2 CAL,) LIQD Place 1,000 mLs into feeding tube continuous. 08/23/20   Hosie Poisson, MD  rosuvastatin (CRESTOR) 20 MG tablet Place 1 tablet (20 mg total) into  feeding tube daily. 08/24/20   Hosie Poisson, MD  senna (SENOKOT) 8.6 MG tablet Take 2 tablets by mouth daily.    [provider]  Water For Irrigation, Sterile (FREE WATER) SOLN Place 100 mLs into feeding tube every 8 (eight) hours. 08/23/20   Hosie Poisson, MD    Allergies    Metformin and related  Review of Systems   Review of Systems  Unable to perform ROS: Patient nonverbal  Constitutional: Negative for fever.  Gastrointestinal: Positive for abdominal pain.    Physical Exam Updated Vital Signs BP (!) 123/91   Pulse (!) 130   Temp 98.2 F (36.8 C) (Axillary)   Resp (!) 26   SpO2 100%   Physical Exam Vitals and  nursing note reviewed.  Constitutional:      General: She is not in acute distress.    Appearance: Normal appearance.  HENT:     Head: Normocephalic and atraumatic.     Nose: Nose normal.  Eyes:     Conjunctiva/sclera: Conjunctivae normal.     Pupils: Pupils are equal, round, and reactive to light.  Cardiovascular:     Rate and Rhythm: Tachycardia present. Rhythm irregular.     Heart sounds: Normal heart sounds.  Pulmonary:     Effort: Pulmonary effort is normal.     Breath sounds: Normal breath sounds.  Abdominal:     General: There is distension.     Tenderness: There is no abdominal tenderness. There is no guarding.  Musculoskeletal:        General: Normal range of motion.     Cervical back: Normal range of motion and neck supple.  Skin:    General: Skin is warm and dry.     Capillary Refill: Capillary refill takes less than 2 seconds.  Neurological:     Mental Status: She is alert.     Deep Tendon Reflexes: Reflexes normal.  Psychiatric:     Comments: Unable      ED Results / Procedures / Treatments   Labs (all labs ordered are listed, but only abnormal results are displayed) Results for orders placed or performed during the hospital encounter of 11/11/20  CBC with Differential/Platelet  Result Value Ref Range   WBC 13.5 (H) 4.0 -  10.5 K/uL   RBC 4.73 3.87 - 5.11 MIL/uL   Hemoglobin 11.6 (L) 12.0 - 15.0 g/dL   HCT 39.7 36 - 46 %   MCV 83.9 80.0 - 100.0 fL   MCH 24.5 (L) 26.0 - 34.0 pg   MCHC 29.2 (L) 30.0 - 36.0 g/dL   RDW 14.6 11.5 - 15.5 %   Platelets 269 150 - 400 K/uL   nRBC 0.0 0.0 - 0.2 %   Neutrophils Relative % 82 %   Neutro Abs 11.0 (H) 1.7 - 7.7 K/uL   Lymphocytes Relative 12 %   Lymphs Abs 1.6 0.7 - 4.0 K/uL   Monocytes Relative 6 %   Monocytes Absolute 0.8 0.1 - 1.0 K/uL   Eosinophils Relative 0 %   Eosinophils Absolute 0.1 0.0 - 0.5 K/uL   Basophils Relative 0 %   Basophils Absolute 0.0 0.0 - 0.1 K/uL   Immature Granulocytes 0 %   Abs Immature Granulocytes 0.05 0.00 - 0.07 K/uL  I-stat chem 8, ED (not at [facility] or [facility])  Result Value Ref Range   Sodium 133 (L) 135 - 145 mmol/L   Potassium 4.8 3.5 - 5.1 mmol/L   Chloride 102 98 - 111 mmol/L   BUN 41 (H) 8 - 23 mg/dL   Creatinine, Ser 0.70 0.44 - 1.00 mg/dL   Glucose, Bld 200 (H) 70 - 99 mg/dL   Calcium, Ion 0.99 (L) 1.15 - 1.40 mmol/L   TCO2 21 (L) 22 - 32 mmol/L   Hemoglobin 13.3 12.0 - 15.0 g/dL   HCT 39.0 36 - 46 %  Troponin I (High Sensitivity)  Result Value Ref Range   Troponin I (High Sensitivity) 11 <18 ng/L   CT ABDOMEN PELVIS W CONTRAST  Result Date: 11/11/2020 CLINICAL DATA:  Nonlocalized abdominal pain EXAM: CT ABDOMEN AND PELVIS WITH CONTRAST TECHNIQUE: Multidetector CT imaging of the abdomen and pelvis was performed using the standard protocol following bolus administration of intravenous contrast. CONTRAST:  143m OMNIPAQUE IOHEXOL 300 MG/ML  SOLN COMPARISON:  08/14/2020 FINDINGS: Lower chest:  No contributory findings. Hepatobiliary: No focal liver abnormality.Cholecystectomy. No bile duct dilatation. Pancreas: Unremarkable. Spleen: Unremarkable. Adrenals/Urinary Tract: Negative adrenals. Significant decrease in size of the left kidney with no enhancement of the upper and interpolar region. Right renal cysts. Normal cortical  enhancement on the right. No hydronephrosis. Distended bladder with dependent high-density material. A Foley catheter is in appropriate position. Stomach/Bowel: No obstruction. No appendicitis. Percutaneous gastrostomy tube in expected position Vascular/Lymphatic: No acute vascular abnormality. Mild atheromatous plaque. No mass or adenopathy. Reproductive:No pathologic findings. Other: No ascites or pneumoperitoneum. Musculoskeletal: No acute abnormalities. Lumbar spine degeneration with mild L4-5 anterolisthesis and L1-2 retrolisthesis. IMPRESSION: 1. Distended bladder despite a Foley catheter, please correlate with catheter function. 2. Layering debris or hemorrhage within the bladder lumen. 3. Significant volume loss of the left kidney since prior with primarily nonenhancing parenchyma. Prior findings were likely related to infarct rather than infection. Electronically Signed   By: JMonte FantasiaM.D.   On: 11/11/2020 04:59   DG Chest Portable 1 View  Result Date: 11/11/2020 CLINICAL DATA:  Atrial fibrillation EXAM: PORTABLE CHEST 1 VIEW COMPARISON:  09/18/2020 FINDINGS: Lungs are clear. Mild cardiomegaly is stable. No pneumothorax or pleural effusion. Pulmonary vascularity is normal. No acute bone abnormality. IMPRESSION: No active disease.  Stable cardiomegaly. Electronically Signed   By: AFidela SalisburyMD   On: 11/11/2020 04:21    EKG EKG Interpretation  Date/Time:  Wednesday November 11 2020 03:42:15 EST Ventricular Rate:  145 PR Interval:    QRS Duration: 79 QT Interval:  306 QTC Calculation: 476 R Axis:   159 Text Interpretation: Atrial fibrillation Right axis deviation Consider left ventricular hypertrophy Probable lateral infarct, age indeterminate Borderline T abnormalities, lateral leads Confirmed by PRandal Buba Malillany Kazlauskas ([id number]) on 11/11/2020 3:58:46 AM   Radiology CT ABDOMEN PELVIS W CONTRAST  Result Date: 11/11/2020 CLINICAL DATA:  Nonlocalized abdominal pain EXAM: CT ABDOMEN AND  PELVIS WITH CONTRAST TECHNIQUE: Multidetector CT imaging of the abdomen and pelvis was performed using the standard protocol following bolus administration of intravenous contrast. CONTRAST:  1036mOMNIPAQUE IOHEXOL 300 MG/ML  SOLN COMPARISON:  08/14/2020 FINDINGS: Lower chest:  No contributory findings. Hepatobiliary: No focal liver abnormality.Cholecystectomy. No bile duct dilatation. Pancreas: Unremarkable. Spleen: Unremarkable. Adrenals/Urinary Tract: Negative adrenals. Significant decrease in size of the left kidney with no enhancement of the upper and interpolar region. Right renal cysts. Normal cortical enhancement on the right. No hydronephrosis. Distended bladder with dependent high-density material. A Foley catheter is in appropriate position. Stomach/Bowel: No obstruction. No appendicitis. Percutaneous gastrostomy tube in expected position Vascular/Lymphatic: No acute vascular abnormality. Mild atheromatous plaque. No mass or adenopathy. Reproductive:No pathologic findings. Other: No ascites or pneumoperitoneum. Musculoskeletal: No acute abnormalities. Lumbar spine degeneration with mild L4-5 anterolisthesis and L1-2 retrolisthesis. IMPRESSION: 1. Distended bladder despite a Foley catheter, please correlate with catheter function. 2. Layering debris or hemorrhage within the bladder lumen. 3. Significant volume loss of the left kidney since prior with primarily nonenhancing parenchyma. Prior findings were likely related to infarct rather than infection. Electronically Signed   By: JoMonte Fantasia.D.   On: 11/11/2020 04:59   DG Chest Portable 1 View  Result Date: 11/11/2020 CLINICAL DATA:  Atrial fibrillation EXAM: PORTABLE CHEST 1 VIEW COMPARISON:  09/18/2020 FINDINGS: Lungs are clear. Mild cardiomegaly is stable. No pneumothorax or pleural effusion. Pulmonary vascularity is normal. No acute bone abnormality. IMPRESSION: No active disease.  Stable cardiomegaly. Electronically Signed  By: Fidela Salisbury MD   On: 11/11/2020 04:21    Procedures Procedures (including critical care time)  Medications Ordered in ED Medications  diltiazem (CARDIZEM) 1 mg/mL load via infusion 10 mg (10 mg Intravenous Bolus from Bag 11/11/20 0452)    And  diltiazem (CARDIZEM) 125 mg in dextrose 5% 125 mL (1 mg/mL) infusion (5 mg/hr Intravenous New Bag/Given 11/11/20 0451)  iohexol (OMNIPAQUE) 300 MG/ML solution 100 mL (100 mLs Intravenous Contrast Given 11/11/20 0431)    ED Course  I have reviewed the triage vital signs and the nursing notes.  Pertinent labs & imaging results that were available during my care of the patient were reviewed by me and considered in my medical decision making (see chart for details).    MDM Rules/Calculators/A&P   MDM Reviewed: previous chart, nursing note and vitals Interpretation: labs, x-ray, ECG and CT scan (normal troponin NACPD by me on CXR, distented bladder by me on CT ) Total time providing critical care: 75-105 minutes (diltiazem drip ). This excludes time spent performing separately reportable procedures and services. Consults: admitting MD   CRITICAL CARE Performed by: Henlee Donovan K Jaynell Castagnola-Rasch Total critical care time: 75 minutes Critical care time was exclusive of separately billable procedures and treating other patients. Critical care was necessary to treat or prevent imminent or life-threatening deterioration. Critical care was time spent personally by me on the following activities: development of treatment plan with patient and/or surrogate as well as nursing, discussions with consultants, evaluation of patient's response to treatment, examination of patient, obtaining history from patient or surrogate, ordering and performing treatments and interventions, ordering and review of laboratory studies, ordering and review of radiographic studies, pulse oximetry and re-evaluation of patient's condition.     Final Clinical Impression(s) / ED  Diagnoses Final diagnoses:  Paroxysmal atrial fibrillation [facility])  Urinary retention    Admit to medicine    Zuma Hust, MD 11/11/20 2179

## 2020-11-11 NOTE — H&P (Signed)
History and Physical    Samantha Paul QBV:[id number] DOB: Dec 04, 1946 DOA: 11/11/2020  PCP: Elwyn Reach, MD   Patient coming from: SNF   Chief Complaint: Abdominal pain   HPI: Samantha Paul is a 74 y.o. female with medical history significant for atrial fibrillation on Eliquis, insulin-dependent diabetes mellitus, hypertension, and history of CVA with aphasia, dysphagia, neurogenic bladder, and motor deficits, now presenting to the emergency department from her SNF for evaluation of abdominal pain.  She is accompanied by her husband who assists with the history.  Patient's husband noticed that patient appeared to be in pain yesterday, was able to determine it was her abdomen that was bothering her, and she was sent to the ED for evaluation of this.  Patient is unable to provide much history due to her aphasia.  ED Course: Upon arrival to the ED, patient is found to be afebrile, saturating well on room air, slightly tachypneic, tachycardic in the 140s, and with stable blood pressure.  EKG features atrial fibrillation with rate 145.  Chest x-ray with stable cardiomegaly but no acute findings.  CT the abdomen and pelvis demonstrates distended bladder with layering debris or hemorrhage within the urinary bladder and significant volume loss of the left kidney likely reflecting a prior infarction.  Foley catheter was changed and is now draining, urinalysis is concerning for infection, patient is noted to have a leukocytosis to 13,500, was given 10 mg IV diltiazem, and started on diltiazem infusion.  Review of Systems:  All other systems reviewed and apart from HPI, are negative.  Past Medical History:  Diagnosis Date  . Afib ([place]) 06/2020   Diagnosed 06/2020, on cardizem, metoprolol, eliquis  . Back pain   . CVA (cerebral vascular accident) ([place]) 07/26/2020   R MCA/ACA CVA  . DM II (diabetes mellitus, type II), controlled ([place])   . HTN (hypertension)   . Knee pain   . Stroke [facility]) ?   residual mild  right sided weakness     Past Surgical History:  Procedure Laterality Date  . CHOLECYSTECTOMY N/A 06/30/2020   Procedure: LAPAROSCOPIC CHOLECYSTECTOMY;  Surgeon: Georganna Skeans, MD;  Location: [place];  Service: General;  Laterality: N/A;  . ESOPHAGOGASTRODUODENOSCOPY N/A 08/18/2020   Procedure: ESOPHAGOGASTRODUODENOSCOPY (EGD);  Surgeon: Georganna Skeans, MD;  Location: [place];  Service: Endoscopy;  Laterality: N/A;  . IR ANGIO VERTEBRAL SEL SUBCLAVIAN INNOMINATE UNI R MOD SED  07/29/2020  . IR PERCUTANEOUS ART THROMBECTOMY/INFUSION INTRACRANIAL INC DIAG ANGIO  07/26/2020  . PEG PLACEMENT N/A 08/18/2020   Procedure: PERCUTANEOUS ENDOSCOPIC GASTROSTOMY (PEG) PLACEMENT;  Surgeon: Georganna Skeans, MD;  Location: [place];  Service: Endoscopy;  Laterality: N/A;  . RADIOLOGY WITH ANESTHESIA N/A 07/26/2020   Procedure: IR WITH ANESTHESIA;  Surgeon: Radiologist, Medication, MD;  Location: [place];  Service: Radiology;  Laterality: N/A;    Social History:   reports that she has never smoked. She has never used smokeless tobacco. She reports previous alcohol use. She reports previous drug use.  Allergies  Allergen Reactions  . Metformin And Related Diarrhea    Abdominal pain and diarrhea    Family History  Problem Relation Age of Onset  . Arthritis Brother      Prior to Admission medications   Medication Sig Start Date End Date Taking? Authorizing Provider  acetaminophen (TYLENOL) 160 MG/5ML solution Place 20.3 mLs (650 mg total) into feeding tube every 4 (four) hours as needed for mild pain (or temp > 37.5 C (99.5 F)). 08/23/20   Karleen Hampshire,  Jeoffrey Massed, MD  acetaminophen (TYLENOL) 325 MG tablet Place 650 mg into feeding tube every 8 (eight) hours as needed for moderate pain.    [provider]  apixaban (ELIQUIS) 5 MG TABS tablet Take 1 tablet (5 mg total) by mouth 2 (two) times daily. 07/03/20   Oswald Hillock, MD  bethanechol (URECHOLINE) 10 MG tablet Place 1 tablet (10 mg total) into feeding  tube 3 (three) times daily. 08/23/20   Hosie Poisson, MD  blood glucose meter kit and supplies KIT Dispense based on patient and insurance preference. Use up to four times daily as directed. (FOR ICD-10--E11.9). 07/25/18   Love, Ivan Anchors, PA-C  diclofenac Sodium (VOLTAREN) 1 % GEL Apply topically. 11/03/20   [provider]  diltiazem (CARDIZEM SR) 60 MG 12 hr capsule SMARTSIG:1 Capsule(s) Gastro Tube 4 Times Daily 09/03/20   [provider]  diltiazem (CARDIZEM) 60 MG tablet Place 60 mg into feeding tube 4 (four) times daily.    [provider]  ferrous sulfate 300 (60 Fe) MG/5ML syrup Place 5 mLs (300 mg total) into feeding tube 3 (three) times daily. 08/23/20   Hosie Poisson, MD  gabapentin (NEURONTIN) 100 MG capsule Take by mouth. 11/01/20   [provider]  insulin aspart (NOVOLOG) 100 UNIT/ML injection CBG 70 - 120: 0 units CBG 121 - 150: 1 unit CBG 151 - 200: 2 units CBG 201 - 250: 3 units CBG 251 - 300: 5 units CBG 301 - 350: 7 units CBG 351 - 400: 9 units Patient taking differently: Inject 0-9 Units into the skin. CBG 0 - 59 : Notify MD CBG 60 - 150: 0 units CBG 151 - 199: 2 units CBG 200 - 249: 4 units CBG 250 - 299: 6 units CBG 300 - 349: 8 units CBG 350 - 399: 10 units CBG [phone] : Notify MD 08/23/20   Hosie Poisson, MD  insulin glargine (LANTUS) 100 UNIT/ML injection Inject 0.22 mLs (22 Units total) into the skin daily. 09/23/20   Terrilee Croak, MD  Insulin Pen Needle (PEN NEEDLES) 32G X 4 MM MISC Use as directed with insulin pen 07/03/20   Oswald Hillock, MD  LANTUS SOLOSTAR 100 UNIT/ML Solostar Pen Inject into the skin. 10/28/20   [provider]  metFORMIN (GLUCOPHAGE) 500 MG tablet Take 1 tablet (500 mg total) by mouth 2 (two) times daily with a meal. 07/03/20 07/03/21  Oswald Hillock, MD  methylphenidate (RITALIN) 5 MG tablet Take 5 mg by mouth 2 (two) times daily.    [provider]  metoprolol tartrate (LOPRESSOR) 50 MG tablet Place  1 tablet (50 mg total) into feeding tube 2 (two) times daily. 08/23/20   Hosie Poisson, MD  Nutritional Supplements (FEEDING SUPPLEMENT, JEVITY 1.2 CAL,) LIQD Place 1,000 mLs into feeding tube continuous. 08/23/20   Hosie Poisson, MD  rosuvastatin (CRESTOR) 20 MG tablet Place 1 tablet (20 mg total) into feeding tube daily. 08/24/20   Hosie Poisson, MD  senna (SENOKOT) 8.6 MG tablet Take 2 tablets by mouth daily.    [provider]  traMADol (ULTRAM) 50 MG tablet Take 50 mg by mouth 3 (three) times daily as needed. 11/06/20   [provider]  Water For Irrigation, Sterile (FREE WATER) SOLN Place 100 mLs into feeding tube every 8 (eight) hours. 08/23/20   Hosie Poisson, MD    Physical Exam: Vitals:   11/11/20 0336 11/11/20 0400 11/11/20 0544 11/11/20 0545  BP: (!) 150/128 (!) 123/91  (!) 137/114  Pulse: (!) 140 (!) 130 (!) 113   Resp: (!) 26 (!) _0 Temp: 98.2 F (36.8 C)     TempSrc: Axillary     SpO2: 100% 100% 100%      Constitutional: NAD, calm  Eyes: PERTLA, lids and conjunctivae normal ENMT: Mucous membranes are moist. Posterior pharynx clear of any exudate or lesions.   Neck: supple, no masses, no thyromegaly Respiratory:  no wheezing, no crackles. No accessory muscle use.  Cardiovascular: Rate ~120 and irregular. No extremity edema.   Abdomen: No distension, soft, suprapubic tenderness. Bowel sounds active.  Musculoskeletal: no clubbing / cyanosis. No joint deformity upper and lower extremities.   Skin: no significant rashes, lesions, ulcers. Warm, dry, well-perfused. Neurologic: Aphasic. Left arm flaccid. Sensation intact.   Psychiatric: Awake. Not speaking.     Labs and Imaging on Admission: I have personally reviewed following labs and imaging studies  CBC: Recent Labs  Lab 11/11/20 0338 11/11/20 0349  WBC 13.5*  --   NEUTROABS 11.0*  --   HGB 11.6* 13.3  HCT 39.7 39.0  MCV 83.9  --   PLT 269  --    Basic Metabolic Panel: Recent Labs  Lab  11/11/20 0349  NA 133*  K 4.8  CL 102  GLUCOSE 200*  BUN 41*  CREATININE 0.70   GFR: CrCl cannot be calculated (Unknown ideal weight.). Liver Function Tests: No results for input(s): AST, ALT, ALKPHOS, BILITOT, PROT, ALBUMIN in the last 168 hours. No results for input(s): LIPASE, AMYLASE in the last 168 hours. No results for input(s): AMMONIA in the last 168 hours. Coagulation Profile: No results for input(s): INR, PROTIME in the last 168 hours. Cardiac Enzymes: No results for input(s): CKTOTAL, CKMB, CKMBINDEX, TROPONINI in the last 168 hours. BNP (last 3 results) No results for input(s): PROBNP in the last 8760 hours. HbA1C: No results for input(s): HGBA1C in the last 72 hours. CBG: No results for input(s): GLUCAP in the last 168 hours. Lipid Profile: No results for input(s): CHOL, HDL, LDLCALC, TRIG, CHOLHDL, LDLDIRECT in the last 72 hours. Thyroid Function Tests: No results for input(s): TSH, T4TOTAL, FREET4, T3FREE, THYROIDAB in the last 72 hours. Anemia Panel: No results for input(s): VITAMINB12, FOLATE, FERRITIN, TIBC, IRON, RETICCTPCT in the last 72 hours. Urine analysis:    Component Value Date/Time   COLORURINE YELLOW 11/11/2020 0529   APPEARANCEUR CLOUDY (A) 11/11/2020 0529   LABSPEC 1.014 11/11/2020 0529   PHURINE 9.0 (H) 11/11/2020 0529   GLUCOSEU NEGATIVE 11/11/2020 0529   HGBUR NEGATIVE 11/11/2020 0529   BILIRUBINUR NEGATIVE 11/11/2020 0529   KETONESUR NEGATIVE 11/11/2020 0529   PROTEINUR 100 (A) 11/11/2020 0529   NITRITE POSITIVE (A) 11/11/2020 0529   LEUKOCYTESUR LARGE (A) 11/11/2020 0529   Sepsis Labs: _1 (procalcitonin:4,lacticidven:4) )No results found for this or any previous visit (from the past 240 hour(s)).   Radiological Exams on Admission: CT ABDOMEN PELVIS W CONTRAST  Result Date: 11/11/2020 CLINICAL DATA:  Nonlocalized abdominal pain EXAM: CT ABDOMEN AND PELVIS WITH CONTRAST TECHNIQUE: Multidetector CT imaging of the abdomen and  pelvis was performed using the standard protocol following bolus administration of intravenous contrast. CONTRAST:  165m OMNIPAQUE IOHEXOL 300 MG/ML  SOLN COMPARISON:  08/14/2020 FINDINGS: Lower chest:  No contributory findings. Hepatobiliary: No focal liver abnormality.Cholecystectomy. No bile duct dilatation. Pancreas: Unremarkable. Spleen: Unremarkable. Adrenals/Urinary Tract: Negative adrenals. Significant decrease in size of the left kidney with no enhancement of the upper and interpolar region. Right renal cysts. Normal cortical enhancement  on the right. No hydronephrosis. Distended bladder with dependent high-density material. A Foley catheter is in appropriate position. Stomach/Bowel: No obstruction. No appendicitis. Percutaneous gastrostomy tube in expected position Vascular/Lymphatic: No acute vascular abnormality. Mild atheromatous plaque. No mass or adenopathy. Reproductive:No pathologic findings. Other: No ascites or pneumoperitoneum. Musculoskeletal: No acute abnormalities. Lumbar spine degeneration with mild L4-5 anterolisthesis and L1-2 retrolisthesis. IMPRESSION: 1. Distended bladder despite a Foley catheter, please correlate with catheter function. 2. Layering debris or hemorrhage within the bladder lumen. 3. Significant volume loss of the left kidney since prior with primarily nonenhancing parenchyma. Prior findings were likely related to infarct rather than infection. Electronically Signed   By: Monte Fantasia M.D.   On: 11/11/2020 04:59   DG Chest Portable 1 View  Result Date: 11/11/2020 CLINICAL DATA:  Atrial fibrillation EXAM: PORTABLE CHEST 1 VIEW COMPARISON:  09/18/2020 FINDINGS: Lungs are clear. Mild cardiomegaly is stable. No pneumothorax or pleural effusion. Pulmonary vascularity is normal. No acute bone abnormality. IMPRESSION: No active disease.  Stable cardiomegaly. Electronically Signed   By: Fidela Salisbury MD   On: 11/11/2020 04:21    EKG: Independently reviewed. Atrial  fibrillation, rate 145.   Assessment/Plan   1. Atrial fibrillation with RVR  - Patient with atrial fibrillation presents with abdominal pain and is found to be in a fib RVR with rate 140s  - She has been given IV diltiazem bolus and started on diltiazem infusion  - CHADS-VASc is 6 (gender, CVA x2, age, HTN, DM)  - Continue Eliquis, continue metoprolol, titrate diltiazem infusion as needed   2. UTI, catheter-associated  - Patient with chronic Foley catheter presents with abdominal pain and is found to have Foley malfunction with bladder distension  - Foley replaced and draining, UA compatible with UTI  - She meets sepsis criteria with leukocytosis and tachycardia but is afebrile and tachycardia related to a fib, suggesting this may not be sepsis, will check lactate, culture urine, start empiric antibiotics, give 1 liter fluid bolus and consider additional IVF and sepsis workup if lactate elevated    3. History of CVA with dysphagia - Patient has hx of CVA with left-sided weakness, dysphagia, and aphasia  - Continue Eliquis and statin, aspiration precautions, tube feeds, supportive care   4. Insulin-dependent DM  - A1c was 7.2% in September 2021  - Continue CBG checks and insulin    DVT prophylaxis: Eliquis  Code Status: Full  Family Communication: Husband updated at bedside Disposition Plan:  Patient is from: SNF  Anticipated d/c is to: SNF  Anticipated d/c date is: 11/14/20 Patient currently: On IV diltiazem, pending sepsis workup  Consults called: None  Admission status: Inpatient     Vianne Bulls, MD [facility]  11/11/2020, 5:57 AM

## 2020-11-11 NOTE — ED Triage Notes (Signed)
Pt arrived via ems from [facility] due to abdominal pain. Per facility husband states pt has complaints of abdominal pain. Pt is non verbal from previous stroke.

## 2020-11-11 NOTE — Progress Notes (Signed)
Pt received on 4E.  Spouse at bedside assisting with communication.  CCMD notified. Pt and family oriented to room and call bell.  CHG wipes complete.

## 2020-11-11 NOTE — Progress Notes (Signed)
Pharmacy Antibiotic Note  Samantha Paul is a 74 y.o. female admitted on 11/11/2020 with UTI.  Pharmacy has been consulted for cefepime dosing.  Plan: Cefepime 2g IV Q24H.  Height: 5\' 2"  (157.5 cm) Weight: 54.9 kg (121 lb) IBW/kg (Calculated) : 50.1  Temp (24hrs), Avg:98.2 F (36.8 C), Min:98.2 F (36.8 C), Max:98.2 F (36.8 C)  Recent Labs  Lab 11/11/20 0338 11/11/20 0349  WBC 13.5*  --   CREATININE  --  0.70    Estimated Creatinine Clearance: 50.3 mL/min (by C-G formula based on SCr of 0.7 mg/dL).    Allergies  Allergen Reactions  . Metformin And Related Diarrhea    Abdominal pain and diarrhea    Thank you for allowing pharmacy to be a part of this patient's care.  11/13/20, PharmD, BCPS  11/11/2020 6:08 AM

## 2020-11-11 NOTE — Consult Note (Addendum)
Cardiology Consultation:   Patient ID: Madison Direnzo MRN: [id number]; DOB: March 13, 1946  Admit date: 11/11/2020 Date of Consult: 11/11/2020  Primary Care Provider: Elwyn Reach, MD [place] Cardiologist: No primary care provider on file.  [facility] Electrophysiologist:  None    Patient Profile:   Avrie Osgood is a 74 y.o. female with a hx of paroxysmal afib on Eliquis, insulin-dependent DM2, [place], history of CVA of aphasia, dysphagia, neurogenic bladder, motor deficits, HTN coming from SNF who is being seen today for the evaluation of Afib RVR at the request of Dr. Starla Link.  History of Present Illness:   Ms. Brede has a history of Afib on Eliquis. No history of heart attack or stents.  The patient presented to the ER 11/11/20 for abdominal pain. We spoke to the husband and granddaughter on the phone, who helped translate. The patient lives in a SNF and appeared to be in pain. Also she was not urinating so she was brought to the ER for evalution.   In the ED the patient was mildly tachypneic, tachycardic in the 140s. He was afebrile with stable pressures. EKG showed afib RVR. CXR unremarkable. CT abdomen/pelvis showed distended bladder with layering debris or hemorrhage within the urinary bladder and significant volume loss of the left kidney with likely infarction. Labs showed sodium 133, potassium 4.8, glucose 200, creatinine 0.70, Hs trop 11>13. Lactic acid 4.3, WBC 13., Hgb 11.6, UA positive for infection. The patient was given IV and started on infusion and admitted.     Past Medical History:  Diagnosis Date  . Afib ([place]) 06/2020   Diagnosed 06/2020, on cardizem, metoprolol, eliquis  . Back pain   . CVA (cerebral vascular accident) ([place]) 07/26/2020   R MCA/ACA CVA  . DM II (diabetes mellitus, type II), controlled ([place])   . HTN (hypertension)   . Knee pain   . Stroke [facility]) ?   residual mild right sided weakness     Past Surgical History:  Procedure Laterality Date  .  CHOLECYSTECTOMY N/A 06/30/2020   Procedure: LAPAROSCOPIC CHOLECYSTECTOMY;  Surgeon: Georganna Skeans, MD;  Location: [place];  Service: General;  Laterality: N/A;  . ESOPHAGOGASTRODUODENOSCOPY N/A 08/18/2020   Procedure: ESOPHAGOGASTRODUODENOSCOPY (EGD);  Surgeon: Georganna Skeans, MD;  Location: [place];  Service: Endoscopy;  Laterality: N/A;  . IR ANGIO VERTEBRAL SEL SUBCLAVIAN INNOMINATE UNI R MOD SED  07/29/2020  . IR PERCUTANEOUS ART THROMBECTOMY/INFUSION INTRACRANIAL INC DIAG ANGIO  07/26/2020  . PEG PLACEMENT N/A 08/18/2020   Procedure: PERCUTANEOUS ENDOSCOPIC GASTROSTOMY (PEG) PLACEMENT;  Surgeon: Georganna Skeans, MD;  Location: [place];  Service: Endoscopy;  Laterality: N/A;  . RADIOLOGY WITH ANESTHESIA N/A 07/26/2020   Procedure: IR WITH ANESTHESIA;  Surgeon: Radiologist, Medication, MD;  Location: [place];  Service: Radiology;  Laterality: N/A;     Home Medications:  Prior to Admission medications   Medication Sig Start Date End Date Taking? Authorizing Provider  acetaminophen (TYLENOL) 160 MG/5ML solution Place 20.3 mLs (650 mg total) into feeding tube every 4 (four) hours as needed for mild pain (or temp > 37.5 C (99.5 F)). 08/23/20  Yes Hosie Poisson, MD  acetaminophen (TYLENOL) 325 MG tablet Place 650 mg into feeding tube every 8 (eight) hours as needed for moderate pain.   Yes [provider]  apixaban (ELIQUIS) 5 MG TABS tablet Take 1 tablet (5 mg total) by mouth 2 (two) times daily. Patient taking differently: Place 5 mg into feeding tube 2 (two) times daily.  07/03/20  Yes Lama,  Marge Duncans, MD  bethanechol (URECHOLINE) 10 MG tablet Place 1 tablet (10 mg total) into feeding tube 3 (three) times daily. 08/23/20  Yes Hosie Poisson, MD  diclofenac Sodium (VOLTAREN) 1 % GEL Apply 2 g topically in the morning, at noon, and at bedtime.  11/03/20  Yes [provider]  diltiazem (CARDIZEM) 60 MG tablet Place 60 mg into feeding tube 4 (four) times daily.   Yes [provider]  ferrous sulfate 300 (60 Fe) MG/5ML syrup Place 5 mLs (300 mg total) into feeding tube 3 (three) times daily. 08/23/20  Yes Hosie Poisson, MD  gabapentin (NEURONTIN) 100 MG capsule 100 mg every 12 (twelve) hours. Peg tube 11/01/20  Yes [provider]  insulin aspart (NOVOLOG) 100 UNIT/ML injection CBG 70 - 120: 0 units CBG 121 - 150: 1 unit CBG 151 - 200: 2 units CBG 201 - 250: 3 units CBG 251 - 300: 5 units CBG 301 - 350: 7 units CBG 351 - 400: 9 units Patient taking differently: Inject 0-9 Units into the skin.  08/23/20  Yes Hosie Poisson, MD  LANTUS SOLOSTAR 100 UNIT/ML Solostar Pen Inject 22 Units into the skin daily.  10/28/20  Yes [provider]  metFORMIN (GLUCOPHAGE) 500 MG tablet Take 1 tablet (500 mg total) by mouth 2 (two) times daily with a meal. Patient taking differently: Place 500 mg into feeding tube 2 (two) times daily with a meal.  07/03/20 07/03/21 Yes Darrick Meigs, Marge Duncans, MD  methylphenidate (RITALIN) 5 MG tablet Place 5 mg into feeding tube 2 (two) times daily.    Yes [provider]  metoprolol tartrate (LOPRESSOR) 50 MG tablet Place 1 tablet (50 mg total) into feeding tube 2 (two) times daily. 08/23/20  Yes Hosie Poisson, MD  Nutritional Supplements (FEEDING SUPPLEMENT, JEVITY 1.2 CAL,) LIQD Place 1,000 mLs into feeding tube continuous. 08/23/20  Yes Hosie Poisson, MD  Pollen Extracts (PROSTAT PO) 1 packet by PEG Tube route in the morning and at bedtime.   Yes [provider]  rosuvastatin (CRESTOR) 20 MG tablet Place 1 tablet (20 mg total) into feeding tube daily. 08/24/20  Yes Hosie Poisson, MD  saccharomyces boulardii (FLORASTOR) 250 MG capsule Place 250 mg into feeding tube 2 (two) times daily.   Yes [provider]  senna (SENOKOT) 8.6 MG tablet Place 2 tablets into feeding tube daily.    Yes [provider]  traMADol (ULTRAM) 50 MG tablet Place 50 mg into feeding tube every 8 (eight) hours.  11/06/20  Yes [provider]  Water For Irrigation, Sterile (FREE WATER) SOLN Place 100 mLs into feeding tube every 8 (eight) hours. 08/23/20  Yes Hosie Poisson, MD  Zinc Oxide 16 % OINT Apply 1 application topically in the morning and at bedtime.   Yes [provider]  blood glucose meter kit and supplies KIT Dispense based on patient and insurance preference. Use up to four times daily as directed. (FOR ICD-10--E11.9). 07/25/18   Love, Ivan Anchors, PA-C  insulin glargine (LANTUS) 100 UNIT/ML injection Inject 0.22 mLs (22 Units total) into the skin daily. Patient not taking: Reported on 11/11/2020 09/23/20   Terrilee Croak, MD  Insulin Pen Needle (PEN NEEDLES) 32G X 4 MM MISC Use as directed with insulin pen 07/03/20   Oswald Hillock, MD    Inpatient Medications: Scheduled Meds: . apixaban  5 mg Per Tube BID  . bethanechol  10 mg Per Tube TID  . diclofenac Sodium  2 g  Topical TID  . free water  100 mL Per Tube Q8H  . insulin aspart  0-9 Units Subcutaneous Q4H  . insulin glargine  20 Units Subcutaneous QHS  . methylphenidate  5 mg Per Tube BID  . metoprolol tartrate  50 mg Per Tube BID  . rosuvastatin  20 mg Per Tube Daily  . saccharomyces boulardii  250 mg Per Tube BID  . senna  2 tablet Per Tube Daily  . zinc oxide   Topical BID   Continuous Infusions: . sodium chloride 125 mL/hr at 11/11/20 1103  . [START ON 11/12/2020] ceFEPime (MAXIPIME) IV    . diltiazem (CARDIZEM) infusion Stopped (11/11/20 1342)  . feeding supplement (JEVITY 1.2 CAL) Stopped (11/11/20 1204)   PRN Meds: acetaminophen **OR** acetaminophen, morphine injection, ondansetron **OR** ondansetron (ZOFRAN) IV, traMADol  Allergies:    Allergies  Allergen Reactions  . Metformin And Related Diarrhea    Abdominal pain and diarrhea    Social History:   Social History   Socioeconomic History  . Marital status: Married    Spouse name: Not on file  . Number of children: Not on file  . Years of education: Not on file  . Highest  education level: Not on file  Occupational History  . Not on file  Tobacco Use  . Smoking status: Never Smoker  . Smokeless tobacco: Never Used  Substance and Sexual Activity  . Alcohol use: Not Currently  . Drug use: Not Currently  . Sexual activity: Not on file  Other Topics Concern  . Not on file  Social History Narrative  . Not on file   Social Determinants of Health   Financial Resource Strain:   . Difficulty of Paying Living Expenses: Not on file  Food Insecurity:   . Worried About Charity fundraiser in the Last Year: Not on file  . Ran Out of Food in the Last Year: Not on file  Transportation Needs:   . Lack of Transportation (Medical): Not on file  . Lack of Transportation (Non-Medical): Not on file  Physical Activity:   . Days of Exercise per Week: Not on file  . Minutes of Exercise per Session: Not on file  Stress:   . Feeling of Stress : Not on file  Social Connections:   . Frequency of Communication with Friends and Family: Not on file  . Frequency of Social Gatherings with Friends and Family: Not on file  . Attends Religious Services: Not on file  . Active Member of Clubs or Organizations: Not on file  . Attends Archivist Meetings: Not on file  . Marital Status: Not on file  Intimate Partner Violence:   . Fear of Current or Ex-Partner: Not on file  . Emotionally Abused: Not on file  . Physically Abused: Not on file  . Sexually Abused: Not on file    Family History:   Family History  Problem Relation Age of Onset  . Arthritis Brother      ROS:  Please see the history of present illness.  All other ROS reviewed and negative.     Physical Exam/Data:   Vitals:   11/11/20 1200 11/11/20 1215 11/11/20 1300 11/11/20 1315  BP: 115/78 102/86 123/80 123/78  Pulse: (!) 110 85 (!) 43 (!) 128  Resp: 18 (!) 23 20 19   Temp:      TempSrc:      SpO2: 100% 100% 100% 100%  Weight:      Height:  Intake/Output Summary (Last 24 hours) at  11/11/2020 1429 Last data filed at 11/11/2020 1342 Gross per 24 hour  Intake 1144.25 ml  Output 600 ml  Net 544.25 ml   Last 3 Weights 11/11/2020 09/18/2020 08/23/2020  Weight (lbs) 121 lb 120 lb 13 oz 141 lb 15.6 oz  Weight (kg) 54.885 kg 54.8 kg 64.4 kg     Body mass index is 22.13 kg/m.  General:  Well nourished, well developed, in no acute distress HEENT: normal Lymph: no adenopathy Neck: no JVD Endocrine:  No thryomegaly Vascular: No carotid bruits; FA pulses 2+ bilaterally without bruits  Cardiac:  normal S1, S2; Irreg Irreg; no murmur  Lungs:  clear to auscultation bilaterally, no wheezing, rhonchi or rales  Abd: soft, nontender, no hepatomegaly  Ext: no edema Musculoskeletal:  No deformities, BUE and BLE strength normal and equal Skin: warm and dry  Neuro:  CNs 2-12 intact, no focal abnormalities noted Psych:  Normal affect   EKG:  The EKG was personally reviewed and demonstrates:  Afib, 145bpm, LVH with repol abnormality Telemetry:  Telemetry was personally reviewed and demonstrates: Afib, HR improving, 140s>110  Relevant CV Studies:  Echo 07/03/20 1. Left ventricular ejection fraction, by estimation, is 65 to 70%. The  left ventricle has normal function. Narrow LVOT (60m at smallest  diameter) Turbulent flow through LV /LVOT with no significant obstruction  at rest. The left ventricle has no  regional wall motion abnormalities. There is mild left ventricular  hypertrophy. Left ventricular diastolic parameters are indeterminate.  2. Right ventricular systolic function is normal. The right ventricular  size is normal. There is normal pulmonary artery systolic pressure.  3. The mitral valve mildly thickened with annular calcification. Mild  mitral valve regurgitation.  4. The aortic valve is tricuspid. Aortic valve regurgitation is not  visualized. Mild aortic valve stenosis.  5. The inferior vena cava is normal in size with greater than 50%  respiratory  variability, suggesting right atrial pressure of 3 mmHg.   Laboratory Data:  High Sensitivity Troponin:   Recent Labs  Lab 11/11/20 0414 11/11/20 0620  TROPONINIHS 11 13     Chemistry Recent Labs  Lab 11/11/20 0349  NA 133*  K 4.8  CL 102  GLUCOSE 200*  BUN 41*  CREATININE 0.70    No results for input(s): PROT, ALBUMIN, AST, ALT, ALKPHOS, BILITOT in the last 168 hours. Hematology Recent Labs  Lab 11/11/20 0338 11/11/20 0349  WBC 13.5*  --   RBC 4.73  --   HGB 11.6* 13.3  HCT 39.7 39.0  MCV 83.9  --   MCH 24.5*  --   MCHC 29.2*  --   RDW 14.6  --   PLT 269  --    BNPNo results for input(s): BNP, PROBNP in the last 168 hours.  DDimer No results for input(s): DDIMER in the last 168 hours.   Radiology/Studies:  CT ABDOMEN PELVIS W CONTRAST  Result Date: 11/11/2020 CLINICAL DATA:  Nonlocalized abdominal pain EXAM: CT ABDOMEN AND PELVIS WITH CONTRAST TECHNIQUE: Multidetector CT imaging of the abdomen and pelvis was performed using the standard protocol following bolus administration of intravenous contrast. CONTRAST:  103mOMNIPAQUE IOHEXOL 300 MG/ML  SOLN COMPARISON:  08/14/2020 FINDINGS: Lower chest:  No contributory findings. Hepatobiliary: No focal liver abnormality.Cholecystectomy. No bile duct dilatation. Pancreas: Unremarkable. Spleen: Unremarkable. Adrenals/Urinary Tract: Negative adrenals. Significant decrease in size of the left kidney with no enhancement of the upper and interpolar region. Right renal cysts. Normal cortical  enhancement on the right. No hydronephrosis. Distended bladder with dependent high-density material. A Foley catheter is in appropriate position. Stomach/Bowel: No obstruction. No appendicitis. Percutaneous gastrostomy tube in expected position Vascular/Lymphatic: No acute vascular abnormality. Mild atheromatous plaque. No mass or adenopathy. Reproductive:No pathologic findings. Other: No ascites or pneumoperitoneum. Musculoskeletal: No acute  abnormalities. Lumbar spine degeneration with mild L4-5 anterolisthesis and L1-2 retrolisthesis. IMPRESSION: 1. Distended bladder despite a Foley catheter, please correlate with catheter function. 2. Layering debris or hemorrhage within the bladder lumen. 3. Significant volume loss of the left kidney since prior with primarily nonenhancing parenchyma. Prior findings were likely related to infarct rather than infection. Electronically Signed   By: Monte Fantasia M.D.   On: 11/11/2020 04:59   DG Chest Portable 1 View  Result Date: 11/11/2020 CLINICAL DATA:  Atrial fibrillation EXAM: PORTABLE CHEST 1 VIEW COMPARISON:  09/18/2020 FINDINGS: Lungs are clear. Mild cardiomegaly is stable. No pneumothorax or pleural effusion. Pulmonary vascularity is normal. No acute bone abnormality. IMPRESSION: No active disease.  Stable cardiomegaly. Electronically Signed   By: Fidela Salisbury MD   On: 11/11/2020 04:21     Assessment and Plan:   UTI/Sepsis - presented with abdominal pain and bladder distention - UA with UTI - lactate 4.3 - IVF - abx per IM  Afib with RVR - history of paroxysmal afib now in RVR in the setting of UTI - rates up to 140s started on IV dilt - CHADSVASC at least 6 (gender, CVA, age, HTN, DM2) - continue Eliquis - Metoprolol - Echo LVEF 65-70%, turbulent flow through Lv/LVOT with no obstruction ar rest, mild LVH, mild MR, mild AS - HR improving with IV dilt  HTN - metoprolol 50 mg BID - IV dilt - pressures okay  H/o of CVA with dysphagia, aphasia - continue Eliquis, statin  DM2 - SSI  For questions or updates, please contact [facility] Please consult [email or web address] for contact info under    Signed, Cadence Arlyss Repress  11/11/2020 2:29 PM   Patient was seen and examined and agree with Cadence Kathlen Mody, PA-C.  History very limited due to language barrier and patient unfortunately is unable to communicate due to recent stroke. We called the family and had the  granddaughter translate with aide of the patient's husband.  In brief, the patient is a 74 year old female with a hx of paroxysmal afib on Eliquis, insulin-dependent DM2, history of CVA with residual aphasia, dysphagia, neurogenic bladder, and motor deficits, and HTN who presented from SNF with abdominal pain found to have UTI. Course has been complicated by Afib with RVR for which cardiology has been consulted.  Exam: GEN: Laying in bed, appears uncomfortable Neck: No JVD Cardiac: Irregularly irregular, no murmur Respiratory: Clear to auscultation bilaterally. GI: Soft, grimacing on exam MS: No edema; No deformity. Neuro:  Nonfocal  Psych: Normal affect   Plan: -Continue dilt gtt for now-->can transition to PO tomorrow  -Continue metop PO for rate control -Continue apixaban for anticoagulation -TTE with normal BiV function mild LVH, mild MR and AS -Management of UTI/sepsis per primary team  Gwyndolyn Kaufman, MD

## 2020-11-12 DIAGNOSIS — Z515 Encounter for palliative care: Secondary | ICD-10-CM

## 2020-11-12 DIAGNOSIS — Z7189 Other specified counseling: Secondary | ICD-10-CM

## 2020-11-12 DIAGNOSIS — I480 Paroxysmal atrial fibrillation: Secondary | ICD-10-CM

## 2020-11-12 LAB — CBC
HCT: 34.1 % — ABNORMAL LOW (ref 36.0–46.0)
Hemoglobin: 10.3 g/dL — ABNORMAL LOW (ref 12.0–15.0)
MCH: 24.5 pg — ABNORMAL LOW (ref 26.0–34.0)
MCHC: 30.2 g/dL (ref 30.0–36.0)
MCV: 81.2 fL (ref 80.0–100.0)
Platelets: 166 10*3/uL (ref 150–400)
RBC: 4.2 MIL/uL (ref 3.87–5.11)
RDW: 14.8 % (ref 11.5–15.5)
WBC: 5.2 10*3/uL (ref 4.0–10.5)
nRBC: 0 % (ref 0.0–0.2)

## 2020-11-12 LAB — BASIC METABOLIC PANEL
Anion gap: 9 (ref 5–15)
BUN: 26 mg/dL — ABNORMAL HIGH (ref 8–23)
CO2: 23 mmol/L (ref 22–32)
Calcium: 8.9 mg/dL (ref 8.9–10.3)
Chloride: 102 mmol/L (ref 98–111)
Creatinine, Ser: 0.79 mg/dL (ref 0.44–1.00)
GFR, Estimated: >60 mL/min (ref >60)
Glucose, Bld: 137 mg/dL — ABNORMAL HIGH (ref 70–99)
Potassium: 4 mmol/L (ref 3.5–5.1)
Sodium: 134 mmol/L — ABNORMAL LOW (ref 135–145)

## 2020-11-12 LAB — GLUCOSE, CAPILLARY
Glucose-Capillary: 114 mg/dL — ABNORMAL HIGH (ref 70–99)
Glucose-Capillary: 141 mg/dL — ABNORMAL HIGH (ref 70–99)
Glucose-Capillary: 144 mg/dL — ABNORMAL HIGH (ref 70–99)
Glucose-Capillary: 173 mg/dL — ABNORMAL HIGH (ref 70–99)
Glucose-Capillary: 183 mg/dL — ABNORMAL HIGH (ref 70–99)
Glucose-Capillary: 186 mg/dL — ABNORMAL HIGH (ref 70–99)

## 2020-11-12 LAB — MRSA PCR SCREENING: MRSA by PCR: NEGATIVE

## 2020-11-12 LAB — MAGNESIUM: Magnesium: 2 mg/dL (ref 1.7–2.4)

## 2020-11-12 MED ORDER — DILTIAZEM 12 MG/ML ORAL SUSPENSION
90.0000 mg | Freq: Three times a day (TID) | ORAL | Status: DC
  Administered 2020-11-12 – 2020-11-18 (×19): 90 mg
  Filled 2020-11-12 (×21): qty 9

## 2020-11-12 MED ORDER — DILTIAZEM HCL ER COATED BEADS 180 MG PO CP24
300.0000 mg | ORAL_CAPSULE | Freq: Every day | ORAL | Status: DC
  Filled 2020-11-12 (×2): qty 1

## 2020-11-12 MED ORDER — JEVITY 1.2 CAL PO LIQD
1000.0000 mL | ORAL | Status: DC
  Administered 2020-11-12: 1000 mL
  Filled 2020-11-12 (×4): qty 1000

## 2020-11-12 MED ORDER — ACETAMINOPHEN 325 MG PO TABS
650.0000 mg | ORAL_TABLET | Freq: Four times a day (QID) | ORAL | Status: DC
  Administered 2020-11-12 – 2020-11-18 (×21): 650 mg
  Filled 2020-11-12 (×21): qty 2

## 2020-11-12 MED ORDER — PROSOURCE TF PO LIQD
45.0000 mL | Freq: Every day | ORAL | Status: DC
  Administered 2020-11-12 – 2020-11-16 (×5): 45 mL
  Filled 2020-11-12 (×7): qty 45

## 2020-11-12 MED ORDER — ACETAMINOPHEN 650 MG RE SUPP
650.0000 mg | Freq: Four times a day (QID) | RECTAL | Status: DC
  Filled 2020-11-12: qty 1

## 2020-11-12 NOTE — Progress Notes (Signed)
Daily Progress Note   Patient Name: Samantha Paul       Date: 11/12/2020 DOB: [birth year]  Age: 74 y.o. MRN#: [id number] Attending Physician: Aline August, MD Primary Care Physician: Elwyn Reach, MD Admit Date: 11/11/2020  Reason for Consultation/Follow-up: Establishing goals of care   Subjective: I met with husband at bedside to discuss diagnosis, prognosis, GOC, EOL wishes, disposition, and options.  We spoke via interpreter Leodis Liverpool) cell [phone], interpreter services [phone]  I introduced Palliative Medicine as specialized medical care for people living with serious illness. It focuses on providing relief from the symptoms and stress of a serious illness.   We discussed her current illness and what it means in the larger context of her ongoing co-morbidities.  We discussed the natural disease trajectory of a large stroke with significant residual deficits. These deficits include inability to move her left side, swallow, and communicate. I explained that these deficits will not likely improve over time. We also discussed that urinary infections will be a recurrent issue from needing a chronic Foley catheter.   We discussed the issue of quality of life. Emphasized the fact that patient was very active prior to the stroke. I asked husband if patient would be ok with her current condition. He states that he is sometimes able to understand his wife and that she has indicated to him that she is frustrated. He seems to agree that she does not have good quality of life in her current state.   The difference between aggressive medical intervention and comfort care was considered in light of the patient's goals of care.  I introduced the concept of a comfort path to husband and encouraged  him to think about at what point he would want to stop aggressive medical interventions and focus on comfort rather than cure/prolonging life.   Husband expresses that this is "bad news" and that he is sad. This has been a very difficult situation for the family. He requests time to speak with his sons and then for [place] to meet again as a group.   Questions and concerns were addressed. Husband was given PMT contact info and encouraged to call with questions or concerns.    Length of Stay: 1  Current Medications: Scheduled Meds:  . acetaminophen  650 mg Per Tube Q6H   Or  .  acetaminophen  650 mg Rectal Q6H  . apixaban  5 mg Per Tube BID  . bethanechol  10 mg Per Tube TID  . Chlorhexidine Gluconate Cloth  6 each Topical Daily  . diclofenac Sodium  2 g Topical TID  . diltiazem  90 mg Per Tube Q8H  . feeding supplement (PROSource TF)  45 mL Per Tube Daily  . free water  100 mL Per Tube Q8H  . insulin aspart  0-9 Units Subcutaneous Q4H  . insulin glargine  20 Units Subcutaneous QHS  . methylphenidate  5 mg Per Tube BID  . metoprolol tartrate  50 mg Per Tube BID  . rosuvastatin  20 mg Per Tube Daily  . saccharomyces boulardii  250 mg Per Tube BID  . senna  2 tablet Per Tube Daily  . zinc oxide   Topical BID    Continuous Infusions: . sodium chloride 75 mL/hr at 11/12/20 0743  . ceFEPime (MAXIPIME) IV 2 g (11/12/20 0612)  . feeding supplement (JEVITY 1.2 CAL) 1,000 mL (11/12/20 1509)    PRN Meds: morphine injection, ondansetron **OR** ondansetron (ZOFRAN) IV, traMADol  Physical Exam Vitals reviewed.  Constitutional:      General: She is not in acute distress.    Comments: Appears chronically ill  Cardiovascular:     Rate and Rhythm: Normal rate.     Comments: A-fib on monitor Pulmonary:     Effort: Pulmonary effort is normal.  Neurological:     Mental Status: She is alert.     Comments: Following commands (she is able to squeeze my hand and move her toes on the right side)  when instructed by the interpreter             Vital Signs: BP 117/73   Pulse 64   Temp 98.5 F (36.9 C)   Resp 20   Ht 5' 2"  (1.575 m)   Wt 57.8 kg   SpO2 100%   BMI 23.31 kg/m  SpO2: SpO2: 100 % O2 Device: O2 Device: Room Air O2 Flow Rate:    Intake/output summary:   Intake/Output Summary (Last 24 hours) at 11/12/2020 1835 Last data filed at 11/12/2020 1509 Gross per 24 hour  Intake 4680.63 ml  Output 300 ml  Net 4380.63 ml   LBM: Last BM Date:  (unknown by pt) Baseline Weight: Weight: 54.9 kg Most recent weight: Weight: 57.8 kg       Palliative Assessment/Data: PPS 20%     Patient Active Problem List   Diagnosis Date Noted  . Atrial fibrillation with RVR ([place]) 11/11/2020  . Acute lower UTI 11/11/2020  . Paroxysmal atrial fibrillation (HCC)   . Urinary retention   . Dysphagia due to recent cerebrovascular accident   . Acute pyelonephritis 08/17/2020  . C. difficile colitis 08/16/2020  . Palliative care by specialist   . DNR (do not resuscitate) discussion   . Acute respiratory failure with hypoxia ([place])   . Stroke ([place]) 07/26/2020  . Stroke due to embolism ([place]) 07/26/2020  . Middle cerebral artery embolism, right 07/26/2020  . Sepsis ([place]) 06/29/2020  . DKA (diabetic ketoacidoses) 06/29/2020  . ARF (acute renal failure) ([place]) 06/29/2020  . Hemiparesis affecting right side as late effect of stroke ([place])   . Acute blood loss anemia   . Left pontine stroke ([place]) 07/13/2018  . Essential hypertension   . Diabetes mellitus type 2 in nonobese (HCC)   . CVA (cerebral vascular accident) ([place]) 07/10/2018  . Hypertensive urgency 07/10/2018  .  Hyperglycemia 07/10/2018  . Weakness generalized 07/10/2018  . Hyperlipidemia     Palliative Care Assessment & Plan   HPI/Patient Profile: 74 y.o. female  with past medical history of atrial fibrillation on Eliquis, insulin-dependent diabetes mellitus, hypertension, and recent CVA. She presented to the emergency  department from SNF on 11/11/2020 with complaint of abdominal pain.  ED Course: EKG shows a-fib with rapid rate (140's) - patient started on a diltiazem infusion. Chest x-ray negative for acute findings. CT abdomen/pelvis shows distended bladder with debris or hemorrhage within the bladder and significant volume loss of the left kidney likely reflecting prior infarction. Foley catheter was changed and is now draining, urinalysis concerning for infection.   Assessment: - septic shock secondary to UTI - paroxysmal A fib with RVR - history of CVA - dysphagia, dependent on PEG tube feeding - aphasia - left sided hemiplegia - anemia of chronic disease  Recommendations/Plan: - full code full scope treatment with ongoing discussions - provided education that patient's residual deficits will not  Improve and that urinary and other infections will be a recurrent issue - husband seems to agree that patient does not have good quality of life in her current condition - plan to meet again Saturday 11/20 (time TBD)  Goals of Care and Additional Recommendations:  Limitations on Scope of Treatment: Full Scope Treatment  Code Status: Full code with ongoing discussions  Prognosis:   Poor  Discharge Planning:  To Be Determined   Thank you for allowing the Palliative Medicine Team to assist in the care of this patient.   Total Time 67 minutes Prolonged Time Billed  yes      Greater than 50%  of this time was spent counseling and coordinating care related to the above assessment and plan.  Lavena Bullion, NP  Please contact Palliative Medicine Team phone at [phone] for questions and concerns.

## 2020-11-12 NOTE — Progress Notes (Addendum)
Progress Note  Patient Name: Samantha Paul Date of Encounter: 11/12/2020  [facility] Cardiologist: No primary care provider on file.   Subjective   Afib rates better today, in the 60-70s.   Inpatient Medications    Scheduled Meds: . apixaban  5 mg Per Tube BID  . bethanechol  10 mg Per Tube TID  . Chlorhexidine Gluconate Cloth  6 each Topical Daily  . diclofenac Sodium  2 g Topical TID  . free water  100 mL Per Tube Q8H  . insulin aspart  0-9 Units Subcutaneous Q4H  . insulin glargine  20 Units Subcutaneous QHS  . methylphenidate  5 mg Per Tube BID  . metoprolol tartrate  50 mg Per Tube BID  . rosuvastatin  20 mg Per Tube Daily  . saccharomyces boulardii  250 mg Per Tube BID  . senna  2 tablet Per Tube Daily  . zinc oxide   Topical BID   Continuous Infusions: . sodium chloride 75 mL/hr at 11/12/20 0743  . ceFEPime (MAXIPIME) IV 2 g (11/12/20 0612)  . diltiazem (CARDIZEM) infusion 12.5 mg/hr (11/12/20 0200)  . feeding supplement (JEVITY 1.2 CAL) 1,000 mL (11/11/20 2152)   PRN Meds: acetaminophen **OR** acetaminophen, morphine injection, ondansetron **OR** ondansetron (ZOFRAN) IV, traMADol   Vital Signs    Vitals:   11/12/20 0137 11/12/20 0345 11/12/20 0500 11/12/20 0800  BP: 108/69 129/85    Pulse: 61 79 67 71  Resp: (!) 22 20 20 19   Temp: 98.1 F (36.7 C) 97.9 F (36.6 C)    TempSrc: Oral Oral    SpO2: 100% 100% 100% 100%  Weight:   57.8 kg   Height:        Intake/Output Summary (Last 24 hours) at 11/12/2020 0855 Last data filed at 11/12/2020 0451 Gross per 24 hour  Intake 3919.04 ml  Output 1000 ml  Net 2919.04 ml   Last 3 Weights 11/12/2020 11/11/2020 11/11/2020  Weight (lbs) 127 lb 6.8 oz 127 lb 3.3 oz 121 lb  Weight (kg) 57.8 kg 57.7 kg 54.885 kg      Telemetry    Afib, HR 60-70s - Personally Reviewed  ECG    No new - Personally Reviewed  Physical Exam   GEN: No acute distress.   Neck: No JVD Cardiac: Irreg Irreg, no murmurs, rubs,  or gallops.  Respiratory: Clear to auscultation bilaterally. GI: Soft, nontender, non-distended  MS: No edema; No deformity. Neuro:  Left arm flaccid. Aphasic.  Psych: Normal affect   Labs    High Sensitivity Troponin:   Recent Labs  Lab 11/11/20 0414 11/11/20 0620  TROPONINIHS 11 13      Chemistry Recent Labs  Lab 11/11/20 0349 11/11/20 1926 11/12/20 0615  NA 133* 133* 134*  K 4.8 4.8 4.0  CL 102 100 102  CO2  --  24 23  GLUCOSE 200* 105* 137*  BUN 41* 33* 26*  CREATININE 0.70 0.74 0.79  CALCIUM  --  9.1 8.9  PROT  --  6.3*  --   ALBUMIN  --  2.6*  --   AST  --  33  --   ALT  --  22  --   ALKPHOS  --  56  --   BILITOT  --  0.9  --   GFRNONAA  --  >60 >60  ANIONGAP  --  9 9     Hematology Recent Labs  Lab 11/11/20 0338 11/11/20 0349 11/12/20 0615  WBC 13.5*  --  5.2  RBC 4.73  --  4.20  HGB 11.6* 13.3 10.3*  HCT 39.7 39.0 34.1*  MCV 83.9  --  81.2  MCH 24.5*  --  24.5*  MCHC 29.2*  --  30.2  RDW 14.6  --  14.8  PLT 269  --  166    BNPNo results for input(s): BNP, PROBNP in the last 168 hours.   DDimer No results for input(s): DDIMER in the last 168 hours.   Radiology    CT ABDOMEN PELVIS W CONTRAST  Result Date: 11/11/2020 CLINICAL DATA:  Nonlocalized abdominal pain EXAM: CT ABDOMEN AND PELVIS WITH CONTRAST TECHNIQUE: Multidetector CT imaging of the abdomen and pelvis was performed using the standard protocol following bolus administration of intravenous contrast. CONTRAST:  OMNIPAQUE IOHEXOL 300 MG/ML  SOLN COMPARISON:  08/14/2020 FINDINGS: Lower chest:  No contributory findings. Hepatobiliary: No focal liver abnormality.Cholecystectomy. No bile duct dilatation. Pancreas: Unremarkable. Spleen: Unremarkable. Adrenals/Urinary Tract: Negative adrenals. Significant decrease in size of the left kidney with no enhancement of the upper and interpolar region. Right renal cysts. Normal cortical enhancement on the right. No hydronephrosis. Distended  bladder with dependent high-density material. A Foley catheter is in appropriate position. Stomach/Bowel: No obstruction. No appendicitis. Percutaneous gastrostomy tube in expected position Vascular/Lymphatic: No acute vascular abnormality. Mild atheromatous plaque. No mass or adenopathy. Reproductive:No pathologic findings. Other: No ascites or pneumoperitoneum. Musculoskeletal: No acute abnormalities. Lumbar spine degeneration with mild L4-5 anterolisthesis and L1-2 retrolisthesis. IMPRESSION: 1. Distended bladder despite a Foley catheter, please correlate with catheter function. 2. Layering debris or hemorrhage within the bladder lumen. 3. Significant volume loss of the left kidney since prior with primarily nonenhancing parenchyma. Prior findings were likely related to infarct rather than infection. Electronically Signed   By: Marnee Spring M.D.   On: 11/11/2020 04:59   DG Chest Portable 1 View  Result Date: 11/11/2020 CLINICAL DATA:  Atrial fibrillation EXAM: PORTABLE CHEST 1 VIEW COMPARISON:  09/18/2020 FINDINGS: Lungs are clear. Mild cardiomegaly is stable. No pneumothorax or pleural effusion. Pulmonary vascularity is normal. No acute bone abnormality. IMPRESSION: No active disease.  Stable cardiomegaly. Electronically Signed   By: Helyn Numbers MD   On: 11/11/2020 04:21    Cardiac Studies   Echo 07/03/20 1. Left ventricular ejection fraction, by estimation, is 65 to 70%. The  left ventricle has normal function. Narrow LVOT (74mm at smallest  diameter) Turbulent flow through LV /LVOT with no significant obstruction  at rest. The left ventricle has no  regional wall motion abnormalities. There is mild left ventricular  hypertrophy. Left ventricular diastolic parameters are indeterminate.  2. Right ventricular systolic function is normal. The right ventricular  size is normal. There is normal pulmonary artery systolic pressure.  3. The mitral valve mildly thickened with annular  calcification. Mild  mitral valve regurgitation.  4. The aortic valve is tricuspid. Aortic valve regurgitation is not  visualized. Mild aortic valve stenosis.  5. The inferior vena cava is normal in size with greater than 50%  respiratory variability, suggesting right atrial pressure of 3 mmHg.   Patient Profile     74 y.o. female with a hx of paroxysmal afib on Eliquis, insulin-dependent DM2, TN, history of CVA of aphasia, dysphagia, neurogenic bladder, motor deficits, HTN coming from SNF who is being seen today for the evaluation of Afib RVR.  Assessment & Plan    UTI/Sepsis - presented with abdominal pain and bladder distention - UA with UTI - lactate 4.3 - IVF -  abx per IM  Afib with RVR - history of paroxysmal afib now in RVR in the setting of UTI - rates up to 140s started on IV dilt - CHADSVASC at least 6 (gender, CVA, age, HTN, DM2) - continue Eliquis - continue Metoprolol - Recent Echo showed LVEF 65-70%, turbulent flow through Lv/LVOT with no obstruction ar rest, mild LVH, mild MR, mild AS - HR improving with IV dilt>>transition to PO dilt  HTN - metoprolol 50 mg BID -  dilt as above - pressures okay  H/o of CVA with dysphagia, aphasia - continue Eliquis, statin  DM2 - SSI  For questions or updates, please contact [facility] Please consult [email or web address] for contact info under        Signed, Cadence David Stall, PA-C  11/12/2020, 8:55 AM    Patient seen and examined and agree with Cadence Fransico Michael, PA-C.  Patient appears very uncomfortable and grimacing on exam. Rates much better controlled on dilt gtt. Now transitioning to PO regimen.  Exam: GEN: Grimacing on exam. Appears uncomfortable. Aphasic Neck: No JVD Cardiac: Irregularly irregular, no murmurs Respiratory: Clear to auscultation bilaterally. GI: Soft, non-distended MS: No edema; No deformity. Warm Neuro:  Left arm flaccid, aphasic Psych: Normal affect   Plan: -Transition to dilt 300mg   daily for rate control -Continue home metoprolol 50mg  BID -Continue apixaban for anticoagulation -Will schedule tylenol as appears to be in a lot of pain -Management of urosepsis per primary team  Cardiology will sign-off. Please call with questions or concerns.   , MD

## 2020-11-12 NOTE — Progress Notes (Signed)
Patient ID: Samantha Paul, female   DOB: [birth year], 74 y.o.   MRN: [id number]  PROGRESS NOTE    Samantha Paul  WUJ:[id number] DOB: [birth year] DOA: 11/11/2020 PCP: Rometta EmeryGarba, Mohammad L, MD   Brief Narrative:  74 y.o. female with medical history significant for atrial fibrillation on Eliquis, insulin-dependent diabetes mellitus, hypertension, and history of unspecified CVA with aphasia, dysphagia status post PEG tube placement, neurogenic bladder status post indwelling catheter, and motor deficits presented from SNF for evaluation of abdominal pain.  On presentation, she was slightly tachypneic, tachycardic in the 140s with stable blood pressure.  Chest x-ray showed no acute infiltrates.  CT of the abdomen and pelvis showed distended bladder with layering debris or hemorrhage within the urinary bladder and significant volume loss of the left kidney likely reflecting a prior infarction.  Foley catheter was changed.  UA was concerning for UTI.  WBCs of 13,500.  She was started on IV fluids, antibiotics and also started on Cardizem drip.  Subsequently cardiology was consulted.  Palliative care was also consulted for goals of care discussion.  Assessment & Plan:   Septic shock: Present on admission probably secondary to UTI UTI, catheter associated -Patient presented with abdominal pain and was found to be tachypneic, tachycardic with leukocytosis, evidence of UTI and initial lactic acid of 4.3. -Continue cefepime.  Follow cultures.  Decrease normal saline to 75 cc an hour.  Blood pressure currently stable. -Patient has indwelling Foley catheter which was changed in the ED.  Outpatient follow-up with urology  Paroxysmal A. fib with RVR -Cardiology following.  Currently on Cardizem drip.  Rate improving.  Continue metoprolol and Eliquis.  Leukocytosis -resolved  History of unspecified CVA with residual motor deficits, aphasia, dysphagia status post PEG tube placement -Patient has history of CVA with left-sided  weakness, dysphagia and aphasia and is dependent on PEG feedings.  Continue tube feedings and free water through PEG tube.  Continue Eliquis and statin. -Overall prognosis is poor.  Palliative care has been reconsulted for continuation of goals of care discussion -Fall precautions  Mild hyponatremia -Continue IV fluids as above.  Probable anemia of chronic disease -From recent chronic illnesses.  Hemoglobin stable.  Diabetes mellitus type 2 with hyperglycemia -A1c 7.2 in September 2021.  Continue CBGs with SSI.  Continue Lantus   DVT prophylaxis: Eliquis Code Status: Full Family Communication: None at bedside Disposition Plan: Status is: Inpatient  Remains inpatient appropriate because:Inpatient level of care appropriate due to severity of illness   Dispo: The patient is from: SNF              Anticipated d/c is to: SNF              Anticipated d/c date is: 2 days              Patient currently is not medically stable to d/c.   Consultants: Palliative care  Procedures: None  Antimicrobials:  Cefepime from 11/10/2020 onwards  Subjective: Please seen and examined at bedside.  Awake, does not answer any questions.  Does not follow commands, poor historian.  No overnight fever or vomiting reported.  Objective: Vitals:   11/12/20 0137 11/12/20 0345 11/12/20 0500 11/12/20 0800  BP: 108/69 129/85    Pulse: 61 79 67 71  Resp: (!) 22 20 20 19   Temp: 98.1 F (36.7 C) 97.9 F (36.6 C)    TempSrc: Oral Oral    SpO2: 100% 100% 100% 100%  Weight:   57.8 kg   Height:  Intake/Output Summary (Last 24 hours) at 11/12/2020 1036 Last data filed at 11/12/2020 0451 Gross per 24 hour  Intake 2919.04 ml  Output 1000 ml  Net 1919.04 ml   Filed Weights   11/11/20 0600 11/11/20 1909 11/12/20 0500  Weight: 54.9 kg 57.7 kg 57.8 kg    Examination:  General exam: Appears calm and comfortable.  Looks chronically ill and very deconditioned. Respiratory system: Bilateral  decreased breath sounds at bases with scattered crackles Cardiovascular system: S1 & S2 heard, currently rate controlled Gastrointestinal system: Abdomen is nondistended, soft and nontender. Normal bowel sounds heard. Extremities: No cyanosis, clubbing; trace lower extremity edema Central nervous system: Awake, does not answer any questions.  Does not follow commands, poor historian.  Aphasic.  Left arm flaccid. Skin: No obvious ecchymosis/lesions Psychiatry: Could not be assessed because of mental status  Data Reviewed: I have personally reviewed following labs and imaging studies  CBC: Recent Labs  Lab 11/11/20 0338 11/11/20 0349 11/12/20 0615  WBC 13.5*  --  5.2  NEUTROABS 11.0*  --   --   HGB 11.6* 13.3 10.3*  HCT 39.7 39.0 34.1*  MCV 83.9  --  81.2  PLT 269  --  166   Basic Metabolic Panel: Recent Labs  Lab 11/11/20 0349 11/11/20 1926 11/12/20 0615  NA 133* 133* 134*  K 4.8 4.8 4.0  CL 102 100 102  CO2  --  24 23  GLUCOSE 200* 105* 137*  BUN 41* 33* 26*  CREATININE 0.70 0.74 0.79  CALCIUM  --  9.1 8.9  MG  --   --  2.0   GFR: Estimated Creatinine Clearance: 50.3 mL/min (by C-G formula based on SCr of 0.79 mg/dL). Liver Function Tests: Recent Labs  Lab 11/11/20 1926  AST 33  ALT 22  ALKPHOS 56  BILITOT 0.9  PROT 6.3*  ALBUMIN 2.6*   Recent Labs  Lab 11/11/20 1926  LIPASE 30   No results for input(s): AMMONIA in the last 168 hours. Coagulation Profile: No results for input(s): INR, PROTIME in the last 168 hours. Cardiac Enzymes: No results for input(s): CKTOTAL, CKMB, CKMBINDEX, TROPONINI in the last 168 hours. BNP (last 3 results) No results for input(s): PROBNP in the last 8760 hours. HbA1C: No results for input(s): HGBA1C in the last 72 hours. CBG: Recent Labs  Lab 11/11/20 1614 11/11/20 2036 11/11/20 2351 11/12/20 0356 11/12/20 0752  GLUCAP 110* 101* 188* 144* 183*   Lipid Profile: No results for input(s): CHOL, HDL, LDLCALC, TRIG,  CHOLHDL, LDLDIRECT in the last 72 hours. Thyroid Function Tests: No results for input(s): TSH, T4TOTAL, FREET4, T3FREE, THYROIDAB in the last 72 hours. Anemia Panel: No results for input(s): VITAMINB12, FOLATE, FERRITIN, TIBC, IRON, RETICCTPCT in the last 72 hours. Sepsis Labs: Recent Labs  Lab 11/11/20 0620  LATICACIDVEN 4.3*    Recent Results (from the past 240 hour(s))  Respiratory Panel by RT PCR (Flu A&B, Covid) - Nasopharyngeal Swab     Status: None   Collection Time: 11/11/20  1:44 PM   Specimen: Nasopharyngeal Swab  Result Value Ref Range Status   SARS Coronavirus 2 by RT PCR NEGATIVE NEGATIVE Final    Comment: (NOTE) SARS-CoV-2 target nucleic acids are NOT DETECTED.  The SARS-CoV-2 RNA is generally detectable in upper respiratoy specimens during the acute phase of infection. The lowest concentration of SARS-CoV-2 viral copies this assay can detect is 131 copies/mL. A negative result does not preclude SARS-Cov-2 infection and should not be used as the sole  basis for treatment or other patient management decisions. A negative result may occur with  improper specimen collection/handling, submission of specimen other than nasopharyngeal swab, presence of viral mutation(s) within the areas targeted by this assay, and inadequate number of viral copies (<131 copies/mL). A negative result must be combined with clinical observations, patient history, and epidemiological information. The expected result is Negative.  Fact Sheet for Patients:  https://www.moore.com/  Fact Sheet for Healthcare Providers:  https://www.young.biz/  This test is no t yet approved or cleared by the [place] FDA and  has been authorized for detection and/or diagnosis of SARS-CoV-2 by FDA under an Emergency Use Authorization (EUA). This EUA will remain  in effect (meaning this test can be used) for the duration of the COVID-19 declaration under Section  564(b)(1) of the Act, 21 U.S.C. section 360bbb-3(b)(1), unless the authorization is terminated or revoked sooner.     Influenza A by PCR NEGATIVE NEGATIVE Final   Influenza B by PCR NEGATIVE NEGATIVE Final    Comment: (NOTE) The Xpert Xpress SARS-CoV-2/FLU/RSV assay is intended as an aid in  the diagnosis of influenza from Nasopharyngeal swab specimens and  should not be used as a sole basis for treatment. Nasal washings and  aspirates are unacceptable for Xpert Xpress SARS-CoV-2/FLU/RSV  testing.  Fact Sheet for Patients: https://www.moore.com/  Fact Sheet for Healthcare Providers: https://www.young.biz/  This test is not yet approved or cleared by the [place] FDA and  has been authorized for detection and/or diagnosis of SARS-CoV-2 by  FDA under an Emergency Use Authorization (EUA). This EUA will remain  in effect (meaning this test can be used) for the duration of the  Covid-19 declaration under Section 564(b)(1) of the Act, 21  U.S.C. section 360bbb-3(b)(1), unless the authorization is  terminated or revoked. Performed at [facility], [address], [place], [place] [address]   MRSA PCR Screening     Status: None   Collection Time: 11/12/20  4:10 AM   Specimen: Nasal Mucosa; Nasopharyngeal  Result Value Ref Range Status   MRSA by PCR NEGATIVE NEGATIVE Final    Comment:        The GeneXpert MRSA Assay (FDA approved for NASAL specimens only), is one component of a comprehensive MRSA colonization surveillance program. It is not intended to diagnose MRSA infection nor to guide or monitor treatment for MRSA infections. Performed at [facility], [address], [place], [place] [address]          Radiology Studies: CT ABDOMEN PELVIS W CONTRAST  Result Date: 11/11/2020 CLINICAL DATA:  Nonlocalized abdominal pain EXAM: CT ABDOMEN AND PELVIS WITH CONTRAST TECHNIQUE: Multidetector CT imaging of the abdomen  and pelvis was performed using the standard protocol following bolus administration of intravenous contrast. CONTRAST:  OMNIPAQUE IOHEXOL 300 MG/ML  SOLN COMPARISON:  08/14/2020 FINDINGS: Lower chest:  No contributory findings. Hepatobiliary: No focal liver abnormality.Cholecystectomy. No bile duct dilatation. Pancreas: Unremarkable. Spleen: Unremarkable. Adrenals/Urinary Tract: Negative adrenals. Significant decrease in size of the left kidney with no enhancement of the upper and interpolar region. Right renal cysts. Normal cortical enhancement on the right. No hydronephrosis. Distended bladder with dependent high-density material. A Foley catheter is in appropriate position. Stomach/Bowel: No obstruction. No appendicitis. Percutaneous gastrostomy tube in expected position Vascular/Lymphatic: No acute vascular abnormality. Mild atheromatous plaque. No mass or adenopathy. Reproductive:No pathologic findings. Other: No ascites or pneumoperitoneum. Musculoskeletal: No acute abnormalities. Lumbar spine degeneration with mild L4-5 anterolisthesis and L1-2 retrolisthesis. IMPRESSION: 1.  Distended bladder despite a Foley catheter, please correlate with catheter function. 2. Layering debris or hemorrhage within the bladder lumen. 3. Significant volume loss of the left kidney since prior with primarily nonenhancing parenchyma. Prior findings were likely related to infarct rather than infection. Electronically Signed   By: Marnee Spring M.D.   On: 11/11/2020 04:59   DG Chest Portable 1 View  Result Date: 11/11/2020 CLINICAL DATA:  Atrial fibrillation EXAM: PORTABLE CHEST 1 VIEW COMPARISON:  09/18/2020 FINDINGS: Lungs are clear. Mild cardiomegaly is stable. No pneumothorax or pleural effusion. Pulmonary vascularity is normal. No acute bone abnormality. IMPRESSION: No active disease.  Stable cardiomegaly. Electronically Signed   By: Helyn Numbers MD   On: 11/11/2020 04:21        Scheduled Meds: . apixaban   5 mg Per Tube BID  . bethanechol  10 mg Per Tube TID  . Chlorhexidine Gluconate Cloth  6 each Topical Daily  . diclofenac Sodium  2 g Topical TID  . diltiazem  300 mg Oral Daily  . free water  100 mL Per Tube Q8H  . insulin aspart  0-9 Units Subcutaneous Q4H  . insulin glargine  20 Units Subcutaneous QHS  . methylphenidate  5 mg Per Tube BID  . metoprolol tartrate  50 mg Per Tube BID  . rosuvastatin  20 mg Per Tube Daily  . saccharomyces boulardii  250 mg Per Tube BID  . senna  2 tablet Per Tube Daily  . zinc oxide   Topical BID   Continuous Infusions: . sodium chloride 75 mL/hr at 11/12/20 0743  . ceFEPime (MAXIPIME) IV 2 g (11/12/20 0612)  . feeding supplement (JEVITY 1.2 CAL) 1,000 mL (11/11/20 2152)          Glade Lloyd, MD [facility] 11/12/2020, 10:36 AM

## 2020-11-12 NOTE — Progress Notes (Signed)
Initial Nutrition Assessment  DOCUMENTATION CODES:   Not applicable  INTERVENTION:   Tube feeding: -Jevity 1.2 @ 60 ml/hr via PEG -ProSource Tf 45 ml once daily  -Free water flushes 100 ml Q8 hours   Provides: 1768 kcals, 91 grams protein, 1466 ml free water (1766 ml with flushes)  NUTRITION DIAGNOSIS:   Increased nutrient needs related to acute illness as evidenced by estimated needs.  GOAL:   Patient will meet greater than or equal to 90% of their needs  MONITOR:   Skin, TF tolerance, Weight trends, Labs, I & O's  REASON FOR ASSESSMENT:   Consult Enteral/tube feeding initiation and management  ASSESSMENT:   Patient with PMH significant for DM, HTN, and CVA with resulting dysphagia s/p PEG. Presents this admission with sepsis secondary to UTI.   Unable to obtain history from p. Per RD note from last visit on 9/25 pt taking Jevity 1.2 @ 60 ml/hr with Prosource once daily. Unsure of tube feeding history at SNF.   Records indicate pt weighed 64.4 kg on 8/29 and 57.7 kg this admission (10.4% in 3 months, significant for time frame). Suspect malnutrition but unable to diagnose without NFPE. Noted weight increased from last admission with tube feeding increase. Will continue with current rate and increase as needed.   UOP: 1000 ml x 24 hrs   Drips: NS @ 75 ml/hr  Medications: SS novolog, lantus, senokot Labs: Na 134 (L) CBG 101-188  Diet Order:   Diet Order            Diet NPO time specified  Diet effective now                 EDUCATION NEEDS:   Not appropriate for education at this time  Skin:  Skin Assessment: Reviewed RN Assessment  Last BM:  PTA  Height:   Ht Readings from Last 1 Encounters:  11/11/20 5\' 2"  (1.575 m)    Weight:   Wt Readings from Last 1 Encounters:  11/12/20 57.8 kg   BMI:  Body mass index is 23.31 kg/m.  Estimated Nutritional Needs:   Kcal:  1700-1900 kcal  Protein:  85-100 grams  Fluid:  >/= 1.7 L/day  11/14/20 RD, LDN Clinical Nutrition Pager listed in AMION

## 2020-11-12 NOTE — Evaluation (Signed)
Physical Therapy Evaluation Patient Details Name: Samantha Paul MRN: [id number] DOB: 03-31-1946 Today's Date: 11/12/2020   History of Present Illness  Pt adm with septic shock likely due to UTI. Pt also with afib with rvr. PMH - recent rt CVA, DM, HTN, PEG  Clinical Impression  Pt presents to PT dependent with mobility and limited to extremely brief time at the EOB due to pain. Recommend return to SNF at DC.     Follow Up Recommendations SNF    Equipment Recommendations  None recommended by PT    Recommendations for Other Services       Precautions / Restrictions        Mobility  Bed Mobility Overal bed mobility: Needs Assistance Bed Mobility: Supine to Sit;Sit to Supine     Supine to sit: +2 for physical assistance;Total assist Sit to supine: +2 for physical assistance;Total assist   General bed mobility comments: Assist for all aspects.     Transfers                    Ambulation/Gait                Stairs            Wheelchair Mobility    Modified Rankin (Stroke Patients Only)       Balance Overall balance assessment: Needs assistance Sitting-balance support: Single extremity supported;Feet unsupported Sitting balance-Leahy Scale: Zero Sitting balance - Comments: Pt on EOB only 15 sec with +2 max assist due to pt appeared to be uncomfortable and to be having pain.  Postural control: Posterior lean                                   Pertinent Vitals/Pain Pain Assessment: Faces Faces Pain Scale: Hurts whole lot Pain Location: Pt unable to specify due to limited communication. Appears to be primarily LE's and maybe rt side of abdomen Pain Descriptors / Indicators: Moaning;Grimacing;Restless    Home Living Family/patient expects to be discharged to:: Skilled nursing facility                      Prior Function Level of Independence: Needs assistance   Gait / Transfers Assistance Needed: total assist since CVA  for bed mobility. Neede mechanical lift for OOB           Hand Dominance   Dominant Hand: Right    Extremity/Trunk Assessment   Upper Extremity Assessment Upper Extremity Assessment: LUE deficits/detail LUE Deficits / Details: flaccid since recent CVA    Lower Extremity Assessment Lower Extremity Assessment: LLE deficits/detail LLE Deficits / Details: No active movement. Decr passive dorsiflexion       Communication   Communication: Prefers language other than English;Receptive difficulties;Expressive difficulties (aphasia from CVA)  Cognition Arousal/Alertness: Awake/alert Behavior During Therapy: Restless Overall Cognitive Status: Difficult to assess                                        General Comments      Exercises     Assessment/Plan    PT Assessment Patient needs continued PT services  PT Problem List Decreased strength;Decreased activity tolerance;Decreased balance;Decreased mobility;Pain       PT Treatment Interventions DME instruction;Functional mobility training;Therapeutic activities;Therapeutic exercise;Balance training;Patient/family education    PT Goals (Current goals  can be found in the Care Plan section)  Acute Rehab PT Goals Patient Stated Goal: Pt unable  PT Goal Formulation: Patient unable to participate in goal setting Time For Goal Achievement: 11/26/20 Potential to Achieve Goals: Fair    Frequency Min 2X/week   Barriers to discharge        Co-evaluation               AM-PAC PT "6 Clicks" Mobility  Outcome Measure Help needed turning from your back to your side while in a flat bed without using bedrails?: Total Help needed moving from lying on your back to sitting on the side of a flat bed without using bedrails?: Total Help needed moving to and from a bed to a chair (including a wheelchair)?: Total Help needed standing up from a chair using your arms (e.g., wheelchair or bedside chair)?: Total Help  needed to walk in hospital room?: Total Help needed climbing 3-5 steps with a railing? : Total 6 Click Score: 6    End of Session   Activity Tolerance: Patient limited by pain Patient left: in bed;with call bell/phone within reach;with bed alarm set   PT Visit Diagnosis: Other abnormalities of gait and mobility (R26.89);Hemiplegia and hemiparesis Hemiplegia - Right/Left: Left Hemiplegia - dominant/non-dominant: Non-dominant Hemiplegia - caused by: Cerebral infarction    Time: 3335-4562 PT Time Calculation (min) (ACUTE ONLY): 13 min   Charges:   PT Evaluation $PT Eval Moderate Complexity: 1 Mod          [facility] PT Acute Rehabilitation Services Pager [phone] Office [phone]   Angelina Ok [facility] 11/12/2020, 1:48 PM

## 2020-11-13 ENCOUNTER — Inpatient Hospital Stay (HOSPITAL_COMMUNITY): Payer: Medicare Other

## 2020-11-13 DIAGNOSIS — A419 Sepsis, unspecified organism: Secondary | ICD-10-CM

## 2020-11-13 DIAGNOSIS — Z978 Presence of other specified devices: Secondary | ICD-10-CM

## 2020-11-13 DIAGNOSIS — R6521 Severe sepsis with septic shock: Secondary | ICD-10-CM

## 2020-11-13 LAB — GLUCOSE, CAPILLARY
Glucose-Capillary: 108 mg/dL — ABNORMAL HIGH (ref 70–99)
Glucose-Capillary: 156 mg/dL — ABNORMAL HIGH (ref 70–99)
Glucose-Capillary: 196 mg/dL — ABNORMAL HIGH (ref 70–99)
Glucose-Capillary: 202 mg/dL — ABNORMAL HIGH (ref 70–99)
Glucose-Capillary: 212 mg/dL — ABNORMAL HIGH (ref 70–99)
Glucose-Capillary: 75 mg/dL (ref 70–99)

## 2020-11-13 LAB — CBC
HCT: 33.1 % — ABNORMAL LOW (ref 36.0–46.0)
Hemoglobin: 10 g/dL — ABNORMAL LOW (ref 12.0–15.0)
MCH: 24.6 pg — ABNORMAL LOW (ref 26.0–34.0)
MCHC: 30.2 g/dL (ref 30.0–36.0)
MCV: 81.5 fL (ref 80.0–100.0)
Platelets: 184 10*3/uL (ref 150–400)
RBC: 4.06 MIL/uL (ref 3.87–5.11)
RDW: 14.6 % (ref 11.5–15.5)
WBC: 8.2 10*3/uL (ref 4.0–10.5)
nRBC: 0 % (ref 0.0–0.2)

## 2020-11-13 LAB — LACTIC ACID, PLASMA: Lactic Acid, Venous: 1.9 mmol/L (ref 0.5–1.9)

## 2020-11-13 LAB — BASIC METABOLIC PANEL
Anion gap: 11 (ref 5–15)
BUN: 26 mg/dL — ABNORMAL HIGH (ref 8–23)
CO2: 19 mmol/L — ABNORMAL LOW (ref 22–32)
Calcium: 8.4 mg/dL — ABNORMAL LOW (ref 8.9–10.3)
Chloride: 105 mmol/L (ref 98–111)
Creatinine, Ser: 0.85 mg/dL (ref 0.44–1.00)
GFR, Estimated: >60 mL/min (ref >60)
Glucose, Bld: 178 mg/dL — ABNORMAL HIGH (ref 70–99)
Potassium: 4.5 mmol/L (ref 3.5–5.1)
Sodium: 135 mmol/L (ref 135–145)

## 2020-11-13 LAB — URINE CULTURE: Culture: >=100000 — AB

## 2020-11-13 LAB — MAGNESIUM: Magnesium: 1.9 mg/dL (ref 1.7–2.4)

## 2020-11-13 MED ORDER — ORAL CARE MOUTH RINSE
15.0000 mL | Freq: Two times a day (BID) | OROMUCOSAL | Status: DC
  Administered 2020-11-13 – 2020-11-17 (×9): 15 mL via OROMUCOSAL

## 2020-11-13 MED ORDER — CHLORHEXIDINE GLUCONATE 0.12 % MT SOLN
15.0000 mL | Freq: Two times a day (BID) | OROMUCOSAL | Status: DC
  Administered 2020-11-13 – 2020-11-18 (×10): 15 mL via OROMUCOSAL
  Filled 2020-11-13 (×9): qty 15

## 2020-11-13 MED ORDER — SENNOSIDES 8.8 MG/5ML PO SYRP
10.0000 mL | ORAL_SOLUTION | Freq: Every day | ORAL | Status: DC
  Administered 2020-11-13 – 2020-11-15 (×3): 10 mL
  Filled 2020-11-13 (×7): qty 10

## 2020-11-13 MED ORDER — METOPROLOL TARTRATE 25 MG/10 ML ORAL SUSPENSION
50.0000 mg | Freq: Two times a day (BID) | ORAL | Status: DC
  Administered 2020-11-13 – 2020-11-18 (×11): 50 mg
  Filled 2020-11-13 (×14): qty 20

## 2020-11-13 NOTE — Progress Notes (Signed)
Pt coughing up tube feed colored phlegm, Holding tube feeds for night per Dr.Alekh.

## 2020-11-13 NOTE — Progress Notes (Signed)
Patient ID: Samantha Paul, female   DOB: 08-Aug-1946, 74 y.o.   MRN: [id number]  PROGRESS NOTE    Samantha Paul  PRF:[id number] DOB: 1946-12-04 DOA: 11/11/2020 PCP: Rometta Emery, MD   Brief Narrative:  74 y.o. female with medical history significant for atrial fibrillation on Eliquis, insulin-dependent diabetes mellitus, hypertension, and history of unspecified CVA with aphasia, dysphagia status post PEG tube placement, neurogenic bladder status post indwelling catheter, and motor deficits presented from SNF for evaluation of abdominal pain.  On presentation, she was slightly tachypneic, tachycardic in the 140s with stable blood pressure.  Chest x-ray showed no acute infiltrates.  CT of the abdomen and pelvis showed distended bladder with layering debris or hemorrhage within the urinary bladder and significant volume loss of the left kidney likely reflecting a prior infarction.  Foley catheter was changed.  UA was concerning for UTI.  WBCs of 13,500.  She was started on IV fluids, antibiotics and also started on Cardizem drip.  Subsequently cardiology was consulted.  Palliative care was also consulted for goals of care discussion.  Assessment & Plan:   Septic shock: Present on admission probably secondary to UTI UTI, catheter associated -Patient presented with abdominal pain and was found to be tachypneic, tachycardic with leukocytosis, evidence of UTI and initial lactic acid of 4.3. -Continue cefepime.  Urine culture is growing Proteus mirabilis.  Follow sensitivities blood culture negative so far.  DC IV fluids. -Blood pressure currently stable. -Patient has indwelling Foley catheter which was changed in the ED.  Outpatient follow-up with urology  Paroxysmal A. fib with RVR -Cardiology signed off on 11/12/2020.  Cardizem drip has been switched to oral Cardizem.  Continue metoprolol and Eliquis.  Outpatient follow-up with cardiology  Leukocytosis -resolved  History of unspecified CVA with residual  motor deficits, aphasia, dysphagia status post PEG tube placement -Patient has history of CVA with left-sided weakness, dysphagia and aphasia and is dependent on PEG feedings.  Continue tube feedings and free water through PEG tube.  Continue Eliquis and statin. -Overall prognosis is poor.  Palliative care has been reconsulted for continuation of goals of care discussion -Fall precautions  Mild hyponatremia -Improved  Probable anemia of chronic disease -From recent chronic illnesses.  Hemoglobin stable.  Diabetes mellitus type 2 with hyperglycemia -A1c 7.2 in September 2021.  Continue CBGs with SSI.  Continue Lantus   DVT prophylaxis: Eliquis Code Status: Full Family Communication: Husband at bedside along with translator as well Disposition Plan: Status is: Inpatient  Remains inpatient appropriate because:Inpatient level of care appropriate due to severity of illness   Dispo: The patient is from: SNF              Anticipated d/c is to: SNF              Anticipated d/c date is: 2 days              Patient currently is not medically stable to d/c.   Consultants: Palliative care/cardiology  Procedures: None  Antimicrobials:  Cefepime from 11/10/2020 onwards  Subjective: Please seen and examined at bedside.  Communicated with husband via translator present at bedside.  Patient is an extremely poor historian.  Awake, does not follow commands or answer any questions.  No overnight fever, vomiting reported.  Objective: Vitals:   11/12/20 1610 11/12/20 1958 11/12/20 2346 11/13/20 0455  BP: 117/73 124/85 120/85 135/89  Pulse: 64  69 97  Resp: 20 (!) 23 20 18   Temp: 98.5 F (36.9 C)  98 F (36.7 C) 98 F (36.7 C) 98.3 F (36.8 C)  TempSrc:  Oral Oral Axillary  SpO2:  99% 100% 100%  Weight:      Height:        Intake/Output Summary (Last 24 hours) at 11/13/2020 0722 Last data filed at 11/13/2020 0600 Gross per 24 hour  Intake 4325.59 ml  Output 525 ml  Net 3800.59  ml   Filed Weights   11/11/20 0600 11/11/20 1909 11/12/20 0500  Weight: 54.9 kg 57.7 kg 57.8 kg    Examination:  General exam: No acute distress.  Looks chronically ill and very deconditioned. Respiratory system: Decreased breath sounds at bases bilaterally with some crackles, no wheezing  cardiovascular system: Rate controlled, S1-S2 heard Gastrointestinal system: Abdomen is nondistended, soft and nontender.  Bowel sounds are heard.  PEG tube present.   Extremities: Mild lower extremity edema present.  No clubbing  Central nervous system:Awake, does not follow commands or answer any questions, poor historian.  Aphasic.  Left arm flaccid.  Intermittently moans Skin: No obvious petechiae/rashes  psychiatry: Cannot assess because of mental status  Data Reviewed: I have personally reviewed following labs and imaging studies  CBC: Recent Labs  Lab 11/11/20 0338 11/11/20 0349 11/12/20 0615 11/13/20 0154  WBC 13.5*  --  5.2 8.2  NEUTROABS 11.0*  --   --   --   HGB 11.6* 13.3 10.3* 10.0*  HCT 39.7 39.0 34.1* 33.1*  MCV 83.9  --  81.2 81.5  PLT 269  --  166 184   Basic Metabolic Panel: Recent Labs  Lab 11/11/20 0349 11/11/20 1926 11/12/20 0615 11/13/20 0154  NA 133* 133* 134* 135  K 4.8 4.8 4.0 4.5  CL 102 100 102 105  CO2  --  24 23 19*  GLUCOSE 200* 105* 137* 178*  BUN 41* 33* 26* 26*  CREATININE 0.70 0.74 0.79 0.85  CALCIUM  --  9.1 8.9 8.4*  MG  --   --  2.0 1.9   GFR: Estimated Creatinine Clearance: 47.3 mL/min (by C-G formula based on SCr of 0.85 mg/dL). Liver Function Tests: Recent Labs  Lab 11/11/20 1926  AST 33  ALT 22  ALKPHOS 56  BILITOT 0.9  PROT 6.3*  ALBUMIN 2.6*   Recent Labs  Lab 11/11/20 1926  LIPASE 30   No results for input(s): AMMONIA in the last 168 hours. Coagulation Profile: No results for input(s): INR, PROTIME in the last 168 hours. Cardiac Enzymes: No results for input(s): CKTOTAL, CKMB, CKMBINDEX, TROPONINI in the last 168  hours. BNP (last 3 results) No results for input(s): PROBNP in the last 8760 hours. HbA1C: No results for input(s): HGBA1C in the last 72 hours. CBG: Recent Labs  Lab 11/12/20 1143 11/12/20 1603 11/12/20 2048 11/12/20 2349 11/13/20 0454  GLUCAP 186* 173* 141* 114* 196*   Lipid Profile: No results for input(s): CHOL, HDL, LDLCALC, TRIG, CHOLHDL, LDLDIRECT in the last 72 hours. Thyroid Function Tests: No results for input(s): TSH, T4TOTAL, FREET4, T3FREE, THYROIDAB in the last 72 hours. Anemia Panel: No results for input(s): VITAMINB12, FOLATE, FERRITIN, TIBC, IRON, RETICCTPCT in the last 72 hours. Sepsis Labs: Recent Labs  Lab 11/11/20 0620 11/13/20 0201  LATICACIDVEN 4.3* 1.9    Recent Results (from the past 240 hour(s))  Respiratory Panel by RT PCR (Flu A&B, Covid) - Nasopharyngeal Swab     Status: None   Collection Time: 11/11/20  1:44 PM   Specimen: Nasopharyngeal Swab  Result Value Ref Range Status  SARS Coronavirus 2 by RT PCR NEGATIVE NEGATIVE Final    Comment: (NOTE) SARS-CoV-2 target nucleic acids are NOT DETECTED.  The SARS-CoV-2 RNA is generally detectable in upper respiratoy specimens during the acute phase of infection. The lowest concentration of SARS-CoV-2 viral copies this assay can detect is 131 copies/mL. A negative result does not preclude SARS-Cov-2 infection and should not be used as the sole basis for treatment or other patient management decisions. A negative result may occur with  improper specimen collection/handling, submission of specimen other than nasopharyngeal swab, presence of viral mutation(s) within the areas targeted by this assay, and inadequate number of viral copies (<131 copies/mL). A negative result must be combined with clinical observations, patient history, and epidemiological information. The expected result is Negative.  Fact Sheet for Patients:  https://www.moore.com/  Fact Sheet for Healthcare  Providers:  https://www.young.biz/  This test is no t yet approved or cleared by the [place] FDA and  has been authorized for detection and/or diagnosis of SARS-CoV-2 by FDA under an Emergency Use Authorization (EUA). This EUA will remain  in effect (meaning this test can be used) for the duration of the COVID-19 declaration under Section 564(b)(1) of the Act, 21 U.S.C. section 360bbb-3(b)(1), unless the authorization is terminated or revoked sooner.     Influenza A by PCR NEGATIVE NEGATIVE Final   Influenza B by PCR NEGATIVE NEGATIVE Final    Comment: (NOTE) The Xpert Xpress SARS-CoV-2/FLU/RSV assay is intended as an aid in  the diagnosis of influenza from Nasopharyngeal swab specimens and  should not be used as a sole basis for treatment. Nasal washings and  aspirates are unacceptable for Xpert Xpress SARS-CoV-2/FLU/RSV  testing.  Fact Sheet for Patients: https://www.moore.com/  Fact Sheet for Healthcare Providers: https://www.young.biz/  This test is not yet approved or cleared by the [place] FDA and  has been authorized for detection and/or diagnosis of SARS-CoV-2 by  FDA under an Emergency Use Authorization (EUA). This EUA will remain  in effect (meaning this test can be used) for the duration of the  Covid-19 declaration under Section 564(b)(1) of the Act, 21  U.S.C. section 360bbb-3(b)(1), unless the authorization is  terminated or revoked. Performed at [facility], [address] [place], [place] [address]   Culture, blood (routine x 2)     Status: None (Preliminary result)   Collection Time: 11/11/20  4:40 PM   Specimen: BLOOD RIGHT HAND  Result Value Ref Range Status   Specimen Description BLOOD RIGHT HAND  Final   Special Requests   Final    BOTTLES DRAWN AEROBIC ONLY Blood Culture results may not be optimal due to an inadequate volume of blood received in culture bottles   Culture    Final    NO GROWTH < 24 HOURS Performed at [facility], [address] [place], [place] [address]    Report Status PENDING  Incomplete  Urine culture     Status: Abnormal (Preliminary result)   Collection Time: 11/11/20  5:46 PM   Specimen: Urine, Random  Result Value Ref Range Status   Specimen Description URINE, RANDOM  Final   Special Requests NONE  Final   Culture (A)  Final    >=100,000 COLONIES/mL PROTEUS MIRABILIS SUSCEPTIBILITIES TO FOLLOW Performed at [facility], [address], [place], [place] [address]    Report Status PENDING  Incomplete  Culture, blood (routine x 2)     Status: None (Preliminary result)   Collection  Time: 11/11/20  7:46 PM   Specimen: BLOOD RIGHT HAND  Result Value Ref Range Status   Specimen Description BLOOD RIGHT HAND  Final   Special Requests   Final    AEROBIC BOTTLE ONLY Blood Culture results may not be optimal due to an inadequate volume of blood received in culture bottles   Culture   Final    NO GROWTH < 24 HOURS Performed at [facility], [address] [place], [place] [address]    Report Status PENDING  Incomplete  MRSA PCR Screening     Status: None   Collection Time: 11/12/20  4:10 AM   Specimen: Nasal Mucosa; Nasopharyngeal  Result Value Ref Range Status   MRSA by PCR NEGATIVE NEGATIVE Final    Comment:        The GeneXpert MRSA Assay (FDA approved for NASAL specimens only), is one component of a comprehensive MRSA colonization surveillance program. It is not intended to diagnose MRSA infection nor to guide or monitor treatment for MRSA infections. Performed at [facility], [address] [place], [place] [address]          Radiology Studies: No results found.      Scheduled Meds: . acetaminophen  650 mg Per Tube Q6H   Or  . acetaminophen  650 mg Rectal Q6H  . apixaban  5 mg Per Tube BID  . bethanechol  10 mg Per Tube TID  . chlorhexidine  15 mL Mouth Rinse BID  .  Chlorhexidine Gluconate Cloth  6 each Topical Daily  . diclofenac Sodium  2 g Topical TID  . diltiazem  90 mg Per Tube Q8H  . feeding supplement (PROSource TF)  45 mL Per Tube Daily  . free water  100 mL Per Tube Q8H  . insulin aspart  0-9 Units Subcutaneous Q4H  . insulin glargine  20 Units Subcutaneous QHS  . mouth rinse  15 mL Mouth Rinse q12n4p  . methylphenidate  5 mg Per Tube BID  . metoprolol tartrate  50 mg Per Tube BID  . rosuvastatin  20 mg Per Tube Daily  . saccharomyces boulardii  250 mg Per Tube BID  . senna  2 tablet Per Tube Daily  . zinc oxide   Topical BID   Continuous Infusions: . sodium chloride 75 mL/hr at 11/12/20 0743  . ceFEPime (MAXIPIME) IV 2 g (11/13/20 0513)  . feeding supplement (JEVITY 1.2 CAL) 60 mL/hr at 11/12/20 1900          Keegan Bensch Hanley BenAlekh, MD [facility] 11/13/2020, 7:22 AM

## 2020-11-14 DIAGNOSIS — Z66 Do not resuscitate: Secondary | ICD-10-CM

## 2020-11-14 LAB — GLUCOSE, CAPILLARY
Glucose-Capillary: 100 mg/dL — ABNORMAL HIGH (ref 70–99)
Glucose-Capillary: 107 mg/dL — ABNORMAL HIGH (ref 70–99)
Glucose-Capillary: 161 mg/dL — ABNORMAL HIGH (ref 70–99)
Glucose-Capillary: 119 mg/dL — ABNORMAL HIGH (ref 70–99)
Glucose-Capillary: 136 mg/dL — ABNORMAL HIGH (ref 70–99)

## 2020-11-14 LAB — CBC
HCT: 32.6 % — ABNORMAL LOW (ref 36.0–46.0)
Hemoglobin: 9.5 g/dL — ABNORMAL LOW (ref 12.0–15.0)
MCH: 24.1 pg — ABNORMAL LOW (ref 26.0–34.0)
MCHC: 29.1 g/dL — ABNORMAL LOW (ref 30.0–36.0)
MCV: 82.5 fL (ref 80.0–100.0)
Platelets: 165 10*3/uL (ref 150–400)
RBC: 3.95 MIL/uL (ref 3.87–5.11)
RDW: 14.5 % (ref 11.5–15.5)
WBC: 7.3 10*3/uL (ref 4.0–10.5)
nRBC: 0 % (ref 0.0–0.2)

## 2020-11-14 LAB — MAGNESIUM: Magnesium: 1.9 mg/dL (ref 1.7–2.4)

## 2020-11-14 LAB — BASIC METABOLIC PANEL
Anion gap: 9 (ref 5–15)
BUN: 25 mg/dL — ABNORMAL HIGH (ref 8–23)
CO2: 22 mmol/L (ref 22–32)
Calcium: 8.7 mg/dL — ABNORMAL LOW (ref 8.9–10.3)
Chloride: 103 mmol/L (ref 98–111)
Creatinine, Ser: 0.81 mg/dL (ref 0.44–1.00)
GFR, Estimated: >60 mL/min (ref >60)
Glucose, Bld: 120 mg/dL — ABNORMAL HIGH (ref 70–99)
Potassium: 4.6 mmol/L (ref 3.5–5.1)
Sodium: 134 mmol/L — ABNORMAL LOW (ref 135–145)

## 2020-11-14 MED ORDER — JEVITY 1.2 CAL PO LIQD
1000.0000 mL | ORAL | Status: DC
  Administered 2020-11-14 – 2020-11-15 (×2): 1000 mL
  Filled 2020-11-14 (×4): qty 1000

## 2020-11-14 MED ORDER — SODIUM CHLORIDE 0.9 % IV SOLN
1.0000 g | INTRAVENOUS | Status: DC
  Administered 2020-11-14: 1 g via INTRAVENOUS
  Filled 2020-11-14: qty 1
  Filled 2020-11-14: qty 10

## 2020-11-14 NOTE — Progress Notes (Signed)
Patient ID: Samantha Paul, female   DOB: [birth year], 74 y.o.   MRN: [id number]  PROGRESS NOTE    Samantha Paul  MVE:[id number] DOB: [birth year] DOA: 11/11/2020 PCP: Rometta Emery, MD   Brief Narrative:  74 y.o. female with medical history significant for atrial fibrillation on Eliquis, insulin-dependent diabetes mellitus, hypertension, and history of unspecified CVA with aphasia, dysphagia status post PEG tube placement, neurogenic bladder status post indwelling catheter, and motor deficits presented from SNF for evaluation of abdominal pain.  On presentation, she was slightly tachypneic, tachycardic in the 140s with stable blood pressure.  Chest x-ray showed no acute infiltrates.  CT of the abdomen and pelvis showed distended bladder with layering debris or hemorrhage within the urinary bladder and significant volume loss of the left kidney likely reflecting a prior infarction.  Foley catheter was changed.  UA was concerning for UTI.  WBCs of 13,500.  She was started on IV fluids, antibiotics and also started on Cardizem drip.  Subsequently cardiology was consulted.  Palliative care was also consulted for goals of care discussion.  Assessment & Plan:   Septic shock: Present on admission probably secondary to UTI UTI, catheter associated -Patient presented with abdominal pain and was found to be tachypneic, tachycardic with leukocytosis, evidence of UTI and initial lactic acid of 4.3. -Continue cefepime.  Urine culture is growing Proteus mirabilis.  Switch to Rocephin.  Off IV fluids -Blood pressure currently stable. -Patient has indwelling Foley catheter which was changed in the ED.  Outpatient follow-up with urology  Paroxysmal A. fib with RVR -Cardiology signed off on 11/12/2020.  Cardizem drip has been switched to oral Cardizem.  Continue metoprolol and Eliquis.  Outpatient follow-up with cardiology  Leukocytosis -resolved  History of unspecified CVA with residual motor deficits, aphasia,  dysphagia status post PEG tube placement -Patient has history of CVA with left-sided weakness, dysphagia and aphasia and is dependent on PEG feedings.  Continue tube feedings and free water through PEG tube.  Continue Eliquis and statin. -Overall prognosis is poor.  Palliative care has been reconsulted for continuation of goals of care discussion -Fall precautions -Tube feed was yesterday afternoon because of increasing cough with coughing up tube feeding colored phlegm as per nursing staff.  Consider restarting it at a lower rate if cough is improving.  Mild hyponatremia -Monitor  Probable anemia of chronic disease -From recent chronic illnesses.  Hemoglobin stable.  Diabetes mellitus type 2 with hyperglycemia -A1c 7.2 in September 2021.  Continue CBGs with SSI.  Continue Lantus   DVT prophylaxis: Eliquis Code Status: Full Family Communication: Husband at bedside along with translator as well on 11/13/2020 Disposition Plan: Status is: Inpatient  Remains inpatient appropriate because:Inpatient level of care appropriate due to severity of illness   Dispo: The patient is from: SNF              Anticipated d/c is to: SNF              Anticipated d/c date is: 2 days              Patient currently is not medically stable to d/c.   Consultants: Palliative care/cardiology  Procedures: None  Antimicrobials:  Cefepime from 11/10/2020 onwards  Subjective: Please seen and examined at bedside.  Sleepy, wakes up slightly, does not follow commands or answer any questions.  Very poor historian.  No overnight fever, vomiting reported. increasing cough with coughing up tube feeding colored phlegm as per nursing staff yesterday afternoon.  Objective: Vitals:   11/13/20 2204 11/14/20 0000 11/14/20 0500 11/14/20 0538  BP: 131/88 123/77  135/66  Pulse:    96  Resp: 14 11  16   Temp: 98.2 F (36.8 C) 98.7 F (37.1 C)  98.1 F (36.7 C)  TempSrc: Oral Axillary  Oral  SpO2: 100% 93%  100%   Weight:   60.6 kg   Height:        Intake/Output Summary (Last 24 hours) at 11/14/2020 0726 Last data filed at 11/14/2020 0547 Gross per 24 hour  Intake 724 ml  Output 800 ml  Net -76 ml   Filed Weights   11/11/20 1909 11/12/20 0500 11/14/20 0500  Weight: 57.7 kg 57.8 kg 60.6 kg    Examination:  General exam: No distress.  Looks chronically ill and very deconditioned. Respiratory system: Bilateral decreased breath sounds at bases with some crackles cardiovascular system: S1-S2 heard, rate controlled Gastrointestinal system: Abdomen is nondistended, soft and mildly tender in the periumbilical region.  Normal bowel sounds heard.  PEG tube present.   Extremities: No cyanosis.  Trace lower extremity edema present bilaterally  Central nervous system:Sleepy, wakes up slightly, does not follow commands or answer any questions.  Very poor historian.  Aphasic.  Left arm flaccid.  Intermittently moans Skin: No obvious ecchymosis/lesions psychiatry: Could not be assessed because of mental status  Data Reviewed: I have personally reviewed following labs and imaging studies  CBC: Recent Labs  Lab 11/11/20 0338 11/11/20 0349 11/12/20 0615 11/13/20 0154 11/14/20 0112  WBC 13.5*  --  5.2 8.2 7.3  NEUTROABS 11.0*  --   --   --   --   HGB 11.6* 13.3 10.3* 10.0* 9.5*  HCT 39.7 39.0 34.1* 33.1* 32.6*  MCV 83.9  --  81.2 81.5 82.5  PLT 269  --  166 184 165   Basic Metabolic Panel: Recent Labs  Lab 11/11/20 0349 11/11/20 1926 11/12/20 0615 11/13/20 0154 11/14/20 0112  NA 133* 133* 134* 135 134*  K 4.8 4.8 4.0 4.5 4.6  CL 102 100 102 105 103  CO2  --  24 23 19* 22  GLUCOSE 200* 105* 137* 178* 120*  BUN 41* 33* 26* 26* 25*  CREATININE 0.70 0.74 0.79 0.85 0.81  CALCIUM  --  9.1 8.9 8.4* 8.7*  MG  --   --  2.0 1.9 1.9   GFR: Estimated Creatinine Clearance: 53.8 mL/min (by C-G formula based on SCr of 0.81 mg/dL). Liver Function Tests: Recent Labs  Lab 11/11/20 1926  AST 33    ALT 22  ALKPHOS 56  BILITOT 0.9  PROT 6.3*  ALBUMIN 2.6*   Recent Labs  Lab 11/11/20 1926  LIPASE 30   No results for input(s): AMMONIA in the last 168 hours. Coagulation Profile: No results for input(s): INR, PROTIME in the last 168 hours. Cardiac Enzymes: No results for input(s): CKTOTAL, CKMB, CKMBINDEX, TROPONINI in the last 168 hours. BNP (last 3 results) No results for input(s): PROBNP in the last 8760 hours. HbA1C: No results for input(s): HGBA1C in the last 72 hours. CBG: Recent Labs  Lab 11/13/20 1205 11/13/20 1644 11/13/20 2208 11/13/20 2353 11/14/20 0443  GLUCAP 212* 156* 75 108* 100*   Lipid Profile: No results for input(s): CHOL, HDL, LDLCALC, TRIG, CHOLHDL, LDLDIRECT in the last 72 hours. Thyroid Function Tests: No results for input(s): TSH, T4TOTAL, FREET4, T3FREE, THYROIDAB in the last 72 hours. Anemia Panel: No results for input(s): VITAMINB12, FOLATE, FERRITIN, TIBC, IRON, RETICCTPCT in the  last 72 hours. Sepsis Labs: Recent Labs  Lab 11/11/20 0620 11/13/20 0201  LATICACIDVEN 4.3* 1.9    Recent Results (from the past 240 hour(s))  Respiratory Panel by RT PCR (Flu A&B, Covid) - Nasopharyngeal Swab     Status: None   Collection Time: 11/11/20  1:44 PM   Specimen: Nasopharyngeal Swab  Result Value Ref Range Status   SARS Coronavirus 2 by RT PCR NEGATIVE NEGATIVE Final    Comment: (NOTE) SARS-CoV-2 target nucleic acids are NOT DETECTED.  The SARS-CoV-2 RNA is generally detectable in upper respiratoy specimens during the acute phase of infection. The lowest concentration of SARS-CoV-2 viral copies this assay can detect is 131 copies/mL. A negative result does not preclude SARS-Cov-2 infection and should not be used as the sole basis for treatment or other patient management decisions. A negative result may occur with  improper specimen collection/handling, submission of specimen other than nasopharyngeal swab, presence of viral mutation(s)  within the areas targeted by this assay, and inadequate number of viral copies (<131 copies/mL). A negative result must be combined with clinical observations, patient history, and epidemiological information. The expected result is Negative.  Fact Sheet for Patients:  https://www.moore.com/  Fact Sheet for Healthcare Providers:  https://www.young.biz/  This test is no t yet approved or cleared by the [place] FDA and  has been authorized for detection and/or diagnosis of SARS-CoV-2 by FDA under an Emergency Use Authorization (EUA). This EUA will remain  in effect (meaning this test can be used) for the duration of the COVID-19 declaration under Section 564(b)(1) of the Act, 21 U.S.C. section 360bbb-3(b)(1), unless the authorization is terminated or revoked sooner.     Influenza A by PCR NEGATIVE NEGATIVE Final   Influenza B by PCR NEGATIVE NEGATIVE Final    Comment: (NOTE) The Xpert Xpress SARS-CoV-2/FLU/RSV assay is intended as an aid in  the diagnosis of influenza from Nasopharyngeal swab specimens and  should not be used as a sole basis for treatment. Nasal washings and  aspirates are unacceptable for Xpert Xpress SARS-CoV-2/FLU/RSV  testing.  Fact Sheet for Patients: https://www.moore.com/  Fact Sheet for Healthcare Providers: https://www.young.biz/  This test is not yet approved or cleared by the [place] FDA and  has been authorized for detection and/or diagnosis of SARS-CoV-2 by  FDA under an Emergency Use Authorization (EUA). This EUA will remain  in effect (meaning this test can be used) for the duration of the  Covid-19 declaration under Section 564(b)(1) of the Act, 21  U.S.C. section 360bbb-3(b)(1), unless the authorization is  terminated or revoked. Performed at [facility], [address] [place], [place] [address]   Culture, blood (routine x 2)     Status: None  (Preliminary result)   Collection Time: 11/11/20  4:40 PM   Specimen: BLOOD RIGHT HAND  Result Value Ref Range Status   Specimen Description BLOOD RIGHT HAND  Final   Special Requests   Final    BOTTLES DRAWN AEROBIC ONLY Blood Culture results may not be optimal due to an inadequate volume of blood received in culture bottles   Culture   Final    NO GROWTH 2 DAYS Performed at [facility], [address], [place], [place] [address]    Report Status PENDING  Incomplete  Urine culture     Status: Abnormal   Collection Time: 11/11/20  5:46 PM   Specimen: Urine, Random  Result Value Ref Range Status   Specimen Description URINE, RANDOM  Final   Special Requests   Final    NONE Performed at [facility], [address], [place], [place] [address]    Culture >=100,000 COLONIES/mL PROTEUS MIRABILIS (A)  Final   Report Status 11/13/2020 FINAL  Final   Organism ID, Bacteria PROTEUS MIRABILIS (A)  Final      Susceptibility   Proteus mirabilis - MIC*    AMPICILLIN <=2 SENSITIVE Sensitive     CEFAZOLIN <=4 SENSITIVE Sensitive     CEFEPIME <=0.12 SENSITIVE Sensitive     CEFTRIAXONE <=0.25 SENSITIVE Sensitive     CIPROFLOXACIN >=4 RESISTANT Resistant     GENTAMICIN <=1 SENSITIVE Sensitive     IMIPENEM 2 SENSITIVE Sensitive     NITROFURANTOIN 128 RESISTANT Resistant     TRIMETH/SULFA 40 SENSITIVE Sensitive     AMPICILLIN/SULBACTAM <=2 SENSITIVE Sensitive     PIP/TAZO <=4 SENSITIVE Sensitive     * >=100,000 COLONIES/mL PROTEUS MIRABILIS  Culture, blood (routine x 2)     Status: None (Preliminary result)   Collection Time: 11/11/20  7:46 PM   Specimen: BLOOD RIGHT HAND  Result Value Ref Range Status   Specimen Description BLOOD RIGHT HAND  Final   Special Requests   Final    AEROBIC BOTTLE ONLY Blood Culture results may not be optimal due to an inadequate volume of blood received in culture bottles   Culture   Final    NO GROWTH 2 DAYS Performed at [facility],  [address] [place], [place] [address]    Report Status PENDING  Incomplete  MRSA PCR Screening     Status: None   Collection Time: 11/12/20  4:10 AM   Specimen: Nasal Mucosa; Nasopharyngeal  Result Value Ref Range Status   MRSA by PCR NEGATIVE NEGATIVE Final    Comment:        The GeneXpert MRSA Assay (FDA approved for NASAL specimens only), is one component of a comprehensive MRSA colonization surveillance program. It is not intended to diagnose MRSA infection nor to guide or monitor treatment for MRSA infections. Performed at [facility], [address], [place], [place] [address]          Radiology Studies: DG CHEST PORT 1 VIEW  Result Date: 11/13/2020 CLINICAL DATA:  74 year old female with decreased breath sounds. EXAM: PORTABLE CHEST 1 VIEW COMPARISON:  Chest radiograph dated 11/11/2020. FINDINGS: There is diffuse chronic interstitial coarsening and mild bronchitic changes. No focal consolidation, pleural effusion or pneumothorax. Mild cardiomegaly. No acute osseous pathology. IMPRESSION: 1. No acute cardiopulmonary process. 2. Mild cardiomegaly. Electronically Signed   By: Elgie Collard M.D.   On: 11/13/2020 16:44        Scheduled Meds: . acetaminophen  650 mg Per Tube Q6H   Or  . acetaminophen  650 mg Rectal Q6H  . apixaban  5 mg Per Tube BID  . bethanechol  10 mg Per Tube TID  . chlorhexidine  15 mL Mouth Rinse BID  . Chlorhexidine Gluconate Cloth  6 each Topical Daily  . diclofenac Sodium  2 g Topical TID  . diltiazem  90 mg Per Tube Q8H  . feeding supplement (PROSource TF)  45 mL Per Tube Daily  . free water  100 mL Per Tube Q8H  . insulin aspart  0-9 Units Subcutaneous Q4H  . insulin glargine  20 Units Subcutaneous QHS  . mouth rinse  15 mL Mouth Rinse q12n4p  . methylphenidate  5 mg Per Tube BID  .  metoprolol tartrate  50 mg Per Tube BID  . rosuvastatin  20 mg Per Tube Daily  . saccharomyces boulardii  250 mg Per Tube BID  . sennosides   10 mL Per Tube QHS  . zinc oxide   Topical BID   Continuous Infusions: . ceFEPime (MAXIPIME) IV 2 g (11/14/20 0547)  . feeding supplement (JEVITY 1.2 CAL) 60 mL/hr at 11/12/20 1900          Brax Walen Hanley BenAlekh, MD [facility] 11/14/2020, 7:26 AM

## 2020-11-14 NOTE — Progress Notes (Signed)
Civil engineer, contracting [facility])  Received request from [facility] for hospice services at home after discharge.  Chart and pt information under review by [facility] physician.  Hospice eligibility pending at this time.  Hospital liaison spoke with son He to initiate education related to hospice philosophy and services and to answer any questions at this time.  Voicemails also left for [facility]. Son He verbalized understanding of information given.    [facility] send signed and completed DNR home with pt/family.  Please provide prescriptions at discharge as needed to ensure ongoing symptom management until pt can be admitted onto hospice services.    DME needs discussed. Family denies any DME needs at this time. Address has been verified and is correct in the chart.  [facility] information and contact numbers given to son He.  Above information shared with Shaune Pascal Manager.  Please call with any questions or concerns.  Thank you for the opportunity to participate in this pt's care.  Gillian Scarce, BSN, RN [employer] [phone] (24h on call)

## 2020-11-14 NOTE — Progress Notes (Signed)
Nutrition Follow-up  RD working remotely.  DOCUMENTATION CODES:   Not applicable  INTERVENTION:  Resume Jevity 1.2 Cal at 20 mL/hr and advance by 20 mL/hr every 12 hours to goal rate of 60 mL/hr (1440 ml goal daily volume) + PROSource TF 45 mL once daily per tube.  Provides 1768 kcal, 91 grams of protein, 1166 mL H2O daily.  Continue free water flush of 100 mL Q8hrs per tube. Provides a total of 1466 mL H2O daily including water in tube feed regimen.   NUTRITION DIAGNOSIS:   Increased nutrient needs related to acute illness as evidenced by estimated needs.  Ongoing.  GOAL:   Patient will meet greater than or equal to 90% of their needs  Progressing with resumption of tube feeds.  MONITOR:   Skin, TF tolerance, Weight trends, Labs, I & O's  REASON FOR ASSESSMENT:   Consult Enteral/tube feeding initiation and management  ASSESSMENT:   Patient with PMH significant for DM, HTN, and CVA with resulting dysphagia s/p PEG. Presents this admission with sepsis secondary to UTI.  RD received consult from MD to restart tube feeds at lower rate and advance slowly. Tube feeds were held yesterday due to increased cough.  Per review of chart the tube feeds were held as patient was coughing up "tube feed colored phlegm." Per review of CT abdomen/pelvis from 11/17 percutaneous gastrostomy tube is in expected position.  Enteral Access: 24 Fr. PEG tube present on admission  Medications reviewed and include: 100 mL free water flush Q8hrs, Novolog 0-9 units Q4hrs, Lantus 20 units QHS, Senokot, ceftriaxone.  Labs reviewed: CBG 75-108, Sodium 134, BUN 25.  I/O: 800 mL UOP yesterday (0.6 mL/kg/hr)  Discussed with RN over the phone.  Palliative medicine is following with patient's family to discuss goals of care.  NUTRITION - FOCUSED PHYSICAL EXAM:  Unable to complete at this time as RD is working remotely.  Diet Order:   Diet Order            Diet NPO time specified  Diet  effective now                EDUCATION NEEDS:   Not appropriate for education at this time  Skin:  Skin Assessment: Reviewed RN Assessment  Last BM:  PTA  Height:   Ht Readings from Last 1 Encounters:  11/11/20 5\' 2"  (1.575 m)   Weight:   Wt Readings from Last 1 Encounters:  11/14/20 60.6 kg   BMI:  Body mass index is 24.44 kg/m.  Estimated Nutritional Needs:   Kcal:  1700-1900 kcal  Protein:  85-100 grams  Fluid:  >/= 1.7 L/day  11/16/20, MS, RD, LDN Pager number available on Amion

## 2020-11-14 NOTE — Progress Notes (Addendum)
Daily Progress Note   Patient Name: Samantha Paul       Date: 11/14/2020 DOB: 10/23/1946  Age: 74 y.o. MRN#: [id number] Attending Physician: Aline August, MD Primary Care Physician: Elwyn Reach, MD Admit Date: 11/11/2020  Reason for Follow-up: Establishing goals of care and Psychosocial/spiritual support  Family meeting 11/14/20 I met with husband and 4 sons in a conference room to discuss diagnosis, prognosis, GOC, EOL wishes, disposition, and options.  We spoke via interpreter Leodis Liverpool) cell [phone], interpreter services [phone]  We discussed her current illness and what it means in the larger context of her ongoing co-morbidities.  We discussed the natural disease trajectory of a large stroke with significant residual deficits. These deficits include inability to move her left side, swallow, and communicate. I explained that these deficits not likely improve over time. We also discussed that urinary infections will be a recurrent issue from needing a chronic Foley catheter.   We discussed at length the issue of quality of life. I asked the family if the patient would be ok living in her current condition, knowing that it is irreversible. Family seems to indicate she would not want to live in this state. Husband shares that patient previously told him (after the first stroke) that she would not want to be prolonged on machines.   The difference between aggressive medical intervention and comfort care was considered in light of the patient's goals of care. We did discuss code status. Encouraged family to consider DNR/DNI status understanding evidenced based poor outcomes in similar hospitalized patients, as the cause of the arrest is likely associated with chronic/terminal disease  rather than a reversible acute cardio-pulmonary event. Family agrees that DNR is appropriate.   This has been a very difficult situation for the family. One son expresses they (the family) can't just say "let her go". I emphasized that they are honoring her wishes by allowing her to pass a natural death with dignity. Husband shares that he previously made a promise to her that "she could take her last breath at home". For this reason, he requests to take patient home with hospice.   Discussed at length the issue of artificial feeding. Emphasized that patient's large stroke has caused irreversible dysphagia. Discussed that artificial feeding is a life prolonging intervention. Emphasized that artificial feeding has not been  shown to promote quality of life in patients with irreversible dysphagia. Focused on the burdens of feeding tubes (possible infection, risk of aspiration, etc.). Discussed that tube feeding was held yesterday because patient may have aspirated. Family ultimately agrees that tube feeding can be stopped at discharge.    Length of Stay: 3  Current Medications: Scheduled Meds:  . acetaminophen  650 mg Per Tube Q6H   Or  . acetaminophen  650 mg Rectal Q6H  . apixaban  5 mg Per Tube BID  . bethanechol  10 mg Per Tube TID  . chlorhexidine  15 mL Mouth Rinse BID  . Chlorhexidine Gluconate Cloth  6 each Topical Daily  . diclofenac Sodium  2 g Topical TID  . diltiazem  90 mg Per Tube Q8H  . feeding supplement (PROSource TF)  45 mL Per Tube Daily  . free water  100 mL Per Tube Q8H  . insulin aspart  0-9 Units Subcutaneous Q4H  . insulin glargine  20 Units Subcutaneous QHS  . mouth rinse  15 mL Mouth Rinse q12n4p  . methylphenidate  5 mg Per Tube BID  . metoprolol tartrate  50 mg Per Tube BID  . rosuvastatin  20 mg Per Tube Daily  . saccharomyces boulardii  250 mg Per Tube BID  . sennosides  10 mL Per Tube QHS  . zinc oxide   Topical BID    Continuous Infusions: . cefTRIAXone  (ROCEPHIN)  IV 1 g (11/14/20 1100)  . feeding supplement (JEVITY 1.2 CAL) 1,000 mL (11/14/20 1200)    PRN Meds: morphine injection, ondansetron **OR** ondansetron (ZOFRAN) IV, traMADol  Physical Exam Constitutional:      General: She is not in acute distress.    Appearance: She is ill-appearing.  Cardiovascular:     Rate and Rhythm: Normal rate.     Comments: A-fib on monitor Pulmonary:     Effort: Pulmonary effort is normal.  Neurological:     Mental Status: She is alert.     Motor: Weakness present.     Comments: Left hemiplegia  Psychiatric:        Mood and Affect: Affect is tearful.             Vital Signs: BP 121/82 (BP Location: Right Arm)   Pulse 93   Temp 98.3 F (36.8 C) (Oral)   Resp 20   Ht _0  (1.575 m)   Wt 60.6 kg   SpO2 98%   BMI 24.44 kg/m  SpO2: SpO2: 98 % O2 Device: O2 Device: Room Air O2 Flow Rate:    Intake/output summary:   Intake/Output Summary (Last 24 hours) at 11/14/2020 1543 Last data filed at 11/14/2020 0547 Gross per 24 hour  Intake --  Output 550 ml  Net -550 ml   LBM: Last BM Date: 11/12/20 (smear only) Baseline Weight: Weight: 54.9 kg Most recent weight: Weight: 60.6 kg       Palliative Assessment/Data: PPS 30%     Patient Active Problem List   Diagnosis Date Noted  . Atrial fibrillation with RVR ([place]) 11/11/2020  . Acute lower UTI 11/11/2020  . Paroxysmal atrial fibrillation (H[facility])   . Urinary retention   . Dysphagia due to recent cerebrovascular accident   . Acute pyelonephritis 08/17/2020  . C. difficile colitis 08/16/2020  . Palliative care by specialist   . DNR (do not resuscitate) discussion   . Acute respiratory failure with hypoxia ([place])   . Stroke ([place]) 07/26/2020  . Stroke due to  embolism ([place]) 07/26/2020  . Middle cerebral artery embolism, right 07/26/2020  . Sepsis ([place]) 06/29/2020  . DKA (diabetic ketoacidoses) 06/29/2020  . ARF (acute renal failure) ([place]) 06/29/2020  . Hemiparesis affecting right  side as late effect of stroke ([place])   . Acute blood loss anemia   . Left pontine stroke ([place]) 07/13/2018  . Essential hypertension   . Diabetes mellitus type 2 in nonobese (HCC)   . CVA (cerebral vascular accident) ([place]) 07/10/2018  . Hypertensive urgency 07/10/2018  . Hyperglycemia 07/10/2018  . Weakness generalized 07/10/2018  . Hyperlipidemia     Palliative Care Assessment & Plan   HPI/Patient Profile:74 y.o.femalewith past medical history of atrial fibrillation on Eliquis, insulin-dependent diabetes mellitus, hypertension, and recent CVA. [facility] complaint of abdominal pain. ED Course: EKG shows a-fib with rapid rate (140's)- patient started on a diltiazem infusion.Chest x-ray negative for acute findings. CT abdomen/pelvis shows distended bladder with debris or hemorrhage within the bladder and significant volume loss of the left kidney likely reflecting prior infarction. Foley catheter was changed, urinalysis concerning for infection.  Assessment: - septic shock secondary to UTI - paroxysmal A fib with RVR - history of CVA - dysphagia, dependent on PEG tube feeding - aphasia - left sided hemiplegia - anemia of chronic disease  Recommendations/Plan: - code status changed to DNR/DNI - family agrees that patient does not have good quality of life - continue to treat the treatable for now - plan for home with hospice when relatively medically stable  - tube feeds will be stopped prior to discharge - PMT will continue to follow  Family requests [facility] hospice. I spoke with hospice liaison - she plans to speak with family tomorrow using interpreter services. Interpreter is aware.   Goals of Care and Additional Recommendations:  Limitations on Scope of Treatment: Avoid Hospitalization  Code Status: DNR/DNI  Prognosis:   < 2 weeks when tube feeding stopped  Discharge Planning:  Home with  Hospice  Care plan was discussed with Dr. Starla Link, nursing, and hospice liaison  Thank you for allowing the Palliative Medicine Team to assist in the care of this patient.   Total Time 125 minutes Prolonged Time Billed  yes       Greater than 50%  of this time was spent counseling and coordinating care related to the above assessment and plan.  Lavena Bullion, NP  Please contact Palliative Medicine Team phone at [phone] for questions and concerns.

## 2020-11-15 LAB — GLUCOSE, CAPILLARY
Glucose-Capillary: 118 mg/dL — ABNORMAL HIGH (ref 70–99)
Glucose-Capillary: 136 mg/dL — ABNORMAL HIGH (ref 70–99)
Glucose-Capillary: 148 mg/dL — ABNORMAL HIGH (ref 70–99)
Glucose-Capillary: 149 mg/dL — ABNORMAL HIGH (ref 70–99)
Glucose-Capillary: 192 mg/dL — ABNORMAL HIGH (ref 70–99)

## 2020-11-15 LAB — MAGNESIUM: Magnesium: 1.9 mg/dL (ref 1.7–2.4)

## 2020-11-15 NOTE — Progress Notes (Signed)
Pt urine has changed color to bright red mixed with urine, possible concern passed along to night RN and attending MD made aware. Will continue to monitor.

## 2020-11-15 NOTE — Progress Notes (Signed)
Civil engineer, contracting [facility])  Received request from [facility] for hospice services at home after discharge.  Chart and pt information under review by [facility] physician.  Hospice eligibility pending at this time.  Hospital liaison spoke with spouse Ansel Bong to address any new questions or concerns. DME needs discussed.  Hospital bed ordered through [employer] for delivery today, anticipating discharge home tomorrow. Address has been verified and is correct in the chart.  [facility] information and contact numbers given to husband Yyel.  Above information shared with Lorette Ang Manager.  Please call with any questions or concerns.  Thank you for the opportunity to participate in this pt's care.  Gillian Scarce, BSN, RN [employer] [phone] (24h on call)

## 2020-11-15 NOTE — Progress Notes (Signed)
Feeding tube running  at 31ml/hr as ordered, will increase rate to 30ml/hr at 12 midnight as ordered.Patient tolerating tube feeding well at this time, will continue to monitor.

## 2020-11-15 NOTE — Progress Notes (Signed)
Patient ID: Samantha Paul, female   DOB: January 25, 1946, 74 y.o.   MRN: [id number]  PROGRESS NOTE    Samantha Paul  WUJ:[id number] DOB: [birth year] DOA: 11/11/2020 PCP: Rometta Emery, MD   Brief Narrative:  74 y.o. female with medical history significant for atrial fibrillation on Eliquis, insulin-dependent diabetes mellitus, hypertension, and history of unspecified CVA with aphasia, dysphagia status post PEG tube placement, neurogenic bladder status post indwelling catheter, and motor deficits presented from SNF for evaluation of abdominal pain.  On presentation, she was slightly tachypneic, tachycardic in the 140s with stable blood pressure.  Chest x-ray showed no acute infiltrates.  CT of the abdomen and pelvis showed distended bladder with layering debris or hemorrhage within the urinary bladder and significant volume loss of the left kidney likely reflecting a prior infarction.  Foley catheter was changed.  UA was concerning for UTI.  WBCs of 13,500.  She was started on IV fluids, antibiotics and also started on Cardizem drip.  Subsequently cardiology was consulted.  Palliative care was also consulted for goals of care discussion.  Assessment & Plan:   Septic shock: Present on admission probably secondary to UTI UTI, catheter associated -Patient presented with abdominal pain and was found to be tachypneic, tachycardic with leukocytosis, evidence of UTI and initial lactic acid of 4.3. -Currently on Rocephin.  DC antibiotics as patient has received 5 days of antibiotic treatment.  Urine culture is growing Proteus mirabilis.  Off IV fluids -Blood pressure currently stable.  Sepsis has resolved. -Patient has indwelling Foley catheter which was changed in the ED.    Paroxysmal A. fib with RVR -Cardiology signed off on 11/12/2020.  Cardizem drip has been switched to oral Cardizem.  Continue metoprolol and Eliquis.    Leukocytosis -resolved  History of unspecified CVA with residual motor deficits, aphasia,  dysphagia status post PEG tube placement -Patient has history of CVA with left-sided weakness, dysphagia and aphasia and is dependent on PEG feedings.  Continue free water through PEG tube.  Continue Eliquis and statin. -Fall precautions -Tube feeding has been restarted on 11/14/2020 at a lower rate. -Palliative care at family meeting on 11/14/2020 and family has agreed for discharge home with hospice and to discontinue tube feedings upon discharge.  Care management team has been consulted for discharge planning.  Will not do any more blood work or investigations  Mild hyponatremia Probable anemia of chronic disease -Plan as above  Diabetes mellitus type 2 with hyperglycemia -A1c 7.2 in September 2021.  Continue CBGs with SSI.  Continue Lantus   DVT prophylaxis: Eliquis Code Status: Full Family Communication: Husband at bedside along with translator as well on 11/13/2020 Disposition Plan: Status is: Inpatient  Remains inpatient appropriate because:Inpatient level of care appropriate due to severity of illness.  Discharge home with hospice once arrangements have been made   Dispo: The patient is from: SNF              Anticipated d/c is to: Home              Anticipated d/c date is: 1 day              Patient currently is not medically stable to d/c.   Consultants: Palliative care/cardiology  Procedures: None  Antimicrobials:  Anti-infectives (From admission, onward)   Start     Dose/Rate Route Frequency Ordered Stop   11/14/20 1000  cefTRIAXone (ROCEPHIN) 1 g in sodium chloride 0.9 % 100 mL IVPB  1 g 200 mL/hr over 30 Minutes Intravenous Every 24 hours 11/14/20 0911     11/12/20 0600  ceFEPIme (MAXIPIME) 2 g in sodium chloride 0.9 % 100 mL IVPB  Status:  Discontinued        2 g 200 mL/hr over 30 Minutes Intravenous Every 24 hours 11/11/20 0610 11/14/20 0911   11/11/20 0545  ceFEPIme (MAXIPIME) 2 g in sodium chloride 0.9 % 100 mL IVPB        2 g 200 mL/hr over 30  Minutes Intravenous  Once 11/11/20 0544 11/11/20 0704       Subjective: Please seen and examined at bedside.  Poor historian, does not follow commands.  No overnight fever, vomiting reported. Objective: Vitals:   11/15/20 0056 11/15/20 0432 11/15/20 0653 11/15/20 0841  BP: 132/78 (!) 141/95  (!) 143/79  Pulse: 81 89 (!) 50 94  Resp: 19 17 17 18   Temp: 97.9 F (36.6 C) 98.8 F (37.1 C)  99 F (37.2 C)  TempSrc: Oral Oral  Oral  SpO2: 100% 100% 100% 100%  Weight:   63.6 kg   Height:        Intake/Output Summary (Last 24 hours) at 11/15/2020 0906 Last data filed at 11/15/2020 0505 Gross per 24 hour  Intake 1127.67 ml  Output 1375 ml  Net -247.33 ml   Filed Weights   11/12/20 0500 11/14/20 0500 11/15/20 0653  Weight: 57.8 kg 60.6 kg 63.6 kg    Examination:  General exam: No acute distress.  Looks chronically ill and very deconditioned.  Does not follow commands. Respiratory system: Decreased breath sounds at bases bilaterally with scattered crackles cardiovascular system: Intermittently bradycardic, rate controlled  gastrointestinal system: Abdomen is nondistended, soft and mildly tender in the periumbilical region.  Bowel sounds are heard.  PEG tube present.    Data Reviewed: I have personally reviewed following labs and imaging studies  CBC: Recent Labs  Lab 11/11/20 0338 11/11/20 0349 11/12/20 0615 11/13/20 0154 11/14/20 0112  WBC 13.5*  --  5.2 8.2 7.3  NEUTROABS 11.0*  --   --   --   --   HGB 11.6* 13.3 10.3* 10.0* 9.5*  HCT 39.7 39.0 34.1* 33.1* 32.6*  MCV 83.9  --  81.2 81.5 82.5  PLT 269  --  166 184 165   Basic Metabolic Panel: Recent Labs  Lab 11/11/20 0349 11/11/20 1926 11/12/20 0615 11/13/20 0154 11/14/20 0112 11/15/20 0226  NA 133* 133* 134* 135 134*  --   K 4.8 4.8 4.0 4.5 4.6  --   CL 102 100 102 105 103  --   CO2  --  24 23 19* 22  --   GLUCOSE 200* 105* 137* 178* 120*  --   BUN 41* 33* 26* 26* 25*  --   CREATININE 0.70 0.74 0.79  0.85 0.81  --   CALCIUM  --  9.1 8.9 8.4* 8.7*  --   MG  --   --  2.0 1.9 1.9 1.9   GFR: Estimated Creatinine Clearance: 55 mL/min (by C-G formula based on SCr of 0.81 mg/dL). Liver Function Tests: Recent Labs  Lab 11/11/20 1926  AST 33  ALT 22  ALKPHOS 56  BILITOT 0.9  PROT 6.3*  ALBUMIN 2.6*   Recent Labs  Lab 11/11/20 1926  LIPASE 30   No results for input(s): AMMONIA in the last 168 hours. Coagulation Profile: No results for input(s): INR, PROTIME in the last 168 hours. Cardiac Enzymes: No results for  input(s): CKTOTAL, CKMB, CKMBINDEX, TROPONINI in the last 168 hours. BNP (last 3 results) No results for input(s): PROBNP in the last 8760 hours. HbA1C: No results for input(s): HGBA1C in the last 72 hours. CBG: Recent Labs  Lab 11/14/20 1720 11/14/20 2113 11/15/20 0041 11/15/20 0427 11/15/20 0840  GLUCAP 119* 136* 149* 136* 148*   Lipid Profile: No results for input(s): CHOL, HDL, LDLCALC, TRIG, CHOLHDL, LDLDIRECT in the last 72 hours. Thyroid Function Tests: No results for input(s): TSH, T4TOTAL, FREET4, T3FREE, THYROIDAB in the last 72 hours. Anemia Panel: No results for input(s): VITAMINB12, FOLATE, FERRITIN, TIBC, IRON, RETICCTPCT in the last 72 hours. Sepsis Labs: Recent Labs  Lab 11/11/20 0620 11/13/20 0201  LATICACIDVEN 4.3* 1.9    Recent Results (from the past 240 hour(s))  Respiratory Panel by RT PCR (Flu A&B, Covid) - Nasopharyngeal Swab     Status: None   Collection Time: 11/11/20  1:44 PM   Specimen: Nasopharyngeal Swab  Result Value Ref Range Status   SARS Coronavirus 2 by RT PCR NEGATIVE NEGATIVE Final    Comment: (NOTE) SARS-CoV-2 target nucleic acids are NOT DETECTED.  The SARS-CoV-2 RNA is generally detectable in upper respiratoy specimens during the acute phase of infection. The lowest concentration of SARS-CoV-2 viral copies this assay can detect is 131 copies/mL. A negative result does not preclude SARS-Cov-2 infection and  should not be used as the sole basis for treatment or other patient management decisions. A negative result may occur with  improper specimen collection/handling, submission of specimen other than nasopharyngeal swab, presence of viral mutation(s) within the areas targeted by this assay, and inadequate number of viral copies (<131 copies/mL). A negative result must be combined with clinical observations, patient history, and epidemiological information. The expected result is Negative.  Fact Sheet for Patients:  https://www.moore.com/  Fact Sheet for Healthcare Providers:  https://www.young.biz/  This test is no t yet approved or cleared by the [place] FDA and  has been authorized for detection and/or diagnosis of SARS-CoV-2 by FDA under an Emergency Use Authorization (EUA). This EUA will remain  in effect (meaning this test can be used) for the duration of the COVID-19 declaration under Section 564(b)(1) of the Act, 21 U.S.C. section 360bbb-3(b)(1), unless the authorization is terminated or revoked sooner.     Influenza A by PCR NEGATIVE NEGATIVE Final   Influenza B by PCR NEGATIVE NEGATIVE Final    Comment: (NOTE) The Xpert Xpress SARS-CoV-2/FLU/RSV assay is intended as an aid in  the diagnosis of influenza from Nasopharyngeal swab specimens and  should not be used as a sole basis for treatment. Nasal washings and  aspirates are unacceptable for Xpert Xpress SARS-CoV-2/FLU/RSV  testing.  Fact Sheet for Patients: https://www.moore.com/  Fact Sheet for Healthcare Providers: https://www.young.biz/  This test is not yet approved or cleared by the [place] FDA and  has been authorized for detection and/or diagnosis of SARS-CoV-2 by  FDA under an Emergency Use Authorization (EUA). This EUA will remain  in effect (meaning this test can be used) for the duration of the  Covid-19 declaration  under Section 564(b)(1) of the Act, 21  U.S.C. section 360bbb-3(b)(1), unless the authorization is  terminated or revoked. Performed at [facility], [address] [place], [place] [address]   Culture, blood (routine x 2)     Status: None (Preliminary result)   Collection Time: 11/11/20  4:40 PM   Specimen: BLOOD RIGHT HAND  Result Value Ref Range Status  Specimen Description BLOOD RIGHT HAND  Final   Special Requests   Final    BOTTLES DRAWN AEROBIC ONLY Blood Culture results may not be optimal due to an inadequate volume of blood received in culture bottles   Culture   Final    NO GROWTH 3 DAYS Performed at [facility], [address] [place], [place] [address]    Report Status PENDING  Incomplete  Urine culture     Status: Abnormal   Collection Time: 11/11/20  5:46 PM   Specimen: Urine, Random  Result Value Ref Range Status   Specimen Description URINE, RANDOM  Final   Special Requests   Final    NONE Performed at [facility], [address] [place], [place] [address]    Culture >=100,000 COLONIES/mL PROTEUS MIRABILIS (A)  Final   Report Status 11/13/2020 FINAL  Final   Organism ID, Bacteria PROTEUS MIRABILIS (A)  Final      Susceptibility   Proteus mirabilis - MIC*    AMPICILLIN <=2 SENSITIVE Sensitive     CEFAZOLIN <=4 SENSITIVE Sensitive     CEFEPIME <=0.12 SENSITIVE Sensitive     CEFTRIAXONE <=0.25 SENSITIVE Sensitive     CIPROFLOXACIN >=4 RESISTANT Resistant     GENTAMICIN <=1 SENSITIVE Sensitive     IMIPENEM 2 SENSITIVE Sensitive     NITROFURANTOIN 128 RESISTANT Resistant     TRIMETH/SULFA 40 SENSITIVE Sensitive     AMPICILLIN/SULBACTAM <=2 SENSITIVE Sensitive     PIP/TAZO <=4 SENSITIVE Sensitive     * >=100,000 COLONIES/mL PROTEUS MIRABILIS  Culture, blood (routine x 2)     Status: None (Preliminary result)   Collection Time: 11/11/20  7:46 PM   Specimen: BLOOD RIGHT HAND  Result Value Ref Range Status   Specimen Description BLOOD  RIGHT HAND  Final   Special Requests   Final    AEROBIC BOTTLE ONLY Blood Culture results may not be optimal due to an inadequate volume of blood received in culture bottles   Culture   Final    NO GROWTH 3 DAYS Performed at [facility], [address], [place], [place] [address]    Report Status PENDING  Incomplete  MRSA PCR Screening     Status: None   Collection Time: 11/12/20  4:10 AM   Specimen: Nasal Mucosa; Nasopharyngeal  Result Value Ref Range Status   MRSA by PCR NEGATIVE NEGATIVE Final    Comment:        The GeneXpert MRSA Assay (FDA approved for NASAL specimens only), is one component of a comprehensive MRSA colonization surveillance program. It is not intended to diagnose MRSA infection nor to guide or monitor treatment for MRSA infections. Performed at [facility], [address], [place], [place] [address]          Radiology Studies: DG CHEST PORT 1 VIEW  Result Date: 11/13/2020 CLINICAL DATA:  74 year old female with decreased breath sounds. EXAM: PORTABLE CHEST 1 VIEW COMPARISON:  Chest radiograph dated 11/11/2020. FINDINGS: There is diffuse chronic interstitial coarsening and mild bronchitic changes. No focal consolidation, pleural effusion or pneumothorax. Mild cardiomegaly. No acute osseous pathology. IMPRESSION: 1. No acute cardiopulmonary process. 2. Mild cardiomegaly. Electronically Signed   By: Elgie Collard M.D.   On: 11/13/2020 16:44        Scheduled Meds: . acetaminophen  650 mg Per Tube Q6H   Or  . acetaminophen  650 mg Rectal Q6H  . apixaban  5 mg Per  Tube BID  . bethanechol  10 mg Per Tube TID  . chlorhexidine  15 mL Mouth Rinse BID  . Chlorhexidine Gluconate Cloth  6 each Topical Daily  . diclofenac Sodium  2 g Topical TID  . diltiazem  90 mg Per Tube Q8H  . feeding supplement (PROSource TF)  45 mL Per Tube Daily  . free water  100 mL Per Tube Q8H  . insulin aspart  0-9 Units Subcutaneous Q4H  . insulin glargine   20 Units Subcutaneous QHS  . mouth rinse  15 mL Mouth Rinse q12n4p  . methylphenidate  5 mg Per Tube BID  . metoprolol tartrate  50 mg Per Tube BID  . rosuvastatin  20 mg Per Tube Daily  . saccharomyces boulardii  250 mg Per Tube BID  . sennosides  10 mL Per Tube QHS  . zinc oxide   Topical BID   Continuous Infusions: . cefTRIAXone (ROCEPHIN)  IV 1 g (11/14/20 1100)  . feeding supplement (JEVITY 1.2 CAL) 1,000 mL (11/15/20 0047)          Glade Lloyd, MD [facility] 11/15/2020, 9:06 AM

## 2020-11-15 NOTE — TOC Initial Note (Addendum)
Transition of Care [facility]) - Initial/Assessment Note    Patient Details  Name: Samantha Paul MRN: [id number] Date of Birth: 1946-01-09  Transition of Care [facility]) CM/SW Contact:    Gala Lewandowsky, RN Phone Number: 11/15/2020, 9:20 AM  Clinical Narrative: Patient presented for abdominal pain from SNF. Palliative Care is following the patient and the recommendations/plan of care is for home with hospice services when medically stable. NP notes state that family requests [facility]. Case Manager called Chrislyn Author Care Liaison to see if she had received the referral and she had on Saturday. Case Manager called the husband (speaks limited english) montagnard [place] dialect and he verified that the patient will need a hospital bed for the home. Husband states he has to clean the room prior to her arrival and she will need to transport home via ambulance. Case Manager spoke with physician to make him aware of plan of care. Husband wants education regarding medications prior to transition home.              Expected Discharge Plan: Home w Hospice Care Barriers to Discharge: Continued Medical Work up   Patient Goals and CMS Choice Patient states their goals for this hospitalization and ongoing recovery are:: home with hospice services   Choice offered to / list presented to :  (Hospice discussed agencies- family chose [facility]- agency reached out to family on Saturday and they are agreeable to services.)  Expected Discharge Plan and Services Expected Discharge Plan: Home w Hospice Care In-house Referral: NA Discharge Planning Services: CM Consult Post Acute Care Choice: Hospice Living arrangements for the past 2 months: Single Family Home                 DME Arranged: Hospital bed DME Agency[facility] Date DME Agency Contacted: 11/15/20 Time DME Agency Contacted: [phone] Representative spoke with at DME Agency: Gillian Scarce HH Arranged:  RN [facility] Agency: [facility] ([facility]) Date [facility] Agency Contacted: 11/15/20 Time HH Agency Contacted: [phone] Representative spoke with at [facility] Agency: Gillian Scarce  Prior Living Arrangements/Services Living arrangements for the past 2 months: Single Family Home Lives with:: Spouse Patient language and need for interpreter reviewed:: Yes        Need for Family Participation in Patient Care: Yes (Comment) Care giver support system in place?: Yes (comment) Current home services: DME (Pt has RW, tub bench, wc)    Permission Sought/Granted Permission sought to share information with : Family Supports, Case Production designer, theatre/television/film, Oceanographer granted to share information with : Yes, Verbal Permission Granted (from husband)     Permission granted to share info w AGENCY: Manufacturing systems engineer     Admission diagnosis:  Urinary retention [R33.9] Paroxysmal atrial fibrillation (HCC) [I48.0] Atrial fibrillation with RVR (HCC) [I48.91] Patient Active Problem List   Diagnosis Date Noted  . Atrial fibrillation with RVR (HCC) 11/11/2020  . Acute lower UTI 11/11/2020  . Paroxysmal atrial fibrillation (HCC)   . Urinary retention   . Dysphagia due to recent cerebrovascular accident   . Acute pyelonephritis 08/17/2020  . C. difficile colitis 08/16/2020  . Palliative care by specialist   . DNR (do not resuscitate) discussion   . Acute respiratory failure with hypoxia (HCC)   . Stroke (HCC) 07/26/2020  . Stroke due to embolism (HCC) 07/26/2020  . Middle cerebral artery embolism, right 07/26/2020  . Sepsis (HCC) 06/29/2020  . DKA (diabetic ketoacidoses) 06/29/2020  .  ARF (acute renal failure) (HCC) 06/29/2020  . Hemiparesis affecting right side as late effect of stroke (HCC)   . Acute blood loss anemia   . Left pontine stroke (HCC) 07/13/2018  . Essential hypertension   . Diabetes mellitus type 2 in nonobese (HCC)   . CVA (cerebral  vascular accident) (HCC) 07/10/2018  . Hypertensive urgency 07/10/2018  . Hyperglycemia 07/10/2018  . Weakness generalized 07/10/2018  . Hyperlipidemia    PCP:  Rometta Emery, MD Pharmacy:   [facility] [phone] Gin[place] Otto, [place] - [address] AT [facility] [address] [place] [address] Phone: [phone] Fax: [phone]  [facility] [place], [place] - [address] [place] [address] Phone: [phone] Fax: [phone]  Readmission Risk Interventions Readmission Risk Prevention Plan 07/03/2020 07/03/2020  Post Dischage Appt Complete -  Medication Screening Complete Complete  Transportation Screening Complete Complete

## 2020-11-15 NOTE — Plan of Care (Signed)
Progressing, will continue to monitor.  

## 2020-11-16 LAB — GLUCOSE, CAPILLARY
Glucose-Capillary: 154 mg/dL — ABNORMAL HIGH (ref 70–99)
Glucose-Capillary: 164 mg/dL — ABNORMAL HIGH (ref 70–99)
Glucose-Capillary: 171 mg/dL — ABNORMAL HIGH (ref 70–99)
Glucose-Capillary: 217 mg/dL — ABNORMAL HIGH (ref 70–99)
Glucose-Capillary: 241 mg/dL — ABNORMAL HIGH (ref 70–99)

## 2020-11-16 LAB — CULTURE, BLOOD (ROUTINE X 2)
Culture: NO GROWTH
Culture: NO GROWTH

## 2020-11-16 MED ORDER — DILTIAZEM HCL 60 MG PO TABS: 60.0000 mg | ORAL_TABLET | Freq: Three times a day (TID) | ORAL | 0 refills | Status: AC

## 2020-11-16 MED ORDER — FREE WATER: 100.0000 mL | Freq: Three times a day (TID) | 1 refills | Status: AC

## 2020-11-16 MED ORDER — ONDANSETRON HCL 4 MG PO TABS: 4.0000 mg | ORAL_TABLET | Freq: Four times a day (QID) | ORAL | 0 refills | Status: AC | PRN

## 2020-11-16 MED ORDER — TRAMADOL HCL 50 MG PO TABS: 50.0000 mg | ORAL_TABLET | Freq: Four times a day (QID) | ORAL | 0 refills | Status: AC | PRN

## 2020-11-16 MED ORDER — SENNOSIDES 8.8 MG/5ML PO SYRP: 10.0000 mL | ORAL_SOLUTION | Freq: Every day | ORAL | 0 refills | Status: AC

## 2020-11-16 MED ORDER — BETHANECHOL CHLORIDE 10 MG PO TABS: 10.0000 mg | ORAL_TABLET | Freq: Three times a day (TID) | ORAL | 0 refills | Status: AC

## 2020-11-16 MED ORDER — ZINC OXIDE 20 % EX OINT: TOPICAL_OINTMENT | Freq: Two times a day (BID) | CUTANEOUS | 0 refills | Status: AC

## 2020-11-16 MED ORDER — MORPHINE SULFATE 10 MG/5ML PO SOLN: 5.0000 mg | ORAL | 0 refills | Status: AC | PRN

## 2020-11-16 MED ORDER — METOPROLOL TARTRATE 50 MG PO TABS: 50.0000 mg | ORAL_TABLET | Freq: Two times a day (BID) | ORAL | 0 refills | Status: AC

## 2020-11-16 NOTE — Progress Notes (Signed)
Civil engineer, contracting (ACC)  This RN coordinated Peggye Ley translator, with Amil Amen, NP, and with [facility] admission and referral team to try to anticipate pt needs after discharge. Plan made for hospital bed to be delivered today. Pt's husband reports being able to receive hospital bed today.  Pt will be discharged tomorrow morning, arriving home hopefully in time for a 2:30pm admission visit with Luberta Robertson, translator, and [facility] admission RN.  ACC information and contact numbers given to [facility].  Above information shared with Ruel Favors, [facility] Manager.  Please call with any questions or concerns.  Thank you for the opportunity to participate in this pt's care.  Gillian Scarce, BSN, RN [employer] [phone] (24h on call)

## 2020-11-16 NOTE — TOC Transition Note (Signed)
Transition of Care (TOC) - CM/SW Discharge Note Donn Pierini RN, BSN Transitions of Care Unit 4E- RN Case Manager See Treatment Team for direct phone #    Patient Details  Name: Severina Sykora MRN: [id number] Date of Birth: 06-Dec-1946  Transition of Care [facility]) CM/SW Contact:  Darrold Span, RN Phone Number: 11/16/2020, 1:08 PM   Clinical Narrative:    Pt stable for transition home with Hospice today, have spoken with Johny Shears at [facility] - pt has been accepted into Hospice and they are prepared for pt's transition home today- DME was ordered yesterday however spouse declined delivery last evening and they are working on having hospital bed delivered today (as soon as possible)- transport is pending on bed delivery. GOLD DNR has been signed and is on shadow chart.  CM will await word from [facility] regarding dme delivery and arrange PTAR transport when DME has been confirmed in the home.    Final next level of care: Home w Hospice Care Barriers to Discharge: Equipment Delay   Patient Goals and CMS Choice Patient states their goals for this hospitalization and ongoing recovery are:: home with hospice services CMS Medicare.gov Compare Post Acute Care list provided to:: Patient Choice offered to / list presented to :  (Hospice discussed agencies- family chose [facility]- agency reached out to family on Saturday and they are agreeable to services.)  Discharge Placement               Home with Hospice        Discharge Plan and Services In-house Referral: NA Discharge Planning Services: CM Consult Post Acute Care Choice: Hospice          DME Arranged: Hospital bed DME Agency: [facility] Date DME Agency Contacted: 11/15/20 Time DME Agency Contacted: [phone] Representative spoke with at DME Agency: Gillian Scarce HH Arranged: RN [facility] Agency: [facility] ([facility]) Date [facility] Agency Contacted:  11/15/20 Time HH Agency Contacted: [phone] Representative spoke with at [facility] Agency: Chrislyn King  Social Determinants of Health (SDOH) Interventions     Readmission Risk Interventions Readmission Risk Prevention Plan 11/16/2020 07/03/2020 07/03/2020  Post Dischage Appt - Complete -  Medication Screening - Complete Complete  Transportation Screening Complete Complete Complete  Medication Review (RN Care Manager) Complete - -  PCP or Specialist appointment within 3-5 days of discharge Complete - -  HRI or Home Care Consult Complete - -  SW Recovery Care/Counseling Consult Complete - -  Palliative Care Screening Complete - -  Skilled Nursing Facility Not Applicable - -

## 2020-11-16 NOTE — Progress Notes (Signed)
Pt urine tea colored for the rest of the night. No sediment.

## 2020-11-16 NOTE — Progress Notes (Signed)
Check V.S. pre cardiac meds. Mews is Yellow. Did not do protocol due to comfort care and Vital signs were D.C. today. R.N. aware.

## 2020-11-16 NOTE — Discharge Summary (Signed)
Physician Discharge Summary  Samantha Paul NWG:[id number] DOB: 1946/01/05 DOA: 11/11/2020  PCP: Elwyn Reach, MD  Admit date: 11/11/2020 Discharge date: 11/16/2020  Admitted From: SNF Disposition: Home with hospice  Recommendations for Outpatient Follow-up:  1. Follow up with hospice provider at earliest convenience 2. DC tube feeding on discharge as per family's wishes   Home Health: Home hospice Equipment/Devices: None  Discharge Condition: Poor CODE STATUS: DNR  Diet recommendation: Family has decided to DC tube feedings and discharge.  Patient unable to swallow because of stroke  Brief/Interim Summary: 74 y.o.femalewith medical history significant foratrial fibrillation on Eliquis, insulin-dependent diabetes mellitus, hypertension, and history of unspecified CVA with aphasia, dysphagia status post PEG tube placement, neurogenic bladder status post indwelling catheter,and motor deficits presented from SNF for evaluation of abdominal pain.  On presentation, she was slightly tachypneic, tachycardic in the 140s with stable blood pressure.  Chest x-ray showed no acute infiltrates.  CT of the abdomen and pelvis showed distended bladder with layering debris or hemorrhage within the urinary bladder and significant volume loss of the left kidney likely reflecting a prior infarction. Foley catheter was changed.  UA was concerning for UTI.  WBCs of 13,500.  She was started on IV fluids, antibiotics and also started on Cardizem drip.  Subsequently cardiology was consulted.  Palliative care was also consulted for goals of care discussion.  After discussion with palliative care team, family has decided to proceed with with home hospice and DC tube feedings upon discharge.  Patient will be discharged to hospice once arrangements have been made.  Will DC most of the nonessential medications and focus more on quality of life and comfort.  Discharge Diagnoses:   Septic shock: Present on admission  probably secondary to UTI UTI, catheter associated -Patient presented with abdominal pain and was found to be tachypneic, tachycardic with leukocytosis, evidence of UTI and initial lactic acid of 4.3. -Treated with 5 days of IV Rocephin and subsequently discontinued.  Urine culture is growing Proteus mirabilis.  Off IV fluids -Blood pressure currently stable.  Sepsis has resolved. -Patient has indwelling Foley catheter which was changed in the ED.    Paroxysmal A. fib with RVR -Cardiology signed off on 11/12/2020.  Cardizem drip has been switched to oral Cardizem.    Continue metoprolol and Cardizem upon discharge as this may help with rate control and prevent palpitations which might cause discomfort to the patient.  Will DC Eliquis as patient has been having hematuria during the hospitalization.   Leukocytosis -resolved  History of unspecified CVA with residual motor deficits, aphasia, dysphagia status post PEG tube placement -Patient has history of CVA with left-sided weakness, dysphagia and aphasia and is dependent on PEG feedings.   -Palliative care had family meeting on 11/14/2020 and family has agreed for discharge home with hospice and to discontinue tube feedings upon discharge. -Subsequently no more blood works or investigations have been done. - Care management team has been consulted for discharge planning.    Discharge home with hospice once arrangements have been made.  Mild hyponatremia Probable anemia of chronic disease -Recommend no more blood work upon discharge.  Diabetes mellitus type 2 with hyperglycemia -Will not discharge on Metformin or Lantus as patient will be n.p.o. and we are focusing on comfort measures/hospice.  Discharge Instructions  Discharge Instructions    Discharge wound care:   Complete by: As directed    Apply topical zinc oxide twice daily to affected sacral area   Increase activity slowly  Complete by: As directed      Allergies as of  11/16/2020      Reactions   Metformin And Related Diarrhea   Abdominal pain and diarrhea      Medication List    STOP taking these medications   apixaban 5 MG Tabs tablet Commonly known as: ELIQUIS   blood glucose meter kit and supplies Kit   feeding supplement (JEVITY 1.2 CAL) Liqd   ferrous sulfate 300 (60 Fe) MG/5ML syrup   Florastor 250 MG capsule Generic drug: saccharomyces boulardii   gabapentin 100 MG capsule Commonly known as: NEURONTIN   insulin aspart 100 UNIT/ML injection Commonly known as: novoLOG   insulin glargine 100 UNIT/ML injection Commonly known as: LANTUS   Lantus SoloStar 100 UNIT/ML Solostar Pen Generic drug: insulin glargine   metFORMIN 500 MG tablet Commonly known as: Glucophage   methylphenidate 5 MG tablet Commonly known as: RITALIN   Pen Needles 32G X 4 MM Misc   PROSTAT PO   rosuvastatin 20 MG tablet Commonly known as: CRESTOR   senna 8.6 MG tablet Commonly known as: SENOKOT Replaced by: sennosides 8.8 MG/5ML syrup   Zinc Oxide 16 % Oint Replaced by: zinc oxide 20 % ointment     TAKE these medications   acetaminophen 325 MG tablet Commonly known as: TYLENOL Place 650 mg into feeding tube every 8 (eight) hours as needed for moderate pain. What changed: Another medication with the same name was removed. Continue taking this medication, and follow the directions you see here.   bethanechol 10 MG tablet Commonly known as: URECHOLINE Place 1 tablet (10 mg total) into feeding tube 3 (three) times daily.   diclofenac Sodium 1 % Gel Commonly known as: VOLTAREN Apply 2 g topically in the morning, at noon, and at bedtime.   diltiazem 60 MG tablet Commonly known as: CARDIZEM Place 1 tablet (60 mg total) into feeding tube 3 (three) times daily. What changed: when to take this   free water Soln Place 100 mLs into feeding tube every 8 (eight) hours.   metoprolol tartrate 50 MG tablet Commonly known as: LOPRESSOR Place 1  tablet (50 mg total) into feeding tube 2 (two) times daily.   morphine 10 MG/5ML solution Take 2.5 mLs (5 mg total) by mouth every 2 (two) hours as needed for severe pain (dyspnea).   ondansetron 4 MG tablet Commonly known as: ZOFRAN Place 1 tablet (4 mg total) into feeding tube every 6 (six) hours as needed for nausea.   sennosides 8.8 MG/5ML syrup Commonly known as: SENOKOT Place 10 mLs into feeding tube at bedtime. Replaces: senna 8.6 MG tablet   traMADol 50 MG tablet Commonly known as: ULTRAM Place 1 tablet (50 mg total) into feeding tube every 6 (six) hours as needed. What changed:   when to take this  reasons to take this   zinc oxide 20 % ointment Apply topically 2 (two) times daily. Apply topically to affected sacral area Replaces: Zinc Oxide 16 % Oint            Discharge Care Instructions  (From admission, onward)         Start     Ordered   11/16/20 0000  Discharge wound care:       Comments: Apply topical zinc oxide twice daily to affected sacral area   11/16/20 1033          Follow-up Information    Home hospice Follow up.   Why: At earliest convenience  Allergies  Allergen Reactions  . Metformin And Related Diarrhea    Abdominal pain and diarrhea    Consultations:  Cardiology/palliative care   Procedures/Studies: CT ABDOMEN PELVIS W CONTRAST  Result Date: 11/11/2020 CLINICAL DATA:  Nonlocalized abdominal pain EXAM: CT ABDOMEN AND PELVIS WITH CONTRAST TECHNIQUE: Multidetector CT imaging of the abdomen and pelvis was performed using the standard protocol following bolus administration of intravenous contrast. CONTRAST:  140m OMNIPAQUE IOHEXOL 300 MG/ML  SOLN COMPARISON:  08/14/2020 FINDINGS: Lower chest:  No contributory findings. Hepatobiliary: No focal liver abnormality.Cholecystectomy. No bile duct dilatation. Pancreas: Unremarkable. Spleen: Unremarkable. Adrenals/Urinary Tract: Negative adrenals. Significant decrease in  size of the left kidney with no enhancement of the upper and interpolar region. Right renal cysts. Normal cortical enhancement on the right. No hydronephrosis. Distended bladder with dependent high-density material. A Foley catheter is in appropriate position. Stomach/Bowel: No obstruction. No appendicitis. Percutaneous gastrostomy tube in expected position Vascular/Lymphatic: No acute vascular abnormality. Mild atheromatous plaque. No mass or adenopathy. Reproductive:No pathologic findings. Other: No ascites or pneumoperitoneum. Musculoskeletal: No acute abnormalities. Lumbar spine degeneration with mild L4-5 anterolisthesis and L1-2 retrolisthesis. IMPRESSION: 1. Distended bladder despite a Foley catheter, please correlate with catheter function. 2. Layering debris or hemorrhage within the bladder lumen. 3. Significant volume loss of the left kidney since prior with primarily nonenhancing parenchyma. Prior findings were likely related to infarct rather than infection. Electronically Signed   By: JMonte FantasiaM.D.   On: 11/11/2020 04:59   DG CHEST PORT 1 VIEW  Result Date: 11/13/2020 CLINICAL DATA:  74year old female with decreased breath sounds. EXAM: PORTABLE CHEST 1 VIEW COMPARISON:  Chest radiograph dated 11/11/2020. FINDINGS: There is diffuse chronic interstitial coarsening and mild bronchitic changes. No focal consolidation, pleural effusion or pneumothorax. Mild cardiomegaly. No acute osseous pathology. IMPRESSION: 1. No acute cardiopulmonary process. 2. Mild cardiomegaly. Electronically Signed   By: AAnner CreteM.D.   On: 11/13/2020 16:44   DG Chest Portable 1 View  Result Date: 11/11/2020 CLINICAL DATA:  Atrial fibrillation EXAM: PORTABLE CHEST 1 VIEW COMPARISON:  09/18/2020 FINDINGS: Lungs are clear. Mild cardiomegaly is stable. No pneumothorax or pleural effusion. Pulmonary vascularity is normal. No acute bone abnormality. IMPRESSION: No active disease.  Stable cardiomegaly.  Electronically Signed   By: AFidela SalisburyMD   On: 11/11/2020 04:21       Subjective: Patient seen and examined at bedside.  Poor historian, does not follow any commands.  No overnight fever or vomiting reported.  Discharge Exam: Vitals:   11/16/20 0430 11/16/20 0805  BP: (!) 153/87 130/64  Pulse: 91 72  Resp: 17 20  Temp: 98.3 F (36.8 C) 97.8 F (36.6 C)  SpO2: 100% 97%    General: Chronically ill looking elderly female who looks very deconditioned and does not follow any commands.   Cardiovascular: rate controlled, S1/S2 + Respiratory: bilateral decreased breath sounds at bases with scattered crackles Abdominal: Soft, NT, ND, bowel sounds +, PEG tube present Extremities: Trace lower extremity edema; no cyanosis    The results of significant diagnostics from this hospitalization (including imaging, microbiology, ancillary and laboratory) are listed below for reference.     Microbiology: Recent Results (from the past 240 hour(s))  Respiratory Panel by RT PCR (Flu A&B, Covid) - Nasopharyngeal Swab     Status: None   Collection Time: 11/11/20  1:44 PM   Specimen: Nasopharyngeal Swab  Result Value Ref Range Status   SARS Coronavirus 2 by RT PCR NEGATIVE NEGATIVE Final  Comment: (NOTE) SARS-CoV-2 target nucleic acids are NOT DETECTED.  The SARS-CoV-2 RNA is generally detectable in upper respiratoy specimens during the acute phase of infection. The lowest concentration of SARS-CoV-2 viral copies this assay can detect is 131 copies/mL. A negative result does not preclude SARS-Cov-2 infection and should not be used as the sole basis for treatment or other patient management decisions. A negative result may occur with  improper specimen collection/handling, submission of specimen other than nasopharyngeal swab, presence of viral mutation(s) within the areas targeted by this assay, and inadequate number of viral copies (<131 copies/mL). A negative result must be combined  with clinical observations, patient history, and epidemiological information. The expected result is Negative.  Fact Sheet for Patients:  PinkCheek.be  Fact Sheet for Healthcare Providers:  GravelBags.it  This test is no t yet approved or cleared by the [place] FDA and  has been authorized for detection and/or diagnosis of SARS-CoV-2 by FDA under an Emergency Use Authorization (EUA). This EUA will remain  in effect (meaning this test can be used) for the duration of the COVID-19 declaration under Section 564(b)(1) of the Act, 21 U.S.C. section 360bbb-3(b)(1), unless the authorization is terminated or revoked sooner.     Influenza A by PCR NEGATIVE NEGATIVE Final   Influenza B by PCR NEGATIVE NEGATIVE Final    Comment: (NOTE) The Xpert Xpress SARS-CoV-2/FLU/RSV assay is intended as an aid in  the diagnosis of influenza from Nasopharyngeal swab specimens and  should not be used as a sole basis for treatment. Nasal washings and  aspirates are unacceptable for Xpert Xpress SARS-CoV-2/FLU/RSV  testing.  Fact Sheet for Patients: PinkCheek.be  Fact Sheet for Healthcare Providers: GravelBags.it  This test is not yet approved or cleared by the [place] FDA and  has been authorized for detection and/or diagnosis of SARS-CoV-2 by  FDA under an Emergency Use Authorization (EUA). This EUA will remain  in effect (meaning this test can be used) for the duration of the  Covid-19 declaration under Section 564(b)(1) of the Act, 21  U.S.C. section 360bbb-3(b)(1), unless the authorization is  terminated or revoked. Performed at [facility], [place] [address], [place], [place] [address]   Culture, blood (routine x 2)     Status: None   Collection Time: 11/11/20  4:40 PM   Specimen: BLOOD RIGHT HAND  Result Value Ref Range Status   Specimen Description BLOOD RIGHT  HAND  Final   Special Requests   Final    BOTTLES DRAWN AEROBIC ONLY Blood Culture results may not be optimal due to an inadequate volume of blood received in culture bottles   Culture   Final    NO GROWTH 5 DAYS Performed at [facility], [place] [address] [place], [place] [address]    Report Status 11/16/2020 FINAL  Final  Urine culture     Status: Abnormal   Collection Time: 11/11/20  5:46 PM   Specimen: Urine, Random  Result Value Ref Range Status   Specimen Description URINE, RANDOM  Final   Special Requests   Final    NONE Performed at [facility], [place] [address], [place], [place] [address]    Culture >=100,000 COLONIES/mL PROTEUS MIRABILIS (A)  Final   Report Status 11/13/2020 FINAL  Final   Organism ID, Bacteria PROTEUS MIRABILIS (A)  Final      Susceptibility   Proteus mirabilis - MIC*    AMPICILLIN <=2 SENSITIVE Sensitive     CEFAZOLIN <=  4 SENSITIVE Sensitive     CEFEPIME <=0.12 SENSITIVE Sensitive     CEFTRIAXONE <=0.25 SENSITIVE Sensitive     CIPROFLOXACIN >=4 RESISTANT Resistant     GENTAMICIN <=1 SENSITIVE Sensitive     IMIPENEM 2 SENSITIVE Sensitive     NITROFURANTOIN 128 RESISTANT Resistant     TRIMETH/SULFA 40 SENSITIVE Sensitive     AMPICILLIN/SULBACTAM <=2 SENSITIVE Sensitive     PIP/TAZO <=4 SENSITIVE Sensitive     * >=100,000 COLONIES/mL PROTEUS MIRABILIS  Culture, blood (routine x 2)     Status: None   Collection Time: 11/11/20  7:46 PM   Specimen: BLOOD RIGHT HAND  Result Value Ref Range Status   Specimen Description BLOOD RIGHT HAND  Final   Special Requests   Final    AEROBIC BOTTLE ONLY Blood Culture results may not be optimal due to an inadequate volume of blood received in culture bottles   Culture   Final    NO GROWTH 5 DAYS Performed at [facility], [place] [address], [place], [place] [address]    Report Status 11/16/2020 FINAL  Final  MRSA PCR Screening     Status: None   Collection Time: 11/12/20  4:10 AM   Specimen: Nasal  Mucosa; Nasopharyngeal  Result Value Ref Range Status   MRSA by PCR NEGATIVE NEGATIVE Final    Comment:        The GeneXpert MRSA Assay (FDA approved for NASAL specimens only), is one component of a comprehensive MRSA colonization surveillance program. It is not intended to diagnose MRSA infection nor to guide or monitor treatment for MRSA infections. Performed at [facility], [place] [address] [place], [place] [address]      Labs: BNP (last 3 results) No results for input(s): BNP in the last 8760 hours. Basic Metabolic Panel: Recent Labs  Lab 11/11/20 0349 11/11/20 1926 11/12/20 0615 11/13/20 0154 11/14/20 0112 11/15/20 0226  NA 133* 133* 134* 135 134*  --   K 4.8 4.8 4.0 4.5 4.6  --   CL 102 100 102 105 103  --   CO2  --  24 23 19* 22  --   GLUCOSE 200* 105* 137* 178* 120*  --   BUN 41* 33* 26* 26* 25*  --   CREATININE 0.70 0.74 0.79 0.85 0.81  --   CALCIUM  --  9.1 8.9 8.4* 8.7*  --   MG  --   --  2.0 1.9 1.9 1.9   Liver Function Tests: Recent Labs  Lab 11/11/20 1926  AST 33  ALT 22  ALKPHOS 56  BILITOT 0.9  PROT 6.3*  ALBUMIN 2.6*   Recent Labs  Lab 11/11/20 1926  LIPASE 30   No results for input(s): AMMONIA in the last 168 hours. CBC: Recent Labs  Lab 11/11/20 0338 11/11/20 0349 11/12/20 0615 11/13/20 0154 11/14/20 0112  WBC 13.5*  --  5.2 8.2 7.3  NEUTROABS 11.0*  --   --   --   --   HGB 11.6* 13.3 10.3* 10.0* 9.5*  HCT 39.7 39.0 34.1* 33.1* 32.6*  MCV 83.9  --  81.2 81.5 82.5  PLT 269  --  166 184 165   Cardiac Enzymes: No results for input(s): CKTOTAL, CKMB, CKMBINDEX, TROPONINI in the last 168 hours. BNP: Invalid input(s): POCBNP CBG: Recent Labs  Lab 11/15/20 1219 11/15/20 2027 11/16/20 0024 11/16/20 0429 11/16/20 0802  GLUCAP 118* 192* 241* 217* 171*   D-Dimer No results for input(s): DDIMER  in the last 72 hours. Hgb A1c No results for input(s): HGBA1C in the last 72 hours. Lipid Profile No results for input(s):  CHOL, HDL, LDLCALC, TRIG, CHOLHDL, LDLDIRECT in the last 72 hours. Thyroid function studies No results for input(s): TSH, T4TOTAL, T3FREE, THYROIDAB in the last 72 hours.  Invalid input(s): FREET3 Anemia work up No results for input(s): VITAMINB12, FOLATE, FERRITIN, TIBC, IRON, RETICCTPCT in the last 72 hours. Urinalysis    Component Value Date/Time   COLORURINE YELLOW 11/11/2020 0529   APPEARANCEUR CLOUDY (A) 11/11/2020 0529   LABSPEC 1.014 11/11/2020 0529   PHURINE 9.0 (H) 11/11/2020 0529   GLUCOSEU NEGATIVE 11/11/2020 0529   HGBUR NEGATIVE 11/11/2020 0529   BILIRUBINUR NEGATIVE 11/11/2020 0529   KETONESUR NEGATIVE 11/11/2020 0529   PROTEINUR 100 (A) 11/11/2020 0529   NITRITE POSITIVE (A) 11/11/2020 0529   LEUKOCYTESUR LARGE (A) 11/11/2020 0529   Sepsis Labs Invalid input(s): PROCALCITONIN,  WBC,  LACTICIDVEN Microbiology Recent Results (from the past 240 hour(s))  Respiratory Panel by RT PCR (Flu A&B, Covid) - Nasopharyngeal Swab     Status: None   Collection Time: 11/11/20  1:44 PM   Specimen: Nasopharyngeal Swab  Result Value Ref Range Status   SARS Coronavirus 2 by RT PCR NEGATIVE NEGATIVE Final    Comment: (NOTE) SARS-CoV-2 target nucleic acids are NOT DETECTED.  The SARS-CoV-2 RNA is generally detectable in upper respiratoy specimens during the acute phase of infection. The lowest concentration of SARS-CoV-2 viral copies this assay can detect is 131 copies/mL. A negative result does not preclude SARS-Cov-2 infection and should not be used as the sole basis for treatment or other patient management decisions. A negative result may occur with  improper specimen collection/handling, submission of specimen other than nasopharyngeal swab, presence of viral mutation(s) within the areas targeted by this assay, and inadequate number of viral copies (<131 copies/mL). A negative result must be combined with clinical observations, patient history, and epidemiological  information. The expected result is Negative.  Fact Sheet for Patients:  PinkCheek.be  Fact Sheet for Healthcare Providers:  GravelBags.it  This test is no t yet approved or cleared by the [place] FDA and  has been authorized for detection and/or diagnosis of SARS-CoV-2 by FDA under an Emergency Use Authorization (EUA). This EUA will remain  in effect (meaning this test can be used) for the duration of the COVID-19 declaration under Section 564(b)(1) of the Act, 21 U.S.C. section 360bbb-3(b)(1), unless the authorization is terminated or revoked sooner.     Influenza A by PCR NEGATIVE NEGATIVE Final   Influenza B by PCR NEGATIVE NEGATIVE Final    Comment: (NOTE) The Xpert Xpress SARS-CoV-2/FLU/RSV assay is intended as an aid in  the diagnosis of influenza from Nasopharyngeal swab specimens and  should not be used as a sole basis for treatment. Nasal washings and  aspirates are unacceptable for Xpert Xpress SARS-CoV-2/FLU/RSV  testing.  Fact Sheet for Patients: PinkCheek.be  Fact Sheet for Healthcare Providers: GravelBags.it  This test is not yet approved or cleared by the [place] FDA and  has been authorized for detection and/or diagnosis of SARS-CoV-2 by  FDA under an Emergency Use Authorization (EUA). This EUA will remain  in effect (meaning this test can be used) for the duration of the  Covid-19 declaration under Section 564(b)(1) of the Act, 21  U.S.C. section 360bbb-3(b)(1), unless the authorization is  terminated or revoked. Performed at [facility], [place] [address] [place], [place] [address]  Culture, blood (routine x 2)     Status: None   Collection Time: 11/11/20  4:40 PM   Specimen: BLOOD RIGHT HAND  Result Value Ref Range Status   Specimen Description BLOOD RIGHT HAND  Final   Special Requests   Final    BOTTLES DRAWN AEROBIC  ONLY Blood Culture results may not be optimal due to an inadequate volume of blood received in culture bottles   Culture   Final    NO GROWTH 5 DAYS Performed at [facility], [place] [address] [place], [place] [address]    Report Status 11/16/2020 FINAL  Final  Urine culture     Status: Abnormal   Collection Time: 11/11/20  5:46 PM   Specimen: Urine, Random  Result Value Ref Range Status   Specimen Description URINE, RANDOM  Final   Special Requests   Final    NONE Performed at [facility], [place] [address], [place], [place] [address]    Culture >=100,000 COLONIES/mL PROTEUS MIRABILIS (A)  Final   Report Status 11/13/2020 FINAL  Final   Organism ID, Bacteria PROTEUS MIRABILIS (A)  Final      Susceptibility   Proteus mirabilis - MIC*    AMPICILLIN <=2 SENSITIVE Sensitive     CEFAZOLIN <=4 SENSITIVE Sensitive     CEFEPIME <=0.12 SENSITIVE Sensitive     CEFTRIAXONE <=0.25 SENSITIVE Sensitive     CIPROFLOXACIN >=4 RESISTANT Resistant     GENTAMICIN <=1 SENSITIVE Sensitive     IMIPENEM 2 SENSITIVE Sensitive     NITROFURANTOIN 128 RESISTANT Resistant     TRIMETH/SULFA 40 SENSITIVE Sensitive     AMPICILLIN/SULBACTAM <=2 SENSITIVE Sensitive     PIP/TAZO <=4 SENSITIVE Sensitive     * >=100,000 COLONIES/mL PROTEUS MIRABILIS  Culture, blood (routine x 2)     Status: None   Collection Time: 11/11/20  7:46 PM   Specimen: BLOOD RIGHT HAND  Result Value Ref Range Status   Specimen Description BLOOD RIGHT HAND  Final   Special Requests   Final    AEROBIC BOTTLE ONLY Blood Culture results may not be optimal due to an inadequate volume of blood received in culture bottles   Culture   Final    NO GROWTH 5 DAYS Performed at [facility], [place] [address], [place], [place] [address]    Report Status 11/16/2020 FINAL  Final  MRSA PCR Screening     Status: None   Collection Time: 11/12/20  4:10 AM   Specimen: Nasal Mucosa; Nasopharyngeal  Result Value Ref Range Status   MRSA by  PCR NEGATIVE NEGATIVE Final    Comment:        The GeneXpert MRSA Assay (FDA approved for NASAL specimens only), is one component of a comprehensive MRSA colonization surveillance program. It is not intended to diagnose MRSA infection nor to guide or monitor treatment for MRSA infections. Performed at [facility], [place] [address] [place], [place] [address]      Time coordinating discharge: 35 minutes  SIGNED:   Aline August, MD  [facility] 11/16/2020, 10:34 AM

## 2020-11-16 NOTE — Care Management Important Message (Signed)
Important Message  Patient Details  Name: Samantha Paul MRN: [id number] Date of Birth: Jan 11, 1946   Medicare Important Message Given:  Yes     Renie Ora 11/16/2020, 11:05 AM

## 2020-11-17 MED ORDER — JEVITY 1.2 CAL PO LIQD: 20.0000 mL/h | ORAL | 0 refills | Status: AC

## 2020-11-17 MED ORDER — JEVITY 1.2 CAL PO LIQD
1000.0000 mL | ORAL | Status: DC
  Administered 2020-11-17: 1000 mL
  Filled 2020-11-17 (×2): qty 1000

## 2020-11-17 NOTE — Progress Notes (Signed)
Patient husband in room and asking questions. Wants to know why wife is not being feed." I do not want her to die."," I Love her."" I want to keep her a long time."  "I will take care of her as long as I can".. Husband will speak with Doctor this a.m. about home care." I will cry if she goes. " Patient husband stated.

## 2020-11-17 NOTE — Progress Notes (Addendum)
Patient ID: Samantha Paul, female   DOB: 07/09/1946, 74 y.o.   MRN: [id number] Patient is waiting to be discharged home with hospice once arrangements have been made.  Tube feeding was stopped yesterday expecting discharge to home as family wanted tube feeding stopped at home on discharge.  Overall prognosis is very poor.  Please refer to the full discharge summary done by me on 11/16/2020 for full details.  Addendum: Husband at requesting that tube feeding be restarted.  Palliative care team has spoken to the husband again and tube feeding will be started at a lower rate at 20 cc an hour without up titration with hopes that hospice team can speak to husband/family again once the patient gets discharged regarding the same and possible discontinuation.  Care management team has also been made aware and they are trying to reach with home hospice to see if tube feedings can be managed by home hospice team.  Once this is arranged, patient will be discharged home.

## 2020-11-17 NOTE — Progress Notes (Signed)
PT Cancellation Note  Patient Details Name: Samantha Paul MRN: [id number] DOB: [birth year]   Cancelled Treatment:    Reason Eval/Treat Not Completed: (P) Other (comment) (Pt to d/c home with hospice, will defer PT this session.)   Florestine Avers 11/17/2020, 10:17 AM  Bonney Leitz , PTA Acute Rehabilitation Services Pager [phone] Office [phone]

## 2020-11-17 NOTE — Progress Notes (Signed)
Daily Progress Note   Patient Name: Samantha Paul       Date: 11/17/2020 DOB: 1946/06/12  Age: 74 y.o. MRN#: [id number] Attending Physician: Glade Lloyd, MD Primary Care Physician: Rometta Emery, MD Admit Date: 11/11/2020  Reason for Follow-up: continued GOC discussion  Subjective: Husband is at bedside and has questions for PMT regarding plan of care.  He has brought an interpreter - a different person than has interpreted previous discussions.   He is asking about tube feeds - he wants to be shown how to manage the tube feedings at home.  I review our meeting from 11/20, where it was discussed that tube feeding is a life-prolonging measure. Reviewed that patient had expressed in the past to her husband that she did not want to be "kept alive" artificially or with machines. Discussed again that she is unable to walk, communicate, or swallow - which is not consistent with good quality of life for most patients.  Husband states he understands all of this, but is not ready to "let her go". He feels that by stopping the tube feeding, he is "killing her".  I gently provided education that if tube feedings were stopped, her death would be from the condition that that made her unable to swallow (the stroke), not from the removal of artificial feeding.   Husband is adamant that tube feedings are continued at home and I ultimately agreed that we would honor his request. I did re-iterate that I did not think continuing tube feeds was consistent with the patient's wishes.   Length of Stay: 6  Current Medications: Scheduled Meds:  . acetaminophen  650 mg Per Tube Q6H   Or  . acetaminophen  650 mg Rectal Q6H  . bethanechol  10 mg Per Tube TID  . chlorhexidine  15 mL Mouth Rinse BID  .  Chlorhexidine Gluconate Cloth  6 each Topical Daily  . diclofenac Sodium  2 g Topical TID  . diltiazem  90 mg Per Tube Q8H  . mouth rinse  15 mL Mouth Rinse q12n4p  . metoprolol tartrate  50 mg Per Tube BID  . sennosides  10 mL Per Tube QHS  . zinc oxide   Topical BID    Continuous Infusions:   PRN Meds: morphine injection, ondansetron **OR** ondansetron (ZOFRAN) IV, traMADol  Physical Exam Constitutional:      General: She is not in acute distress.    Appearance: She is ill-appearing.  Cardiovascular:     Rate and Rhythm: Normal rate.  Pulmonary:     Effort: Pulmonary effort is normal.  Neurological:     Mental Status: She is alert.     Comments: Left hemiplegia aphasia             Vital Signs: BP 116/81   Pulse 83   Temp 97.8 F (36.6 C) (Axillary)   Resp 20   Ht 5\' 2"  (1.575 m)   Wt 63.6 kg   SpO2 100%   BMI 25.65 kg/m  SpO2: SpO2: 100 % O2 Device: O2 Device: Room Air O2 Flow Rate:    Intake/output summary:   Intake/Output Summary (Last 24 hours) at 11/17/2020 1413 Last data filed at 11/17/2020 0536 Gross per 24 hour  Intake 500 ml  Output 1900 ml  Net -1400 ml   LBM: Last BM Date: 11/16/20 Baseline Weight: Weight: 54.9 kg Most recent weight: Weight: 63.6 kg       Palliative Assessment/Data: PPS 10%     Palliative Care Assessment & Plan   HPI/Patient Profile:74 y.o.femalewith past medical history of atrial fibrillation on Eliquis, insulin-dependent diabetes mellitus, hypertension, and recent CVA. [facility] of [facility] pain. ED Course:EKG shows a-fib with rapid rate (140's)- patient started on a diltiazem infusion.Chest x-ray negative for acute findings. CT abdomen/pelvis shows distended bladder with debris or hemorrhage within the bladder and significant volume loss of the left kidney likely reflecting prior infarction. Foley catheter was changed, urinalysis concerning  for infection.  Assessment: - septic shock secondary to UTI - paroxysmal A fib with RVR - history of CVA - dysphagia, dependent on PEG tube feeding - aphasia - left sided hemiplegia - anemia of chronic disease  Recommendations/Plan: - DNR/DNI as previously documented - restart Jevity 1.2 feeding per tube at 20 ml/hr (do not increase) - plan for discharge home tomorrow with hospice care  Goals of Care and Additional Recommendations:  Limitations on Scope of Treatment: Avoid Hospitalization  Code Status: DNR/DNI  Prognosis:   < 6 months  Discharge Planning:  Home with Hospice    Thank you for allowing the Palliative Medicine Team to assist in the care of this patient.   Total Time 35 minutes Prolonged Time Billed  no       Greater than 50%  of this time was spent counseling and coordinating care related to the above assessment and plan.  11/13/2020, NP  Please contact Palliative Medicine Team phone at [phone] for questions and concerns.

## 2020-11-18 LAB — GLUCOSE, CAPILLARY: Glucose-Capillary: 192 mg/dL — ABNORMAL HIGH (ref 70–99)

## 2020-11-18 NOTE — TOC Transition Note (Signed)
Transition of Care (TOC) - CM/SW Discharge Note Donn Pierini RN, BSN Transitions of Care Unit 4E- RN Case Manager See Treatment Team for direct phone #    Patient Details  Name: Samantha Paul MRN: [id number] Date of Birth: [birth year]  Transition [facility] CM/SW Contact:  Darrold Span, RN Phone Number: 11/18/2020, 2:03 PM   Clinical Narrative:    Pt stable for transition home today with home hospice - TF have been arranged by [facility] and bedside RN to send bottle home with pt. Updated paper work for transport- [facility] has been called for transport- [facility] to see pt in the home this afternoon.    Final next level of care: Home w Hospice Care Barriers to Discharge: Equipment Delay   Patient Goals and CMS Choice Patient states their goals for this hospitalization and ongoing recovery are:: home with hospice services CMS Medicare.gov Compare Post Acute Care list provided to:: Patient Choice offered to / list presented to :  (Hospice discussed agencies- family chose [facility]- agency reached out to family on Saturday and they are agreeable to services.)  Discharge Placement               Home with Hospice        Discharge Plan and Services In-house Referral: NA Discharge Planning Services: CM Consult Post Acute Care Choice: Hospice          DME Arranged: Hospital bed DME Agency: [facility] Date DME Agency Contacted: 11/15/20 Time DME Agency Contacted: [phone] Representative spoke with at DME Agency: Gillian Scarce HH Arranged: RN [facility] Agency: [facility] ([facility]) Date [facility] Agency Contacted: 11/15/20 Time HH Agency Contacted: [phone] Representative spoke with at [facility] Agency: Chrislyn King  Social Determinants of Health (SDOH) Interventions     Readmission Risk Interventions Readmission Risk Prevention Plan 11/16/2020 07/03/2020 07/03/2020  Post Dischage Appt - Complete -  Medication  Screening - Complete Complete  Transportation Screening Complete Complete Complete  Medication Review (RN Care Manager) Complete - -  PCP or Specialist appointment within 3-5 days of discharge Complete - -  HRI or Home Care Consult Complete - -  SW Recovery Care/Counseling Consult Complete - -  Palliative Care Screening Complete - -  Skilled Nursing Facility Not Applicable - -

## 2020-11-18 NOTE — Progress Notes (Signed)
Patient has been discharged with [facility]. All personal items have been taken by the patients husband. Discharge paperwork has been given to transport staff.   -Estella Husk, RN

## 2020-11-18 NOTE — Progress Notes (Signed)
Patient did not received morning medications on time from night shift RN due to different patient emergency. Heart rate elevated to 120, morning medication was given and heart rate returned to normal. Currently 70 bpm. Will continue to monitor.  -[name], RN

## 2020-11-18 NOTE — Progress Notes (Signed)
[facility] ([facility])  [facility] has tried several attempts to call family to initiate plans for discharge today. Unable to reach and did leave voicemail on phone. PMT Amil Amen and [facility] was able to talk to family at bedside and confirmed hospice will be coming out to visit at 330pm today along side an interpreter from [facility]. Plan is for feedings and pump to be delivered today and discharge home with hospice services. Lucila Maine is at home to accept delivery of pump.   [facility] information and contact numbers given to [facility]. Please feel free to call with any questions or concerns.   Thank you for the opportunity to participate in this patients care.   Yolande Jolly, BSN, [facility] [place] [phone]

## 2020-11-18 NOTE — Discharge Summary (Signed)
Discharge Summary  Samantha Paul ZSW:[id number] DOB: October 10, 1946  PCP: Elwyn Reach, MD  Admit date: 11/11/2020 Discharge date: 11/18/2020  Time spent: 35 minutes   Recommendations for Outpatient Follow-up:  1. Follow-up with hospice care.  Discharge Diagnoses:  Active Hospital Problems   Diagnosis Date Noted  . Atrial fibrillation with RVR ([place]) 11/11/2020  . Acute lower UTI 11/11/2020  . Dysphagia due to recent cerebrovascular accident   . Diabetes mellitus type 2 in nonobese (HCC)   . CVA (cerebral vascular accident) ([place]) 07/10/2018    Resolved Hospital Problems  No resolved problems to display.    Discharge Condition: Stable.  Diet recommendation: PEG tube feeding.  Vitals:   11/18/20 0840 11/18/20 0900  BP: (!) 123/105 (!) 123/105  Pulse: (!) 105 (!) 105  Resp: 20 20  Temp: 98.7 F (37.1 C) 98.7 F (37.1 C)  SpO2:  100%    History of present illness:  74 y.o.femalewith medical history significant foratrial fibrillation on Eliquis, insulin-dependent diabetes mellitus, hypertension, and history of unspecified CVA with aphasia, dysphagia status post PEG tube placement, neurogenic bladder status post indwelling foley catheter,and motor deficits presented from SNF for evaluation of abdominal pain. On presentation, she was slightly tachypneic, tachycardic in the 140s with stable blood pressure. Chest x-ray showed no acute infiltrates. CT of the abdomen and pelvis showed distended bladder with layering debris or hemorrhage within the urinary bladder and significant volume loss of the left kidney likely reflecting a prior infarction. Foley catheter was changed. UA was concerning for UTI. WBCs of 13,500. She was started on IV fluids, antibiotics and also started on Cardizem drip. Subsequently cardiology was consulted. Palliative care was also consulted for goals of care discussion.  After discussion with palliative care team, family has decided to proceed with with  home hospice.  Her husband would like to continue peg tube feedings.  Patient will be discharged to hospice once arrangements have been made.  Will DC most of the nonessential medications and focus more on quality of life and comfort.  Discussed with hospice care, Clementeen Hoof, via phone on 11/18/20.  Hospice care will take over this afternoon at 1530 at patient's home.  11/18/20: Seen and examined with her husband at her bedside.  She is stable for discharge to home with hospice care.  Hospital Course:  Principal Problem:   Atrial fibrillation with RVR (HCC) Active Problems:   CVA (cerebral vascular accident) ([place])   Diabetes mellitus type 2 in nonobese ([place])   Dysphagia due to recent cerebrovascular accident   Acute lower UTI  Septic shock: Present on admission, secondary to Proteus mirabilis UTI UTI, catheter associated -Patient presented with abdominal pain and was found to be tachypneic, tachycardic with leukocytosis, evidence of UTI and initial lactic acid of 4.3. -Treated with 5 days of IV Rocephin and subsequently discontinued.  Urine culture is growing Proteus mirabilis. Off IV fluids -Blood pressure currently stable.Sepsis has resolved. -Patient has indwelling Foley catheter which was changed in the ED.   Paroxysmal A. fib with RVR -Cardiology signed off on 11/12/2020. Cardizem drip has been switched to oral Cardizem.   Continue metoprolol and Cardizem upon discharge as this may help with rate control and prevent palpitations which might cause discomfort to the patient.  Will DC Eliquis as patient has been having hematuria during the hospitalization.  Leukocytosis -resolved  History of unspecified CVA with residual motor deficits, aphasia, dysphagia status post PEG tube placement -Patient has history of CVA with left-sided weakness, dysphagia  and aphasia and is dependent on PEG feedings.  -Palliative care had family meeting on 11/14/2020 and family has agreed for  discharge home with hospice. -Subsequently no more blood works or investigations have been done. -Care management team has been consulted for discharge planning.   Discharge home with hospice once arrangements have been made.  Mild hyponatremia Probable anemia of chronic disease -Recommend no more blood work upon discharge.  Diabetes mellitus type 2 with hyperglycemia -Resume home regimen  Discharge Instructions      Discharge Instructions    Discharge wound care:   Complete by: As directed    Apply topical zinc oxide twice daily to affected sacral area   Increase activity slowly   Complete by: As directed        Discharge Exam: BP (!) 123/105 (BP Location: Right Leg)   Pulse (!) 105   Temp 98.7 F (37.1 C) (Axillary)   Resp 20   Ht _0  (1.575 m)   Wt 63.6 kg   SpO2 100%   BMI 25.65 kg/m  . General: 74 y.o. year-old female well developed well nourished in no acute distress. . Cardiovascular: irregular rate and rhythm with no rubs or gallops.  No thyromegaly or JVD noted.   Marland Kitchen Respiratory: Clear to auscultation with no wheezes or rales. Good inspiratory effort. . Abdomen: Soft nontender nondistended with normal bowel sounds x4 quadrants.  Peg tube in place.  Discharge Instructions You were cared for by a hospitalist during your hospital stay. If you have any questions about your discharge medications or the care you received while you were in the hospital after you are discharged, you can call the unit and asked to speak with the hospitalist on call if the hospitalist that took care of you is not available. Once you are discharged, your primary care physician will handle any further medical issues. Please note that NO REFILLS for any discharge medications will be authorized once you are discharged, as it is imperative that you return to your primary care physician (or establish a relationship with a primary care physician if you do not have one) for your  aftercare needs so that they can reassess your need for medications and monitor your lab values.  Discharge Instructions    Discharge wound care:   Complete by: As directed    Apply topical zinc oxide twice daily to affected sacral area     Allergies as of 11/18/2020      Reactions   Metformin And Related Diarrhea   Abdominal pain and diarrhea      Medication List    STOP taking these medications   apixaban 5 MG Tabs tablet Commonly known as: ELIQUIS   blood glucose meter kit and supplies Kit   ferrous sulfate 300 (60 Fe) MG/5ML syrup   Florastor 250 MG capsule Generic drug: saccharomyces boulardii   gabapentin 100 MG capsule Commonly known as: NEURONTIN   insulin glargine 100 UNIT/ML injection Commonly known as: LANTUS   Lantus SoloStar 100 UNIT/ML Solostar Pen Generic drug: insulin glargine   methylphenidate 5 MG tablet Commonly known as: RITALIN   Pen Needles 32G X 4 MM Misc   PROSTAT PO   rosuvastatin 20 MG tablet Commonly known as: CRESTOR   senna 8.6 MG tablet Commonly known as: SENOKOT Replaced by: sennosides 8.8 MG/5ML syrup   Zinc Oxide 16 % Oint Replaced by: zinc oxide 20 % ointment     TAKE these medications   acetaminophen 325 MG tablet Commonly  known as: TYLENOL Place 650 mg into feeding tube every 8 (eight) hours as needed for moderate pain. What changed: Another medication with the same name was removed. Continue taking this medication, and follow the directions you see here.   bethanechol 10 MG tablet Commonly known as: URECHOLINE Place 1 tablet (10 mg total) into feeding tube 3 (three) times daily.   diclofenac Sodium 1 % Gel Commonly known as: VOLTAREN Apply 2 g topically in the morning, at noon, and at bedtime.   diltiazem 60 MG tablet Commonly known as: CARDIZEM Place 1 tablet (60 mg total) into feeding tube 3 (three) times daily. What changed: when to take this   feeding supplement (JEVITY 1.2 CAL) Liqd Place 20 mL/hr into  feeding tube continuous for 3 days. What changed:   how much to take  how fast to infuse this   free water Soln Place 100 mLs into feeding tube every 8 (eight) hours.   insulin aspart 100 UNIT/ML injection Commonly known as: novoLOG CBG 70 - 120: 0 units CBG 121 - 150: 1 unit CBG 151 - 200: 2 units CBG 201 - 250: 3 units CBG 251 - 300: 5 units CBG 301 - 350: 7 units CBG 351 - 400: 9 units What changed:   how much to take  how to take this  additional instructions   metFORMIN 500 MG tablet Commonly known as: Glucophage Take 1 tablet (500 mg total) by mouth 2 (two) times daily with a meal. What changed: how to take this   metoprolol tartrate 50 MG tablet Commonly known as: LOPRESSOR Place 1 tablet (50 mg total) into feeding tube 2 (two) times daily.   morphine 10 MG/5ML solution Take 2.5 mLs (5 mg total) by mouth every 2 (two) hours as needed for severe pain (dyspnea).   ondansetron 4 MG tablet Commonly known as: ZOFRAN Place 1 tablet (4 mg total) into feeding tube every 6 (six) hours as needed for nausea.   sennosides 8.8 MG/5ML syrup Commonly known as: SENOKOT Place 10 mLs into feeding tube at bedtime. Replaces: senna 8.6 MG tablet   traMADol 50 MG tablet Commonly known as: ULTRAM Place 1 tablet (50 mg total) into feeding tube every 6 (six) hours as needed. What changed:   when to take this  reasons to take this   zinc oxide 20 % ointment Apply topically 2 (two) times daily. Apply topically to affected sacral area Replaces: Zinc Oxide 16 % Oint            Discharge Care Instructions  (From admission, onward)         Start     Ordered   11/16/20 0000  Discharge wound care:       Comments: Apply topical zinc oxide twice daily to affected sacral area   11/16/20 1033         Allergies  Allergen Reactions  . Metformin And Related Diarrhea    Abdominal pain and diarrhea    Follow-up Information    Home hospice Follow up.   Why: At  earliest convenience       [facility] Follow up.   Why: Home Hospice arranged Contact information: [place] [phone]               The results of significant diagnostics from this hospitalization (including imaging, microbiology, ancillary and laboratory) are listed below for reference.    Significant Diagnostic Studies: CT ABDOMEN PELVIS W CONTRAST  Result  Date: 11/11/2020 CLINICAL DATA:  Nonlocalized abdominal pain EXAM: CT ABDOMEN AND PELVIS WITH CONTRAST TECHNIQUE: Multidetector CT imaging of the abdomen and pelvis was performed using the standard protocol following bolus administration of intravenous contrast. CONTRAST:  155m OMNIPAQUE IOHEXOL 300 MG/ML  SOLN COMPARISON:  08/14/2020 FINDINGS: Lower chest:  No contributory findings. Hepatobiliary: No focal liver abnormality.Cholecystectomy. No bile duct dilatation. Pancreas: Unremarkable. Spleen: Unremarkable. Adrenals/Urinary Tract: Negative adrenals. Significant decrease in size of the left kidney with no enhancement of the upper and interpolar region. Right renal cysts. Normal cortical enhancement on the right. No hydronephrosis. Distended bladder with dependent high-density material. A Foley catheter is in appropriate position. Stomach/Bowel: No obstruction. No appendicitis. Percutaneous gastrostomy tube in expected position Vascular/Lymphatic: No acute vascular abnormality. Mild atheromatous plaque. No mass or adenopathy. Reproductive:No pathologic findings. Other: No ascites or pneumoperitoneum. Musculoskeletal: No acute abnormalities. Lumbar spine degeneration with mild L4-5 anterolisthesis and L1-2 retrolisthesis. IMPRESSION: 1. Distended bladder despite a Foley catheter, please correlate with catheter function. 2. Layering debris or hemorrhage within the bladder lumen. 3. Significant volume loss of the left kidney since prior with primarily nonenhancing parenchyma. Prior  findings were likely related to infarct rather than infection. Electronically Signed   By: JMonte FantasiaM.D.   On: 11/11/2020 04:59   DG CHEST PORT 1 VIEW  Result Date: 11/13/2020 CLINICAL DATA:  74year old female with decreased breath sounds. EXAM: PORTABLE CHEST 1 VIEW COMPARISON:  Chest radiograph dated 11/11/2020. FINDINGS: There is diffuse chronic interstitial coarsening and mild bronchitic changes. No focal consolidation, pleural effusion or pneumothorax. Mild cardiomegaly. No acute osseous pathology. IMPRESSION: 1. No acute cardiopulmonary process. 2. Mild cardiomegaly. Electronically Signed   By: AAnner CreteM.D.   On: 11/13/2020 16:44   DG Chest Portable 1 View  Result Date: 11/11/2020 CLINICAL DATA:  Atrial fibrillation EXAM: PORTABLE CHEST 1 VIEW COMPARISON:  09/18/2020 FINDINGS: Lungs are clear. Mild cardiomegaly is stable. No pneumothorax or pleural effusion. Pulmonary vascularity is normal. No acute bone abnormality. IMPRESSION: No active disease.  Stable cardiomegaly. Electronically Signed   By: AFidela SalisburyMD   On: 11/11/2020 04:21    Microbiology: Recent Results (from the past 240 hour(s))  Respiratory Panel by RT PCR (Flu A&B, Covid) - Nasopharyngeal Swab     Status: None   Collection Time: 11/11/20  1:44 PM   Specimen: Nasopharyngeal Swab  Result Value Ref Range Status   SARS Coronavirus 2 by RT PCR NEGATIVE NEGATIVE Final    Comment: (NOTE) SARS-CoV-2 target nucleic acids are NOT DETECTED.  The SARS-CoV-2 RNA is generally detectable in upper respiratoy specimens during the acute phase of infection. The lowest concentration of SARS-CoV-2 viral copies this assay can detect is 131 copies/mL. A negative result does not preclude SARS-Cov-2 infection and should not be used as the sole basis for treatment or other patient management decisions. A negative result may occur with  improper specimen collection/handling, submission of specimen other than  nasopharyngeal swab, presence of viral mutation(s) within the areas targeted by this assay, and inadequate number of viral copies (<131 copies/mL). A negative result must be combined with clinical observations, patient history, and epidemiological information. The expected result is Negative.  Fact Sheet for Patients:  hPinkCheek.be Fact Sheet for Healthcare Providers:  hGravelBags.it This test is no t yet approved or cleared by the [place] and  has been authorized for detection and/or diagnosis of SARS-CoV-2 by FDA under an Emergency Use Authorization (EUA). This EUA will remain  in  effect (meaning this test can be used) for the duration of the COVID-19 declaration under Section 564(b)(1) of the Act, 21 U.S.C. section 360bbb-3(b)(1), unless the authorization is terminated or revoked sooner.     Influenza A by PCR NEGATIVE NEGATIVE Final   Influenza B by PCR NEGATIVE NEGATIVE Final    Comment: (NOTE) The Xpert Xpress SARS-CoV-2/FLU/RSV assay is intended as an aid in  the diagnosis of influenza from Nasopharyngeal swab specimens and  should not be used as a sole basis for treatment. Nasal washings and  aspirates are unacceptable for Xpert Xpress SARS-CoV-2/FLU/RSV  testing.  Fact Sheet for Patients: PinkCheek.be  Fact Sheet for Healthcare Providers: GravelBags.it  This test is not yet approved or cleared by the [place] FDA and  has been authorized for detection and/or diagnosis of SARS-CoV-2 by  FDA under an Emergency Use Authorization (EUA). This EUA will remain  in effect (meaning this test can be used) for the duration of the  Covid-19 declaration under Section 564(b)(1) of the Act, 21  U.S.C. section 360bbb-3(b)(1), unless the authorization is  terminated or revoked. Performed at [facility], [place] [address], [place],  [place] [address]   Culture, blood (routine x 2)     Status: None   Collection Time: 11/11/20  4:40 PM   Specimen: BLOOD RIGHT HAND  Result Value Ref Range Status   Specimen Description BLOOD RIGHT HAND  Final   Special Requests   Final    BOTTLES DRAWN AEROBIC ONLY Blood Culture results may not be optimal due to an inadequate volume of blood received in culture bottles   Culture   Final    NO GROWTH 5 DAYS Performed at [facility], [place] [address] [place], [place] [address]    Report Status 11/16/2020 FINAL  Final  Urine culture     Status: Abnormal   Collection Time: 11/11/20  5:46 PM   Specimen: Urine, Random  Result Value Ref Range Status   Specimen Description URINE, RANDOM  Final   Special Requests   Final    NONE Performed at [facility], [place] [address], [place], [place] [address]    Culture >=100,000 COLONIES/mL PROTEUS MIRABILIS (A)  Final   Report Status 11/13/2020 FINAL  Final   Organism ID, Bacteria PROTEUS MIRABILIS (A)  Final      Susceptibility   Proteus mirabilis - MIC*    AMPICILLIN <=2 SENSITIVE Sensitive     CEFAZOLIN <=4 SENSITIVE Sensitive     CEFEPIME <=0.12 SENSITIVE Sensitive     CEFTRIAXONE <=0.25 SENSITIVE Sensitive     CIPROFLOXACIN >=4 RESISTANT Resistant     GENTAMICIN <=1 SENSITIVE Sensitive     IMIPENEM 2 SENSITIVE Sensitive     NITROFURANTOIN 128 RESISTANT Resistant     TRIMETH/SULFA 40 SENSITIVE Sensitive     AMPICILLIN/SULBACTAM <=2 SENSITIVE Sensitive     PIP/TAZO <=4 SENSITIVE Sensitive     * >=100,000 COLONIES/mL PROTEUS MIRABILIS  Culture, blood (routine x 2)     Status: None   Collection Time: 11/11/20  7:46 PM   Specimen: BLOOD RIGHT HAND  Result Value Ref Range Status   Specimen Description BLOOD RIGHT HAND  Final   Special Requests   Final    AEROBIC BOTTLE ONLY Blood Culture results may not be optimal due to an inadequate volume of blood received in culture bottles   Culture   Final    NO GROWTH 5 DAYS Performed at  [facility], [address] [place], [place] [address]    Report Status 11/16/2020 FINAL  Final  MRSA PCR Screening     Status: None   Collection Time: 11/12/20  4:10 AM   Specimen: Nasal Mucosa; Nasopharyngeal  Result Value Ref Range Status   MRSA by PCR NEGATIVE NEGATIVE Final    Comment:        The GeneXpert MRSA Assay (FDA approved for NASAL specimens only), is one component of a comprehensive MRSA colonization surveillance program. It is not intended to diagnose MRSA infection nor to guide or monitor treatment for MRSA infections. Performed at [facility], [place] [address], [place], [place] [address]      Labs: Basic Metabolic Panel: Recent Labs  Lab 11/11/20 1926 11/12/20 0615 11/13/20 0154 11/14/20 0112 11/15/20 0226  NA 133* 134* 135 134*  --   K 4.8 4.0 4.5 4.6  --   CL 100 102 105 103  --   CO2 24 23 19* 22  --   GLUCOSE 105* 137* 178* 120*  --   BUN 33* 26* 26* 25*  --   CREATININE 0.74 0.79 0.85 0.81  --   CALCIUM 9.1 8.9 8.4* 8.7*  --   MG  --  2.0 1.9 1.9 1.9   Liver Function Tests: Recent Labs  Lab 11/11/20 1926  AST 33  ALT 22  ALKPHOS 56  BILITOT 0.9  PROT 6.3*  ALBUMIN 2.6*   Recent Labs  Lab 11/11/20 1926  LIPASE 30   No results for input(s): AMMONIA in the last 168 hours. CBC: Recent Labs  Lab 11/12/20 0615 11/13/20 0154 11/14/20 0112  WBC 5.2 8.2 7.3  HGB 10.3* 10.0* 9.5*  HCT 34.1* 33.1* 32.6*  MCV 81.2 81.5 82.5  PLT 166 184 165   Cardiac Enzymes: No results for input(s): CKTOTAL, CKMB, CKMBINDEX, TROPONINI in the last 168 hours. BNP: BNP (last 3 results) No results for input(s): BNP in the last 8760 hours.  ProBNP (last 3 results) No results for input(s): PROBNP in the last 8760 hours.  CBG: Recent Labs  Lab 11/16/20 0024 11/16/20 0429 11/16/20 0802 11/16/20 1235 11/16/20 1457  GLUCAP 241* 217* 171* 164* 154*       Signed:  Kayleen Memos, MD [facility] 11/18/2020, 11:48 AM

## 2020-11-18 NOTE — Progress Notes (Addendum)
Daily Progress Note   Patient Name: Samantha Paul       Date: 11/18/2020 DOB: [birth year]  Age: 74 y.o. MRN#: [id number] Attending Physician: Darlin Drop, DO Primary Care Physician: Rometta Emery, MD Admit Date: 11/11/2020  Reason for Follow-up: continued GOC discussion, disposition  Subjective: I have spoken with [facility] liaison - interpreter and hospice admission RN are planning to be at the patient's house today at 3:30.   Husband is at bedside. He reports the tube feeding pump has not been delivered to the house yet. He expresses concern that he will not be able to take care of her without it. He speaks of his duty to care for his wife to the best of his ability.  I provided reassurance that we will work together to ensure the pump is delivered in a timely manner.  Reviewed hospice philosophy with husband and provided reassurance that they will teach him how to manage the tube feeding and administer the medications.   Multiple conversations were had with TOC, hospice liaison, MD, and nursing -- coordinating care related to discharge planning.    Length of Stay: 7  Current Medications: Scheduled Meds:  . acetaminophen  650 mg Per Tube Q6H   Or  . acetaminophen  650 mg Rectal Q6H  . bethanechol  10 mg Per Tube TID  . chlorhexidine  15 mL Mouth Rinse BID  . Chlorhexidine Gluconate Cloth  6 each Topical Daily  . diclofenac Sodium  2 g Topical TID  . diltiazem  90 mg Per Tube Q8H  . feeding supplement (JEVITY 1.2 CAL)  1,000 mL Per Tube Q24H  . mouth rinse  15 mL Mouth Rinse q12n4p  . metoprolol tartrate  50 mg Per Tube BID  . sennosides  10 mL Per Tube QHS  . zinc oxide   Topical BID    Continuous Infusions:   PRN Meds: morphine injection, ondansetron **OR**  ondansetron (ZOFRAN) IV, traMADol  Physical Exam Vitals reviewed.  Constitutional:      General: She is not in acute distress.    Appearance: She is ill-appearing.  Cardiovascular:     Rate and Rhythm: Normal rate.     Comments: A-fib on monitor Pulmonary:     Effort: Pulmonary effort is normal.  Abdominal:     Comments: Tube feeding  via PEG  Neurological:     Mental Status: She is alert.             Vital Signs: BP (!) 123/105 (BP Location: Right Leg)   Pulse (!) 105   Temp 98.7 F (37.1 C) (Axillary)   Resp 20   Ht 5\' 2"  (1.575 m)   Wt 63.6 kg   SpO2 100%   BMI 25.65 kg/m  SpO2: SpO2: 100 % O2 Device: O2 Device: Room Air O2 Flow Rate:    Intake/output summary:   Intake/Output Summary (Last 24 hours) at 11/18/2020 1012 Last data filed at 11/18/2020 0920 Gross per 24 hour  Intake --  Output 350 ml  Net -350 ml   LBM: Last BM Date: 11/16/20 Baseline Weight: Weight: 54.9 kg Most recent weight: Weight: 63.6 kg       Palliative Assessment/Data: PPS 20-30%        Palliative Care Assessment & Plan   HPI/Patient Profile:74 y.o.femalewith past medical history of atrial fibrillation on Eliquis, insulin-dependent diabetes mellitus, hypertension, and recent CVA. She [facility] emergency [facility]with complaint of abdominal pain. ED Course:EKG shows a-fib with rapid rate (140's)- patient started on a diltiazem infusion.Chest x-ray negative for acute findings. CT abdomen/pelvis shows distended bladder with debris or hemorrhage within the bladder and significant volume loss of the left kidney likely reflecting prior infarction. Foley catheter was changed, urinalysis concerning for infection.  Assessment: - septic shock secondary to UTI - paroxysmal A fib with RVR - history of CVA - dysphagia, dependent on PEG tube feeding - aphasia - left sided hemiplegia - anemia of chronic disease  Recommendations/Plan: - DNR/DNI as  previously documented (gold form completed and placed on shadow chart) - continued Jevity 1.2 feeding per tube at 20 ml/hr  - please send a bottle of tube feeding with patient at discharge - discharge home  Today with hospice care  Code Status: DNR/DNI  Prognosis:   < 6 months  Discharge Planning:  Home with Hospice    Thank you for allowing the Palliative Medicine Team to assist in the care of this patient.   Total Time 35 minutes Prolonged Time Billed  no       Greater than 50%  of this time was spent counseling and coordinating care related to the above assessment and plan.  11/13/2020, NP  Please contact Palliative Medicine Team phone at [phone] for questions and concerns.

## 2020-11-23 DIAGNOSIS — Z7189 Other specified counseling: Secondary | ICD-10-CM

## 2020-11-25 ENCOUNTER — Inpatient Hospital Stay: Payer: Medicare Other | Admitting: Adult Health

## 2020-11-25 ENCOUNTER — Other Ambulatory Visit: Payer: Self-pay

## 2020-11-27 ENCOUNTER — Other Ambulatory Visit: Payer: Self-pay

## 2020-11-27 NOTE — Patient Outreach (Signed)
[facility]) Care Management  11/27/2020  Samantha Paul 07-Jan-1946 [id number]   Telephone outreach to patient to obtain mRS was successfully completed. MRS= 5.  Thank you, [name] Sarah [facility] Care Management Assistant

## 2020-12-07 DIAGNOSIS — Z7189 Other specified counseling: Secondary | ICD-10-CM

## 2020-12-26 DEATH — deceased

## 2021-02-13 IMAGING — XA IR PERCUTANEOUS ART THORMBECTOMY/INFUSION INTRACRANIAL INCLUDE D
10 of 14 series · 11 of 24 positions shown · IV contrast (IODINE)
Comparison: CT angiogram of the head and neck July 26, 2020.

INDICATION: Acute onset of left-sided hemiplegia, right gaze deviation and
slurred speech.

Occluded right internal carotid artery supraclinoid segment, right
middle cerebral artery and the right anterior cerebral artery.
EXAM:
1. EMERGENT LARGE VESSEL OCCLUSION THROMBOLYSIS (anterior
CIRCULATION)
TECHNIQUE: Following a full explanation of the procedure along with the
potential associated complications, an informed witnessed consent
was obtained the patient's spouse via an interpreter. The risks of
intracranial hemorrhage of 10%, worsening neurological deficit,
ventilator dependency, death and inability to revascularize were all
reviewed in detail with the patient's spouse.

[Series 2: cerebral · 2 acquisitions, 1 frame shown (1 of 10)]
[im 1/2]
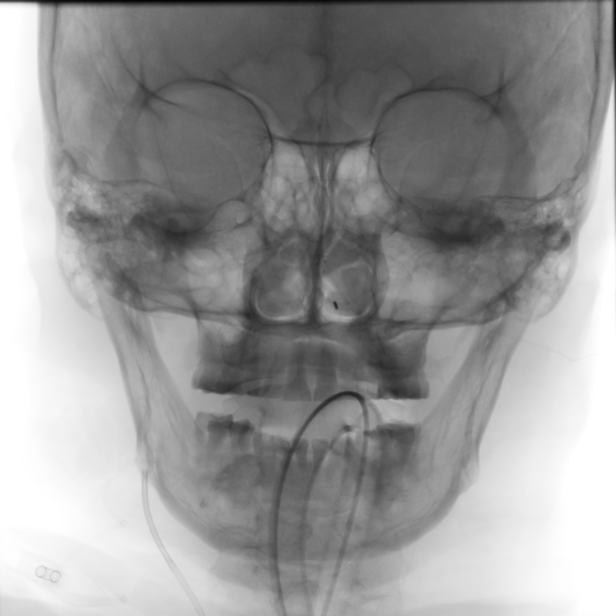

[Series 3: cerebral · 6 acquisitions, 1 frame shown (2 of 10)]
[im 6/6]
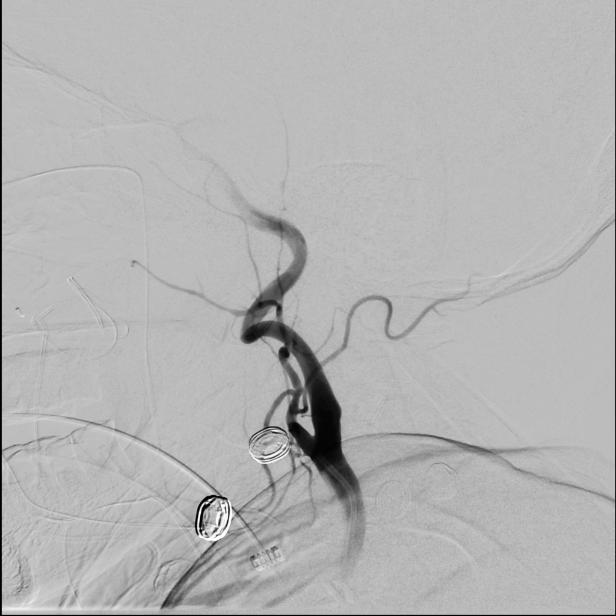

[Series 4: cerebral · 6 acquisitions, 1 frame shown (3 of 10)]
[im 6/6]
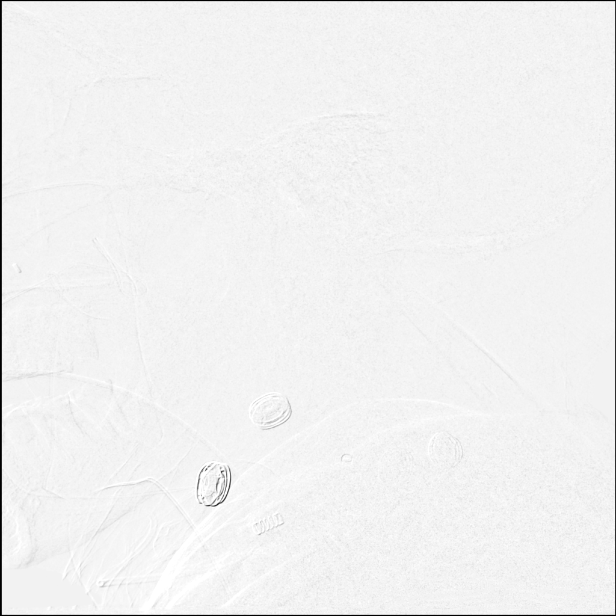

[Series 5: cerebral · 6 acquisitions, 1 frame shown (4 of 10)]
[im 6/6]
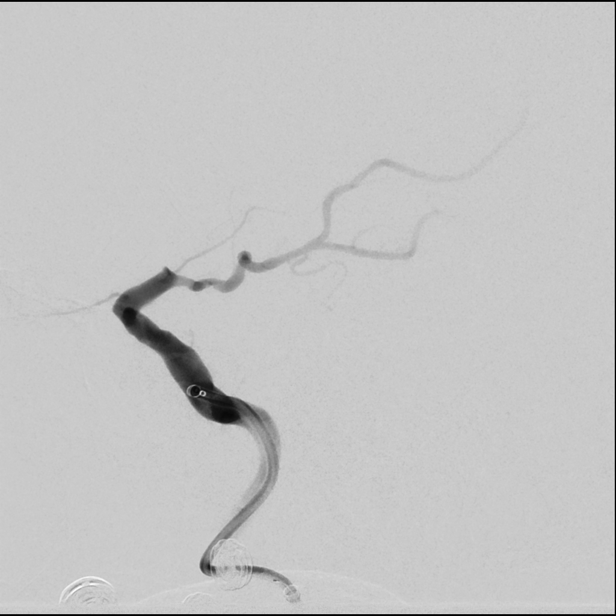

[Series 7: cerebral · 6 acquisitions, 1 frame shown (5 of 10)]
[im 6/6]
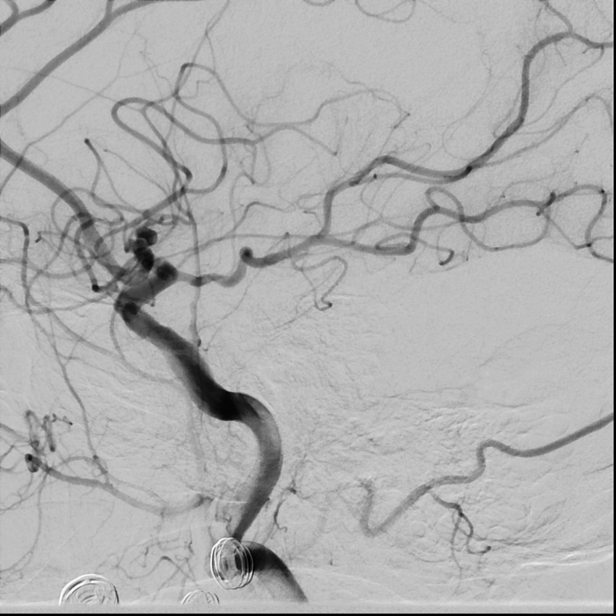

[Series 9: cerebral · 6 acquisitions, 1 frame shown (6 of 10)]
[im 1/6]
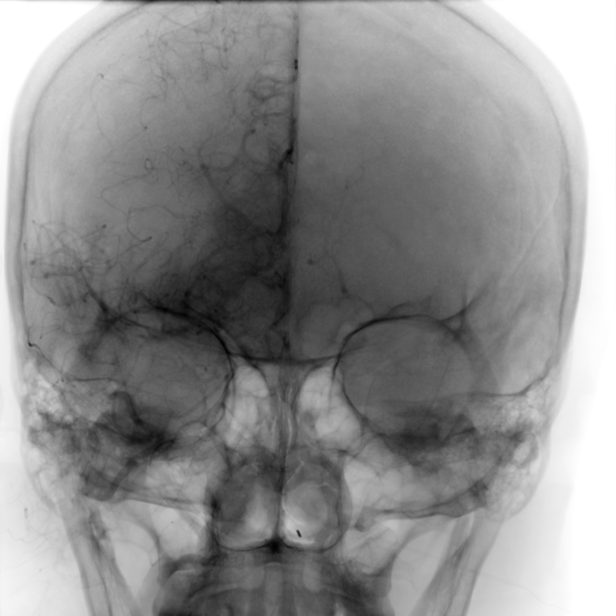

[Series 10: cerebral · 6 acquisitions, 1 frame shown (7 of 10)]
[im 1/6]
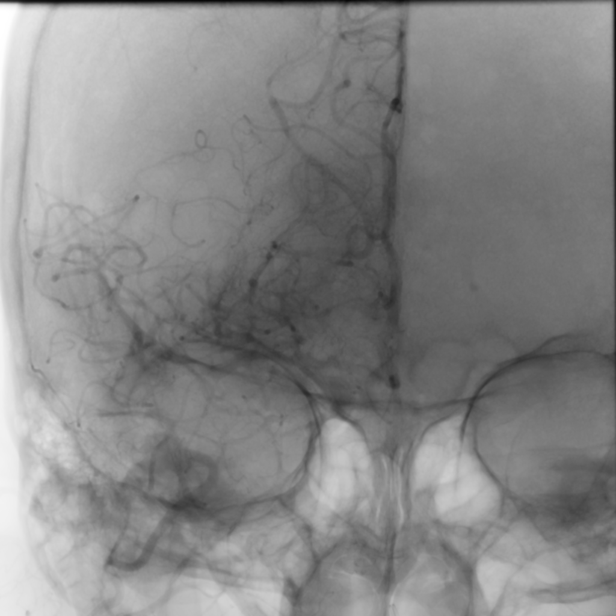

[Series 12: cerebral · 6 acquisitions, 1 frame shown (8 of 10)]
[im 1/6]
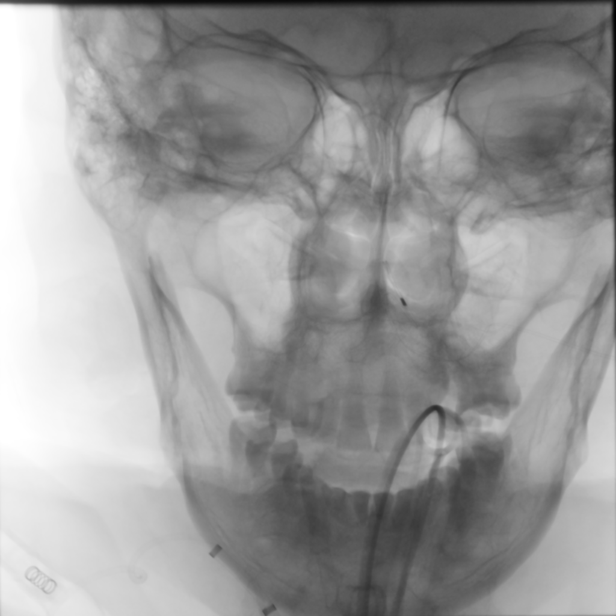

[Series 13: cerebral · 6 acquisitions, 2 frames shown (9 of 10)]
[im 1/6]
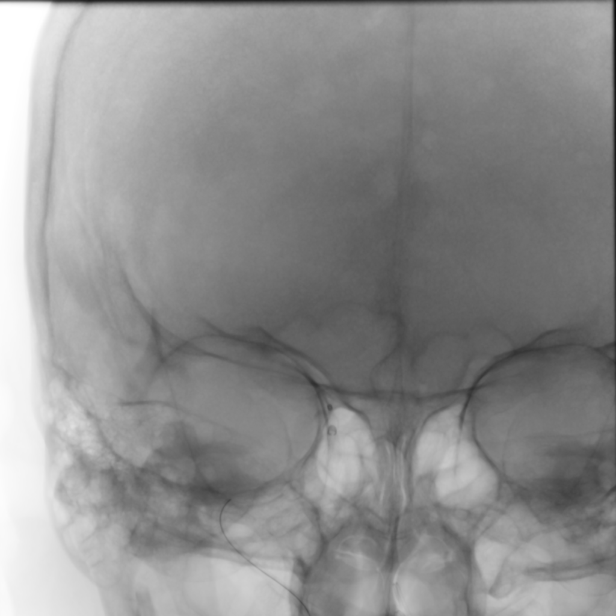
[im 6/6]
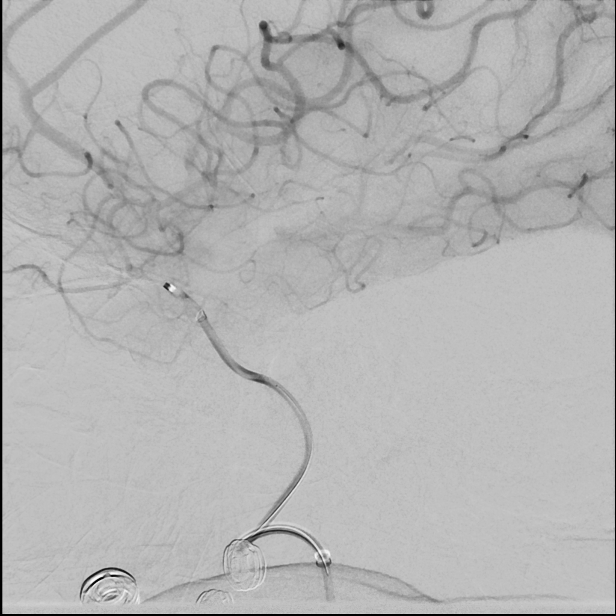

[Series 15: cerebral · 3 acquisitions, 1 frame shown (10 of 10)]
[im 1/3]
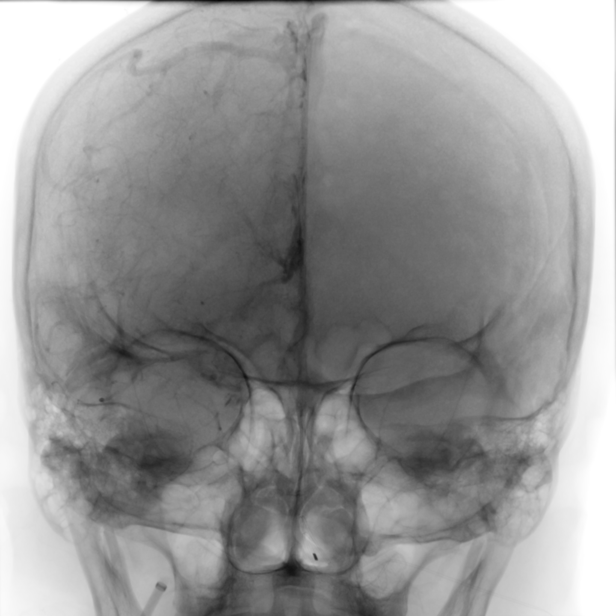

[11 of 24 positions shown; findings below may reference images not displayed]

MEDICATIONS:
Ancef 2 g IV antibiotic was administered within 1 hour of the
procedure.

ANESTHESIA/SEDATION:
General anesthesia

CONTRAST:  Isovue 300 approximately 100 mL

FLUOROSCOPY TIME:  Fluoroscopy Time: 38 minutes 36 seconds (9591
mGy).

COMPLICATIONS:
None immediate.
The patient was then put under general anesthesia by the [REDACTED] at [HOSPITAL].

The right groin was prepped and draped in the usual sterile fashion.
Thereafter using modified Seldinger technique, transfemoral access
into the right common femoral artery was obtained without
difficulty. Over a 0.035 inch guidewire a 5 French Pinnacle sheath
was inserted. Through this, and also over a 0.035 inch guidewire a 8
French 25 cm catheter was advanced to the aortic arch region and
selectively positioned in the innominate artery and right common
carotid artery.
FINDINGS: Innominate arteriogram demonstrates the origin of the right
subclavian artery and the right common carotid artery to be widely
patent.

Moderate tortuosity is seen of the right common carotid artery
proximally.

The right vertebral artery origin is widely patent.

The vessel is seen to opacify to the cranial skull base. The
opacified portion of the basilar artery on the AP projection
demonstrates patency with suggestion of a 50% stenosis of the right
vertebrobasilar junction.

The right common carotid arteriogram demonstrates the right external
carotid artery and its major branches to be widely patent.

The right internal carotid artery at the bulb to the cranial skull
base is patent with moderate severe tortuosity involving the mid
right M1 segment associated with a 50% stenosis.

The petrous and proximal cavernous segments are widely patent.

There is complete angiographic occlusion of the right internal
carotid artery just distal to the origin of the right posterior
communicating artery supplying the right posterior cerebral artery
distribution.

PROCEDURE:
The diagnostic JB 1 catheter in the right common carotid artery was
exchanged over a 0.035 inch 300 cm Rosen exchange guidewire for an
087 95 cm balloon guide catheter which had been prepped with 50%
contrast and 50% heparinized saline infusion.

Guidewire was removed. Good aspiration obtained from the hub of the
balloon guide catheter. A gentle contrast injection demonstrated no
evidence of vasospasm, or of intraluminal filling defects.

Over a 0.014 inch standard Synchro micro guidewire, an 027 160 cm
Marksman microcatheter inside of an 071 135 cm Zoom aspiration
catheter was advanced without difficulty to the distal cavernous
segment of the right internal carotid artery.

The micro guidewire was then gently manipulated without difficulty
and advanced into the inferior division of the right middle cerebral
artery M2 M3 region followed by the microcatheter. The guidewire was
removed. Mild resistance was again to aspiration which cleared with
more proximal position of the microcatheter. A gentle control
arteriogram performed through the microcatheter demonstrated safe
position of tip of the microcatheter, which was connected to
continuous heparinized saline infusion.

A 3 mm x 20 mm Solitaire X retrieval device was then advanced to the
distal end of the microcatheter. The O ring on the delivery
microcatheter was loosened. With slight forward gentle traction with
the right hand on the delivery micro guidewire with the left hand
the delivery microcatheter was retrieved unsheathing the retrieval
device.

The 071 aspiration catheter was engaged in the occluded right middle
cerebral artery M1 segment. Continued aspiration was applied using
an aspiration device with proximal flow arrest in the right internal
carotid artery, and aspiration using a 60 mL syringe at the hub of
the balloon guide catheter for approximately 2-1/2 minutes.

Thereafter, the combination of the retrieval device, the
microcatheter, aspiration catheter was retrieved and removed as
aspiration was applied at the hub of the balloon guide catheter.
Large clot and small pieces were seen from within the aspiration
catheter.

A control arteriogram performed through the balloon guide following
reversal of flow arrest in the right internal carotid artery
demonstrated wide patency of the superior and inferior divisions of
the right middle cerebral artery. A control arteriogram performed
following infusion of 25 mcg of nitroglycerin intra-arterially now
demonstrated significantly improved caliber and flow through the
superior and inferior divisions. There continued be slow flow
involving the M3 M4 branches of the anterior of peri insular
branches consistent with the site of the core seen on the CT
perfusion studies.

A TICI 2C revascularization of the right MCA distribution had been
achieved. The right anterior cerebral artery was now also widely
patent with a TICI 3 revascularization.

Patency of the right posterior communicating artery and the right
posterior cerebral artery territory was intact.

Control arteriogram performed through the balloon guide in the right
common carotid artery demonstrated free flow in the right internal
carotid artery extra cranially and intracranially.

This was then removed. The 8 French Pinnacle sheath in the right
groin was replaced with an 8 French Angio-Seal closure device for
hemostasis. The distal pulses remained Dopplerable in both the [place]
and posterior tibial arteries bilaterally unchanged.

Post procedure CT scan of the brain demonstrated contrast blush in
the right basal ganglia region. No gross mass effect or midline
shift was noted.

The patient was left intubated on account of the patient's
neurologic condition prior to intubation. She was then transported
to the neuro ICU for post thrombectomy care.
IMPRESSION: Status post endovascular revascularization of occluded right
internal carotid artery supraclinoid segment, the right middle
cerebral and the right anterior cerebral artery with 1 pass with a 3
mm x 20 mm Solitaire X retrieval device, contact aspiration and
proximal flow arrest achieving a TICI 2C revascularization.

PLAN:
Follow-up as per referring ORA.

## 2021-05-05 ENCOUNTER — Other Ambulatory Visit (HOSPITAL_COMMUNITY): Payer: Self-pay
